# Patient Record
Sex: Female | Born: 1955
Health system: Southern US, Community
[De-identification: ages and names within clinical notes are randomized; demographics above are authoritative.]

## PROBLEM LIST (undated history)

## (undated) DIAGNOSIS — K219 Gastro-esophageal reflux disease without esophagitis: Secondary | ICD-10-CM

## (undated) DIAGNOSIS — K3189 Other diseases of stomach and duodenum: Secondary | ICD-10-CM

## (undated) DIAGNOSIS — J449 Chronic obstructive pulmonary disease, unspecified: Secondary | ICD-10-CM

## (undated) DIAGNOSIS — F32A Depression, unspecified: Secondary | ICD-10-CM

## (undated) DIAGNOSIS — I5189 Other ill-defined heart diseases: Secondary | ICD-10-CM

## (undated) DIAGNOSIS — R1310 Dysphagia, unspecified: Secondary | ICD-10-CM

## (undated) DIAGNOSIS — K209 Esophagitis, unspecified without bleeding: Secondary | ICD-10-CM

## (undated) DIAGNOSIS — K279 Peptic ulcer, site unspecified, unspecified as acute or chronic, without hemorrhage or perforation: Secondary | ICD-10-CM

## (undated) DIAGNOSIS — I499 Cardiac arrhythmia, unspecified: Secondary | ICD-10-CM

## (undated) DIAGNOSIS — T07XXXA Unspecified multiple injuries, initial encounter: Secondary | ICD-10-CM

## (undated) DIAGNOSIS — D649 Anemia, unspecified: Secondary | ICD-10-CM

## (undated) DIAGNOSIS — M1990 Unspecified osteoarthritis, unspecified site: Secondary | ICD-10-CM

## (undated) DIAGNOSIS — K311 Adult hypertrophic pyloric stenosis: Secondary | ICD-10-CM

## (undated) DIAGNOSIS — M810 Age-related osteoporosis without current pathological fracture: Secondary | ICD-10-CM

## (undated) DIAGNOSIS — K449 Diaphragmatic hernia without obstruction or gangrene: Secondary | ICD-10-CM

## (undated) HISTORY — DX: Other ill-defined heart diseases: I51.89

## (undated) HISTORY — PX: TOTAL HIP ARTHROPLASTY: SHX124

## (undated) HISTORY — DX: Peptic ulcer, site unspecified, unspecified as acute or chronic, without hemorrhage or perforation: K27.9

## (undated) HISTORY — PX: APPENDECTOMY: SHX54

## (undated) HISTORY — PX: STOMACH SURGERY: SHX791

## (undated) HISTORY — PX: CHOLECYSTECTOMY: SHX55

## (undated) HISTORY — DX: Chronic obstructive pulmonary disease, unspecified: J44.9

---

## 2016-01-01 ENCOUNTER — Encounter (INDEPENDENT_AMBULATORY_CARE_PROVIDER_SITE_OTHER): Payer: Self-pay | Admitting: *Deleted

## 2016-01-19 ENCOUNTER — Ambulatory Visit (HOSPITAL_COMMUNITY)
Admission: RE | Admit: 2016-01-19 | Discharge: 2016-01-19 | Disposition: A | Discharge status: Home / Self Care | Payer: Self-pay | Source: Ambulatory Visit | Attending: Family | Admitting: Family

## 2016-01-19 ENCOUNTER — Other Ambulatory Visit (HOSPITAL_COMMUNITY): Payer: Self-pay | Admitting: Pulmonary Disease

## 2016-01-19 ENCOUNTER — Other Ambulatory Visit (HOSPITAL_COMMUNITY): Payer: Self-pay | Admitting: Family

## 2016-01-19 DIAGNOSIS — R634 Abnormal weight loss: Secondary | ICD-10-CM

## 2016-01-19 DIAGNOSIS — F17200 Nicotine dependence, unspecified, uncomplicated: Secondary | ICD-10-CM

## 2016-01-19 DIAGNOSIS — J449 Chronic obstructive pulmonary disease, unspecified: Secondary | ICD-10-CM | POA: Insufficient documentation

## 2016-01-19 DIAGNOSIS — Z720 Tobacco use: Secondary | ICD-10-CM | POA: Insufficient documentation

## 2016-02-03 ENCOUNTER — Encounter (INDEPENDENT_AMBULATORY_CARE_PROVIDER_SITE_OTHER): Payer: Self-pay | Admitting: Internal Medicine

## 2016-02-03 ENCOUNTER — Ambulatory Visit (INDEPENDENT_AMBULATORY_CARE_PROVIDER_SITE_OTHER): Payer: Self-pay | Admitting: Internal Medicine

## 2016-02-03 ENCOUNTER — Encounter (INDEPENDENT_AMBULATORY_CARE_PROVIDER_SITE_OTHER): Payer: Self-pay | Admitting: *Deleted

## 2016-02-03 ENCOUNTER — Other Ambulatory Visit (INDEPENDENT_AMBULATORY_CARE_PROVIDER_SITE_OTHER): Payer: Self-pay | Admitting: Internal Medicine

## 2016-02-03 VITALS — BP 112/58 | HR 68 | Temp 98.7°F | Ht 62.0 in | Wt <= 1120 oz

## 2016-02-03 DIAGNOSIS — R1319 Other dysphagia: Secondary | ICD-10-CM

## 2016-02-03 DIAGNOSIS — R634 Abnormal weight loss: Secondary | ICD-10-CM

## 2016-02-03 DIAGNOSIS — R1310 Dysphagia, unspecified: Secondary | ICD-10-CM

## 2016-02-03 NOTE — Patient Instructions (Addendum)
EGD/ED. The risks and benefits such as perforation, bleeding, and infection were reviewed with the patient and is agreeable. Stop the [employer].

## 2016-02-03 NOTE — Progress Notes (Signed)
   Subjective:    Patient ID: Kristine Roberts, female    DOB: 1956/11/06, 60 y.o.   MRN: [id number]  HPI Referred by Colvin Caroli FNP-C for dysphagia. She tells me she has trouble keeping food down . She feels like foods are lodging in her esophagus and then it will come back up. She says she can eat anything she wants without any problem but it comes back up. Symptoms x 3 years. Appetite is good. She has lost about 61 pounds over the past 3 years.  In thepast month she has lost about 9 pounds. She has a hx of PUD and she says she has stomach surgery 2001 or 2002 because of ulcers by Dr. Cleotis Nipper.  She has taken Marlin Canary Powder's for years (2 a day).  She usually has a BM daily. No melena or BRRB Patient has been evaluated by a psychiatrist for possible Bulimia and was referred here to rule out PUD Chrissie Noa at [facility]). 12/29/2015 H and H 13.3 and 40.7, MCV 89.6, Platelet ct 285, Albumin 3.5, ALP 55, AST 18, ALT 11       Review of Systems Past Medical History  Diagnosis Date  . PUD (peptic ulcer disease)     Past Surgical History  Procedure Laterality Date  . Appendectomy    . Cholecystectomy    . Stomach surgery      Allergies  Allergen Reactions  . Penicillins     Tongue swells, itching    No current outpatient prescriptions on file prior to visit.   No current facility-administered medications on file prior to visit.   Current Outpatient Prescriptions  Medication Sig Dispense Refill  . albuterol (PROVENTIL HFA;VENTOLIN HFA) 108 (90 Base) MCG/ACT inhaler Inhale into the lungs every 6 (six) hours as needed for wheezing or shortness of breath.    . Aspirin-Acetaminophen-Caffeine (GOODY HEADACHE PO) Take by mouth 2 (two) times daily.    Marland Kitchen FLUoxetine (PROZAC) 40 MG capsule Take 40 mg by mouth daily.    . Iron Combinations (CHROMAGEN) capsule Take 1 capsule by mouth daily. 45    . Multiple Vitamin (MULTIVITAMIN) tablet Take 1 tablet by mouth daily.    Marland Kitchen omeprazole (PRILOSEC) 20 MG  capsule Take 20 mg by mouth daily.    . Potassium 99 MG TABS Take by mouth.    . traZODone (DESYREL) 100 MG tablet Take 100 mg by mouth at bedtime.     No current facility-administered medications for this visit.        Objective:   Physical Exam Blood pressure 112/58, pulse 68, temperature 98.7 F (37.1 C), height 5\' 2"  (1.575 m), weight 67 lb 11.2 oz (30.709 kg). Alert and oriented. Skin warm and dry. Oral mucosa is moist.   . Sclera anicteric, conjunctivae is pink. Thyroid not enlarged. No cervical lymphadenopathy. Lungs clear. Heart regular rate and rhythm.  Abdomen is soft. Bowel sounds are positive. No hepatomegaly. No abdominal masses felt. No tenderness.  No edema to lower extremities.         Assessment & Plan:  Dysphagia. ? Etiology. ? Bulimia,.Keep appt with Psychiatrist.  Will get an EGD/ED. The risks and benefits such as perforation, bleeding, and infection were reviewed with the patient and is agreeable. Needs to stop the Goody Powders.

## 2016-03-03 ENCOUNTER — Encounter (HOSPITAL_COMMUNITY): Payer: Self-pay | Admitting: *Deleted

## 2016-03-03 ENCOUNTER — Inpatient Hospital Stay (HOSPITAL_COMMUNITY)
Admission: RE | Admit: 2016-03-03 | Discharge: 2016-03-06 | DRG: 380 | Disposition: A | Discharge status: Home / Self Care | Payer: Self-pay | Source: Ambulatory Visit | Attending: Internal Medicine | Admitting: Internal Medicine

## 2016-03-03 ENCOUNTER — Encounter (HOSPITAL_COMMUNITY)
Admission: RE | Disposition: A | Discharge status: Home / Self Care | Payer: Self-pay | Source: Ambulatory Visit | Attending: Internal Medicine

## 2016-03-03 DIAGNOSIS — K311 Adult hypertrophic pyloric stenosis: Principal | ICD-10-CM | POA: Diagnosis present

## 2016-03-03 DIAGNOSIS — K6389 Other specified diseases of intestine: Secondary | ICD-10-CM | POA: Diagnosis present

## 2016-03-03 DIAGNOSIS — F17210 Nicotine dependence, cigarettes, uncomplicated: Secondary | ICD-10-CM | POA: Diagnosis present

## 2016-03-03 DIAGNOSIS — E162 Hypoglycemia, unspecified: Secondary | ICD-10-CM | POA: Diagnosis present

## 2016-03-03 DIAGNOSIS — E876 Hypokalemia: Secondary | ICD-10-CM | POA: Diagnosis present

## 2016-03-03 DIAGNOSIS — R1319 Other dysphagia: Secondary | ICD-10-CM

## 2016-03-03 DIAGNOSIS — R1310 Dysphagia, unspecified: Secondary | ICD-10-CM

## 2016-03-03 DIAGNOSIS — E43 Unspecified severe protein-calorie malnutrition: Secondary | ICD-10-CM | POA: Insufficient documentation

## 2016-03-03 DIAGNOSIS — J439 Emphysema, unspecified: Secondary | ICD-10-CM

## 2016-03-03 DIAGNOSIS — Z803 Family history of malignant neoplasm of breast: Secondary | ICD-10-CM

## 2016-03-03 DIAGNOSIS — K3189 Other diseases of stomach and duodenum: Secondary | ICD-10-CM

## 2016-03-03 DIAGNOSIS — Z903 Acquired absence of stomach [part of]: Secondary | ICD-10-CM

## 2016-03-03 DIAGNOSIS — K449 Diaphragmatic hernia without obstruction or gangrene: Secondary | ICD-10-CM

## 2016-03-03 DIAGNOSIS — Z8711 Personal history of peptic ulcer disease: Secondary | ICD-10-CM

## 2016-03-03 DIAGNOSIS — K210 Gastro-esophageal reflux disease with esophagitis: Secondary | ICD-10-CM

## 2016-03-03 DIAGNOSIS — Z980 Intestinal bypass and anastomosis status: Secondary | ICD-10-CM

## 2016-03-03 DIAGNOSIS — Z808 Family history of malignant neoplasm of other organs or systems: Secondary | ICD-10-CM

## 2016-03-03 DIAGNOSIS — Z681 Body mass index (BMI) 19 or less, adult: Secondary | ICD-10-CM

## 2016-03-03 DIAGNOSIS — K289 Gastrojejunal ulcer, unspecified as acute or chronic, without hemorrhage or perforation: Secondary | ICD-10-CM | POA: Diagnosis present

## 2016-03-03 DIAGNOSIS — J449 Chronic obstructive pulmonary disease, unspecified: Secondary | ICD-10-CM

## 2016-03-03 HISTORY — PX: ESOPHAGEAL DILATION: SHX303

## 2016-03-03 HISTORY — PX: ESOPHAGOGASTRODUODENOSCOPY: SHX5428

## 2016-03-03 LAB — COMPREHENSIVE METABOLIC PANEL
ALBUMIN: 2.7 g/dL — AB (ref 3.5–5.0)
ALK PHOS: 42 U/L (ref 38–126)
ALT: 13 U/L — AB (ref 14–54)
AST: 18 U/L (ref 15–41)
Anion gap: 9 (ref 5–15)
BILIRUBIN TOTAL: 0.7 mg/dL (ref 0.3–1.2)
BUN: 15 mg/dL (ref 6–20)
CALCIUM: 7.9 mg/dL — AB (ref 8.9–10.3)
CO2: 29 mmol/L (ref 22–32)
CREATININE: 0.42 mg/dL — AB (ref 0.44–1.00)
Chloride: 100 mmol/L — ABNORMAL LOW (ref 101–111)
GFR calc Af Amer: >60 mL/min (ref >60)
GFR calc non Af Amer: >60 mL/min (ref >60)
GLUCOSE: 72 mg/dL (ref 65–99)
Potassium: 2.4 mmol/L — CL (ref 3.5–5.1)
SODIUM: 138 mmol/L (ref 135–145)
TOTAL PROTEIN: 5.5 g/dL — AB (ref 6.5–8.1)

## 2016-03-03 LAB — MAGNESIUM: Magnesium: 1.9 mg/dL (ref 1.7–2.4)

## 2016-03-03 SURGERY — EGD (ESOPHAGOGASTRODUODENOSCOPY)
Anesthesia: Moderate Sedation

## 2016-03-03 MED ORDER — ALBUTEROL SULFATE (2.5 MG/3ML) 0.083% IN NEBU: 2.5000 mg | INHALATION_SOLUTION | Freq: Four times a day (QID) | RESPIRATORY_TRACT | Status: DC | PRN

## 2016-03-03 MED ORDER — MIDAZOLAM HCL 5 MG/5ML IJ SOLN
INTRAMUSCULAR | Status: DC | PRN
  Administered 2016-03-03 (×2): 2 mg via INTRAVENOUS

## 2016-03-03 MED ORDER — ACETAMINOPHEN 325 MG PO TABS
650.0000 mg | ORAL_TABLET | Freq: Four times a day (QID) | ORAL | Status: DC | PRN
  Administered 2016-03-03: 650 mg via ORAL
  Filled 2016-03-03: qty 2

## 2016-03-03 MED ORDER — POTASSIUM CHLORIDE 10 MEQ/100ML IV SOLN
10.0000 meq | INTRAVENOUS | Status: AC
  Administered 2016-03-03 – 2016-03-04 (×6): 10 meq via INTRAVENOUS
  Filled 2016-03-03 (×5): qty 100

## 2016-03-03 MED ORDER — ONDANSETRON HCL 4 MG/2ML IJ SOLN: 4.0000 mg | Freq: Four times a day (QID) | INTRAMUSCULAR | Status: DC | PRN

## 2016-03-03 MED ORDER — ONDANSETRON HCL 4 MG PO TABS: 4.0000 mg | ORAL_TABLET | Freq: Four times a day (QID) | ORAL | Status: DC | PRN

## 2016-03-03 MED ORDER — ENOXAPARIN SODIUM 30 MG/0.3ML ~~LOC~~ SOLN
30.0000 mg | SUBCUTANEOUS | Status: DC
  Administered 2016-03-03: 30 mg via SUBCUTANEOUS
  Filled 2016-03-03: qty 0.3

## 2016-03-03 MED ORDER — MEPERIDINE HCL 50 MG/ML IJ SOLN
INTRAMUSCULAR | Status: DC | PRN
  Administered 2016-03-03 (×2): 25 mg via INTRAVENOUS

## 2016-03-03 MED ORDER — MEPERIDINE HCL 50 MG/ML IJ SOLN
INTRAMUSCULAR | Status: AC
  Filled 2016-03-03: qty 1

## 2016-03-03 MED ORDER — SODIUM CHLORIDE 0.9 % IV SOLN
INTRAVENOUS | Status: DC
  Administered 2016-03-03: 14:00:00 via INTRAVENOUS

## 2016-03-03 MED ORDER — SODIUM CHLORIDE 0.9 % IV SOLN
INTRAVENOUS | Status: DC
  Administered 2016-03-03: 21:00:00 via INTRAVENOUS

## 2016-03-03 MED ORDER — BUTAMBEN-TETRACAINE-BENZOCAINE 2-2-14 % EX AERO
INHALATION_SPRAY | CUTANEOUS | Status: DC | PRN
  Administered 2016-03-03: 2 via TOPICAL

## 2016-03-03 MED ORDER — PANTOPRAZOLE SODIUM 40 MG IV SOLR
40.0000 mg | Freq: Two times a day (BID) | INTRAVENOUS | Status: DC
  Administered 2016-03-03 – 2016-03-05 (×4): 40 mg via INTRAVENOUS
  Filled 2016-03-03 (×4): qty 40

## 2016-03-03 MED ORDER — ACETAMINOPHEN 650 MG RE SUPP: 650.0000 mg | Freq: Four times a day (QID) | RECTAL | Status: DC | PRN

## 2016-03-03 MED ORDER — MIDAZOLAM HCL 5 MG/5ML IJ SOLN
INTRAMUSCULAR | Status: AC
  Filled 2016-03-03: qty 10

## 2016-03-03 MED ORDER — ALBUTEROL SULFATE HFA 108 (90 BASE) MCG/ACT IN AERS: 2.0000 | INHALATION_SPRAY | Freq: Four times a day (QID) | RESPIRATORY_TRACT | Status: DC | PRN

## 2016-03-03 NOTE — Progress Notes (Signed)
Nursing report called to Johnsie Cancel, RN. Patient admitted to room 335.

## 2016-03-03 NOTE — H&P (Signed)
Kristine Roberts is an 60 y.o. female.   Chief Complaint: Patient is here for EGD and edition HPI: Patient is 60 year old Caucasian female who presents for several month history of dysphagia to solids. She also has been losing weight for the last 3 years. She denies hematemesis melena or rectal bleeding. She has intermittent epigastric pain. She states she has good appetite but she cannot eat. She has remote history of peptic ulcer disease for which she had surgery. She is still taking BC or Goody powder usually 2 a day. She smokes about a pack of cigarettes per day and drinks alcohol socially but not every day.  Past Medical History  Diagnosis Date  . PUD (peptic ulcer disease)   . COPD (chronic obstructive pulmonary disease) [facility])     Past Surgical History  Procedure Laterality Date  . Appendectomy    . Cholecystectomy    . Stomach surgery      History reviewed. No pertinent family history. Social History:  reports that she has been smoking Cigarettes.  She has smoked for the past 20 years. She does not have any smokeless tobacco history on file. She reports that she drinks about 7.2 oz of alcohol per week. She reports that she does not use illicit drugs.  Allergies:  Allergies  Allergen Reactions  . Penicillins     Tongue swells, itching Has patient had a PCN reaction causing immediate rash, facial/tongue/throat swelling, SOB or lightheadedness with hypotension: Yes, had a rash and tongue swelled up. No SOB and lightheadedness. Has patient had a PCN reaction causing severe rash involving mucus membranes or skin necrosis: No Has patient had a PCN reaction that required hospitalization: No Has patient had a PCN reaction occurring within the last 10 years: No If all of the above answers are "NO", then may procee    Medications Prior to Admission  Medication Sig Dispense Refill  . albuterol (PROVENTIL HFA;VENTOLIN HFA) 108 (90 Base) MCG/ACT inhaler Inhale into the lungs every 6 (six)  hours as needed for wheezing or shortness of breath.    . Aspirin-Acetaminophen-Caffeine (GOODY HEADACHE PO) Take 1 tablet by mouth 2 (two) times daily.     Marland Kitchen FLUoxetine (PROZAC) 40 MG capsule Take 40 mg by mouth daily.    Marland Kitchen omeprazole (PRILOSEC) 20 MG capsule Take 20 mg by mouth daily.    . Potassium 99 MG TABS Take 99 mg by mouth daily.     Marland Kitchen tetrahydrozoline 0.05 % ophthalmic solution Place 1 drop into both eyes daily as needed (dry eyes).    . traZODone (DESYREL) 100 MG tablet Take 100 mg by mouth at bedtime.    . Iron Combinations (CHROMAGEN) capsule Take 1 capsule by mouth daily. 45    . Multiple Vitamin (MULTIVITAMIN) tablet Take 1 tablet by mouth daily.      No results found for this or any previous visit (from the past 48 hour(s)). No results found.  ROS  Blood pressure 103/70, pulse 65, temperature 98.6 F (37 C), temperature source Oral, resp. rate 12, height 5\' 2"  (1.575 m), weight 67 lb (30.391 kg), SpO2 100 %. Physical Exam  Constitutional:  Patient is emaciated.  HENT:  Mouth/Throat: Oropharynx is clear and moist.  Patient is edentulous.  Eyes: No scleral icterus.  Neck: No thyromegaly present.  Cardiovascular: Normal rate, regular rhythm and normal heart sounds.   No murmur heard. Respiratory: Effort normal and breath sounds normal.  GI:  Scaphoid abdomen with right subcostal and midline scar and a  palpable suture. Mild tenderness noted epigastrium to the right of midline.  Musculoskeletal: She exhibits no edema.  Lymphadenopathy:    She has no cervical adenopathy.  Neurological: She is alert.  Skin: Skin is warm and dry.     Assessment/Plan Solid food dysphagia. 30 pound weight loss. She is emaciated. EGD and ED.  Malissa Hippo, MD 03/03/2016, 3:37 PM

## 2016-03-03 NOTE — H&P (Signed)
PCP:   Jerrell Belfast, FNP   Chief Complaint:  Dysphagia  HPI:  60 yr old female who   has a past medical history of PUD (peptic ulcer disease) and COPD (chronic obstructive pulmonary disease) (HCC). today was sent to the hospital from endoscopy suite, where patient underwent EGD for chronic dysphagia and weight loss. EGD revealed extensive changes of erosive esophagitis involving distal two thirds of esophagus, small sliding hiatal hernia, gastrojejunostomy with high-grade anastomotic stricture with circumferential ulcer and stomach full of food debris. As per patient she has been having symptoms of dysphagia for past 2 years, she was able to swallow liquid diet most of the times but regurgitated solid food most of the time. She has lost considerable weight and only weighs 63 pounds. She denies chest pain or shortness of breath. She does smoke one pill of cigarettes a day. Denies any abdominal pain. No diarrhea. No fever or chills. No dysuria urgency frequency of urination.   Allergies:   Allergies  Allergen Reactions  . Penicillins           Past Medical History  Diagnosis Date  . PUD (peptic ulcer disease)   . COPD (chronic obstructive pulmonary disease) [facility])     Past Surgical History  Procedure Laterality Date  . Appendectomy    . Cholecystectomy    . Stomach surgery      Prior to Admission medications   Medication Sig Start Date End Date Taking? Authorizing Provider  albuterol (PROVENTIL HFA;VENTOLIN HFA) 108 (90 Base) MCG/ACT inhaler Inhale into the lungs every 6 (six) hours as needed for wheezing or shortness of breath.   Yes Historical Provider, MD  Aspirin-Acetaminophen-Caffeine (GOODY HEADACHE PO) Take 1 tablet by mouth 2 (two) times daily.    Yes Historical Provider, MD  FLUoxetine (PROZAC) 40 MG capsule Take 40 mg by mouth daily.   Yes Historical Provider, MD  omeprazole (PRILOSEC) 20 MG capsule Take 20 mg by mouth daily.   Yes Historical Provider, MD    Potassium 99 MG TABS Take 99 mg by mouth daily.    Yes Historical Provider, MD  tetrahydrozoline 0.05 % ophthalmic solution Place 1 drop into both eyes daily as needed (dry eyes).   Yes Historical Provider, MD  traZODone (DESYREL) 100 MG tablet Take 100 mg by mouth at bedtime.   Yes Historical Provider, MD  Iron Combinations (CHROMAGEN) capsule Take 1 capsule by mouth daily. 45    Historical Provider, MD  Multiple Vitamin (MULTIVITAMIN) tablet Take 1 tablet by mouth daily.    Historical Provider, MD    Social History:  reports that she has been smoking Cigarettes.  She has smoked for the past 20 years. She does not have any smokeless tobacco history on file. She reports that she drinks about 7.2 oz of alcohol per week. She reports that she does not use illicit drugs.  Patient's father had throat cancer, sister had breast cancer  Filed Weights   03/03/16 1326 03/03/16 1741  Weight: 30.391 kg (67 lb) 28.577 kg (63 lb)    All the positives are listed in BOLD  Review of Systems:  HEENT: Headache, blurred vision, runny nose, sore throat Neck: Hypothyroidism, hyperthyroidism,,lymphadenopathy Chest : Shortness of breath, history of COPD, Asthma Heart : Chest pain, history of coronary arterey disease GI:  Nausea, vomiting, diarrhea, constipation, GERD GU: Dysuria, urgency, frequency of urination, hematuria Neuro: Stroke, seizures, syncope Psych: Depression, anxiety, hallucinations   Physical Exam: Blood pressure 88/53, pulse 57, temperature 98.6  F (37 C), temperature source Oral, resp. rate 14, height 5\' 2"  (1.575 m), weight 28.577 kg (63 lb), SpO2 95 %. Constitutional:   Patient is a well-developed and well-nourished female in no acute distress and cooperative with exam. Head: Normocephalic and atraumatic Mouth: Mucus membranes moist Eyes: PERRL, EOMI, conjunctivae normal Neck: Supple, No Thyromegaly Cardiovascular: RRR, S1 normal, S2 normal Pulmonary/Chest: CTAB, no wheezes,  rales, or rhonchi Abdominal: Soft. Non-tender, non-distended, bowel sounds are normal, no masses, organomegaly, or guarding present.  Neurological: A&O x3, Strength is normal and symmetric bilaterally, cranial nerve II-XII are grossly intact, no focal motor deficit, sensory intact to light touch bilaterally.  Extremities : No Cyanosis, Clubbing or Edema   Assessment/Plan Active Problems:   Dysphagia   Gastric outlet obstruction   COPD (chronic obstructive pulmonary disease) (HCC)   Dysphagia Patient has gastric outlet obstruction, GI he has been following. Plan is to keep patient nothing by mouth, NG tube with low intermittent suction. GI to follow in a.m. For Gastrografin study via NG tube to determine if anastomotic stricture could be be dilated endoscopically.  COPD  Stable, no exacerbation at this time. Continue albuterol when necessary  DVT prophylaxis Lovenox   Code status: Full code  Family discussion: No family present at bedside  Time Spent on Admission: 60 min  [facility] Pager: [phone] 03/03/2016, 6:21 PM  If 7PM-7AM, please contact night-coverage  [email or web address]  Password [email or web address]

## 2016-03-03 NOTE — Progress Notes (Signed)
Patient underwent EGD for chronic dysphagia and weight loss EGD reveals extensive changes of erosive esophagitis involving distal two thirds of the esophagus, small sliding hiatal hernia, gastrojejunostomy with high-grade anastomotic stricture with circumferential ulcer and stomach full of food debris some of which was suctioned out. Patient has been admitted to hospitalist service.  Recommendations:  NG suction to low intermittent pressure> Pantoprazole 40 mg IV every 12 hours. Gastrografin study via NG tube tomorrow to determine if anastomotic stricture could be dilated endoscopically.  Discussed with Dr. Sharl Ma.

## 2016-03-03 NOTE — Progress Notes (Signed)
Patient has requested her trazodone that she takes at home for sleep, paged on call MD, will follow any new orders received and continue to monitor the patient.

## 2016-03-03 NOTE — Op Note (Signed)
[facility] Patient Name: Kristine Roberts Procedure Date: 03/03/2016 3:31 PM MRN: [id number] Date of Birth: 29-Jan-1956 Attending MD: Lionel December , MD CSN: [id number] Age: 60 Admit Type: Outpatient Procedure:                Upper GI endoscopy Indications:              Esophageal dysphagia, Weight loss Providers:                Lionel December, MD, Jannett Celestine, RN, Burke Keels,                            Technician Referring MD:             Selinda Flavin, M.D [facility] health                            Department). Medicines:                Cetacaine spray, Meperidine 50 mg IV, Midazolam 4                            mg IV Complications:            No immediate complications. Estimated Blood Loss:     Estimated blood loss: none. Procedure:                Pre-Anesthesia Assessment:                           - Prior to the procedure, a History and Physical                            was performed, and patient medications and                            allergies were reviewed. The patient's tolerance of                            previous anesthesia was also reviewed. The risks                            and benefits of the procedure and the sedation                            options and risks were discussed with the patient.                            All questions were answered, and informed consent                            was obtained. Prior Anticoagulants: The patient                            last took previous NSAID medication 1 day prior to  the procedure. ASA Grade Assessment: III - A                            patient with severe systemic disease. After                            reviewing the risks and benefits, the patient was                            deemed in satisfactory condition to undergo the                            procedure.                           After obtaining informed consent, the endoscope was                             passed under direct vision. Throughout the                            procedure, the patient's blood pressure, pulse, and                            oxygen saturations were monitored continuously. The                            EG-[id number] ([id number]) scope was introduced through the                            mouth, and advanced to the prepyloric region,                            stomach. The EG-[id number] ([id number]) scope was                            introduced through the mouth, and advanced to the.                            The upper GI endoscopy was accomplished without                            difficulty. The patient tolerated the procedure                            well. Scope In: 3:49:34 PM Scope Out: 4:01:13 PM Total Procedure Duration: 0 hours 11 minutes 39 seconds  Findings:      The upper third of the esophagus was normal.      LA Grade D (one or more mucosal breaks involving at least 75% of       esophageal circumference) esophagitis with no bleeding was found 24 to       33 cm from the incisors.      A 3 cm hiatal hernia was present.      A medium amount  of food (residue) was found in the gastric fundus, in       the gastric body and in the gastric antrum.      One non-bleeding circumferential gastric ulcer with no stigmata of       bleeding was found at the anastomosis.      Evidence of a stenosed Billroth I gastroduodenostomy was found. A       gastric pouch greater than 10 cm wide with a large size was found       containing food debris. The gastroduodenal anastomosis was characterized       by friable mucosa, severe stenosis and ulceration. This could not be       traversed.      An examination of the duodenum was not performed. Impression:               - Normal upper third of esophagus.                           - LA Grade D reflux esophagitis.                           - 3 cm hiatal hernia.                           - A medium amount of food (residue) in the  stomach.                           - Stenosed Billroth I gastroduodenostomy was found,                            characterized by friable mucosa, ulceration and                            severe stenosis.                           -could not traverse stricture with pediatric                            endoscope.                           - No specimens collected. Moderate Sedation:      Moderate (conscious) sedation was administered by the endoscopy nurse       and supervised by the endoscopist. The following parameters were       monitored: oxygen saturation, heart rate, blood pressure, CO2       capnography and response to care. Total physician intraservice time was       15 minutes. Recommendation:           - Admit the patient to hospital ward for ongoing                            care.                           - Make the patient NPO starting today.                           -  Discontinue aspirin and NSAIDs indefinitely. Procedure Code(s):        --- Professional ---                           [phone], Esophagogastroduodenoscopy, flexible,                            transoral; diagnostic, including collection of                            specimen(s) by brushing or washing, when performed                            (separate procedure)                           99152, Moderate sedation services provided by the                            same physician or other qualified health care                            professional performing the diagnostic or                            therapeutic service that the sedation supports,                            requiring the presence of an independent trained                            observer to assist in the monitoring of the                            patient's level of consciousness and physiological                            status; initial 15 minutes of intraservice time,                            patient age 44 years or  older Diagnosis Code(s):        --- Professional ---                           K21.0, Gastro-esophageal reflux disease with                            esophagitis                           K44.9, Diaphragmatic hernia without obstruction or                            gangrene  Z98.0, Intestinal bypass and anastomosis status                           R13.14, Dysphagia, pharyngoesophageal phase                           R63.4, Abnormal weight loss CPT copyright 2016 American Medical Association. All rights reserved. The codes documented in this report are preliminary and upon coder review may  be revised to meet current compliance requirements. Lionel December, MD Lionel December, MD 03/03/2016 4:33:49 PM This report has been signed electronically. Number of Addenda: 0

## 2016-03-04 ENCOUNTER — Observation Stay (HOSPITAL_COMMUNITY): Payer: Self-pay

## 2016-03-04 DIAGNOSIS — E43 Unspecified severe protein-calorie malnutrition: Secondary | ICD-10-CM

## 2016-03-04 DIAGNOSIS — R1310 Dysphagia, unspecified: Secondary | ICD-10-CM

## 2016-03-04 DIAGNOSIS — K311 Adult hypertrophic pyloric stenosis: Principal | ICD-10-CM

## 2016-03-04 DIAGNOSIS — J449 Chronic obstructive pulmonary disease, unspecified: Secondary | ICD-10-CM

## 2016-03-04 LAB — COMPREHENSIVE METABOLIC PANEL
ALT: 12 U/L — AB (ref 14–54)
AST: 19 U/L (ref 15–41)
Albumin: 2.8 g/dL — ABNORMAL LOW (ref 3.5–5.0)
Alkaline Phosphatase: 43 U/L (ref 38–126)
Anion gap: 11 (ref 5–15)
BUN: 12 mg/dL (ref 6–20)
CALCIUM: 8.2 mg/dL — AB (ref 8.9–10.3)
CO2: 26 mmol/L (ref 22–32)
CREATININE: 0.38 mg/dL — AB (ref 0.44–1.00)
Chloride: 102 mmol/L (ref 101–111)
GFR calc Af Amer: >60 mL/min (ref >60)
GLUCOSE: 58 mg/dL — AB (ref 65–99)
Potassium: 4 mmol/L (ref 3.5–5.1)
Sodium: 139 mmol/L (ref 135–145)
TOTAL PROTEIN: 5.6 g/dL — AB (ref 6.5–8.1)
Total Bilirubin: 0.6 mg/dL (ref 0.3–1.2)

## 2016-03-04 LAB — CBC
HCT: 38.2 % (ref 36.0–46.0)
Hemoglobin: 12.4 g/dL (ref 12.0–15.0)
MCH: 30.9 pg (ref 26.0–34.0)
MCHC: 32.5 g/dL (ref 30.0–36.0)
MCV: 95.3 fL (ref 78.0–100.0)
PLATELETS: 232 10*3/uL (ref 150–400)
RBC: 4.01 MIL/uL (ref 3.87–5.11)
RDW: 15.9 % — ABNORMAL HIGH (ref 11.5–15.5)
WBC: 6.6 10*3/uL (ref 4.0–10.5)

## 2016-03-04 LAB — GLUCOSE, CAPILLARY
GLUCOSE-CAPILLARY: 115 mg/dL — AB (ref 65–99)
GLUCOSE-CAPILLARY: 81 mg/dL (ref 65–99)
Glucose-Capillary: 175 mg/dL — ABNORMAL HIGH (ref 65–99)
Glucose-Capillary: 45 mg/dL — ABNORMAL LOW (ref 65–99)

## 2016-03-04 MED ORDER — DEXTROSE-NACL 5-0.9 % IV SOLN
INTRAVENOUS | Status: DC
  Administered 2016-03-04 (×2): via INTRAVENOUS

## 2016-03-04 MED ORDER — IOPAMIDOL (ISOVUE-300) INJECTION 61%
INTRAVENOUS | Status: AC
  Filled 2016-03-04: qty 300

## 2016-03-04 MED ORDER — DEXTROSE 50 % IV SOLN
INTRAVENOUS | Status: AC
  Filled 2016-03-04: qty 50

## 2016-03-04 MED ORDER — MORPHINE SULFATE (PF) 2 MG/ML IV SOLN
0.5000 mg | INTRAVENOUS | Status: DC | PRN
  Administered 2016-03-04 – 2016-03-06 (×9): 0.5 mg via INTRAVENOUS
  Filled 2016-03-04 (×9): qty 1

## 2016-03-04 MED ORDER — DEXTROSE 50 % IV SOLN
1.0000 | Freq: Once | INTRAVENOUS | Status: AC
  Administered 2016-03-04: 50 mL via INTRAVENOUS

## 2016-03-04 MED ORDER — IOPAMIDOL (ISOVUE-300) INJECTION 61%
150.0000 mL | Freq: Once | INTRAVENOUS | Status: AC | PRN
  Administered 2016-03-04: 120 mL via INTRAVENOUS

## 2016-03-04 NOTE — Progress Notes (Signed)
Patient ID: Kristine Roberts, female   DOB: 1956-07-05, 60 y.o.   MRN: [id number] Alert. NG in place. Tolerating clear liquids. No vomiting. Underwent an EGD yesterday   Gatro. Study thru tube ordered this am. Repeat EGD tomorrow. Marland Kitchen

## 2016-03-04 NOTE — Care Management Note (Signed)
Case Management Note  Patient Details  Name: Kristine Roberts MRN: [id number] Date of Birth: August 16, 1956  Subjective/Objective:     Spoke with patient who is from home with boyfriend, who drives her to her appointments. Patient stated that she is only on 3 medications and that she can afford these. Lives in 1 level house. [facility] she sees Dr Collins [place].  Patietn denies the use of DME at home and stated that she is "steady on her feet".   May need help with medications at discharge. Not on O2.               Action/Plan:  Home with self care.    Expected Discharge Date:                  Expected Discharge Plan:  Home/Self Care  In-House Referral:  Financial Counselor  Discharge planning Services  CM Consult  Post Acute Care Choice:    Choice offered to:     DME Arranged:    DME Agency:     HH Arranged:    HH Agency:     Status of Service:  In process, will continue to follow  Medicare Important Message Given:    Date Medicare IM Given:    Medicare IM give by:    Date Additional Medicare IM Given:    Additional Medicare Important Message give by:     If discussed at Long Length of Stay Meetings, dates discussed:    Additional Comments:  Adonis Huguenin, RN 03/04/2016, 9:16 AM

## 2016-03-04 NOTE — Progress Notes (Signed)
[facility] PROGRESS NOTE  Kristine Roberts VOZ:[id number] DOB: [birth year] DOA: 03/03/2016 PCP: Jerrell Belfast, FNP  Assessment/Plan: 60 yr old female who  has a past medical history of PUD (peptic ulcer disease) and COPD (chronic obstructive pulmonary disease) (HCC). today was sent to the hospital from endoscopy suite, where patient underwent EGD for chronic dysphagia and weight loss. EGD revealed extensive changes of erosive esophagitis involving distal two thirds of esophagus, small sliding hiatal hernia, gastrojejunostomy with high-grade anastomotic stricture with circumferential ulcer and stomach full of food debris.  Dysphagia; S/P endoscopy 3-29: - Normal upper third of esophagus.  LA Grade D reflux esophagitis. - 3 cm hiatal hernia. - A medium amount of food (residue) in the stomach. - Stenosed Billroth I gastroduodenostomy was found, characterized by friable mucosa, ulceration and severe stenosis.  -Patient has gastric outlet obstruction, GI he has been following. -Plan is to keep patient nothing by mouth, NG tube with low intermittent suction. -For Gastrografin study via NG tube to determine if anastomotic stricture could be be dilated endoscopically.  Hypoglycemia;  Received Amp D 50.  IV fluids change to D 5.   Hypokalemia; Treated with IV kcl.  Resolved.   COPD  Stable, no exacerbation at this time. Continue albuterol PRN>   Malnutrition in content of dysphagia; Weight loss.  Nutritionist consulted.   DVT prophylaxis Lovenox   Code Status: Full Code.  Family Communication: care discussed with patient.  Disposition Plan; remain inpatient.    Consultants:  GI.   Procedures:  Endoscopy.   Antibiotics: None  HPI/Subjective: Had a BM today. Relates mild abdominal pain.  Complaining of headache.    Objective: Filed Vitals:   03/03/16 2100 03/04/16 0500  BP: 92/60 106/64  Pulse: 59 58  Temp: 98.2 F (36.8 C) 98.5 F (36.9 C)  Resp: 16 16     Intake/Output Summary (Last 24 hours) at 03/04/16 0739 Last data filed at 03/04/16 0534  Gross per 24 hour  Intake    900 ml  Output   1100 ml  Net   -200 ml   Filed Weights   03/03/16 1326 03/03/16 1741  Weight: 30.391 kg (67 lb) 28.577 kg (63 lb)    Exam:   General:  Cachetic, NAD, NG tube in place.   Cardiovascular: S 1, S 2 RRR  Respiratory: CTA  Abdomen: bs present, soft,   Musculoskeletal: no edema  Data Reviewed: Basic Metabolic Panel:  Recent Labs Lab 03/03/16 2121 03/04/16 0619  NA 138 139  K 2.4* 4.0  CL 100* 102  CO2 29 26  GLUCOSE 72 58*  BUN 15 12  CREATININE 0.42* 0.38*  CALCIUM 7.9* 8.2*  MG 1.9  --    Liver Function Tests:  Recent Labs Lab 03/03/16 2121 03/04/16 0619  AST 18 19  ALT 13* 12*  ALKPHOS 42 43  BILITOT 0.7 0.6  PROT 5.5* 5.6*  ALBUMIN 2.7* 2.8*   No results for input(s): LIPASE, AMYLASE in the last 168 hours. No results for input(s): AMMONIA in the last 168 hours. CBC: No results for input(s): WBC, NEUTROABS, HGB, HCT, MCV, PLT in the last 168 hours. Cardiac Enzymes: No results for input(s): CKTOTAL, CKMB, CKMBINDEX, TROPONINI in the last 168 hours. BNP (last 3 results) No results for input(s): BNP in the last 8760 hours.  ProBNP (last 3 results) No results for input(s): PROBNP in the last 8760 hours.  CBG: No results for input(s): GLUCAP in the last 168 hours.  No results found for  this or any previous visit (from the past 240 hour(s)).   Studies: No results found.  Scheduled Meds: . enoxaparin (LOVENOX) injection  30 mg Subcutaneous Q24H  . pantoprazole (PROTONIX) IV  40 mg Intravenous Q12H   Continuous Infusions: . sodium chloride 20 mL/hr at 03/03/16 1337  . sodium chloride 75 mL/hr at 03/03/16 2040    Active Problems:   Dysphagia   Gastric outlet obstruction   COPD (chronic obstructive pulmonary disease) (HCC)    Time spent: 35 minutes.     Hartley Barefoot A  [facility] Pager [phone]. If 7PM-7AM, please contact night-coverage at [email or web address], password [facility] 03/04/2016, 7:39 AM

## 2016-03-04 NOTE — Progress Notes (Signed)
Orders received,and given CBG recheck was 175. Patient alert,and oriented.No c/o pain or discomfort noted. Will continue to monitor patient.

## 2016-03-04 NOTE — Progress Notes (Addendum)
Gastrografin study reviewed with patient. She has high-grade anastomotic stricture. Symptomatically she is feeling better with NG suction. He will undergo EGD under propofol with stricture dilation under fluoroscopy tomorrow. Will hold Lovenox dose tonight

## 2016-03-04 NOTE — Progress Notes (Signed)
CRITICAL VALUE ALERT  Critical value received: CBG 45  Date of notification:  03/04/2016  Time of notification:  808  Critical value read back:Yes.    Nurse who received alert:  Billie Lade  MD notified (1st page):  Dr Sunnie Nielsen  Time of first page:  0808  MD notified (2nd page):  Time of second page:  Responding MD:  Dr Sunnie Nielsen  Time MD responded:  [phone]

## 2016-03-04 NOTE — Progress Notes (Addendum)
Initial Nutrition Assessment  DOCUMENTATION CODES:  Underweight, Severe malnutrition in context of chronic illness   Pt meets criteria for SEVERE MALNUTRITION in the context of Chronic Illness as evidenced by Severe muscle/fat wasting and an estimated energy intake that met < or equal to 75% of needs for > or equal to 1 month.  INTERVENTION:  Once diet is advanced, recommend GI SOFT and a slow reintroduction of adequate amount of food . Very slow rate of wt loss, still Somewhat at risk refeeding syndrome as she has essentially been on a low volume, liquid diet for 3 years. (start < / = 1000 calories per day or 35 kcal/kg) Fiber would likely not be well tolerated   RD to follow and help with normal diet reintroduction once able.   RD to add supplements/snacks as needed once able  NUTRITION DIAGNOSIS:  Unintentional weight loss related to altered GI function as evidenced by severe depletion of muscle mass, severe depletion of body fat.  GOAL:  Patient will meet greater than or equal to 90% of their needs  MONITOR:  Diet advancement, Labs, I & O's  REASON FOR ASSESSMENT:  Consult Assessment of nutrition requirement/status  ASSESSMENT:  60 y/o female PMHx PUD and COPD who presented for EGD due to several month history of dysphagia to solids and a 3 year history of weight loss. EGD revealed erosive gastritis and gastroduodenal anastomotic stricture (hx billroth 1). NG placed to suction and admitted for treatment of GOO.   Pt reports she originally had her billroth 1 done due to severe peptic ulcers. Her Pt reports that her appetite has really not decreased at all, rather whatever she ate just would not stay down. She states she had 4-5 episodes of regurgitation daily. She had dysphagia to all solids and even thicker liquids. The thin liquids ie soda, juice, some sounds to be all that could pass through. Fortunately, she was drinking Oral nutritional supplements. She would consume boost 1-2x  a day. She was taking an mvi with minerals, but stopped once she started consuming the boost  She reports that her weight prior to this 3 year period of intolerance, was ~120 lbs. She states the majority of the 60 lbs she has lost, the majority of it (30-40 lbs) has been in this last year alone as her symptoms have worsened.   Despite very poor intake, pt reports that, functionally, she has not been hindered. She says she was active and up-and-about.   Pt currently NPO in anticipation of Gastrografin study via NG tube to determine if anastomotic stricture could be be dilated endoscopically.  Unable to provide interventions at this time. Pt was agreeable to supplements once her diet was advanced.   Denies any c/d/   NFPE: Severe fat/muscle wasting  Labs reviewed: hypoglycemic episode, hypoalbuminemia, low total protein.   Diet Order:  Diet NPO time specified  Skin:  Dry, Scabs  Last BM:  3/28  Height:  Ht Readings from Last 1 Encounters:  03/03/16 5' 2"  (1.575 m)   Weight:  Wt Readings from Last 1 Encounters:  03/03/16 63 lb (28.577 kg)   Wt Readings from Last 10 Encounters:  03/03/16 63 lb (28.577 kg)  02/03/16 67 lb 11.2 oz (30.709 kg)   Ideal Body Weight:  50 kg  BMI:  Body mass index is 11.52 kg/(m^2).  Estimated Nutritional Needs:  Kcal:  Less than or equal to 1000 calories to start (35 kcal/kg bw) Protein:  40-50 g (1.4-1.8 kcal/kg bw) Fluid:  1.2 liters  EDUCATION NEEDS:  No education needs identified at this time  Burtis Junes RD, LDN Clinical Nutrition Pager: [phone] 03/04/2016 5:17 PM

## 2016-03-05 ENCOUNTER — Inpatient Hospital Stay (HOSPITAL_COMMUNITY): Payer: Self-pay | Admitting: Anesthesiology

## 2016-03-05 ENCOUNTER — Encounter (HOSPITAL_COMMUNITY): Payer: Self-pay | Admitting: *Deleted

## 2016-03-05 ENCOUNTER — Encounter (HOSPITAL_COMMUNITY)
Admission: RE | Disposition: A | Discharge status: Home / Self Care | Payer: Self-pay | Source: Ambulatory Visit | Attending: Internal Medicine

## 2016-03-05 ENCOUNTER — Inpatient Hospital Stay (HOSPITAL_COMMUNITY): Payer: Self-pay

## 2016-03-05 DIAGNOSIS — E43 Unspecified severe protein-calorie malnutrition: Secondary | ICD-10-CM | POA: Insufficient documentation

## 2016-03-05 DIAGNOSIS — K210 Gastro-esophageal reflux disease with esophagitis: Secondary | ICD-10-CM

## 2016-03-05 DIAGNOSIS — K3189 Other diseases of stomach and duodenum: Secondary | ICD-10-CM

## 2016-03-05 DIAGNOSIS — K9189 Other postprocedural complications and disorders of digestive system: Secondary | ICD-10-CM

## 2016-03-05 DIAGNOSIS — K449 Diaphragmatic hernia without obstruction or gangrene: Secondary | ICD-10-CM

## 2016-03-05 HISTORY — PX: ESOPHAGOGASTRODUODENOSCOPY (EGD) WITH PROPOFOL: SHX5813

## 2016-03-05 HISTORY — PX: BALLOON DILATION: SHX5330

## 2016-03-05 LAB — CBC
HEMATOCRIT: 37.4 % (ref 36.0–46.0)
Hemoglobin: 12 g/dL (ref 12.0–15.0)
MCH: 30.6 pg (ref 26.0–34.0)
MCHC: 32.1 g/dL (ref 30.0–36.0)
MCV: 95.4 fL (ref 78.0–100.0)
PLATELETS: 222 10*3/uL (ref 150–400)
RBC: 3.92 MIL/uL (ref 3.87–5.11)
RDW: 15.4 % (ref 11.5–15.5)
WBC: 4.8 10*3/uL (ref 4.0–10.5)

## 2016-03-05 LAB — BASIC METABOLIC PANEL
ANION GAP: 8 (ref 5–15)
BUN: 7 mg/dL (ref 6–20)
CHLORIDE: 103 mmol/L (ref 101–111)
CO2: 27 mmol/L (ref 22–32)
Calcium: 8 mg/dL — ABNORMAL LOW (ref 8.9–10.3)
Creatinine, Ser: 0.35 mg/dL — ABNORMAL LOW (ref 0.44–1.00)
GFR calc Af Amer: >60 mL/min (ref >60)
GFR calc non Af Amer: >60 mL/min (ref >60)
Glucose, Bld: 107 mg/dL — ABNORMAL HIGH (ref 65–99)
POTASSIUM: 3 mmol/L — AB (ref 3.5–5.1)
Sodium: 138 mmol/L (ref 135–145)

## 2016-03-05 LAB — GLUCOSE, CAPILLARY
Glucose-Capillary: 87 mg/dL (ref 65–99)
Glucose-Capillary: 92 mg/dL (ref 65–99)
Glucose-Capillary: 61 mg/dL — ABNORMAL LOW (ref 65–99)
Glucose-Capillary: 159 mg/dL — ABNORMAL HIGH (ref 65–99)

## 2016-03-05 LAB — VITAMIN B12: VITAMIN B 12: 762 pg/mL (ref 180–914)

## 2016-03-05 SURGERY — ESOPHAGOGASTRODUODENOSCOPY (EGD) WITH PROPOFOL
Anesthesia: Monitor Anesthesia Care

## 2016-03-05 MED ORDER — PROPOFOL 10 MG/ML IV BOLUS
INTRAVENOUS | Status: AC
  Filled 2016-03-05: qty 20

## 2016-03-05 MED ORDER — MIDAZOLAM HCL 2 MG/2ML IJ SOLN
INTRAMUSCULAR | Status: AC
  Filled 2016-03-05: qty 2

## 2016-03-05 MED ORDER — METRONIDAZOLE IN NACL 5-0.79 MG/ML-% IV SOLN
INTRAVENOUS | Status: AC
  Filled 2016-03-05: qty 100

## 2016-03-05 MED ORDER — FENTANYL CITRATE (PF) 100 MCG/2ML IJ SOLN
25.0000 ug | INTRAMUSCULAR | Status: DC | PRN
  Administered 2016-03-05 (×3): 25 ug via INTRAVENOUS

## 2016-03-05 MED ORDER — CIPROFLOXACIN IN D5W 400 MG/200ML IV SOLN: 400.0000 mg | Freq: Once | INTRAVENOUS | Status: DC

## 2016-03-05 MED ORDER — ENSURE ENLIVE PO LIQD
237.0000 mL | Freq: Two times a day (BID) | ORAL | Status: DC
  Administered 2016-03-06: 237 mL via ORAL

## 2016-03-05 MED ORDER — FENTANYL CITRATE (PF) 100 MCG/2ML IJ SOLN
25.0000 ug | Freq: Once | INTRAMUSCULAR | Status: AC
  Administered 2016-03-05: 25 ug via INTRAVENOUS

## 2016-03-05 MED ORDER — POTASSIUM CHLORIDE 2 MEQ/ML IV SOLN
INTRAVENOUS | Status: DC
  Filled 2016-03-05 (×8): qty 1000

## 2016-03-05 MED ORDER — MIDAZOLAM HCL 5 MG/5ML IJ SOLN
INTRAMUSCULAR | Status: DC | PRN
  Administered 2016-03-05 (×2): 1 mg via INTRAVENOUS

## 2016-03-05 MED ORDER — ONDANSETRON HCL 4 MG/2ML IJ SOLN: 4.0000 mg | Freq: Once | INTRAMUSCULAR | Status: DC | PRN

## 2016-03-05 MED ORDER — CIPROFLOXACIN IN D5W 400 MG/200ML IV SOLN
INTRAVENOUS | Status: AC
  Filled 2016-03-05: qty 200

## 2016-03-05 MED ORDER — SODIUM CHLORIDE 0.9 % IV SOLN
INTRAVENOUS | Status: AC
  Filled 2016-03-05: qty 50

## 2016-03-05 MED ORDER — POTASSIUM CHLORIDE 10 MEQ/100ML IV SOLN: 10.0000 meq | INTRAVENOUS | Status: DC

## 2016-03-05 MED ORDER — MIDAZOLAM HCL 2 MG/2ML IJ SOLN
1.0000 mg | INTRAMUSCULAR | Status: DC | PRN
  Administered 2016-03-05: 2 mg via INTRAVENOUS

## 2016-03-05 MED ORDER — FENTANYL CITRATE (PF) 100 MCG/2ML IJ SOLN
INTRAMUSCULAR | Status: AC
  Filled 2016-03-05: qty 2

## 2016-03-05 MED ORDER — CIPROFLOXACIN IN D5W 400 MG/200ML IV SOLN
INTRAVENOUS | Status: DC | PRN
  Administered 2016-03-05: 400 mg via INTRAVENOUS

## 2016-03-05 MED ORDER — METRONIDAZOLE IN NACL 5-0.79 MG/ML-% IV SOLN
500.0000 mg | Freq: Once | INTRAVENOUS | Status: AC
  Administered 2016-03-05: 500 mg via INTRAVENOUS

## 2016-03-05 MED ORDER — BUTAMBEN-TETRACAINE-BENZOCAINE 2-2-14 % EX AERO: 2.0000 | INHALATION_SPRAY | Freq: Once | CUTANEOUS | Status: DC

## 2016-03-05 MED ORDER — METRONIDAZOLE IN NACL 5-0.79 MG/ML-% IV SOLN: INTRAVENOUS | Status: DC | PRN

## 2016-03-05 MED ORDER — POTASSIUM CHLORIDE 10 MEQ/100ML IV SOLN
10.0000 meq | INTRAVENOUS | Status: AC
  Administered 2016-03-05 (×2): 10 meq via INTRAVENOUS
  Filled 2016-03-05 (×2): qty 100

## 2016-03-05 MED ORDER — PROPOFOL 500 MG/50ML IV EMUL
INTRAVENOUS | Status: DC | PRN
  Administered 2016-03-05: 50 ug/kg/min via INTRAVENOUS

## 2016-03-05 MED ORDER — LACTATED RINGERS IV SOLN
INTRAVENOUS | Status: DC
  Administered 2016-03-05: 10:00:00 via INTRAVENOUS

## 2016-03-05 MED ORDER — KCL IN DEXTROSE-NACL 20-5-0.9 MEQ/L-%-% IV SOLN
INTRAVENOUS | Status: DC
  Administered 2016-03-05 – 2016-03-06 (×2): via INTRAVENOUS

## 2016-03-05 MED ORDER — DEXTROSE 50 % IV SOLN
INTRAVENOUS | Status: AC
  Administered 2016-03-05: 50 mL
  Filled 2016-03-05: qty 50

## 2016-03-05 MED ORDER — DEXTROSE 5 % IV SOLN
INTRAVENOUS | Status: DC | PRN
  Administered 2016-03-05: 12:00:00 via INTRAVENOUS

## 2016-03-05 NOTE — Op Note (Signed)
[facility] Patient Name: Kristine Roberts Procedure Date: 03/05/2016 10:03 AM MRN: [id number] Date of Birth: [birth year] Attending MD: Lionel December , MD CSN: [id number] Age: 60 Admit Type: Inpatient Procedure:                Upper GI endoscopy Indications:              Post-surgical anastomotic stenosis Providers:                Lionel December, MD, Brain Hilts, RN, Calton Dach,                            Technician Referring MD:              Medicines:                Monitored Anesthesia Care Complications:            No immediate complications. Estimated Blood Loss:     Estimated blood loss was minimal. Procedure:                Pre-Anesthesia Assessment:                           - Prior to the procedure, a History and Physical                            was performed, and patient medications and                            allergies were reviewed. The patient's tolerance of                            previous anesthesia was also reviewed. The risks                            and benefits of the procedure and the sedation                            options and risks were discussed with the patient.                            All questions were answered, and informed consent                            was obtained. Prior Anticoagulants: The patient                            last took previous NSAID medication 3 days prior to                            the procedure. ASA Grade Assessment: III - A                            patient with severe systemic disease. After  reviewing the risks and benefits, the patient was                            deemed in satisfactory condition to undergo the                            procedure.                           After obtaining informed consent, the endoscope was                            passed under direct vision. Throughout the                            procedure, the patient's blood pressure, pulse, and                            oxygen saturations were monitored continuously. The                            Endoscope was introduced through the mouth, and                            advanced to the antrum of the stomach. The [id number]                            ([id number]) scope was introduced through the and                            advanced to the. The upper GI endoscopy was                            technically difficult and complex due to abnormal                            anatomy. The patient tolerated the procedure well. Scope In: 10:59:45 AM Scope Out: 12:01:10 PM Total Procedure Duration: 1 hour 1 minute 25 seconds  Findings:      The upper third of the esophagus was normal.      LA Grade D (one or more mucosal breaks involving at least 75% of       esophageal circumference) esophagitis with no bleeding was found.      A 3 cm hiatal hernia was present.      A small amount of food (residue) was found in the gastric fundus.      A benign-appearing, intrinsic severe stenosis with circumferental ulcer       was found at the anastomosis. This was non-traversed. A TTS dilator was       passed through the scope. Dilation with a 09-16-11 mm pyloric balloon       dilator was performed under fluoroscopic guidance. The dilation site was       examined and showed no change. Estimated blood loss was minimal.      An examination of the duodenum was not performed. Impression:               -  Normal upper third of esophagus.                           - LA Grade D reflux esophagitis.                           - 3 cm hiatal hernia.                           - A small amount of food (residue) in the stomach.                           - Gastric stenosis with cirumferential ulcer was                            found at the anastomosis. Dilated.                           - No specimens collected. Moderate Sedation:      Moderate (conscious) sedation was personally administered by an       anesthesia  professional. The following parameters were monitored: oxygen       saturation, heart rate, blood pressure, CO2 capnography and response to       care. Recommendation:           - Return patient to hospital ward for ongoing care.                           - NPO                           - Make the patient NPO starting today.                           - Continue present medications. Procedure Code(s):        --- Professional ---                           [id number], Esophagogastroduodenoscopy, flexible,                            transoral; with dilation of gastric/duodenal                            stricture(s) (eg, balloon, bougie) Diagnosis Code(s):        --- Professional ---                           K21.0, Gastro-esophageal reflux disease with                            esophagitis                           K44.9, Diaphragmatic hernia without obstruction or                            gangrene  K31.89, Other diseases of stomach and duodenum                           K91.89, Other postprocedural complications and                            disorders of digestive system CPT copyright 2016 [employer]. All rights reserved. The codes documented in this report are preliminary and upon coder review may  be revised to meet current compliance requirements. Lionel December, MD Lionel December, MD 03/05/2016 12:15:48 PM This report has been signed electronically. Number of Addenda: 0

## 2016-03-05 NOTE — Progress Notes (Signed)
Lab studies noted. Serum potassium is 3.0. Give KCl IV 10 mEq per hour 4 runs. DC NG tube.

## 2016-03-05 NOTE — Anesthesia Preprocedure Evaluation (Signed)
Anesthesia Evaluation  Patient identified by MRN, date of birth, ID band Patient awake    Reviewed: Allergy & Precautions, NPO status , Patient's Chart, lab work & pertinent test results  Airway Mallampati: I  TM Distance: >3 FB     Dental  (+) Edentulous Upper, Edentulous Lower   Pulmonary COPD, Current Smoker,    breath sounds clear to auscultation       Cardiovascular negative cardio ROS   Rhythm:Regular Rate:Normal     Neuro/Psych    GI/Hepatic PUD, Gastric outlet obstruction Anastomotic stricture   Endo/Other    Renal/GU      Musculoskeletal   Abdominal (+) + scaphoid   Peds  Hematology   Anesthesia Other Findings Malnutrition  Reproductive/Obstetrics                             Anesthesia Physical Anesthesia Plan  ASA: III  Anesthesia Plan: MAC   Post-op Pain Management:    Induction: Intravenous  Airway Management Planned: Simple Face Mask  Additional Equipment:   Intra-op Plan:   Post-operative Plan:   Informed Consent: I have reviewed the patients History and Physical, chart, labs and discussed the procedure including the risks, benefits and alternatives for the proposed anesthesia with the patient or authorized representative who has indicated his/her understanding and acceptance.     Plan Discussed with:   Anesthesia Plan Comments:         Anesthesia Quick Evaluation

## 2016-03-05 NOTE — Care Management Important Message (Signed)
Important Message  Patient Details  Name: Kristine Roberts MRN: [id number] Date of Birth: 07/12/1956   Medicare Important Message Given:  Yes    Adonis Huguenin, RN 03/05/2016, 12:15 PM

## 2016-03-05 NOTE — Progress Notes (Signed)
[facility] PROGRESS NOTE  Kristine Roberts VHQ:[id number] DOB: 05/06/1956 DOA: 03/03/2016 PCP: Jerrell Belfast, FNP  Assessment/Plan: 60 yr old female who  has a past medical history of PUD (peptic ulcer disease) and COPD (chronic obstructive pulmonary disease) (HCC). today was sent to the hospital from endoscopy suite, where patient underwent EGD for chronic dysphagia and weight loss. EGD revealed extensive changes of erosive esophagitis involving distal two thirds of esophagus, small sliding hiatal hernia, gastrojejunostomy with high-grade anastomotic stricture with circumferential ulcer and stomach full of food debris.  Dysphagia; gastric outlet obstruction, S/P endoscopy 3-29: - Normal upper third of esophagus.  LA Grade D reflux esophagitis. - 3 cm hiatal hernia. - A medium amount of food (residue) in the stomach. - Stenosed Billroth I gastroduodenostomy was found, characterized by friable mucosa, ulceration and severe stenosis.  -S/P endoscopy dilation of post surgical anastomotic stricture 3-31. Diet per GI.  Continue with IV fluids.   Hypoglycemia;  Received Amp D 50.  IV fluids D 5.   Hypokalemia; Treated with IV kcl.  Add IV kcl to IV fluids.   COPD  Stable, no exacerbation at this time. Continue albuterol PRN>   Malnutrition in content of dysphagia; Weight loss.  Nutritionist consulted.  Check B 12 level.   DVT prophylaxis Lovenox   Code Status: Full Code.  Family Communication: care discussed with patient.  Disposition Plan; remain inpatient.    Consultants:  GI.   Procedures:  Endoscopy.   Antibiotics: None  HPI/Subjective: She is feeling well, denies worsening abdominal pain.    Objective: Filed Vitals:   03/05/16 1315 03/05/16 1339  BP: 113/71 109/65  Pulse: 45 44  Temp:  97.6 F (36.4 C)  Resp: 10 12    Intake/Output Summary (Last 24 hours) at 03/05/16 1414 Last data filed at 03/05/16 1212  Gross per 24 hour  Intake 1661.67 ml   Output    100 ml  Net 1561.67 ml   Filed Weights   03/03/16 1326 03/03/16 1741  Weight: 30.391 kg (67 lb) 28.577 kg (63 lb)    Exam:   General:  Cachetic, NAD, NG tube in place.   Cardiovascular: S 1, S 2 RRR  Respiratory: CTA  Abdomen: bs present, soft,   Musculoskeletal: no edema  Data Reviewed: Basic Metabolic Panel:  Recent Labs Lab 03/03/16 2121 03/04/16 0619 03/05/16 0630  NA 138 139 138  K 2.4* 4.0 3.0*  CL 100* 102 103  CO2 29 26 27   GLUCOSE 72 58* 107*  BUN 15 12 7   CREATININE 0.42* 0.38* 0.35*  CALCIUM 7.9* 8.2* 8.0*  MG 1.9  --   --    Liver Function Tests:  Recent Labs Lab 03/03/16 2121 03/04/16 0619  AST 18 19  ALT 13* 12*  ALKPHOS 42 43  BILITOT 0.7 0.6  PROT 5.5* 5.6*  ALBUMIN 2.7* 2.8*   No results for input(s): LIPASE, AMYLASE in the last 168 hours. No results for input(s): AMMONIA in the last 168 hours. CBC:  Recent Labs Lab 03/04/16 0619 03/05/16 0630  WBC 6.6 4.8  HGB 12.4 12.0  HCT 38.2 37.4  MCV 95.3 95.4  PLT 232 222   Cardiac Enzymes: No results for input(s): CKTOTAL, CKMB, CKMBINDEX, TROPONINI in the last 168 hours. BNP (last 3 results) No results for input(s): BNP in the last 8760 hours.  ProBNP (last 3 results) No results for input(s): PROBNP in the last 8760 hours.  CBG:  Recent Labs Lab 03/04/16 0825 03/04/16 1742 03/04/16  2006 03/05/16 0424 03/05/16 1002  GLUCAP 175* 115* 81 87 92    No results found for this or any previous visit (from the past 240 hour(s)).   Studies: Dg Ugi W/water Sol Cm  03/04/2016  CLINICAL DATA:  History of ulcers with prior gastrointestinal surgery. EXAM: WATER SOLUBLE UPPER GI SERIES TECHNIQUE: Single-column upper GI series was performed using water soluble contrast. CONTRAST:  ISOVUE-300 IOPAMIDOL (ISOVUE-300) INJECTION 61% COMPARISON:  CT 10/25/2014. FLUOROSCOPY TIME:  Fluoroscopy Time (in minutes and seconds): 4 minutes 6 seconds Number of Acquired Images:  16  FINDINGS: NG tube noted with tip in the stomach. Prior partial gastrectomy. Narrowing of the gastro jejunal anastomosis is noted. Mucosal thickening noted in this region. Adjacent ulceration cannot be excluded. Narrowing is noted of the gastrojejunal anastomosis. This could be from adjacent inflammation and or stricture. Gastroesophageal reflux noted. IMPRESSION: 1. NG tube noted with tip in stomach. 2. Partial gastrectomy. Narrowing of the gastro jejunal anastomosis is noted. Mucosal thickening noted at the anastomosis is noted. Adjacent ulceration cannot be excluded. Narrowing is also noted at the gastrojejunal anastomosis. This could be from adjacent inflammation and or stricture. Electronically Signed   By: Maisie Fus  Register   On: 03/04/2016 12:41   Dg C-arm 1-60 Min-no Report  03/05/2016  CLINICAL DATA: EGD C-ARM 1-60 MINUTES Fluoroscopy was utilized by the requesting physician.  No radiographic interpretation.    Scheduled Meds: . butamben-tetracaine-benzocaine  2 spray Topical Once  . ciprofloxacin  400 mg Intravenous Once  . pantoprazole (PROTONIX) IV  40 mg Intravenous Q12H  . sodium chloride       Continuous Infusions: . sodium chloride 20 mL/hr at 03/03/16 1337  . dextrose 5 %-0.9% NaCl with KCl Pediatric custom IV fluid    . lactated ringers 75 mL/hr at 03/05/16 1025    Active Problems:   Dysphagia   Gastric outlet obstruction   COPD (chronic obstructive pulmonary disease) (HCC)   Protein-calorie malnutrition, severe    Time spent: 35 minutes.     Hartley Barefoot A  [facility] Pager [phone]. If 7PM-7AM, please contact night-coverage at [email or web address], password [facility] 03/05/2016, 2:14 PM  LOS: 1 day

## 2016-03-05 NOTE — Progress Notes (Signed)
Patient had alone dilation of high-grade anastomotic stricture earlier today. She has no complaints. Abdomen is soft and nontender. Will start patient on clear liquids and if she does well will advance diet to full liquids tomorrow.

## 2016-03-05 NOTE — Anesthesia Postprocedure Evaluation (Signed)
Anesthesia Post Note  Patient: Kristine Roberts  Procedure(s) Performed: Procedure(s) (LRB): ESOPHAGOGASTRODUODENOSCOPY (EGD) WITH PROPOFOL Anastomotic stricture dilation  (N/A)  Patient location during evaluation: PACU Anesthesia Type: MAC Level of consciousness: awake and alert and oriented Pain management: pain level controlled Vital Signs Assessment: post-procedure vital signs reviewed and stable Respiratory status: spontaneous breathing and patient connected to face mask oxygen Cardiovascular status: blood pressure returned to baseline and stable Postop Assessment: no signs of nausea or vomiting Anesthetic complications: no    Last Vitals:  Filed Vitals:   03/05/16 1015 03/05/16 1020  BP: 133/78 132/75  Pulse:    Temp:    Resp: 0 0    Last Pain:  Filed Vitals:   03/05/16 1025  PainSc: 6                  ADAMS, AMY A

## 2016-03-05 NOTE — Transfer of Care (Signed)
Immediate Anesthesia Transfer of Care Note  Patient: Kristine Roberts  Procedure(s) Performed: Procedure(s) with comments: ESOPHAGOGASTRODUODENOSCOPY (EGD) WITH PROPOFOL Anastomotic stricture dilation  (N/A) - to be done in OR under fluoro  Patient Location: PACU  Anesthesia Type:MAC  Level of Consciousness: awake, alert , oriented and patient cooperative  Airway & Oxygen Therapy: Patient Spontanous Breathing and Patient connected to face mask oxygen  Post-op Assessment: Report given to RN and Post -op Vital signs reviewed and stable  Post vital signs: Reviewed and stable  Last Vitals:  Filed Vitals:   03/05/16 1015 03/05/16 1020  BP: 133/78 132/75  Pulse:    Temp:    Resp: 0 0    Complications: No apparent anesthesia complications

## 2016-03-06 LAB — BASIC METABOLIC PANEL
Anion gap: 5 (ref 5–15)
BUN: <5 mg/dL — ABNORMAL LOW (ref 6–20)
CHLORIDE: 105 mmol/L (ref 101–111)
CO2: 29 mmol/L (ref 22–32)
Calcium: 8 mg/dL — ABNORMAL LOW (ref 8.9–10.3)
Creatinine, Ser: 0.37 mg/dL — ABNORMAL LOW (ref 0.44–1.00)
GFR calc Af Amer: >60 mL/min (ref >60)
GFR calc non Af Amer: >60 mL/min (ref >60)
GLUCOSE: 117 mg/dL — AB (ref 65–99)
POTASSIUM: 4 mmol/L (ref 3.5–5.1)
Sodium: 139 mmol/L (ref 135–145)

## 2016-03-06 LAB — CBC
HEMATOCRIT: 39.1 % (ref 36.0–46.0)
Hemoglobin: 12.5 g/dL (ref 12.0–15.0)
MCH: 30.6 pg (ref 26.0–34.0)
MCHC: 32 g/dL (ref 30.0–36.0)
MCV: 95.6 fL (ref 78.0–100.0)
Platelets: 230 10*3/uL (ref 150–400)
RBC: 4.09 MIL/uL (ref 3.87–5.11)
RDW: 15.3 % (ref 11.5–15.5)
WBC: 4.7 10*3/uL (ref 4.0–10.5)

## 2016-03-06 LAB — GLUCOSE, CAPILLARY
GLUCOSE-CAPILLARY: 91 mg/dL (ref 65–99)
GLUCOSE-CAPILLARY: 102 mg/dL — AB (ref 65–99)
Glucose-Capillary: 143 mg/dL — ABNORMAL HIGH (ref 65–99)

## 2016-03-06 MED ORDER — ENSURE ENLIVE PO LIQD: 237.0000 mL | Freq: Two times a day (BID) | ORAL | Status: DC

## 2016-03-06 MED ORDER — HYDROCODONE-ACETAMINOPHEN 5-300 MG PO TABS: 1.0000 | ORAL_TABLET | Freq: Four times a day (QID) | ORAL | Status: DC | PRN

## 2016-03-06 MED ORDER — PANTOPRAZOLE SODIUM 40 MG PO TBEC
40.0000 mg | DELAYED_RELEASE_TABLET | Freq: Two times a day (BID) | ORAL | Status: DC
Start: 2016-03-06 — End: 2017-12-30

## 2016-03-06 MED ORDER — PANTOPRAZOLE SODIUM 40 MG PO TBEC: 40.0000 mg | DELAYED_RELEASE_TABLET | Freq: Two times a day (BID) | ORAL | Status: DC

## 2016-03-06 NOTE — Discharge Summary (Signed)
Physician Discharge Summary  Kristine Roberts DJM:[id number] DOB: 04-Mar-1956 DOA: 03/03/2016  PCP: Jerrell Belfast, FNP  Admit date: 03/03/2016 Discharge date: 03/06/2016  Time spent: 35 minutes  Recommendations for Outpatient Follow-up:  Follow up with Dr. Karilyn Cota for further endoscopy, dilation.   Discharge Diagnoses:    Dysphagia   Gastric outlet obstruction   COPD (chronic obstructive pulmonary disease) (HCC)   Protein-calorie malnutrition, severe   Discharge Condition: stable.   Diet recommendation: Full liquid diet.   Filed Weights   03/03/16 1326 03/03/16 1741  Weight: 30.391 kg (67 lb) 28.577 kg (63 lb)    History of present illness:  60 yr old female who  has a past medical history of PUD (peptic ulcer disease) and COPD (chronic obstructive pulmonary disease) (HCC). today was sent to the hospital from endoscopy suite, where patient underwent EGD for chronic dysphagia and weight loss. EGD revealed extensive changes of erosive esophagitis involving distal two thirds of esophagus, small sliding hiatal hernia, gastrojejunostomy with high-grade anastomotic stricture with circumferential ulcer and stomach full of food debris. As per patient she has been having symptoms of dysphagia for past 2 years, she was able to swallow liquid diet most of the times but regurgitated solid food most of the time. She has lost considerable weight and only weighs 63 pounds. She denies chest pain or shortness of breath. She does smoke one pill of cigarettes a day. Denies any abdominal pain. No diarrhea. No fever or chills. No dysuria urgency frequency of urination.  Hospital Course:  60 yr old female who has a past medical history of PUD (peptic ulcer disease) and COPD (chronic obstructive pulmonary disease) (HCC). today was sent to the hospital from endoscopy suite, where patient underwent EGD for chronic dysphagia and weight loss. EGD revealed extensive changes of erosive esophagitis involving distal  two thirds of esophagus, small sliding hiatal hernia, gastrojejunostomy with high-grade anastomotic stricture with circumferential ulcer and stomach full of food debris.  Dysphagia; gastric outlet obstruction, S/P endoscopy 3-29: - Normal upper third of esophagus. LA Grade D reflux esophagitis. - 3 cm hiatal hernia. - A medium amount of food (residue) in the stomach. - Stenosed Billroth I gastroduodenostomy was found, characterized by friable mucosa, ulceration and severe stenosis.  -S/P endoscopy dilation of post surgical anastomotic stricture 3-31. Started on liquid diet. Patient is feeling better, tolerating current diet.  Received IV fluids. prescriptions for ensure ordered.   Hypoglycemia;  Received Amp D 50.  IV fluids D 5.  Resolved, likely in setting of fasting.  Advised patient to eat, drinks frequent meals.   Hypokalemia; Treated with IV kcl.  Add IV kcl to IV fluids.   COPD  Stable, no exacerbation at this time. Continue albuterol PRN>   Severe Malnutrition in content of dysphagia; Weight loss.  Nutritionist consulted.   B 12 level nl Ensure.   DVT prophylaxis Lovenox  Procedures:  Endoscopy, dilation.   Consultations:    Discharge Exam: Filed Vitals:   03/05/16 2206 03/06/16 0506  BP: 103/64 109/71  Pulse: 46 47  Temp: 97.5 F (36.4 C) 97.6 F (36.4 C)  Resp: 16 20    General: NAD Cardiovascular: S 1, S 2 RRR Respiratory: CTA  Discharge Instructions   Discharge Instructions    Increase activity slowly    Complete by:  As directed           Current Discharge Medication List    START taking these medications   Details  feeding supplement, ENSURE ENLIVE, (  ENSURE ENLIVE) LIQD Take 237 mLs by mouth 2 (two) times daily between meals. Qty: 237 mL, Refills: 12    Hydrocodone-Acetaminophen 5-300 MG TABS Take 1 tablet by mouth every 6 (six) hours as needed. Qty: 40 each, Refills: 0    pantoprazole (PROTONIX) 40 MG tablet Take 1 tablet  (40 mg total) by mouth 2 (two) times daily before a meal. Qty: 60 tablet, Refills: 0      CONTINUE these medications which have NOT CHANGED   Details  albuterol (PROVENTIL HFA;VENTOLIN HFA) 108 (90 Base) MCG/ACT inhaler Inhale into the lungs every 6 (six) hours as needed for wheezing or shortness of breath.    FLUoxetine (PROZAC) 40 MG capsule Take 40 mg by mouth daily.    Potassium 99 MG TABS Take 99 mg by mouth daily.     tetrahydrozoline 0.05 % ophthalmic solution Place 1 drop into both eyes daily as needed (dry eyes).    traZODone (DESYREL) 100 MG tablet Take 100 mg by mouth at bedtime.    Iron Combinations (CHROMAGEN) capsule Take 1 capsule by mouth daily. 45    Multiple Vitamin (MULTIVITAMIN) tablet Take 1 tablet by mouth daily.      STOP taking these medications     Aspirin-Acetaminophen-Caffeine (GOODY HEADACHE PO)      omeprazole (PRILOSEC) 20 MG capsule        Allergies  Allergen Reactions  . Penicillins     Tongue swells, itching Has patient had a PCN reaction causing immediate rash, facial/tongue/throat swelling, SOB or lightheadedness with hypotension: Yes, had a rash and tongue swelled up. No SOB and lightheadedness. Has patient had a PCN reaction causing severe rash involving mucus membranes or skin necrosis: No Has patient had a PCN reaction that required hospitalization: No Has patient had a PCN reaction occurring within the last 10 years: No If all of the above answers are "NO", then may procee   Follow-up Information    Follow up with REHMAN,NAJEEB U, MD In 2 weeks.   Specialty:  Gastroenterology   Contact information:   [address], [address] [place] [address] [phone]        The results of significant diagnostics from this hospitalization (including imaging, microbiology, ancillary and laboratory) are listed below for reference.    Significant Diagnostic Studies: Dg Ugi W/water Sol Cm  03/04/2016  CLINICAL DATA:  History of ulcers  with prior gastrointestinal surgery. EXAM: WATER SOLUBLE UPPER GI SERIES TECHNIQUE: Single-column upper GI series was performed using water soluble contrast. CONTRAST:  ISOVUE-300 IOPAMIDOL (ISOVUE-300) INJECTION 61% COMPARISON:  CT 10/25/2014. FLUOROSCOPY TIME:  Fluoroscopy Time (in minutes and seconds): 4 minutes 6 seconds Number of Acquired Images:  16 FINDINGS: NG tube noted with tip in the stomach. Prior partial gastrectomy. Narrowing of the gastro jejunal anastomosis is noted. Mucosal thickening noted in this region. Adjacent ulceration cannot be excluded. Narrowing is noted of the gastrojejunal anastomosis. This could be from adjacent inflammation and or stricture. Gastroesophageal reflux noted. IMPRESSION: 1. NG tube noted with tip in stomach. 2. Partial gastrectomy. Narrowing of the gastro jejunal anastomosis is noted. Mucosal thickening noted at the anastomosis is noted. Adjacent ulceration cannot be excluded. Narrowing is also noted at the gastrojejunal anastomosis. This could be from adjacent inflammation and or stricture. Electronically Signed   By: Maisie Fus  Register   On: 03/04/2016 12:41   Dg C-arm 1-60 Min  03/05/2016  CLINICAL DATA:  Prior partial gastrectomy with gastrojejunostomy for ulcer disease. Gastric outlet stricture status  post 12 mm balloon dilation. EXAM: DG C-ARM 1-60 MIN-NO REPORT; DG C-ARM 61-120 MIN COMPARISON:  10/25/2014 CT abdomen/ pelvis. FINDINGS: Fluoroscopy time 3 minutes 55 seconds. Surgical sutures are noted in the medial left upper abdomen. Re- demonstrated is a stricture of the proximal jejunal limb of the gastrojejunostomy. Final submitted images demonstrate injected contrast traversing into the jejunum beyond on the stricture site. IMPRESSION: Intraoperative fluoroscopic guidance for balloon dilation of stricture near the gastrojejunostomy, with evidence of injected contrast traversing the stricture site on the final submitted images. Electronically Signed   By:  Delbert Phenix M.D.   On: 03/05/2016 15:45    Microbiology: No results found for this or any previous visit (from the past 240 hour(s)).   Labs: Basic Metabolic Panel:  Recent Labs Lab 03/03/16 2121 03/04/16 0619 03/05/16 0630 03/06/16 0714  NA 138 139 138 139  K 2.4* 4.0 3.0* 4.0  CL 100* 102 103 105  CO2 29 26 27 29   GLUCOSE 72 58* 107* 117*  BUN 15 12 7  <5*  CREATININE 0.42* 0.38* 0.35* 0.37*  CALCIUM 7.9* 8.2* 8.0* 8.0*  MG 1.9  --   --   --    Liver Function Tests:  Recent Labs Lab 03/03/16 2121 03/04/16 0619  AST 18 19  ALT 13* 12*  ALKPHOS 42 43  BILITOT 0.7 0.6  PROT 5.5* 5.6*  ALBUMIN 2.7* 2.8*   No results for input(s): LIPASE, AMYLASE in the last 168 hours. No results for input(s): AMMONIA in the last 168 hours. CBC:  Recent Labs Lab 03/04/16 0619 03/05/16 0630 03/06/16 0714  WBC 6.6 4.8 4.7  HGB 12.4 12.0 12.5  HCT 38.2 37.4 39.1  MCV 95.3 95.4 95.6  PLT 232 222 230   Cardiac Enzymes: No results for input(s): CKTOTAL, CKMB, CKMBINDEX, TROPONINI in the last 168 hours. BNP: BNP (last 3 results) No results for input(s): BNP in the last 8760 hours.  ProBNP (last 3 results) No results for input(s): PROBNP in the last 8760 hours.  CBG:  Recent Labs Lab 03/05/16 1002 03/05/16 1625 03/05/16 1705 03/05/16 2042 03/06/16 0739  GLUCAP 92 61* 159* 143* 91       Signed:  2043 A MD.  [facility] 03/06/2016, 11:42 AM

## 2016-03-06 NOTE — Progress Notes (Signed)
Patient received discharge instructions along with follow up appointments. Patient verbalized understanding of all instructions. Patient was escorted by staff via wheelchair to vehicle. Patient discharged to home in stable condition. 

## 2016-03-06 NOTE — Progress Notes (Signed)
  Subjective:  Patient has no complaints. She denies heartburn nausea vomiting or epigastric pain. She states she will need some pain medication so that she would not have to take OTC NSAIDs.  Objective: Blood pressure 109/71, pulse 47, temperature 97.6 F (36.4 C), temperature source Oral, resp. rate 20, height 5\' 2"  (1.575 m), weight 63 lb (28.577 kg), SpO2 99 %. Patient is alert and in no acute distress. Abdomen is flat with normal bowel sounds. On palpation is soft and nontender without organomegaly or masses. Extremities thin and wasted.  Labs/studies Results:   Recent Labs  03/04/16 0619 03/05/16 0630 03/06/16 0714  WBC 6.6 4.8 4.7  HGB 12.4 12.0 12.5  HCT 38.2 37.4 39.1  PLT 232 222 230    BMET   Recent Labs  03/04/16 0619 03/05/16 0630 03/06/16 0714  NA 139 138 139  K 4.0 3.0* 4.0  CL 102 103 105  CO2 26 27 29   GLUCOSE 58* 107* 117*  BUN 12 7 <5*  CREATININE 0.38* 0.35* 0.37*  CALCIUM 8.2* 8.0* 8.0*    LFT   Recent Labs  03/03/16 2121 03/04/16 0619  PROT 5.5* 5.6*  ALBUMIN 2.7* 2.8*  AST 18 19  ALT 13* 12*  ALKPHOS 42 43  BILITOT 0.7 0.6     Assessment:  #1.Gastric outlet obstruction secondary to gastroduodenal anastomotic ulcer and stricture. Status post balloon dilation yesterday to 12 mm under fluoroscopic control. Patient is doing very well. Diet advanced to full liquids. #2. Severe malnutrition secondary to above..  Recommendations:  Change pantoprazole to oral route. If patient tolerates full liquid she can be discharged after lunch. Patient advised to eat blended foods. Once again patient advised to stay of or NSAIDs. She will benefit from a short supply of hydrocodone for her back pain. Will bring patient back for outpatient repeat dilation in 2-3 weeks.

## 2016-03-08 ENCOUNTER — Encounter (HOSPITAL_COMMUNITY): Payer: Self-pay | Admitting: Internal Medicine

## 2016-03-09 ENCOUNTER — Other Ambulatory Visit (INDEPENDENT_AMBULATORY_CARE_PROVIDER_SITE_OTHER): Payer: Self-pay | Admitting: Internal Medicine

## 2016-03-09 ENCOUNTER — Encounter (INDEPENDENT_AMBULATORY_CARE_PROVIDER_SITE_OTHER): Payer: Self-pay | Admitting: *Deleted

## 2016-03-09 DIAGNOSIS — R1310 Dysphagia, unspecified: Secondary | ICD-10-CM

## 2016-05-07 ENCOUNTER — Encounter (HOSPITAL_COMMUNITY): Payer: Self-pay | Admitting: *Deleted

## 2016-05-07 ENCOUNTER — Ambulatory Visit (HOSPITAL_COMMUNITY)
Admission: RE | Admit: 2016-05-07 | Discharge: 2016-05-07 | Disposition: A | Discharge status: Home / Self Care | Payer: MEDICAID | Source: Ambulatory Visit | Attending: Internal Medicine | Admitting: Internal Medicine

## 2016-05-07 ENCOUNTER — Encounter (HOSPITAL_COMMUNITY)
Admission: RE | Disposition: A | Discharge status: Home / Self Care | Payer: Self-pay | Source: Ambulatory Visit | Attending: Internal Medicine

## 2016-05-07 DIAGNOSIS — K6389 Other specified diseases of intestine: Secondary | ICD-10-CM | POA: Insufficient documentation

## 2016-05-07 DIAGNOSIS — Z8711 Personal history of peptic ulcer disease: Secondary | ICD-10-CM | POA: Insufficient documentation

## 2016-05-07 DIAGNOSIS — M6250 Muscle wasting and atrophy, not elsewhere classified, unspecified site: Secondary | ICD-10-CM | POA: Insufficient documentation

## 2016-05-07 DIAGNOSIS — K311 Adult hypertrophic pyloric stenosis: Secondary | ICD-10-CM | POA: Insufficient documentation

## 2016-05-07 DIAGNOSIS — R1310 Dysphagia, unspecified: Secondary | ICD-10-CM

## 2016-05-07 DIAGNOSIS — Z681 Body mass index (BMI) 19 or less, adult: Secondary | ICD-10-CM | POA: Insufficient documentation

## 2016-05-07 DIAGNOSIS — F17210 Nicotine dependence, cigarettes, uncomplicated: Secondary | ICD-10-CM | POA: Insufficient documentation

## 2016-05-07 DIAGNOSIS — K210 Gastro-esophageal reflux disease with esophagitis: Secondary | ICD-10-CM

## 2016-05-07 DIAGNOSIS — Z803 Family history of malignant neoplasm of breast: Secondary | ICD-10-CM | POA: Insufficient documentation

## 2016-05-07 DIAGNOSIS — K228 Other specified diseases of esophagus: Secondary | ICD-10-CM

## 2016-05-07 DIAGNOSIS — Z79899 Other long term (current) drug therapy: Secondary | ICD-10-CM | POA: Insufficient documentation

## 2016-05-07 DIAGNOSIS — Z903 Acquired absence of stomach [part of]: Secondary | ICD-10-CM | POA: Insufficient documentation

## 2016-05-07 DIAGNOSIS — J449 Chronic obstructive pulmonary disease, unspecified: Secondary | ICD-10-CM | POA: Insufficient documentation

## 2016-05-07 DIAGNOSIS — K9189 Other postprocedural complications and disorders of digestive system: Secondary | ICD-10-CM

## 2016-05-07 DIAGNOSIS — R634 Abnormal weight loss: Secondary | ICD-10-CM | POA: Insufficient documentation

## 2016-05-07 DIAGNOSIS — Z980 Intestinal bypass and anastomosis status: Secondary | ICD-10-CM

## 2016-05-07 HISTORY — PX: ESOPHAGOGASTRODUODENOSCOPY: SHX5428

## 2016-05-07 HISTORY — PX: ESOPHAGEAL DILATION: SHX303

## 2016-05-07 SURGERY — EGD (ESOPHAGOGASTRODUODENOSCOPY)
Anesthesia: Moderate Sedation

## 2016-05-07 MED ORDER — BUTAMBEN-TETRACAINE-BENZOCAINE 2-2-14 % EX AERO
INHALATION_SPRAY | CUTANEOUS | Status: DC | PRN
  Administered 2016-05-07: 2 via TOPICAL

## 2016-05-07 MED ORDER — STERILE WATER FOR IRRIGATION IR SOLN
Status: DC | PRN
  Administered 2016-05-07: 12:00:00

## 2016-05-07 MED ORDER — MIDAZOLAM HCL 5 MG/5ML IJ SOLN
INTRAMUSCULAR | Status: AC
  Filled 2016-05-07: qty 10

## 2016-05-07 MED ORDER — MEPERIDINE HCL 50 MG/ML IJ SOLN
INTRAMUSCULAR | Status: DC | PRN
  Administered 2016-05-07 (×2): 25 mg via INTRAVENOUS

## 2016-05-07 MED ORDER — SODIUM CHLORIDE 0.9 % IV SOLN
INTRAVENOUS | Status: DC
  Administered 2016-05-07: 11:00:00 via INTRAVENOUS

## 2016-05-07 MED ORDER — MIDAZOLAM HCL 5 MG/5ML IJ SOLN
INTRAMUSCULAR | Status: DC | PRN
  Administered 2016-05-07: 1 mg via INTRAVENOUS
  Administered 2016-05-07 (×2): 2 mg via INTRAVENOUS

## 2016-05-07 MED ORDER — MEPERIDINE HCL 50 MG/ML IJ SOLN
INTRAMUSCULAR | Status: AC
  Filled 2016-05-07: qty 1

## 2016-05-07 NOTE — Discharge Instructions (Signed)
Resume usual medications and diet. These do not take aspirin BC powder or similar medications. No driving for 24 hours. Office visit in 8 weeks.      Esophagogastroduodenoscopy, Care After Refer to this sheet in the next few weeks. These instructions provide you with information about caring for yourself after your procedure. Your health care provider may also give you more specific instructions. Your treatment has been planned according to current medical practices, but problems sometimes occur. Call your health care provider if you have any problems or questions after your procedure. WHAT TO EXPECT AFTER THE PROCEDURE After your procedure, it is typical to feel:  Soreness in your throat.  Pain with swallowing.  Sick to your stomach (nauseous).  Bloated.  Dizzy.  Fatigued. HOME CARE INSTRUCTIONS  Do not eat or drink anything until the numbing medicine (local anesthetic) has worn off and your gag reflex has returned. You will know that the local anesthetic has worn off when you can swallow comfortably.  Do not drive or operate machinery until directed by your health care provider.  Take medicines only as directed by your health care provider. SEEK MEDICAL CARE IF:   You cannot stop coughing.  You are not urinating at all or less than usual. SEEK IMMEDIATE MEDICAL CARE IF:  You have difficulty swallowing.  You cannot eat or drink.  You have worsening throat or chest pain.  You have dizziness or lightheadedness or you faint.  You have nausea or vomiting.  You have chills.  You have a fever.  You have severe abdominal pain.  You have black, tarry, or bloody stools.   This information is not intended to replace advice given to you by your health care provider. Make sure you discuss any questions you have with your health care provider.   Document Released: 11/08/2012 Document Revised: 12/13/2014 Document Reviewed: 11/08/2012 Elsevier Interactive Patient  Education [employer]

## 2016-05-07 NOTE — H&P (Signed)
Kristine Roberts is an 60 y.o. female.   Chief Complaint: Patient is here for EGD and anastomotic stricture dilation HPI: Patient is 60 year old Caucasian female was history of peptic ulcer disease status post partial gastrectomy 7 years ago. She was admitted to this facility to attend of March with gastric outlet obstruction and required NG tube for 2 days. Repeat EGD performed and gastroduodenal anastomotic stricture was dilated from 10-12 mm with a balloon. She states she did not vomit for few days but since then she's been vomiting every day. She vomits fluid and food. She denies hematemesis. She still taking OTC NSAIDs and when necessary basis even though she was told not to do so. She weighs 77 pounds today which means she has gained 10 or 11 pounds since she was last weighed.  Past Medical History  Diagnosis Date  . PUD (peptic ulcer disease)   . COPD (chronic obstructive pulmonary disease) [facility])     Past Surgical History  Procedure Laterality Date  . Appendectomy    . Cholecystectomy    . Stomach surgery    . Esophagogastroduodenoscopy N/A 03/03/2016    Procedure: ESOPHAGOGASTRODUODENOSCOPY (EGD);  Surgeon: Malissa Hippo, MD;  Location: [facility];  Service: Endoscopy;  Laterality: N/A;  2:00  . Esophageal dilation N/A 03/03/2016    Procedure: ESOPHAGEAL DILATION;  Surgeon: Malissa Hippo, MD;  Location: [facility];  Service: Endoscopy;  Laterality: N/A;  . Esophagogastroduodenoscopy (egd) with propofol N/A 03/05/2016    Procedure: ESOPHAGOGASTRODUODENOSCOPY (EGD) WITH PROPOFOL Anastomotic stricture dilation ;  Surgeon: Malissa Hippo, MD;  Location: [facility];  Service: Endoscopy;  Laterality: N/A;  to be done in OR under fluoro  . Balloon dilation N/A 03/05/2016    Procedure: BALLOON DILATION;  Surgeon: Malissa Hippo, MD;  Location: [facility];  Service: Endoscopy;  Laterality: N/A;  pyloric channel dialtaion    History reviewed. No pertinent family history. Social  History:  reports that she has been smoking Cigarettes.  She has smoked for the past 20 years. She does not have any smokeless tobacco history on file. She reports that she drinks about 7.2 oz of alcohol per week. She reports that she does not use illicit drugs.  Allergies:  Allergies  Allergen Reactions  . Penicillins     Tongue swells, itching Has patient had a PCN reaction causing immediate rash, facial/tongue/throat swelling, SOB or lightheadedness with hypotension: Yes, had a rash and tongue swelled up. No SOB and lightheadedness. Has patient had a PCN reaction causing severe rash involving mucus membranes or skin necrosis: No Has patient had a PCN reaction that required hospitalization: No Has patient had a PCN reaction occurring within the last 10 years: No If all of the above answers are "NO", then may procee    Medications Prior to Admission  Medication Sig Dispense Refill  . albuterol (PROVENTIL HFA;VENTOLIN HFA) 108 (90 Base) MCG/ACT inhaler Inhale into the lungs every 6 (six) hours as needed for wheezing or shortness of breath.    . feeding supplement, ENSURE ENLIVE, (ENSURE ENLIVE) LIQD Take 237 mLs by mouth 2 (two) times daily between meals. 237 mL 12  . FLUoxetine (PROZAC) 40 MG capsule Take 40 mg by mouth daily.    . Hydrocodone-Acetaminophen 5-300 MG TABS Take 1 tablet by mouth every 6 (six) hours as needed. 40 each 0  . Multiple Vitamin (MULTIVITAMIN) tablet Take 1 tablet by mouth daily.    . pantoprazole (PROTONIX) 40 MG tablet Take 1  tablet (40 mg total) by mouth 2 (two) times daily before a meal. 60 tablet 0  . Potassium 99 MG TABS Take 99 mg by mouth daily.     Marland Kitchen tetrahydrozoline 0.05 % ophthalmic solution Place 1 drop into both eyes daily as needed (dry eyes).    . traZODone (DESYREL) 100 MG tablet Take 100 mg by mouth at bedtime.    . Iron Combinations (CHROMAGEN) capsule Take 1 capsule by mouth daily. 45      No results found for this or any previous visit (from  the past 48 hour(s)). No results found.  ROS  Blood pressure 109/66, pulse 57, temperature 97.7 F (36.5 C), temperature source Oral, resp. rate 14, height 5\' 2"  (1.575 m), weight 63 lb (28.577 kg), SpO2 100 %. Physical Exam  Constitutional:  Well-developed Caucasian female with generalized muscle wasting. She appears cachectic.  HENT:  Mouth/Throat: Oropharynx is clear and moist.  Eyes: Conjunctivae are normal. No scleral icterus.  Neck: No thyromegaly present.  Cardiovascular: Normal rate, regular rhythm and normal heart sounds.   No murmur heard. Respiratory: Effort normal and breath sounds normal.  GI: Soft. She exhibits no distension and no mass. There is no tenderness.  Musculoskeletal: She exhibits no edema.  Lymphadenopathy:    She has no cervical adenopathy.  Neurological: She is alert.  Skin: Skin is warm and dry.     Assessment/Plan Peptic ulcer disease with gastroduodenal anastomotic stricture. EGD with dilation of anastomotic stricture.  , MD 05/07/2016, 11:58 AM

## 2016-05-07 NOTE — Op Note (Signed)
[facility] Patient Name: Kristine Roberts Procedure Date: 05/07/2016 11:52 AM MRN: [id number] Date of Birth: 1956/01/13 Attending MD: Lionel December , MD CSN: [id number] Age: 60 Admit Type: Outpatient Procedure:                Upper GI endoscopy Indications:              Therapeutic procedure, For therapy of post-surgical                            gastroduodena anastomotic stenosis Providers:                Lionel December, MD, Brain Hilts, RN, Gearldine Shown,                            Technologist Referring MD:             Eugenio Hoes House, FNP Medicines:                Cetacaine spray, Meperidine 50 mg IV, Midazolam 5                            mg IV Complications:            No immediate complications. Estimated Blood Loss:     Estimated blood loss was minimal. Procedure:                Pre-Anesthesia Assessment:                           - Prior to the procedure, a History and Physical                            was performed, and patient medications and                            allergies were reviewed. The patient's tolerance of                            previous anesthesia was also reviewed. The risks                            and benefits of the procedure and the sedation                            options and risks were discussed with the patient.                            All questions were answered, and informed consent                            was obtained. Prior Anticoagulants: The patient                            last took previous NSAID medication 2 days prior to  the procedure. ASA Grade Assessment: III - A                            patient with severe systemic disease. After                            reviewing the risks and benefits, the patient was                            deemed in satisfactory condition to undergo the                            procedure.                           After obtaining informed consent, the endoscope  was                            passed under direct vision. Throughout the                            procedure, the patient's blood pressure, pulse, and                            oxygen saturations were monitored continuously. The                            EG-[id number] ([id number]) scope was introduced through the                            and advanced to the proximal jejunum. The upper GI                            endoscopy was accomplished without difficulty. The                            patient tolerated the procedure well. Scope In: 12:09:01 PM Scope Out: 12:26:21 PM Total Procedure Duration: 0 hours 17 minutes 20 seconds  Findings:      LA Grade D (one or more mucosal breaks involving at least 75% of       esophageal circumference) esophagitis with no bleeding was found 25 to       34 cm from the incisors.      The upper third of the esophagus was normal.      The Z-line was irregular and was found 34 cm from the incisors.      A 2 cm hiatal hernia was present.      Evidence of a stenosed Billroth I gastroduodenostomy was found. A       gastric pouch with a large size was found containing food debris. The       gastroduodenal anastomosis was characterized by ulcerated mucosa. This       was traversed after dilation. A TTS dilator was passed through the       scope. Dilation with a 12 mm and a 13.5 mm pyloric balloon dilator was  performed. The dilation site was examined and showed moderate       improvement in luminal narrowing. Estimated blood loss was minimal.      The examined jejunum was normal. Impression:               - LA Grade D reflux esophagitis.                           - Normal upper third of esophagus.                           - Z-line irregular, 34 cm from the incisors.                           - 2 cm hiatal hernia.                           - Stenosed Billroth I gastroduodenostomy was found,                            characterized by ulcerated mucosa.  Dilated from 12                            mm to 13.5 mm.                           - Normal examined jejunum.                           - No specimens collected. Moderate Sedation:      Moderate (conscious) sedation was administered by the endoscopy nurse       and supervised by the endoscopist. The following parameters were       monitored: oxygen saturation, heart rate, blood pressure, CO2       capnography and response to care. Total physician intraservice time was       22 minutes. Recommendation:           - Patient has a contact number available for                            emergencies. The signs and symptoms of potential                            delayed complications were discussed with the                            patient. Return to normal activities tomorrow.                            Written discharge instructions were provided to the                            patient.                           - Patient has a contact number available for  emergencies. The signs and symptoms of potential                            delayed complications were discussed with the                            patient. Return to normal activities tomorrow.                            Written discharge instructions were provided to the                            patient.                           - Low fiber diet today.                           - Continue present medications.                           - No aspirin, ibuprofen, naproxen, or other                            non-steroidal anti-inflammatory drugs.                           - Return to my office in 2 months.                           - must refrain from using all NSAIDs. Procedure Code(s):        --- Professional ---                           [phone], Esophagogastroduodenoscopy, flexible,                            transoral; with dilation of gastric/duodenal                            stricture(s) (eg,  balloon, bougie)                           99152, Moderate sedation services provided by the                            same physician or other qualified health care                            professional performing the diagnostic or                            therapeutic service that the sedation supports,                            requiring the presence of an independent trained  observer to assist in the monitoring of the                            patient's level of consciousness and physiological                            status; initial 15 minutes of intraservice time,                            patient age 52 years or older Diagnosis Code(s):        --- Professional ---                           K21.0, Gastro-esophageal reflux disease with                            esophagitis                           K22.8, Other specified diseases of esophagus                           K44.9, Diaphragmatic hernia without obstruction or                            gangrene                           Z98.0, Intestinal bypass and anastomosis status                           K91.89, Other postprocedural complications and                            disorders of digestive system CPT copyright 2016 [employer]. All rights reserved. The codes documented in this report are preliminary and upon coder review may  be revised to meet current compliance requirements. Lionel December, MD Lionel December, MD 05/07/2016 12:40:05 PM This report has been signed electronically. Number of Addenda: 0

## 2016-05-07 NOTE — OR Nursing (Signed)
Pre procedure weight 74.8 ordered per Dr. Karilyn Cota

## 2016-05-11 ENCOUNTER — Encounter (HOSPITAL_COMMUNITY): Payer: Self-pay | Admitting: Internal Medicine

## 2016-07-06 ENCOUNTER — Ambulatory Visit (INDEPENDENT_AMBULATORY_CARE_PROVIDER_SITE_OTHER): Payer: Self-pay | Admitting: Internal Medicine

## 2016-07-06 ENCOUNTER — Other Ambulatory Visit (HOSPITAL_COMMUNITY): Payer: Self-pay | Admitting: Family

## 2016-07-06 ENCOUNTER — Encounter (INDEPENDENT_AMBULATORY_CARE_PROVIDER_SITE_OTHER): Payer: Self-pay | Admitting: Internal Medicine

## 2016-07-06 VITALS — BP 100/54 | HR 56 | Temp 98.0°F | Ht 62.0 in | Wt 77.8 lb

## 2016-07-06 DIAGNOSIS — Z780 Asymptomatic menopausal state: Secondary | ICD-10-CM

## 2016-07-06 DIAGNOSIS — K311 Adult hypertrophic pyloric stenosis: Secondary | ICD-10-CM

## 2016-07-06 DIAGNOSIS — R1310 Dysphagia, unspecified: Secondary | ICD-10-CM

## 2016-07-06 DIAGNOSIS — R634 Abnormal weight loss: Secondary | ICD-10-CM

## 2016-07-06 NOTE — Progress Notes (Signed)
Subjective:    Patient ID: Kristine Roberts, female    DOB: 1956/02/02, 60 y.o.   MRN: [id number]  HPI Here today for f/u after undergoing an EGD in June for dysphagia. Seen by me in February with weight loss of about 61 pounds over the past 3 yrs.s  Hx of taking Goody Powders x 2 a day. Last weight in February was 67 lbs. Today her weight is 77.8 pounds. She tells me she is doing good. Eating 3 meals a day. She drinks Ensure or Boost 1-2 a day. She says she is not having any dysphagia.  There is no abdominal pain.  She has a BM x 1 a day. No melena or BRRB. She has a hx of PUD and she says she has stomach surgery 2001 or 2002 because of ulcers by Dr. Cleotis Nipper.  She has taken Marlin Canary Powder's for years (2 a day).  She has not taken any Goody Powders since OV.    Patient has been evaluated by a psychiatrist for possible Bulimia and was referred here to rule out PUD Chrissie Noa at [facility]). 03/05/2016 EGD: Dr. Karilyn Cota: dyshagia: Impression:               - Normal upper third of esophagus.                           - LA Grade D reflux esophagitis.                           - 3 cm hiatal hernia.                           - A small amount of food (residue) in the stomach.                           - Gastric stenosis with cirumferential ulcer was                            found at the anastomosis. Dilated.                           - No specimens collected.    05/07/2016 EGD: Dr. Karilyn Cota: Impression:               - LA Grade D reflux esophagitis.                           - Normal upper third of esophagus.                           - Z-line irregular, 34 cm from the incisors.                           - 2 cm hiatal hernia.                           - Stenosed Billroth I gastroduodenostomy was found,                            characterized by ulcerated mucosa. Dilated from  12                            mm to 13.5 mm.                           - Normal examined jejunum.                           -  No specimens collected.  Review of Systems Past Medical History:  Diagnosis Date  . COPD (chronic obstructive pulmonary disease) (HCC)   . PUD (peptic ulcer disease)     Past Surgical History:  Procedure Laterality Date  . APPENDECTOMY    . BALLOON DILATION N/A 03/05/2016   Procedure: BALLOON DILATION;  Surgeon: Malissa Hippo, MD;  [facility];  Service: Endoscopy;  Laterality: N/A;  pyloric channel dialtaion  . CHOLECYSTECTOMY    . ESOPHAGEAL DILATION N/A 03/03/2016   Procedure: ESOPHAGEAL DILATION;  Surgeon: Malissa Hippo, MD;  Location: [facility];  Service: Endoscopy;  Laterality: N/A;  . ESOPHAGEAL DILATION N/A 05/07/2016   Procedure: ESOPHAGEAL DILATION;  Surgeon: Malissa Hippo, MD;  [facility]: [facility];  Service: Endoscopy;  Laterality: N/A;  . ESOPHAGOGASTRODUODENOSCOPY N/A 03/03/2016   Procedure: ESOPHAGOGASTRODUODENOSCOPY (EGD);  Surgeon: Malissa Hippo, MD;  [facility];  Service: Endoscopy;  Laterality: N/A;  2:00  . ESOPHAGOGASTRODUODENOSCOPY N/A 05/07/2016   Procedure: ESOPHAGOGASTRODUODENOSCOPY (EGD);  Surgeon: Malissa Hippo, MD;  [facility];  Service: Endoscopy;  Laterality: N/A;  855 - moved to 6/2 @ 10:15 - Ann notified pt  . ESOPHAGOGASTRODUODENOSCOPY (EGD) WITH PROPOFOL N/A 03/05/2016   Procedure: ESOPHAGOGASTRODUODENOSCOPY (EGD) WITH PROPOFOL Anastomotic stricture dilation ;  Surgeon: Malissa Hippo, MD;  Location: [facility];  Service: Endoscopy;  Laterality: N/A;  to be done in OR under fluoro  . STOMACH SURGERY      Allergies  Allergen Reactions  . Penicillins     Tongue swells, itching Has patient had a PCN reaction causing immediate rash, facial/tongue/throat swelling, SOB or lightheadedness with hypotension: Yes, had a rash and tongue swelled up. No SOB and lightheadedness. Has patient had a PCN reaction causing severe rash involving mucus membranes or skin necrosis: No Has patient had a PCN reaction that  required hospitalization: No Has patient had a PCN reaction occurring within the last 10 years: No If all of the above answers are "NO", then may procee    Current Outpatient Prescriptions on File Prior to Visit  Medication Sig Dispense Refill  . albuterol (PROVENTIL HFA;VENTOLIN HFA) 108 (90 Base) MCG/ACT inhaler Inhale into the lungs every 6 (six) hours as needed for wheezing or shortness of breath.    . feeding supplement, ENSURE ENLIVE, (ENSURE ENLIVE) LIQD Take 237 mLs by mouth 2 (two) times daily between meals. 237 mL 12  . FLUoxetine (PROZAC) 40 MG capsule Take 40 mg by mouth daily.    . Multiple Vitamin (MULTIVITAMIN) tablet Take 1 tablet by mouth daily.    . pantoprazole (PROTONIX) 40 MG tablet Take 1 tablet (40 mg total) by mouth 2 (two) times daily before a meal. 60 tablet 0  . Potassium 99 MG TABS Take 99 mg by mouth daily.     Marland Kitchen tetrahydrozoline 0.05 % ophthalmic solution Place 1 drop into both eyes daily as needed (dry eyes).    Marland Kitchen  traZODone (DESYREL) 100 MG tablet Take 100 mg by mouth at bedtime.    Marland Kitchen Hydrocodone-Acetaminophen 5-300 MG TABS Take 1 tablet by mouth every 6 (six) hours as needed. (Patient not taking: Reported on 07/06/2016) 40 each 0  . Iron Combinations (CHROMAGEN) capsule Take 1 capsule by mouth daily. 45     No current facility-administered medications on file prior to visit.        Objective:   Physical Exam Blood pressure (!) 100/54, pulse (!) 56, temperature 98 F (36.7 C), height 5\' 2"  (1.575 m), weight 77 lb 12.8 oz (35.3 kg). Alert and oriented. Skin warm and dry. Oral mucosa is moist.   . Sclera anicteric, conjunctivae is pink. Thyroid not enlarged. No cervical lymphadenopathy. Lungs clear. Heart regular rate and rhythm.  Abdomen is soft. Bowel sounds are positive. No hepatomegaly. No abdominal masses felt. No tenderness.  No edema to lower extremities.          Assessment & Plan:  Gastric out obstruction. Last dilatation in June. She has gained 10  pounds. She will have OV in 3 months. She will continue to drink Boost or Ensure daily. If any problems with dysphagia, she will call our office.

## 2016-07-06 NOTE — Patient Instructions (Addendum)
Continue to Protonix. Continue the Ensure or Boost daily. If any problems with dysphagia, call our office.

## 2016-07-09 ENCOUNTER — Ambulatory Visit (HOSPITAL_COMMUNITY)
Admission: RE | Admit: 2016-07-09 | Discharge: 2016-07-09 | Disposition: A | Discharge status: Home / Self Care | Payer: Self-pay | Source: Ambulatory Visit | Attending: Family | Admitting: Family

## 2016-07-09 DIAGNOSIS — Z780 Asymptomatic menopausal state: Secondary | ICD-10-CM | POA: Insufficient documentation

## 2016-07-09 DIAGNOSIS — M810 Age-related osteoporosis without current pathological fracture: Secondary | ICD-10-CM | POA: Insufficient documentation

## 2016-08-16 ENCOUNTER — Encounter (INDEPENDENT_AMBULATORY_CARE_PROVIDER_SITE_OTHER): Payer: Self-pay

## 2016-10-06 ENCOUNTER — Ambulatory Visit (INDEPENDENT_AMBULATORY_CARE_PROVIDER_SITE_OTHER): Payer: Self-pay | Admitting: Internal Medicine

## 2016-11-18 ENCOUNTER — Ambulatory Visit (INDEPENDENT_AMBULATORY_CARE_PROVIDER_SITE_OTHER): Payer: Self-pay | Admitting: Internal Medicine

## 2016-12-07 ENCOUNTER — Ambulatory Visit (INDEPENDENT_AMBULATORY_CARE_PROVIDER_SITE_OTHER): Payer: Self-pay | Admitting: Internal Medicine

## 2016-12-21 ENCOUNTER — Ambulatory Visit (INDEPENDENT_AMBULATORY_CARE_PROVIDER_SITE_OTHER): Payer: Self-pay | Admitting: Internal Medicine

## 2017-12-26 ENCOUNTER — Emergency Department (HOSPITAL_COMMUNITY): Payer: Medicaid Other

## 2017-12-26 ENCOUNTER — Inpatient Hospital Stay (HOSPITAL_COMMUNITY)
Admission: EM | Admit: 2017-12-26 | Discharge: 2017-12-30 | DRG: 393 | Disposition: A | Discharge status: Home / Self Care | Payer: Medicaid Other | Attending: Family Medicine | Admitting: Family Medicine

## 2017-12-26 ENCOUNTER — Encounter (HOSPITAL_COMMUNITY): Payer: Self-pay | Admitting: Emergency Medicine

## 2017-12-26 ENCOUNTER — Other Ambulatory Visit: Payer: Self-pay

## 2017-12-26 DIAGNOSIS — E43 Unspecified severe protein-calorie malnutrition: Secondary | ICD-10-CM | POA: Diagnosis present

## 2017-12-26 DIAGNOSIS — L89151 Pressure ulcer of sacral region, stage 1: Secondary | ICD-10-CM | POA: Diagnosis present

## 2017-12-26 DIAGNOSIS — J449 Chronic obstructive pulmonary disease, unspecified: Secondary | ICD-10-CM | POA: Diagnosis present

## 2017-12-26 DIAGNOSIS — M1990 Unspecified osteoarthritis, unspecified site: Secondary | ICD-10-CM | POA: Diagnosis present

## 2017-12-26 DIAGNOSIS — K9189 Other postprocedural complications and disorders of digestive system: Principal | ICD-10-CM | POA: Diagnosis present

## 2017-12-26 DIAGNOSIS — I959 Hypotension, unspecified: Secondary | ICD-10-CM | POA: Diagnosis present

## 2017-12-26 DIAGNOSIS — L8990 Pressure ulcer of unspecified site, unspecified stage: Secondary | ICD-10-CM

## 2017-12-26 DIAGNOSIS — K210 Gastro-esophageal reflux disease with esophagitis: Secondary | ICD-10-CM | POA: Diagnosis present

## 2017-12-26 DIAGNOSIS — L89892 Pressure ulcer of other site, stage 2: Secondary | ICD-10-CM | POA: Diagnosis present

## 2017-12-26 DIAGNOSIS — E878 Other disorders of electrolyte and fluid balance, not elsewhere classified: Secondary | ICD-10-CM | POA: Diagnosis present

## 2017-12-26 DIAGNOSIS — R001 Bradycardia, unspecified: Secondary | ICD-10-CM | POA: Diagnosis present

## 2017-12-26 DIAGNOSIS — E875 Hyperkalemia: Secondary | ICD-10-CM | POA: Diagnosis not present

## 2017-12-26 DIAGNOSIS — R64 Cachexia: Secondary | ICD-10-CM | POA: Diagnosis present

## 2017-12-26 DIAGNOSIS — Z681 Body mass index (BMI) 19 or less, adult: Secondary | ICD-10-CM

## 2017-12-26 DIAGNOSIS — T39395A Adverse effect of other nonsteroidal anti-inflammatory drugs [NSAID], initial encounter: Secondary | ICD-10-CM | POA: Diagnosis present

## 2017-12-26 DIAGNOSIS — I4581 Long QT syndrome: Secondary | ICD-10-CM | POA: Diagnosis present

## 2017-12-26 DIAGNOSIS — K259 Gastric ulcer, unspecified as acute or chronic, without hemorrhage or perforation: Secondary | ICD-10-CM | POA: Diagnosis present

## 2017-12-26 DIAGNOSIS — R1319 Other dysphagia: Secondary | ICD-10-CM

## 2017-12-26 DIAGNOSIS — E871 Hypo-osmolality and hyponatremia: Secondary | ICD-10-CM | POA: Diagnosis present

## 2017-12-26 DIAGNOSIS — Z8711 Personal history of peptic ulcer disease: Secondary | ICD-10-CM

## 2017-12-26 DIAGNOSIS — F1010 Alcohol abuse, uncomplicated: Secondary | ICD-10-CM | POA: Diagnosis present

## 2017-12-26 DIAGNOSIS — Z880 Allergy status to penicillin: Secondary | ICD-10-CM

## 2017-12-26 DIAGNOSIS — R634 Abnormal weight loss: Secondary | ICD-10-CM

## 2017-12-26 DIAGNOSIS — R1310 Dysphagia, unspecified: Secondary | ICD-10-CM

## 2017-12-26 DIAGNOSIS — D649 Anemia, unspecified: Secondary | ICD-10-CM | POA: Diagnosis present

## 2017-12-26 DIAGNOSIS — G8929 Other chronic pain: Secondary | ICD-10-CM | POA: Diagnosis present

## 2017-12-26 DIAGNOSIS — Z79899 Other long term (current) drug therapy: Secondary | ICD-10-CM

## 2017-12-26 DIAGNOSIS — K449 Diaphragmatic hernia without obstruction or gangrene: Secondary | ICD-10-CM | POA: Diagnosis present

## 2017-12-26 DIAGNOSIS — E876 Hypokalemia: Secondary | ICD-10-CM | POA: Diagnosis present

## 2017-12-26 DIAGNOSIS — R9431 Abnormal electrocardiogram [ECG] [EKG]: Secondary | ICD-10-CM | POA: Diagnosis present

## 2017-12-26 DIAGNOSIS — M810 Age-related osteoporosis without current pathological fracture: Secondary | ICD-10-CM | POA: Diagnosis present

## 2017-12-26 DIAGNOSIS — K311 Adult hypertrophic pyloric stenosis: Secondary | ICD-10-CM

## 2017-12-26 DIAGNOSIS — M545 Low back pain: Secondary | ICD-10-CM | POA: Diagnosis present

## 2017-12-26 DIAGNOSIS — R112 Nausea with vomiting, unspecified: Secondary | ICD-10-CM

## 2017-12-26 DIAGNOSIS — F17210 Nicotine dependence, cigarettes, uncomplicated: Secondary | ICD-10-CM | POA: Diagnosis present

## 2017-12-26 DIAGNOSIS — E860 Dehydration: Secondary | ICD-10-CM | POA: Diagnosis present

## 2017-12-26 DIAGNOSIS — Z720 Tobacco use: Secondary | ICD-10-CM | POA: Diagnosis present

## 2017-12-26 DIAGNOSIS — R531 Weakness: Secondary | ICD-10-CM

## 2017-12-26 HISTORY — DX: Diaphragmatic hernia without obstruction or gangrene: K44.9

## 2017-12-26 HISTORY — DX: Other diseases of stomach and duodenum: K31.89

## 2017-12-26 HISTORY — DX: Esophagitis, unspecified: K20.9

## 2017-12-26 HISTORY — DX: Dysphagia, unspecified: R13.10

## 2017-12-26 HISTORY — DX: Esophagitis, unspecified without bleeding: K20.90

## 2017-12-26 HISTORY — DX: Adult hypertrophic pyloric stenosis: K31.1

## 2017-12-26 LAB — CBC WITH DIFFERENTIAL/PLATELET
BASOS ABS: 0 10*3/uL (ref 0.0–0.1)
Basophils Relative: 1 %
EOS PCT: 1 %
Eosinophils Absolute: 0.1 10*3/uL (ref 0.0–0.7)
HEMATOCRIT: 32.4 % — AB (ref 36.0–46.0)
Hemoglobin: 10.2 g/dL — ABNORMAL LOW (ref 12.0–15.0)
LYMPHS PCT: 30 %
Lymphs Abs: 1.5 10*3/uL (ref 0.7–4.0)
MCH: 29.2 pg (ref 26.0–34.0)
MCHC: 31.5 g/dL (ref 30.0–36.0)
MCV: 92.8 fL (ref 78.0–100.0)
Monocytes Absolute: 0.5 10*3/uL (ref 0.1–1.0)
Monocytes Relative: 11 %
NEUTROS ABS: 2.9 10*3/uL (ref 1.7–7.7)
Neutrophils Relative %: 57 %
PLATELETS: 268 10*3/uL (ref 150–400)
RBC: 3.49 MIL/uL — AB (ref 3.87–5.11)
RDW: 17.6 % — ABNORMAL HIGH (ref 11.5–15.5)
WBC: 5 10*3/uL (ref 4.0–10.5)

## 2017-12-26 LAB — IRON AND TIBC
Iron: 44 ug/dL (ref 28–170)
Saturation Ratios: 22 % (ref 10.4–31.8)
TIBC: 202 ug/dL — AB (ref 250–450)
UIBC: 158 ug/dL

## 2017-12-26 LAB — COMPREHENSIVE METABOLIC PANEL
ALT: 18 U/L (ref 14–54)
AST: 27 U/L (ref 15–41)
Albumin: 2.2 g/dL — ABNORMAL LOW (ref 3.5–5.0)
Alkaline Phosphatase: 64 U/L (ref 38–126)
Anion gap: 10 (ref 5–15)
BUN: 23 mg/dL — ABNORMAL HIGH (ref 6–20)
CHLORIDE: 89 mmol/L — AB (ref 101–111)
CO2: 33 mmol/L — ABNORMAL HIGH (ref 22–32)
Calcium: 7.8 mg/dL — ABNORMAL LOW (ref 8.9–10.3)
Creatinine, Ser: 0.71 mg/dL (ref 0.44–1.00)
GFR calc Af Amer: >60 mL/min (ref >60)
Glucose, Bld: 76 mg/dL (ref 65–99)
Potassium: 2.6 mmol/L — CL (ref 3.5–5.1)
Sodium: 132 mmol/L — ABNORMAL LOW (ref 135–145)
Total Bilirubin: 0.3 mg/dL (ref 0.3–1.2)
Total Protein: 5.1 g/dL — ABNORMAL LOW (ref 6.5–8.1)

## 2017-12-26 LAB — URINALYSIS, ROUTINE W REFLEX MICROSCOPIC
Bilirubin Urine: NEGATIVE
GLUCOSE, UA: NEGATIVE mg/dL
Hgb urine dipstick: NEGATIVE
Ketones, ur: 5 mg/dL — AB
LEUKOCYTES UA: NEGATIVE
Nitrite: NEGATIVE
PROTEIN: NEGATIVE mg/dL
Specific Gravity, Urine: 1.026 (ref 1.005–1.030)
pH: 5 (ref 5.0–8.0)

## 2017-12-26 LAB — POC OCCULT BLOOD, ED: Fecal Occult Bld: NEGATIVE

## 2017-12-26 LAB — MAGNESIUM: MAGNESIUM: 2 mg/dL (ref 1.7–2.4)

## 2017-12-26 LAB — VITAMIN B12: VITAMIN B 12: 541 pg/mL (ref 180–914)

## 2017-12-26 LAB — LIPASE, BLOOD: LIPASE: 18 U/L (ref 11–51)

## 2017-12-26 MED ORDER — FOLIC ACID 1 MG PO TABS
1.0000 mg | ORAL_TABLET | Freq: Every day | ORAL | Status: DC
  Administered 2017-12-26 – 2017-12-30 (×4): 1 mg via ORAL
  Filled 2017-12-26 (×5): qty 1

## 2017-12-26 MED ORDER — ONDANSETRON HCL 4 MG PO TABS
4.0000 mg | ORAL_TABLET | Freq: Once | ORAL | Status: AC
  Administered 2017-12-26: 4 mg via ORAL
  Filled 2017-12-26: qty 1

## 2017-12-26 MED ORDER — ENOXAPARIN SODIUM 30 MG/0.3ML ~~LOC~~ SOLN
30.0000 mg | SUBCUTANEOUS | Status: DC
  Administered 2017-12-26 – 2017-12-28 (×3): 30 mg via SUBCUTANEOUS
  Filled 2017-12-26 (×3): qty 0.3

## 2017-12-26 MED ORDER — ADULT MULTIVITAMIN W/MINERALS CH
1.0000 | ORAL_TABLET | Freq: Every day | ORAL | Status: DC
  Administered 2017-12-26 – 2017-12-30 (×4): 1 via ORAL
  Filled 2017-12-26 (×5): qty 1

## 2017-12-26 MED ORDER — LORAZEPAM 1 MG PO TABS
1.0000 mg | ORAL_TABLET | Freq: Four times a day (QID) | ORAL | Status: AC | PRN
  Administered 2017-12-28: 1 mg via ORAL
  Filled 2017-12-26: qty 1

## 2017-12-26 MED ORDER — LORAZEPAM 2 MG/ML IJ SOLN
1.0000 mg | Freq: Four times a day (QID) | INTRAMUSCULAR | Status: AC | PRN
  Administered 2017-12-28 – 2017-12-29 (×2): 1 mg via INTRAVENOUS
  Filled 2017-12-26 (×2): qty 1

## 2017-12-26 MED ORDER — POTASSIUM CHLORIDE 10 MEQ/100ML IV SOLN
10.0000 meq | Freq: Once | INTRAVENOUS | Status: AC
  Administered 2017-12-26: 10 meq via INTRAVENOUS
  Filled 2017-12-26: qty 100

## 2017-12-26 MED ORDER — SODIUM CHLORIDE 0.9 % IV SOLN: 1000.0000 mL | INTRAVENOUS | Status: DC

## 2017-12-26 MED ORDER — ONDANSETRON HCL 4 MG/2ML IJ SOLN: 4.0000 mg | Freq: Four times a day (QID) | INTRAMUSCULAR | Status: DC | PRN

## 2017-12-26 MED ORDER — POTASSIUM CHLORIDE 10 MEQ/100ML IV SOLN
10.0000 meq | INTRAVENOUS | Status: AC
  Administered 2017-12-26 (×4): 10 meq via INTRAVENOUS
  Filled 2017-12-26 (×4): qty 100

## 2017-12-26 MED ORDER — POTASSIUM CHLORIDE CRYS ER 20 MEQ PO TBCR
40.0000 meq | EXTENDED_RELEASE_TABLET | Freq: Once | ORAL | Status: AC
  Administered 2017-12-26: 40 meq via ORAL
  Filled 2017-12-26: qty 2

## 2017-12-26 MED ORDER — VITAMIN B-1 100 MG PO TABS
100.0000 mg | ORAL_TABLET | Freq: Every day | ORAL | Status: DC
  Administered 2017-12-26 – 2017-12-30 (×4): 100 mg via ORAL
  Filled 2017-12-26 (×5): qty 1

## 2017-12-26 MED ORDER — ALBUTEROL SULFATE (2.5 MG/3ML) 0.083% IN NEBU: 2.5000 mg | INHALATION_SOLUTION | Freq: Four times a day (QID) | RESPIRATORY_TRACT | Status: DC | PRN

## 2017-12-26 MED ORDER — ACETAMINOPHEN 325 MG PO TABS
650.0000 mg | ORAL_TABLET | Freq: Four times a day (QID) | ORAL | Status: DC | PRN
  Administered 2017-12-27 – 2017-12-30 (×10): 650 mg via ORAL
  Filled 2017-12-26 (×10): qty 2

## 2017-12-26 MED ORDER — PANTOPRAZOLE SODIUM 40 MG IV SOLR
40.0000 mg | Freq: Two times a day (BID) | INTRAVENOUS | Status: DC
  Administered 2017-12-26 – 2017-12-29 (×7): 40 mg via INTRAVENOUS
  Filled 2017-12-26 (×9): qty 40

## 2017-12-26 MED ORDER — SODIUM CHLORIDE 0.9 % IV BOLUS (SEPSIS): 1000.0000 mL | Freq: Once | INTRAVENOUS | Status: AC

## 2017-12-26 MED ORDER — SODIUM CHLORIDE 0.9 % IV BOLUS (SEPSIS)
500.0000 mL | Freq: Once | INTRAVENOUS | Status: AC
  Administered 2017-12-26: 500 mL via INTRAVENOUS

## 2017-12-26 MED ORDER — ONDANSETRON HCL 4 MG PO TABS: 4.0000 mg | ORAL_TABLET | Freq: Four times a day (QID) | ORAL | Status: DC | PRN

## 2017-12-26 MED ORDER — ENSURE ENLIVE PO LIQD
237.0000 mL | Freq: Two times a day (BID) | ORAL | Status: DC
  Administered 2017-12-26 – 2017-12-30 (×4): 237 mL via ORAL

## 2017-12-26 MED ORDER — SODIUM CHLORIDE 0.9 % IV SOLN
INTRAVENOUS | Status: DC
  Filled 2017-12-26 (×4): qty 1000

## 2017-12-26 MED ORDER — THIAMINE HCL 100 MG/ML IJ SOLN: 100.0000 mg | Freq: Every day | INTRAMUSCULAR | Status: DC

## 2017-12-26 MED ORDER — ALBUTEROL SULFATE HFA 108 (90 BASE) MCG/ACT IN AERS: 2.0000 | INHALATION_SPRAY | Freq: Four times a day (QID) | RESPIRATORY_TRACT | Status: DC | PRN

## 2017-12-26 MED ORDER — ACETAMINOPHEN 650 MG RE SUPP: 650.0000 mg | Freq: Four times a day (QID) | RECTAL | Status: DC | PRN

## 2017-12-26 MED ORDER — NICOTINE 21 MG/24HR TD PT24
21.0000 mg | MEDICATED_PATCH | Freq: Every day | TRANSDERMAL | Status: DC
Start: 2017-12-26 — End: 2017-12-30
  Administered 2017-12-26 – 2017-12-30 (×4): 21 mg via TRANSDERMAL
  Filled 2017-12-26 (×5): qty 1

## 2017-12-26 MED ORDER — POTASSIUM CHLORIDE 10 MEQ/100ML IV SOLN
INTRAVENOUS | Status: AC
  Filled 2017-12-26: qty 100

## 2017-12-26 MED ORDER — MAGNESIUM SULFATE 2 GM/50ML IV SOLN
2.0000 g | Freq: Once | INTRAVENOUS | Status: AC
  Administered 2017-12-26: 2 g via INTRAVENOUS
  Filled 2017-12-26: qty 50

## 2017-12-26 MED ORDER — NAPHAZOLINE-PHENIRAMINE 0.025-0.3 % OP SOLN
1.0000 [drp] | Freq: Four times a day (QID) | OPHTHALMIC | Status: DC | PRN
  Filled 2017-12-26: qty 5

## 2017-12-26 MED ORDER — SODIUM CHLORIDE 0.9 % IV BOLUS (SEPSIS)
1000.0000 mL | Freq: Once | INTRAVENOUS | Status: AC
  Administered 2017-12-26: 1000 mL via INTRAVENOUS

## 2017-12-26 MED ORDER — POTASSIUM CHLORIDE IN NACL 40-0.9 MEQ/L-% IV SOLN
INTRAVENOUS | Status: DC
Start: 2017-12-26 — End: 2017-12-27
  Administered 2017-12-26 – 2017-12-27 (×3): 125 mL/h via INTRAVENOUS

## 2017-12-26 NOTE — ED Provider Notes (Signed)
[facility] EMERGENCY DEPARTMENT Provider Note   CSN: [id number] Arrival date & time: 12/26/17  0375     History   Chief Complaint Chief Complaint  Patient presents with  . Weight Loss    HPI Kristine Roberts is a 62 y.o. female.  Patient is a 62 year old female who presents to the emergency department with a complaint of weakness and weight loss.  The patient's provider is the local health department.  The patient states that she has a history of chronic lung disease, esophagitis, gastric outlet obstruction, and weight loss.  She also has a history of infection involving the right lower extremity.  She states that she was seen by the physicians at the health department less than a week ago and she was told to come to the emergency department for workup and evaluation before receiving any antibiotics.  The patient complains of having problems with her right leg for approximately 3 months.  She states a box hit the leg and she thinks that she got some infection.  She now feels as though there is fluid in the leg.  She says there is redness around the area where that she had the injury.  She was told that she may need antibiotics, but her doctors were concerned about dehydration and wanted her evaluated in the emergency department.  The patient also complains of weakness and dehydration.  She states that she has an episode of vomiting almost daily.  She says this is very similar to when she had obstruction in her GI tract.  She had to have her esophagus stretched 2 times within the last 2 years.  She is not been seen by the gastroenterologist for approximately a year and a half according to the patient.  No blood in the vomitus.  No blood in the stools.  The patient complains of a generalized type weakness, states she has had some near falls on multiple occasions.  She does not feel like getting off the couch.  She sleeps a lot during the day and she is not eating a whole lot because she is afraid of  vomiting.  She says even liquids come back up.  It is of note that the patient is a smoker.  It is also of note that the patient has a daily use of beer.  She denies any recreational drugs.  It is also of note that she takes 325 mg of aspirin 3 times a day, and that she uses Goody powders 2 times daily every other day.  She presents now for evaluation of these problems.   The history is provided by the patient.    Past Medical History:  Diagnosis Date  . COPD (chronic obstructive pulmonary disease) (HCC)   . Dysphagia   . Esophagitis   . Gastric outlet obstruction   . Gastric stenosis   . Hiatal hernia   . PUD (peptic ulcer disease)     Patient Active Problem List   Diagnosis Date Noted  . Protein-calorie malnutrition, severe 03/05/2016  . Gastric outlet obstruction 03/03/2016  . COPD (chronic obstructive pulmonary disease) (HCC) 03/03/2016  . Dysphagia 02/03/2016    Past Surgical History:  Procedure Laterality Date  . APPENDECTOMY    . BALLOON DILATION N/A 03/05/2016   Procedure: BALLOON DILATION;  Surgeon: Malissa Hippo, MD;  [facility];  Service: Endoscopy;  Laterality: N/A;  pyloric channel dialtaion  . CHOLECYSTECTOMY    . ESOPHAGEAL DILATION N/A 03/03/2016   Procedure: ESOPHAGEAL DILATION;  Surgeon: Malissa Hippo, MD;  [facility];  Service: Endoscopy;  Laterality: N/A;  . ESOPHAGEAL DILATION N/A 05/07/2016   Procedure: ESOPHAGEAL DILATION;  Surgeon: Malissa Hippo, MD;  [facility];  Service: Endoscopy;  Laterality: N/A;  . ESOPHAGOGASTRODUODENOSCOPY N/A 03/03/2016   Procedure: ESOPHAGOGASTRODUODENOSCOPY (EGD);  Surgeon: Malissa Hippo, MD;  [facility];  Service: Endoscopy;  Laterality: N/A;  2:00  . ESOPHAGOGASTRODUODENOSCOPY N/A 05/07/2016   Procedure: ESOPHAGOGASTRODUODENOSCOPY (EGD);  Surgeon: Malissa Hippo, MD;  [facility];  Service: Endoscopy;  Laterality: N/A;  855 - moved to 6/2 @ 10:15 - Ann notified  pt  . ESOPHAGOGASTRODUODENOSCOPY (EGD) WITH PROPOFOL N/A 03/05/2016   Procedure: ESOPHAGOGASTRODUODENOSCOPY (EGD) WITH PROPOFOL Anastomotic stricture dilation ;  Surgeon: Malissa Hippo, MD;  [facility];  Service: Endoscopy;  Laterality: N/A;  to be done in OR under fluoro  . STOMACH SURGERY      OB History    No data available       Home Medications    Prior to Admission medications   Medication Sig Start Date End Date Taking? Authorizing Provider  albuterol (PROVENTIL HFA;VENTOLIN HFA) 108 (90 Base) MCG/ACT inhaler Inhale into the lungs every 6 (six) hours as needed for wheezing or shortness of breath.    [provider]  feeding supplement, ENSURE ENLIVE, (ENSURE ENLIVE) LIQD Take 237 mLs by mouth 2 (two) times daily between meals. 03/06/16   Regalado, Belkys A, MD  FLUoxetine (PROZAC) 40 MG capsule Take 40 mg by mouth daily.    [provider]  Hydrocodone-Acetaminophen 5-300 MG TABS Take 1 tablet by mouth every 6 (six) hours as needed. Patient not taking: Reported on 07/06/2016 03/06/16   Hartley Barefoot A, MD  Iron Combinations (CHROMAGEN) capsule Take 1 capsule by mouth daily. 45    [provider]  Multiple Vitamin (MULTIVITAMIN) tablet Take 1 tablet by mouth daily.    [provider]  pantoprazole (PROTONIX) 40 MG tablet Take 1 tablet (40 mg total) by mouth 2 (two) times daily before a meal. 03/06/16   Regalado, Belkys A, MD  Potassium 99 MG TABS Take 99 mg by mouth daily.     [provider]  tetrahydrozoline 0.05 % ophthalmic solution Place 1 drop into both eyes daily as needed (dry eyes).    [provider]  traZODone (DESYREL) 100 MG tablet Take 100 mg by mouth at bedtime.    [provider]    Family History History reviewed. No pertinent family history.  Social History Social History   Tobacco Use  . Smoking status: Current Every Day Smoker    Packs/day: 1.00    Years: 20.00    Pack years: 20.00     Types: Cigarettes  Substance Use Topics  . Alcohol use: Yes    Alcohol/week: 7.2 oz    Types: 12 Cans of beer per week    Comment: beer two a day.   . Drug use: No     Allergies   Penicillins   Review of Systems Review of Systems  Constitutional: Positive for appetite change and fatigue. Negative for activity change.       All ROS Neg except as noted in HPI  Weight loss  HENT: Negative for nosebleeds.   Eyes: Negative for photophobia and discharge.  Respiratory: Negative for cough, shortness of breath and wheezing.   Cardiovascular: Negative for chest pain and palpitations.  Gastrointestinal: Positive for vomiting. Negative  for abdominal pain and blood in stool.  Genitourinary: Negative for dysuria, frequency and hematuria.  Musculoskeletal: Negative for arthralgias, back pain and neck pain.  Skin: Positive for wound.  Neurological: Positive for weakness. Negative for dizziness, seizures and speech difficulty.  Psychiatric/Behavioral: Negative for confusion and hallucinations.     Physical Exam Updated Vital Signs BP 98/64   Pulse 65   Temp 98.1 F (36.7 C) (Oral)   Resp 19   Ht 5\' 1"  (1.549 m)   Wt 27.2 kg (60 lb)   SpO2 100%   BMI 11.34 kg/m   Physical Exam  Constitutional: She is oriented to person, place, and time. She appears well-developed and well-nourished.  Non-toxic appearance.  Patient is cachectic and somewhat pale.  HENT:  Head: Normocephalic.  Right Ear: Tympanic membrane and external ear normal.  Left Ear: Tympanic membrane and external ear normal.  Eyes: EOM and lids are normal. Pupils are equal, round, and reactive to light.  Neck: Normal range of motion. Neck supple. Carotid bruit is not present.  Cardiovascular: Normal rate, regular rhythm, normal heart sounds, intact distal pulses and normal pulses.  Pulmonary/Chest: Breath sounds normal. No respiratory distress.  Coarse breath sounds with a few scattered rhonchi present.  Symmetrical  rise and fall of the chest.  Patient speaks in complete sentences without problem.  Abdominal: Soft. Bowel sounds are normal. There is no tenderness. There is no guarding.  Generalized soreness.  No hepatomegaly or splenomegaly appreciated.  No mass appreciated.  Musculoskeletal: Normal range of motion.  There is a scabbed wound on the medial aspect of the right lower leg.  There are no red streaks or drainage appreciated.  On the the streaks or drainage appreciated on the lateral aspect there are red splotches near the ankle in the lower portion of the extremity.  There are no red streaks appreciated.  The area is not hot.  There is some soreness present.  The dorsalis pedis pulse is 2+.  Capillary refill is 2 seconds.  Lymphadenopathy:       Head (right side): No submandibular adenopathy present.       Head (left side): No submandibular adenopathy present.    She has no cervical adenopathy.  Neurological: She is alert and oriented to person, place, and time. She has normal strength. No cranial nerve deficit or sensory deficit.  Skin: Skin is warm and dry.  Psychiatric: She has a normal mood and affect. Her speech is normal.  Nursing note and vitals reviewed.    ED Treatments / Results  Labs (all labs ordered are listed, but only abnormal results are displayed) Labs Reviewed  COMPREHENSIVE METABOLIC PANEL - Abnormal; Notable for the following components:      Result Value   Sodium 132 (*)    Potassium 2.6 (*)    Chloride 89 (*)    CO2 33 (*)    BUN 23 (*)    Calcium 7.8 (*)    Total Protein 5.1 (*)    Albumin 2.2 (*)    All other components within normal limits  CBC WITH DIFFERENTIAL/PLATELET - Abnormal; Notable for the following components:   RBC 3.49 (*)    Hemoglobin 10.2 (*)    HCT 32.4 (*)    RDW 17.6 (*)    All other components within normal limits  URINALYSIS, ROUTINE W REFLEX MICROSCOPIC - Abnormal; Notable for the following components:   Ketones, ur 5 (*)    All other  components within normal limits  LIPASE, BLOOD  MAGNESIUM  POC OCCULT BLOOD, ED    EKG  EKG Interpretation None       Radiology No results found.  Procedures Procedures (including critical care time)  Medications Ordered in ED Medications  sodium chloride 0.9 % bolus 500 mL (0 mLs Intravenous Stopped 12/26/17 1105)    Followed by  0.9 %  sodium chloride infusion (1,000 mLs Intravenous Rate/Dose Change 12/26/17 1106)  potassium chloride SA (K-DUR,KLOR-CON) CR tablet 40 mEq (not administered)  ondansetron (ZOFRAN) tablet 4 mg (not administered)  potassium chloride 10 mEq in 100 mL IVPB (not administered)     Initial Impression / Assessment and Plan / ED Course  I have reviewed the triage vital signs and the nursing notes.  Pertinent labs & imaging results that were available during my care of the patient were reviewed by me and considered in my medical decision making (see chart for details).    Case reviewed by Dr Clarene Duke.  Final Clinical Impressions(s) / ED Diagnoses Vital signs reviewed.  Stool for occult blood is found to be negative.  On the comprehensive metabolic panel, the sodium is low at 132, the potassium is critically low at 2.6, the chloride is low at 89, and the CO2 is high at 33.  The BUN is elevated at 23.  Potassium low at 2.6. Oral and IV potassium given to the patient in ED. the proteins, and albumin are both low.  Lipase is normal at 18.  The complete blood count shows the white blood cells to be normal at 5000, the hemoglobin and hematocrit are slightly low at 10.2 and 32.4 respectively.  There is no evidence of a shift to the left.  The platelets are normal at 268,000.  Urinalysis is within normal limits.  An acute abdomen shows no acute abnormality of the abdomen or chest.  Potassium critically low at 2.6, IV potassium and oral potassium is been given.  Patient is also given a 500 mL bolus of fluids.  I discussed the findings with the examination as well  as the findings on the labs and x-ray with the patient in terms which she understands .  I discussed the importance of admission to the hospital at this time given the various changes in labs in her condition.  Patient is in agreement with being admitted.  Call placed to Dr.Rehman - GI.  Call placed to [facility] hospitalist.  Patient to receive another bolus of IV fluids.  Patient is to be admitted to observation telemetry.  Call placed to Dr. Deland Pretty Dr Karilyn Cota will see pt on consultation.   Final diagnoses:  Hypokalemia  Weakness  Loss of weight    ED Discharge Orders    None       Ivery Quale, PA-C 12/26/17 2029    Samuel Jester, DO 12/28/17 1559

## 2017-12-26 NOTE — ED Notes (Signed)
Pt retunred from xray

## 2017-12-26 NOTE — ED Triage Notes (Signed)
Pt reports the health department sent her over for evaluation of weight loss and right leg swelling and redness.  This has been present x 3 months.

## 2017-12-26 NOTE — Consult Note (Signed)
Referring Provider: Eddie North, MD Primary Care Physician:  [facility] Primary Gastroenterologist:  Dr. Karilyn Cota  Reason for Consultation:    Recurrent vomiting and weight loss in a patient with complicated history of peptic ulcer disease who was hospitalized for profound hypokalemia.  HPI:   Patient is 62 year old Caucasian female who has a history of peptic ulcers for 20 years.  She had gastric surgery twice at [facility] in [facility] about 18 years ago.  She has developed anastomotic stricture due to recurrent peptic ulcer disease secondary to continued NSAID use. She has undergone anastomotic stricture dilation in March 2017 and again in June 2017.  She has done well after each dilation. She was last seen in the office on 07/06/2017 when she weighed 77.8 pounds.  She was doing well. She now presents with recurrent vomiting associated with weight loss.  She was seen at the [facility] department for weakness and weight loss.  She also complained of profound weakness. Patient was advised to go to emergency room.  Evaluation in the emergency room revealed her to have low serum potassium of 2.6. Hemoglobin was 10.2 and stool was guaiac negative.  Serum albumin was 2.2. Patient states she vomits at least twice a day.  She does not have nausea.  She has occasional heartburn.  She denies dysphagia or hematemesis.  She states she generally vomits food that she had just eaten.  She denies melena or rectal bleeding.  She also denies abdominal pain. Continues take BC or Goody powder.  On most days she takes 6 doses.  She takes it primarily for headache and back pain.  He knows that she is not supposed to do this. She has lost 17 pounds in the last 4-1/2 months.  She states she has good appetite.  She is single.  She lives with her boyfriend.  They have been together for 13 years.  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  She states she is applying for disability.  She smokes about a pack of cigarettes per day.  She states she has been smoking for 37 years.  She does not drink alcohol anymore. Her parents are disease.  Mother had dementia and lived to be 16.  Father died of throat cancer at age 39.  She has 2 younger sisters in good health.   Past Medical History:  Diagnosis Date  . COPD (chronic obstructive pulmonary disease) (HCC)   .  Low back pain.   . Esophagitis   .  History of gastric outlet obstruction secondary to anastomotic stricture.    Osteoporosis   .  Chronic insomnia.   . PUD (peptic ulcer disease)     Past Surgical History:  Procedure Laterality Date  . APPENDECTOMY    . BALLOON DILATION N/A 03/05/2016   Procedure: BALLOON DILATION;  Surgeon: Malissa Hippo, MD;  [facility];  Service: Endoscopy;  Laterality: N/A;  pyloric channel dialtaion  . CHOLECYSTECTOMY    . ESOPHAGEAL DILATION N/A 03/03/2016   Procedure: ESOPHAGEAL DILATION;  Surgeon: Malissa Hippo, MD;  [facility];  Service: Endoscopy;  Laterality: N/A;  . ESOPHAGEAL DILATION N/A 05/07/2016   Procedure: ESOPHAGEAL DILATION;  Surgeon: Malissa Hippo, MD;  [facility];  Service: Endoscopy;  Laterality: N/A;  . ESOPHAGOGASTRODUODENOSCOPY N/A 03/03/2016   Procedure: ESOPHAGOGASTRODUODENOSCOPY (EGD);  Surgeon: Malissa Hippo, MD;  [facility];  Service: Endoscopy;  Laterality: N/A;  2:00  . ESOPHAGOGASTRODUODENOSCOPY N/A 05/07/2016   Procedure: ESOPHAGOGASTRODUODENOSCOPY (EGD);  Surgeon: Malissa Hippo, MD;  [facility];  Service: Endoscopy;  Laterality: N/A;  855 - moved to 6/2 @ 10:15 - Ann notified pt  . ESOPHAGOGASTRODUODENOSCOPY (EGD) WITH PROPOFOL N/A 03/05/2016   Procedure: ESOPHAGOGASTRODUODENOSCOPY (EGD) WITH PROPOFOL Anastomotic stricture dilation ;  Surgeon: Malissa Hippo, MD;  [facility];  Service: Endoscopy;  Laterality: N/A;   to be done in OR under fluoro  . STOMACH SURGERY      Prior to Admission medications   Medication Sig Start Date End Date Taking? Authorizing Provider  albuterol (PROVENTIL HFA;VENTOLIN HFA) 108 (90 Base) MCG/ACT inhaler Inhale into the lungs every 6 (six) hours as needed for wheezing or shortness of breath.   Yes [provider]  cyclobenzaprine (FLEXERIL) 10 MG tablet Take 1 tablet by mouth daily. 12/22/17  Yes [provider]  DULERA 200-5 MCG/ACT AERO Inhale 1 puff into the lungs daily. 12/22/17  Yes [provider]  feeding supplement, ENSURE ENLIVE, (ENSURE ENLIVE) LIQD Take 237 mLs by mouth 2 (two) times daily between meals. 03/06/16  Yes Regalado, Belkys A, MD  Potassium 99 MG TABS Take 99 mg by mouth daily.    Yes [provider]  tetrahydrozoline 0.05 % ophthalmic solution Place 1 drop into both eyes daily as needed (dry eyes).   Yes [provider]  traZODone (DESYREL) 100 MG tablet Take 100 mg by mouth at bedtime.   Yes [provider]  Hydrocodone-Acetaminophen 5-300 MG TABS Take 1 tablet by mouth every 6 (six) hours as needed. Patient not taking: Reported on 07/06/2016 03/06/16   Regalado, Jon Billings A, MD  pantoprazole (PROTONIX) 40 MG tablet Take 1 tablet (40 mg total) by mouth 2 (two) times daily before a meal. Patient not taking: Reported on 12/26/2017 03/06/16   Alba Cory, MD    Current Facility-Administered Medications  Medication Dose Route Frequency Provider Last Rate Last Dose  . 0.9 % NaCl with KCl 40 mEq / L  infusion   Intravenous Continuous Dhungel, Nishant, MD 125 mL/hr at 12/26/17 1510 125 mL/hr at 12/26/17 1510  . acetaminophen (TYLENOL) tablet 650 mg  650 mg Oral Q6H PRN Dhungel, Nishant, MD       Or  . acetaminophen (TYLENOL) suppository 650 mg  650 mg Rectal Q6H PRN Dhungel, Nishant, MD      . albuterol (PROVENTIL) (2.5 MG/3ML) 0.083% nebulizer solution 2.5 mg  2.5 mg Nebulization Q6H PRN Dhungel, Nishant, MD       . enoxaparin (LOVENOX) injection 30 mg  30 mg Subcutaneous Q24H Dhungel, Nishant, MD      . feeding supplement (ENSURE ENLIVE) (ENSURE ENLIVE) liquid 237 mL  237 mL Oral BID BM Dhungel, Nishant, MD   237 mL at 12/26/17 1608  . folic acid (FOLVITE) tablet 1 mg  1 mg Oral Daily Dhungel, Nishant, MD   1 mg at 12/26/17 1602  . LORazepam (ATIVAN) tablet 1 mg  1 mg Oral Q6H PRN Dhungel, Nishant, MD       Or  . LORazepam (ATIVAN) injection 1 mg  1 mg Intravenous Q6H PRN Dhungel, Nishant, MD      . multivitamin with minerals tablet 1 tablet  1 tablet Oral Daily Dhungel, Nishant, MD   1 tablet at 12/26/17 1602  . naphazoline-pheniramine (NAPHCON-A) 0.025-0.3 % ophthalmic solution 1 drop  1 drop Both Eyes  QID PRN Dhungel, Nishant, MD      . nicotine (NICODERM CQ - dosed in mg/24 hours) patch 21 mg  21 mg Transdermal Daily Dhungel, Nishant, MD   21 mg at 12/26/17 1602  . ondansetron (ZOFRAN) tablet 4 mg  4 mg Oral Q6H PRN Dhungel, Nishant, MD       Or  . ondansetron (ZOFRAN) injection 4 mg  4 mg Intravenous Q6H PRN Dhungel, Nishant, MD      . pantoprazole (PROTONIX) injection 40 mg  40 mg Intravenous Q12H Dhungel, Nishant, MD   40 mg at 12/26/17 1603  . potassium chloride 10 mEq in 100 mL IVPB  10 mEq Intravenous Q1 Hr x 4 Dhungel, Nishant, MD      . thiamine (VITAMIN B-1) tablet 100 mg  100 mg Oral Daily Dhungel, Nishant, MD   100 mg at 12/26/17 1602   Or  . thiamine (B-1) injection 100 mg  100 mg Intravenous Daily Dhungel, Nishant, MD        Allergies as of 12/26/2017 - Review Complete 12/26/2017  Allergen Reaction Noted  . Penicillins  02/03/2016    History reviewed. No pertinent family history.  Social History   Socioeconomic History  . Marital status: Widowed    Spouse name: Not on file  . Number of children: Not on file  . Years of education: Not on file  . Highest education level: Not on file  Social Needs  . Financial resource strain: Not on file  . Food insecurity - worry: Not on  file  . Food insecurity - inability: Not on file  . Transportation needs - medical: Not on file  . Transportation needs - non-medical: Not on file  Occupational History  . Not on file  Tobacco Use  . Smoking status: Current Every Day Smoker    Packs/day: 1.00    Years: 20.00    Pack years: 20.00    Types: Cigarettes  . Smokeless tobacco: Never Used  Substance and Sexual Activity  . Alcohol use: Yes    Alcohol/week: 7.2 oz    Types: 12 Cans of beer per week    Comment: beer two a day.   . Drug use: No  . Sexual activity: Not on file  Other Topics Concern  . Not on file  Social History Narrative  . Not on file    Review of Systems: See HPI, otherwise normal ROS  Physical Exam: Temp:  [97.8 F (36.6 C)-98.1 F (36.7 C)] 97.8 F (36.6 C) (01/21 1437) Pulse Rate:  [44-81] 62 (01/21 1437) Resp:  [14-19] 18 (01/21 1437) BP: (84-101)/(53-70) 89/53 (01/21 1437) SpO2:  [95 %-100 %] 98 % (01/21 1437) Weight:  [60 lb (27.2 kg)] 60 lb (27.2 kg) (01/21 0909) Last BM Date: 12/26/17  Cachectic appearing Caucasian female in NAD. Appears older than stated age. Conjunctivae is pink.  Sclerae nonicteric. Oropharyngeal mucosa is normal.  She is edentulous. No neck masses or thyromegaly noted. Cardiac exam with regular rhythm normal S1 and S2.  No murmur or gallop noted. Lungs are clear to auscultation. Abdomen is flat.  She has long right subcostal scar. Also normal.  On palpation abdomen is soft and nontender without organomegaly or masses. Extremities are thin with muscle wasting but no clubbing noted.  Lab Results: Recent Labs    12/26/17 1015  WBC 5.0  HGB 10.2*  HCT 32.4*  PLT 268   BMET Recent Labs    12/26/17 1015  NA 132*  K 2.6*  CL 89*  CO2 33*  GLUCOSE 76  BUN 23*  CREATININE 0.71  CALCIUM 7.8*   LFT Recent Labs    12/26/17 1015  PROT 5.1*  ALBUMIN 2.2*  AST 27  ALT 18  ALKPHOS 64  BILITOT 0.3    Studies/Results: Dg Abd Acute  W/chest  Result Date: 12/26/2017 CLINICAL DATA:  Nausea and vomiting for 4 years. Weight loss since July. EXAM: DG ABDOMEN ACUTE W/ 1V CHEST COMPARISON:  None. FINDINGS: Normal heart size and mediastinal contours. No acute infiltrate or edema. Small calcified pulmonary granulomas. No effusion or pneumothorax. Nonobstructive bowel gas pattern. Bowel sutures over the epigastrium and pelvis. Upper abdominal surgical clips. No concerning mass effect or gas collection. No evidence of pneumoperitoneum. Osteopenic appearance. Thoracolumbar levoscoliosis. Remote bilateral rib fractures. IMPRESSION: No acute finding in the chest or abdomen. Electronically Signed   By: Marnee Spring M.D.   On: 12/26/2017 11:09   Acute abdominal series reviewed.  No gastric dilation noted.  Assessment;  Patient is 62 year old Caucasian female with complicated history of peptic ulcer disease with 2 surgeries more than 15 years ago who has developed gastro duodenal anastomotic stricture requiring dilation periodically most recently in June 2017 who presents with a few weeks history of vomiting and 17 pound weight loss in the last 4 months.  Suspect recurrent anastomotic stricture due to continued NSAID use.  She will benefit from repeat EGD and stricture dilation which has been beneficial.  Long-term prognosis not favorable unless she stops using NSAIDs.  She is anemic but her stool is guaiac negative.  Cachexia.  Secondary to complicated peptic ulcer disease and chronic vomiting.  Hypokalemia secondary to recurrent vomiting and diminished oral intake.  Recommendations;  Discontinue oral KCl because of its topical gastric toxicity. Therapeutic esophagogastroduodenoscopy once metabolic abnormalities corrected. Will advance diet to full liquids if she tolerates clear liquids.   LOS: 0 days   Najeeb Rehman  12/26/2017, 5:17 PM

## 2017-12-26 NOTE — ED Notes (Signed)
Pt states has been seeing Dr Karilyn Cota for weight loss x 4 yrs. Pt stated started at 125 lbs. And has lost approx 16 lbs since this July. Pt is emaciated. No weakness noted.

## 2017-12-26 NOTE — ED Notes (Signed)
CRITICAL VALUE ALERT  Critical Value:  Potassium 2.6  Date & Time Notied:  12/26/17 1102  Provider Notified: h bryant  Orders Received/Actions taken:

## 2017-12-26 NOTE — ED Notes (Signed)
Pt receiving bolus from first bag of , then will start  for liter bolus

## 2017-12-26 NOTE — Progress Notes (Signed)
RN spoke with Dr. Craige Cotta. She stated pt would have to speak with Her hospitalist tomorrow about Flexeril prescription d/t pts weight loss and not knowing when her prescription was written.  Also pts BP decreased earlier to the 80s.  Current BP 90/57. RN educated pt, pt verbalized understanding. Will continue to monitor pt

## 2017-12-26 NOTE — H&P (Signed)
TRH H&P   Patient Demographics:    Kristine Roberts, is a 62 y.o. female  MRN: [id number]   DOB - [birth year]  Admit Date - 12/26/2017  Outpatient Primary MD for the patient is [facility]  Referring MD: ED  Outpatient Specialists: Dr. Karilyn Cota    Patient coming from: Home (sent by physician at health department)  Chief Complaint  Patient presents with  . Weight Loss      HPI:    Kristine Roberts  is a 62 y.o. female, with history of history of Billroth type I in? 2001 for peptic ulcer disease, ongoing tobacco and alcohol use who follows with GI Dr Karilyn Cota (last EGD in 05/2016) for dysphagia and significant weight loss was sent to the ED by physician at the health department for ongoing weight loss and dysphagia with vomiting. Patient reports that for several months she has been having dysphagia with food getting stuck in her mid esophagus and having to purge it out to relieve it. She complains symptoms mainly with solid food. As per GI note from 8/27 inch was also evaluated by psychiatrist for possible bulimia. EGD in 05/2016 showed reflux esophagitis with hiatal hernia and gastric stenosis with circumferential ulcer at the gastroduodenal anastomosis that was dilated. Patient reports that she continues to take Goody powder daily (for arthritis), smokes one pack per day and drinks about 6 packs of beer 1-2 times a week (per boyfriend at bedside she drinks more than that). She reports that she weighed 77 pounds 3 months back and now weighs 60 pounds. She denies any fevers, chills, dizziness, headache, lightheadedness, chest pain, palpitations, shortness of breath, abdominal pain, dysuria or diarrhea. Denies any other NSAID use. Denies hematemesis or melena.  Course in the ED Patient was hypotensive with blood pressure of 84/61 mmHg, heart rate occasionally bradycardic to  40s. Afebrile. She was given 500 mL normal saline bolus followed by maintenance normal saline (1 25 mL/h). Labs showed hemoglobin of 10.2 (about 2 g drop from 2017), sodium of 132, potassium of 2.6, chloride 89, as BUN of 23, albumin of 2.2 otherwise normal LFTs. Magnesium of 2. X-ray of the chest and abdomen was negative for acute findings.  She was given 40 mg by mouth potassium and 10 mg IV KCl. Hospitalist consulted for observation on telemetry. GI to be consulted by ED physician.       Review of systems:    In addition to the HPI above,  No Fever-chills, No Headache, No changes with Vision or hearing, Difficulty swallowing solids. No Chest pain, Cough or Shortness of Breath, No Abdominal pain, vomiting almost daily , Bowel movements are regular, No Blood in stool or Urine, No dysuria, No new skin rashes or bruises, No new joints pains-aches,  No new weakness, tingling, numbness in any extremity, Weight loss +++ No polyuria, polydypsia or polyphagia, No significant  Mental Stressors.     With Past History of the following :    Past Medical History:  Diagnosis Date  . COPD (chronic obstructive pulmonary disease) (HCC)   . Dysphagia   . Esophagitis   . Gastric outlet obstruction   . Gastric stenosis   . Hiatal hernia   . PUD (peptic ulcer disease)       Past Surgical History:  Procedure Laterality Date  . APPENDECTOMY    . BALLOON DILATION N/A 03/05/2016   Procedure: BALLOON DILATION;  Surgeon: Malissa Hippo, MD;  [facility];  Service: Endoscopy;  Laterality: N/A;  pyloric channel dialtaion  . CHOLECYSTECTOMY    . ESOPHAGEAL DILATION N/A 03/03/2016   Procedure: ESOPHAGEAL DILATION;  Surgeon: Malissa Hippo, MD;  [facility]: [facility];  Service: Endoscopy;  Laterality: N/A;  . ESOPHAGEAL DILATION N/A 05/07/2016   Procedure: ESOPHAGEAL DILATION;  Surgeon: Malissa Hippo, MD;  [facility];  Service: Endoscopy;  Laterality: N/A;  .  ESOPHAGOGASTRODUODENOSCOPY N/A 03/03/2016   Procedure: ESOPHAGOGASTRODUODENOSCOPY (EGD);  Surgeon: Malissa Hippo, MD;  [facility];  Service: Endoscopy;  Laterality: N/A;  2:00  . ESOPHAGOGASTRODUODENOSCOPY N/A 05/07/2016   Procedure: ESOPHAGOGASTRODUODENOSCOPY (EGD);  Surgeon: Malissa Hippo, MD;  [facility];  Service: Endoscopy;  Laterality: N/A;  855 - moved to 6/2 @ 10:15 - Ann notified pt  . ESOPHAGOGASTRODUODENOSCOPY (EGD) WITH PROPOFOL N/A 03/05/2016   Procedure: ESOPHAGOGASTRODUODENOSCOPY (EGD) WITH PROPOFOL Anastomotic stricture dilation ;  Surgeon: Malissa Hippo, MD;  [facility];  Service: Endoscopy;  Laterality: N/A;  to be done in OR under fluoro  . STOMACH SURGERY        Social History:     Social History   Tobacco Use  . Smoking status: Current Every Day Smoker    Packs/day: 1.00    Years: 20.00    Pack years: 20.00    Types: Cigarettes  Substance Use Topics  . Alcohol use: Yes    Alcohol/week: 7.2 oz    Types: 12 Cans of beer per week    Comment: beer two a day.      Lives - home  Mobility - and dependent     Family History :   No family history of heart disease, stroke or cancer   Home Medications:   Prior to Admission medications   Medication Sig Start Date End Date Taking? Authorizing Provider  albuterol (PROVENTIL HFA;VENTOLIN HFA) 108 (90 Base) MCG/ACT inhaler Inhale into the lungs every 6 (six) hours as needed for wheezing or shortness of breath.   Yes [provider]  cyclobenzaprine (FLEXERIL) 10 MG tablet Take 1 tablet by mouth daily. 12/22/17  Yes [provider]  DULERA 200-5 MCG/ACT AERO Inhale 1 puff into the lungs daily. 12/22/17  Yes [provider]  feeding supplement, ENSURE ENLIVE, (ENSURE ENLIVE) LIQD Take 237 mLs by mouth 2 (two) times daily between meals. 03/06/16  Yes Regalado, Belkys A, MD  Potassium 99 MG TABS Take 99 mg by mouth daily.    Yes [provider]    tetrahydrozoline 0.05 % ophthalmic solution Place 1 drop into both eyes daily as needed (dry eyes).   Yes [provider]  traZODone (DESYREL) 100 MG tablet Take 100 mg by mouth at bedtime.   Yes [provider]  Hydrocodone-Acetaminophen 5-300 MG TABS Take 1 tablet by mouth every 6 (six) hours as needed. Patient not taking: Reported on 07/06/2016  03/06/16   Regalado, Belkys A, MD  pantoprazole (PROTONIX) 40 MG tablet Take 1 tablet (40 mg total) by mouth 2 (two) times daily before a meal. Patient not taking: Reported on 12/26/2017 03/06/16   Alba Cory, MD     Allergies:     Allergies  Allergen Reactions  . Penicillins     Tongue swells, itching Has patient had a PCN reaction causing immediate rash, facial/tongue/throat swelling, SOB or lightheadedness with hypotension: Yes, had a rash and tongue swelled up. No SOB and lightheadedness. Has patient had a PCN reaction causing severe rash involving mucus membranes or skin necrosis: No Has patient had a PCN reaction that required hospitalization: No Has patient had a PCN reaction occurring within the last 10 years: No If all of the above answers are "NO", then may procee     Physical Exam:   Vitals  Blood pressure (!) 88/63, pulse (!) 44, temperature 98.1 F (36.7 C), temperature source Oral, resp. rate 17, height 5\' 1"  (1.549 m), weight 27.2 kg (60 lb), SpO2 98 %.   General: Middle aged cachectic female in no acute distress HEENT: Pupils reactive bilaterally, EOMI, pallor present, temporal wasting, no icterus, dry oral mucosa, supple neck, no cervical lymphadenopathy Chest: Clear to auscultation bilaterally  CVS: Normal S1 and S2, no murmurs or gallop GI: Soft, nondistended, nontender, bowel sounds present Musculoskeletal: Warm, thin extremities, no edema, normal skin CNS: Alert and oriented, nonfocal    Data Review:    CBC Recent Labs  Lab 12/26/17 1015  WBC 5.0  HGB 10.2*  HCT 32.4*  PLT 268  MCV  92.8  MCH 29.2  MCHC 31.5  RDW 17.6*  LYMPHSABS 1.5  MONOABS 0.5  EOSABS 0.1  BASOSABS 0.0   ------------------------------------------------------------------------------------------------------------------  Chemistries  Recent Labs  Lab 12/26/17 1015 12/26/17 1103  NA 132*  --   K 2.6*  --   CL 89*  --   CO2 33*  --   GLUCOSE 76  --   BUN 23*  --   CREATININE 0.71  --   CALCIUM 7.8*  --   MG  --  2.0  AST 27  --   ALT 18  --   ALKPHOS 64  --   BILITOT 0.3  --    ------------------------------------------------------------------------------------------------------------------ estimated creatinine clearance is 31.3 mL/min (by C-G formula based on SCr of 0.71 mg/dL). ------------------------------------------------------------------------------------------------------------------ No results for input(s): TSH, T4TOTAL, T3FREE, THYROIDAB in the last 72 hours.  Invalid input(s): FREET3  Coagulation profile No results for input(s): INR, PROTIME in the last 168 hours. ------------------------------------------------------------------------------------------------------------------- No results for input(s): DDIMER in the last 72 hours. -------------------------------------------------------------------------------------------------------------------  Cardiac Enzymes No results for input(s): CKMB, TROPONINI, MYOGLOBIN in the last 168 hours.  Invalid input(s): CK ------------------------------------------------------------------------------------------------------------------ No results found for: BNP   ---------------------------------------------------------------------------------------------------------------  Urinalysis    Component Value Date/Time   COLORURINE YELLOW 12/26/2017 1006   APPEARANCEUR CLEAR 12/26/2017 1006   LABSPEC 1.026 12/26/2017 1006   PHURINE 5.0 12/26/2017 1006   GLUCOSEU NEGATIVE 12/26/2017 1006   HGBUR NEGATIVE 12/26/2017 1006    BILIRUBINUR NEGATIVE 12/26/2017 1006   KETONESUR 5 (A) 12/26/2017 1006   PROTEINUR NEGATIVE 12/26/2017 1006   NITRITE NEGATIVE 12/26/2017 1006   LEUKOCYTESUR NEGATIVE 12/26/2017 1006    ----------------------------------------------------------------------------------------------------------------   Imaging Results:    Dg Abd Acute W/chest  Result Date: 12/26/2017 CLINICAL DATA:  Nausea and vomiting for 4 years. Weight loss since July. EXAM: DG ABDOMEN ACUTE W/ 1V CHEST COMPARISON:  None.  FINDINGS: Normal heart size and mediastinal contours. No acute infiltrate or edema. Small calcified pulmonary granulomas. No effusion or pneumothorax. Nonobstructive bowel gas pattern. Bowel sutures over the epigastrium and pelvis. Upper abdominal surgical clips. No concerning mass effect or gas collection. No evidence of pneumoperitoneum. Osteopenic appearance. Thoracolumbar levoscoliosis. Remote bilateral rib fractures. IMPRESSION: No acute finding in the chest or abdomen. Electronically Signed   By: Marnee Spring M.D.   On: 12/26/2017 11:09    My personal review of EKG: Normal sinus rhythm with RBBB with prolonged QTC of 562   Assessment & Plan:    Principal Problem:   Hypokalemia Suspected due to poor by mouth intake and GI loss. Replacing potassium with by mouth and IV. Monitor on telemetry. Magnesium of 2. IV magnesium sulfate ordered in the ED. Recheck labs in a.m.  Active Problems:   Dysphagia with? Gastric outlet obstruction Prior history of esophagitis with stenosed Billroth I gastroduodenostomy with dilatation (last EGD in 05/2016). Patient reports ongoing dysphagia with lump sensation in the esophagus with solid food and purging it out almost every other day. This is likely contributed to her severe malnutrition and cachexia. Reports she has been tolerating liquids. IV PPI twice a day. GI consulted by ED physician. Ordered clear liquids for now.     Active problems   Protein-calorie  malnutrition, severe   Cachexia (HCC) Patient weighs only 60 pounds and reports almost 17 pound weight loss in the past 2 months. Likely secondary to poor by mouth intake and ongoing vomiting. Nutrition consult.  Severe hypokalemia Replenished in ED. Monitor on telemetry.   Hypotension Secondary to dehydration. Monitor with aggressive hydration.    Anemia Check iron panel, B12 level and FOBT. Reports daily use of Goody powder for arthritic pain and ongoing smoking. Counseled on stopping use of NSAIDs including Goody powder and tobacco use.  Alcohol abuse Reports drinking 6 packs of beer about twice a week (boyfriend says she drinks more than that). Monitor on CIWA.    Prolonged QT interval Monitor on telemetry. Aggressively replenish potassium. Monitor magnesium. Avoid Q-tip lung agents (hold trazodone and dulera)  Hyponatremia/hypochloremia Mild. Monitor with fluids.    COPD (chronic obstructive pulmonary disease) (HCC) No acute symptoms. Albuterol inhaler as needed. Hold dulera due to prolonged QTC. Nicotine patch. Counseled strongly on smoking cessation.   DVT Prophylaxis : Subcutaneous Lovenox  AM Labs Ordered, also please review Full Orders  Family Communication: Admission, patients condition and plan of care including tests being ordered have been discussed with the patient and her boyfriend at bedside.  Code Status full code  Likely DC to home  Condition : Fair  Consults called: GI (Dr. Ezzard Standing by ED physician)   Admission status: Observation  Time spent in minutes : 55   Venetta Knee M.D on 12/26/2017 at 1:54 PM  Between 7am to 7pm - Pager - [phone]. After 7pm go to [email or web address] - password [facility]  [facility] - Office  [phone]

## 2017-12-26 NOTE — ED Notes (Signed)
Pt placed on monitor.  

## 2017-12-26 NOTE — Progress Notes (Signed)
Dr. Karilyn Cota called to see how pt was tolerating CL diet; Rn adv MD that pt is tolerating CL diet fine. No N&V, pt ate popsicle with no issues.  Dr. Karilyn Cota gave verbal order to start pt on full liquid diet starting in the am. Order placed. Will continue to monitor pt

## 2017-12-26 NOTE — Progress Notes (Signed)
Dr. Craige Cotta paged and made aware that pt is requesting her Flexeril. Pt takes 10mg  per qday as her home medications and it was not ordered for this admission. This Rn did not see in any MD notes why it was held. Waiting for orders/call back

## 2017-12-27 DIAGNOSIS — K9189 Other postprocedural complications and disorders of digestive system: Secondary | ICD-10-CM | POA: Diagnosis present

## 2017-12-27 DIAGNOSIS — Z681 Body mass index (BMI) 19 or less, adult: Secondary | ICD-10-CM | POA: Diagnosis not present

## 2017-12-27 DIAGNOSIS — K210 Gastro-esophageal reflux disease with esophagitis: Secondary | ICD-10-CM | POA: Diagnosis present

## 2017-12-27 DIAGNOSIS — K311 Adult hypertrophic pyloric stenosis: Secondary | ICD-10-CM | POA: Diagnosis present

## 2017-12-27 DIAGNOSIS — T39395A Adverse effect of other nonsteroidal anti-inflammatory drugs [NSAID], initial encounter: Secondary | ICD-10-CM | POA: Diagnosis present

## 2017-12-27 DIAGNOSIS — J449 Chronic obstructive pulmonary disease, unspecified: Secondary | ICD-10-CM | POA: Diagnosis present

## 2017-12-27 DIAGNOSIS — D649 Anemia, unspecified: Secondary | ICD-10-CM | POA: Diagnosis present

## 2017-12-27 DIAGNOSIS — F1010 Alcohol abuse, uncomplicated: Secondary | ICD-10-CM | POA: Diagnosis present

## 2017-12-27 DIAGNOSIS — F17210 Nicotine dependence, cigarettes, uncomplicated: Secondary | ICD-10-CM | POA: Diagnosis present

## 2017-12-27 DIAGNOSIS — G8929 Other chronic pain: Secondary | ICD-10-CM | POA: Diagnosis present

## 2017-12-27 DIAGNOSIS — R001 Bradycardia, unspecified: Secondary | ICD-10-CM | POA: Diagnosis present

## 2017-12-27 DIAGNOSIS — M545 Low back pain: Secondary | ICD-10-CM | POA: Diagnosis present

## 2017-12-27 DIAGNOSIS — R64 Cachexia: Secondary | ICD-10-CM | POA: Diagnosis present

## 2017-12-27 DIAGNOSIS — M1990 Unspecified osteoarthritis, unspecified site: Secondary | ICD-10-CM | POA: Diagnosis present

## 2017-12-27 DIAGNOSIS — E876 Hypokalemia: Secondary | ICD-10-CM | POA: Diagnosis present

## 2017-12-27 DIAGNOSIS — K259 Gastric ulcer, unspecified as acute or chronic, without hemorrhage or perforation: Secondary | ICD-10-CM | POA: Diagnosis present

## 2017-12-27 DIAGNOSIS — E860 Dehydration: Secondary | ICD-10-CM | POA: Diagnosis present

## 2017-12-27 DIAGNOSIS — E43 Unspecified severe protein-calorie malnutrition: Secondary | ICD-10-CM | POA: Diagnosis present

## 2017-12-27 DIAGNOSIS — E871 Hypo-osmolality and hyponatremia: Secondary | ICD-10-CM | POA: Diagnosis present

## 2017-12-27 DIAGNOSIS — R1310 Dysphagia, unspecified: Secondary | ICD-10-CM | POA: Diagnosis present

## 2017-12-27 DIAGNOSIS — I959 Hypotension, unspecified: Secondary | ICD-10-CM | POA: Diagnosis present

## 2017-12-27 DIAGNOSIS — L89151 Pressure ulcer of sacral region, stage 1: Secondary | ICD-10-CM | POA: Diagnosis present

## 2017-12-27 DIAGNOSIS — E878 Other disorders of electrolyte and fluid balance, not elsewhere classified: Secondary | ICD-10-CM | POA: Diagnosis present

## 2017-12-27 DIAGNOSIS — I4581 Long QT syndrome: Secondary | ICD-10-CM | POA: Diagnosis present

## 2017-12-27 LAB — COMPREHENSIVE METABOLIC PANEL
ALT: 20 U/L (ref 14–54)
ANION GAP: 6 (ref 5–15)
AST: 39 U/L (ref 15–41)
Albumin: 1.8 g/dL — ABNORMAL LOW (ref 3.5–5.0)
Alkaline Phosphatase: 54 U/L (ref 38–126)
BUN: 8 mg/dL (ref 6–20)
CHLORIDE: 104 mmol/L (ref 101–111)
CO2: 23 mmol/L (ref 22–32)
Calcium: 7.4 mg/dL — ABNORMAL LOW (ref 8.9–10.3)
Creatinine, Ser: 0.4 mg/dL — ABNORMAL LOW (ref 0.44–1.00)
GFR calc non Af Amer: >60 mL/min (ref >60)
Glucose, Bld: 61 mg/dL — ABNORMAL LOW (ref 65–99)
POTASSIUM: 5.5 mmol/L — AB (ref 3.5–5.1)
SODIUM: 133 mmol/L — AB (ref 135–145)
Total Bilirubin: 0.3 mg/dL (ref 0.3–1.2)
Total Protein: 4.4 g/dL — ABNORMAL LOW (ref 6.5–8.1)

## 2017-12-27 LAB — CBC
HCT: 32.6 % — ABNORMAL LOW (ref 36.0–46.0)
Hemoglobin: 10.2 g/dL — ABNORMAL LOW (ref 12.0–15.0)
MCH: 29.9 pg (ref 26.0–34.0)
MCHC: 31.3 g/dL (ref 30.0–36.0)
MCV: 95.6 fL (ref 78.0–100.0)
PLATELETS: 292 10*3/uL (ref 150–400)
RBC: 3.41 MIL/uL — AB (ref 3.87–5.11)
RDW: 17.9 % — ABNORMAL HIGH (ref 11.5–15.5)
WBC: 4.5 10*3/uL (ref 4.0–10.5)

## 2017-12-27 LAB — BASIC METABOLIC PANEL
Anion gap: 3 — ABNORMAL LOW (ref 5–15)
BUN: 9 mg/dL (ref 6–20)
CALCIUM: 7.6 mg/dL — AB (ref 8.9–10.3)
CO2: 27 mmol/L (ref 22–32)
CREATININE: 0.38 mg/dL — AB (ref 0.44–1.00)
Chloride: 105 mmol/L (ref 101–111)
GFR calc non Af Amer: >60 mL/min (ref >60)
GLUCOSE: 74 mg/dL (ref 65–99)
Potassium: 6.4 mmol/L (ref 3.5–5.1)
Sodium: 135 mmol/L (ref 135–145)

## 2017-12-27 LAB — HIV ANTIBODY (ROUTINE TESTING W REFLEX): HIV Screen 4th Generation wRfx: NONREACTIVE

## 2017-12-27 MED ORDER — SODIUM CHLORIDE 0.9 % IV SOLN
INTRAVENOUS | Status: DC
  Administered 2017-12-27 – 2017-12-30 (×7): via INTRAVENOUS

## 2017-12-27 MED ORDER — SODIUM POLYSTYRENE SULFONATE 15 GM/60ML PO SUSP
30.0000 g | Freq: Once | ORAL | Status: AC
  Administered 2017-12-27: 30 g via ORAL
  Filled 2017-12-27: qty 120

## 2017-12-27 MED ORDER — SODIUM CHLORIDE 0.9 % IV BOLUS (SEPSIS)
500.0000 mL | Freq: Once | INTRAVENOUS | Status: AC
  Administered 2017-12-27: 1000 mL via INTRAVENOUS

## 2017-12-27 NOTE — Progress Notes (Signed)
  Subjective:  Patient complains of lower back pain.  She denies nausea vomiting heartburn or abdominal pain.  She did not have any difficulty with clear liquids.  She is hungry this morning.   Objective: Blood pressure 90/67, pulse (!) 57, temperature 97.6 F (36.4 C), temperature source Oral, resp. rate 16, height 5\' 1"  (1.549 m), weight 60 lb (27.2 kg), SpO2 98 %. Patient is alert and in no acute distress. Abdomen is scaphoid soft and nontender without organomegaly or masses.  Labs/studies Results:  Recent Labs    2018-01-06 1015 12/27/17 0700  WBC 5.0 4.5  HGB 10.2* 10.2*  HCT 32.4* 32.6*  PLT 268 292    BMET  Recent Labs    January 06, 2018 1015 12/27/17 0700  NA 132* 135  K 2.6* 6.4*  CL 89* 105  CO2 33* 27  GLUCOSE 76 74  BUN 23* 9  CREATININE 0.71 0.38*  CALCIUM 7.8* 7.6*    LFT  Recent Labs    2018/01/06 1015  PROT 5.1*  ALBUMIN 2.2*  AST 27  ALT 18  ALKPHOS 64  BILITOT 0.3    Serum iron 44, TIBC 202 and saturation 22% B12 level 541.  Assessment:  #1.  Recurrent nausea and vomiting secondary to chronic peptic ulcer disease and gastro duodenal anastomotic stricture.  Patient has continued to use OTC NSAIDs.  She is tolerating clear liquids.  #2.  Anemia.  No evidence of GI bleed.  However she could be bleeding intermittently from her upper GI tract.  No evidence of B12 or iron deficiency.  #3.  Weight loss.  Her BMI is 11.34.  She has had low weight for several years.  Her weight was up to 77 pounds in August 2018.  Weight loss is secondary to chronic nausea and vomiting.  #4.  Hypokalemia.  And potassium is now high.  Management per Dr. September 2018 Tat.  #5.  Low back pain.  Management per Dr. Onalee Hua Tat.   Recommendations:  Advance diet to full liquids. Esophagogastroduodenoscopy with stricture dilation in a.m.

## 2017-12-27 NOTE — Plan of Care (Signed)
  Progressing Nutrition: Adequate nutrition will be maintained 12/27/2017 0102 - Progressing by Wynne Dust, RN Safety: Ability to remain free from injury will improve 12/27/2017 0909 - Progressing by Wynne Dust, RN Skin Integrity: Risk for impaired skin integrity will decrease 12/27/2017 0909 - Progressing by Wynne Dust, RN

## 2017-12-27 NOTE — Progress Notes (Signed)
PROGRESS NOTE    Patient: Kristine Roberts     PCP: [facility], [facility]                    DOB: 05/31/1956            DOA: 12/26/2017 QDI:[id number]             DOS: 12/27/2017, 3:06 PM   Date of Service: the patient was seen and examined on 12/27/2017 Subjective:  Patient was seen and examined this morning, stable.  Was tolerating some clear liquid diet. She was seeking some narcotics and muscle relaxants overnight for acute on chronic back pain. It was explained to the patient that secondary to her current medical condition is not recommended. She has expressed understanding.  ----------------------------------------------------------------------------------------------------------------------  Brief Narrative:   Power Contreraz  is a 62 y.o. female, with history of history of Billroth type I in? 2001 for peptic ulcer disease, ongoing tobacco and alcohol use who follows with GI Dr Karilyn Cota (last EGD in 05/2016) for dysphagia and significant weight loss was sent to the ED by physician at the health department for ongoing weight loss and dysphagia with vomiting. Patient reports that for several months she has been having dysphagia with food getting stuck in her mid esophagus and having to purge it out to relieve it. She complains symptoms mainly with solid food. As per GI note from 8/27 inch was also evaluated by psychiatrist for possible bulimia. EGD in 05/2016 showed reflux esophagitis with hiatal hernia and gastric stenosis with circumferential ulcer at the gastroduodenal anastomosis that was dilated. Reporting of unintentional weight loss.  Reporting patient weight 77 pounds 3 months ago now she is approximately 60 pounds.    Principal Problem:   Hypokalemia Active Problems:   Dysphagia   Gastric outlet obstruction   COPD (chronic obstructive pulmonary disease) (HCC)   Protein-calorie malnutrition, severe   Cachexia (HCC)   Hypotension   Tobacco abuse   ETOH abuse   Anemia   Prolonged QT interval   Pressure injury of skin   Assessment & Plan:   Hypokalemia Suspected due to poor by mouth intake and GI loss. Replacing potassium with by mouth and IV. Monitor on telemetry. Magnesium of 2. IV magnesium sulfate ordered in the ED. Recheck labs in a.m.  Active Problems:   Dysphagia with? Gastric outlet obstruction Prior history of esophagitis with stenosed Billroth I gastroduodenostomy with dilatation (last EGD in 05/2016). Patient reports ongoing dysphagia with lump sensation in the esophagus with solid food and purging it out almost every other day.  Currently tolerating a liquid, GI has recommended to advance diet. Appreciate GI further evaluation recommendation    Protein-calorie malnutrition, severe/ Cachexia (HCC)/BMI 11.3 Patient weighs only 60 pounds and reports almost 17 pound weight loss in the past 2 months. Likely secondary to poor by mouth intake and ongoing vomiting.  Nutrition consult. Continuing dietary supplements  Severe hypokalemia Replenished in ED., relatively now hyperkalemic, will treat with p.o. Kayexalate, rechecking labs  Hypotension Secondary to dehydration. Monitor with aggressive hydration.  Anemia -monitoring H&H closely,, no active GI bleed Iron studies reviewed, total iron 44, TIBC 202, B12 normal at 541 Monitoring H&H Pending FOBT.  Reports daily use of Goody powder for arthritic pain and ongoing smoking.  Counseled on stopping use of NSAIDs including Goody powder and tobacco use.  Alcohol abuse Reports drinking 6 packs of beer about twice a week (boyfriend says she drinks more than that). Monitor on CIWA.  Prolonged QT interval Monitor on telemetry. Aggressively replenish potassium. Monitor magnesium. Avoid Q-tip lung agents (hold trazodone and dulera)  Hyponatremia/hypochloremia Mild. Monitor with fluids.    COPD (chronic obstructive pulmonary disease) (HCC) No acute symptoms. Albuterol inhaler as needed. Hold  dulera due to prolonged QTC. Nicotine patch. Counseled strongly on smoking cessation.   DVT Prophylaxis : Subcutaneous Lovenox  AM Labs Ordered, also please review Full Orders  Family Communication: Both are present at bedside  Code Status full code  Likely DC to home  Condition : Fair  Consults called: GI (Dr. Karilyn Cota)   DVT prophylaxis:   Heparin Sq    SCDs/compression stockings         Code Status:         Full code  Family Communication: Currently I encouraged her oral hydration is fine you The above findings and plan of care has been discussed with patient and her boyfriend at bedside, they expressed understanding and agreement of above.   Disposition Plan:  1-2 days,          Home  Procedures:  No admission procedures for hospital encounter.   Antimicrobials:  Anti-infectives (From admission, onward)   None      Objective: Vitals:   12/27/17 0019 12/27/17 0300 12/27/17 1157 12/27/17 1420  BP: (!) 87/56 90/67 (!) 91/57 (!) 94/56  Pulse: 62 (!) 57 68 62  Resp: 16  17 18   Temp:  97.6 F (36.4 C) 98.6 F (37 C) 98.6 F (37 C)  TempSrc:  Oral Oral Oral  SpO2: 98%  100% 100%  Weight:      Height:        Intake/Output Summary (Last 24 hours) at 12/27/2017 1506 Last data filed at 12/27/2017 0700 Gross per 24 hour  Intake 2680 ml  Output 400 ml  Net 2280 ml   Filed Weights   12/26/17 0909  Weight: 27.2 kg (60 lb)    Examination:  General exam: Cachectic, appears calm and comfortable  Respiratory system: Clear to auscultation. Respiratory effort normal. Cardiovascular system: S1 & S2 heard, RRR. No JVD, murmurs, rubs, gallops or clicks. No pedal edema. Gastrointestinal system: Abdomen is nondistended, soft and nontender. No organomegaly or masses felt. Normal bowel sounds heard. Central nervous system: Alert and oriented. No focal neurological deficits. Extremities: Symmetric 5 x 5 power.,  Severe generalized muscle wasting noted Skin: No  rashes, lesions or ulcers Psychiatry: Judgement and insight appear normal. Mood & affect appropriate.     Data Reviewed: I have personally reviewed following labs and imaging studies  CBC: Recent Labs  Lab 12/26/17 1015 12/27/17 0700  WBC 5.0 4.5  NEUTROABS 2.9  --   HGB 10.2* 10.2*  HCT 32.4* 32.6*  MCV 92.8 95.6  PLT 268 292   Basic Metabolic Panel: Recent Labs  Lab 12/26/17 1015 12/26/17 1103 12/27/17 0700 12/27/17 0938  NA 132*  --  135 133*  K 2.6*  --  6.4* 5.5*  CL 89*  --  105 104  CO2 33*  --  27 23  GLUCOSE 76  --  74 61*  BUN 23*  --  9 8  CREATININE 0.71  --  0.38* 0.40*  CALCIUM 7.8*  --  7.6* 7.4*  MG  --  2.0  --   --    GFR: Estimated Creatinine Clearance: 31.3 mL/min (A) (by C-G formula based on SCr of 0.4 mg/dL (L)). Liver Function Tests: Recent Labs  Lab 12/26/17 1015 12/27/17 0938  AST  27 39  ALT 18 20  ALKPHOS 64 54  BILITOT 0.3 0.3  PROT 5.1* 4.4*  ALBUMIN 2.2* 1.8*   Recent Labs  Lab 12/26/17 1015  LIPASE 18   No results for input(s): AMMONIA in the last 168 hours. Coagulation Profile: No results for input(s): INR, PROTIME in the last 168 hours. Cardiac Enzymes: No results for input(s): CKTOTAL, CKMB, CKMBINDEX, TROPONINI in the last 168 hours. BNP (last 3 results) No results for input(s): PROBNP in the last 8760 hours. HbA1C: No results for input(s): HGBA1C in the last 72 hours. CBG: No results for input(s): GLUCAP in the last 168 hours. Lipid Profile: No results for input(s): CHOL, HDL, LDLCALC, TRIG, CHOLHDL, LDLDIRECT in the last 72 hours. Thyroid Function Tests: No results for input(s): TSH, T4TOTAL, FREET4, T3FREE, THYROIDAB in the last 72 hours. Anemia Panel: Recent Labs    12/26/17 1400  VITAMINB12 541  TIBC 202*  IRON 44   Sepsis Labs: No results for input(s): PROCALCITON, LATICACIDVEN in the last 168 hours.  No results found for this or any previous visit (from the past 240 hour(s)).     Radiology  Studies: Dg Abd Acute W/chest  Result Date: 12/26/2017 CLINICAL DATA:  Nausea and vomiting for 4 years. Weight loss since July. EXAM: DG ABDOMEN ACUTE W/ 1V CHEST COMPARISON:  None. FINDINGS: Normal heart size and mediastinal contours. No acute infiltrate or edema. Small calcified pulmonary granulomas. No effusion or pneumothorax. Nonobstructive bowel gas pattern. Bowel sutures over the epigastrium and pelvis. Upper abdominal surgical clips. No concerning mass effect or gas collection. No evidence of pneumoperitoneum. Osteopenic appearance. Thoracolumbar levoscoliosis. Remote bilateral rib fractures. IMPRESSION: No acute finding in the chest or abdomen. Electronically Signed   By: Marnee Spring M.D.   On: 12/26/2017 11:09    Scheduled Meds: . enoxaparin (LOVENOX) injection  30 mg Subcutaneous Q24H  . feeding supplement (ENSURE ENLIVE)  237 mL Oral BID BM  . folic acid  1 mg Oral Daily  . multivitamin with minerals  1 tablet Oral Daily  . nicotine  21 mg Transdermal Daily  . pantoprazole (PROTONIX) IV  40 mg Intravenous Q12H  . thiamine  100 mg Oral Daily   Or  . thiamine  100 mg Intravenous Daily   Continuous Infusions: . sodium chloride 125 mL/hr at 12/27/17 1010     LOS: 0 days    Time spent: >25 minutes   Kendell Bane, MD [facility] Pager [phone]  If 7PM-7AM, please contact night-coverage [email or web address] Password [facility] 12/27/2017, 3:06 PM  This patient means in this patient with

## 2017-12-27 NOTE — Progress Notes (Signed)
Initial Nutrition Assessment  DOCUMENTATION CODES:  Underweight, Severe malnutrition in context of chronic illness  INTERVENTION:  When able, would appreciate re-weight of patient, either bed or standing.   D/C Ensure due to taste fatigue and begin:  Mighty Shake II BID, each supplement provides 480-500 kcals and 20-23 grams of protein  Magic cup BID with meals, each supplement provides 290 kcal and 9 grams of protein  NUTRITION DIAGNOSIS:  Severe Malnutrition related to dysphagia, nausea, altered GI function(Reccurent anastomatic stricure) as evidenced by severe muscle/fat depleition  GOAL:  Patient will meet greater than or equal to 90% of their needs  MONITOR:  PO intake, Supplement acceptance, Diet advancement, Labs, Weight trends, I & O's  REASON FOR ASSESSMENT:  Malnutrition Screening Tool    ASSESSMENT:  62 y/o female PMHx PUD s/p Billroth 1, COPD, tobacco/etoh abuse, chronic dysphagia, recurrent anastomotic stricture. Sent to ED by healthy department due to ongoing wt loss, dysphagia, vomiting. Admitted for further GI workup.   Patient is quite amiable. She says that despite, her reported weight loss and severe vomiting, she has had a "very good" appetite. She says she has "not had any problems with that". She tries to eat small amount throughout the day, but still has vomiting episodes. She says textured do not matter and she has just as much vomiting w/ liquids as solids. She drinks Ensure at home. Denies any c/d.   Weight wise. She was measured at 77 lbs 13 oz at an outpatient GI appointment ~6 months ago. Her current weight is 60 lbs, though this appears as it may have been reported. Subjectively, she appears bigger than her BMI would indicate. Was unable to get bedweight today due to bed position.   She has wounds and would obviously benefit from supplementation. She is reports some taste fatigue with Ensure. She was very interested in trying some others. Will order  [employer] shake and magic cup BID to offer some variety. Otherwise, the patient says she has no issues with her meals.   At this time, patient on full liquid diet. Scheduled for EGD w/ dilation tomorrow. She actually states that these dilations have "not helped" in the past.   Physical Exam: Severe thoracic fat wasting. Moderate underarm fat wasting.   Labs: K:5.5, Albumin:1.8  Meds: Folate, PPI, MVI w/ Min, Ensure Enlive BID  Recent Labs  Lab 12/26/17 1015 12/26/17 1103 12/27/17 0700 12/27/17 0938  NA 132*  --  135 133*  K 2.6*  --  6.4* 5.5*  CL 89*  --  105 104  CO2 33*  --  27 23  BUN 23*  --  9 8  CREATININE 0.71  --  0.38* 0.40*  CALCIUM 7.8*  --  7.6* 7.4*  MG  --  2.0  --   --   GLUCOSE 76  --  74 61*   NUTRITION - FOCUSED PHYSICAL EXAM:   Most Recent Value  Orbital Region  No depletion  Upper Arm Region  Moderate depletion  Thoracic and Lumbar Region  Severe depletion  Temple Region  Mild depletion  Clavicle Bone Region  Moderate depletion  Clavicle and Acromion Bone Region  Moderate depletion  Scapular Bone Region  Unable to assess  Dorsal Hand  Moderate depletion  Patellar Region  Severe depletion  Anterior Thigh Region  Severe depletion  Posterior Calf Region  Severe depletion  Edema (RD Assessment)  None      Diet Order:  Diet full liquid Room service appropriate? Yes;  Fluid consistency: Thin  EDUCATION NEEDS:  No education needs have been identified at this time  Skin:PU stage 1 to R sacrum, PU stage 2 to anus   Last BM:  1/21  Height:  Ht Readings from Last 1 Encounters:  12/26/17 5\' 1"  (1.549 m)   Weight:  Wt Readings from Last 1 Encounters:  12/26/17 60 lb (27.2 kg)   Wt Readings from Last 10 Encounters:  12/26/17 60 lb (27.2 kg)  07/06/16 77 lb 12.8 oz (35.3 kg)  05/07/16 63 lb (28.6 kg)  03/03/16 63 lb (28.6 kg)  02/03/16 67 lb 11.2 oz (30.7 kg)   Ideal Body Weight:  47.73 kg  BMI:  Body mass index is 11.34 kg/m.  Estimated  Nutritional Needs:  Kcal:  >1400 kcals (40 kcal/kg bw) Protein:  >70 g (2g/kg bw) Fluid:  >1.1 L fluid  02/05/16 RD, LDN, CNSC Clinical Nutrition Pager: Christophe Louis 12/27/2017 12:51 PM

## 2017-12-28 ENCOUNTER — Encounter (HOSPITAL_COMMUNITY): Payer: Self-pay | Admitting: *Deleted

## 2017-12-28 ENCOUNTER — Encounter (HOSPITAL_COMMUNITY)
Admission: EM | Disposition: A | Discharge status: Home / Self Care | Payer: Self-pay | Source: Home / Self Care | Attending: Family Medicine

## 2017-12-28 DIAGNOSIS — K228 Other specified diseases of esophagus: Secondary | ICD-10-CM

## 2017-12-28 DIAGNOSIS — K259 Gastric ulcer, unspecified as acute or chronic, without hemorrhage or perforation: Secondary | ICD-10-CM

## 2017-12-28 DIAGNOSIS — Z980 Intestinal bypass and anastomosis status: Secondary | ICD-10-CM

## 2017-12-28 DIAGNOSIS — K210 Gastro-esophageal reflux disease with esophagitis: Secondary | ICD-10-CM

## 2017-12-28 DIAGNOSIS — K449 Diaphragmatic hernia without obstruction or gangrene: Secondary | ICD-10-CM

## 2017-12-28 DIAGNOSIS — K3189 Other diseases of stomach and duodenum: Secondary | ICD-10-CM

## 2017-12-28 HISTORY — PX: ESOPHAGOGASTRODUODENOSCOPY: SHX5428

## 2017-12-28 LAB — BASIC METABOLIC PANEL
Anion gap: 6 (ref 5–15)
BUN: 5 mg/dL — ABNORMAL LOW (ref 6–20)
CHLORIDE: 109 mmol/L (ref 101–111)
CO2: 22 mmol/L (ref 22–32)
CREATININE: 0.3 mg/dL — AB (ref 0.44–1.00)
Calcium: 7.2 mg/dL — ABNORMAL LOW (ref 8.9–10.3)
GFR calc Af Amer: >60 mL/min (ref >60)
GFR calc non Af Amer: >60 mL/min (ref >60)
Glucose, Bld: 69 mg/dL (ref 65–99)
POTASSIUM: 3.6 mmol/L (ref 3.5–5.1)
Sodium: 137 mmol/L (ref 135–145)

## 2017-12-28 SURGERY — EGD (ESOPHAGOGASTRODUODENOSCOPY)
Anesthesia: Moderate Sedation

## 2017-12-28 MED ORDER — STERILE WATER FOR IRRIGATION IR SOLN
Status: DC | PRN
  Administered 2017-12-28: 2.5 mL

## 2017-12-28 MED ORDER — MOMETASONE FURO-FORMOTEROL FUM 200-5 MCG/ACT IN AERO
1.0000 | INHALATION_SPRAY | Freq: Every day | RESPIRATORY_TRACT | Status: DC
  Filled 2017-12-28: qty 8.8

## 2017-12-28 MED ORDER — MIDAZOLAM HCL 5 MG/5ML IJ SOLN
INTRAMUSCULAR | Status: AC
  Filled 2017-12-28: qty 10

## 2017-12-28 MED ORDER — SODIUM CHLORIDE 0.9 % IV SOLN: INTRAVENOUS | Status: DC

## 2017-12-28 MED ORDER — MEPERIDINE HCL 50 MG/ML IJ SOLN
INTRAMUSCULAR | Status: AC
  Filled 2017-12-28: qty 1

## 2017-12-28 MED ORDER — MEPERIDINE HCL 50 MG/ML IJ SOLN
INTRAMUSCULAR | Status: DC | PRN
  Administered 2017-12-28 (×2): 25 mg via INTRAVENOUS

## 2017-12-28 MED ORDER — MIDAZOLAM HCL 5 MG/5ML IJ SOLN
INTRAMUSCULAR | Status: DC | PRN
  Administered 2017-12-28: 1 mg via INTRAVENOUS
  Administered 2017-12-28 (×2): 2 mg via INTRAVENOUS

## 2017-12-28 MED ORDER — LIDOCAINE VISCOUS 2 % MT SOLN
OROMUCOSAL | Status: AC
  Filled 2017-12-28: qty 15

## 2017-12-28 MED ORDER — LIDOCAINE VISCOUS 2 % MT SOLN
OROMUCOSAL | Status: DC | PRN
  Administered 2017-12-28: 1 via OROMUCOSAL

## 2017-12-28 NOTE — Progress Notes (Addendum)
PROGRESS NOTE    Patient: Kristine Roberts     PCP: [facility], [facility]                    DOB: 25-Jan-1956            DOA: 12/26/2017 PYP:[id number]             DOS: 12/28/2017, 10:54 AM   Date of Service: the patient was seen and examined on 12/28/2017 Subjective:   Patient was seen and examined this morning, awake alert oriented no acute distress, no issues overnight.  Not complaining of shortness of breath or chest pain.  Reporting complaining of chronic back pain  We have discussed in detail regarding the use of narcotics, muscle relaxants, using NSAIDs. The risk and benefit of medications has been discussed.,  So her habits regarding use of Goody powders, alcohol tobacco.  She seems cooperative, expressed understanding and agreement with continue current plan. Anticipate EGD today  ----------------------------------------------------------------------------------------------------------------------  Brief Narrative:   Kristine Roberts  is a 62 y.o. female, with history of history of Billroth type I in? 2001 for peptic ulcer disease, ongoing tobacco and alcohol use who follows with GI Dr Karilyn Cota (last EGD in 05/2016) for dysphagia and significant weight loss was sent to the ED by physician at the health department for ongoing weight loss and dysphagia with vomiting. Patient reports that for several months she has been having dysphagia with food getting stuck in her mid esophagus and having to purge it out to relieve it. She complains symptoms mainly with solid food. As per GI note from 8/27 inch was also evaluated by psychiatrist for possible bulimia. EGD in 05/2016 showed reflux esophagitis with hiatal hernia and gastric stenosis with circumferential ulcer at the gastroduodenal anastomosis that was dilated. Reporting of unintentional weight loss.  Reporting patient weight 77 pounds 3 months ago now she is approximately 60 pounds.  Assessment & Plan:   Sever Hypokalemia  - Replaced aggressively, became hyperkalemic, treated with Kayexalate Now potassium stable at 3.6   Active Problems:   Dysphagia with? Gastric outlet obstruction Prior history of esophagitis with stenosed Billroth I gastroduodenostomy with dilatation (last EGD in 05/2016). Patient reports ongoing dysphagia with lump sensation in the esophagus with solid food and purging it out almost every other day.  Currently tolerating a liquid, GI has recommended to advance diet. Following, possible EGD today   Addendum: Status post EGD: Severe erosive reflux esophagitis involving distal third of the esophagus. Sliding hiatal hernia. Noted amount of food debris in the stomach.  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  GI recommended close observation, encourage p.o. intake able to tolerate p.o. will be cleared from GI standpoint discharge in a.m.        Protein-calorie malnutrition, severe/ Cachexia (HCC)/BMI 11.3 Patient weighs only 60 pounds and reports almost 17 pound weight loss in the past 2 months. Likely secondary to poor by mouth intake and ongoing vomiting.  Nutrition consult, will recommend calorie count, increase dietary supplements post GI workup  Severe hypokalemia Replenished in ED., relatively now hyperkalemic, will treat with p.o. Kayexalate, rechecking labs  Hypotension Secondary to dehydration. Monitor with aggressive hydration.  Anemia -monitoring H&H closely,, no active GI bleed Iron studies reviewed, total iron 44, TIBC 202, B12 normal at 541 Monitoring H&H Pending FOBT.  Reports daily use of Goody powder for arthritic pain and ongoing smoking.  Counseled on stopping use of NSAIDs including Marlin Canary  powder and tobacco use.  Alcohol abuse Reports drinking 6 packs of beer about twice a week (boyfriend says she drinks more than that). Monitor on CIWA.  Prolonged QT interval Monitor on telemetry.  Aggressively replenish potassium. Monitor magnesium. Avoid Q-tip lung agents (hold trazodone and dulera)  Hyponatremia/hypochloremia Mild. Monitor with fluids.    COPD (chronic obstructive pulmonary disease) (HCC) No acute symptoms. Albuterol inhaler as needed. Hold dulera due to prolonged QTC. Nicotine patch. Counseled strongly on smoking cessation.   DVT Prophylaxis : Subcutaneous Lovenox  AM Labs Ordered, also please review Full Orders  Family Communication: Both are present at bedside  Code Status full code  Likely DC to home  Condition : Fair  Consults called: GI (Dr. Karilyn Cota)   DVT prophylaxis:   Heparin Sq    SCDs/compression stockings         Code Status:         Full code  Family Communication: Currently I encouraged her oral hydration is fine you The above findings and plan of care has been discussed with patient and her boyfriend at bedside, they expressed understanding and agreement of above.   Disposition Plan:  1-2 days,          Home  Procedures:  No admission procedures for hospital encounter.   Antimicrobials:  Anti-infectives (From admission, onward)   None      Objective: Vitals:   12/27/17 1420 12/27/17 2020 12/28/17 0554 12/28/17 0915  BP: (!) 94/56 (!) 103/59 102/60 108/73  Pulse: 62 66 64 63  Resp: 18 20 18 13   Temp: 98.6 F (37 C) 98.4 F (36.9 C) 97.6 F (36.4 C) 97.8 F (36.6 C)  TempSrc: Oral Oral Oral Oral  SpO2: 100% 99% 100% 100%  Weight:      Height:        Intake/Output Summary (Last 24 hours) at 12/28/2017 1054 Last data filed at 12/28/2017 0500 Gross per 24 hour  Intake 1184.17 ml  Output -  Net 1184.17 ml   Filed Weights   12/26/17 0909  Weight: 27.2 kg (60 lb)    Examination:  General exam: Cachectic, appears calm and comfortable  Respiratory system: Clear to auscultation. Respiratory effort normal. Cardiovascular system: S1 & S2 heard, RRR. No JVD, murmurs, rubs, gallops or clicks. No pedal  edema. Gastrointestinal system: Abdomen is nondistended, soft and nontender. No organomegaly or masses felt. Normal bowel sounds heard. Central nervous system: Alert and oriented. No focal neurological deficits. Extremities: Symmetric 5 x 5 power.,  Severe generalized muscle wasting noted Skin: No rashes, lesions or ulcers Psychiatry: Judgement and insight appear normal. Mood & affect appropriate.     Data Reviewed: I have personally reviewed following labs and imaging studies  CBC: Recent Labs  Lab 12/26/17 1015 12/27/17 0700  WBC 5.0 4.5  NEUTROABS 2.9  --   HGB 10.2* 10.2*  HCT 32.4* 32.6*  MCV 92.8 95.6  PLT 268 292   Basic Metabolic Panel: Recent Labs  Lab 12/26/17 1015 12/26/17 1103 12/27/17 0700 12/27/17 0938 12/28/17 0506  NA 132*  --  135 133* 137  K 2.6*  --  6.4* 5.5* 3.6  CL 89*  --  105 104 109  CO2 33*  --  27 23 22   GLUCOSE 76  --  74 61* 69  BUN 23*  --  9 8 5*  CREATININE 0.71  --  0.38* 0.40* 0.30*  CALCIUM 7.8*  --  7.6* 7.4* 7.2*  MG  --  2.0  --   --   --  GFR: Estimated Creatinine Clearance: 31.3 mL/min (A) (by C-G formula based on SCr of 0.3 mg/dL (L)). Liver Function Tests: Recent Labs  Lab 12/26/17 1015 12/27/17 0938  AST 27 39  ALT 18 20  ALKPHOS 64 54  BILITOT 0.3 0.3  PROT 5.1* 4.4*  ALBUMIN 2.2* 1.8*   Recent Labs  Lab 12/26/17 1015  LIPASE 18   No results for input(s): AMMONIA in the last 168 hours. Coagulation Profile: No results for input(s): INR, PROTIME in the last 168 hours. Cardiac Enzymes: No results for input(s): CKTOTAL, CKMB, CKMBINDEX, TROPONINI in the last 168 hours. BNP (last 3 results) No results for input(s): PROBNP in the last 8760 hours. HbA1C: No results for input(s): HGBA1C in the last 72 hours. CBG: No results for input(s): GLUCAP in the last 168 hours. Lipid Profile: No results for input(s): CHOL, HDL, LDLCALC, TRIG, CHOLHDL, LDLDIRECT in the last 72 hours. Thyroid Function Tests: No results  for input(s): TSH, T4TOTAL, FREET4, T3FREE, THYROIDAB in the last 72 hours. Anemia Panel: Recent Labs    12/26/17 1400  VITAMINB12 541  TIBC 202*  IRON 44   Sepsis Labs: No results for input(s): PROCALCITON, LATICACIDVEN in the last 168 hours.  No results found for this or any previous visit (from the past 240 hour(s)).     Radiology Studies: Dg Abd Acute W/chest  Result Date: 12/26/2017 CLINICAL DATA:  Nausea and vomiting for 4 years. Weight loss since July. EXAM: DG ABDOMEN ACUTE W/ 1V CHEST COMPARISON:  None. FINDINGS: Normal heart size and mediastinal contours. No acute infiltrate or edema. Small calcified pulmonary granulomas. No effusion or pneumothorax. Nonobstructive bowel gas pattern. Bowel sutures over the epigastrium and pelvis. Upper abdominal surgical clips. No concerning mass effect or gas collection. No evidence of pneumoperitoneum. Osteopenic appearance. Thoracolumbar levoscoliosis. Remote bilateral rib fractures. IMPRESSION: No acute finding in the chest or abdomen. Electronically Signed   By: Marnee Spring M.D.   On: 12/26/2017 11:09    Scheduled Meds: . [MAR Hold] enoxaparin (LOVENOX) injection  30 mg Subcutaneous Q24H  . [MAR Hold] feeding supplement (ENSURE ENLIVE)  237 mL Oral BID BM  . [MAR Hold] folic acid  1 mg Oral Daily  . lidocaine      . meperidine      . midazolam      . [MAR Hold] multivitamin with minerals  1 tablet Oral Daily  . [MAR Hold] nicotine  21 mg Transdermal Daily  . [MAR Hold] pantoprazole (PROTONIX) IV  40 mg Intravenous Q12H  . [MAR Hold] thiamine  100 mg Oral Daily   Or  . [MAR Hold] thiamine  100 mg Intravenous Daily   Continuous Infusions: . sodium chloride 125 mL/hr at 12/28/17 0920     LOS: 1 day    Time spent: >25 minutes   Kendell Bane, MD [facility] Pager [phone]  If 7PM-7AM, please contact night-coverage [email or web address] Password [facility] 12/28/2017, 10:54 AM  This patient means in this patient  with

## 2017-12-28 NOTE — Progress Notes (Signed)
Brief EGD note.  Severe erosive reflux esophagitis involving distal third of the esophagus. Sliding hiatal hernia. Noted amount of food debris in the stomach.  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 Small bowel mucosa distal to stricture normal.

## 2017-12-28 NOTE — Op Note (Signed)
[facility] Patient Name: Kristine Roberts Procedure Date: 12/28/2017 10:23 AM MRN: [id number] Date of Birth: Apr 23, 1956 Attending MD: Lionel December , MD CSN: [id number] Age: 62 Admit Type: Inpatient Procedure:                Upper GI endoscopy Indications:              Management of operative complication: Dilation of                            anastomotic stricture Providers:                Lionel December, MD, Nena Polio, RN, Edythe Clarity,                            Technician Referring MD:             Delfin Gant, MD Medicines:                Lidocaine spray, Meperidine 50 mg IV, Midazolam 5                            mg IV Complications:            No immediate complications. Estimated Blood Loss:     Estimated blood loss was minimal. Procedure:                Pre-Anesthesia Assessment:                           - Prior to the procedure, a History and Physical                            was performed, and patient medications and                            allergies were reviewed. The patient's tolerance of                            previous anesthesia was also reviewed. The risks                            and benefits of the procedure and the sedation                            options and risks were discussed with the patient.                            All questions were answered, and informed consent                            was obtained. Prior Anticoagulants: The patient                            last took previous NSAID medication 3 days prior to  the procedure. ASA Grade Assessment: III - A                            patient with severe systemic disease. After                            reviewing the risks and benefits, the patient was                            deemed in satisfactory condition to undergo the                            procedure.                           After obtaining informed consent, the endoscope was                             passed under direct vision. Throughout the                            procedure, the patient's blood pressure, pulse, and                            oxygen saturations were monitored continuously. The                            [id number]) was introduced through the mouth,                            and advanced to the proximal jejunum. The [id number]                            ([id number]) scope was introduced through the and                            advanced to the. The upper GI endoscopy was                            technically difficult and complex due to narrowing.                            Successful completion of the procedure was aided by                            withdrawing the scope and replacing with the                            'babyscope'. The patient tolerated the procedure                            well. Scope In: 10:53:44 AM Scope Out: 11:18:35 AM Total Procedure Duration: 0 hours 24 minutes 51 seconds  Findings:      The proximal esophagus  and mid esophagus were normal.      LA Grade D (one or more mucosal breaks involving at least 75% of       esophageal circumference) esophagitis was found 26 to 34 cm from the       incisors.      The Z-line was irregular and was found 34 cm from the incisors.      A 2 cm hiatal hernia was present.      Evidence of a Billroth I gastroduodenostomy was found. A gastric pouch       with a large size was found containing food debris.      Two non-bleeding cratered gastric ulcers were found at the anastomosis.       The largest lesion was 12 mm in largest dimension.      A benign-appearing, intrinsic severe stenosis was found at the       anastomosis. This was non-traversed. A TTS dilator was passed through       the scope. Dilation with a 10 mm, an 11 mm and a 12 mm pyloric balloon       dilator was performed. The dilation site was examined and showed       moderate improvement in luminal narrowing and no perforation.       The second portion of the duodenum, third portion of the duodenum and       fourth portion of the duodenum were normal. Impression:               - Normal proximal esophagus and mid esophagus.                           - LA Grade D reflux esophagitis.                           - Z-line irregular, 34 cm from the incisors.                           - 2 cm hiatal hernia.                           - Billroth I gastroduodenostomy was found.                           - Non-bleeding gastric ulcers.                           - Gastric stenosis was found at the anastomosis.                            Dilated.                           - Normal second portion of the duodenum, third                            portion of the duodenum and fourth portion of the                            duodenum.                           -  No specimens collected. Moderate Sedation:      Moderate (conscious) sedation was administered by the endoscopy nurse       and supervised by the endoscopist. The following parameters were       monitored: oxygen saturation, heart rate, blood pressure, CO2       capnography and response to care. Total physician intraservice time was       30 minutes. Recommendation:           - Return patient to hospital ward for ongoing care.                           - Full liquid diet today.                           - Continue present medications.                           - No aspirin, ibuprofen, naproxen, or other                            non-steroidal anti-inflammatory drugs.                           - Repeat upper endoscopy in 2 weeks.                           - Home tomorrow. Procedure Code(s):        --- Professional ---                           [phone], Esophagogastroduodenoscopy, flexible,                            transoral; with dilation of gastric/duodenal                            stricture(s) (eg, balloon, bougie)                           99152, Moderate sedation services  provided by the                            same physician or other qualified health care                            professional performing the diagnostic or                            therapeutic service that the sedation supports,                            requiring the presence of an independent trained                            observer to assist in the monitoring of the  patient's level of consciousness and physiological                            status; initial 15 minutes of intraservice time,                            patient age 69 years or older                           [phone], Moderate sedation services; each additional                            15 minutes intraservice time Diagnosis Code(s):        --- Professional ---                           K21.0, Gastro-esophageal reflux disease with                            esophagitis                           K22.8, Other specified diseases of esophagus                           K44.9, Diaphragmatic hernia without obstruction or                            gangrene                           Z98.0, Intestinal bypass and anastomosis status                           K25.9, Gastric ulcer, unspecified as acute or                            chronic, without hemorrhage or perforation                           K31.89, Other diseases of stomach and duodenum                           K91.89, Other postprocedural complications and                            disorders of digestive system CPT copyright 2016 [employer]. All rights reserved. The codes documented in this report are preliminary and upon coder review may  be revised to meet current compliance requirements. Lionel December, MD Lionel December, MD 12/28/2017 11:34:27 AM This report has been signed electronically. Number of Addenda: 0

## 2017-12-29 LAB — BASIC METABOLIC PANEL
Anion gap: 5 (ref 5–15)
BUN: 5 mg/dL — AB (ref 6–20)
CALCIUM: 6.9 mg/dL — AB (ref 8.9–10.3)
CO2: 21 mmol/L — ABNORMAL LOW (ref 22–32)
CREATININE: 0.4 mg/dL — AB (ref 0.44–1.00)
Chloride: 111 mmol/L (ref 101–111)
GFR calc non Af Amer: >60 mL/min (ref >60)
GLUCOSE: 91 mg/dL (ref 65–99)
Potassium: 3.7 mmol/L (ref 3.5–5.1)
Sodium: 137 mmol/L (ref 135–145)

## 2017-12-29 MED ORDER — LORAZEPAM 2 MG/ML IJ SOLN: 1.0000 mg | INTRAMUSCULAR | Status: DC | PRN

## 2017-12-29 MED ORDER — ALPRAZOLAM 0.5 MG PO TABS
0.5000 mg | ORAL_TABLET | Freq: Four times a day (QID) | ORAL | Status: DC | PRN
  Administered 2017-12-29 – 2017-12-30 (×3): 0.5 mg via ORAL
  Filled 2017-12-29 (×3): qty 1

## 2017-12-29 MED ORDER — MOMETASONE FURO-FORMOTEROL FUM 200-5 MCG/ACT IN AERO
1.0000 | INHALATION_SPRAY | Freq: Two times a day (BID) | RESPIRATORY_TRACT | Status: DC
  Administered 2017-12-29 – 2017-12-30 (×3): 1 via RESPIRATORY_TRACT
  Filled 2017-12-29: qty 8.8

## 2017-12-29 MED ORDER — CALCIUM CARBONATE ANTACID 500 MG PO CHEW
200.0000 mg | CHEWABLE_TABLET | Freq: Three times a day (TID) | ORAL | Status: DC
  Administered 2017-12-29 – 2017-12-30 (×2): 200 mg via ORAL
  Filled 2017-12-29: qty 1

## 2017-12-29 MED ORDER — MOMETASONE FURO-FORMOTEROL FUM 200-5 MCG/ACT IN AERO
INHALATION_SPRAY | RESPIRATORY_TRACT | Status: AC
  Filled 2017-12-29: qty 8.8

## 2017-12-29 NOTE — Progress Notes (Signed)
  Subjective:  Patient has no complaints.  She states she ate three fourths of her breakfast and three fourths of her lunch.  She denies nausea vomiting abdominal pain or fullness.  She also denies melena or rectal bleeding.  Objective: Blood pressure 102/82, pulse 81, temperature 98.1 F (36.7 C), temperature source Oral, resp. rate 18, height 5\' 1"  (1.549 m), weight 60 lb (27.2 kg), SpO2 98 %. Patient is alert and in no acute distress. Abdomen is flat soft and nontender without organomegaly or masses.  Labs/studies Results:  Recent Labs    01/01/2018 0700  WBC 4.5  HGB 10.2*  HCT 32.6*  PLT 292    BMET  Recent Labs    01-01-2018 0938 12/28/17 0506 12/29/17 0437  NA 133* 137 137  K 5.5* 3.6 3.7  CL 104 109 111  CO2 23 22 21*  GLUCOSE 61* 69 91  BUN 8 5* 5*  CREATININE 0.40* 0.30* 0.40*  CALCIUM 7.4* 7.2* 6.9*    LFT  Recent Labs    01/01/2018 0938  PROT 4.4*  ALBUMIN 1.8*  AST 39  ALT 20  ALKPHOS 54  BILITOT 0.3     Assessment:  #1.  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  Patient underwent balloon dilation to 12 mm yesterday.  Diet has been advanced and she is not having any nausea or abdominal pain.  Noticed patient is on enoxaparin which was not discontinued for the procedure.  I did not notice significant bleed during the procedure. Her disease is secondary to chronic NSAID abuse and it is about time that she stop using these medications.  #2.  We are erosive reflux esophagitis secondary to #1.  #3.  Weight loss.  Patient's BMI is less than 12.  Recommendations:  DC enoxaparin. CBC in a.m. SCDs. Repeat dilation in 2-3 weeks.  My office will contact patient.

## 2017-12-29 NOTE — Progress Notes (Signed)
PROGRESS NOTE    Patient: Kristine Roberts     PCP: [facility]                    DOB: Oct 28, 1956            DOA: 12/26/2017 WGN:[id number]             DOS: 12/29/2017, 6:34 PM   Date of Service: the patient was seen and examined on 12/29/2017 Subjective:   She is able to tolerate about two thirds of a meal, significant other at bedside, questions answered, complains of nausea and some epigastric discomfort, no vomiting or diarrhea  ----------------------------------------------------------------------------------------------------------------------  Brief Narrative:   Kristine Roberts  is a 62 y.o. female, with history of history of Billroth type I in? 2001 for peptic ulcer disease, ongoing tobacco and alcohol use who follows with GI Dr Karilyn Cota (last EGD in 05/2016) for dysphagia and significant weight loss was sent to the ED by physician at the [facility] department for ongoing weight loss and dysphagia with vomiting. Patient reports that for several months she has been having dysphagia with food getting stuck in her mid esophagus and having to purge it out to relieve it. She complains symptoms mainly with solid food. As per GI note from 8/27 inch was also evaluated by psychiatrist for possible bulimia. EGD in 05/2016 showed reflux esophagitis with hiatal hernia and gastric stenosis with circumferential ulcer at the gastroduodenal anastomosis that was dilated. Reporting of unintentional weight loss.  Reporting patient weight 77 pounds 3 months ago now she is approximately 60 pounds.  Prior history of esophagitis with stenosed Billroth I gastroduodenostomy with dilatation (last EGD in 05/2016). Patient reports ongoing dysphagia with lump sensation in the esophagus with solid food and purging it out almost every other day.    Assessment & Plan:   #1.  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  s/p EGD with t balloon dilation to 12 mm on  12/28/17, continue IV Protonix 40 every 12 hours, repeat CBC in a.m.  Tolerating diet okay for now, Lovenox has been discontinued by GI service, avoid ibuprofen/Advil/Aleve/Motrin/Goody Powders/Naproxen/BC powders as these will make you more likely to bleed and can cause stomach ulcers.  Repeat CBC in a.m.  2) erosive reflux esophagitis-secondary to #1 above, treat as above #1  3) anorexia/weight loss/Electrolyte Abnormalities/Protein-calorie malnutrition, severe/ Cachexia (HCC)/BMI 11.3-  -secondary to 1 #2 above, nutritional  supplements advised, monitor electrolytes, give Tums for calcium replacement   4)Anemia-Lovenox has been discontinued as above, watch for GI bleed, repeat CBC in a.m.  5)h/o Etoh abuse- Reports drinking 6 packs of beer about twice a week (boyfriend says she drinks more than that). Monitor on CIWA.  6)COPD/tobacco abuse-no acute flareup at this time, smoking cessation advised, continue bronchodilators   DVT Prophylaxis : SCDs  AM Labs Ordered, also please review Full Orders  Family Communication: Significant order present at bedside  Code Status full code  Likely DC to home  Condition : Stable  Consults called: GI (Dr. Karilyn Cota)   Procedures:  EGD 12/28/2017  Antimicrobials:  Anti-infectives (From admission, onward)   None      Objective: Vitals:   12/29/17 0528 12/29/17 0822 12/29/17 0834 12/29/17 1357  BP: 99/83 112/86  102/82  Pulse: 75 79  81  Resp: 20   18  Temp: (!) 97.5 F (36.4 C)   98.1 F (36.7 C)  TempSrc: Oral   Oral  SpO2: 100%  97% 98%  Weight:      Height:        Intake/Output Summary (Last 24 hours) at 12/29/2017 1834 Last data filed at 12/29/2017 1521 Gross per 24 hour  Intake 6473.75 ml  Output -  Net 6473.75 ml   Filed Weights   12/26/17 0909  Weight: 27.2 kg (60 lb)    Examination:  General exam: Cachectic, appears calm and comfortable  Respiratory system: Clear to auscultation. Respiratory effort  normal. Cardiovascular system: S1 & S2 heard, RRR. No JVD, murmurs, rubs, gallops or clicks. No pedal edema. Gastrointestinal system: Abdomen is nondistended, soft and mild epigastric discomfort with no rebound or guarding, bowel sounds are good  Central nervous system: Alert and oriented. No focal neurological deficits Extremities: Symmetric 5 x 5 power.,  Severe generalized muscle wasting noted Skin: No rashes, lesions or ulcers Psychiatry: Slightly anxious,  Data Reviewed: I have personally reviewed following labs and imaging studies  CBC: Recent Labs  Lab 12/26/17 1015 12/27/17 0700  WBC 5.0 4.5  NEUTROABS 2.9  --   HGB 10.2* 10.2*  HCT 32.4* 32.6*  MCV 92.8 95.6  PLT 268 292   Basic Metabolic Panel: Recent Labs  Lab 12/26/17 1015 12/26/17 1103 12/27/17 0700 12/27/17 0938 12/28/17 0506 12/29/17 0437  NA 132*  --  135 133* 137 137  K 2.6*  --  6.4* 5.5* 3.6 3.7  CL 89*  --  105 104 109 111  CO2 33*  --  27 23 22  21*  GLUCOSE 76  --  74 61* 69 91  BUN 23*  --  9 8 5* 5*  CREATININE 0.71  --  0.38* 0.40* 0.30* 0.40*  CALCIUM 7.8*  --  7.6* 7.4* 7.2* 6.9*  MG  --  2.0  --   --   --   --    GFR: Estimated Creatinine Clearance: 31.3 mL/min (A) (by C-G formula based on SCr of 0.4 mg/dL (L)). Liver Function Tests: Recent Labs  Lab 12/26/17 1015 12/27/17 0938  AST 27 39  ALT 18 20  ALKPHOS 64 54  BILITOT 0.3 0.3  PROT 5.1* 4.4*  ALBUMIN 2.2* 1.8*   Recent Labs  Lab 12/26/17 1015  LIPASE 18   No results for input(s): AMMONIA in the last 168 hours. Coagulation Profile: No results for input(s): INR, PROTIME in the last 168 hours. Cardiac Enzymes: No results for input(s): CKTOTAL, CKMB, CKMBINDEX, TROPONINI in the last 168 hours. BNP (last 3 results) No results for input(s): PROBNP in the last 8760 hours. HbA1C: No results for input(s): HGBA1C in the last 72 hours. CBG: No results for input(s): GLUCAP in the last 168 hours. Lipid Profile: No results for  input(s): CHOL, HDL, LDLCALC, TRIG, CHOLHDL, LDLDIRECT in the last 72 hours. Thyroid Function Tests: No results for input(s): TSH, T4TOTAL, FREET4, T3FREE, THYROIDAB in the last 72 hours. Anemia Panel: No results for input(s): VITAMINB12, FOLATE, FERRITIN, TIBC, IRON, RETICCTPCT in the last 72 hours. Sepsis Labs: No results for input(s): PROCALCITON, LATICACIDVEN in the last 168 hours.  No results found for this or any previous visit (from the past 240 hour(s)).     Radiology Studies: No results found.  Scheduled Meds: . calcium carbonate  200 mg of elemental calcium Oral TID  . feeding supplement (ENSURE ENLIVE)  237 mL Oral BID BM  . folic acid  1 mg Oral Daily  . mometasone-formoterol  1 puff Inhalation BID  . multivitamin with minerals  1 tablet Oral Daily  . nicotine  21 mg Transdermal Daily  . pantoprazole (PROTONIX) IV  40 mg Intravenous Q12H  . thiamine  100 mg Oral Daily   Or  . thiamine  100 mg Intravenous Daily   Continuous Infusions: . sodium chloride 125 mL/hr at 12/28/17 1902     LOS: 2 days    Time spent: >25 minutes   Shon Hale, MD [facility] Pager [phone]  If 7PM-7AM, please contact night-coverage [email or web address] Password [facility] 12/29/2017, 6:34 PM  This patient means in this patient with

## 2017-12-30 DIAGNOSIS — E876 Hypokalemia: Secondary | ICD-10-CM

## 2017-12-30 LAB — CBC
HCT: 41 % (ref 36.0–46.0)
HEMOGLOBIN: 12.6 g/dL (ref 12.0–15.0)
MCH: 29.6 pg (ref 26.0–34.0)
MCHC: 30.7 g/dL (ref 30.0–36.0)
MCV: 96.5 fL (ref 78.0–100.0)
PLATELETS: 342 10*3/uL (ref 150–400)
RBC: 4.25 MIL/uL (ref 3.87–5.11)
RDW: 16.7 % — ABNORMAL HIGH (ref 11.5–15.5)
WBC: 7.8 10*3/uL (ref 4.0–10.5)

## 2017-12-30 LAB — BASIC METABOLIC PANEL
Anion gap: 8 (ref 5–15)
BUN: <5 mg/dL — ABNORMAL LOW (ref 6–20)
CHLORIDE: 113 mmol/L — AB (ref 101–111)
CO2: 17 mmol/L — ABNORMAL LOW (ref 22–32)
CREATININE: 0.33 mg/dL — AB (ref 0.44–1.00)
Calcium: 7.3 mg/dL — ABNORMAL LOW (ref 8.9–10.3)
GFR calc non Af Amer: >60 mL/min (ref >60)
GLUCOSE: 72 mg/dL (ref 65–99)
Potassium: 3.2 mmol/L — ABNORMAL LOW (ref 3.5–5.1)
Sodium: 138 mmol/L (ref 135–145)

## 2017-12-30 MED ORDER — ADULT MULTIVITAMIN W/MINERALS CH: 1.0000 | ORAL_TABLET | Freq: Every day | ORAL | 2 refills | Status: DC

## 2017-12-30 MED ORDER — POTASSIUM CHLORIDE CRYS ER 20 MEQ PO TBCR
40.0000 meq | EXTENDED_RELEASE_TABLET | Freq: Once | ORAL | Status: AC
Start: 2017-12-30 — End: 2017-12-30
  Administered 2017-12-30: 40 meq via ORAL
  Filled 2017-12-30: qty 2

## 2017-12-30 MED ORDER — HYDROCODONE-ACETAMINOPHEN 5-325 MG PO TABS: 1.0000 | ORAL_TABLET | ORAL | 0 refills | Status: DC | PRN

## 2017-12-30 MED ORDER — CALCIUM CARBONATE ANTACID 500 MG PO CHEW: 1.0000 | CHEWABLE_TABLET | Freq: Three times a day (TID) | ORAL | 3 refills | Status: DC

## 2017-12-30 MED ORDER — ONDANSETRON HCL 4 MG/2ML IJ SOLN: 4.0000 mg | Freq: Four times a day (QID) | INTRAMUSCULAR | 0 refills | Status: DC | PRN

## 2017-12-30 MED ORDER — ONDANSETRON HCL 4 MG PO TABS: 4.0000 mg | ORAL_TABLET | Freq: Three times a day (TID) | ORAL | 0 refills | Status: DC | PRN

## 2017-12-30 MED ORDER — FOLIC ACID 1 MG PO TABS: 1.0000 mg | ORAL_TABLET | Freq: Every day | ORAL | 3 refills | Status: DC

## 2017-12-30 MED ORDER — THIAMINE HCL 100 MG PO TABS: 100.0000 mg | ORAL_TABLET | Freq: Every day | ORAL | 3 refills | Status: DC

## 2017-12-30 MED ORDER — PANTOPRAZOLE SODIUM 40 MG PO TBEC: 40.0000 mg | DELAYED_RELEASE_TABLET | Freq: Two times a day (BID) | ORAL | 3 refills | Status: DC

## 2017-12-30 MED ORDER — ENSURE ENLIVE PO LIQD: 237.0000 mL | Freq: Three times a day (TID) | ORAL | 12 refills | Status: DC

## 2017-12-30 MED ORDER — NICOTINE 21 MG/24HR TD PT24: 21.0000 mg | MEDICATED_PATCH | Freq: Every day | TRANSDERMAL | 0 refills | Status: DC

## 2017-12-30 MED ORDER — TRAZODONE HCL 100 MG PO TABS: 100.0000 mg | ORAL_TABLET | Freq: Every day | ORAL | 0 refills | Status: DC

## 2017-12-30 NOTE — Discharge Summary (Signed)
Kristine Roberts, is a 62 y.o. female  DOB 1956-07-31  MRN [id number].  Admission date:  12/26/2017  Admitting Physician  Eddie North, MD  Discharge Date:  12/30/2017   Primary MD  [facility], [facility]  Recommendations for primary care physician for things to follow:   Admission Diagnosis  WEIGHT LOSS   Discharge Diagnosis  WEIGHT LOSS    Principal Problem:   Hypokalemia Active Problems:   Dysphagia   Gastric outlet obstruction   COPD (chronic obstructive pulmonary disease) (HCC)   Protein-calorie malnutrition, severe   Cachexia (HCC)   Hypotension   Tobacco abuse   ETOH abuse   Anemia   Prolonged QT interval   Pressure injury of skin      Past Medical History:  Diagnosis Date  . COPD (chronic obstructive pulmonary disease) (HCC)   . Dysphagia   . Esophagitis   . Gastric outlet obstruction   . Gastric stenosis   . Hiatal hernia   . PUD (peptic ulcer disease)     Past Surgical History:  Procedure Laterality Date  . APPENDECTOMY    . BALLOON DILATION N/A 03/05/2016   Procedure: BALLOON DILATION;  Surgeon: Malissa Hippo, MD;  [facility];  Service: Endoscopy;  Laterality: N/A;  pyloric channel dialtaion  . CHOLECYSTECTOMY    . ESOPHAGEAL DILATION N/A 03/03/2016   Procedure: ESOPHAGEAL DILATION;  Surgeon: Malissa Hippo, MD;  [facility];  Service: Endoscopy;  Laterality: N/A;  . ESOPHAGEAL DILATION N/A 05/07/2016   Procedure: ESOPHAGEAL DILATION;  Surgeon: Malissa Hippo, MD;  [facility];  Service: Endoscopy;  Laterality: N/A;  . ESOPHAGOGASTRODUODENOSCOPY N/A 03/03/2016   Procedure: ESOPHAGOGASTRODUODENOSCOPY (EGD);  Surgeon: Malissa Hippo, MD;  [facility];  Service: Endoscopy;  Laterality: N/A;  2:00  . ESOPHAGOGASTRODUODENOSCOPY N/A 05/07/2016   Procedure: ESOPHAGOGASTRODUODENOSCOPY (EGD);  Surgeon: Malissa Hippo,  MD;  [facility];  Service: Endoscopy;  Laterality: N/A;  855 - moved to 6/2 @ 10:15 - Ann notified pt  . ESOPHAGOGASTRODUODENOSCOPY (EGD) WITH PROPOFOL N/A 03/05/2016   Procedure: ESOPHAGOGASTRODUODENOSCOPY (EGD) WITH PROPOFOL Anastomotic stricture dilation ;  Surgeon: Malissa Hippo, MD;  [facility];  Service: Endoscopy;  Laterality: N/A;  to be done in OR under fluoro  . STOMACH SURGERY       HPI  from the history and physical done on the day of admission:   Kristine Roberts  is a 62 y.o. female, with history of history of Billroth type I in? 2001 for peptic ulcer disease, ongoing tobacco and alcohol use who follows with GI Dr Karilyn Cota (last EGD in 05/2016) for dysphagia and significant weight loss was sent to the ED by physician [facility] for ongoing weight loss and dysphagia with vomiting. Patient reports that for several months she has been having dysphagia with food getting stuck in her mid esophagus and having to purge it out to relieve it. She complains symptoms mainly with solid food. As  per GI note from 8/27 inch was also evaluated by psychiatrist for possible bulimia. EGD in 05/2016 showed reflux esophagitis with hiatal hernia and gastric stenosis with circumferential ulcer at the gastroduodenal anastomosis that was dilated. Patient reports that she continues to take Goody powder daily (for arthritis), smokes one pack per day and drinks about 6 packs of beer 1-2 times a week (per boyfriend at bedside she drinks more than that). She reports that she weighed 77 pounds 3 months back and now weighs 60 pounds. She denies any fevers, chills, dizziness, headache, lightheadedness, chest pain, palpitations, shortness of breath, abdominal pain, dysuria or diarrhea. Denies any other NSAID use. Denies hematemesis or melena.  Course in the ED Patient was hypotensive with blood pressure of 84/61 mmHg, heart rate occasionally bradycardic to 40s. Afebrile. She was given 500  mL normal saline bolus followed by maintenance normal saline (1 25 mL/h). Labs showed hemoglobin of 10.2 (about 2 g drop from 2017), sodium of 132, potassium of 2.6, chloride 89, as BUN of 23, albumin of 2.2 otherwise normal LFTs. Magnesium of 2. X-ray of the chest and abdomen was negative for acute findings.  She was given 40 mg by mouth potassium and 10 mg IV KCl. Hospitalist consulted for observation on telemetry. GI to be consulted by ED physician.    Hospital Course:     Brief Narrative:   Kristine Roberts a61 y.o.female,with history of history of Billroth type I in? 2001 for peptic ulcer disease, ongoing tobacco and alcohol use who follows with GIDr Rehman(last EGD in 05/2016) for dysphagia and significant weight loss was sent to the ED by physician at the health department for ongoing weight loss and dysphagia with vomiting. Patient reports that for several months she has been having dysphagia with food getting stuck in her mid esophagus and having to purge it out to relieve it. She complains symptoms mainly with solid food. As per GI note from 8/27 inch was also evaluated by psychiatrist for possible bulimia. EGD in 05/2016 showed reflux esophagitis with hiatal hernia and gastric stenosis with circumferential ulcer at the gastroduodenal anastomosis that was dilated. Reporting of unintentional weight loss.  Reporting patient weight 77 pounds 3 months ago now she is approximately 60 pounds.  Prior history of esophagitis with stenosed Billroth I gastroduodenostomy with dilatation (last EGD in 05/2016). Patient reports ongoing dysphagia with lump sensation in the esophagus with solid food and purging it out almost every other day.   Plan:- 1)Partial Gastric Outlet Obstruction secondary to gastroduodenal anastomotic stricture.s/p EGD with t balloon dilation to 12 mm on 12/28/17, continue IV Protonix 40 every 12 hours, repeat CBC in a.m.  Tolerating diet well , pt advised to avoid  ibuprofen/Advil/Aleve/Motrin/Goody Powders/Naproxen/BC powders as these will make you more likely to bleed and can cause stomach ulcers.    2)Erosive reflux esophagitis-secondary to #1 above, treat as above #1  3)Anorexia/weight loss/Electrolyte Abnormalities/Protein-calorie malnutrition, severe/ Cachexia (HCC)/BMI 11.3-  -secondary to 1 #2 above, nutritional  supplements advised, monitor electrolytes, give Tums for calcium replacement  4)Chronic Anemia-no evidence of acute blood loss, chronic anemia is probably multifactorial, partly due to poor nutritional intake  5)H/o Etoh Abuse- Reports drinking 6 packs of beer about twice a week (boyfriend says she drinks more than that).  No evidence of delirium tremens at this time, multivitamin, folic acid and thiamine advised.  Cessation of excessive alcohol use strongly advised  6)COPD/Tobacco Abuse- no acute flareup at this time, smoking cessation advised, continue bronchodilators  Discharge Condition: stable  Consults obtained - Gi  Diet and Activity recommendation:  As advised  Discharge Instructions     Discharge Instructions    Call MD for:  difficulty breathing, headache or visual disturbances   Complete by:  As directed    Call MD for:  persistant dizziness or light-headedness   Complete by:  As directed    Call MD for:  severe uncontrolled pain   Complete by:  As directed    Call MD for:  temperature >100.4   Complete by:  As directed    Diet - low sodium heart healthy   Complete by:  As directed    Discharge instructions   Complete by:  As directed    1)Take medications as prescribed 2)Avoid ibuprofen/Advil/Aleve/Motrin/Goody Powders/Naproxen/BC powders as these will make you more likely to bleed and can cause stomach ulcers  3)Ensure or Boost nutritional supplement--1 bottle 3 times a day between meals  4) follow-up with the primary care doctor and also with  gastroenterologist in 1-2 weeks  5) call if dark stools or  concerns about bleeding   Increase activity slowly   Complete by:  As directed       Discharge Medications     Allergies as of 12/30/2017      Reactions   Penicillins    Tongue swells, itching Has patient had a PCN reaction causing immediate rash, facial/tongue/throat swelling, SOB or lightheadedness with hypotension: Yes, had a rash and tongue swelled up. No SOB and lightheadedness. Has patient had a PCN reaction causing severe rash involving mucus membranes or skin necrosis: No Has patient had a PCN reaction that required hospitalization: No Has patient had a PCN reaction occurring within the last 10 years: No If all of the above answers are "NO", then may procee      Medication List    STOP taking these medications   Hydrocodone-Acetaminophen 5-300 MG Tabs Replaced by:  HYDROcodone-acetaminophen 5-325 MG tablet     TAKE these medications   albuterol 108 (90 Base) MCG/ACT inhaler Commonly known as:  PROVENTIL HFA;VENTOLIN HFA Inhale into the lungs every 6 (six) hours as needed for wheezing or shortness of breath.   calcium carbonate 500 MG chewable tablet Commonly known as:  TUMS - dosed in mg elemental calcium Chew 1 tablet (200 mg of elemental calcium total) by mouth 3 (three) times daily.   cyclobenzaprine 10 MG tablet Commonly known as:  FLEXERIL Take 1 tablet by mouth daily.   DULERA 200-5 MCG/ACT Aero Generic drug:  mometasone-formoterol Inhale 1 puff into the lungs daily.   feeding supplement (ENSURE ENLIVE) Liqd Take 237 mLs by mouth 3 (three) times daily between meals. What changed:  when to take this   folic acid 1 MG tablet Commonly known as:  FOLVITE Take 1 tablet (1 mg total) by mouth daily. Start taking on:  12/31/2017   HYDROcodone-acetaminophen 5-325 MG tablet Commonly known as:  NORCO/VICODIN Take 1 tablet by mouth every 4 (four) hours as needed for moderate pain or severe pain. Replaces:  Hydrocodone-Acetaminophen 5-300 MG Tabs   multivitamin  with minerals Tabs tablet Take 1 tablet by mouth daily. Start taking on:  12/31/2017   nicotine 21 mg/24hr patch Commonly known as:  NICODERM CQ - dosed in mg/24 hours Place 1 patch (21 mg total) onto the skin daily. Start taking on:  12/31/2017   ondansetron 4 MG tablet Commonly known as:  ZOFRAN Take 1 tablet (4 mg total) by mouth every 8 (eight) hours as needed  for nausea or vomiting.   ondansetron 4 MG/2ML Soln injection Commonly known as:  ZOFRAN Inject 2 mLs (4 mg total) into the vein every 6 (six) hours as needed for nausea.   pantoprazole 40 MG tablet Commonly known as:  PROTONIX Take 1 tablet (40 mg total) by mouth 2 (two) times daily before a meal.   Potassium 99 MG Tabs Take 99 mg by mouth daily.   tetrahydrozoline 0.05 % ophthalmic solution Place 1 drop into both eyes daily as needed (dry eyes).   thiamine 100 MG tablet Take 1 tablet (100 mg total) by mouth daily. Start taking on:  12/31/2017   traZODone 100 MG tablet Commonly known as:  DESYREL Take 1 tablet (100 mg total) by mouth at bedtime.      Major procedures and Radiology Reports - PLEASE review detailed and final reports for all details, in brief -   Dg Abd Acute W/chest  Result Date: 12/26/2017 CLINICAL DATA:  Nausea and vomiting for 4 years. Weight loss since July. EXAM: DG ABDOMEN ACUTE W/ 1V CHEST COMPARISON:  None. FINDINGS: Normal heart size and mediastinal contours. No acute infiltrate or edema. Small calcified pulmonary granulomas. No effusion or pneumothorax. Nonobstructive bowel gas pattern. Bowel sutures over the epigastrium and pelvis. Upper abdominal surgical clips. No concerning mass effect or gas collection. No evidence of pneumoperitoneum. Osteopenic appearance. Thoracolumbar levoscoliosis. Remote bilateral rib fractures. IMPRESSION: No acute finding in the chest or abdomen. Electronically Signed   By: Marnee Spring M.D.   On: 12/26/2017 11:09    Micro Results   No results found for  this or any previous visit (from the past 240 hour(s)).  Today   Subjective    Kristine Roberts today has no new complaints, eating and drinking well, significant other at bedside, questions answered, no nausea no vomiting or diarrhea          Patient has been seen and examined prior to discharge   Objective   Blood pressure (!) 144/82, pulse 62, temperature 98 F (36.7 C), temperature source Oral, resp. rate 18, height 5\' 1"  (1.549 m), weight 27.2 kg (60 lb), SpO2 92 %.   Intake/Output Summary (Last 24 hours) at 12/30/2017 1345 Last data filed at 12/30/2017 0900 Gross per 24 hour  Intake 1648.75 ml  Output -  Net 1648.75 ml    Exam General exam: Cachectic, appears calm and comfortable  Respiratory system: Clear to auscultation. Respiratory effort normal. Cardiovascular system: S1 & S2 heard, RRR. No JVD, murmurs, rubs, gallops or clicks. No pedal edema. Gastrointestinal system: Abdomen is nondistended, soft, nontender bowel sounds are good  Central nervous system: Alert and oriented. No focal neurological deficits Extremities: Symmetric 5 x 5 power.,  Severe generalized muscle wasting noted Skin: No rashes, lesions or ulcers Psychiatry:  Affect is appropriate   Data Review   CBC w Diff:  Lab Results  Component Value Date   WBC 7.8 12/30/2017   HGB 12.6 12/30/2017   HCT 41.0 12/30/2017   PLT 342 12/30/2017   LYMPHOPCT 30 12/26/2017   MONOPCT 11 12/26/2017   EOSPCT 1 12/26/2017   BASOPCT 1 12/26/2017   CMP:  Lab Results  Component Value Date   NA 138 12/30/2017   K 3.2 (L) 12/30/2017   CL 113 (H) 12/30/2017   CO2 17 (L) 12/30/2017   BUN <5 (L) 12/30/2017   CREATININE 0.33 (L) 12/30/2017   PROT 4.4 (L) 12/27/2017   ALBUMIN 1.8 (L) 12/27/2017   BILITOT 0.3 12/27/2017  ALKPHOS 54 12/27/2017   AST 39 12/27/2017   ALT 20 12/27/2017    Total Discharge time is about 33 minutes  Shon Hale M.D on 12/30/2017 at 1:45 PM  [facility]   Office   [phone]  Voice Recognition Reubin Milan dictation system was used to create this note, attempts have been made to correct errors. Please contact the author with questions and/or clarifications.

## 2017-12-30 NOTE — Discharge Instructions (Signed)
1)Take medications as prescribed  2)Avoid ibuprofen/Advil/Aleve/Motrin/Goody Powders/Naproxen/BC powders as these will make you more likely to bleed and can cause stomach ulcers  3)Ensure or Boost Nutritional Supplement--1 bottle 3 times a day between meals  4)Follow-up with the Primary Care Doctor and also with  Gastroenterologist in 1-2 weeks  5)Call if dark stools or concerns about bleeding

## 2018-01-02 ENCOUNTER — Encounter (HOSPITAL_COMMUNITY): Payer: Self-pay | Admitting: Internal Medicine

## 2018-01-11 ENCOUNTER — Other Ambulatory Visit (INDEPENDENT_AMBULATORY_CARE_PROVIDER_SITE_OTHER): Payer: Self-pay | Admitting: *Deleted

## 2018-01-11 ENCOUNTER — Encounter (INDEPENDENT_AMBULATORY_CARE_PROVIDER_SITE_OTHER): Payer: Self-pay | Admitting: *Deleted

## 2018-01-11 DIAGNOSIS — K259 Gastric ulcer, unspecified as acute or chronic, without hemorrhage or perforation: Secondary | ICD-10-CM

## 2018-01-26 ENCOUNTER — Ambulatory Visit (HOSPITAL_COMMUNITY)
Admission: RE | Admit: 2018-01-26 | Discharge: 2018-01-26 | Disposition: A | Discharge status: Home / Self Care | Payer: Medicaid Other | Source: Ambulatory Visit | Attending: Internal Medicine | Admitting: Internal Medicine

## 2018-01-26 ENCOUNTER — Other Ambulatory Visit: Payer: Self-pay

## 2018-01-26 ENCOUNTER — Encounter (HOSPITAL_COMMUNITY): Payer: Self-pay | Admitting: *Deleted

## 2018-01-26 ENCOUNTER — Encounter (HOSPITAL_COMMUNITY)
Admission: RE | Disposition: A | Discharge status: Home / Self Care | Payer: Self-pay | Source: Ambulatory Visit | Attending: Internal Medicine

## 2018-01-26 DIAGNOSIS — Z980 Intestinal bypass and anastomosis status: Secondary | ICD-10-CM | POA: Insufficient documentation

## 2018-01-26 DIAGNOSIS — J449 Chronic obstructive pulmonary disease, unspecified: Secondary | ICD-10-CM | POA: Diagnosis not present

## 2018-01-26 DIAGNOSIS — Z880 Allergy status to penicillin: Secondary | ICD-10-CM | POA: Diagnosis not present

## 2018-01-26 DIAGNOSIS — K228 Other specified diseases of esophagus: Secondary | ICD-10-CM | POA: Insufficient documentation

## 2018-01-26 DIAGNOSIS — F17210 Nicotine dependence, cigarettes, uncomplicated: Secondary | ICD-10-CM | POA: Insufficient documentation

## 2018-01-26 DIAGNOSIS — R600 Localized edema: Secondary | ICD-10-CM | POA: Insufficient documentation

## 2018-01-26 DIAGNOSIS — K449 Diaphragmatic hernia without obstruction or gangrene: Secondary | ICD-10-CM | POA: Insufficient documentation

## 2018-01-26 DIAGNOSIS — Z79899 Other long term (current) drug therapy: Secondary | ICD-10-CM | POA: Insufficient documentation

## 2018-01-26 DIAGNOSIS — K222 Esophageal obstruction: Secondary | ICD-10-CM

## 2018-01-26 DIAGNOSIS — Z8711 Personal history of peptic ulcer disease: Secondary | ICD-10-CM | POA: Diagnosis not present

## 2018-01-26 DIAGNOSIS — K311 Adult hypertrophic pyloric stenosis: Secondary | ICD-10-CM | POA: Insufficient documentation

## 2018-01-26 DIAGNOSIS — K259 Gastric ulcer, unspecified as acute or chronic, without hemorrhage or perforation: Secondary | ICD-10-CM

## 2018-01-26 DIAGNOSIS — K210 Gastro-esophageal reflux disease with esophagitis: Secondary | ICD-10-CM | POA: Insufficient documentation

## 2018-01-26 HISTORY — PX: ESOPHAGEAL DILATION: SHX303

## 2018-01-26 HISTORY — PX: ESOPHAGOGASTRODUODENOSCOPY: SHX5428

## 2018-01-26 SURGERY — EGD (ESOPHAGOGASTRODUODENOSCOPY)
Anesthesia: Moderate Sedation

## 2018-01-26 MED ORDER — STERILE WATER FOR IRRIGATION IR SOLN
Status: DC | PRN
  Administered 2018-01-26: 15 mL

## 2018-01-26 MED ORDER — SODIUM CHLORIDE 0.9 % IV SOLN
INTRAVENOUS | Status: DC
  Administered 2018-01-26: 15:00:00 via INTRAVENOUS

## 2018-01-26 MED ORDER — PANTOPRAZOLE SODIUM 40 MG PO TBEC: 40.0000 mg | DELAYED_RELEASE_TABLET | Freq: Every day | ORAL | 5 refills | Status: DC

## 2018-01-26 MED ORDER — MEPERIDINE HCL 50 MG/ML IJ SOLN
INTRAMUSCULAR | Status: DC | PRN
  Administered 2018-01-26 (×2): 20 mg via INTRAVENOUS

## 2018-01-26 MED ORDER — LIDOCAINE VISCOUS 2 % MT SOLN
OROMUCOSAL | Status: AC
  Filled 2018-01-26: qty 15

## 2018-01-26 MED ORDER — MEPERIDINE HCL 50 MG/ML IJ SOLN
INTRAMUSCULAR | Status: AC
  Filled 2018-01-26: qty 1

## 2018-01-26 MED ORDER — MIDAZOLAM HCL 5 MG/5ML IJ SOLN
INTRAMUSCULAR | Status: AC
  Filled 2018-01-26: qty 10

## 2018-01-26 MED ORDER — MIDAZOLAM HCL 5 MG/5ML IJ SOLN
INTRAMUSCULAR | Status: DC | PRN
  Administered 2018-01-26 (×2): 2 mg via INTRAVENOUS

## 2018-01-26 NOTE — H&P (Signed)
Kristine Roberts is an 62 y.o. female.   Chief Complaint: Patient is here for EGD and dilation of anastomotic gastroduodenal stricture. HPI: Patient is 62 year old Caucasian female with several year history of peptic ulcer disease who has undergone surgery in the past and now has developed recurrent disease with gastric outlet obstruction.  She was hospitalized over a month ago with severe malnutrition and hypokalemia.  She was noted to have severe reflux esophagitis food debris in her stomach and gastrojejunal anastomotic stricture which was dilated to 12 mm. Patient is feeling better.  She states she has gained 6 pounds even though lower extremity edema is going down.  She vomits 3-4 times a week.  She says the last time she threw up was 2 days ago.  She reassures me that she is not taking BC powder or similar medications.  Past Medical History:  Diagnosis Date  . COPD (chronic obstructive pulmonary disease) (HCC)   . Dysphagia   . Esophagitis   . Gastric outlet obstruction   . Gastric stenosis   . Hiatal hernia   . PUD (peptic ulcer disease)     Past Surgical History:  Procedure Laterality Date  . APPENDECTOMY    . BALLOON DILATION N/A 03/05/2016   Procedure: BALLOON DILATION;  Surgeon: Malissa Hippo, MD;  [facility];  Service: Endoscopy;  Laterality: N/A;  pyloric channel dialtaion  . CHOLECYSTECTOMY    . ESOPHAGEAL DILATION N/A 03/03/2016   Procedure: ESOPHAGEAL DILATION;  Surgeon: Malissa Hippo, MD;  [facility];  Service: Endoscopy;  Laterality: N/A;  . ESOPHAGEAL DILATION N/A 05/07/2016   Procedure: ESOPHAGEAL DILATION;  Surgeon: Malissa Hippo, MD;  [facility];  Service: Endoscopy;  Laterality: N/A;  . ESOPHAGOGASTRODUODENOSCOPY N/A 03/03/2016   Procedure: ESOPHAGOGASTRODUODENOSCOPY (EGD);  Surgeon: Malissa Hippo, MD;  [facility];  Service: Endoscopy;  Laterality: N/A;  2:00  . ESOPHAGOGASTRODUODENOSCOPY N/A 05/07/2016   Procedure:  ESOPHAGOGASTRODUODENOSCOPY (EGD);  Surgeon: Malissa Hippo, MD;  [facility];  Service: Endoscopy;  Laterality: N/A;  855 - moved to 6/2 @ 10:15 - Ann notified pt  . ESOPHAGOGASTRODUODENOSCOPY N/A 12/28/2017   Procedure: ESOPHAGOGASTRODUODENOSCOPY (EGD) with stricture dilation;  Surgeon: Malissa Hippo, MD;  [facility];  Service: Endoscopy;  Laterality: N/A;  . ESOPHAGOGASTRODUODENOSCOPY (EGD) WITH PROPOFOL N/A 03/05/2016   Procedure: ESOPHAGOGASTRODUODENOSCOPY (EGD) WITH PROPOFOL Anastomotic stricture dilation ;  Surgeon: Malissa Hippo, MD;  [facility];  Service: Endoscopy;  Laterality: N/A;  to be done in OR under fluoro  . STOMACH SURGERY      Family History  Problem Relation Age of Onset  . Alzheimer's disease Mother   . COPD Mother   . Throat cancer Father   . Breast cancer Sister    Social History:  reports that she has been smoking cigarettes.  She has a 20.00 pack-year smoking history. she has never used smokeless tobacco. She reports that she drinks about 7.2 oz of alcohol per week. She reports that she does not use drugs.  Allergies:  Allergies  Allergen Reactions  . Penicillins Itching, Swelling and Other (See Comments)    Tongue swells Has patient had a PCN reaction causing immediate rash, facial/tongue/throat swelling, SOB or lightheadedness with hypotension: Yes, had a rash and tongue swelled up. No SOB and lightheadedness. Has patient had a PCN reaction causing severe rash involving mucus membranes or skin necrosis: No Has patient had a PCN reaction that  required hospitalization: No Has patient had a PCN reaction occurring within the last 10 years: No If all of the above answers are "NO", then may procee    Medications Prior to Admission  Medication Sig Dispense Refill  . albuterol (PROVENTIL HFA;VENTOLIN HFA) 108 (90 Base) MCG/ACT inhaler Inhale 2 puffs into the lungs every 6 (six) hours as needed for wheezing or shortness of  breath.     . calcium carbonate (TUMS - DOSED IN MG ELEMENTAL CALCIUM) 500 MG chewable tablet Chew 1 tablet (200 mg of elemental calcium total) by mouth 3 (three) times daily. (Patient taking differently: Chew 2 tablets by mouth 3 (three) times daily as needed for indigestion or heartburn. ) 90 tablet 3  . cyclobenzaprine (FLEXERIL) 10 MG tablet Take 10 mg by mouth daily as needed for muscle spasms.   1  . DULERA 200-5 MCG/ACT AERO Inhale 1 puff into the lungs 2 (two) times daily.   6  . feeding supplement, ENSURE ENLIVE, (ENSURE ENLIVE) LIQD Take 237 mLs by mouth 3 (three) times daily between meals. (Patient taking differently: Take 237 mLs by mouth 2 (two) times daily between meals. ) 30 Bottle 12  . folic acid (FOLVITE) 1 MG tablet Take 1 tablet (1 mg total) by mouth daily. 30 tablet 3  . Multiple Vitamin (MULTIVITAMIN WITH MINERALS) TABS tablet Take 1 tablet by mouth daily. 30 tablet 2  . naproxen sodium (ALEVE) 220 MG tablet Take 220 mg by mouth daily as needed (for pain or headache).    . neomycin-bacitracin-polymyxin (NEOSPORIN) ointment Apply 1 application topically as needed for wound care.    . Potassium 99 MG TABS Take 99 mg by mouth daily.     Marland Kitchen tetrahydrozoline 0.05 % ophthalmic solution Place 1 drop into both eyes daily as needed (dry eyes).    . traZODone (DESYREL) 100 MG tablet Take 1 tablet (100 mg total) by mouth at bedtime. 30 tablet 0  . HYDROcodone-acetaminophen (NORCO/VICODIN) 5-325 MG tablet Take 1 tablet by mouth every 4 (four) hours as needed for moderate pain or severe pain. (Patient not taking: Reported on 01/19/2018) 10 tablet 0  . nicotine (NICODERM CQ - DOSED IN MG/24 HOURS) 21 mg/24hr patch Place 1 patch (21 mg total) onto the skin daily. (Patient not taking: Reported on 01/19/2018) 28 patch 0  . ondansetron (ZOFRAN) 4 MG tablet Take 1 tablet (4 mg total) by mouth every 8 (eight) hours as needed for nausea or vomiting. (Patient not taking: Reported on 01/19/2018) 12 tablet 0   . ondansetron (ZOFRAN) 4 MG/2ML SOLN injection Inject 2 mLs (4 mg total) into the vein every 6 (six) hours as needed for nausea. (Patient not taking: Reported on 01/19/2018) 2 mL 0  . pantoprazole (PROTONIX) 40 MG tablet Take 1 tablet (40 mg total) by mouth 2 (two) times daily before a meal. (Patient not taking: Reported on 01/19/2018) 60 tablet 3  . thiamine 100 MG tablet Take 1 tablet (100 mg total) by mouth daily. (Patient not taking: Reported on 01/19/2018) 30 tablet 3    No results found for this or any previous visit (from the past 48 hour(s)). No results found.  ROS  Blood pressure 105/73, pulse 75, temperature 98.7 F (37.1 C), temperature source Oral, resp. rate 12, SpO2 100 %. Physical Exam  Constitutional:  Patient appears cachectic.  HENT:  Mouth/Throat: Oropharynx is clear and moist.  Eyes: Conjunctivae are normal. No scleral icterus.  Neck: No thyromegaly present.  Cardiovascular: Normal rate, regular rhythm  and normal heart sounds.  No murmur heard. Respiratory: Effort normal and breath sounds normal.  GI:  Abdomen is flat soft and nontender.  No organomegaly or masses.  Musculoskeletal:  Pitting edema involving both feet and ankles.  She also has dry ulcer left leg.  Lymphadenopathy:    She has no cervical adenopathy.  Neurological: She is alert.  Skin: Skin is warm and dry.     Assessment/Plan Gastroduodenal anastomotic stricture with ulceration. Persistent symptoms of nausea and vomiting. EGD with anastomotic stricture dilation.  Lionel December, MD 01/26/2018, 3:15 PM

## 2018-01-26 NOTE — Discharge Instructions (Signed)
Remember you cannot take BC powder Goody powder aspirin Advil Aleve or similar medications. Pantoprazole by mouth 30 minutes before breakfast daily. Resume other medications as before. Low residue diet. No driving for 24 hours. Office visit in 1 month.       Esophagogastroduodenoscopy, Care After Refer to this sheet in the next few weeks. These instructions provide you with information about caring for yourself after your procedure. Your health care provider may also give you more specific instructions. Your treatment has been planned according to current medical practices, but problems sometimes occur. Call your health care provider if you have any problems or questions after your procedure. What can I expect after the procedure? After the procedure, it is common to have:  A sore throat.  Nausea.  Bloating.  Dizziness.  Fatigue.  Follow these instructions at home:  Do not eat or drink anything until the numbing medicine (local anesthetic) has worn off and your gag reflex has returned. You will know that the local anesthetic has worn off when you can swallow comfortably.  Do not drive for 24 hours if you received a medicine to help you relax (sedative).  If your health care provider took a tissue sample for testing during the procedure, make sure to get your test results. This is your responsibility. Ask your health care provider or the department performing the test when your results will be ready.  Keep all follow-up visits as told by your health care provider. This is important. Contact a health care provider if:  You cannot stop coughing.  You are not urinating.  You are urinating less than usual. Get help right away if:  You have trouble swallowing.  You cannot eat or drink.  You have throat or chest pain that gets worse.  You are dizzy or light-headed.  You faint.  You have nausea or vomiting.  You have chills.  You have a fever.  You have severe  abdominal pain.  You have black, tarry, or bloody stools. This information is not intended to replace advice given to you by your health care provider. Make sure you discuss any questions you have with your health care provider. Document Released: 11/08/2012 Document Revised: 04/29/2016 Document Reviewed: 10/16/2015 Elsevier Interactive Patient Education  2018 [employer].      Low-Fiber Diet Fiber is found in fruits, vegetables, and whole grains. A low-fiber diet restricts fibrous foods that are not digested in the small intestine. A diet containing about 10-15 grams of fiber per day is considered low fiber. Low-fiber diets may be used to:  Promote healing and rest the bowel during intestinal flare-ups.  Prevent blockage of a partially obstructed or narrowed gastrointestinal tract.  Reduce fecal weight and volume.  Slow the movement of feces.  You may be on a low-fiber diet as a transitional diet following surgery, after an injury (trauma), or because of a short (acute) or lifelong (chronic) illness. Your health care provider will determine the length of time you need to stay on this diet. What do I need to know about a low-fiber diet? Always check the fiber content on the packaging's Nutrition Facts label, especially on foods from the grains list. Ask your dietitian if you have questions about specific foods that are related to your condition, especially if the food is not listed below. In general, a low-fiber food will have less than 2 g of fiber. What foods can I eat? Grains All breads and crackers made with white flour. Sweet rolls, doughnuts,  waffles, pancakes, [place] toast, bagels. Pretzels, Melba toast, zwieback. Well-cooked cereals, such as cornmeal, farina, or cream cereals. Dry cereals that do not contain whole grains, fruit, or nuts, such as refined corn, wheat, rice, and oat cereals. Potatoes prepared any way without skins, plain pastas and noodles, refined white rice. Use  white flour for baking and making sauces. Use allowed list of grains for casseroles, dumplings, and puddings. Vegetables Strained tomato and vegetable juices. Fresh lettuce, cucumber, spinach. Well-cooked (no skin or pulp) or canned vegetables, such as asparagus, bean sprouts, beets, carrots, green beans, mushrooms, potatoes, pumpkin, spinach, yellow squash, tomato sauce/puree, turnips, yams, and zucchini. Keep servings limited to  cup. Fruits All fruit juices except prune juice. Cooked or canned fruits without skin and seeds, such as applesauce, apricots, cherries, fruit cocktail, grapefruit, grapes, mandarin oranges, melons, peaches, pears, pineapple, and plums. Fresh fruits without skin, such as apricots, avocados, bananas, melons, pineapple, nectarines, and peaches. Keep servings limited to  cup or 1 piece. Meat and Other Protein Sources Ground or well-cooked tender beef, ham, veal, lamb, pork, or poultry. Eggs, plain cheese. Fish, oysters, shrimp, lobster, and other seafood. Liver, organ meats. Smooth nut butters. Dairy All milk products and alternative dairy substitutes, such as soy, rice, almond, and coconut, not containing added whole nuts, seeds, or added fruit. Beverages Decaf coffee, fruit, and vegetable juices or smoothies (small amounts, with no pulp or skins, and with fruits from allowed list), sports drinks, herbal tea. Condiments Ketchup, mustard, vinegar, cream sauce, cheese sauce, cocoa powder. Spices in moderation, such as allspice, basil, bay leaves, celery powder or leaves, cinnamon, cumin powder, curry powder, ginger, mace, marjoram, onion or garlic powder, oregano, paprika, parsley flakes, ground pepper, rosemary, sage, savory, tarragon, thyme, and turmeric. Sweets and Desserts Plain cakes and cookies, pie made with allowed fruit, pudding, custard, cream pie. Gelatin, fruit, ice, sherbet, frozen ice pops. Ice cream, ice milk without nuts. Plain hard candy, honey, jelly,  molasses, syrup, sugar, chocolate syrup, gumdrops, marshmallows. Limit overall sugar intake. Fats and Oil Margarine, butter, cream, mayonnaise, salad oils, plain salad dressings made from allowed foods. Choose healthy fats such as olive oil, canola oil, and omega-3 fatty acids (such as found in salmon or tuna) when possible. Other Bouillon, broth, or cream soups made from allowed foods. Any strained soup. Casseroles or mixed dishes made with allowed foods. The items listed above may not be a complete list of recommended foods or beverages. Contact your dietitian for more options. What foods are not recommended? Grains All whole wheat and whole grain breads and crackers. Multigrains, rye, bran seeds, nuts, or coconut. Cereals containing whole grains, multigrains, bran, coconut, nuts, raisins. Cooked or dry oatmeal, steel-cut oats. Coarse wheat cereals, granola. Cereals advertised as high fiber. Potato skins. Whole grain pasta, wild or brown rice. Popcorn. Coconut flour. Bran, buckwheat, corn bread, multigrains, rye, wheat germ. Vegetables Fresh, cooked or canned vegetables, such as artichokes, asparagus, beet greens, broccoli, Brussels sprouts, cabbage, celery, cauliflower, corn, eggplant, kale, legumes or beans, okra, peas, and tomatoes. Avoid large servings of any vegetables, especially raw vegetables. Fruits Fresh fruits, such as apples with or without skin, berries, cherries, figs, grapes, grapefruit, guavas, kiwis, mangoes, oranges, papayas, pears, persimmons, pineapple, and pomegranate. Prune juice and juices with pulp, stewed or dried prunes. Dried fruits, dates, raisins. Fruit seeds or skins. Avoid large servings of all fresh fruits. Meats and Other Protein Sources Tough, fibrous meats with gristle. Chunky nut butter. Cheese made with seeds, nuts, or other foods  not recommended. Nuts, seeds, legumes (beans, including baked beans), dried peas, beans, lentils. Dairy Yogurt or cheese that  contains nuts, seeds, or added fruit. Beverages Fruit juices with high pulp, prune juice. Caffeinated coffee and teas. Condiments Coconut, maple syrup, pickles, olives. Sweets and Desserts Desserts, cookies, or candies that contain nuts or coconut, chunky peanut butter, dried fruits. Jams, preserves with seeds, marmalade. Large amounts of sugar and sweets. Any other dessert made with fruits from the not recommended list. Other Soups made from vegetables that are not recommended or that contain other foods not recommended. The items listed above may not be a complete list of foods and beverages to avoid. Contact your dietitian for more information. This information is not intended to replace advice given to you by your health care provider. Make sure you discuss any questions you have with your health care provider. Document Released: 05/14/2002 Document Revised: 04/29/2016 Document Reviewed: 10/15/2013 Elsevier Interactive Patient Education  2017 [employer].

## 2018-01-26 NOTE — Progress Notes (Signed)
"  I feel great and I am ready to go eat". Patient boyfriend Carolyne Fiscal with patient in postoperative room.

## 2018-01-26 NOTE — Op Note (Signed)
[facility] Patient Name: Kristine Roberts Procedure Date: 01/26/2018 3:03 PM MRN: [id number] Date of Birth: 04-07-1956 Attending MD: Lionel December , MD CSN: [id number] Age: 62 Admit Type: Outpatient Procedure:                Upper GI endoscopy Indications:              Therapeutic procedure Providers:                Lionel December, MD, Buel Ream. Thomasena Edis RN, RN,                            Burke Keels, Technician Referring MD:              Medicines:                Lidocaine spray, Meperidine 40 mg IV, Midazolam 4                            mg IV Complications:            No immediate complications. Estimated Blood Loss:     Estimated blood loss was minimal. Procedure:                Pre-Anesthesia Assessment:                           - Prior to the procedure, a History and Physical                            was performed, and patient medications and                            allergies were reviewed. The patient's tolerance of                            previous anesthesia was also reviewed. The risks                            and benefits of the procedure and the sedation                            options and risks were discussed with the patient.                            All questions were answered, and informed consent                            was obtained. Prior Anticoagulants: The patient has                            taken no previous anticoagulant or antiplatelet                            agents. ASA Grade Assessment: III - A patient with  severe systemic disease. After reviewing the risks                            and benefits, the patient was deemed in                            satisfactory condition to undergo the procedure.                           After obtaining informed consent, the endoscope was                            passed under direct vision. Throughout the                            procedure, the patient's blood  pressure, pulse, and                            oxygen saturations were monitored continuously. The                            EG-[id number]) scope was introduced through the                            mouth, and advanced to the third part of duodenum.                            The upper GI endoscopy was technically difficult                            and complex due to narrowing. Successful completion                            of the procedure was aided by withdrawing the scope                            and replacing with the 'babyscope'. The patient                            tolerated the procedure well. Scope In: 3:26:24 PM Scope Out: 3:45:39 PM Total Procedure Duration: 0 hours 19 minutes 15 seconds  Findings:      The upper third of the esophagus and middle third of the esophagus were       normal.      LA Grade C (one or more mucosal breaks continuous between tops of 2 or       more mucosal folds, less than 75% circumference) esophagitis was found       29 to 34 cm from the incisors.      The Z-line was irregular and was found 34 cm from the incisors.      A 3 cm hiatal hernia was present.      Evidence of a Billroth I gastroduodenostomy was found. A gastric pouch       with a large size was found containing {skip}none. The gastroduodenal  anastomosis was characterized by healthy appearing mucosa.      A benign-appearing, intrinsic severe stenosis was found at the       anastomosis. This was traversed with ultraslim scope. A TTS dilator was       passed through the scope. Dilation with a 12 mm and a 13.5 mm pyloric       balloon dilator was performed. The dilation site was examined and showed       mild improvement in luminal narrowing and no perforation.      The second portion of the duodenum was normal. Impression:               - Normal upper third of esophagus and middle third                            of esophagus.                           - LA Grade C reflux  esophagitis.                           - Z-line irregular, 34 cm from the incisors.                           - 3 cm hiatal hernia.                           - Billroth I gastroduodenostomy was found,                            characterized by ulcer and highgrade stricture.                            Stricture traversed with ultra slim scope first and                            then with regular scope post dilation. Dilated to                            13.5 mm with balloon dilator.                           - Normal bulb second portion of the duodenum.                           - No specimens collected. Moderate Sedation:      Moderate (conscious) sedation was administered by the endoscopy nurse       and supervised by the endoscopist. The following parameters were       monitored: oxygen saturation, heart rate, blood pressure, CO2       capnography and response to care. Total physician intraservice time was       25 minutes. Recommendation:           - Patient has a contact number available for                            emergencies.  The signs and symptoms of potential                            delayed complications were discussed with the                            patient. Return to normal activities tomorrow.                            Written discharge instructions were provided to the                            patient.                           - Full liquid diet today.                           - Resume previous diet tomorrow.                           - Continue present medications.                           - No aspirin, ibuprofen, naproxen, or other                            non-steroidal anti-inflammatory drugs.                           - Return to GI clinic in 1 month. Procedure Code(s):        --- Professional ---                           [phone], Esophagogastroduodenoscopy, flexible,                            transoral; with dilation of gastric/duodenal                             stricture(s) (eg, balloon, bougie)                           99152, Moderate sedation services provided by the                            same physician or other qualified health care                            professional performing the diagnostic or                            therapeutic service that the sedation supports,                            requiring the presence of an independent trained  observer to assist in the monitoring of the                            patient's level of consciousness and physiological                            status; initial 15 minutes of intraservice time,                            patient age 59 years or older                           [id number], Moderate sedation services; each additional                            15 minutes intraservice time Diagnosis Code(s):        --- Professional ---                           K21.0, Gastro-esophageal reflux disease with                            esophagitis                           K22.8, Other specified diseases of esophagus                           K44.9, Diaphragmatic hernia without obstruction or                            gangrene                           Z98.0, Intestinal bypass and anastomosis status                           K31.89, Other diseases of stomach and duodenum CPT copyright 2016 [employer]. All rights reserved. The codes documented in this report are preliminary and upon coder review may  be revised to meet current compliance requirements. Lionel December, MD Lionel December, MD 01/26/2018 4:02:15 PM This report has been signed electronically. Number of Addenda: 0

## 2018-01-30 ENCOUNTER — Encounter (HOSPITAL_COMMUNITY): Payer: Self-pay | Admitting: Internal Medicine

## 2018-02-15 ENCOUNTER — Emergency Department (HOSPITAL_COMMUNITY): Payer: Medicaid Other

## 2018-02-15 ENCOUNTER — Other Ambulatory Visit: Payer: Self-pay

## 2018-02-15 ENCOUNTER — Emergency Department (HOSPITAL_COMMUNITY)
Admission: EM | Admit: 2018-02-15 | Discharge: 2018-02-15 | Disposition: A | Discharge status: Home / Self Care | Payer: Medicaid Other | Attending: Emergency Medicine | Admitting: Emergency Medicine

## 2018-02-15 ENCOUNTER — Encounter (HOSPITAL_COMMUNITY): Payer: Self-pay | Admitting: Emergency Medicine

## 2018-02-15 DIAGNOSIS — Y33XXXA Other specified events, undetermined intent, initial encounter: Secondary | ICD-10-CM | POA: Insufficient documentation

## 2018-02-15 DIAGNOSIS — F17210 Nicotine dependence, cigarettes, uncomplicated: Secondary | ICD-10-CM | POA: Diagnosis not present

## 2018-02-15 DIAGNOSIS — S81801A Unspecified open wound, right lower leg, initial encounter: Secondary | ICD-10-CM | POA: Insufficient documentation

## 2018-02-15 DIAGNOSIS — S81802A Unspecified open wound, left lower leg, initial encounter: Secondary | ICD-10-CM | POA: Diagnosis not present

## 2018-02-15 DIAGNOSIS — J449 Chronic obstructive pulmonary disease, unspecified: Secondary | ICD-10-CM | POA: Diagnosis not present

## 2018-02-15 DIAGNOSIS — Y9389 Activity, other specified: Secondary | ICD-10-CM | POA: Diagnosis not present

## 2018-02-15 DIAGNOSIS — Y999 Unspecified external cause status: Secondary | ICD-10-CM | POA: Diagnosis not present

## 2018-02-15 DIAGNOSIS — Z79899 Other long term (current) drug therapy: Secondary | ICD-10-CM | POA: Diagnosis not present

## 2018-02-15 DIAGNOSIS — Y929 Unspecified place or not applicable: Secondary | ICD-10-CM | POA: Insufficient documentation

## 2018-02-15 DIAGNOSIS — S8992XA Unspecified injury of left lower leg, initial encounter: Secondary | ICD-10-CM | POA: Diagnosis present

## 2018-02-15 LAB — CBC WITH DIFFERENTIAL/PLATELET
BASOS PCT: 1 %
Basophils Absolute: 0 10*3/uL (ref 0.0–0.1)
EOS ABS: 0 10*3/uL (ref 0.0–0.7)
EOS PCT: 1 %
HEMATOCRIT: 29.9 % — AB (ref 36.0–46.0)
Hemoglobin: 9.1 g/dL — ABNORMAL LOW (ref 12.0–15.0)
Lymphocytes Relative: 23 %
Lymphs Abs: 1.6 10*3/uL (ref 0.7–4.0)
MCH: 29.4 pg (ref 26.0–34.0)
MCHC: 30.4 g/dL (ref 30.0–36.0)
MCV: 96.5 fL (ref 78.0–100.0)
MONOS PCT: 10 %
Monocytes Absolute: 0.7 10*3/uL (ref 0.1–1.0)
Neutro Abs: 4.5 10*3/uL (ref 1.7–7.7)
Neutrophils Relative %: 65 %
Platelets: 384 10*3/uL (ref 150–400)
RBC: 3.1 MIL/uL — ABNORMAL LOW (ref 3.87–5.11)
RDW: 19.9 % — AB (ref 11.5–15.5)
WBC: 6.8 10*3/uL (ref 4.0–10.5)

## 2018-02-15 LAB — BASIC METABOLIC PANEL
Anion gap: 8 (ref 5–15)
BUN: 10 mg/dL (ref 6–20)
CALCIUM: 8.3 mg/dL — AB (ref 8.9–10.3)
CO2: 29 mmol/L (ref 22–32)
CREATININE: 0.65 mg/dL (ref 0.44–1.00)
Chloride: 102 mmol/L (ref 101–111)
GFR calc non Af Amer: >60 mL/min (ref >60)
Glucose, Bld: 85 mg/dL (ref 65–99)
Potassium: 3.2 mmol/L — ABNORMAL LOW (ref 3.5–5.1)
SODIUM: 139 mmol/L (ref 135–145)

## 2018-02-15 MED ORDER — HYDROCODONE-ACETAMINOPHEN 5-325 MG PO TABS: 1.0000 | ORAL_TABLET | Freq: Four times a day (QID) | ORAL | 0 refills | Status: DC | PRN

## 2018-02-15 MED ORDER — SODIUM CHLORIDE 0.9 % IV SOLN
INTRAVENOUS | Status: DC
  Administered 2018-02-15: 14:00:00 via INTRAVENOUS

## 2018-02-15 MED ORDER — VANCOMYCIN HCL IN DEXTROSE 1-5 GM/200ML-% IV SOLN
1000.0000 mg | Freq: Once | INTRAVENOUS | Status: AC
  Administered 2018-02-15: 1000 mg via INTRAVENOUS
  Filled 2018-02-15: qty 200

## 2018-02-15 MED ORDER — DOXYCYCLINE HYCLATE 100 MG PO CAPS: 100.0000 mg | ORAL_CAPSULE | Freq: Two times a day (BID) | ORAL | 0 refills | Status: DC

## 2018-02-15 MED ORDER — DOXYCYCLINE HYCLATE 100 MG PO TABS
100.0000 mg | ORAL_TABLET | Freq: Once | ORAL | Status: AC
  Administered 2018-02-15: 100 mg via ORAL
  Filled 2018-02-15: qty 1

## 2018-02-15 NOTE — ED Provider Notes (Addendum)
[facility] EMERGENCY DEPARTMENT Provider Note   CSN: [id number] Arrival date & time: 02/15/18  1141     History   Chief Complaint Chief Complaint  Patient presents with  . Cellulitis    HPI Kristine Roberts is a 62 y.o. female.  Patient with onset of wounds to her lower extremities.  Started on the left leg a month ago when she was in the hospital the end of January.  She was admitted for hypokalemia at that time.  Patient has been being followed by the health department.  She been wrapping her legs and using Neosporin.  They are not healing.  Now she has wounds on right leg as well has multiple wounds on both legs.  They are purulent and bloody drainage.  No fevers.  Does have pain where the wounds are.  No pain in her feet.  They believe that the wounds were secondary to significant swelling to her lower extremities and started out as blisters.      Past Medical History:  Diagnosis Date  . COPD (chronic obstructive pulmonary disease) (HCC)   . Dysphagia   . Esophagitis   . Gastric outlet obstruction   . Gastric stenosis   . Hiatal hernia   . PUD (peptic ulcer disease)     Patient Active Problem List   Diagnosis Date Noted  . Gastric ulcer 01/11/2018  . Hypokalemia 12/26/2017  . Cachexia (HCC) 12/26/2017  . Hypotension 12/26/2017  . Tobacco abuse 12/26/2017  . ETOH abuse 12/26/2017  . Anemia 12/26/2017  . Prolonged QT interval 12/26/2017  . Pressure injury of skin 12/26/2017  . Protein-calorie malnutrition, severe 03/05/2016  . Gastric outlet obstruction 03/03/2016  . COPD (chronic obstructive pulmonary disease) (HCC) 03/03/2016  . Dysphagia 02/03/2016    Past Surgical History:  Procedure Laterality Date  . APPENDECTOMY    . BALLOON DILATION N/A 03/05/2016   Procedure: BALLOON DILATION;  Surgeon: Malissa Hippo, MD;  [facility];  Service: Endoscopy;  Laterality: N/A;  pyloric channel dialtaion  . CHOLECYSTECTOMY    . ESOPHAGEAL DILATION N/A  03/03/2016   Procedure: ESOPHAGEAL DILATION;  Surgeon: Malissa Hippo, MD;  [facility];  Service: Endoscopy;  Laterality: N/A;  . ESOPHAGEAL DILATION N/A 05/07/2016   Procedure: ESOPHAGEAL DILATION;  Surgeon: Malissa Hippo, MD;  [facility];  Service: Endoscopy;  Laterality: N/A;  . ESOPHAGEAL DILATION  01/26/2018   Procedure: DILATION OF ANASTOMOTIC  STRICTURE;  Surgeon: Malissa Hippo, MD;  [facility];  Service: Endoscopy;;  . ESOPHAGOGASTRODUODENOSCOPY N/A 03/03/2016   Procedure: ESOPHAGOGASTRODUODENOSCOPY (EGD);  Surgeon: Malissa Hippo, MD;  [facility];  Service: Endoscopy;  Laterality: N/A;  2:00  . ESOPHAGOGASTRODUODENOSCOPY N/A 05/07/2016   Procedure: ESOPHAGOGASTRODUODENOSCOPY (EGD);  Surgeon: Malissa Hippo, MD;  [facility];  Service: Endoscopy;  Laterality: N/A;  855 - moved to 6/2 @ 10:15 - Ann notified pt  . ESOPHAGOGASTRODUODENOSCOPY N/A 12/28/2017   Procedure: ESOPHAGOGASTRODUODENOSCOPY (EGD) with stricture dilation;  Surgeon: Malissa Hippo, MD;  [facility];  Service: Endoscopy;  Laterality: N/A;  . ESOPHAGOGASTRODUODENOSCOPY N/A 01/26/2018   Procedure: ESOPHAGOGASTRODUODENOSCOPY (EGD);  Surgeon: Malissa Hippo, MD;  [facility];  Service: Endoscopy;  Laterality: N/A;  255  . ESOPHAGOGASTRODUODENOSCOPY (EGD) WITH PROPOFOL N/A 03/05/2016   Procedure: ESOPHAGOGASTRODUODENOSCOPY (EGD) WITH PROPOFOL Anastomotic stricture dilation ;  Surgeon: Malissa Hippo, MD;  [facility];  Service: Endoscopy;  Laterality:  N/A;  to be done in OR under fluoro  . STOMACH SURGERY      OB History    No data available       Home Medications    Prior to Admission medications   Medication Sig Start Date End Date Taking? Authorizing Provider  albuterol (PROVENTIL HFA;VENTOLIN HFA) 108 (90 Base) MCG/ACT inhaler Inhale 2 puffs into the lungs every 6 (six) hours as needed for wheezing or shortness of  breath.    Yes [provider]  cyclobenzaprine (FLEXERIL) 10 MG tablet Take 10 mg by mouth daily as needed for muscle spasms.  12/22/17  Yes [provider]  DULERA 200-5 MCG/ACT AERO Inhale 1 puff into the lungs 2 (two) times daily.  12/22/17  Yes [provider]  Multiple Vitamin (MULTIVITAMIN WITH MINERALS) TABS tablet Take 1 tablet by mouth daily. 12/31/17  Yes Emokpae, Courage, MD  neomycin-bacitracin-polymyxin (NEOSPORIN) ointment Apply 1 application topically as needed for wound care.   Yes [provider]  pantoprazole (PROTONIX) 40 MG tablet Take 1 tablet (40 mg total) by mouth daily before breakfast. 01/26/18  Yes Rehman, Joline Maxcy, MD  tetrahydrozoline 0.05 % ophthalmic solution Place 1 drop into both eyes daily as needed (dry eyes).   Yes [provider]  traZODone (DESYREL) 100 MG tablet Take 1 tablet (100 mg total) by mouth at bedtime. 12/30/17  Yes Shon Hale, MD  calcium carbonate (TUMS - DOSED IN MG ELEMENTAL CALCIUM) 500 MG chewable tablet Chew 1 tablet (200 mg of elemental calcium total) by mouth 3 (three) times daily. Patient not taking: Reported on 02/15/2018 12/30/17   Shon Hale, MD  feeding supplement, ENSURE ENLIVE, (ENSURE ENLIVE) LIQD Take 237 mLs by mouth 3 (three) times daily between meals. Patient not taking: Reported on 02/15/2018 12/30/17   Shon Hale, MD  folic acid (FOLVITE) 1 MG tablet Take 1 tablet (1 mg total) by mouth daily. Patient not taking: Reported on 02/15/2018 12/31/17   Shon Hale, MD    Family History Family History  Problem Relation Age of Onset  . Alzheimer's disease Mother   . COPD Mother   . Throat cancer Father   . Breast cancer Sister     Social History Social History   Tobacco Use  . Smoking status: Current Every Day Smoker    Packs/day: 1.00    Years: 20.00    Pack years: 20.00    Types: Cigarettes  . Smokeless tobacco: Never Used  Substance Use Topics  . Alcohol use:  Yes    Alcohol/week: 7.2 oz    Types: 12 Cans of beer per week    Comment: beer two a day.   . Drug use: No     Allergies   Penicillins   Review of Systems Review of Systems  Constitutional: Negative for fever.  HENT: Negative for congestion.   Eyes: Negative for redness.  Respiratory: Negative for shortness of breath.   Cardiovascular: Positive for leg swelling. Negative for chest pain.  Gastrointestinal: Negative for abdominal pain.  Genitourinary: Negative for dysuria.  Musculoskeletal: Negative for joint swelling.  Skin: Positive for wound.  Neurological: Negative for syncope.  Hematological: Does not bruise/bleed easily.  Psychiatric/Behavioral: Negative for confusion.     Physical Exam Updated Vital Signs BP 115/81   Pulse 68   Temp 97.8 F (36.6 C) (Oral)   Resp 14   Ht 1.575 m (5\' 2" )   Wt 31.3 kg (69 lb)   SpO2 99%   BMI  12.62 kg/m   Physical Exam  Constitutional: She is oriented to person, place, and time. She appears well-developed and well-nourished. No distress.  HENT:  Head: Normocephalic and atraumatic.  Mouth/Throat: Oropharynx is clear and moist.  Eyes: Conjunctivae and EOM are normal. Pupils are equal, round, and reactive to light.  Neck: Normal range of motion. Neck supple.  Cardiovascular: Normal rate, regular rhythm and normal heart sounds.  Pulmonary/Chest: Effort normal and breath sounds normal. No respiratory distress.  Abdominal: Soft. Bowel sounds are normal.  Musculoskeletal: She exhibits edema.  Bilateral lower extremities with some edema no erythema.  Left lower leg with multiple open wounds.  Some measuring 4 cm in size.  Some with some mild bloody discharge.  Some with an exudate.  There are ulcer-like in nature.  Right leg has one large one measuring about 4 cm on the medial aspect of the lower leg.  Distally good cap refill palpable dorsalis pedis pulse polyp.  Nothing affecting the thighs.  Knee appear normal.  Neurological: She  is alert and oriented to person, place, and time. No cranial nerve deficit or sensory deficit. She exhibits normal muscle tone. Coordination normal.  Skin: Skin is warm. No erythema.  Nursing note and vitals reviewed.    ED Treatments / Results  Labs (all labs ordered are listed, but only abnormal results are displayed) Labs Reviewed  CBC WITH DIFFERENTIAL/PLATELET - Abnormal; Notable for the following components:      Result Value   RBC 3.10 (*)    Hemoglobin 9.1 (*)    HCT 29.9 (*)    RDW 19.9 (*)    All other components within normal limits  BASIC METABOLIC PANEL - Abnormal; Notable for the following components:   Potassium 3.2 (*)    Calcium 8.3 (*)    All other components within normal limits  CULTURE, BLOOD (ROUTINE X 2)  CULTURE, BLOOD (ROUTINE X 2)    EKG  EKG Interpretation None       Radiology Dg Tibia/fibula Left  Result Date: 02/15/2018 CLINICAL DATA:  Soft tissue lesions of the lower legs. EXAM: LEFT TIBIA AND FIBULA - 2 VIEW COMPARISON:  None. FINDINGS: Multiple soft tissue ulcerations of the lower legs. No underlying abnormal bone finding. No sign of radiopaque foreign object. IMPRESSION: Multiple soft tissue ulcerations. Electronically Signed   By: Paulina Fusi M.D.   On: 02/15/2018 13:41   Dg Tibia/fibula Right  Result Date: 02/15/2018 CLINICAL DATA:  Soft tissue ulcerations. EXAM: RIGHT TIBIA AND FIBULA - 2 VIEW COMPARISON:  None FINDINGS: Obvious soft tissue defect of the medial posterior soft tissues of the calf. No evidence of osteomyelitis, fracture or radiopaque foreign object. Small tiny radiopaque foci on the frontal view do not appear to be associated with the ulceration on the lateral view. IMPRESSION: Soft tissue ulceration.  No abnormal bone finding. Electronically Signed   By: Paulina Fusi M.D.   On: 02/15/2018 13:40    Procedures Procedures (including critical care time)  Medications Ordered in ED Medications  0.9 %  sodium chloride  infusion ( Intravenous New Bag/Given 02/15/18 1342)  vancomycin (VANCOCIN) IVPB 1000 mg/200 mL premix (1,000 mg Intravenous New Bag/Given 02/15/18 1438)     Initial Impression / Assessment and Plan / ED Course  I have reviewed the triage vital signs and the nursing notes.  Pertinent labs & imaging results that were available during my care of the patient were reviewed by me and considered in my medical decision making (see chart  for details).      Contacted hospitalist with consideration for admission and wound care.  Patient's labs no leukocytosis.  No real evidence of cellulitis but these wound ulcerations are pretty extensive.  And they are not getting better with home treatment or care from the health department.  Patient does not have a primary care doctor.  Patient given 1 dose of IV vancomycin here.  X-rays of both legs show no bony involved.  Hospitalist felt that patient was more appropriately to be handled outpatient wound care the trouble is that she may not qualify for the [facility] wound care clinic.  Physical therapy is available for wound care but they would need instructions and not exactly sure how to approach these.  Most likely due to the leg swelling.  There is no bony involvement.  No cellulitis.  Case manager got involved.  She is trying to get patient into the [facility] wound care clinic.    Final Clinical Impressions(s) / ED Diagnoses   Final diagnoses:  Multiple open wounds of lower leg, left, initial encounter  Multiple open wounds of lower leg, right, initial encounter    ED Discharge Orders    None       Vanetta Mulders, MD 02/15/18 1527   Case management is made arrangements for follow-up for the patient with [facility] wound care center.  Patient given phone number and instructions.  Patient is left leg does have a little bit of redness here today we will continue her with an oral antibiotic.  She will follow-up with wound care.  Once again patient's labs were  normal patient nontoxic no acute distress.      Vanetta Mulders, MD 02/15/18 1640

## 2018-02-15 NOTE — ED Triage Notes (Signed)
Pt reports she had blisters to BLE 1 month ago and ruptured. Was seen by health department for this issue and told to keep her legs wrapped and use neosporin. Pt now presents with wounds areas to BLE, worse on L than right. Purulent and bloody drainage per pt.

## 2018-02-15 NOTE — Discharge Instructions (Signed)
Follow-up with [facility] wound care center.  In the meantime take the antibiotic as directed.  Take pain medicine as needed.  Continue to dress her wounds as you have in the past.  Return for any new or worse symptoms.

## 2018-02-15 NOTE — Care Management (Addendum)
CM has received consult for wound care. Pt receives PCP care through [facility]. Clinic will not sign HH orders. CM contacted [facility] who says health department does sign OP PT referrals. CM contacted [facility] who had sent pt to hospital today. They will sign OP PT orders for wound care. CM has sent referral. Discussed plan with pt at bedside. Also provided info on [facility] as they are an option for PCP care that will sign HH orders.    Addendum: After reconvening with EDP, he feels it would be best for pt to see wound MD at wound center. CM has contacted [facility] who will not see uninsured pt's. They directed CM to Dr. Fraser Din [phone]) who will see pt, they would have to pay OOP at the time of appointment, estimated prices given. CM has given prices to pt along with number to office. Pt plans to call and make appointment. CM will also attempt to get pt into [facility] as they may be able to offer more financial assistance, pt would have transportation to [place], pt aware it may time quite some time to get appointment in [place].

## 2018-02-17 ENCOUNTER — Telehealth (HOSPITAL_COMMUNITY): Payer: Self-pay

## 2018-02-17 NOTE — Telephone Encounter (Signed)
02/17/18  I called patient to see if she had heard from [facility] and she said she had not.  I messaged Kathyrn Sheriff, C. Manager to let her know and to see if I needed to schedule the patient here in our office.

## 2018-02-20 LAB — CULTURE, BLOOD (ROUTINE X 2)
Culture: NO GROWTH
Culture: NO GROWTH
SPECIAL REQUESTS: ADEQUATE
SPECIAL REQUESTS: ADEQUATE

## 2018-02-23 ENCOUNTER — Encounter (INDEPENDENT_AMBULATORY_CARE_PROVIDER_SITE_OTHER): Payer: Self-pay | Admitting: Internal Medicine

## 2018-02-23 ENCOUNTER — Ambulatory Visit (INDEPENDENT_AMBULATORY_CARE_PROVIDER_SITE_OTHER): Payer: Self-pay | Admitting: Internal Medicine

## 2018-02-23 VITALS — BP 90/60 | HR 60 | Temp 98.0°F | Ht 62.0 in | Wt 71.1 lb

## 2018-02-23 DIAGNOSIS — K311 Adult hypertrophic pyloric stenosis: Secondary | ICD-10-CM

## 2018-02-23 NOTE — Patient Instructions (Signed)
Continue the protonix.  OV in 4 months

## 2018-02-23 NOTE — Progress Notes (Signed)
Subjective:    Patient ID: Kristine Roberts, female    DOB: Jun 24, 1956, 62 y.o.        Here today for f/u. She has several year history of PUD who has undergone surgery in the past and now has developed recurrent disease with gastric outlet obstruction.    She is going to the wound center in [place]  3/27/2019for lower extremities open wounds ( open sores). She says it was from the Potassium she received while in the Hospital.  She has a cellulitis.    On 12/28/2017 she underwent an EGD for management dilation of anastomotic stricture. EGD revealed   Impression:               - Normal proximal esophagus and mid esophagus.                           - LA Grade D reflux esophagitis.                           - Z-line irregular, 34 cm from the incisors.                           - 2 cm hiatal hernia.                           - Billroth I gastroduodenostomy was found.                           - Non-bleeding gastric ulcers.                           - Gastric stenosis was found at the anastomosis.                            Dilated.                           - Normal second portion of the duodenum, third                            portion of the duodenum and fourth portion of the                            duodenum.       She had a repeat EGD 01/26/2018 which revealed.  Impression:               - Normal upper third of esophagus and middle third                            of esophagus.                           - LA Grade C reflux esophagitis.                           - Z-line irregular, 34 cm from the incisors.                           -  3 cm hiatal hernia.                           - Billroth I gastroduodenostomy was found,                            characterized by ulcer and highgrade stricture.                            Stricture traversed with ultra slim scope first and                            then with regular scope post dilation. Dilated to   13.5 mm with balloon dilator.                           - Normal bulb second portion of the duodenum.                           - No specimens collected. She tells me she doing good. Her appetite has improved. She has gained from 60 pounds on 12/2017 to 71.1lb. She is not taking any NSAIDS or Goody Powders. BMs are normal. No melena or BRRB.     Review of Systems Past Medical History:  Diagnosis Date  . COPD (chronic obstructive pulmonary disease) (HCC)   . Dysphagia   . Esophagitis   . Gastric outlet obstruction   . Gastric stenosis   . Hiatal hernia   . PUD (peptic ulcer disease)     Past Surgical History:  Procedure Laterality Date  . APPENDECTOMY    . BALLOON DILATION N/A 03/05/2016   Procedure: BALLOON DILATION;  Surgeon: Malissa Hippo, MD;  [facility];  Service: Endoscopy;  Laterality: N/A;  pyloric channel dialtaion  . CHOLECYSTECTOMY    . ESOPHAGEAL DILATION N/A 03/03/2016   Procedure: ESOPHAGEAL DILATION;  Surgeon: Malissa Hippo, MD;  [facility];  Service: Endoscopy;  Laterality: N/A;  . ESOPHAGEAL DILATION N/A 05/07/2016   Procedure: ESOPHAGEAL DILATION;  Surgeon: Malissa Hippo, MD;  [facility];  Service: Endoscopy;  Laterality: N/A;  . ESOPHAGEAL DILATION  01/26/2018   Procedure: DILATION OF ANASTOMOTIC  STRICTURE;  Surgeon: Malissa Hippo, MD;  [facility];  Service: Endoscopy;;  . ESOPHAGOGASTRODUODENOSCOPY N/A 03/03/2016   Procedure: ESOPHAGOGASTRODUODENOSCOPY (EGD);  Surgeon: Malissa Hippo, MD;  [facility];  Service: Endoscopy;  Laterality: N/A;  2:00  . ESOPHAGOGASTRODUODENOSCOPY N/A 05/07/2016   Procedure: ESOPHAGOGASTRODUODENOSCOPY (EGD);  Surgeon: Malissa Hippo, MD;  [facility];  Service: Endoscopy;  Laterality: N/A;  855 - moved to 6/2 @ 10:15 - Ann notified pt  . ESOPHAGOGASTRODUODENOSCOPY N/A 12/28/2017   Procedure: ESOPHAGOGASTRODUODENOSCOPY (EGD) with stricture dilation;   Surgeon: Malissa Hippo, MD;  [facility];  Service: Endoscopy;  Laterality: N/A;  . ESOPHAGOGASTRODUODENOSCOPY N/A 01/26/2018   Procedure: ESOPHAGOGASTRODUODENOSCOPY (EGD);  Surgeon: Malissa Hippo, MD;  [facility];  Service: Endoscopy;  Laterality: N/A;  255  . ESOPHAGOGASTRODUODENOSCOPY (EGD) WITH PROPOFOL N/A 03/05/2016   Procedure: ESOPHAGOGASTRODUODENOSCOPY (EGD) WITH PROPOFOL Anastomotic stricture dilation ;  Surgeon: Malissa Hippo, MD;  [facility];  Service: Endoscopy;  Laterality: N/A;  to be done in OR under fluoro  . STOMACH SURGERY      Allergies  Allergen Reactions  . Penicillins Itching, Swelling and Other (See Comments)    Tongue swells Has patient had a PCN reaction causing immediate rash, facial/tongue/throat swelling, SOB or lightheadedness with hypotension: Yes, had a rash and tongue swelled up. No SOB and lightheadedness. Has patient had a PCN reaction causing severe rash involving mucus membranes or skin necrosis: No Has patient had a PCN reaction that required hospitalization: No Has patient had a PCN reaction occurring within the last 10 years: No If all of the above answers are "NO", then may procee    Current Outpatient Medications on File Prior to Visit  Medication Sig Dispense Refill  . albuterol (PROVENTIL HFA;VENTOLIN HFA) 108 (90 Base) MCG/ACT inhaler Inhale 2 puffs into the lungs every 6 (six) hours as needed for wheezing or shortness of breath.     . calcium carbonate (TUMS - DOSED IN MG ELEMENTAL CALCIUM) 500 MG chewable tablet Chew 1 tablet (200 mg of elemental calcium total) by mouth 3 (three) times daily. 90 tablet 3  . cyclobenzaprine (FLEXERIL) 10 MG tablet Take 10 mg by mouth daily as needed for muscle spasms.   1  . doxycycline (VIBRAMYCIN) 100 MG capsule Take 1 capsule (100 mg total) by mouth 2 (two) times daily. 14 capsule 0  . DULERA 200-5 MCG/ACT AERO Inhale 1 puff into the lungs 2 (two) times daily.   6    . feeding supplement, ENSURE ENLIVE, (ENSURE ENLIVE) LIQD Take 237 mLs by mouth 3 (three) times daily between meals. 30 Bottle 12  . HYDROcodone-acetaminophen (NORCO/VICODIN) 5-325 MG tablet Take 1-2 tablets by mouth every 6 (six) hours as needed. 14 tablet 0  . Multiple Vitamin (MULTIVITAMIN WITH MINERALS) TABS tablet Take 1 tablet by mouth daily. 30 tablet 2  . neomycin-bacitracin-polymyxin (NEOSPORIN) ointment Apply 1 application topically as needed for wound care.    . pantoprazole (PROTONIX) 40 MG tablet Take 1 tablet (40 mg total) by mouth daily before breakfast. 30 tablet 5  . tetrahydrozoline 0.05 % ophthalmic solution Place 1 drop into both eyes daily as needed (dry eyes).    . traZODone (DESYREL) 100 MG tablet Take 1 tablet (100 mg total) by mouth at bedtime. 30 tablet 0   No current facility-administered medications on file prior to visit.         Objective:   Physical Exam Blood pressure 90/60, pulse 60, temperature 98 F (36.7 C), height 5\' 2"  (1.575 m), weight 71 lb 1.6 oz (32.3 kg). Alert and oriented. Skin warm and dry. Oral mucosa is moist.   . Sclera anicteric, conjunctivae is pink. Thyroid not enlarged. No cervical lymphadenopathy. Lungs clear. Heart regular rate and rhythm.  Abdomen is soft. Bowel sounds are positive. No hepatomegaly. No abdominal masses felt. No tenderness. She exhibits  edema to lower extremities.  Open draining wounds noted to both lower extremities.          Assessment & Plan:  Gastric outlet obstruction. : She has gained 11 pounds since January. No nausea or vomiting.  She will have OV in 4 months.

## 2018-03-01 ENCOUNTER — Encounter (HOSPITAL_BASED_OUTPATIENT_CLINIC_OR_DEPARTMENT_OTHER): Payer: Medicaid Other | Attending: Physician Assistant

## 2018-03-01 DIAGNOSIS — I872 Venous insufficiency (chronic) (peripheral): Secondary | ICD-10-CM | POA: Diagnosis not present

## 2018-03-01 DIAGNOSIS — L97822 Non-pressure chronic ulcer of other part of left lower leg with fat layer exposed: Secondary | ICD-10-CM | POA: Insufficient documentation

## 2018-03-01 DIAGNOSIS — R64 Cachexia: Secondary | ICD-10-CM | POA: Insufficient documentation

## 2018-03-01 DIAGNOSIS — F1010 Alcohol abuse, uncomplicated: Secondary | ICD-10-CM | POA: Insufficient documentation

## 2018-03-01 DIAGNOSIS — L97812 Non-pressure chronic ulcer of other part of right lower leg with fat layer exposed: Secondary | ICD-10-CM | POA: Insufficient documentation

## 2018-03-01 DIAGNOSIS — F17210 Nicotine dependence, cigarettes, uncomplicated: Secondary | ICD-10-CM | POA: Insufficient documentation

## 2018-03-01 DIAGNOSIS — I959 Hypotension, unspecified: Secondary | ICD-10-CM | POA: Insufficient documentation

## 2018-03-01 DIAGNOSIS — J449 Chronic obstructive pulmonary disease, unspecified: Secondary | ICD-10-CM | POA: Diagnosis not present

## 2018-03-01 DIAGNOSIS — D649 Anemia, unspecified: Secondary | ICD-10-CM | POA: Diagnosis not present

## 2018-03-08 ENCOUNTER — Encounter (HOSPITAL_BASED_OUTPATIENT_CLINIC_OR_DEPARTMENT_OTHER): Payer: Medicaid Other | Attending: Physician Assistant

## 2018-03-08 DIAGNOSIS — I872 Venous insufficiency (chronic) (peripheral): Secondary | ICD-10-CM | POA: Insufficient documentation

## 2018-03-08 DIAGNOSIS — L97812 Non-pressure chronic ulcer of other part of right lower leg with fat layer exposed: Secondary | ICD-10-CM | POA: Diagnosis present

## 2018-03-08 DIAGNOSIS — L97822 Non-pressure chronic ulcer of other part of left lower leg with fat layer exposed: Secondary | ICD-10-CM | POA: Diagnosis not present

## 2018-03-08 DIAGNOSIS — L97222 Non-pressure chronic ulcer of left calf with fat layer exposed: Secondary | ICD-10-CM | POA: Diagnosis not present

## 2018-03-08 DIAGNOSIS — F17210 Nicotine dependence, cigarettes, uncomplicated: Secondary | ICD-10-CM | POA: Insufficient documentation

## 2018-03-08 DIAGNOSIS — J449 Chronic obstructive pulmonary disease, unspecified: Secondary | ICD-10-CM | POA: Diagnosis not present

## 2018-03-15 DIAGNOSIS — L97812 Non-pressure chronic ulcer of other part of right lower leg with fat layer exposed: Secondary | ICD-10-CM | POA: Diagnosis not present

## 2018-03-29 DIAGNOSIS — L97812 Non-pressure chronic ulcer of other part of right lower leg with fat layer exposed: Secondary | ICD-10-CM | POA: Diagnosis not present

## 2018-03-29 MED FILL — CELECOXIB 100 MG CAPSULE: 100 | 30 days supply | Qty: 30 | Fill #0

## 2018-04-12 ENCOUNTER — Encounter (HOSPITAL_BASED_OUTPATIENT_CLINIC_OR_DEPARTMENT_OTHER): Payer: Medicaid Other | Attending: Physician Assistant

## 2018-04-12 DIAGNOSIS — F1010 Alcohol abuse, uncomplicated: Secondary | ICD-10-CM | POA: Diagnosis not present

## 2018-04-12 DIAGNOSIS — I872 Venous insufficiency (chronic) (peripheral): Secondary | ICD-10-CM | POA: Insufficient documentation

## 2018-04-12 DIAGNOSIS — F17200 Nicotine dependence, unspecified, uncomplicated: Secondary | ICD-10-CM | POA: Diagnosis not present

## 2018-04-12 DIAGNOSIS — D649 Anemia, unspecified: Secondary | ICD-10-CM | POA: Diagnosis not present

## 2018-04-12 DIAGNOSIS — L97822 Non-pressure chronic ulcer of other part of left lower leg with fat layer exposed: Secondary | ICD-10-CM | POA: Diagnosis present

## 2018-04-12 DIAGNOSIS — J449 Chronic obstructive pulmonary disease, unspecified: Secondary | ICD-10-CM | POA: Diagnosis not present

## 2018-04-12 DIAGNOSIS — R64 Cachexia: Secondary | ICD-10-CM | POA: Diagnosis not present

## 2018-04-12 DIAGNOSIS — L97812 Non-pressure chronic ulcer of other part of right lower leg with fat layer exposed: Secondary | ICD-10-CM | POA: Insufficient documentation

## 2018-04-12 DIAGNOSIS — I959 Hypotension, unspecified: Secondary | ICD-10-CM | POA: Diagnosis not present

## 2018-04-25 MED FILL — CELECOXIB 100 MG CAPSULE: 100 | 30 days supply | Qty: 30 | Fill #1

## 2018-04-26 DIAGNOSIS — L97822 Non-pressure chronic ulcer of other part of left lower leg with fat layer exposed: Secondary | ICD-10-CM | POA: Diagnosis not present

## 2018-05-17 ENCOUNTER — Encounter (HOSPITAL_BASED_OUTPATIENT_CLINIC_OR_DEPARTMENT_OTHER): Payer: Medicaid Other | Attending: Physician Assistant

## 2018-05-17 DIAGNOSIS — L97812 Non-pressure chronic ulcer of other part of right lower leg with fat layer exposed: Secondary | ICD-10-CM | POA: Insufficient documentation

## 2018-05-17 DIAGNOSIS — I959 Hypotension, unspecified: Secondary | ICD-10-CM | POA: Diagnosis not present

## 2018-05-17 DIAGNOSIS — J449 Chronic obstructive pulmonary disease, unspecified: Secondary | ICD-10-CM | POA: Insufficient documentation

## 2018-05-17 DIAGNOSIS — D649 Anemia, unspecified: Secondary | ICD-10-CM | POA: Diagnosis not present

## 2018-05-17 DIAGNOSIS — F1010 Alcohol abuse, uncomplicated: Secondary | ICD-10-CM | POA: Insufficient documentation

## 2018-05-17 DIAGNOSIS — Z681 Body mass index (BMI) 19 or less, adult: Secondary | ICD-10-CM | POA: Insufficient documentation

## 2018-05-17 DIAGNOSIS — I872 Venous insufficiency (chronic) (peripheral): Secondary | ICD-10-CM | POA: Diagnosis not present

## 2018-05-17 DIAGNOSIS — R64 Cachexia: Secondary | ICD-10-CM | POA: Diagnosis not present

## 2018-05-17 DIAGNOSIS — F17210 Nicotine dependence, cigarettes, uncomplicated: Secondary | ICD-10-CM | POA: Diagnosis not present

## 2018-05-17 DIAGNOSIS — L97822 Non-pressure chronic ulcer of other part of left lower leg with fat layer exposed: Secondary | ICD-10-CM | POA: Diagnosis present

## 2018-05-22 MED FILL — CELECOXIB 100 MG CAPSULE: 100 | 30 days supply | Qty: 30 | Fill #2

## 2018-05-29 ENCOUNTER — Telehealth (INDEPENDENT_AMBULATORY_CARE_PROVIDER_SITE_OTHER): Payer: Self-pay | Admitting: *Deleted

## 2018-05-29 ENCOUNTER — Encounter (INDEPENDENT_AMBULATORY_CARE_PROVIDER_SITE_OTHER): Payer: Self-pay | Admitting: Internal Medicine

## 2018-05-29 ENCOUNTER — Ambulatory Visit (INDEPENDENT_AMBULATORY_CARE_PROVIDER_SITE_OTHER): Payer: Self-pay | Admitting: Internal Medicine

## 2018-05-29 NOTE — Telephone Encounter (Signed)
Called patient because she missed appt this morning.  She said she was doing good and didn't need to come in at this time.  She will contact us when she needs to and is appreciative for everyone here.

## 2018-05-31 DIAGNOSIS — L97822 Non-pressure chronic ulcer of other part of left lower leg with fat layer exposed: Secondary | ICD-10-CM | POA: Diagnosis not present

## 2018-06-13 MED FILL — SANTYL OINTMENT: 250 | 30 days supply | Qty: 540 | Fill #0

## 2018-06-13 MED FILL — CELECOXIB 100 MG CAPSULE: 100 | 30 days supply | Qty: 30 | Fill #0

## 2018-06-14 ENCOUNTER — Encounter (HOSPITAL_BASED_OUTPATIENT_CLINIC_OR_DEPARTMENT_OTHER): Payer: Medicaid Other | Attending: Physician Assistant

## 2018-06-14 ENCOUNTER — Other Ambulatory Visit (HOSPITAL_COMMUNITY)
Admission: RE | Admit: 2018-06-14 | Discharge: 2018-06-14 | Disposition: A | Discharge status: Home / Self Care | Payer: Medicaid Other | Source: Other Acute Inpatient Hospital | Attending: Physician Assistant | Admitting: Physician Assistant

## 2018-06-14 DIAGNOSIS — L97822 Non-pressure chronic ulcer of other part of left lower leg with fat layer exposed: Secondary | ICD-10-CM | POA: Insufficient documentation

## 2018-06-14 DIAGNOSIS — J449 Chronic obstructive pulmonary disease, unspecified: Secondary | ICD-10-CM | POA: Diagnosis not present

## 2018-06-14 DIAGNOSIS — L97222 Non-pressure chronic ulcer of left calf with fat layer exposed: Secondary | ICD-10-CM | POA: Diagnosis not present

## 2018-06-14 DIAGNOSIS — F17210 Nicotine dependence, cigarettes, uncomplicated: Secondary | ICD-10-CM | POA: Insufficient documentation

## 2018-06-14 DIAGNOSIS — L97812 Non-pressure chronic ulcer of other part of right lower leg with fat layer exposed: Secondary | ICD-10-CM | POA: Insufficient documentation

## 2018-06-14 DIAGNOSIS — I872 Venous insufficiency (chronic) (peripheral): Secondary | ICD-10-CM | POA: Insufficient documentation

## 2018-06-14 MED FILL — SULFAMETHOXAZOLE-TMP DS TAB: 800-160 | 10 days supply | Qty: 20 | Fill #0

## 2018-06-17 LAB — AEROBIC CULTURE  (SUPERFICIAL SPECIMEN)

## 2018-06-17 LAB — AEROBIC CULTURE W GRAM STAIN (SUPERFICIAL SPECIMEN)

## 2018-06-21 DIAGNOSIS — L97812 Non-pressure chronic ulcer of other part of right lower leg with fat layer exposed: Secondary | ICD-10-CM | POA: Diagnosis not present

## 2018-07-08 ENCOUNTER — Emergency Department (HOSPITAL_COMMUNITY): Payer: Medicaid Other

## 2018-07-08 ENCOUNTER — Other Ambulatory Visit: Payer: Self-pay

## 2018-07-08 ENCOUNTER — Inpatient Hospital Stay (HOSPITAL_COMMUNITY)
Admission: EM | Admit: 2018-07-08 | Discharge: 2018-07-16 | DRG: 480 | Disposition: A | Discharge status: Home Health | Payer: Medicaid Other | Attending: Family Medicine | Admitting: Family Medicine

## 2018-07-08 ENCOUNTER — Encounter (HOSPITAL_COMMUNITY): Payer: Self-pay | Admitting: Emergency Medicine

## 2018-07-08 DIAGNOSIS — M25551 Pain in right hip: Secondary | ICD-10-CM | POA: Diagnosis present

## 2018-07-08 DIAGNOSIS — J449 Chronic obstructive pulmonary disease, unspecified: Secondary | ICD-10-CM | POA: Diagnosis present

## 2018-07-08 DIAGNOSIS — L03115 Cellulitis of right lower limb: Secondary | ICD-10-CM | POA: Diagnosis present

## 2018-07-08 DIAGNOSIS — D62 Acute posthemorrhagic anemia: Secondary | ICD-10-CM | POA: Diagnosis not present

## 2018-07-08 DIAGNOSIS — Y92009 Unspecified place in unspecified non-institutional (private) residence as the place of occurrence of the external cause: Secondary | ICD-10-CM

## 2018-07-08 DIAGNOSIS — K210 Gastro-esophageal reflux disease with esophagitis: Secondary | ICD-10-CM | POA: Diagnosis present

## 2018-07-08 DIAGNOSIS — Z880 Allergy status to penicillin: Secondary | ICD-10-CM | POA: Diagnosis not present

## 2018-07-08 DIAGNOSIS — R112 Nausea with vomiting, unspecified: Secondary | ICD-10-CM | POA: Diagnosis not present

## 2018-07-08 DIAGNOSIS — S72001A Fracture of unspecified part of neck of right femur, initial encounter for closed fracture: Secondary | ICD-10-CM | POA: Diagnosis not present

## 2018-07-08 DIAGNOSIS — J439 Emphysema, unspecified: Secondary | ICD-10-CM | POA: Diagnosis not present

## 2018-07-08 DIAGNOSIS — S72141A Displaced intertrochanteric fracture of right femur, initial encounter for closed fracture: Secondary | ICD-10-CM | POA: Diagnosis present

## 2018-07-08 DIAGNOSIS — K219 Gastro-esophageal reflux disease without esophagitis: Secondary | ICD-10-CM | POA: Diagnosis present

## 2018-07-08 DIAGNOSIS — F329 Major depressive disorder, single episode, unspecified: Secondary | ICD-10-CM | POA: Diagnosis present

## 2018-07-08 DIAGNOSIS — E43 Unspecified severe protein-calorie malnutrition: Secondary | ICD-10-CM | POA: Diagnosis present

## 2018-07-08 DIAGNOSIS — N281 Cyst of kidney, acquired: Secondary | ICD-10-CM | POA: Diagnosis present

## 2018-07-08 DIAGNOSIS — K3184 Gastroparesis: Secondary | ICD-10-CM | POA: Diagnosis present

## 2018-07-08 DIAGNOSIS — K3189 Other diseases of stomach and duodenum: Secondary | ICD-10-CM | POA: Diagnosis present

## 2018-07-08 DIAGNOSIS — Z79899 Other long term (current) drug therapy: Secondary | ICD-10-CM

## 2018-07-08 DIAGNOSIS — L03116 Cellulitis of left lower limb: Secondary | ICD-10-CM | POA: Diagnosis present

## 2018-07-08 DIAGNOSIS — S72001D Fracture of unspecified part of neck of right femur, subsequent encounter for closed fracture with routine healing: Secondary | ICD-10-CM | POA: Diagnosis not present

## 2018-07-08 DIAGNOSIS — F17210 Nicotine dependence, cigarettes, uncomplicated: Secondary | ICD-10-CM | POA: Diagnosis present

## 2018-07-08 DIAGNOSIS — K449 Diaphragmatic hernia without obstruction or gangrene: Secondary | ICD-10-CM | POA: Diagnosis present

## 2018-07-08 DIAGNOSIS — W109XXA Fall (on) (from) unspecified stairs and steps, initial encounter: Secondary | ICD-10-CM | POA: Diagnosis present

## 2018-07-08 DIAGNOSIS — Z419 Encounter for procedure for purposes other than remedying health state, unspecified: Secondary | ICD-10-CM

## 2018-07-08 DIAGNOSIS — Z681 Body mass index (BMI) 19 or less, adult: Secondary | ICD-10-CM | POA: Diagnosis not present

## 2018-07-08 DIAGNOSIS — E876 Hypokalemia: Secondary | ICD-10-CM | POA: Diagnosis present

## 2018-07-08 DIAGNOSIS — Z8711 Personal history of peptic ulcer disease: Secondary | ICD-10-CM

## 2018-07-08 HISTORY — DX: Unspecified multiple injuries, initial encounter: T07.XXXA

## 2018-07-08 HISTORY — DX: Gastro-esophageal reflux disease without esophagitis: K21.9

## 2018-07-08 LAB — PROTIME-INR
INR: 0.95
Prothrombin Time: 12.6 seconds (ref 11.4–15.2)

## 2018-07-08 LAB — BASIC METABOLIC PANEL
ANION GAP: 8 (ref 5–15)
BUN: 13 mg/dL (ref 8–23)
CHLORIDE: 104 mmol/L (ref 98–111)
CO2: 24 mmol/L (ref 22–32)
Calcium: 8.5 mg/dL — ABNORMAL LOW (ref 8.9–10.3)
Creatinine, Ser: 0.72 mg/dL (ref 0.44–1.00)
GFR calc non Af Amer: >60 mL/min (ref >60)
GLUCOSE: 89 mg/dL (ref 70–99)
POTASSIUM: 3 mmol/L — AB (ref 3.5–5.1)
Sodium: 136 mmol/L (ref 135–145)

## 2018-07-08 LAB — CBC WITH DIFFERENTIAL/PLATELET
Basophils Absolute: 0.1 10*3/uL (ref 0.0–0.1)
Basophils Relative: 1 %
Eosinophils Absolute: 0.1 10*3/uL (ref 0.0–0.7)
Eosinophils Relative: 1 %
HEMATOCRIT: 30.6 % — AB (ref 36.0–46.0)
HEMOGLOBIN: 9.2 g/dL — AB (ref 12.0–15.0)
LYMPHS PCT: 8 %
Lymphs Abs: 0.8 10*3/uL (ref 0.7–4.0)
MCH: 26.2 pg (ref 26.0–34.0)
MCHC: 30.1 g/dL (ref 30.0–36.0)
MCV: 87.2 fL (ref 78.0–100.0)
MONO ABS: 0.7 10*3/uL (ref 0.1–1.0)
MONOS PCT: 6 %
NEUTROS ABS: 9 10*3/uL — AB (ref 1.7–7.7)
NEUTROS PCT: 84 %
Platelets: 349 10*3/uL (ref 150–400)
RBC: 3.51 MIL/uL — ABNORMAL LOW (ref 3.87–5.11)
RDW: 18.1 % — AB (ref 11.5–15.5)
WBC: 10.6 10*3/uL — ABNORMAL HIGH (ref 4.0–10.5)

## 2018-07-08 LAB — MAGNESIUM: MAGNESIUM: 2.1 mg/dL (ref 1.7–2.4)

## 2018-07-08 LAB — MRSA PCR SCREENING: MRSA BY PCR: NEGATIVE

## 2018-07-08 MED ORDER — CEFAZOLIN SODIUM-DEXTROSE 2-4 GM/100ML-% IV SOLN
2.0000 g | Freq: Three times a day (TID) | INTRAVENOUS | Status: DC
  Administered 2018-07-08 – 2018-07-14 (×16): 2 g via INTRAVENOUS
  Filled 2018-07-08 (×23): qty 100

## 2018-07-08 MED ORDER — POTASSIUM CHLORIDE CRYS ER 20 MEQ PO TBCR: 40.0000 meq | EXTENDED_RELEASE_TABLET | Freq: Two times a day (BID) | ORAL | Status: DC

## 2018-07-08 MED ORDER — ONDANSETRON HCL 4 MG/2ML IJ SOLN: 4.0000 mg | Freq: Four times a day (QID) | INTRAMUSCULAR | Status: DC | PRN

## 2018-07-08 MED ORDER — FENTANYL CITRATE (PF) 100 MCG/2ML IJ SOLN
50.0000 ug | Freq: Once | INTRAMUSCULAR | Status: AC
  Administered 2018-07-08: 50 ug via INTRAVENOUS
  Filled 2018-07-08: qty 2

## 2018-07-08 MED ORDER — MOMETASONE FURO-FORMOTEROL FUM 200-5 MCG/ACT IN AERO
1.0000 | INHALATION_SPRAY | Freq: Two times a day (BID) | RESPIRATORY_TRACT | Status: DC
  Administered 2018-07-09 – 2018-07-16 (×15): 1 via RESPIRATORY_TRACT
  Filled 2018-07-08: qty 8.8

## 2018-07-08 MED ORDER — NAPHAZOLINE-GLYCERIN 0.012-0.2 % OP SOLN: 1.0000 [drp] | Freq: Four times a day (QID) | OPHTHALMIC | Status: DC | PRN

## 2018-07-08 MED ORDER — ACETAMINOPHEN 650 MG RE SUPP: 650.0000 mg | Freq: Four times a day (QID) | RECTAL | Status: DC | PRN

## 2018-07-08 MED ORDER — HYDROMORPHONE HCL 1 MG/ML IJ SOLN
0.5000 mg | Freq: Once | INTRAMUSCULAR | Status: AC
  Administered 2018-07-08: 0.5 mg via INTRAVENOUS
  Filled 2018-07-08: qty 1

## 2018-07-08 MED ORDER — POTASSIUM CHLORIDE IN NACL 40-0.9 MEQ/L-% IV SOLN
INTRAVENOUS | Status: DC
  Administered 2018-07-09 – 2018-07-10 (×5): 100 mL/h via INTRAVENOUS
  Filled 2018-07-08 (×6): qty 1000

## 2018-07-08 MED ORDER — ONDANSETRON HCL 4 MG PO TABS: 4.0000 mg | ORAL_TABLET | Freq: Four times a day (QID) | ORAL | Status: DC | PRN

## 2018-07-08 MED ORDER — HYDROMORPHONE HCL 1 MG/ML IJ SOLN
0.5000 mg | INTRAMUSCULAR | Status: DC | PRN
  Administered 2018-07-08 – 2018-07-10 (×10): 0.5 mg via INTRAVENOUS
  Filled 2018-07-08 (×10): qty 1

## 2018-07-08 MED ORDER — TRAZODONE HCL 100 MG PO TABS
100.0000 mg | ORAL_TABLET | Freq: Every day | ORAL | Status: DC
  Administered 2018-07-09 – 2018-07-15 (×8): 100 mg via ORAL
  Filled 2018-07-08 (×8): qty 1

## 2018-07-08 MED ORDER — POTASSIUM CHLORIDE CRYS ER 20 MEQ PO TBCR
40.0000 meq | EXTENDED_RELEASE_TABLET | Freq: Two times a day (BID) | ORAL | Status: DC
  Administered 2018-07-08 – 2018-07-15 (×7): 40 meq via ORAL
  Filled 2018-07-08 (×11): qty 2

## 2018-07-08 MED ORDER — ALBUTEROL SULFATE (2.5 MG/3ML) 0.083% IN NEBU: 2.5000 mg | INHALATION_SOLUTION | Freq: Four times a day (QID) | RESPIRATORY_TRACT | Status: DC | PRN

## 2018-07-08 MED ORDER — ENSURE ENLIVE PO LIQD
237.0000 mL | Freq: Three times a day (TID) | ORAL | Status: DC
  Administered 2018-07-09 – 2018-07-16 (×10): 237 mL via ORAL

## 2018-07-08 MED ORDER — ACETAMINOPHEN 325 MG PO TABS
650.0000 mg | ORAL_TABLET | Freq: Four times a day (QID) | ORAL | Status: DC | PRN
  Administered 2018-07-10 – 2018-07-15 (×2): 650 mg via ORAL
  Filled 2018-07-08 (×2): qty 2

## 2018-07-08 MED ORDER — BACITRACIN-NEOMYCIN-POLYMYXIN 400-5-5000 EX OINT: 1.0000 "application " | TOPICAL_OINTMENT | CUTANEOUS | Status: DC | PRN

## 2018-07-08 MED ORDER — CITALOPRAM HYDROBROMIDE 20 MG PO TABS
20.0000 mg | ORAL_TABLET | Freq: Every day | ORAL | Status: DC
  Administered 2018-07-09 – 2018-07-16 (×8): 20 mg via ORAL
  Filled 2018-07-08 (×8): qty 1

## 2018-07-08 NOTE — ED Notes (Signed)
Pt reports falling down three steps. Endorses RT hip pain. Difficulty to assess deformity due to pain with movement. Pt unable to straighten leg. Pedal pulse strong, cap refill brisk. Denies hitting head, syncope, or LOC.

## 2018-07-08 NOTE — ED Notes (Signed)
Patient transported to CT 

## 2018-07-08 NOTE — ED Notes (Signed)
Patient transported to X-ray 

## 2018-07-08 NOTE — ED Notes (Signed)
Pt returned from xray

## 2018-07-08 NOTE — Progress Notes (Signed)
I have asked over the phone with Dr. Charm Barges in the [facility] emergency department this case.  The patient is indicated for operative management of her right hip fracture.  Therefore, she will need to be transferred down to [facility] for that care.  I have recommended a CT scan of the right hip while in the [facility] emergency department to help facilitate her care.  We will plan for surgery tomorrow.  Please have her n.p.o. tonight at midnight.

## 2018-07-08 NOTE — H&P (Addendum)
History and Physical  Kristine Roberts BLT:[id number] DOB: 23-Sep-1956 DOA: 07/08/2018  Referring physician: Dr Charm Barges, ED physician PCP: [facility]  Outpatient Specialists:   Patient Coming From: home  Chief Complaint: right hip pain  HPI: Kristine Roberts is a 62 y.o. female with a history of COPD, malnutrition, osteopenia, PUD. Patient presents after mechanical fall at home. She was trying to avoid her cat, which was on the steps. A misstep caused her to fall, landing on her buttocks and right hip. She immediately had right hip pain. Not able to bear weight. No radiation to pain. Pain is sharp and severe. Worse with movement and improved with IV narcotics. No other palliating or provoking symptoms.   Emergency Department Course: Xray shows right impaction fracture involving lesser trochanter. Potassium 3.0.  Review of Systems:   Pt denies any fevers, chills, nausea, vomiting, diarrhea, constipation, abdominal pain, shortness of breath, dyspnea on exertion, orthopnea, cough, wheezing, palpitations, headache, vision changes, lightheadedness, dizziness, melena, rectal bleeding.  Review of systems are otherwise negative  Past Medical History:  Diagnosis Date  . COPD (chronic obstructive pulmonary disease) (HCC)   . Dysphagia   . Esophagitis   . Gastric outlet obstruction   . Gastric stenosis   . Hiatal hernia   . PUD (peptic ulcer disease)   . Wounds, multiple    Past Surgical History:  Procedure Laterality Date  . APPENDECTOMY    . BALLOON DILATION N/A 03/05/2016   Procedure: BALLOON DILATION;  Surgeon: Malissa Hippo, MD;  [facility]: [facility];  Service: Endoscopy;  Laterality: N/A;  pyloric channel dialtaion  . CHOLECYSTECTOMY    . ESOPHAGEAL DILATION N/A 03/03/2016   Procedure: ESOPHAGEAL DILATION;  Surgeon: Malissa Hippo, MD;  [facility]: [facility];  Service: Endoscopy;  Laterality: N/A;  . ESOPHAGEAL DILATION N/A 05/07/2016   Procedure: ESOPHAGEAL  DILATION;  Surgeon: Malissa Hippo, MD;  [facility];  Service: Endoscopy;  Laterality: N/A;  . ESOPHAGEAL DILATION  01/26/2018   Procedure: DILATION OF ANASTOMOTIC  STRICTURE;  Surgeon: Malissa Hippo, MD;  [facility]: [facility];  Service: Endoscopy;;  . ESOPHAGOGASTRODUODENOSCOPY N/A 03/03/2016   Procedure: ESOPHAGOGASTRODUODENOSCOPY (EGD);  Surgeon: Malissa Hippo, MD;  [facility];  Service: Endoscopy;  Laterality: N/A;  2:00  . ESOPHAGOGASTRODUODENOSCOPY N/A 05/07/2016   Procedure: ESOPHAGOGASTRODUODENOSCOPY (EGD);  Surgeon: Malissa Hippo, MD;  [facility];  Service: Endoscopy;  Laterality: N/A;  855 - moved to 6/2 @ 10:15 - Ann notified pt  . ESOPHAGOGASTRODUODENOSCOPY N/A 12/28/2017   Procedure: ESOPHAGOGASTRODUODENOSCOPY (EGD) with stricture dilation;  Surgeon: Malissa Hippo, MD;  [facility];  Service: Endoscopy;  Laterality: N/A;  . ESOPHAGOGASTRODUODENOSCOPY N/A 01/26/2018   Procedure: ESOPHAGOGASTRODUODENOSCOPY (EGD);  Surgeon: Malissa Hippo, MD;  [facility];  Service: Endoscopy;  Laterality: N/A;  255  . ESOPHAGOGASTRODUODENOSCOPY (EGD) WITH PROPOFOL N/A 03/05/2016   Procedure: ESOPHAGOGASTRODUODENOSCOPY (EGD) WITH PROPOFOL Anastomotic stricture dilation ;  Surgeon: Malissa Hippo, MD;  [facility];  Service: Endoscopy;  Laterality: N/A;  to be done in OR under fluoro  . STOMACH SURGERY     Social History:  reports that she has been smoking cigarettes.  She has a 20.00 pack-year smoking history. She has never used smokeless tobacco. She reports that she drinks about 7.2 oz of alcohol per week. She reports that she does not use drugs. Patient lives at home  Allergies  Allergen Reactions  .  Penicillins Itching, Swelling and Other (See Comments)    Tongue swells Has patient had a PCN reaction causing immediate rash, facial/tongue/throat swelling, SOB or lightheadedness with hypotension: Yes, had a rash  and tongue swelled up. No SOB and lightheadedness. Has patient had a PCN reaction causing severe rash involving mucus membranes or skin necrosis: No Has patient had a PCN reaction that required hospitalization: No Has patient had a PCN reaction occurring within the last 10 years: No If all of the above answers are "NO", then may procee    Family History  Problem Relation Age of Onset  . Alzheimer's disease Mother   . COPD Mother   . Throat cancer Father   . Breast cancer Sister      Prior to Admission medications   Medication Sig Start Date End Date Taking? Authorizing Provider  albuterol (PROVENTIL HFA;VENTOLIN HFA) 108 (90 Base) MCG/ACT inhaler Inhale 2 puffs into the lungs every 6 (six) hours as needed for wheezing or shortness of breath.    Yes [provider]  celecoxib (CELEBREX) 100 MG capsule Take 100 mg by mouth daily.  06/13/18  Yes [provider]  citalopram (CELEXA) 20 MG tablet Take 20 mg by mouth daily. 06/23/18  Yes [provider]  DULERA 200-5 MCG/ACT AERO Inhale 1 puff into the lungs 2 (two) times daily.  12/22/17  Yes [provider]  feeding supplement, ENSURE ENLIVE, (ENSURE ENLIVE) LIQD Take 237 mLs by mouth 3 (three) times daily between meals. 12/30/17  Yes Shon Hale, MD  Multiple Vitamin (MULTIVITAMIN WITH MINERALS) TABS tablet Take 1 tablet by mouth daily. 12/31/17  Yes Emokpae, Courage, MD  neomycin-bacitracin-polymyxin (NEOSPORIN) ointment Apply 1 application topically as needed for wound care.   Yes [provider]  SANTYL ointment Apply 1 application topically daily.  06/13/18  Yes [provider]  tetrahydrozoline 0.05 % ophthalmic solution Place 1 drop into both eyes daily as needed (dry eyes).   Yes [provider]  traZODone (DESYREL) 100 MG tablet Take 1 tablet (100 mg total) by mouth at bedtime. 12/30/17  Yes Emokpae, Courage, MD  pantoprazole (PROTONIX) 40 MG tablet Take 1 tablet (40 mg total)  by mouth daily before breakfast. Patient not taking: Reported on 07/08/2018 01/26/18   Malissa Hippo, MD  sulfamethoxazole-trimethoprim (BACTRIM DS,SEPTRA DS) 800-160 MG tablet Take 1 tablet by mouth 2 (two) times daily. for 10 days starting on 06/21/2018 06/21/18   [provider]    Physical Exam: BP (!) 94/59   Pulse 71   Temp 99.4 F (37.4 C) (Oral)   Resp 11   Ht 5\' 2"  (1.575 m)   Wt 31.8 kg (70 lb)   SpO2 100%   BMI 12.80 kg/m   . General: Elderly female. Awake and alert and oriented x3. No acute cardiopulmonary distress.  HEENT: Normocephalic atraumatic.  Right and left ears normal in appearance.  Pupils equal, round, reactive to light. Extraocular muscles are intact. Sclerae anicteric and noninjected.  Moist mucosal membranes. No mucosal lesions.  . Neck: Neck supple without lymphadenopathy. No carotid bruits. No masses palpated.  . Cardiovascular: Regular rate with normal S1-S2 sounds. No murmurs, rubs, gallops auscultated. No JVD.  Marland Kitchen Respiratory: Good respiratory effort with no wheezes, rales, rhonchi. Lungs clear to auscultation bilaterally.  No accessory muscle use. . Abdomen: Soft, nontender, nondistended. Active bowel sounds. No masses or hepatosplenomegaly  . Skin: Several ulcerations of lower extremeties, about 3.5cm in diameter. Erythema surrounding the ulcerations extending 3  cm from ulceration edges. No rashes, lesions, or ulcerations.  Dry, warm to touch. 2+ dorsalis pedis and radial pulses. . Musculoskeletal: No calf or leg pain. Right leg shortened and externally rotated. Pt won't straighten leg.  No contractures  . Psychiatric: Intact judgment and insight. Pleasant and cooperative. . Neurologic: No focal neurological deficits. Strength is 5/5 and symmetric in upper and lower extremities.  Cranial nerves II through XII are grossly intact.           Labs on Admission: I have personally reviewed following labs and imaging studies  CBC: Recent Labs  Lab  07/08/18 1710  WBC 10.6*  NEUTROABS 9.0*  HGB 9.2*  HCT 30.6*  MCV 87.2  PLT 349   Basic Metabolic Panel: Recent Labs  Lab 07/08/18 1710  NA 136  K 3.0*  CL 104  CO2 24  GLUCOSE 89  BUN 13  CREATININE 0.72  CALCIUM 8.5*   GFR: Estimated Creatinine Clearance: 36.6 mL/min (by C-G formula based on SCr of 0.72 mg/dL). Liver Function Tests: No results for input(s): AST, ALT, ALKPHOS, BILITOT, PROT, ALBUMIN in the last 168 hours. No results for input(s): LIPASE, AMYLASE in the last 168 hours. No results for input(s): AMMONIA in the last 168 hours. Coagulation Profile: Recent Labs  Lab 07/08/18 1710  INR 0.95   Cardiac Enzymes: No results for input(s): CKTOTAL, CKMB, CKMBINDEX, TROPONINI in the last 168 hours. BNP (last 3 results) No results for input(s): PROBNP in the last 8760 hours. HbA1C: No results for input(s): HGBA1C in the last 72 hours. CBG: No results for input(s): GLUCAP in the last 168 hours. Lipid Profile: No results for input(s): CHOL, HDL, LDLCALC, TRIG, CHOLHDL, LDLDIRECT in the last 72 hours. Thyroid Function Tests: No results for input(s): TSH, T4TOTAL, FREET4, T3FREE, THYROIDAB in the last 72 hours. Anemia Panel: No results for input(s): VITAMINB12, FOLATE, FERRITIN, TIBC, IRON, RETICCTPCT in the last 72 hours. Urine analysis:    Component Value Date/Time   COLORURINE YELLOW 12/26/2017 1006   APPEARANCEUR CLEAR 12/26/2017 1006   LABSPEC 1.026 12/26/2017 1006   PHURINE 5.0 12/26/2017 1006   GLUCOSEU NEGATIVE 12/26/2017 1006   HGBUR NEGATIVE 12/26/2017 1006   BILIRUBINUR NEGATIVE 12/26/2017 1006   KETONESUR 5 (A) 12/26/2017 1006   PROTEINUR NEGATIVE 12/26/2017 1006   NITRITE NEGATIVE 12/26/2017 1006   LEUKOCYTESUR NEGATIVE 12/26/2017 1006   Sepsis Labs: @LABRCNTIP (procalcitonin:4,lacticidven:4) )No results found for this or any previous visit (from the past 240 hour(s)).   Radiological Exams on Admission: Dg Hip Unilat With Pelvis 2-3  Views Right  Result Date: 07/08/2018 CLINICAL DATA:  Fall today outside, dodging a cat. Unable to straighten RIGHT leg. RIGHT hip pain. EXAM: DG HIP (WITH OR WITHOUT PELVIS) 2-3V RIGHT COMPARISON:  None. FINDINGS: The RIGHT hip appears foreshortened, accentuated by patient positioning. There is a linear lucency traversing the base of the femoral neck and mild irregularity of the lesser trochanter. Findings are consistent with impacted basicervical/intertrochanteric fracture. There is no dislocation. The LEFT hip appears intact. Bones appear radiolucent. IMPRESSION: Impacted fracture of the RIGHT femoral neck, possibly involving the lesser trochanter. Greater trochanter involvement cannot be excluded. Consider CT if needed for further characterization. Electronically Signed   By: Norva Pavlov M.D.   On: 07/08/2018 17:10    EKG: Independently reviewed. Sinus rhythm. QT normal. RBBB. No ST changes.  Assessment/Plan: Principal Problem:   Closed right hip fracture (HCC) Active Problems:   COPD (chronic obstructive pulmonary disease) (HCC)   Protein-calorie malnutrition, severe  Hypokalemia   Cellulitis of both lower extremities    This patient was discussed with the ED physician, including pertinent vitals, physical exam findings, labs, and imaging.  We also discussed care given by the ED provider.  1. Right closed hip fracture a. Admit to [facility] hospital b. NPO after midnight c. Pain control d. Ortho consulted - surgery in AM e. CBC in AM 2. COPD a. Compensated 3. Hypokalemia a. Replace b. Check magnesium c. Recheck in AM 4. Malnutrition a. Will need supplementation between meals following surgery 5. Cellulitis a. Wound care b. Although patient has an allergy to penicillin with tongue swelling, Ancef is a reasonable antibiotic as its side chain is physically dissimilar from penicillins and has minimal potential for cross sensitivity.  c. Ancef 2g q8 hr  DVT prophylaxis:  SCDs Consultants: ortho Code Status: Full Family Communication: boyfriend present during interview and exam  Disposition Plan: pending   Levie Heritage, DO [facility] Pager [phone]  If 7PM-7AM, please contact night-coverage [email or web address] Password [email or web address]

## 2018-07-08 NOTE — ED Triage Notes (Signed)
Patient c/o right hip pain after falling while running from a cat. Patient denies hitting head or LOC. No blood thinners. Very limited ROM, unable to bear weight.

## 2018-07-08 NOTE — ED Notes (Signed)
EDP at bedside  

## 2018-07-08 NOTE — ED Provider Notes (Signed)
[facility] EMERGENCY DEPARTMENT Provider Note   CSN: [id number] Arrival date & time: 07/08/18  1620     History   Chief Complaint Chief Complaint  Patient presents with  . Fall    HPI Kristine Roberts is a 62 y.o. female.  She presents here today after having a mechanical fall going into her house just prior to arrival.  She states she was trying to avoid her cat and was on the steps and took a wrong step and fell down 2 or 3 steps onto the ground landing on her butt.  Since then she has had severe right hip pain.  She is unable to bear weight or straighten her leg out.  She denies any other injuries.  Denies LOC.  Did not hit her head.  No other complaints other than hip pain.  She has chronic wounds on her lower extremities that she states her at baseline.  The history is provided by the patient.  Fall  This is a new problem. The current episode started 1 to 2 hours ago. The problem occurs constantly. The problem has not changed since onset.Pertinent negatives include no chest pain, no abdominal pain, no headaches and no shortness of breath. The symptoms are aggravated by bending. The symptoms are relieved by position. She has tried nothing for the symptoms. The treatment provided no relief.    Past Medical History:  Diagnosis Date  . COPD (chronic obstructive pulmonary disease) (HCC)   . Dysphagia   . Esophagitis   . Gastric outlet obstruction   . Gastric stenosis   . Hiatal hernia   . PUD (peptic ulcer disease)   . Wounds, multiple     Patient Active Problem List   Diagnosis Date Noted  . Gastric ulcer 01/11/2018  . Hypokalemia 12/26/2017  . Cachexia (HCC) 12/26/2017  . Hypotension 12/26/2017  . Tobacco abuse 12/26/2017  . ETOH abuse 12/26/2017  . Anemia 12/26/2017  . Prolonged QT interval 12/26/2017  . Pressure injury of skin 12/26/2017  . Protein-calorie malnutrition, severe 03/05/2016  . Gastric outlet obstruction 03/03/2016  . COPD (chronic obstructive pulmonary  disease) (HCC) 03/03/2016  . Dysphagia 02/03/2016    Past Surgical History:  Procedure Laterality Date  . APPENDECTOMY    . BALLOON DILATION N/A 03/05/2016   Procedure: BALLOON DILATION;  Surgeon: Malissa Hippo, MD;  Location: [facility];  Service: Endoscopy;  Laterality: N/A;  pyloric channel dialtaion  . CHOLECYSTECTOMY    . ESOPHAGEAL DILATION N/A 03/03/2016   Procedure: ESOPHAGEAL DILATION;  Surgeon: Malissa Hippo, MD;  Location: [facility];  Service: Endoscopy;  Laterality: N/A;  . ESOPHAGEAL DILATION N/A 05/07/2016   Procedure: ESOPHAGEAL DILATION;  Surgeon: Malissa Hippo, MD;  Location: [facility];  Service: Endoscopy;  Laterality: N/A;  . ESOPHAGEAL DILATION  01/26/2018   Procedure: DILATION OF ANASTOMOTIC  STRICTURE;  Surgeon: Malissa Hippo, MD;  Location: [facility];  Service: Endoscopy;;  . ESOPHAGOGASTRODUODENOSCOPY N/A 03/03/2016   Procedure: ESOPHAGOGASTRODUODENOSCOPY (EGD);  Surgeon: Malissa Hippo, MD;  Location: [facility];  Service: Endoscopy;  Laterality: N/A;  2:00  . ESOPHAGOGASTRODUODENOSCOPY N/A 05/07/2016   Procedure: ESOPHAGOGASTRODUODENOSCOPY (EGD);  Surgeon: Malissa Hippo, MD;  [facility]: [facility];  Service: Endoscopy;  Laterality: N/A;  855 - moved to 6/2 @ 10:15 - Ann notified pt  . ESOPHAGOGASTRODUODENOSCOPY N/A 12/28/2017   Procedure: ESOPHAGOGASTRODUODENOSCOPY (EGD) with stricture dilation;  Surgeon: Malissa Hippo, MD;  [facility];  Service:  Endoscopy;  Laterality: N/A;  . ESOPHAGOGASTRODUODENOSCOPY N/A 01/26/2018   Procedure: ESOPHAGOGASTRODUODENOSCOPY (EGD);  Surgeon: Malissa Hippo, MD;  [facility];  Service: Endoscopy;  Laterality: N/A;  255  . ESOPHAGOGASTRODUODENOSCOPY (EGD) WITH PROPOFOL N/A 03/05/2016   Procedure: ESOPHAGOGASTRODUODENOSCOPY (EGD) WITH PROPOFOL Anastomotic stricture dilation ;  Surgeon: Malissa Hippo, MD;  Location: [facility];  Service: Endoscopy;  Laterality: N/A;  to be done in  OR under fluoro  . STOMACH SURGERY       OB History   None      Home Medications    Prior to Admission medications   Medication Sig Start Date End Date Taking? Authorizing Provider  albuterol (PROVENTIL HFA;VENTOLIN HFA) 108 (90 Base) MCG/ACT inhaler Inhale 2 puffs into the lungs every 6 (six) hours as needed for wheezing or shortness of breath.     [provider]  calcium carbonate (TUMS - DOSED IN MG ELEMENTAL CALCIUM) 500 MG chewable tablet Chew 1 tablet (200 mg of elemental calcium total) by mouth 3 (three) times daily. 12/30/17   Shon Hale, MD  cyclobenzaprine (FLEXERIL) 10 MG tablet Take 10 mg by mouth daily as needed for muscle spasms.  12/22/17   [provider]  doxycycline (VIBRAMYCIN) 100 MG capsule Take 1 capsule (100 mg total) by mouth 2 (two) times daily. 02/15/18   Vanetta Mulders, MD  DULERA 200-5 MCG/ACT AERO Inhale 1 puff into the lungs 2 (two) times daily.  12/22/17   [provider]  feeding supplement, ENSURE ENLIVE, (ENSURE ENLIVE) LIQD Take 237 mLs by mouth 3 (three) times daily between meals. 12/30/17   Shon Hale, MD  HYDROcodone-acetaminophen (NORCO/VICODIN) 5-325 MG tablet Take 1-2 tablets by mouth every 6 (six) hours as needed. 02/15/18   Vanetta Mulders, MD  Multiple Vitamin (MULTIVITAMIN WITH MINERALS) TABS tablet Take 1 tablet by mouth daily. 12/31/17   Shon Hale, MD  neomycin-bacitracin-polymyxin (NEOSPORIN) ointment Apply 1 application topically as needed for wound care.    [provider]  pantoprazole (PROTONIX) 40 MG tablet Take 1 tablet (40 mg total) by mouth daily before breakfast. 01/26/18   Rehman, Joline Maxcy, MD  tetrahydrozoline 0.05 % ophthalmic solution Place 1 drop into both eyes daily as needed (dry eyes).    [provider]  traZODone (DESYREL) 100 MG tablet Take 1 tablet (100 mg total) by mouth at bedtime. 12/30/17   Shon Hale, MD    Family History Family History  Problem  Relation Age of Onset  . Alzheimer's disease Mother   . COPD Mother   . Throat cancer Father   . Breast cancer Sister     Social History Social History   Tobacco Use  . Smoking status: Current Every Day Smoker    Packs/day: 1.00    Years: 20.00    Pack years: 20.00    Types: Cigarettes  . Smokeless tobacco: Never Used  Substance Use Topics  . Alcohol use: Yes    Alcohol/week: 7.2 oz    Types: 12 Cans of beer per week    Comment: beer two a day.   . Drug use: No     Allergies   Penicillins   Review of Systems Review of Systems  Constitutional: Negative for fever.  HENT: Negative for sore throat.   Eyes: Negative for visual disturbance.  Respiratory: Negative for shortness of breath.   Cardiovascular: Negative for chest pain.  Gastrointestinal: Negative for abdominal pain.  Genitourinary: Negative for dysuria.  Musculoskeletal: Negative for neck  pain.  Skin: Positive for wound. Negative for rash.  Neurological: Negative for headaches.     Physical Exam Updated Vital Signs BP (!) 118/55 (BP Location: Right Arm)   Pulse 71   Temp 99.4 F (37.4 C) (Oral)   Resp 19   Ht 5\' 2"  (1.575 m)   Wt 31.8 kg (70 lb)   SpO2 100%   BMI 12.80 kg/m   Physical Exam  Constitutional: Vital signs are normal. She appears cachectic.  HENT:  Head: Normocephalic and atraumatic.  Eyes: Conjunctivae are normal.  Neck: Neck supple.  Cardiovascular: Normal rate, normal heart sounds and intact distal pulses.  Pulmonary/Chest: Effort normal. No respiratory distress. She has no wheezes.  Abdominal: Soft. There is no tenderness. There is no guarding.  Musculoskeletal: She exhibits no deformity.  She has full range of motion of her bilateral upper extremities and her left lower extremity.  Her right leg is held in hip flexion.  She has nontender knee or ankle.  She has some ability to have some rotation at the hip but does not want to fully extend her leg.  She is got some chronic  erythema and edema to her lower legs with some superficial ulcerations.  Neurological: She is alert. GCS eye subscore is 4. GCS verbal subscore is 5. GCS motor subscore is 6.  Skin: Skin is warm and dry. Capillary refill takes less than 2 seconds. There is erythema (Lower legs).  Psychiatric: She has a normal mood and affect.  Nursing note and vitals reviewed.    ED Treatments / Results  Labs (all labs ordered are listed, but only abnormal results are displayed) Labs Reviewed  BASIC METABOLIC PANEL - Abnormal; Notable for the following components:      Result Value   Potassium 3.0 (*)    Calcium 8.5 (*)    All other components within normal limits  CBC WITH DIFFERENTIAL/PLATELET - Abnormal; Notable for the following components:   WBC 10.6 (*)    RBC 3.51 (*)    Hemoglobin 9.2 (*)    HCT 30.6 (*)    RDW 18.1 (*)    Neutro Abs 9.0 (*)    All other components within normal limits  CBC - Abnormal; Notable for the following components:   RBC 2.94 (*)    Hemoglobin 7.6 (*)    HCT 26.5 (*)    MCH 25.9 (*)    MCHC 28.7 (*)    RDW 18.3 (*)    All other components within normal limits  BASIC METABOLIC PANEL - Abnormal; Notable for the following components:   Calcium 8.0 (*)    All other components within normal limits  MRSA PCR SCREENING  PROTIME-INR  MAGNESIUM    EKG EKG Interpretation  Date/Time:  Saturday July 08 2018 17:32:01 EDT Ventricular Rate:  71 PR Interval:    QRS Duration: 136 QT Interval:  423 QTC Calculation: 460 R Axis:   106 Text Interpretation:  Sinus rhythm Right bundle branch block similar pattern to prior 1/19 Confirmed by 2/19 [phone]) on 07/08/2018 5:35:32 PM   Radiology Ct Hip Right Wo Contrast  Result Date: 07/08/2018 CLINICAL DATA:  Right hip fracture after fall while running from a CT. EXAM: CT OF THE RIGHT HIP WITHOUT CONTRAST TECHNIQUE: Multidetector CT imaging of the right hip was performed according to the standard protocol.  Multiplanar CT image reconstructions were also generated. COMPARISON:  Radiographs from 07/08/2018 FINDINGS: Bones/Joint/Cartilage There is an acute inter trochanteric fracture of the right  femur with slight impaction of the medial femoral neck upon the inter trochanter. Fracture also extends and slightly undermines the greater trochanter. Joint space narrowing of the right hip is seen without joint dislocation. Spurring is identified across the femoral head-neck juncture. The included pubic rami and pubic symphysis appear intact. Ligaments Suboptimally assessed by CT. Muscles and Tendons No intramuscular hemorrhage or atrophy. Soft tissues No focal soft tissue hematoma. IMPRESSION: Acute closed intertrochanteric fracture of the right femur with slight impaction of the medial aspect of the femoral head upon the trochanteric portion of the femur. No joint dislocation. Electronically Signed   By: Tollie Eth M.D.   On: 07/08/2018 18:59   Dg Hip Unilat With Pelvis 2-3 Views Right  Result Date: 07/08/2018 CLINICAL DATA:  Fall today outside, dodging a cat. Unable to straighten RIGHT leg. RIGHT hip pain. EXAM: DG HIP (WITH OR WITHOUT PELVIS) 2-3V RIGHT COMPARISON:  None. FINDINGS: The RIGHT hip appears foreshortened, accentuated by patient positioning. There is a linear lucency traversing the base of the femoral neck and mild irregularity of the lesser trochanter. Findings are consistent with impacted basicervical/intertrochanteric fracture. There is no dislocation. The LEFT hip appears intact. Bones appear radiolucent. IMPRESSION: Impacted fracture of the RIGHT femoral neck, possibly involving the lesser trochanter. Greater trochanter involvement cannot be excluded. Consider CT if needed for further characterization. Electronically Signed   By: Norva Pavlov M.D.   On: 07/08/2018 17:10    Procedures Procedures (including critical care time)  Medications Ordered in ED Medications  fentaNYL (SUBLIMAZE)  injection 50 mcg (has no administration in time range)     Initial Impression / Assessment and Plan / ED Course  I have reviewed the triage vital signs and the nursing notes.  Pertinent labs & imaging results that were available during my care of the patient were reviewed by me and considered in my medical decision making (see chart for details).  Clinical Course as of Jul 09 1048  Sat Jul 08, 2018  5564 62 year old cachectic female here after mechanical fall landing on her butt.  She is complaining of severe hip pain with any attempted straight.  She is distal neurovascular intact in all of her pain seems to be related to her right hip.  We are getting an IV interarticular some pain medicine and then x-rays.   [MB]  1828 Discussed with Dr. Aundria Rud from orthopedics.  He is asking if we can get a CT of that right hip.  He otherwise recommends the patient be evaluated by the hospitalist for admission down to [facility] and n.p.o. after midnight for possible surgery tomorrow.   [MB]    Clinical Course User Index [MB] Terrilee Files, MD     Final Clinical Impressions(s) / ED Diagnoses   Final diagnoses:  Closed right hip fracture, initial encounter [facility])  Hypokalemia    ED Discharge Orders    None       Terrilee Files, MD 07/09/18 1050

## 2018-07-09 ENCOUNTER — Inpatient Hospital Stay (HOSPITAL_COMMUNITY): Payer: Medicaid Other | Admitting: Certified Registered Nurse Anesthetist

## 2018-07-09 ENCOUNTER — Inpatient Hospital Stay (HOSPITAL_COMMUNITY): Payer: Medicaid Other

## 2018-07-09 ENCOUNTER — Other Ambulatory Visit: Payer: Self-pay

## 2018-07-09 ENCOUNTER — Encounter (HOSPITAL_COMMUNITY)
Admission: EM | Disposition: A | Discharge status: Home Health | Payer: Self-pay | Source: Home / Self Care | Attending: Family Medicine

## 2018-07-09 ENCOUNTER — Encounter (HOSPITAL_COMMUNITY): Payer: Self-pay | Admitting: Certified Registered Nurse Anesthetist

## 2018-07-09 HISTORY — PX: INTRAMEDULLARY (IM) NAIL INTERTROCHANTERIC: SHX5875

## 2018-07-09 LAB — BASIC METABOLIC PANEL
ANION GAP: 7 (ref 5–15)
BUN: 9 mg/dL (ref 8–23)
CO2: 22 mmol/L (ref 22–32)
Calcium: 8 mg/dL — ABNORMAL LOW (ref 8.9–10.3)
Chloride: 110 mmol/L (ref 98–111)
Creatinine, Ser: 0.55 mg/dL (ref 0.44–1.00)
Glucose, Bld: 73 mg/dL (ref 70–99)
POTASSIUM: 4.5 mmol/L (ref 3.5–5.1)
SODIUM: 139 mmol/L (ref 135–145)

## 2018-07-09 LAB — CBC
HCT: 26.5 % — ABNORMAL LOW (ref 36.0–46.0)
HEMOGLOBIN: 7.6 g/dL — AB (ref 12.0–15.0)
MCH: 25.9 pg — AB (ref 26.0–34.0)
MCHC: 28.7 g/dL — AB (ref 30.0–36.0)
MCV: 90.1 fL (ref 78.0–100.0)
Platelets: 292 10*3/uL (ref 150–400)
RBC: 2.94 MIL/uL — ABNORMAL LOW (ref 3.87–5.11)
RDW: 18.3 % — ABNORMAL HIGH (ref 11.5–15.5)
WBC: 5.6 10*3/uL (ref 4.0–10.5)

## 2018-07-09 LAB — PREPARE RBC (CROSSMATCH)

## 2018-07-09 LAB — ABO/RH: ABO/RH(D): A NEG

## 2018-07-09 SURGERY — FIXATION, FRACTURE, INTERTROCHANTERIC, WITH INTRAMEDULLARY ROD
Anesthesia: General | Site: Leg Upper | Laterality: Right

## 2018-07-09 MED ORDER — ROCURONIUM BROMIDE 100 MG/10ML IV SOLN
INTRAVENOUS | Status: DC | PRN
  Administered 2018-07-09: 20 mg via INTRAVENOUS

## 2018-07-09 MED ORDER — POVIDONE-IODINE 10 % EX SWAB: 2.0000 "application " | Freq: Once | CUTANEOUS | Status: DC

## 2018-07-09 MED ORDER — MIDAZOLAM HCL 5 MG/5ML IJ SOLN
INTRAMUSCULAR | Status: DC | PRN
  Administered 2018-07-09: 1 mg via INTRAVENOUS

## 2018-07-09 MED ORDER — 0.9 % SODIUM CHLORIDE (POUR BTL) OPTIME
TOPICAL | Status: DC | PRN
  Administered 2018-07-09: 1000 mL

## 2018-07-09 MED ORDER — EPHEDRINE SULFATE 50 MG/ML IJ SOLN
INTRAMUSCULAR | Status: DC | PRN
  Administered 2018-07-09 (×2): 5 mg via INTRAVENOUS

## 2018-07-09 MED ORDER — STERILE WATER FOR IRRIGATION IR SOLN
Status: DC | PRN
  Administered 2018-07-09: 1000 mL

## 2018-07-09 MED ORDER — INSULIN ASPART 100 UNIT/ML ~~LOC~~ SOLN: 6.0000 [IU] | Freq: Once | SUBCUTANEOUS | Status: DC

## 2018-07-09 MED ORDER — HYDROMORPHONE HCL 1 MG/ML IJ SOLN
INTRAMUSCULAR | Status: AC
  Administered 2018-07-09: 0.5 mg via INTRAVENOUS
  Filled 2018-07-09: qty 1

## 2018-07-09 MED ORDER — PROPOFOL 10 MG/ML IV BOLUS
INTRAVENOUS | Status: DC | PRN
  Administered 2018-07-09: 50 mg via INTRAVENOUS

## 2018-07-09 MED ORDER — METOCLOPRAMIDE HCL 5 MG PO TABS
5.0000 mg | ORAL_TABLET | Freq: Three times a day (TID) | ORAL | Status: DC | PRN
  Administered 2018-07-13: 5 mg via ORAL
  Filled 2018-07-09: qty 1

## 2018-07-09 MED ORDER — FENTANYL CITRATE (PF) 100 MCG/2ML IJ SOLN
INTRAMUSCULAR | Status: DC | PRN
  Administered 2018-07-09 (×3): 50 ug via INTRAVENOUS

## 2018-07-09 MED ORDER — ROCURONIUM BROMIDE 10 MG/ML (PF) SYRINGE
PREFILLED_SYRINGE | INTRAVENOUS | Status: AC
  Filled 2018-07-09: qty 30

## 2018-07-09 MED ORDER — SODIUM CHLORIDE 0.9 % IV SOLN
INTRAVENOUS | Status: DC | PRN
  Administered 2018-07-09: 12:00:00 via INTRAVENOUS

## 2018-07-09 MED ORDER — CHLORHEXIDINE GLUCONATE 4 % EX LIQD
60.0000 mL | Freq: Once | CUTANEOUS | Status: AC
  Administered 2018-07-09: 4 via TOPICAL
  Filled 2018-07-09 (×2): qty 60

## 2018-07-09 MED ORDER — DEXAMETHASONE SODIUM PHOSPHATE 10 MG/ML IJ SOLN
INTRAMUSCULAR | Status: DC | PRN
  Administered 2018-07-09: 5 mg via INTRAVENOUS

## 2018-07-09 MED ORDER — LACTATED RINGERS IV SOLN
INTRAVENOUS | Status: DC | PRN
  Administered 2018-07-09: 12:00:00 via INTRAVENOUS

## 2018-07-09 MED ORDER — SUGAMMADEX SODIUM 200 MG/2ML IV SOLN
INTRAVENOUS | Status: DC | PRN
  Administered 2018-07-09: 70.2 mg via INTRAVENOUS

## 2018-07-09 MED ORDER — FENTANYL CITRATE (PF) 100 MCG/2ML IJ SOLN: 25.0000 ug | INTRAMUSCULAR | Status: DC | PRN

## 2018-07-09 MED ORDER — CEFAZOLIN SODIUM-DEXTROSE 2-4 GM/100ML-% IV SOLN
2.0000 g | INTRAVENOUS | Status: AC
  Administered 2018-07-09: 2 g via INTRAVENOUS
  Filled 2018-07-09 (×2): qty 100

## 2018-07-09 MED ORDER — ONDANSETRON HCL 4 MG PO TABS: 4.0000 mg | ORAL_TABLET | Freq: Four times a day (QID) | ORAL | Status: DC | PRN

## 2018-07-09 MED ORDER — DOCUSATE SODIUM 100 MG PO CAPS
100.0000 mg | ORAL_CAPSULE | Freq: Two times a day (BID) | ORAL | Status: DC
  Administered 2018-07-09 – 2018-07-10 (×2): 100 mg via ORAL
  Filled 2018-07-09 (×2): qty 1

## 2018-07-09 MED ORDER — SODIUM CHLORIDE 0.9 % IV SOLN: 10.0000 mL/h | Freq: Once | INTRAVENOUS | Status: DC

## 2018-07-09 MED ORDER — METOCLOPRAMIDE HCL 5 MG/ML IJ SOLN
5.0000 mg | Freq: Three times a day (TID) | INTRAMUSCULAR | Status: DC | PRN
  Administered 2018-07-11 – 2018-07-15 (×5): 10 mg via INTRAVENOUS
  Filled 2018-07-09 (×5): qty 2

## 2018-07-09 MED ORDER — ONDANSETRON HCL 4 MG/2ML IJ SOLN
INTRAMUSCULAR | Status: DC | PRN
  Administered 2018-07-09: 4 mg via INTRAVENOUS

## 2018-07-09 MED ORDER — TRAMADOL HCL 50 MG PO TABS: 50.0000 mg | ORAL_TABLET | Freq: Four times a day (QID) | ORAL | 0 refills | Status: DC | PRN

## 2018-07-09 MED ORDER — LIDOCAINE 2% (20 MG/ML) 5 ML SYRINGE
INTRAMUSCULAR | Status: AC
  Filled 2018-07-09: qty 15

## 2018-07-09 MED ORDER — LIDOCAINE HCL (CARDIAC) PF 100 MG/5ML IV SOSY
PREFILLED_SYRINGE | INTRAVENOUS | Status: DC | PRN
  Administered 2018-07-09: 10 mg via INTRAVENOUS

## 2018-07-09 MED ORDER — MIDAZOLAM HCL 2 MG/2ML IJ SOLN
INTRAMUSCULAR | Status: AC
  Filled 2018-07-09: qty 2

## 2018-07-09 MED ORDER — FENTANYL CITRATE (PF) 250 MCG/5ML IJ SOLN
INTRAMUSCULAR | Status: AC
  Filled 2018-07-09: qty 5

## 2018-07-09 MED ORDER — HYDROMORPHONE HCL 1 MG/ML IJ SOLN
0.2500 mg | INTRAMUSCULAR | Status: DC | PRN
  Administered 2018-07-09 (×2): 0.5 mg via INTRAVENOUS

## 2018-07-09 MED ORDER — PROPOFOL 10 MG/ML IV BOLUS
INTRAVENOUS | Status: AC
Start: 2018-07-09 — End: ?
  Filled 2018-07-09: qty 20

## 2018-07-09 MED ORDER — ONDANSETRON HCL 4 MG/2ML IJ SOLN
4.0000 mg | Freq: Four times a day (QID) | INTRAMUSCULAR | Status: DC | PRN
  Administered 2018-07-10 – 2018-07-14 (×9): 4 mg via INTRAVENOUS
  Filled 2018-07-09 (×8): qty 2

## 2018-07-09 SURGICAL SUPPLY — 37 items
ALCOHOL 70% 16 OZ (MISCELLANEOUS) ×2 IMPLANT
BIT DRILL FLUTED FEMUR 4.2/3 (BIT) ×2 IMPLANT
BNDG COHESIVE 6X5 TAN STRL LF (GAUZE/BANDAGES/DRESSINGS) ×4 IMPLANT
CANISTER SUCTION WELLS/JOHNSON (MISCELLANEOUS) ×2 IMPLANT
COVER PERINEAL POST (MISCELLANEOUS) ×2 IMPLANT
COVER SURGICAL LIGHT HANDLE (MISCELLANEOUS) ×2 IMPLANT
DRAPE HALF SHEET 40X57 (DRAPES) IMPLANT
DRAPE INCISE IOBAN 66X45 STRL (DRAPES) ×2 IMPLANT
DRAPE STERI IOBAN 125X83 (DRAPES) ×2 IMPLANT
DRSG ADAPTIC 3X8 NADH LF (GAUZE/BANDAGES/DRESSINGS) ×2 IMPLANT
DRSG MEPILEX BORDER 4X4 (GAUZE/BANDAGES/DRESSINGS) ×2 IMPLANT
DURAPREP 26ML APPLICATOR (WOUND CARE) ×2 IMPLANT
ELECT CAUTERY BLADE 6.4 (BLADE) IMPLANT
ELECT REM PT RETURN 9FT ADLT (ELECTROSURGICAL) ×2
ELECTRODE REM PT RTRN 9FT ADLT (ELECTROSURGICAL) ×1 IMPLANT
GAUZE SPONGE 4X4 12PLY STRL LF (GAUZE/BANDAGES/DRESSINGS) ×2 IMPLANT
GLOVE BIO SURGEON STRL SZ7.5 (GLOVE) ×2 IMPLANT
GLOVE BIOGEL PI IND STRL 8 (GLOVE) ×1 IMPLANT
GLOVE BIOGEL PI INDICATOR 8 (GLOVE) ×1
GOWN STRL REUS W/ TWL LRG LVL3 (GOWN DISPOSABLE) ×1 IMPLANT
GOWN STRL REUS W/ TWL XL LVL3 (GOWN DISPOSABLE) ×1 IMPLANT
GOWN STRL REUS W/TWL LRG LVL3 (GOWN DISPOSABLE) ×1
GOWN STRL REUS W/TWL XL LVL3 (GOWN DISPOSABLE) ×1
GUIDEWIRE 3.2X400 (WIRE) ×4 IMPLANT
KIT BASIN OR (CUSTOM PROCEDURE TRAY) ×2 IMPLANT
KIT TURNOVER KIT B (KITS) ×2 IMPLANT
NAIL TROCH FIX 10X170 130 (Nail) ×2 IMPLANT
NS IRRIG 1000ML POUR BTL (IV SOLUTION) ×2 IMPLANT
PACK GENERAL/GYN (CUSTOM PROCEDURE TRAY) ×2 IMPLANT
PAD ARMBOARD 7.5X6 YLW CONV (MISCELLANEOUS) ×4 IMPLANT
SCREW LOCKING 5.0X32MM (Screw) ×2 IMPLANT
SCREW TFNA 90MM HIP (Orthopedic Implant) ×2 IMPLANT
STAPLER VISISTAT 35W (STAPLE) ×2 IMPLANT
SUT MON AB 2-0 CT1 36 (SUTURE) ×2 IMPLANT
TOWEL OR 17X24 6PK STRL BLUE (TOWEL DISPOSABLE) ×2 IMPLANT
TOWEL OR 17X26 10 PK STRL BLUE (TOWEL DISPOSABLE) ×2 IMPLANT
WATER STERILE IRR 1000ML POUR (IV SOLUTION) ×2 IMPLANT

## 2018-07-09 NOTE — Anesthesia Postprocedure Evaluation (Signed)
Anesthesia Post Note  Patient: Kristine Roberts  Procedure(s) Performed: INTRAMEDULLARY (IM) NAIL INTERTROCHANTRIC (Right Leg Upper)     Patient location during evaluation: PACU Anesthesia Type: General Level of consciousness: awake and alert Pain management: pain level controlled Vital Signs Assessment: post-procedure vital signs reviewed and stable Respiratory status: spontaneous breathing, nonlabored ventilation, respiratory function stable and patient connected to nasal cannula oxygen Cardiovascular status: blood pressure returned to baseline and stable Postop Assessment: no apparent nausea or vomiting Anesthetic complications: no    Last Vitals:  Vitals:   07/09/18 1510 07/09/18 1551  BP:  128/73  Pulse:  60  Resp: 11 12  Temp:  36.5 C  SpO2:  100%    Last Pain:  Vitals:   07/09/18 1551  TempSrc: Oral  PainSc:                  Kennieth Rad

## 2018-07-09 NOTE — Op Note (Signed)
Date of Surgery: 07/09/2018  INDICATIONS: Kristine Roberts is a 62 y.o.-year-old female who sustained a right hip fracture. The risks and benefits of the procedure discussed with the patient prior to the procedure and all questions were answered; consent was obtained.  The patient was in her normal state of health prior to arrival at the [facility] emergency department.  She had a fall after tripping over her cat.  She sustained a right comminuted intertrochanteric hip fracture.  She was transferred to [facility] for definitive management under my care.  We reviewed the risk, benefits, and indications of this procedure at length.  These include but are not limited to bleeding, infection, damage to surrounding neurovascular structures, nonunion, malunion, hardware failure, persistent pain, need for further surgery, development of blood clots, and the risk of anesthesia.  She did provide informed consent.   PREOPERATIVE DIAGNOSIS: right hip fracture   POSTOPERATIVE DIAGNOSIS: Same   PROCEDURE: Treatment of intertrochanteric, pertrochanteric, subtrochanteric fracture with intramedullary implant. CPT [phone]   SURGEON: Kathi Der. Aundria Rud, M.D.   ANESTHESIA: general   IV FLUIDS AND URINE: See anesthesia record   ESTIMATED BLOOD LOSS: 75 cc  IMPLANTS: Synthes TFN a 10 mm x 180 mm 90 mm proximal compression screw 32 mm distal interlock  Blood products:  1 unit packed red blood cells transfused intraoperatively  DRAINS: None.   COMPLICATIONS: None.   DESCRIPTION OF PROCEDURE: The patient was brought to the operating room and placed supine on the operating table. The patient's leg had been signed prior to the procedure. The patient had the anesthesia placed by the anesthesiologist. The prep verification and incision time-outs were performed to confirm that this was the correct patient, site, side and location. The patient had an SCD on the opposite lower extremity. The patient did receive antibiotics  prior to the incision and was re-dosed during the procedure as needed at indicated intervals. The patient was positioned on the fracture table with the table in traction and internal rotation to reduce the hip. The well leg was placed in a scissor position and all bony prominences were well-padded. The patient had the lower extremity prepped and draped in the standard surgical fashion. The incision was made 4 finger breadths superior to the greater trochanter. A guide pin was inserted into the tip of the greater trochanter under fluoroscopic guidance. An opening reamer was used to gain access to the femoral canal. The nail length was measured and inserted down the femoral canal to its proper depth. The appropriate version of insertion for the lag screw was found under fluoroscopy. A pin was inserted up the femoral neck through the jig. The length of the lag screw was then measured. The lag screw was inserted as near to center-center in the head as possible. The leg was taken out of traction, then the compression screw was used to compress across the fracture. Compression was visualized on serial xrays.   We next turned our attention to the distal interlocking screw.  This was placed through the drill guide of the nail inserter.  A small incision was made overlying the lateral thigh at the screw site, and a tonsil was used to disect down to bone.  A drill pass was made through the jig and across the nail through both cortices.  This was measured, and the appropriate screw was placed under hand power and found to have good bite.    The wound was copiously irrigated with saline and the subcutaneous layer closed with  2.0 vicryl and the skin was reapproximated with staples. The wounds were cleaned and dried a final time and a sterile dressing was placed. The hip was taken through a range of motion at the end of the case under fluoroscopic imaging to visualize the approach-withdraw phenomenon and confirm implant  length in the head. The patient was then awakened from anesthesia and taken to the recovery room in stable condition. All counts were correct at the end of the case.   POSTOPERATIVE PLAN: The patient will be weight bearing as tolerated and will return in 2 weeks for staple removal and the patient will receive DVT prophylaxis based on other medications, activity level, and risk ratio of bleeding to thrombosis.  Her recognition for her will be for 81 mg aspirin once daily for 6 weeks.   Kristine Rued, MD [facility] [phone] 2:12 PM

## 2018-07-09 NOTE — Anesthesia Preprocedure Evaluation (Signed)
Anesthesia Evaluation  Patient identified by MRN, date of birth, ID band Patient awake    Reviewed: Allergy & Precautions, NPO status , Patient's Chart, lab work & pertinent test results  Airway Mallampati: II  TM Distance: >3 FB Neck ROM: Full    Dental  (+) Dental Advisory Given   Pulmonary COPD, Current Smoker,    breath sounds clear to auscultation       Cardiovascular negative cardio ROS   Rhythm:Regular Rate:Normal     Neuro/Psych negative neurological ROS     GI/Hepatic Neg liver ROS, hiatal hernia, PUD, GERD  ,  Endo/Other  Malnutrition.  Renal/GU negative Renal ROS     Musculoskeletal   Abdominal   Peds  Hematology  (+) anemia ,   Anesthesia Other Findings   Reproductive/Obstetrics                             Lab Results  Component Value Date   WBC 5.6 07/09/2018   HGB 7.6 (L) 07/09/2018   HCT 26.5 (L) 07/09/2018   MCV 90.1 07/09/2018   PLT 292 07/09/2018   Lab Results  Component Value Date   CREATININE 0.55 07/09/2018   BUN 9 07/09/2018   NA 139 07/09/2018   K 4.5 07/09/2018   CL 110 07/09/2018   CO2 22 07/09/2018    Anesthesia Physical Anesthesia Plan  ASA: III  Anesthesia Plan: General   Post-op Pain Management:    Induction: Intravenous  PONV Risk Score and Plan: 2 and Ondansetron, Dexamethasone and Treatment may vary due to age or medical condition  Airway Management Planned: Oral ETT  Additional Equipment:   Intra-op Plan:   Post-operative Plan: Extubation in OR  Informed Consent: I have reviewed the patients History and Physical, chart, labs and discussed the procedure including the risks, benefits and alternatives for the proposed anesthesia with the patient or authorized representative who has indicated his/her understanding and acceptance.     Plan Discussed with:   Anesthesia Plan Comments:         Anesthesia Quick Evaluation

## 2018-07-09 NOTE — Anesthesia Procedure Notes (Signed)
Procedure Name: Intubation Date/Time: 07/09/2018 1:12 PM Performed by: Clearnce Sorrel, CRNA Pre-anesthesia Checklist: Patient identified, Emergency Drugs available, Suction available, Patient being monitored and Timeout performed Patient Re-evaluated:Patient Re-evaluated prior to induction Oxygen Delivery Method: Circle system utilized Preoxygenation: Pre-oxygenation with 100% oxygen Induction Type: IV induction Ventilation: Mask ventilation without difficulty and Oral airway inserted - appropriate to patient size Laryngoscope Size: Mac and 3 Grade View: Grade I Tube type: Oral Tube size: 7.0 mm Number of attempts: 1 Airway Equipment and Method: Stylet Placement Confirmation: ETT inserted through vocal cords under direct vision,  positive ETCO2 and breath sounds checked- equal and bilateral Secured at: 20 cm Tube secured with: Tape Dental Injury: Teeth and Oropharynx as per pre-operative assessment

## 2018-07-09 NOTE — Transfer of Care (Signed)
Immediate Anesthesia Transfer of Care Note  Patient: Kristine Roberts  Procedure(s) Performed: INTRAMEDULLARY (IM) NAIL INTERTROCHANTRIC (Right Leg Upper)  Patient Location: PACU  Anesthesia Type:General  Level of Consciousness: awake, alert  and oriented  Airway & Oxygen Therapy: Patient Spontanous Breathing and Patient connected to nasal cannula oxygen  Post-op Assessment: Report given to RN and Post -op Vital signs reviewed and stable  Post vital signs: Reviewed and stable  Last Vitals:  Vitals Value Taken Time  BP 111/62 07/09/2018  2:21 PM  Temp    Pulse 61 07/09/2018  2:22 PM  Resp 11 07/09/2018  2:22 PM  SpO2 100 % 07/09/2018  2:22 PM  Vitals shown include unvalidated device data.  Last Pain:  Vitals:   07/09/18 0631  TempSrc:   PainSc: Asleep      Patients Stated Pain Goal: 0 (07/08/18 2345)  Complications: No apparent anesthesia complications

## 2018-07-09 NOTE — Discharge Instructions (Signed)
Orthopedic discharge instructions:  -Okay for full weightbearing as tolerated with a walker or cane as directed by your therapist to the right leg. -For the prevention of blood clots take an 81 mg aspirin once daily for 6 weeks. -Apply ice to the right hip for 30 minutes out of each hour.  For mild to moderate pain use Tylenol and/or Advil.  For breakthrough pain use tramadol as needed. -Maintain dry dressings to the right hip.  These may be changed once daily if needed.  Keep your  incision clean and dry at all times. -Your follow-up appointment will be with Dr. Aundria Rud in 2 weeks.

## 2018-07-09 NOTE — Brief Op Note (Signed)
07/09/2018  2:11 PM  PATIENT:  Kristine Roberts  62 y.o. female  PRE-OPERATIVE DIAGNOSIS:  hip fx  POST-OPERATIVE DIAGNOSIS:  hip fx  PROCEDURE:  Procedure(s): INTRAMEDULLARY (IM) NAIL INTERTROCHANTRIC (Right)  SURGEON:  Surgeon(s) and Role:    * Yolonda Kida, MD - Primary  PHYSICIAN ASSISTANT:   ASSISTANTS: none   ANESTHESIA:   general  EBL:  75 cc    BLOOD ADMINISTERED:1 unit CC PRBC  DRAINS: none   LOCAL MEDICATIONS USED:  NONE  SPECIMEN:  No Specimen  DISPOSITION OF SPECIMEN:  N/A  COUNTS:  YES  TOURNIQUET:  * No tourniquets in log *  DICTATION: .Note written in EPIC  PLAN OF CARE: Admit to inpatient   PATIENT DISPOSITION:  PACU - hemodynamically stable.   Delay start of Pharmacological VTE agent (>24hrs) due to surgical blood loss or risk of bleeding: not applicable

## 2018-07-09 NOTE — Progress Notes (Addendum)
PROGRESS NOTE  Marissah Buri ESL:[id number] DOB: 1956-09-02 DOA: 07/08/2018 PCP: [facility], [facility]  HPI/Recap of past 24 hours: HPI: Kashanti Olshansky is a 62 y.o. female with a history of COPD, malnutrition, osteopenia, PUD. Patient presents after mechanical fall at home. She was trying to avoid her cat, which was on the steps. A misstep caused her to fall, landing on her buttocks and right hip. She immediately had right hip pain. Not able to bear weight. No radiation to pain. Pain is sharp and severe. Worse with movement and improved with IV narcotics. No other palliating or provoking symptoms.   Subjective: Patient seen and examined she still complaining of pain in the right hip.  She is on.  She is waiting to be taken to the OR for surgery to the right hip   Assessment/Plan: Principal Problem:   Closed right hip fracture (HCC) Active Problems:   COPD (chronic obstructive pulmonary disease) (HCC)   Protein-calorie malnutrition, severe   Hypokalemia   Cellulitis of both lower extremities    1. Right closed hip fracture Ortho consulted - surgery TODAY a. CBC in AM 2. COPD a. Compensated 3. Hypokalemia resolved a. Check magnesium b. Recheck in AM 4. Malnutrition a. Will need supplementation between meals following surgery 5. Cellulitis a. Wound care b. Ancef 2g q8 hr  DVT prophylaxis: SCDs Consultants: ortho Code Status: Full Family Communication: boyfriend present  Disposition Plan: pending   Procedures:  For ORIF TODAY   Antimicrobials: Ancef 2g q8 hr   Objective: Vitals:   07/09/18 0850 07/09/18 1420  BP:  111/62  Pulse:  74  Resp:  12  Temp:  (!) 97.4 F (36.3 C)  SpO2: 99% 100%    Intake/Output Summary (Last 24 hours) at 07/09/2018 1438 Last data filed at 07/09/2018 1413 Gross per 24 hour  Intake 1894.93 ml  Output 50 ml  Net 1844.93 ml   Filed Weights   07/08/18 1626 07/08/18 2328  Weight: 31.8 kg (70 lb) 35.1 kg (77 lb 6.1 oz)    Body mass index is 14.15 kg/m.  Exam:   General: Elderly female. Awake and alert and oriented x3.   No acute cardiopulmonary distress.   Malnourished  HEENT: Normocephalic atraumatic.  Right and left ears normal in appearance.  Pupils equal, round, reactive to light. Extraocular muscles are intact. Sclerae anicteric and noninjected.  Moist mucosal membranes. No mucosal lesions.   Neck: Neck supple without lymphadenopathy. No carotid bruits. No masses palpated.   Cardiovascular: Regular rate with normal S1-S2 sounds. No murmurs, rubs, gallops auscultated. No JVD.   Respiratory: Good respiratory effort with no wheezes, rales, rhonchi. Lungs clear to auscultation bilaterally.  No accessory muscle use.  Abdomen: Soft, nontender, nondistended. Active bowel sounds. No masses or hepatosplenomegaly   Skin: Several ulcerations of lower extremeties, about 3.5cm in diameter. Erythema surrounding the ulcerations extending 3 cm from ulceration edges. No rashes, lesions, or ulcerations.  Dry, warm to touch. 2+ dorsalis pedis and radial pulses.  Musculoskeletal: No calf or leg pain. Right leg shortened and externally rotated. Pt won't straighten leg.  No contractures   Psychiatric: Intact judgment and insight. Pleasant and cooperative.  Neurologic: No focal neurological deficits. Strength is 5/5 and symmetric in upper and lower extremities.  Cranial nerves II through XII are grossly intact.      Data Reviewed: CBC: Recent Labs  Lab 07/08/18 1710 07/09/18 0404  WBC 10.6* 5.6  NEUTROABS 9.0*  --   HGB 9.2* 7.6*  HCT  30.6* 26.5*  MCV 87.2 90.1  PLT 349 292   Basic Metabolic Panel: Recent Labs  Lab 07/08/18 1710 07/09/18 0633  NA 136 139  K 3.0* 4.5  CL 104 110  CO2 24 22  GLUCOSE 89 73  BUN 13 9  CREATININE 0.72 0.55  CALCIUM 8.5* 8.0*  MG 2.1  --    GFR: Estimated Creatinine Clearance: 40.4 mL/min (by C-G formula based on SCr of 0.55 mg/dL). Liver Function  Tests: No results for input(s): AST, ALT, ALKPHOS, BILITOT, PROT, ALBUMIN in the last 168 hours. No results for input(s): LIPASE, AMYLASE in the last 168 hours. No results for input(s): AMMONIA in the last 168 hours. Coagulation Profile: Recent Labs  Lab 07/08/18 1710  INR 0.95   Cardiac Enzymes: No results for input(s): CKTOTAL, CKMB, CKMBINDEX, TROPONINI in the last 168 hours. BNP (last 3 results) No results for input(s): PROBNP in the last 8760 hours. HbA1C: No results for input(s): HGBA1C in the last 72 hours. CBG: No results for input(s): GLUCAP in the last 168 hours. Lipid Profile: No results for input(s): CHOL, HDL, LDLCALC, TRIG, CHOLHDL, LDLDIRECT in the last 72 hours. Thyroid Function Tests: No results for input(s): TSH, T4TOTAL, FREET4, T3FREE, THYROIDAB in the last 72 hours. Anemia Panel: No results for input(s): VITAMINB12, FOLATE, FERRITIN, TIBC, IRON, RETICCTPCT in the last 72 hours. Urine analysis:    Component Value Date/Time   COLORURINE YELLOW 12/26/2017 1006   APPEARANCEUR CLEAR 12/26/2017 1006   LABSPEC 1.026 12/26/2017 1006   PHURINE 5.0 12/26/2017 1006   GLUCOSEU NEGATIVE 12/26/2017 1006   HGBUR NEGATIVE 12/26/2017 1006   BILIRUBINUR NEGATIVE 12/26/2017 1006   KETONESUR 5 (A) 12/26/2017 1006   PROTEINUR NEGATIVE 12/26/2017 1006   NITRITE NEGATIVE 12/26/2017 1006   LEUKOCYTESUR NEGATIVE 12/26/2017 1006   Sepsis Labs: @LABRCNTIP (procalcitonin:4,lacticidven:4)  ) Recent Results (from the past 240 hour(s))  MRSA PCR Screening     Status: None   Collection Time: 07/08/18  8:00 PM  Result Value Ref Range Status   MRSA by PCR NEGATIVE NEGATIVE Final    Comment:        The GeneXpert MRSA Assay (FDA approved for NASAL specimens only), is one component of a comprehensive MRSA colonization surveillance program. It is not intended to diagnose MRSA infection nor to guide or monitor treatment for MRSA infections. Performed at [facility], [address], [place], [place]       Studies: Ct Hip Right Wo Contrast  Result Date: 07/08/2018 CLINICAL DATA:  Right hip fracture after fall while running from a CT. EXAM: CT OF THE RIGHT HIP WITHOUT CONTRAST TECHNIQUE: Multidetector CT imaging of the right hip was performed according to the standard protocol. Multiplanar CT image reconstructions were also generated. COMPARISON:  Radiographs from 07/08/2018 FINDINGS: Bones/Joint/Cartilage There is an acute inter trochanteric fracture of the right femur with slight impaction of the medial femoral neck upon the inter trochanter. Fracture also extends and slightly undermines the greater trochanter. Joint space narrowing of the right hip is seen without joint dislocation. Spurring is identified across the femoral head-neck juncture. The included pubic rami and pubic symphysis appear intact. Ligaments Suboptimally assessed by CT. Muscles and Tendons No intramuscular hemorrhage or atrophy. Soft tissues No focal soft tissue hematoma. IMPRESSION: Acute closed intertrochanteric fracture of the right femur with slight impaction of the medial aspect of the femoral head upon the trochanteric portion of the femur. No joint dislocation. Electronically Signed   By: 09/07/2018.D.  On: 07/08/2018 18:59   Dg Hip Unilat With Pelvis 2-3 Views Right  Result Date: 07/08/2018 CLINICAL DATA:  Fall today outside, dodging a cat. Unable to straighten RIGHT leg. RIGHT hip pain. EXAM: DG HIP (WITH OR WITHOUT PELVIS) 2-3V RIGHT COMPARISON:  None. FINDINGS: The RIGHT hip appears foreshortened, accentuated by patient positioning. There is a linear lucency traversing the base of the femoral neck and mild irregularity of the lesser trochanter. Findings are consistent with impacted basicervical/intertrochanteric fracture. There is no dislocation. The LEFT hip appears intact. Bones appear radiolucent. IMPRESSION: Impacted fracture of the RIGHT femoral neck, possibly involving the lesser  trochanter. Greater trochanter involvement cannot be excluded. Consider CT if needed for further characterization. Electronically Signed   By: Norva Pavlov M.D.   On: 07/08/2018 17:10    Scheduled Meds: . [MAR Hold] citalopram  20 mg Oral Daily  . [MAR Hold] feeding supplement (ENSURE ENLIVE)  237 mL Oral TID BM  . HYDROmorphone      . [MAR Hold] mometasone-formoterol  1 puff Inhalation BID  . [MAR Hold] potassium chloride  40 mEq Oral BID  . povidone-iodine  2 application Topical Once  . [MAR Hold] traZODone  100 mg Oral QHS    Continuous Infusions: . sodium chloride    . 0.9 % NaCl with KCl 40 mEq / L 100 mL/hr (07/09/18 1114)  . [MAR Hold]  ceFAZolin (ANCEF) IV 2 g (07/09/18 0701)     LOS: 1 day     Myrtie Neither, MD [facility]  To reach me or the doctor on call, go to: [email or web address] Password [facility]  07/09/2018, 2:38 PM

## 2018-07-09 NOTE — Progress Notes (Signed)
Arrived to unit via stretcher from [facility] ED. No family with patient. See assessment and admission.

## 2018-07-09 NOTE — Consult Note (Signed)
ORTHOPAEDIC CONSULTATION  REQUESTING PHYSICIAN: Myrtie Neither, MD  PCP:  [facility], [facility]  Chief Complaint: right hip fracture  HPI: Kristine Roberts is a 62 y.o. female who complains of right hip pain following a fall yesterday at her home.  She was trying to avoid her cat and fell onto her right hip.  She states that baseline she does not require any assistive devices to ambulate.  Currently she denies any numbness or tingling but does have some decreased sensation in the right leg.  She has a recent history of a wound to the right lower leg in the pretibial region from a blister that she developed while being hospitalized for hyperkalemia.  This being managed at the [facility] wound clinic with Santyl.  She does smoke about a pack and half a day.  She denies diabetes.  Past Medical History:  Diagnosis Date  . COPD (chronic obstructive pulmonary disease) (HCC)   . Dysphagia   . Esophagitis   . Gastric outlet obstruction   . Gastric stenosis   . GERD (gastroesophageal reflux disease)   . Hiatal hernia   . PUD (peptic ulcer disease)   . Wounds, multiple    Past Surgical History:  Procedure Laterality Date  . APPENDECTOMY    . BALLOON DILATION N/A 03/05/2016   Procedure: BALLOON DILATION;  Surgeon: Malissa Hippo, MD;  Location: [facility];  Service: Endoscopy;  Laterality: N/A;  pyloric channel dialtaion  . CHOLECYSTECTOMY    . ESOPHAGEAL DILATION N/A 03/03/2016   Procedure: ESOPHAGEAL DILATION;  Surgeon: Malissa Hippo, MD;  Location: [facility];  Service: Endoscopy;  Laterality: N/A;  . ESOPHAGEAL DILATION N/A 05/07/2016   Procedure: ESOPHAGEAL DILATION;  Surgeon: Malissa Hippo, MD;  Location: [facility];  Service: Endoscopy;  Laterality: N/A;  . ESOPHAGEAL DILATION  01/26/2018   Procedure: DILATION OF ANASTOMOTIC  STRICTURE;  Surgeon: Malissa Hippo, MD;  Location: [facility];  Service: Endoscopy;;  . ESOPHAGOGASTRODUODENOSCOPY N/A 03/03/2016   Procedure: ESOPHAGOGASTRODUODENOSCOPY (EGD);  Surgeon: Malissa Hippo, MD;  [facility];  Service: Endoscopy;  Laterality: N/A;  2:00  . ESOPHAGOGASTRODUODENOSCOPY N/A 05/07/2016   Procedure: ESOPHAGOGASTRODUODENOSCOPY (EGD);  Surgeon: Malissa Hippo, MD;  [facility];  Service: Endoscopy;  Laterality: N/A;  855 - moved to 6/2 @ 10:15 - Ann notified pt  . ESOPHAGOGASTRODUODENOSCOPY N/A 12/28/2017   Procedure: ESOPHAGOGASTRODUODENOSCOPY (EGD) with stricture dilation;  Surgeon: Malissa Hippo, MD;  [facility];  Service: Endoscopy;  Laterality: N/A;  . ESOPHAGOGASTRODUODENOSCOPY N/A 01/26/2018   Procedure: ESOPHAGOGASTRODUODENOSCOPY (EGD);  Surgeon: Malissa Hippo, MD;  [facility];  Service: Endoscopy;  Laterality: N/A;  255  . ESOPHAGOGASTRODUODENOSCOPY (EGD) WITH PROPOFOL N/A 03/05/2016   Procedure: ESOPHAGOGASTRODUODENOSCOPY (EGD) WITH PROPOFOL Anastomotic stricture dilation ;  Surgeon: Malissa Hippo, MD;  [facility]: [facility];  Service: Endoscopy;  Laterality: N/A;  to be done in OR under fluoro  . STOMACH SURGERY     Social History   Socioeconomic History  . Marital status: Widowed    Spouse name: Not on file  . Number of children: Not on file  . Years of education: Not on file  . Highest education level: Not on file  Occupational History  . Not on file  Social Needs  . Financial resource strain: Not on file  . Food insecurity:    Worry: Not on file    Inability: Not on file  .  Transportation needs:    Medical: Not on file    Non-medical: Not on file  Tobacco Use  . Smoking status: Current Every Day Smoker    Packs/day: 1.50    Years: 20.00    Pack years: 30.00    Types: Cigarettes  . Smokeless tobacco: Never Used  Substance and Sexual Activity  . Alcohol use: Yes    Alcohol/week: 7.2 oz    Types: 12 Cans of beer per week    Comment: (beer)  3-4/WEEK    LAST BEER ON 8/2 (2)  . Drug use: No  . Sexual activity: Yes     Birth control/protection: None  Lifestyle  . Physical activity:    Days per week: Not on file    Minutes per session: Not on file  . Stress: Not on file  Relationships  . Social connections:    Talks on phone: Not on file    Gets together: Not on file    Attends religious service: Not on file    Active member of club or organization: Not on file    Attends meetings of clubs or organizations: Not on file    Relationship status: Not on file  Other Topics Concern  . Not on file  Social History Narrative  . Not on file   Family History  Problem Relation Age of Onset  . Alzheimer's disease Mother   . COPD Mother   . Throat cancer Father   . Breast cancer Sister    Allergies  Allergen Reactions  . Penicillins Itching, Swelling and Other (See Comments)    Tongue swells Has patient had a PCN reaction causing immediate rash, facial/tongue/throat swelling, SOB or lightheadedness with hypotension: Yes, had a rash and tongue swelled up. No SOB and lightheadedness. Has patient had a PCN reaction causing severe rash involving mucus membranes or skin necrosis: No Has patient had a PCN reaction that required hospitalization: No Has patient had a PCN reaction occurring within the last 10 years: No If all of the above answers are "NO", then may procee   Prior to Admission medications   Medication Sig Start Date End Date Taking? Authorizing Provider  albuterol (PROVENTIL HFA;VENTOLIN HFA) 108 (90 Base) MCG/ACT inhaler Inhale 2 puffs into the lungs every 6 (six) hours as needed for wheezing or shortness of breath.    Yes [provider]  celecoxib (CELEBREX) 100 MG capsule Take 100 mg by mouth daily.  06/13/18  Yes [provider]  citalopram (CELEXA) 20 MG tablet Take 20 mg by mouth daily. 06/23/18  Yes [provider]  DULERA 200-5 MCG/ACT AERO Inhale 1 puff into the lungs 2 (two) times daily.  12/22/17  Yes [provider]  feeding supplement, ENSURE ENLIVE,  (ENSURE ENLIVE) LIQD Take 237 mLs by mouth 3 (three) times daily between meals. 12/30/17  Yes Shon Hale, MD  Multiple Vitamin (MULTIVITAMIN WITH MINERALS) TABS tablet Take 1 tablet by mouth daily. 12/31/17  Yes Emokpae, Courage, MD  neomycin-bacitracin-polymyxin (NEOSPORIN) ointment Apply 1 application topically as needed for wound care.   Yes [provider]  SANTYL ointment Apply 1 application topically daily.  06/13/18  Yes [provider]  tetrahydrozoline 0.05 % ophthalmic solution Place 1 drop into both eyes daily as needed (dry eyes).   Yes [provider]  traZODone (DESYREL) 100 MG tablet Take 1 tablet (100 mg total) by mouth at bedtime. 12/30/17  Yes Emokpae, Courage, MD  pantoprazole (PROTONIX) 40 MG tablet Take 1 tablet (  40 mg total) by mouth daily before breakfast. Patient not taking: Reported on 07/08/2018 01/26/18   Malissa Hippo, MD  sulfamethoxazole-trimethoprim (BACTRIM DS,SEPTRA DS) 800-160 MG tablet Take 1 tablet by mouth 2 (two) times daily. for 10 days starting on 06/21/2018 06/21/18   [provider]   Ct Hip Right Wo Contrast  Result Date: 07/08/2018 CLINICAL DATA:  Right hip fracture after fall while running from a CT. EXAM: CT OF THE RIGHT HIP WITHOUT CONTRAST TECHNIQUE: Multidetector CT imaging of the right hip was performed according to the standard protocol. Multiplanar CT image reconstructions were also generated. COMPARISON:  Radiographs from 07/08/2018 FINDINGS: Bones/Joint/Cartilage There is an acute inter trochanteric fracture of the right femur with slight impaction of the medial femoral neck upon the inter trochanter. Fracture also extends and slightly undermines the greater trochanter. Joint space narrowing of the right hip is seen without joint dislocation. Spurring is identified across the femoral head-neck juncture. The included pubic rami and pubic symphysis appear intact. Ligaments Suboptimally assessed by CT. Muscles and  Tendons No intramuscular hemorrhage or atrophy. Soft tissues No focal soft tissue hematoma. IMPRESSION: Acute closed intertrochanteric fracture of the right femur with slight impaction of the medial aspect of the femoral head upon the trochanteric portion of the femur. No joint dislocation. Electronically Signed   By: Tollie Eth M.D.   On: 07/08/2018 18:59   Dg Hip Unilat With Pelvis 2-3 Views Right  Result Date: 07/08/2018 CLINICAL DATA:  Fall today outside, dodging a cat. Unable to straighten RIGHT leg. RIGHT hip pain. EXAM: DG HIP (WITH OR WITHOUT PELVIS) 2-3V RIGHT COMPARISON:  None. FINDINGS: The RIGHT hip appears foreshortened, accentuated by patient positioning. There is a linear lucency traversing the base of the femoral neck and mild irregularity of the lesser trochanter. Findings are consistent with impacted basicervical/intertrochanteric fracture. There is no dislocation. The LEFT hip appears intact. Bones appear radiolucent. IMPRESSION: Impacted fracture of the RIGHT femoral neck, possibly involving the lesser trochanter. Greater trochanter involvement cannot be excluded. Consider CT if needed for further characterization. Electronically Signed   By: Norva Pavlov M.D.   On: 07/08/2018 17:10    Positive ROS: All other systems have been reviewed and were otherwise negative with the exception of those mentioned in the HPI and as above.  Physical Exam: General: Alert, no acute distress Cardiovascular: No pedal edema Respiratory: No cyanosis, no use of accessory musculature GI: No organomegaly, abdomen is soft and non-tender Skin: No lesions in the area of chief complaint Neurologic: Sensation intact distally Psychiatric: Patient is competent for consent with normal mood and affect Lymphatic: No axillary or cervical lymphadenopathy  MUSCULOSKELETAL:  Right lower externally:  Pretibial region of the distal tibia there is a half dollar sized circular partial-thickness ulceration.  This  is nicely granulating.  There is no drainage otherwise she is intact sensation light touch in the foot but does endorse decreased sensation there in the plantar aspect.,  Motor is intact. 2+ pulses.   Assessment: Right hip intertrochanteric fracture.  Plan: -Plan for intramedullary nail placement today for unstable right hip fracture.  We discussed the risk and benefits of this procedure at length.  She has provided informed consent. -She will return to the medicine service postoperatively for perioperative care. -We will plan for weightbearing as tolerated with walker and occupational and physical therapy postoperatively.  We will plan for twice daily 81 mg aspirin postoperatively for DVT prophylaxis.    Yolonda Kida, MD Cell [phone]  07/09/2018 9:43 AM

## 2018-07-10 ENCOUNTER — Encounter (HOSPITAL_COMMUNITY): Payer: Self-pay | Admitting: Orthopedic Surgery

## 2018-07-10 DIAGNOSIS — S72001A Fracture of unspecified part of neck of right femur, initial encounter for closed fracture: Secondary | ICD-10-CM

## 2018-07-10 DIAGNOSIS — E876 Hypokalemia: Secondary | ICD-10-CM

## 2018-07-10 DIAGNOSIS — E43 Unspecified severe protein-calorie malnutrition: Secondary | ICD-10-CM

## 2018-07-10 DIAGNOSIS — L03115 Cellulitis of right lower limb: Secondary | ICD-10-CM

## 2018-07-10 DIAGNOSIS — L03116 Cellulitis of left lower limb: Secondary | ICD-10-CM

## 2018-07-10 DIAGNOSIS — J439 Emphysema, unspecified: Secondary | ICD-10-CM

## 2018-07-10 MED ORDER — AMMONIUM LACTATE 12 % EX LOTN
TOPICAL_LOTION | Freq: Every day | CUTANEOUS | Status: DC
  Administered 2018-07-10 – 2018-07-16 (×6): via TOPICAL
  Filled 2018-07-10 (×2): qty 225

## 2018-07-10 MED ORDER — COLLAGENASE 250 UNIT/GM EX OINT
TOPICAL_OINTMENT | Freq: Every day | CUTANEOUS | Status: DC
  Administered 2018-07-10 – 2018-07-16 (×7): via TOPICAL
  Filled 2018-07-10: qty 30

## 2018-07-10 MED ORDER — OXYCODONE HCL 5 MG PO TABS
2.5000 mg | ORAL_TABLET | ORAL | Status: DC | PRN
  Administered 2018-07-10 – 2018-07-11 (×4): 5 mg via ORAL
  Filled 2018-07-10 (×4): qty 1

## 2018-07-10 MED ORDER — HYDROMORPHONE HCL 1 MG/ML IJ SOLN
0.5000 mg | INTRAMUSCULAR | Status: DC | PRN
  Administered 2018-07-10 – 2018-07-14 (×20): 0.5 mg via INTRAVENOUS
  Filled 2018-07-10 (×20): qty 1

## 2018-07-10 MED ORDER — SENNOSIDES-DOCUSATE SODIUM 8.6-50 MG PO TABS
1.0000 | ORAL_TABLET | Freq: Two times a day (BID) | ORAL | Status: DC
  Administered 2018-07-10 – 2018-07-14 (×4): 1 via ORAL
  Filled 2018-07-10 (×10): qty 1

## 2018-07-10 MED ORDER — ADULT MULTIVITAMIN W/MINERALS CH
1.0000 | ORAL_TABLET | Freq: Every day | ORAL | Status: DC
  Administered 2018-07-10 – 2018-07-15 (×3): 1 via ORAL
  Filled 2018-07-10 (×6): qty 1

## 2018-07-10 MED ORDER — POLYETHYLENE GLYCOL 3350 17 G PO PACK
17.0000 g | PACK | Freq: Every day | ORAL | Status: DC
  Administered 2018-07-10 – 2018-07-11 (×2): 17 g via ORAL
  Filled 2018-07-10 (×5): qty 1

## 2018-07-10 MED ORDER — BISACODYL 10 MG RE SUPP: 10.0000 mg | Freq: Every day | RECTAL | Status: DC | PRN

## 2018-07-10 MED ORDER — ASPIRIN EC 81 MG PO TBEC
81.0000 mg | DELAYED_RELEASE_TABLET | Freq: Two times a day (BID) | ORAL | Status: DC
  Administered 2018-07-10 – 2018-07-16 (×11): 81 mg via ORAL
  Filled 2018-07-10 (×12): qty 1

## 2018-07-10 NOTE — Progress Notes (Addendum)
Physical Therapy Evaluation Patient Details Name: Kristine Roberts MRN: [id number] DOB: 1956/02/26 Today's Date: 07/10/2018   History of Present Illness  Pt presents w closed R hip fx after falling down 3 steps trying to avoid stepping on her cat. Hx of chronic wounds in lower extremities, COPD, malnutrition, osteopenia.   Clinical Impression  Pt presents with above diagnosis. Pt presents with R hip pain due to surgery, general LE weakness, loss of functional mobility and balance. Session focused on assessing bed mobility and ambulation. Pt was mod assist to roll from supine to sitting EOB with VCs for hand placement. Pt ambulated from bed to the doorway of her room mod assist before sitting and being rolled back to her room in recliner. Pt relied  heavily on UE support on walker due to her being reluctant to put weight through R side due to pain/fear of pain. Pt lives with her boyfriend, Kristine Roberts, in a private, 1 level home with 3 stairs to enter. No railings. Kristine Roberts recently s/p surgery himself, but pt reports he is able to assist her. Pt has 2 dogs and 1-2 cats at home as well.  Pt would benefit from continued skilled PT to improve balance, strength and functional mobility to ensure her independence. Pt shows a good potential to d/c home once she is ambulating safely. Recommend follow up with [facility] to continue therapy after d/c.     Follow Up Recommendations Home health PT(I anticipate good progress; Still, boyfriend, Kristine Roberts recently had hip surgery, and Ms. Unrein must be at or close to Modified Independent to dc home; if progress is slow, we must consider SNF for rehab -- will update as needed)    Equipment Recommendations  Rolling walker with 5" wheels    Recommendations for Other Services       Precautions / Restrictions Precautions Precautions: None Restrictions Weight Bearing Restrictions: No      Mobility  Bed Mobility Overal bed mobility: Needs Assistance Bed Mobility: Rolling Rolling:  Mod assist         General bed mobility comments: Pt has pain moving legs to edge of bed, required assistance. Pt needs VC for hand placement with rolling to sitting EOB, required assistance to elevate trunk to sitting.   Transfers Overall transfer level: Needs assistance Equipment used: Rolling walker (2 wheeled) Transfers: Sit to/from Stand Sit to Stand: Mod assist         General transfer comment: Pt relunctant to move feet back in sit>stand transfer. Holding her weight back, requiring mod assist to get her standing. Pt also very reliant on UE through walker while standing   Ambulation/Gait Ambulation/Gait assistance: Mod assist Gait Distance (Feet): 15 Feet Assistive device: Rolling walker (2 wheeled) Gait Pattern/deviations: Step-to pattern;Decreased step length - right;Decreased step length - left;Decreased dorsiflexion - right;Decreased dorsiflexion - left;Decreased weight shift to right     General Gait Details: Pt relies on putting weight through UE on walker. Uses her toes to progress her R foot while lifting her heel. Pt reluctant to shift weight to R when trying to move her L foot forward in this step to pattern, required PT assistance to move L foot through often.   Stairs            Wheelchair Mobility    Modified Rankin (Stroke Patients Only)       Balance Overall balance assessment: Needs assistance Sitting-balance support: Bilateral upper extremity supported;Feet supported Sitting balance-Leahy Scale: Poor     Standing balance support: Bilateral  upper extremity supported;During functional activity Standing balance-Leahy Scale: Poor Standing balance comment: Too reliant on holding onto walker                              Pertinent Vitals/Pain Pain Assessment: 0-10 Pain Score: 8  Pain Location: R hip Pain Descriptors / Indicators: Grimacing;Sharp Pain Intervention(s): Premedicated before session;Monitored during session;Limited  activity within patient's tolerance    Home Living Family/patient expects to be discharged to:: Private residence Living Arrangements: Spouse/significant other Available Help at Discharge: Family Type of Home: House Home Access: Stairs to enter Entrance Stairs-Rails: None Secretary/administrator of Steps: 3 Home Layout: One level        Prior Function Level of Independence: Independent               Hand Dominance        Extremity/Trunk Assessment   Upper Extremity Assessment Upper Extremity Assessment: Overall WFL for tasks assessed    Lower Extremity Assessment Lower Extremity Assessment: RLE deficits/detail RLE Deficits / Details: Weary to put weight through R leg post surgery  RLE Sensation: WNL RLE Coordination: decreased gross motor;decreased fine motor    Cervical / Trunk Assessment Cervical / Trunk Assessment: Kyphotic  Communication   Communication: No difficulties  Cognition Arousal/Alertness: Awake/alert Behavior During Therapy: WFL for tasks assessed/performed Overall Cognitive Status: Within Functional Limits for tasks assessed                                        General Comments  Pt is very willing to work with PT. Very nice woman.     Exercises General Exercises - Lower Extremity Quad Sets: AROM;Both;5 reps Heel Slides: PROM;Right;5 reps   Assessment/Plan    PT Assessment Patient needs continued PT services  PT Problem List Decreased strength;Decreased range of motion;Decreased activity tolerance;Decreased balance;Decreased mobility;Decreased coordination;Decreased skin integrity       PT Treatment Interventions Gait training;Stair training;Functional mobility training;Therapeutic exercise;Balance training    PT Goals (Current goals can be found in the Care Plan section)  Acute Rehab PT Goals Patient Stated Goal: "go home" PT Goal Formulation: With patient Time For Goal Achievement: 07/24/18 Potential to Achieve  Goals: Good    Frequency Min 5X/week   Barriers to discharge        Co-evaluation               AM-PAC PT "6 Clicks" Daily Activity  Outcome Measure Difficulty turning over in bed (including adjusting bedclothes, sheets and blankets)?: A Lot Difficulty moving from lying on back to sitting on the side of the bed? : A Lot Difficulty sitting down on and standing up from a chair with arms (e.g., wheelchair, bedside commode, etc,.)?: A Lot Help needed moving to and from a bed to chair (including a wheelchair)?: A Lot Help needed walking in hospital room?: A Lot Help needed climbing 3-5 steps with a railing? : Total 6 Click Score: 11    End of Session Equipment Utilized During Treatment: Gait belt Activity Tolerance: Patient limited by pain Patient left: in chair;with call bell/phone within reach;with chair alarm set Nurse Communication: Mobility status PT Visit Diagnosis: Unsteadiness on feet (R26.81);Other abnormalities of gait and mobility (R26.89);Pain Pain - Right/Left: Right Pain - part of body: Hip    Time: 5750-5183 PT Time Calculation (min) (ACUTE ONLY): 50 min  Charges:   PT Evaluation $PT Eval Moderate Complexity: 1 Mod PT Treatments $Gait Training: 8-22 mins $Therapeutic Activity: 8-22 mins        Clemon Chambers, [place] Office #[phone]   Clemon Chambers 07/10/2018, 12:11 PM   I was present during the PT session and agree with patient status and findings as outlined by Lorriane Shire, SPT.  Van Clines, [place]  Acute Rehabilitation Services Pager [phone] Office [phone]  The licensed clinician was present and actively directing the care throughout the session at all times.

## 2018-07-10 NOTE — Progress Notes (Addendum)
PT Cancellation Note  Patient Details Name: Kristine Roberts MRN: [id number] DOB: 1956/01/29   Cancelled Treatment:    Reason Eval/Treat Not Completed: Active bedrest order. Will need increased activity orders to proceed with PT eval.  Left voicemail for Dr. Aundria Rud asking to lift bedrest orders.   Clemon Chambers, SPT Office #[phone]  Clemon Chambers 07/10/2018, 8:18 AM

## 2018-07-10 NOTE — Progress Notes (Signed)
Initial Nutrition Assessment  DOCUMENTATION CODES:   Severe malnutrition in context of chronic illness, Underweight  INTERVENTION:   Ensure Enlive po BID, each supplement provides 350 kcal and 20 grams of protein  Encouraged smaller, more frequent meals. Snacks added TID between meals  Add MVI with minerals daily   NUTRITION DIAGNOSIS:   Severe Malnutrition related to chronic illness(COPD) as evidenced by severe fat depletion, severe muscle depletion.  GOAL:   Patient will meet greater than or equal to 90% of their needs  MONITOR:   PO intake, Supplement acceptance, Labs, Weight trends  REASON FOR ASSESSMENT:   Consult    ASSESSMENT:    62 yo female admitted with closed right hip fracture after falling down 3 steps trying to avoid stepping on her cat. Pt with hx of malnutrition, COPD, osteopenia, PUD, dysphagia with hx of esophageal dilation and pyloric dilation. Hx of gastrodudenal anastamotic stricture with hx of billroth 1   Pt in a lot of pain on visit today; pt acknowledges poor appetite related to pain.  Pt reports good appetite at baseline; eating 2 good meals per day, snacking throughout the day and drinking 1-2 Boost/Ensure per day.  Pt eats breakfast (usually eggs and bacon with buscuit) and a meal at 3pm that varies daily.   Pt reports she weighed 120 pounds 2 years ago; 36% wt loss in 2 years (per chart review from previous RD notes, likely this was 5 years ago) . Pt reports weight loss started due to the dysphagia related to esophageal stricture and pyloric stricture which was treated with dilation. Pt denies swallowing problems at this time. Pt does not appear to have experienced any significant wt loss recently but has not been able to regain any weight.   Labs: reviewed Meds: NS with Kcl @ 100 ml/hr  NUTRITION - FOCUSED PHYSICAL EXAM:    Most Recent Value  Orbital Region  Moderate depletion  Upper Arm Region  Severe depletion  Thoracic and Lumbar  Region  Severe depletion  Buccal Region  Moderate depletion  Temple Region  Moderate depletion  Clavicle Bone Region  Severe depletion  Clavicle and Acromion Bone Region  Severe depletion  Scapular Bone Region  Severe depletion  Dorsal Hand  Moderate depletion  Patellar Region  Severe depletion  Anterior Thigh Region  Severe depletion  Posterior Calf Region  Severe depletion  Edema (RD Assessment)  None       Diet Order:   Diet Order           Diet regular Room service appropriate? Yes; Fluid consistency: Thin  Diet effective now          EDUCATION NEEDS:   Education needs have been addressed  Skin:  Skin Assessment: Skin Integrity Issues: Skin Integrity Issues:: Other (Comment) Other: b/l LE non-healing wounds (not pressure related)  Last BM:  8/4  Height:   Ht Readings from Last 1 Encounters:  07/08/18 5\' 2"  (1.575 m)    Weight:   Wt Readings from Last 1 Encounters:  07/08/18 77 lb 6.1 oz (35.1 kg)    Ideal Body Weight:  50 kg  BMI:  Body mass index is 14.15 kg/m.  Estimated Nutritional Needs:   Kcal:  1300-1500 kcals   Protein:  65-75 g  Fluid:  >/= 1.3 L  09/07/18 MS, RD, LDN, CNSC [phone] Pager  [phone] Weekend/On-Call Pager

## 2018-07-10 NOTE — Progress Notes (Signed)
Physical Therapy Note  Lorriane Shire, SPT, discussed activity orders with Dr. Aundria Rud, who confirmed that she can be off bedrest and up as tolerated.  Van Clines, [place]  Acute Rehabilitation Services Pager [phone] Office [phone]

## 2018-07-10 NOTE — Evaluation (Addendum)
Occupational Therapy Evaluation Patient Details Name: Kristine Roberts MRN: [id number] DOB: Apr 18, 1956 Today's Date: 07/10/2018    History of Present Illness Pt presents s/p R IM nail hip fx after falling down 3 steps while avoiding a cat. Hx of chronic wounds in lower extremities, COPD, malnutrition, osteopenia.    Clinical Impression   Verbal ok from MD to work with pt per PT. Pt with decline in function and safety with ADLs and ADL mobility wth decreased strength, balance and endurance. Pt limited by pain/anticipatory pain and is very anxious about mobility. Pt required increased time using RW to complete ADL mobility and functional tasks due to slow, guarded movements. Pt required extensive assist with LB selfcare at this time. Pt would benefit form acute OT services to address impairments to maximize level of function and safety. Pt wants to return home with [facility] buy may need [facility] SNF rehab if not progressing    Follow Up Recommendations  Home health OT;Supervision - Intermittent(may need SNF if not progressing)    Equipment Recommendations  3 in 1 bedside commode;Other (comment)(reacher)    Recommendations for Other Services       Precautions / Restrictions Precautions Precautions: None Restrictions Weight Bearing Restrictions: No      Mobility Bed Mobility Overal bed mobility: Needs Assistance Bed Mobility: Supine to Sit;Sit to Supine Rolling: Mod assist   Supine to sit: Mod assist Sit to supine: Mod assist   General bed mobility comments: Mod A with LEs and elevatig trunk. Increased pain with LE movement  Transfers Overall transfer level: Needs assistance Equipment used: Rolling walker (2 wheeled) Transfers: Sit to/from Stand Sit to Stand: Mod assist         General transfer comment: Pt required increased time due to pain, anxious    Balance Overall balance assessment: Needs assistance Sitting-balance support: Bilateral upper extremity supported;Feet  supported Sitting balance-Leahy Scale: Poor     Standing balance support: Bilateral upper extremity supported;During functional activity Standing balance-Leahy Scale: Poor Standing balance comment: Too reliant on holding onto walker                            ADL either performed or assessed with clinical judgement   ADL Overall ADL's : Needs assistance/impaired Eating/Feeding: Set up;Sitting;Bed level   Grooming: Wash/dry hands;Wash/dry face;Sitting;Min guard   Upper Body Bathing: Min guard;Sitting   Lower Body Bathing: Maximal assistance   Upper Body Dressing : Min guard;Sitting   Lower Body Dressing: Maximal assistance   Toilet Transfer: Moderate assistance;Ambulation;RW;BSC;Cueing for safety   Toileting- Clothing Manipulation and Hygiene: Moderate assistance;Sit to/from stand       Functional mobility during ADLs: Moderate assistance;Rolling walker;Cueing for safety General ADL Comments: pt very anxious, fearful     Vision Baseline Vision/History: Wears glasses Wears Glasses: Reading only Patient Visual Report: No change from baseline       Perception     Praxis      Pertinent Vitals/Pain Pain Assessment: Faces Pain Score: 8  Faces Pain Scale: Hurts whole lot Pain Location: R hip Pain Descriptors / Indicators: Grimacing;Sharp;Moaning Pain Intervention(s): Limited activity within patient's tolerance;Monitored during session;Premedicated before session;Repositioned     Hand Dominance Right   Extremity/Trunk Assessment Upper Extremity Assessment Upper Extremity Assessment: Generalized weakness   Lower Extremity Assessment Lower Extremity Assessment: Defer to PT evaluation RLE Deficits / Details: Weary to put weight through R leg post surgery  RLE Sensation: WNL RLE Coordination: decreased gross motor;decreased fine  motor   Cervical / Trunk Assessment Cervical / Trunk Assessment: Kyphotic   Communication Communication Communication: No  difficulties   Cognition Arousal/Alertness: Awake/alert Behavior During Therapy: WFL for tasks assessed/performed;Anxious Overall Cognitive Status: Within Functional Limits for tasks assessed                                     General Comments       Exercises Exercises: General Lower Extremity General Exercises - Lower Extremity Quad Sets: AROM;Both;5 reps Heel Slides: PROM;Right;5 reps   Shoulder Instructions      Home Living Family/patient expects to be discharged to:: Private residence Living Arrangements: Spouse/significant other Available Help at Discharge: Family Type of Home: House Home Access: Stairs to enter Secretary/administrator of Steps: 3 Entrance Stairs-Rails: None Home Layout: Other (Comment)     Bathroom Shower/Tub: Producer, television/film/video: Handicapped height     Home Equipment: Grab bars - tub/shower          Prior Functioning/Environment Level of Independence: Independent                 OT Problem List: Decreased strength;Decreased activity tolerance;Decreased knowledge of use of DME or AE;Impaired balance (sitting and/or standing);Pain      OT Treatment/Interventions: Self-care/ADL training;Therapeutic exercise;DME and/or AE instruction;Therapeutic activities;Patient/family education    OT Goals(Current goals can be found in the care plan section) Acute Rehab OT Goals Patient Stated Goal: go home OT Goal Formulation: With patient Time For Goal Achievement: 07/24/18 Potential to Achieve Goals: Good ADL Goals Pt Will Perform Grooming: with min guard assist;with supervision;with set-up;standing Pt Will Perform Upper Body Bathing: with supervision;with set-up;sitting Pt Will Perform Lower Body Bathing: with mod assist;sitting/lateral leans;sit to/from stand;with caregiver independent in assisting Pt Will Perform Upper Body Dressing: with supervision;with set-up;sitting Pt Will Perform Lower Body Dressing: with  mod assist;sitting/lateral leans;sit to/from stand;with caregiver independent in assisting Pt Will Transfer to Toilet: with min assist;with min guard assist;ambulating;regular height toilet;bedside commode;grab bars Pt Will Perform Toileting - Clothing Manipulation and hygiene: with min assist;sit to/from stand;with caregiver independent in assisting Pt Will Perform Tub/Shower Transfer: with min assist;ambulating;rolling walker;3 in 1;shower seat;grab bars;with caregiver independent in assisting  OT Frequency: Min 2X/week   Barriers to D/C:    pt will have 24/7 assist from significant other; may need SNF if not progressing        Co-evaluation              AM-PAC PT "6 Clicks" Daily Activity     Outcome Measure Help from another person eating meals?: None Help from another person taking care of personal grooming?: A Little Help from another person toileting, which includes using toliet, bedpan, or urinal?: A Lot Help from another person bathing (including washing, rinsing, drying)?: A Lot Help from another person to put on and taking off regular upper body clothing?: A Little Help from another person to put on and taking off regular lower body clothing?: A Lot 6 Click Score: 16   End of Session Equipment Utilized During Treatment: Gait belt;Rolling walker;Other (comment)(BSC)  Activity Tolerance: Patient tolerated treatment well Patient left: in bed;with call bell/phone within reach  OT Visit Diagnosis: Unsteadiness on feet (R26.81);Other abnormalities of gait and mobility (R26.89);Muscle weakness (generalized) (M62.81);History of falling (Z91.81);Pain Pain - Right/Left: Right Pain - part of body: Hip  Time: 7591-6384 OT Time Calculation (min): 21 min Charges:  OT General Charges $OT Visit: 1 Visit OT Evaluation $OT Eval Low Complexity: 1 Low    [name] 07/10/2018, 2:35 PM

## 2018-07-10 NOTE — Progress Notes (Signed)
PROGRESS NOTE  Kristine Roberts  JIR:[id number] DOB: 07-Mar-1956 DOA: 07/08/2018 PCP: Health, [facility]   Brief Narrative: Kristine Roberts is a 62 y.o. female with a history of COPD, malnurtition   Assessment & Plan: Principal Problem:   Closed right hip fracture (HCC) Active Problems:   COPD (chronic obstructive pulmonary disease) (HCC)   Protein-calorie malnutrition, severe   Hypokalemia   Cellulitis of both lower extremities  Acute, closed, intertrochanteric fracture of right hip: s/p IM implant 8/4 by Dr. Aundria Rud.  - WBAT RLE - Pain control as ordered, requiring frequent IV narcotic doses, will change to PO.  - PT/OT, hopeful for progress to DC home, possibly to SNF.   Acute blood loss anemia due to surgery:  - Monitor in AM, has gotten T&S, transfuse if hgb < 7.  Malnutrition: BMI is 14.  - Dietitian assistance appreciated. Protein supplementation as tolerated  Chronic LE wounds with cellulitis: Cellulitis is improving.  - Continue empiric ancef for nonpurulent SSTI. PCN allergy noted, though pt is tolerating 1st gen ceph. - Wound care RN has been consulted, will follow local wound care recommendations. Follow up with wound care center as outpatient.   - Dietitian consulted as above  Hypokalemia: Resolved. Will DC IVF's and recheck in AM  COPD: No exacerbation - BDs, no steroids  Depression:  - Continue SSRI, qHS trazodone  DVT prophylaxis: ASA BID, SCDs Code Status: Full Family Communication: Boyfriend at bedside Disposition Plan: Home vs. SNF based on progress with PT  Consultants:   Orthopedics, Dr. Aundria Rud  Procedures:   07/09/18 INTRAMEDULLARY (IM) NAIL INTERTROCHANTRIC Yolonda Kida, MD   Antimicrobials:  Ancef   Subjective: Pain is controlled at rest, ready to try to rehabilitate. No chest pain, dyspnea, abd pain, N/V/D. Does feel weak, fatigued. No bleeding noted.  Objective: Vitals:   07/09/18 2017 07/10/18 0510 07/10/18 0715  07/10/18 0932  BP: 121/65 113/75  110/68  Pulse: (!) 55 68  66  Resp:  12  18  Temp: (!) 97.5 F (36.4 C) 98.2 F (36.8 C)  99.3 F (37.4 C)  TempSrc: Oral Oral  Oral  SpO2: 100% 98% 96% 100%  Weight:      Height:        Intake/Output Summary (Last 24 hours) at 07/10/2018 1641 Last data filed at 07/10/2018 1638 Gross per 24 hour  Intake 4367.99 ml  Output 0 ml  Net 4367.99 ml   Filed Weights   07/08/18 1626 07/08/18 2328  Weight: 31.8 kg (70 lb) 35.1 kg (77 lb 6.1 oz)    Gen: Thin female in no distress Pulm: Non-labored breathing. Clear to auscultation bilaterally.  CV: Regular rate and rhythm. No murmur, rub, or gallop. No JVD, no pedal edema. GI: Abdomen soft, non-tender, non-distended, with normoactive bowel sounds. No organomegaly or masses felt. Ext: Warm, no deformities. right thigh compartment soft, distally NVI. Skin: Post op dressing in place on lateral right thigh/hip, otherwise no rashes, lesions or ulcers Neuro: Alert and oriented. No focal neurological deficits. Psych: Judgement and insight appear normal. Mood & affect appropriate.   Data Reviewed: I have personally reviewed following labs and imaging studies  CBC: Recent Labs  Lab 07/08/18 1710 07/09/18 0404  WBC 10.6* 5.6  NEUTROABS 9.0*  --   HGB 9.2* 7.6*  HCT 30.6* 26.5*  MCV 87.2 90.1  PLT 349 292   Basic Metabolic Panel: Recent Labs  Lab 07/08/18 1710 07/09/18 0633  NA 136 139  K 3.0* 4.5  CL  104 110  CO2 24 22  GLUCOSE 89 73  BUN 13 9  CREATININE 0.72 0.55  CALCIUM 8.5* 8.0*  MG 2.1  --    GFR: Estimated Creatinine Clearance: 40.4 mL/min (by C-G formula based on SCr of 0.55 mg/dL). Liver Function Tests: No results for input(s): AST, ALT, ALKPHOS, BILITOT, PROT, ALBUMIN in the last 168 hours. No results for input(s): LIPASE, AMYLASE in the last 168 hours. No results for input(s): AMMONIA in the last 168 hours. Coagulation Profile: Recent Labs  Lab 07/08/18 1710  INR 0.95    Cardiac Enzymes: No results for input(s): CKTOTAL, CKMB, CKMBINDEX, TROPONINI in the last 168 hours. BNP (last 3 results) No results for input(s): PROBNP in the last 8760 hours. HbA1C: No results for input(s): HGBA1C in the last 72 hours. CBG: No results for input(s): GLUCAP in the last 168 hours. Lipid Profile: No results for input(s): CHOL, HDL, LDLCALC, TRIG, CHOLHDL, LDLDIRECT in the last 72 hours. Thyroid Function Tests: No results for input(s): TSH, T4TOTAL, FREET4, T3FREE, THYROIDAB in the last 72 hours. Anemia Panel: No results for input(s): VITAMINB12, FOLATE, FERRITIN, TIBC, IRON, RETICCTPCT in the last 72 hours. Urine analysis:    Component Value Date/Time   COLORURINE YELLOW 12/26/2017 1006   APPEARANCEUR CLEAR 12/26/2017 1006   LABSPEC 1.026 12/26/2017 1006   PHURINE 5.0 12/26/2017 1006   GLUCOSEU NEGATIVE 12/26/2017 1006   HGBUR NEGATIVE 12/26/2017 1006   BILIRUBINUR NEGATIVE 12/26/2017 1006   KETONESUR 5 (A) 12/26/2017 1006   PROTEINUR NEGATIVE 12/26/2017 1006   NITRITE NEGATIVE 12/26/2017 1006   LEUKOCYTESUR NEGATIVE 12/26/2017 1006   Recent Results (from the past 240 hour(s))  MRSA PCR Screening     Status: None   Collection Time: 07/08/18  8:00 PM  Result Value Ref Range Status   MRSA by PCR NEGATIVE NEGATIVE Final    Comment:        The GeneXpert MRSA Assay (FDA approved for NASAL specimens only), is one component of a comprehensive MRSA colonization surveillance program. It is not intended to diagnose MRSA infection nor to guide or monitor treatment for MRSA infections. Performed at [facility], [address] [place], [place] [address]       Radiology Studies: Ct Hip Right Wo Contrast  Result Date: 07/08/2018 CLINICAL DATA:  Right hip fracture after fall while running from a CT. EXAM: CT OF THE RIGHT HIP WITHOUT CONTRAST TECHNIQUE: Multidetector CT imaging of the right hip was performed according to the standard protocol. Multiplanar CT  image reconstructions were also generated. COMPARISON:  Radiographs from 07/08/2018 FINDINGS: Bones/Joint/Cartilage There is an acute inter trochanteric fracture of the right femur with slight impaction of the medial femoral neck upon the inter trochanter. Fracture also extends and slightly undermines the greater trochanter. Joint space narrowing of the right hip is seen without joint dislocation. Spurring is identified across the femoral head-neck juncture. The included pubic rami and pubic symphysis appear intact. Ligaments Suboptimally assessed by CT. Muscles and Tendons No intramuscular hemorrhage or atrophy. Soft tissues No focal soft tissue hematoma. IMPRESSION: Acute closed intertrochanteric fracture of the right femur with slight impaction of the medial aspect of the femoral head upon the trochanteric portion of the femur. No joint dislocation. Electronically Signed   By: Tollie Eth M.D.   On: 07/08/2018 18:59   Dg C-arm 1-60 Min  Result Date: 07/09/2018 CLINICAL DATA:  Right hip fixation EXAM: DG C-ARM 61-120 MIN; OPERATIVE RIGHT HIP WITH PELVIS COMPARISON:  None. FLUOROSCOPY TIME:  Radiation Exposure Index (as provided by the fluoroscopic device): Not available If the device does not provide the exposure index: Fluoroscopy Time:  41 seconds Number of Acquired Images:  2 FINDINGS: Proximal medullary rod is noted with fixation screw traversing the femoral neck. The fracture fragments are in near anatomic alignment. IMPRESSION: ORIF of proximal right femoral fracture. Electronically Signed   By: Alcide Clever M.D.   On: 07/09/2018 15:07   Dg Hip Operative Unilat W Or W/o Pelvis Right  Result Date: 07/09/2018 CLINICAL DATA:  Right hip fixation EXAM: DG C-ARM 61-120 MIN; OPERATIVE RIGHT HIP WITH PELVIS COMPARISON:  None. FLUOROSCOPY TIME:  Radiation Exposure Index (as provided by the fluoroscopic device): Not available If the device does not provide the exposure index: Fluoroscopy Time:  41 seconds Number  of Acquired Images:  2 FINDINGS: Proximal medullary rod is noted with fixation screw traversing the femoral neck. The fracture fragments are in near anatomic alignment. IMPRESSION: ORIF of proximal right femoral fracture. Electronically Signed   By: Alcide Clever M.D.   On: 07/09/2018 15:07   Dg Hip Unilat With Pelvis 2-3 Views Right  Result Date: 07/08/2018 CLINICAL DATA:  Fall today outside, dodging a cat. Unable to straighten RIGHT leg. RIGHT hip pain. EXAM: DG HIP (WITH OR WITHOUT PELVIS) 2-3V RIGHT COMPARISON:  None. FINDINGS: The RIGHT hip appears foreshortened, accentuated by patient positioning. There is a linear lucency traversing the base of the femoral neck and mild irregularity of the lesser trochanter. Findings are consistent with impacted basicervical/intertrochanteric fracture. There is no dislocation. The LEFT hip appears intact. Bones appear radiolucent. IMPRESSION: Impacted fracture of the RIGHT femoral neck, possibly involving the lesser trochanter. Greater trochanter involvement cannot be excluded. Consider CT if needed for further characterization. Electronically Signed   By: Norva Pavlov M.D.   On: 07/08/2018 17:10    Scheduled Meds: . ammonium lactate   Topical Daily  . aspirin EC  81 mg Oral BID  . citalopram  20 mg Oral Daily  . collagenase   Topical Daily  . docusate sodium  100 mg Oral BID  . feeding supplement (ENSURE ENLIVE)  237 mL Oral TID BM  . mometasone-formoterol  1 puff Inhalation BID  . multivitamin with minerals  1 tablet Oral Daily  . potassium chloride  40 mEq Oral BID  . traZODone  100 mg Oral QHS   Continuous Infusions: . 0.9 % NaCl with KCl 40 mEq / L 100 mL/hr (07/10/18 1359)  .  ceFAZolin (ANCEF) IV 2 g (07/10/18 1446)     LOS: 2 days   Time spent: 25 minutes.  Tyrone Nine, MD [facility] [email or web address] Password [facility] 07/10/2018, 4:41 PM

## 2018-07-10 NOTE — Consult Note (Signed)
WOC Nurse wound consult note Reason for Consult:Chronic nonhealing wounds to bilateral lower legs   Enzymatic debrider to open wound right anterior lower leg Wound type:chronic nonhealing  Pressure Injury POA: NA Measurement:right leg nonintact:  1 cm x 1.5 cm x 0.2 cm  Bilateral scaly patches to lower legs Wound UUV:OZDGU red Drainage (amount, consistency, odor) minimal serosanguinous  No odor Periwound: dry chronic skin changes Dressing procedure/placement/frequency:Cleanse legs with soap and water and pat dry.  Apply Amlactin to bilateral lower legs. On right leg, nonintact lesion:  Apply Santyl to wound bed.  Cover with NS moist 2x2 and cover with foam dressing  Change daily Will not follow at this time.  Please re-consult if needed.  Maple Hudson RN BSN CWON Pager [phone]

## 2018-07-10 NOTE — Progress Notes (Signed)
   Subjective:  Patient reports pain as mild to moderate.  States she is feeling a little weak but otherwise no real pain to complain of.  She denies chest pain or shortness of breath.  Objective:   VITALS:   Vitals:   07/09/18 2017 07/10/18 0510 07/10/18 0715 07/10/18 0932  BP: 121/65 113/75  110/68  Pulse: (!) 55 68  66  Resp:  12  18  Temp: (!) 97.5 F (36.4 C) 98.2 F (36.8 C)  99.3 F (37.4 C)  TempSrc: Oral Oral  Oral  SpO2: 100% 98% 96% 100%  Weight:      Height:        Neurovascular intact Intact pulses distally Dorsiflexion/Plantar flexion intact Incision: dressing C/D/I Compartment soft   Lab Results  Component Value Date   WBC 5.6 07/09/2018   HGB 7.6 (L) 07/09/2018   HCT 26.5 (L) 07/09/2018   MCV 90.1 07/09/2018   PLT 292 07/09/2018   BMET    Component Value Date/Time   NA 139 07/09/2018 0633   K 4.5 07/09/2018 0633   CL 110 07/09/2018 0633   CO2 22 07/09/2018 0633   GLUCOSE 73 07/09/2018 0633   BUN 9 07/09/2018 0633   CREATININE 0.55 07/09/2018 0633   CALCIUM 8.0 (L) 07/09/2018 0633   GFRNONAA >60 07/09/2018 0633   GFRAA >60 07/09/2018 2482     Assessment/Plan: 1 Day Post-Op   Principal Problem:   Closed right hip fracture (HCC) Active Problems:   COPD (chronic obstructive pulmonary disease) (HCC)   Protein-calorie malnutrition, severe   Hypokalemia   Cellulitis of both lower extremities   Advance diet Up with therapy - Weightbearing as tolerated to the right lower extremity. - Maintained postoperative bandages for 1 week and then began once daily dry dressing changes. - Twice daily 81 mg aspirin for DVT prophylaxis, With SCDs.   Yolonda Kida 07/10/2018, 2:18 PM   Maryan Rued, MD [phone]

## 2018-07-10 NOTE — Progress Notes (Signed)
Dressings changed to BLE per order. No complications. Will continue to monitor.

## 2018-07-10 NOTE — Progress Notes (Signed)
OT Cancellation Note  Patient Details Name: Kristine Roberts MRN: [id number] DOB: 1955-12-22   Cancelled Treatment:    Reason Eval/Treat Not Completed: Active bedrest order  [name] 07/10/2018, 9:10 AM

## 2018-07-11 LAB — CBC
HEMATOCRIT: 31 % — AB (ref 36.0–46.0)
HEMOGLOBIN: 9.2 g/dL — AB (ref 12.0–15.0)
MCH: 26.1 pg (ref 26.0–34.0)
MCHC: 29.7 g/dL — ABNORMAL LOW (ref 30.0–36.0)
MCV: 87.8 fL (ref 78.0–100.0)
Platelets: 289 10*3/uL (ref 150–400)
RBC: 3.53 MIL/uL — ABNORMAL LOW (ref 3.87–5.11)
RDW: 18.3 % — AB (ref 11.5–15.5)
WBC: 7.8 10*3/uL (ref 4.0–10.5)

## 2018-07-11 LAB — BASIC METABOLIC PANEL
ANION GAP: 8 (ref 5–15)
BUN: <5 mg/dL — ABNORMAL LOW (ref 8–23)
CALCIUM: 8.3 mg/dL — AB (ref 8.9–10.3)
CHLORIDE: 104 mmol/L (ref 98–111)
CO2: 25 mmol/L (ref 22–32)
CREATININE: 0.53 mg/dL (ref 0.44–1.00)
GFR calc non Af Amer: >60 mL/min (ref >60)
Glucose, Bld: 86 mg/dL (ref 70–99)
Potassium: 4.8 mmol/L (ref 3.5–5.1)
SODIUM: 137 mmol/L (ref 135–145)

## 2018-07-11 MED ORDER — TRAMADOL HCL 50 MG PO TABS: 50.0000 mg | ORAL_TABLET | Freq: Four times a day (QID) | ORAL | Status: DC | PRN

## 2018-07-11 MED ORDER — IBUPROFEN 400 MG PO TABS
400.0000 mg | ORAL_TABLET | Freq: Three times a day (TID) | ORAL | Status: DC
  Administered 2018-07-11 – 2018-07-14 (×4): 400 mg via ORAL
  Filled 2018-07-11 (×4): qty 1

## 2018-07-11 MED ORDER — OXYCODONE HCL 5 MG PO TABS
10.0000 mg | ORAL_TABLET | ORAL | Status: DC | PRN
  Administered 2018-07-11 – 2018-07-12 (×3): 10 mg via ORAL
  Filled 2018-07-11 (×4): qty 2

## 2018-07-11 NOTE — Progress Notes (Signed)
   Subjective:  Patient reports pain as mild.  States she is feeling a little weak but otherwise no real pain to complain of.  She denies chest pain or shortness of breath.  Objective:   VITALS:   Vitals:   07/11/18 0517 07/11/18 0831 07/11/18 0939 07/11/18 1646  BP: (!) 154/85  110/72 116/72  Pulse: 65 64 66 67  Resp: 18 18 18 18   Temp: 99.4 F (37.4 C)  100.1 F (37.8 C) 100 F (37.8 C)  TempSrc: Oral  Oral Oral  SpO2: 98% 94% 99% 96%  Weight:      Height:        Neurovascular intact Intact pulses distally Dorsiflexion/Plantar flexion intact Incision: dressing C/D/I Compartment soft   Lab Results  Component Value Date   WBC 7.8 07/11/2018   HGB 9.2 (L) 07/11/2018   HCT 31.0 (L) 07/11/2018   MCV 87.8 07/11/2018   PLT 289 07/11/2018   BMET    Component Value Date/Time   NA 137 07/11/2018 0356   K 4.8 07/11/2018 0356   CL 104 07/11/2018 0356   CO2 25 07/11/2018 0356   GLUCOSE 86 07/11/2018 0356   BUN <5 (L) 07/11/2018 0356   CREATININE 0.53 07/11/2018 0356   CALCIUM 8.3 (L) 07/11/2018 0356   GFRNONAA >60 07/11/2018 0356   GFRAA >60 07/11/2018 0356     Assessment/Plan: 2 Days Post-Op   Principal Problem:   Closed right hip fracture (HCC) Active Problems:   COPD (chronic obstructive pulmonary disease) (HCC)   Protein-calorie malnutrition, severe   Hypokalemia   Cellulitis of both lower extremities   Advance diet Up with therapy - Weightbearing as tolerated to the right lower extremity. - Maintained postoperative bandages for 1 week and then began once daily dry dressing changes. - Twice daily 81 mg aspirin for DVT prophylaxis, With SCDs. - tylenol and ibuprofen with tramadol for breakthrough at dc - follow up with 09/10/2018 in 2 weeks   Aundria Rud 07/11/2018, 5:03 PM   09/10/2018, MD [phone]

## 2018-07-11 NOTE — Progress Notes (Addendum)
Occupational Therapy Treatment Patient Details Name: Kristine Roberts MRN: [id number] DOB: 03-28-1956 Today's Date: 07/11/2018    History of present illness Pt presents s/p R IM nail hip fx after falling down 3 steps while avoiding a cat. Hx of chronic wounds in lower extremities, COPD, malnutrition, osteopenia.    OT comments  Pt making progress with functional goals. Pt mobilizing with RW better today and was able to ambulate to bathroom for toileting tasks and stand at sink to wash and dry hands with assist. OT will continue to follow acutely  Follow Up Recommendations  Home health OT;Supervision - Still considering SNF recommendation if not progressing well enough    Equipment Recommendations  3 in 1 bedside commode;Other (comment)(reacher)    Recommendations for Other Services      Precautions / Restrictions Precautions Precautions: None Restrictions Weight Bearing Restrictions: No       Mobility Bed Mobility               General bed mobility comments: pt up in recliner upon arrival  Transfers Overall transfer level: Needs assistance Equipment used: Rolling walker (2 wheeled) Transfers: Sit to/from Stand Sit to Stand: Min assist         General transfer comment: Pt required increased time due to pain, anxious    Balance Overall balance assessment: Needs assistance Sitting-balance support: No upper extremity supported;Feet supported Sitting balance-Leahy Scale: Poor     Standing balance support: Bilateral upper extremity supported;During functional activity Standing balance-Leahy Scale: Poor                             ADL either performed or assessed with clinical judgement   ADL Overall ADL's : Needs assistance/impaired     Grooming: Wash/dry hands;Min guard;Standing   Upper Body Bathing: Min guard;Sitting Upper Body Bathing Details (indicate cue type and reason): simulated     Upper Body Dressing : Min guard;Sitting        Toilet Transfer: Ambulation;RW;Cueing for safety;Minimal assistance   Toileting- Clothing Manipulation and Hygiene: Sit to/from stand;Minimal assistance;Cueing for safety       Functional mobility during ADLs: Rolling walker;Cueing for safety;Minimal assistance       Vision Baseline Vision/History: Wears glasses Patient Visual Report: No change from baseline     Perception     Praxis      Cognition Arousal/Alertness: Awake/alert Behavior During Therapy: WFL for tasks assessed/performed Overall Cognitive Status: Within Functional Limits for tasks assessed                                          Exercises     Shoulder Instructions       General Comments      Pertinent Vitals/ Pain       Pain Assessment: 0-10 Pain Score: 6  Pain Location: R hip Pain Descriptors / Indicators: Grimacing;Sharp;Moaning Pain Intervention(s): Monitored during session;Premedicated before session;Repositioned  Home Living                                          Prior Functioning/Environment              Frequency  Min 2X/week        Progress Toward Goals  OT Goals(current goals  can now be found in the care plan section)  Progress towards OT goals: Progressing toward goals     Plan Discharge plan remains appropriate    Co-evaluation                 AM-PAC PT "6 Clicks" Daily Activity     Outcome Measure   Help from another person eating meals?: None Help from another person taking care of personal grooming?: A Little Help from another person toileting, which includes using toliet, bedpan, or urinal?: A Lot Help from another person bathing (including washing, rinsing, drying)?: A Lot Help from another person to put on and taking off regular upper body clothing?: A Little Help from another person to put on and taking off regular lower body clothing?: A Lot 6 Click Score: 16    End of Session Equipment Utilized During  Treatment: Gait belt;Rolling walker;Other (comment)(3 in 1)  OT Visit Diagnosis: Unsteadiness on feet (R26.81);Other abnormalities of gait and mobility (R26.89);Muscle weakness (generalized) (M62.81);History of falling (Z91.81);Pain Pain - Right/Left: Right Pain - part of body: Hip   Activity Tolerance Patient tolerated treatment well   Patient Left with call bell/phone within reach;in chair;with chair alarm set   Nurse Communication      Functional Assessment Tool Used: AM-PAC 6 Clicks Daily Activity   Time: 4098-1191 OT Time Calculation (min): 24 min  Charges: OT General Charges $OT Visit: 1 Visit OT Treatments $Self Care/Home Management : 8-22 mins $Therapeutic Activity: 8-22 mins     [name] 07/11/2018, 2:24 PM

## 2018-07-11 NOTE — Progress Notes (Signed)
Physical Therapy Treatment Patient Details Name: Kristine Roberts MRN: [id number] DOB: 04-19-1956 Today's Date: 07/11/2018    History of Present Illness Pt presents s/p R IM nail hip fx after falling down 3 steps while avoiding a cat. Hx of chronic wounds in lower extremities, COPD, malnutrition, osteopenia.     PT Comments    Continuing work on functional mobility and activity tolerance; Pt session focused on gait training and therapeutic exercise. Pt was able to get out of R side of bed towards her R hip, as she reports "this is the side I get out on at home," with min assist. Pt demonstrated improvement during gait, doubling her ambulation distance from the session prior (up to 40 feet now) with min assist. Pt's gait is improving from step-to to an emergent step through pattern with decreased step lengths. Overall locomotion and fluidity of gait is progressing well, however pt does take increased time to move. Pt is very motivated, but still concerned about pt's ability to safely return home at this point. Pt lives with boyfriend who is available to help at home, but he is recovering from his own hip surgery so this raises concerns about his ability to safely care for her without her continuing rehab first.  In order to address pt's lack of strength and stability, recommend continuing therapy at SNF in order to ensure her safety before returning home. Pt is improving but needs continued rehab for improvement of stability, strength, and confidence to help ensure her safety.     Follow Up Recommendations  SNF     Equipment Recommendations  Rolling walker with 5" wheels    Recommendations for Other Services       Precautions / Restrictions Precautions Precautions: None Restrictions Weight Bearing Restrictions: No    Mobility  Bed Mobility Overal bed mobility: Needs Assistance Bed Mobility: Supine to Sit;Sit to Supine Rolling: Min assist   Supine to sit: Min assist     General bed  mobility comments: Min A with elevatig trunk. Increased pain with LE movement  Transfers Overall transfer level: Needs assistance Equipment used: Rolling walker (2 wheeled) Transfers: Sit to/from Stand Sit to Stand: Min assist         General transfer comment: Pt required increased time due to pain, anxious  Ambulation/Gait Ambulation/Gait assistance: Min guard Gait Distance (Feet): 45 Feet Assistive device: Rolling walker (2 wheeled) Gait Pattern/deviations: Step-to pattern;Step-through pattern;Decreased step length - right;Decreased step length - left;Decreased dorsiflexion - right(Emerging step through gait towards end of ambulation session)     General Gait Details: Pt showed increased ability to ambulate smoothly. Pt was able to lift up R foot this session instead of dragging it forward. Pt demonstrated a step to pattern for the first 25 feet of ambulation with min assist/min guard. However, pt became more confident for the final 20 feet and showed an emerging step through gait pattern with less need for tactile cues on her shoulder to help her stand up tall. Pt felt more stable towards end of ambulation.   Stairs             Wheelchair Mobility    Modified Rankin (Stroke Patients Only)       Balance Overall balance assessment: Needs assistance Sitting-balance support: No upper extremity supported;Feet supported Sitting balance-Leahy Scale: Poor     Standing balance support: Bilateral upper extremity supported;During functional activity Standing balance-Leahy Scale: Fair  Cognition Arousal/Alertness: Awake/alert Behavior During Therapy: WFL for tasks assessed/performed Overall Cognitive Status: Within Functional Limits for tasks assessed                                        Exercises General Exercises - Lower Extremity Heel Slides: AAROM;Right;5 reps    General Comments General comments (skin  integrity, edema, etc.): Pt is improving but needs continued rehab for improvement of stability, strength, and confidence to help ensure her safety.       Pertinent Vitals/Pain Pain Assessment: 0-10 Pain Score: 7  Pain Location: R hip Pain Descriptors / Indicators: Grimacing;Sharp;Moaning Pain Intervention(s): Monitored during session    Home Living                      Prior Function            PT Goals (current goals can now be found in the care plan section) Acute Rehab PT Goals Patient Stated Goal: go home PT Goal Formulation: With patient Time For Goal Achievement: 07/24/18 Potential to Achieve Goals: Good Progress towards PT goals: Progressing toward goals(D/c plan to go to SNF for rehab before home)    Frequency    Min 5X/week      PT Plan Discharge plan needs to be updated    Co-evaluation              AM-PAC PT "6 Clicks" Daily Activity  Outcome Measure  Difficulty turning over in bed (including adjusting bedclothes, sheets and blankets)?: A Lot Difficulty moving from lying on back to sitting on the side of the bed? : A Little Difficulty sitting down on and standing up from a chair with arms (e.g., wheelchair, bedside commode, etc,.)?: A Lot Help needed moving to and from a bed to chair (including a wheelchair)?: A Little Help needed walking in hospital room?: A Lot Help needed climbing 3-5 steps with a railing? : Total 6 Click Score: 13    End of Session Equipment Utilized During Treatment: Gait belt Activity Tolerance: Patient limited by pain Patient left: in chair;with call bell/phone within reach;with chair alarm set   PT Visit Diagnosis: Unsteadiness on feet (R26.81);Other abnormalities of gait and mobility (R26.89);Pain Pain - Right/Left: Right Pain - part of body: Hip     Time: 5364-6803 PT Time Calculation (min) (ACUTE ONLY): 31 min  Charges:  $Gait Training: 8-22 mins $Therapeutic Exercise: 8-22 mins                      Clemon Chambers, [place] Office #[phone]    Clemon Chambers 07/11/2018, 11:01 AM

## 2018-07-11 NOTE — Progress Notes (Signed)
Patient continues to have N/V. Patient states that she has this problem at home and refuses further medication. Will continue to monitor.

## 2018-07-11 NOTE — Progress Notes (Signed)
Physical Therapy Treatment Patient Details Name: Kristine Roberts MRN: [id number] DOB: November 12, 1956 Today's Date: 07/11/2018    History of Present Illness Pt presents s/p R IM nail hip fx after falling down 3 steps while avoiding a cat. Hx of chronic wounds in lower extremities, COPD, malnutrition, osteopenia.     PT Comments    Continuing work on functional mobility and activity tolerance;  Session focused on Therapeutic Exercise aimed at muscle control and strengthening R hip; Initially very painful, but Kristine Roberts was still very willing to participate, and with better tolerance of motion R LE with more reps -- I'm hopeful that this exercise session also helped to decr Kristine Roberts anxiety with movement, and help decr her anticipation of pain; I remain concerned with her boyfriend's ability to provide adequate assist at home, and believe that more consistent PT training at SNF will make getting home better and safer for Kristine Roberts.  Follow Up Recommendations  SNF     Equipment Recommendations  Rolling walker with 5" wheels;3in1 (PT)    Recommendations for Other Services       Precautions / Restrictions Precautions Precautions: None Restrictions Weight Bearing Restrictions: No    Mobility  Bed Mobility               General bed mobility comments: pt up in recliner upon arrival  Transfers Overall transfer level: Needs assistance Equipment used: Rolling walker (2 wheeled) Transfers: Sit to/from Stand Sit to Stand: Min assist         General transfer comment: Pt required increased time due to pain, anxious  Ambulation/Gait                 Stairs             Wheelchair Mobility    Modified Rankin (Stroke Patients Only)       Balance Overall balance assessment: Needs assistance Sitting-balance support: No upper extremity supported;Feet supported Sitting balance-Leahy Scale: Poor     Standing balance support: Bilateral upper extremity  supported;During functional activity Standing balance-Leahy Scale: Poor                              Cognition Arousal/Alertness: Awake/alert Behavior During Therapy: WFL for tasks assessed/performed Overall Cognitive Status: Within Functional Limits for tasks assessed                                        Exercises General Exercises - Lower Extremity Quad Sets: AROM;Right;10 reps Gluteal Sets: AROM;Both;10 reps Short Arc Quad: AROM;Right;10 reps Heel Slides: AAROM;Right;10 reps Hip ABduction/ADduction: AROM;Right;10 reps(first 5 reps isometric) Other Exercises Other Exercises: Isometric hip adduction (towel squeeze) x10    General Comments        Pertinent Vitals/Pain Pain Assessment: Faces Pain Score: 6  Faces Pain Scale: Hurts even more Pain Location: R hip Pain Descriptors / Indicators: Grimacing;Sharp;Moaning Pain Intervention(s): RN gave pain meds during session    Home Living                      Prior Function            PT Goals (current goals can now be found in the care plan section) Acute Rehab PT Goals Patient Stated Goal: go home PT Goal Formulation: With patient Time For Goal Achievement: 07/24/18 Potential to Achieve  Goals: Good Progress towards PT goals: Progressing toward goals    Frequency    Min 5X/week      PT Plan Current plan remains appropriate    Co-evaluation              AM-PAC PT "6 Clicks" Daily Activity  Outcome Measure  Difficulty turning over in bed (including adjusting bedclothes, sheets and blankets)?: A Lot Difficulty moving from lying on back to sitting on the side of the bed? : A Little Difficulty sitting down on and standing up from a chair with arms (e.g., wheelchair, bedside commode, etc,.)?: A Lot Help needed moving to and from a bed to chair (including a wheelchair)?: A Little Help needed walking in hospital room?: A Little Help needed climbing 3-5 steps with a  railing? : A Lot 6 Click Score: 15    End of Session   Activity Tolerance: Patient tolerated treatment well Patient left: in bed;with call bell/phone within reach(with RN changing dressing) Nurse Communication: Mobility status PT Visit Diagnosis: Unsteadiness on feet (R26.81);Other abnormalities of gait and mobility (R26.89);Pain Pain - Right/Left: Right Pain - part of body: Hip     Time: 1600-1620 PT Time Calculation (min) (ACUTE ONLY): 20 min  Charges:  $Therapeutic Exercise: 8-22 mins                     Van Clines, PT  Acute Rehabilitation Services Pager [phone] Office [phone]    Kristine Roberts 07/11/2018, 5:04 PM

## 2018-07-11 NOTE — Progress Notes (Signed)
PROGRESS NOTE  Kristine Roberts  NID:[id number] DOB: 01-14-1956 DOA: 07/08/2018 PCP: [facility] [place] [facility]   Brief Narrative: Kristine Roberts is a 62 y.o. female with a history of COPD, malnutrition, osteopenia, and PUD who presented to the ED after a fall from home, having landed on the right hip with immediate pain and inability to bear weight, found to have a closed, right impaction fracture involving the lesser trochanter. Orthopedics was consulted and she had ORIF with IM implant 8/4 by Dr. Aundria Rud and has been encouraged to discharge to short term rehabilitation prior to returning home.  Assessment & Plan: Principal Problem:   Closed right hip fracture (HCC) Active Problems:   COPD (chronic obstructive pulmonary disease) (HCC)   Protein-calorie malnutrition, severe   Hypokalemia   Cellulitis of both lower extremities  Acute, closed, intertrochanteric fracture of right hip: s/p IM implant 8/4 by Dr. Aundria Rud.  - WBAT RLE - Pain control: Will add NSAID scheduled, continue prn tylenol, tramadol, oxycodone for mild, moderate or severe pain respectively. Will continue rescue IV dilaudid which the patient has continued requiring thus far.   - PT/OT recommending discharge to SNF which seems appropriate with her limited supports at home and coming after falling at home.  Acute blood loss anemia due to surgery: Suspect some reequilibration post op, as hgb has returned to 9.2 with no intervention.  - Recommend recheck CBC at follow up.   Malnutrition: BMI is 14.  - Dietitian assistance appreciated. Protein supplementation as tolerated  Chronic LE wounds with cellulitis: Cellulitis is improving.  - Continue empiric ancef for nonpurulent SSTI. PCN allergy noted, though pt is tolerating 1st gen ceph. - Wound care RN has been consulted, will follow local wound care recommendations. Follow up with wound care center as outpatient.   - Dietitian consulted as above  Hypokalemia: Resolved.    COPD: No exacerbation - BDs, no steroids  Depression:  - Continue SSRI, qHS trazodone  DVT prophylaxis: ASA BID, SCDs Code Status: Full Family Communication: Boyfriend at bedside Disposition Plan: SNF in next 24 hours.  Consultants:   Orthopedics, Dr. Aundria Rud  Procedures:   07/09/18 INTRAMEDULLARY (IM) NAIL INTERTROCHANTRIC Yolonda Kida, MD   Antimicrobials:  Ancef   Subjective: Required dilaudid for pain control overnight, but has been able to work with PT today. They feel she would be best served at [facility]. Continues to feel weak but no chest pain, dyspnea, dizziness.   Objective: Vitals:   07/11/18 0517 07/11/18 0831 07/11/18 0939 07/11/18 1646  BP: (!) 154/85  110/72 116/72  Pulse: 65 64 66 67  Resp: 18 18 18 18   Temp: 99.4 F (37.4 C)  100.1 F (37.8 C) 100 F (37.8 C)  TempSrc: Oral  Oral Oral  SpO2: 98% 94% 99% 96%  Weight:      Height:        Intake/Output Summary (Last 24 hours) at 07/11/2018 1719 Last data filed at 07/11/2018 1500 Gross per 24 hour  Intake 1360 ml  Output 200 ml  Net 1160 ml   Filed Weights   07/08/18 1626 07/08/18 2328 07/10/18 2054  Weight: 31.8 kg (70 lb) 35.1 kg (77 lb 6.1 oz) 35.1 kg (77 lb 4.8 oz)   Gen: Thin female in no distress Pulm: Nonlabored breathing room air. Clear. CV: Regular rate and rhythm. No murmur, rub, or gallop. No JVD, no dependent edema. GI: Abdomen soft, non-tender, non-distended, with normoactive bowel sounds.  Ext: Warm, no deformities. Dressing c/d/i, soft compartment. Skin: No  rashes, lesions or ulcers on visualized skin.  Neuro: Alert and oriented. No focal neurological deficits. Psych: Judgement and insight appear fair. Mood euthymic & affect congruent. Behavior is appropriate.    Data Reviewed: I have personally reviewed following labs and imaging studies  CBC: Recent Labs  Lab 07/08/18 1710 07/09/18 0404 07/11/18 0356  WBC 10.6* 5.6 7.8  NEUTROABS 9.0*  --   --   HGB 9.2* 7.6* 9.2*   HCT 30.6* 26.5* 31.0*  MCV 87.2 90.1 87.8  PLT 349 292 289   Basic Metabolic Panel: Recent Labs  Lab 07/08/18 1710 07/09/18 0633 07/11/18 0356  NA 136 139 137  K 3.0* 4.5 4.8  CL 104 110 104  CO2 24 22 25   GLUCOSE 89 73 86  BUN 13 9 <5*  CREATININE 0.72 0.55 0.53  CALCIUM 8.5* 8.0* 8.3*  MG 2.1  --   --    GFR: Estimated Creatinine Clearance: 40.4 mL/min (by C-G formula based on SCr of 0.53 mg/dL). Liver Function Tests: No results for input(s): AST, ALT, ALKPHOS, BILITOT, PROT, ALBUMIN in the last 168 hours. No results for input(s): LIPASE, AMYLASE in the last 168 hours. No results for input(s): AMMONIA in the last 168 hours. Coagulation Profile: Recent Labs  Lab 07/08/18 1710  INR 0.95   Cardiac Enzymes: No results for input(s): CKTOTAL, CKMB, CKMBINDEX, TROPONINI in the last 168 hours. BNP (last 3 results) No results for input(s): PROBNP in the last 8760 hours. HbA1C: No results for input(s): HGBA1C in the last 72 hours. CBG: No results for input(s): GLUCAP in the last 168 hours. Lipid Profile: No results for input(s): CHOL, HDL, LDLCALC, TRIG, CHOLHDL, LDLDIRECT in the last 72 hours. Thyroid Function Tests: No results for input(s): TSH, T4TOTAL, FREET4, T3FREE, THYROIDAB in the last 72 hours. Anemia Panel: No results for input(s): VITAMINB12, FOLATE, FERRITIN, TIBC, IRON, RETICCTPCT in the last 72 hours. Urine analysis:    Component Value Date/Time   COLORURINE YELLOW 12/26/2017 1006   APPEARANCEUR CLEAR 12/26/2017 1006   LABSPEC 1.026 12/26/2017 1006   PHURINE 5.0 12/26/2017 1006   GLUCOSEU NEGATIVE 12/26/2017 1006   HGBUR NEGATIVE 12/26/2017 1006   BILIRUBINUR NEGATIVE 12/26/2017 1006   KETONESUR 5 (A) 12/26/2017 1006   PROTEINUR NEGATIVE 12/26/2017 1006   NITRITE NEGATIVE 12/26/2017 1006   LEUKOCYTESUR NEGATIVE 12/26/2017 1006   Recent Results (from the past 240 hour(s))  MRSA PCR Screening     Status: None   Collection Time: 07/08/18  8:00 PM   Result Value Ref Range Status   MRSA by PCR NEGATIVE NEGATIVE Final    Comment:        The GeneXpert MRSA Assay (FDA approved for NASAL specimens only), is one component of a comprehensive MRSA colonization surveillance program. It is not intended to diagnose MRSA infection nor to guide or monitor treatment for MRSA infections. Performed at [facility], [address], [place], [place]       Radiology Studies: No results found.  Scheduled Meds: . ammonium lactate   Topical Daily  . aspirin EC  81 mg Oral BID  . citalopram  20 mg Oral Daily  . collagenase   Topical Daily  . feeding supplement (ENSURE ENLIVE)  237 mL Oral TID BM  . mometasone-formoterol  1 puff Inhalation BID  . multivitamin with minerals  1 tablet Oral Daily  . polyethylene glycol  17 g Oral Daily  . potassium chloride  40 mEq Oral BID  . senna-docusate  1  tablet Oral BID  . traZODone  100 mg Oral QHS   Continuous Infusions: .  ceFAZolin (ANCEF) IV 2 g (07/11/18 1425)     LOS: 3 days   Time spent: 25 minutes.  Tyrone Nine, MD [facility] [email or web address] Password [email or web address] 07/11/2018, 5:19 PM

## 2018-07-12 ENCOUNTER — Encounter (HOSPITAL_BASED_OUTPATIENT_CLINIC_OR_DEPARTMENT_OTHER): Payer: Medicaid Other | Attending: Physician Assistant

## 2018-07-12 MED ORDER — PROMETHAZINE HCL 6.25 MG/5ML PO SYRP
12.5000 mg | ORAL_SOLUTION | Freq: Four times a day (QID) | ORAL | Status: DC | PRN
  Administered 2018-07-12: 12.5 mg via ORAL
  Filled 2018-07-12 (×2): qty 10

## 2018-07-12 MED ORDER — PROMETHAZINE HCL 6.25 MG/5ML PO SYRP
25.0000 mg | ORAL_SOLUTION | Freq: Four times a day (QID) | ORAL | Status: DC | PRN
  Administered 2018-07-15 – 2018-07-16 (×3): 25 mg via ORAL
  Filled 2018-07-12 (×4): qty 20

## 2018-07-12 NOTE — Progress Notes (Signed)
Physical Therapy Treatment Patient Details Name: Kristine Roberts MRN: [id number] DOB: 1956/10/21 Today's Date: 07/12/2018    History of Present Illness Pt presents s/p R IM nail hip fx after falling down 3 steps while avoiding a cat. Hx of chronic wounds in lower extremities, COPD, malnutrition, osteopenia.     PT Comments    Continuing work on functional mobility and activity tolerance;  Educated pt on and demonstrated the standing exercises listed home exercise program to help her familiarize herself with the exercises before d/c home. The session mainly focused on educating pt on stair negotiation without railings as the pt will be returning to a home with 3 steps, no railings, to enter the 1 level home. Despite pain and nausea, pt was able to watch the demonstration of how to ascend/descend 3 stairs with a walker and then demonstrate it for [place] to watch. Good recall of instructions when asked about them. Pt demonstrated ability to perform 3 steps up and down with a walker, with one person min assist her and another stabilizing the walker. Pt was also educated on how her boyfriend, Trey Paula, should stabilize the walker for her when going up and down steps from now on as he will be her primary caregiver. PT will benefit from continued skilled [facility] PT when she returns home in order to address lack of strength, balance and functional mobility to improve her overall safety at home.     Follow Up Recommendations  Home health PT     Equipment Recommendations  Rolling walker with 5" wheels;3in1 (PT)    Recommendations for Other Services       Precautions / Restrictions Precautions Precautions: None Restrictions Weight Bearing Restrictions: No    Mobility  Bed Mobility Overal bed mobility: Modified Independent Bed Mobility: Supine to Sit;Sit to Supine Rolling: Min assist   Supine to sit: Min assist Sit to supine: Min assist   General bed mobility comments: Decreased speed of bed mobility. Pt  is very cautious to move R leg from supine to hanging EOB  Transfers Overall transfer level: Needs assistance Equipment used: Rolling walker (2 wheeled) Transfers: Sit to/from Stand Sit to Stand: Min assist         General transfer comment: Pt improved sit to stand transfer from mod assist to min assist. Requires VC for foot placement, to put weight forward and to stand up straight  Ambulation/Gait Ambulation/Gait assistance: Min guard Gait Distance (Feet): 20 Feet(Focused on stair training ) Assistive device: Rolling walker (2 wheeled) Gait Pattern/deviations: Step-through pattern;Decreased step length - right;Decreased step length - left;Decreased stride length;Decreased dorsiflexion - right Gait velocity: decreased   General Gait Details: Pt was in pain and fatigued, along with nauseous. Pt still very willing to give her very best effort. We focused on stair training, hence the 20 feet of ambulation so that we could save energy for stairs.   Stairs Stairs: Yes Stairs assistance: Mod assist Stair Management: No rails Number of Stairs: 3 General stair comments: Pt was educated on how to negotiate stairs without rails by ascending backwards with a 2W walker. Pt noticeably fatigued, but able to perform with assist +2 for pt min assist and to stabilize walker. Also told pt how her boyfriend should be involved with this for when she gets home (i.e. stabilize walker for her).   Wheelchair Mobility    Modified Rankin (Stroke Patients Only)       Balance Overall balance assessment: Modified Independent Sitting-balance support: No upper extremity supported;Feet supported  Sitting balance-Leahy Scale: Fair Sitting balance - Comments: Pt leaning back a bit, but stable.  Postural control: Posterior lean Standing balance support: Bilateral upper extremity supported;During functional activity Standing balance-Leahy Scale: Fair                              Cognition  Arousal/Alertness: Awake/alert Behavior During Therapy: WFL for tasks assessed/performed Overall Cognitive Status: Within Functional Limits for tasks assessed                                        Exercises General Exercises - Lower Extremity Hip ABduction/ADduction: Standing;AROM;10 reps;Right(used 2W walker for stability ) Hip Flexion/Marching: AROM;5 reps;Standing(with 2w walker for stability ) Other Exercises Other Exercises: Straight leg extensions in standing x 10 on R Other Exercises: Standing butt knee bends (butt kick) x 10 on R    General Comments        Pertinent Vitals/Pain Pain Assessment: Faces Pain Score: 3  Faces Pain Scale: Hurts even more Pain Location: R hip Pain Descriptors / Indicators: Grimacing;Moaning Pain Intervention(s): Limited activity within patient's tolerance    Home Living                      Prior Function            PT Goals (current goals can now be found in the care plan section) Acute Rehab PT Goals Patient Stated Goal: go home PT Goal Formulation: With patient Time For Goal Achievement: 07/24/18 Potential to Achieve Goals: Good Progress towards PT goals: Progressing toward goals    Frequency    Min 5X/week      PT Plan Current plan remains appropriate    Co-evaluation              AM-PAC PT "6 Clicks" Daily Activity  Outcome Measure  Difficulty turning over in bed (including adjusting bedclothes, sheets and blankets)?: A Little Difficulty moving from lying on back to sitting on the side of the bed? : A Little Difficulty sitting down on and standing up from a chair with arms (e.g., wheelchair, bedside commode, etc,.)?: A Lot Help needed moving to and from a bed to chair (including a wheelchair)?: A Lot Help needed walking in hospital room?: A Little Help needed climbing 3-5 steps with a railing? : A Lot 6 Click Score: 15    End of Session Equipment Utilized During Treatment: Gait  belt Activity Tolerance: Patient limited by pain(Gave her best effort despite pain) Patient left: in chair;with call bell/phone within reach Nurse Communication: Mobility status PT Visit Diagnosis: Unsteadiness on feet (R26.81);Other abnormalities of gait and mobility (R26.89);Pain Pain - Right/Left: Right Pain - part of body: Hip     Time: 1324-4010 PT Time Calculation (min) (ACUTE ONLY): 34 min  Charges:  $Gait Training: 8-22 mins $Therapeutic Activity: 8-22 mins                     Clemon Chambers, SPT Office #[phone]    Clemon Chambers 07/12/2018, 4:08 PM

## 2018-07-12 NOTE — Progress Notes (Signed)
Paged Dr. Jarvis Newcomer, pt still vomiting moderate amount.  Pherergan ineffective.

## 2018-07-12 NOTE — [place] FL2 (Signed)
[place] MEDICAID FL2 LEVEL OF CARE SCREENING TOOL     IDENTIFICATION  Patient Name: Kristine Roberts Birthdate: 03-Aug-1956 Sex: female Admission Date (Current Location): 07/08/2018  [place] and [place] Number:  Kristine Roberts [id number] P Facility and Address:  [facility], [address] [place], [place] [address]      Provider Number: [id number]  Attending Physician Name and Address:  Tyrone Nine, MD  Relative Name and Phone Number:  Smith,Jeffrey - Significant other, [phone]     Current Level of Care: Hospital Recommended Level of Care: Skilled Nursing Facility Prior Approval Number:    Date Approved/Denied:   PASRR Number: [id number](Eff. 07/12/18)  Discharge Plan: SNF    Current Diagnoses: Patient Active Problem List   Diagnosis Date Noted  . Closed right hip fracture (HCC) 07/08/2018  . Cellulitis of both lower extremities 07/08/2018  . Gastric ulcer 01/11/2018  . Hypokalemia 12/26/2017  . Cachexia (HCC) 12/26/2017  . Hypotension 12/26/2017  . Tobacco abuse 12/26/2017  . ETOH abuse 12/26/2017  . Anemia 12/26/2017  . Pressure injury of skin 12/26/2017  . Protein-calorie malnutrition, severe 03/05/2016  . Gastric outlet obstruction 03/03/2016  . COPD (chronic obstructive pulmonary disease) (HCC) 03/03/2016  . Dysphagia 02/03/2016    Orientation RESPIRATION BLADDER Height & Weight     Self, Time, Situation, Place  Normal Incontinent, External catheter(Catheter placed 8/6) Weight: 77 lb 6.1 oz (35.1 kg) Height:  5\' 2"  (157.5 cm)  BEHAVIORAL SYMPTOMS/MOOD NEUROLOGICAL BOWEL NUTRITION STATUS      Continent Diet(Regular)  AMBULATORY STATUS COMMUNICATION OF NEEDS Skin   Limited Assist(Min guard) Verbally Other (Comment)(Excoriated right/left arm, knee, leg; Ecchymosis arm and leg. See additional comments section)                       Personal Care Assistance Level of Assistance  Bathing, Feeding, Dressing Bathing Assistance:  Limited assistance(Min guard) Feeding assistance: Limited assistance(Assistance with set-up) Dressing Assistance: Limited assistance(Min guard)     Functional Limitations Info  Sight, Hearing, Speech Sight Info: Impaired Hearing Info: Adequate Speech Info: Adequate    SPECIAL CARE FACTORS FREQUENCY  PT (By licensed PT), OT (By licensed OT)     PT Frequency: Evaluated 8/5 and a minimum of 5X per week therapy recommended during acute inpatient stay OT Frequency: Evaluated 8/5 and a minimum of 2X per week therapy recommended during acute inpatient stay              Contractures Contractures Info: Not present    Additional Factors Info  Code Status, Allergies Code Status Info: Full Allergies Info: Penicillins           Current Medications (07/12/2018):  This is the current hospital active medication list Current Facility-Administered Medications  Medication Dose Route Frequency Provider Last Rate Last Dose  . acetaminophen (TYLENOL) tablet 650 mg  650 mg Oral Q6H PRN Yolonda Kida, MD   650 mg at 07/10/18 1743   Or  . acetaminophen (TYLENOL) suppository 650 mg  650 mg Rectal Q6H PRN Yolonda Kida, MD      . albuterol (PROVENTIL) (2.5 MG/3ML) 0.083% nebulizer solution 2.5 mg  2.5 mg Inhalation Q6H PRN Yolonda Kida, MD      . ammonium lactate (LAC-HYDRIN) 12 % lotion   Topical Daily Tyrone Nine, MD      . aspirin EC tablet 81 mg  81 mg Oral BID Yolonda Kida, MD   81 mg at 07/12/18 1032  .  bisacodyl (DULCOLAX) suppository 10 mg  10 mg Rectal Daily PRN Tyrone Nine, MD      . ceFAZolin (ANCEF) IVPB 2g/100 mL premix  2 g Intravenous Q8H Yolonda Kida, MD 200 mL/hr at 07/12/18 0515 2 g at 07/12/18 0515  . citalopram (CELEXA) tablet 20 mg  20 mg Oral Daily Yolonda Kida, MD   20 mg at 07/12/18 1032  . collagenase (SANTYL) ointment   Topical Daily Hazeline Junker B, MD      . feeding supplement (ENSURE ENLIVE) (ENSURE ENLIVE) liquid 237  mL  237 mL Oral TID BM Yolonda Kida, MD   237 mL at 07/11/18 1422  . HYDROmorphone (DILAUDID) injection 0.5 mg  0.5 mg Intravenous Q3H PRN Tyrone Nine, MD   0.5 mg at 07/12/18 0937  . ibuprofen (ADVIL,MOTRIN) tablet 400 mg  400 mg Oral TID WC Tyrone Nine, MD   400 mg at 07/11/18 1802  . metoCLOPramide (REGLAN) tablet 5-10 mg  5-10 mg Oral Q8H PRN Yolonda Kida, MD       Or  . metoCLOPramide [facility]) injection 5-10 mg  5-10 mg Intravenous Q8H PRN Yolonda Kida, MD   10 mg at 07/11/18 1037  . mometasone-formoterol (DULERA) 200-5 MCG/ACT inhaler 1 puff  1 puff Inhalation BID Yolonda Kida, MD   1 puff at 07/12/18 [phone]  . multivitamin with minerals tablet 1 tablet  1 tablet Oral Daily Tyrone Nine, MD   1 tablet at 07/11/18 1044  . naphazoline-glycerin (CLEAR EYES REDNESS) ophth solution 1-2 drop  1-2 drop Both Eyes QID PRN Yolonda Kida, MD      . neomycin-bacitracin-polymyxin (NEOSPORIN) ointment 1 application  1 application Topical PRN Yolonda Kida, MD      . ondansetron [facility]) tablet 4 mg  4 mg Oral Q6H PRN Yolonda Kida, MD       Or  . ondansetron [facility]) injection 4 mg  4 mg Intravenous Q6H PRN Yolonda Kida, MD   4 mg at 07/12/18 0747  . oxyCODONE (Oxy IR/ROXICODONE) immediate release tablet 10 mg  10 mg Oral Q4H PRN Tyrone Nine, MD   10 mg at 07/12/18 1046  . polyethylene glycol (MIRALAX / GLYCOLAX) packet 17 g  17 g Oral Daily Tyrone Nine, MD   17 g at 07/11/18 1043  . potassium chloride SA (K-DUR,KLOR-CON) CR tablet 40 mEq  40 mEq Oral BID Yolonda Kida, MD   40 mEq at 07/11/18 1044  . promethazine (PHENERGAN) 6.25 MG/5ML syrup 12.5 mg  12.5 mg Oral Q6H PRN Tyrone Nine, MD   12.5 mg at 07/12/18 1033  . senna-docusate (Senokot-S) tablet 1 tablet  1 tablet Oral BID Tyrone Nine, MD   1 tablet at 07/11/18 1044  . traMADol (ULTRAM) tablet 50 mg  50 mg Oral Q6H PRN Tyrone Nine, MD      . traZODone (DESYREL)  tablet 100 mg  100 mg Oral QHS Yolonda Kida, MD   100 mg at 07/11/18 2212     Discharge Medications: Please see discharge summary for a list of discharge medications.  Relevant Imaging Results:  Relevant Lab Results:   Additional Information *ss# [id number].  *07/10/18 - Wound Care Note:Chronic nonhealing wounds to bilateral lower legs   Enzymatic debrider to open wound right anterior lower leg Wound type:chronic nonhealing  Pressure Injury POA: NA Measurement:right leg nonintact:  1 cm x 1.5 cm x 0.2 cm  Bilateral scaly patches to lower legs Wound QFJ:UVQQU red Drainage (amount, consistency, odor) minimal serosanguinous  No odor Periwound: dry chronic skin changes Dressing procedure/placement/frequency:Cleanse legs with soap and water and pat dry.  Apply Amlactin to bilateral lower legs. On right leg, nonintact lesion:  Apply Santyl to wound bed.  Cover with NS moist 2x2 and cover with foam dressing  Change daily    Okey Dupre Lazaro Arms, LCSW

## 2018-07-12 NOTE — Clinical Social Work Note (Signed)
Clinical Social Work Assessment  Patient Details  Name: Kristine Roberts MRN: [id number] Date of Birth: 03/13/1956  Date of referral:  07/11/18               Reason for consult:  Facility Placement, Discharge Planning                Permission sought to share information with:  Family Supports Permission granted to share information::  No  Name::        Agency::     Relationship::     Contact Information:     Housing/Transportation Living arrangements for the past 2 months:  Single Family Home Source of Information:  Patient Patient Interpreter Needed:  None Criminal Activity/Legal Involvement Pertinent to Current Situation/Hospitalization:  No - Comment as needed Significant Relationships:  Significant Other(Jeff) Lives with:  Significant Other Do you feel safe going back to the place where you live?  Yes Need for family participation in patient care:  Yes (Comment)  Care giving concerns:  Patient lives with her significant other Trey Paula and per patient, he can assist her if needed. [name] informed CSW that Trey Paula had a hip replacement about 6 weeks ago, but walks with a cane and drives.    Social Worker assessment / plan: CSW talked with patient at the bedside regarding PT recommendation of SNF. [name] was sitting in a chair and was alert, oriented, pleasant and engaged easily with CSW in conversation. Patient was informed that Medicaid will not pay for rehab, only the nursing care and patient declined SNF and wants to d/c home with [facility] services. [name] displayed insight when she initiated that comment that she will "take it slow" when she goes home and continue to work with PT here at the hospital until she is discharge. When asked, patient reported that Trey Paula drives and can do the grocery shopping, they have a washer/dryer at home and they have a tub, but she can wash up at the sink. CSW and patient discussed the safety precautions that would need to be taken if she decides to  take tub baths. CSW also advised patient that PT would be contacted regarding patient possibly taking tub baths.  Employment status:  Disabled (Comment on whether or not currently receiving Disability) Insurance information:  [place] PT Recommendations:  Skilled Nursing Facility Information / Referral to community resources:  Other (Comment Required)(None requested as patient plan to discharge home)  Patient/Family's Response to care:  No concerns expressed by patient regarding care during hospitalization.  Patient/Family's Understanding of and Emotional Response to Diagnosis, Current Treatment, and Prognosis:  Patient displayed a clear understanding of her mobility limitations and insight into the fact that she will need to take it slow at home for safety reasons during her recuperation.  Emotional Assessment Appearance:  Appears older than stated age Attitude/Demeanor/Rapport:  Engaged Affect (typically observed):  Pleasant, Appropriate Orientation:  Oriented to Self, Oriented to Place, Oriented to  Time, Oriented to Situation Alcohol / Substance use:  Tobacco Use, Alcohol Use, Illicit Drugs(Patient reported that she smokes, drinks 12 cans of beer per week and does not use illicit drugs) Psych involvement (Current and /or in the community):  No (Comment)  Discharge Needs  Concerns to be addressed:  Discharge Planning Concerns Readmission within the last 30 days:  No Current discharge risk:  None Barriers to Discharge:  Continued Medical Work up   [employer] Lazaro Arms, LCSW 07/12/2018, 1:24 PM

## 2018-07-12 NOTE — Care Management Note (Signed)
Case Management Note  Patient Details  Name: Kristine Roberts MRN: [id number] Date of Birth: Jun 27, 1956  Subjective/Objective:   62 yr old female s/p fall with right hip fracture. Patient underwent a right hip IM Nailing.                   Action/Plan: Case Manager spoke with patient concerning discharge plan and DME needs. Referral for Home Health therapy was called to [facility]. Patient says her boyfriend will assist her at discharge.  Expected Discharge Date:  pending           Expected Discharge Plan:  Home w Home Health Services  In-House Referral:  NA  Discharge planning Services  CM Consult  Post Acute Care Choice:  Durable Medical Equipment, Home Health Choice offered to:  Patient  DME Arranged:  3-N-1, Walker rolling DME Agency:  [facility]  HH Arranged:  PT, OT [facility] Agency:  [facility]  Status of Service:  Completed, signed off  If discussed at Long Length of Stay Meetings, dates discussed:    Additional Comments:  Durenda Guthrie, RN 07/12/2018, 2:15 PM

## 2018-07-12 NOTE — Progress Notes (Signed)
Occupational Therapy Treatment Patient Details Name: Kristine Roberts MRN: [id number] DOB: 10-28-1956 Today's Date: 07/12/2018    History of present illness Pt presents s/p R IM nail hip fx after falling down 3 steps while avoiding a cat. Hx of chronic wounds in lower extremities, COPD, malnutrition, osteopenia.    OT comments  Pt continues to make progress with functional goals. Pt declining SNF and wants to return home. Pt educated on ADL A/E and use of 3 in 1 for tub shower transfers with ADL bag and handouts provided. Pt reports that she will just sponge bathe until she is able to step into tub shower. OT will continue to follow acutely  Follow Up Recommendations  Home health OT    Equipment Recommendations  3 in 1 bedside commode;Other (comment)(ADL A/E kit)    Recommendations for Other Services      Precautions / Restrictions Precautions Precautions: None Restrictions Weight Bearing Restrictions: No       Mobility Bed Mobility Overal bed mobility: Needs Assistance Bed Mobility: Supine to Sit;Sit to Supine Rolling: Min assist   Supine to sit: Min assist Sit to supine: Mod assist   General bed mobility comments: pt has decreased speed getting from supine to EOB due to fear of pain while moving R leg. Educated pt on how to use a belt to wrap around foot to help move it without catching on bed.   Transfers Overall transfer level: Needs assistance Equipment used: Rolling walker (2 wheeled) Transfers: Sit to/from Stand Sit to Stand: Min assist         General transfer comment: Pt required increased time due to pain, anxious to move. Feet stay too far forward and she has her weight shifted back, making for a difficult rise from sitting. Tried using tactile cues for foot placement, but pt switched foot position immediately upon attempt to stand. VC used to tell patient how to shift weight during sit to stnd transfer     Balance Overall balance assessment: Modified  Independent Sitting-balance support: No upper extremity supported;Feet supported Sitting balance-Leahy Scale: Fair Sitting balance - Comments: Pt leaning back a bit, but stable.  Postural control: Posterior lean Standing balance support: Bilateral upper extremity supported;During functional activity Standing balance-Leahy Scale: Fair                             ADL either performed or assessed with clinical judgement   ADL Overall ADL's : Needs assistance/impaired     Grooming: Wash/dry hands;Min Dispensing optician: Ambulation;RW;Cueing for safety;Minimal assistance   Toileting- Water quality scientist and Hygiene: Sit to/from stand;Cueing for safety;Min guard         General ADL Comments: pt educated on ADL A/E and use of 3 in 1 for tub shower transfers     Vision Baseline Vision/History: Wears glasses Wears Glasses: Reading only Patient Visual Report: No change from baseline     Perception     Praxis      Cognition Arousal/Alertness: Awake/alert Behavior During Therapy: WFL for tasks assessed/performed Overall Cognitive Status: Within Functional Limits for tasks assessed                                          Exercises Exercises: General Lower Extremity  General Exercises - Lower Extremity Ankle Circles/Pumps: AROM;10 reps;Both Quad Sets: AROM;Right;10 reps Gluteal Sets: AROM;Both;10 reps Short Arc Quad: AROM;Right;10 reps Long Arc Quad: Right;AROM;5 reps Heel Slides: AAROM;Right;10 reps Hip ABduction/ADduction: AROM;Right;10 reps   Shoulder Instructions       General Comments      Pertinent Vitals/ Pain       Pain Assessment: 0-10 Pain Score: 3  Faces Pain Scale: Hurts little more Pain Location: R hip Pain Descriptors / Indicators: Grimacing;Moaning Pain Intervention(s): Premedicated before session;Monitored during session;Repositioned  Home Living                                           Prior Functioning/Environment              Frequency  Min 2X/week        Progress Toward Goals  OT Goals(current goals can now be found in the care plan section)  Progress towards OT goals: Progressing toward goals  Acute Rehab OT Goals Patient Stated Goal: go home  Plan Discharge plan needs to be updated    Co-evaluation                 AM-PAC PT "6 Clicks" Daily Activity     Outcome Measure   Help from another person eating meals?: None Help from another person taking care of personal grooming?: A Little Help from another person toileting, which includes using toliet, bedpan, or urinal?: A Little Help from another person bathing (including washing, rinsing, drying)?: A Little Help from another person to put on and taking off regular upper body clothing?: A Little Help from another person to put on and taking off regular lower body clothing?: A Lot 6 Click Score: 18    End of Session Equipment Utilized During Treatment: Gait belt;Rolling walker;Other (comment)(3 in 1)  OT Visit Diagnosis: Unsteadiness on feet (R26.81);Other abnormalities of gait and mobility (R26.89);Muscle weakness (generalized) (M62.81);History of falling (Z91.81);Pain Pain - Right/Left: Right Pain - part of body: Hip   Activity Tolerance Patient tolerated treatment well   Patient Left with call bell/phone within reach;with bed alarm set;in bed   Nurse Communication      Functional Assessment Tool Used: AM-PAC 6 Clicks Daily Activity   Time: 1420-1434 OT Time Calculation (min): 14 min  Charges: OT General Charges $OT Visit: 1 Visit OT Treatments $Self Care/Home Management : 8-22 mins     Britt Bottom 07/12/2018, 2:41 PM

## 2018-07-12 NOTE — Progress Notes (Signed)
Physical Therapy Treatment Patient Details Name: Kristine Roberts MRN: [id number] DOB: 21-Apr-1956 Today's Date: 07/12/2018    History of Present Illness Pt presents s/p R IM nail hip fx after falling down 3 steps while avoiding a cat. Hx of chronic wounds in lower extremities, COPD, malnutrition, osteopenia.     PT Comments    Continuing work on functional mobility and activity tolerance; Pt session focused on therapeutic exercise, gait training and pt education. Pt was given a home exercise program with LE strengthening exercises to follow, so pt was walked through each in the packet in order to familiarize her with them so that she can follow on her own in the future. Pt then demonstrated good improvement during ambulation from yesterday, progressing to a step through gait pattern despite decreased step length and decreased gait velocity. Pt presented much more stable in sitting and standing balance than previous session. Pt has been struggling with N/V, but despite feeling this way, has been more than willing to give her best effort with physical therapy and shows a great potential to reach her goals with continued therapy. Still have concerns about her boyfriend being able to care for her properly at this point if she were to return directly home despite her positive progression demonstrated this session.    Follow Up Recommendations  PT has been updated by CSW that pt will be returning home upon d/c. Pt would benefit from [facility] PT, and OT consult       Rolling walker with 5" wheels;3in1 (PT)    Recommendations for Other Services       Precautions / Restrictions Precautions Precautions: None Restrictions Weight Bearing Restrictions: No    Mobility  Bed Mobility Overal bed mobility: Needs Assistance Bed Mobility: Supine to Sit;Sit to Supine Rolling: Min assist   Supine to sit: Min assist Sit to supine: Mod assist   General bed mobility comments: pt has decreased speed getting from  supine to EOB due to fear of pain while moving R leg. Educated pt on how to use a belt to wrap around foot to help move it without catching on bed.   Transfers Overall transfer level: Needs assistance Equipment used: Rolling walker (2 wheeled) Transfers: Sit to/from Stand Sit to Stand: Mod assist         General transfer comment: Pt required increased time due to pain, anxious to move. Feet stay too far forward and a posterior lean, which made for a difficult rise from sitting. Tried using tactile cues for foot placement, but pt switched foot position immediately upon attempt to stand. VC used to tell patient how to shift weight during sit to stand transfer that was completed with a light mod assist.   Ambulation/Gait Ambulation/Gait assistance: Min guard Gait Distance (Feet): 75 Feet Assistive device: Rolling walker (2 wheeled) Gait Pattern/deviations: Step-through pattern;Decreased step length - right;Decreased step length - left;Decreased stride length;Decreased dorsiflexion - right Gait velocity: decreased   General Gait Details: Pt improving well. Pt demonstrating a step through gait with increased step lengths than last session, however still decreased overall. Pt gait velocity is still decreased, but pt is much more stable and moving much smoother today.   Stairs             Wheelchair Mobility    Modified Rankin (Stroke Patients Only)       Balance Overall balance assessment: Modified Independent Sitting-balance support: No upper extremity supported;Feet supported Sitting balance-Leahy Scale: Fair Sitting balance - Comments: Pt leaning back  a bit, but stable.  Postural control: Posterior lean Standing balance support: Bilateral upper extremity supported;During functional activity Standing balance-Leahy Scale: Fair                              Cognition Arousal/Alertness: Awake/alert Behavior During Therapy: WFL for tasks  assessed/performed Overall Cognitive Status: Within Functional Limits for tasks assessed                                        Exercises General Exercises - Lower Extremity Ankle Circles/Pumps: AROM;10 reps;Both Quad Sets: AROM;Right;10 reps Gluteal Sets: AROM;Both;10 reps Short Arc Quad: AROM;Right;10 reps Long Arc Quad: Right;AROM;5 reps Heel Slides: AAROM;Right;10 reps Hip ABduction/ADduction: AROM;Right;10 reps    General Comments        Pertinent Vitals/Pain Pain Assessment: Faces Faces Pain Scale: Hurts little more Pain Location: R hip Pain Descriptors / Indicators: Grimacing;Sharp;Moaning Pain Intervention(s): Monitored during session;Premedicated before session    Home Living                      Prior Function            PT Goals (current goals can now be found in the care plan section) Acute Rehab PT Goals Patient Stated Goal: go home PT Goal Formulation: With patient Time For Goal Achievement: 07/24/18 Potential to Achieve Goals: Good Progress towards PT goals: Progressing toward goals    Frequency    Min 5X/week      PT Plan Current plan remains appropriate    Co-evaluation              AM-PAC PT "6 Clicks" Daily Activity  Outcome Measure  Difficulty turning over in bed (including adjusting bedclothes, sheets and blankets)?: A Little Difficulty moving from lying on back to sitting on the side of the bed? : A Little Difficulty sitting down on and standing up from a chair with arms (e.g., wheelchair, bedside commode, etc,.)?: A Lot Help needed moving to and from a bed to chair (including a wheelchair)?: A Lot Help needed walking in hospital room?: A Little Help needed climbing 3-5 steps with a railing? : A Lot 6 Click Score: 15    End of Session Equipment Utilized During Treatment: Gait belt Activity Tolerance: Patient tolerated treatment well Patient left: in chair;with call bell/phone within reach Nurse  Communication: Mobility status PT Visit Diagnosis: Unsteadiness on feet (R26.81);Other abnormalities of gait and mobility (R26.89);Pain Pain - Right/Left: Right Pain - part of body: Hip     Time: 0940-1030(minus approx 5 min while pt on commode) PT Time Calculation (min) (ACUTE ONLY): 50 min  Charges:  $Gait Training: 8-22 mins $Therapeutic Exercise: 8-22 mins $Therapeutic Activity: 8-22 mins                     Clemon Chambers, SPT Office #[phone]    Clemon Chambers 07/12/2018, 12:05 PM

## 2018-07-12 NOTE — Clinical Social Work Note (Signed)
CSW received SNF consult and assessment completed with patient. Ms. Kristine Roberts plans to discharge home and hopes to be able to receive some HH services. CSW signing off as no SW intervention services needed at this time. Please reconsult if any SW needs arise prior to patient's discharge.  Genelle Bal, MSW, LCSW Licensed Clinical Social Worker Clinical Social Work Department [employer] [phone]

## 2018-07-12 NOTE — Progress Notes (Signed)
Pt complains of nausea and vomiting.  Zofran IV ineffective.  Pt states she takes phenergan at home for chronic nausea and vomiting.  Spoke with Dr. Jarvis Newcomer on floor, states he will go see patient.

## 2018-07-12 NOTE — Progress Notes (Addendum)
PROGRESS NOTE  Kristine Roberts  NOT:[id number] DOB: 1956-11-07 DOA: 07/08/2018 PCP: [facility]   Brief Narrative: Kristine Roberts is a 62 y.o. female with a history of COPD, malnutrition, osteopenia, and PUD who presented to the ED after a fall from home, having landed on the right hip with immediate pain and inability to bear weight, found to have a closed, right impaction fracture involving the lesser trochanter. Orthopedics was consulted and she had ORIF with IM implant 8/4 by Dr. Aundria Rud and has been encouraged to discharge to short term rehabilitation prior to returning home.  Assessment & Plan: Principal Problem:   Closed right hip fracture (HCC) Active Problems:   COPD (chronic obstructive pulmonary disease) (HCC)   Protein-calorie malnutrition, severe   Hypokalemia   Cellulitis of both lower extremities  Acute, closed, intertrochanteric fracture of right hip: s/p IM implant 8/4 by Dr. Aundria Rud.  - WBAT RLE - Pain control: Added NSAID, continue tylenol. Pt not absorbing po medications including tramadol and oxycodone, so no need to escalate dosing.  - Plan to discharge home once vomiting is controlled.  Acute blood loss anemia due to surgery: Suspect some reequilibration post op, as hgb has returned to 9.2 with no intervention.  - Recommend recheck CBC at follow up.   Severe protein calorie malnutrition: BMI is 14.  - Dietitian assistance appreciated. Protein supplementation as tolerated  Chronic gastric outlet obstruction: Due to anastomotic stricture s/p billroth resection of ulcer. Last dilatated Feb 2019 and has gained weight since then.  - This is exacerbated by current pain and pain medications, so she's requiring IV antiemetics and analgesics regularly with ongoing vomiting. I don't believe there's any indication for further work up at this time but need to get right regimen together for her to make it at home. - Added phenergan low dose, pt with refractory  vomiting and states she takes this at home without sedation, so increasing to 25mg  dose. - Continue zofran, reglan.  Chronic LE wounds with cellulitis: Cellulitis is improving.  - Continue empiric ancef for nonpurulent SSTI. PCN allergy noted, though pt is tolerating 1st gen ceph. Would discharge on keflex to complete course once stable for DC. - Wound care RN has been consulted, will follow local wound care recommendations. Follow up with wound care center as outpatient.   - Dietitian consulted as above  Hypokalemia: Resolved.   COPD: No exacerbation - BDs, no steroids  Depression:  - Continue SSRI, qHS trazodone  DVT prophylaxis: ASA BID, SCDs Code Status: Full Family Communication: Boyfriend at bedside Disposition Plan: Home in next 24 hours  Consultants:   Orthopedics, Dr. Aundria Rud  Procedures:   07/09/18 INTRAMEDULLARY (IM) NAIL INTERTROCHANTRIC Yolonda Kida, MD   Antimicrobials:  Ancef   Subjective: Continues to require IV dilaudid and having multiple episodes of emesis of gastric contents.   Objective: Vitals:   07/11/18 2031 07/12/18 0504 07/12/18 0802 07/12/18 0842  BP: 118/78 (!) 138/91 121/77   Pulse: 64 81 69 80  Resp: 18 18 18 18   Temp: 98.9 F (37.2 C) 98.6 F (37 C) 99.6 F (37.6 C)   TempSrc: Oral  Oral   SpO2: 96% 91% 96% 98%  Weight:  35.1 kg (77 lb 6.1 oz)    Height:        Intake/Output Summary (Last 24 hours) at 07/12/2018 1526 Last data filed at 07/12/2018 1332 Gross per 24 hour  Intake 750 ml  Output 400 ml  Net 350 ml   Filed  Weights   07/08/18 2328 07/10/18 2054 07/12/18 0504  Weight: 35.1 kg (77 lb 6.1 oz) 35.1 kg (77 lb 4.8 oz) 35.1 kg (77 lb 6.1 oz)   Gen: 62 y.o. female in no distress Pulm: Nonlabored breathing room air. Clear. CV: Regular rate and rhythm. No murmur, rub, or gallop. No JVD, no dependent edema. GI: Abdomen soft, non-tender, non-distended, with normoactive bowel sounds.  Ext: Warm, no deformities Skin:  Bilateral lower legs with chronic skin changes as previously mentioned. Erythema, warmth and tenderness improving.  Neuro: Alert and oriented. No focal neurological deficits. Psych: Judgement and insight appear fair. Mood euthymic & affect congruent. Behavior is appropriate.    Data Reviewed: I have personally reviewed following labs and imaging studies  CBC: Recent Labs  Lab 07/08/18 1710 07/09/18 0404 07/11/18 0356  WBC 10.6* 5.6 7.8  NEUTROABS 9.0*  --   --   HGB 9.2* 7.6* 9.2*  HCT 30.6* 26.5* 31.0*  MCV 87.2 90.1 87.8  PLT 349 292 289   Basic Metabolic Panel: Recent Labs  Lab 07/08/18 1710 07/09/18 0633 07/11/18 0356  NA 136 139 137  K 3.0* 4.5 4.8  CL 104 110 104  CO2 24 22 25   GLUCOSE 89 73 86  BUN 13 9 <5*  CREATININE 0.72 0.55 0.53  CALCIUM 8.5* 8.0* 8.3*  MG 2.1  --   --    GFR: Estimated Creatinine Clearance: 40.4 mL/min (by C-G formula based on SCr of 0.53 mg/dL). Liver Function Tests: No results for input(s): AST, ALT, ALKPHOS, BILITOT, PROT, ALBUMIN in the last 168 hours. No results for input(s): LIPASE, AMYLASE in the last 168 hours. No results for input(s): AMMONIA in the last 168 hours. Coagulation Profile: Recent Labs  Lab 07/08/18 1710  INR 0.95   Cardiac Enzymes: No results for input(s): CKTOTAL, CKMB, CKMBINDEX, TROPONINI in the last 168 hours. BNP (last 3 results) No results for input(s): PROBNP in the last 8760 hours. HbA1C: No results for input(s): HGBA1C in the last 72 hours. CBG: No results for input(s): GLUCAP in the last 168 hours. Lipid Profile: No results for input(s): CHOL, HDL, LDLCALC, TRIG, CHOLHDL, LDLDIRECT in the last 72 hours. Thyroid Function Tests: No results for input(s): TSH, T4TOTAL, FREET4, T3FREE, THYROIDAB in the last 72 hours. Anemia Panel: No results for input(s): VITAMINB12, FOLATE, FERRITIN, TIBC, IRON, RETICCTPCT in the last 72 hours. Urine analysis:    Component Value Date/Time   COLORURINE YELLOW  12/26/2017 1006   APPEARANCEUR CLEAR 12/26/2017 1006   LABSPEC 1.026 12/26/2017 1006   PHURINE 5.0 12/26/2017 1006   GLUCOSEU NEGATIVE 12/26/2017 1006   HGBUR NEGATIVE 12/26/2017 1006   BILIRUBINUR NEGATIVE 12/26/2017 1006   KETONESUR 5 (A) 12/26/2017 1006   PROTEINUR NEGATIVE 12/26/2017 1006   NITRITE NEGATIVE 12/26/2017 1006   LEUKOCYTESUR NEGATIVE 12/26/2017 1006   Recent Results (from the past 240 hour(s))  MRSA PCR Screening     Status: None   Collection Time: 07/08/18  8:00 PM  Result Value Ref Range Status   MRSA by PCR NEGATIVE NEGATIVE Final    Comment:        The GeneXpert MRSA Assay (FDA approved for NASAL specimens only), is one component of a comprehensive MRSA colonization surveillance program. It is not intended to diagnose MRSA infection nor to guide or monitor treatment for MRSA infections. Performed at [facility], [address], [place], [place]       Radiology Studies: No results found.  Scheduled Meds: . ammonium  lactate   Topical Daily  . aspirin EC  81 mg Oral BID  . citalopram  20 mg Oral Daily  . collagenase   Topical Daily  . feeding supplement (ENSURE ENLIVE)  237 mL Oral TID BM  . ibuprofen  400 mg Oral TID WC  . mometasone-formoterol  1 puff Inhalation BID  . multivitamin with minerals  1 tablet Oral Daily  . polyethylene glycol  17 g Oral Daily  . potassium chloride  40 mEq Oral BID  . senna-docusate  1 tablet Oral BID  . traZODone  100 mg Oral QHS   Continuous Infusions: .  ceFAZolin (ANCEF) IV 2 g (07/12/18 1455)     LOS: 4 days   Time spent: 25 minutes.  Tyrone Nine, MD [facility] [email or web address] Password [email or web address] 07/12/2018, 3:26 PM

## 2018-07-13 ENCOUNTER — Inpatient Hospital Stay (HOSPITAL_COMMUNITY): Payer: Medicaid Other

## 2018-07-13 DIAGNOSIS — R112 Nausea with vomiting, unspecified: Secondary | ICD-10-CM

## 2018-07-13 LAB — BPAM RBC
BLOOD PRODUCT EXPIRATION DATE: 201908112359
Blood Product Expiration Date: 201908112359
ISSUE DATE / TIME: 201908041238
ISSUE DATE / TIME: 201908041238
UNIT TYPE AND RH: 600
Unit Type and Rh: 600

## 2018-07-13 LAB — BASIC METABOLIC PANEL
ANION GAP: 8 (ref 5–15)
BUN: 6 mg/dL — AB (ref 8–23)
CHLORIDE: 97 mmol/L — AB (ref 98–111)
CO2: 33 mmol/L — ABNORMAL HIGH (ref 22–32)
Calcium: 8.3 mg/dL — ABNORMAL LOW (ref 8.9–10.3)
Creatinine, Ser: 0.44 mg/dL (ref 0.44–1.00)
Glucose, Bld: 98 mg/dL (ref 70–99)
POTASSIUM: 3.2 mmol/L — AB (ref 3.5–5.1)
SODIUM: 138 mmol/L (ref 135–145)

## 2018-07-13 LAB — TYPE AND SCREEN
ABO/RH(D): A NEG
Antibody Screen: NEGATIVE
UNIT DIVISION: 0
UNIT DIVISION: 0

## 2018-07-13 MED ORDER — OXYCODONE HCL 5 MG PO TABS
5.0000 mg | ORAL_TABLET | ORAL | Status: DC | PRN
  Administered 2018-07-15 (×2): 5 mg via ORAL
  Filled 2018-07-13 (×3): qty 1

## 2018-07-13 MED ORDER — PANTOPRAZOLE SODIUM 40 MG PO TBEC
40.0000 mg | DELAYED_RELEASE_TABLET | Freq: Every day | ORAL | Status: DC
  Administered 2018-07-13 – 2018-07-16 (×3): 40 mg via ORAL
  Filled 2018-07-13 (×3): qty 1

## 2018-07-13 MED ORDER — IOHEXOL 300 MG/ML  SOLN
100.0000 mL | Freq: Once | INTRAMUSCULAR | Status: AC | PRN
  Administered 2018-07-13: 100 mL via INTRAVENOUS

## 2018-07-13 NOTE — H&P (View-Only) (Signed)
 Referring Provider: [facility]   Primary Care Physician:  [facility] Primary Gastroenterologist: Jajeeb Rehman, MD  Reason for Consultation:   Nausea / vomiting    ASSESSMENT AND PLAN:    61 yo female with remote Bilroth I for PUD. She has chronic postprandial N/V secondary to severe anastomotic stricture. Probably had a vagotomy and has gastroparesis as well  Stricture last dilated in Feb 2019. She has managed to gain a few pounds but still malnourished. Difficult to ascertain if the N/V is any worse than normal.  -Obtain CT scan abd/ / pelvis to rule out any other cause of N/V such as SBO -EGD tomorrow, probably with dilation of anastomotic stricture . The risks and benefits of EGD were discussed and the patient agrees to proceed. Patient would like to have this done inpatient instead of trying to get back to Dr. Rehman's office while trying to recover from hip surgery.  -Continue nutritional supplements.  -She takes Reglan and it helps nausea but not vomiting, not surprisingly since this is mechanical problems. Reglan not usually given in setting of mechanical obstruction but she takes it at home without problems and it helps so will continue.   Hx of LA class D esophagitis. Not surprising with hx of chronic vomiting, partial GOO and probable gastroparesis. She is on Protonix at home. Not on GERD treatment in house.  -start protonix -keep HOB elevated at night.    Attending physician's note   I have taken an interval history, reviewed the chart and examined the patient. I agree with the Advanced Practitioner's note, impression and recommendations.   61-year-old with COPD, GERD s/p BI gastrectomy for PUD complicated by anastomotic stricture status post multiple EGDs with dilatations by Dr. Rehman with chronic postprandial N/V with gastroparesis (with H/O vagotomy). She wanted to get repeat EGD with dilatation performed while she is recovering from hip  surgery. Plan: CT. EGD with possible dil in AM. Continue PPIs.  Raj Jacobi Nile, MD    HPI: Kristine Roberts is a 61 y.o. female with COPD and malnutrition. She has a remote hx of Bilroth I surgery for PUD.  She has chronic N/V secondary to anastomotic stricture. She has been followed by Dr. Rehman over the last couple of years and has undergone at least three EGDs with dilation of a severely stenotic anastomosis which was also characterized by anastomotic ulcers. Additional EGD findings have consistently included LA Class D esophagitis and retained food in gastric pouch.   Last EGD with dilation was in Feb 2019.   Five days ago patient had a mechanical fall and sustained a right closed hip fracture.  5 days ago. She underwent surgery her at [facility] on 07/09/18. She required a blood transfusion for losses related to surgery.   Kristine Roberts says her N/V are chronic. She vomits undigested food almost immediately after eating. Food always feels like it is packing up in her esophagus / stomach until she ends of vomiting. Reglan helps the nausea but doesn't prevent the vomiting. She has tried eating small frequent meals but it hasn't made much difference. She initially said there wasn't any improvement after last dilation in Feb but upon further reflection feels that she got a few months reprieve. She has gained some weight over last few months. No lower GI complaints.     Past Medical History:  Diagnosis Date  . COPD (chronic obstructive pulmonary disease) (HCC)   . Dysphagia   . Esophagitis   . Gastric outlet obstruction   .   Gastric stenosis   . GERD (gastroesophageal reflux disease)   . Hiatal hernia   . PUD (peptic ulcer disease)   . Wounds, multiple     Past Surgical History:  Procedure Laterality Date  . APPENDECTOMY    . BALLOON DILATION N/A 03/05/2016   Procedure: BALLOON DILATION;  Surgeon: Najeeb U Rehman, MD;  [facility];  Service: Endoscopy;  Laterality: N/A;  pyloric channel dialtaion    . CHOLECYSTECTOMY    . ESOPHAGEAL DILATION N/A 03/03/2016   Procedure: ESOPHAGEAL DILATION;  Surgeon: Najeeb U Rehman, MD;  [facility];  Service: Endoscopy;  Laterality: N/A;  . ESOPHAGEAL DILATION N/A 05/07/2016   Procedure: ESOPHAGEAL DILATION;  Surgeon: Najeeb U Rehman, MD;  [facility];  Service: Endoscopy;  Laterality: N/A;  . ESOPHAGEAL DILATION  01/26/2018   Procedure: DILATION OF ANASTOMOTIC  STRICTURE;  Surgeon: Rehman, Najeeb U, MD;  [facility];  Service: Endoscopy;;  . ESOPHAGOGASTRODUODENOSCOPY N/A 03/03/2016   Procedure: ESOPHAGOGASTRODUODENOSCOPY (EGD);  Surgeon: Najeeb U Rehman, MD;  [facility];  Service: Endoscopy;  Laterality: N/A;  2:00  . ESOPHAGOGASTRODUODENOSCOPY N/A 05/07/2016   Procedure: ESOPHAGOGASTRODUODENOSCOPY (EGD);  Surgeon: Najeeb U Rehman, MD;  [facility];  Service: Endoscopy;  Laterality: N/A;  855 - moved to 6/2 @ 10:15 - Ann notified pt  . ESOPHAGOGASTRODUODENOSCOPY N/A 12/28/2017   Procedure: ESOPHAGOGASTRODUODENOSCOPY (EGD) with stricture dilation;  Surgeon: Rehman, Najeeb U, MD;  [facility];  Service: Endoscopy;  Laterality: N/A;  . ESOPHAGOGASTRODUODENOSCOPY N/A 01/26/2018   Procedure: ESOPHAGOGASTRODUODENOSCOPY (EGD);  Surgeon: Rehman, Najeeb U, MD;  [facility];  Service: Endoscopy;  Laterality: N/A;  255  . ESOPHAGOGASTRODUODENOSCOPY (EGD) WITH PROPOFOL N/A 03/05/2016   Procedure: ESOPHAGOGASTRODUODENOSCOPY (EGD) WITH PROPOFOL Anastomotic stricture dilation ;  Surgeon: Najeeb U Rehman, MD;  [facility];  Service: Endoscopy;  Laterality: N/A;  to be done in OR under fluoro  . INTRAMEDULLARY (IM) NAIL INTERTROCHANTERIC Right 07/09/2018   Procedure: INTRAMEDULLARY (IM) NAIL INTERTROCHANTRIC;  Surgeon: Rogers, Jason Patrick, MD;  Location: [facility];  Service: Orthopedics;  Laterality: Right;  . STOMACH SURGERY      Prior to Admission medications   Medication Sig Start  Date End Date Taking? Authorizing Provider  albuterol (PROVENTIL HFA;VENTOLIN HFA) 108 (90 Base) MCG/ACT inhaler Inhale 2 puffs into the lungs every 6 (six) hours as needed for wheezing or shortness of breath.    Yes [provider]  celecoxib (CELEBREX) 100 MG capsule Take 100 mg by mouth daily.  06/13/18  Yes [provider]  citalopram (CELEXA) 20 MG tablet Take 20 mg by mouth daily. 06/23/18  Yes [provider]  DULERA 200-5 MCG/ACT AERO Inhale 1 puff into the lungs 2 (two) times daily.  12/22/17  Yes [provider]  feeding supplement, ENSURE ENLIVE, (ENSURE ENLIVE) LIQD Take 237 mLs by mouth 3 (three) times daily between meals. 12/30/17  Yes Emokpae, Courage, MD  Multiple Vitamin (MULTIVITAMIN WITH MINERALS) TABS tablet Take 1 tablet by mouth daily. 12/31/17  Yes Emokpae, Courage, MD  neomycin-bacitracin-polymyxin (NEOSPORIN) ointment Apply 1 application topically as needed for wound care.   Yes [provider]  SANTYL ointment Apply 1 application topically daily.  06/13/18  Yes [provider]  tetrahydrozoline 0.05 % ophthalmic solution Place 1 drop into both eyes daily as needed (dry eyes).   Yes [provider]  traZODone (DESYREL) 100 MG tablet Take 1 tablet (100 mg total)   by mouth at bedtime. 12/30/17  Yes Emokpae, Courage, MD  pantoprazole (PROTONIX) 40 MG tablet Take 1 tablet (40 mg total) by mouth daily before breakfast. Patient not taking: Reported on 07/08/2018 01/26/18   Rehman, Najeeb U, MD  sulfamethoxazole-trimethoprim (BACTRIM DS,SEPTRA DS) 800-160 MG tablet Take 1 tablet by mouth 2 (two) times daily. for 10 days starting on 06/21/2018 06/21/18   [provider]  traMADol (ULTRAM) 50 MG tablet Take 1 tablet (50 mg total) by mouth every 6 (six) hours as needed for moderate pain. 07/09/18   Rogers, Jason Patrick, MD    Current Facility-Administered Medications  Medication Dose Route Frequency Provider Last Rate Last Dose    . acetaminophen (TYLENOL) tablet 650 mg  650 mg Oral Q6H PRN Rogers, Jason Patrick, MD   650 mg at 07/10/18 1743   Or  . acetaminophen (TYLENOL) suppository 650 mg  650 mg Rectal Q6H PRN Rogers, Jason Patrick, MD      . albuterol (PROVENTIL) (2.5 MG/3ML) 0.083% nebulizer solution 2.5 mg  2.5 mg Inhalation Q6H PRN Rogers, Jason Patrick, MD      . ammonium lactate (LAC-HYDRIN) 12 % lotion   Topical Daily Grunz, Ryan B, MD      . aspirin EC tablet 81 mg  81 mg Oral BID Rogers, Jason Patrick, MD   81 mg at 07/13/18 1009  . bisacodyl (DULCOLAX) suppository 10 mg  10 mg Rectal Daily PRN Grunz, Ryan B, MD      . ceFAZolin (ANCEF) IVPB 2g/100 mL premix  2 g Intravenous Q8H Rogers, Jason Patrick, MD 200 mL/hr at 07/13/18 1331 2 g at 07/13/18 1331  . citalopram (CELEXA) tablet 20 mg  20 mg Oral Daily Rogers, Jason Patrick, MD   20 mg at 07/13/18 1009  . collagenase (SANTYL) ointment   Topical Daily Grunz, Ryan B, MD      . feeding supplement (ENSURE ENLIVE) (ENSURE ENLIVE) liquid 237 mL  237 mL Oral TID BM Rogers, Jason Patrick, MD   237 mL at 07/13/18 1009  . HYDROmorphone (DILAUDID) injection 0.5 mg  0.5 mg Intravenous Q3H PRN Grunz, Ryan B, MD   0.5 mg at 07/13/18 1053  . ibuprofen (ADVIL,MOTRIN) tablet 400 mg  400 mg Oral TID WC Grunz, Ryan B, MD   400 mg at 07/13/18 1219  . metoCLOPramide (REGLAN) tablet 5-10 mg  5-10 mg Oral Q8H PRN Rogers, Jason Patrick, MD   5 mg at 07/13/18 1329   Or  . metoCLOPramide (REGLAN) injection 5-10 mg  5-10 mg Intravenous Q8H PRN Rogers, Jason Patrick, MD   10 mg at 07/12/18 1927  . mometasone-formoterol (DULERA) 200-5 MCG/ACT inhaler 1 puff  1 puff Inhalation BID Rogers, Jason Patrick, MD   1 puff at 07/13/18 0730  . multivitamin with minerals tablet 1 tablet  1 tablet Oral Daily Grunz, Ryan B, MD   1 tablet at 07/13/18 1009  . naphazoline-glycerin (CLEAR EYES REDNESS) ophth solution 1-2 drop  1-2 drop Both Eyes QID PRN Rogers, Jason Patrick, MD      .  neomycin-bacitracin-polymyxin (NEOSPORIN) ointment 1 application  1 application Topical PRN Rogers, Jason Patrick, MD      . ondansetron (ZOFRAN) tablet 4 mg  4 mg Oral Q6H PRN Rogers, Jason Patrick, MD       Or  . ondansetron (ZOFRAN) injection 4 mg  4 mg Intravenous Q6H PRN Rogers, Jason Patrick, MD   4 mg at 07/13/18 1008  . oxyCODONE (Oxy IR/ROXICODONE) immediate release tablet 5   mg  5 mg Oral Q4H PRN Grunz, Ryan B, MD      . polyethylene glycol (MIRALAX / GLYCOLAX) packet 17 g  17 g Oral Daily Grunz, Ryan B, MD   17 g at 07/11/18 1043  . potassium chloride SA (K-DUR,KLOR-CON) CR tablet 40 mEq  40 mEq Oral BID Rogers, Jason Patrick, MD   40 mEq at 07/13/18 1009  . promethazine (PHENERGAN) 6.25 MG/5ML syrup 25 mg  25 mg Oral Q6H PRN Grunz, Ryan B, MD      . senna-docusate (Senokot-S) tablet 1 tablet  1 tablet Oral BID Grunz, Ryan B, MD   1 tablet at 07/13/18 1009  . traMADol (ULTRAM) tablet 50 mg  50 mg Oral Q6H PRN Grunz, Ryan B, MD      . traZODone (DESYREL) tablet 100 mg  100 mg Oral QHS Rogers, Jason Patrick, MD   100 mg at 07/12/18 2129    Allergies as of 07/08/2018 - Review Complete 07/08/2018  Allergen Reaction Noted  . Penicillins Itching, Swelling, and Other (See Comments) 02/03/2016    Family History  Problem Relation Age of Onset  . Alzheimer's disease Mother   . COPD Mother   . Throat cancer Father   . Breast cancer Sister     Social History   Socioeconomic History  . Marital status: Widowed    Spouse name: Not on file  . Number of children: Not on file  . Years of education: Not on file  . Highest education level: Not on file  Occupational History  . Not on file  Social Needs  . Financial resource strain: Not on file  . Food insecurity:    Worry: Not on file    Inability: Not on file  . Transportation needs:    Medical: Not on file    Non-medical: Not on file  Tobacco Use  . Smoking status: Current Every Day Smoker    Packs/day: 1.50    Years: 20.00     Pack years: 30.00    Types: Cigarettes  . Smokeless tobacco: Never Used  Substance and Sexual Activity  . Alcohol use: Yes    Alcohol/week: 12.0 standard drinks    Types: 12 Cans of beer per week    Comment: (beer)  3-4/WEEK    LAST BEER ON 8/2 (2)  . Drug use: No  . Sexual activity: Yes    Birth control/protection: None  Lifestyle  . Physical activity:    Days per week: Not on file    Minutes per session: Not on file  . Stress: Not on file  Relationships  . Social connections:    Talks on phone: Not on file    Gets together: Not on file    Attends religious service: Not on file    Active member of club or organization: Not on file    Attends meetings of clubs or organizations: Not on file    Relationship status: Not on file  . Intimate partner violence:    Fear of current or ex partner: Not on file    Emotionally abused: Not on file    Physically abused: Not on file    Forced sexual activity: Not on file  Other Topics Concern  . Not on file  Social History Narrative  . Not on file    Review of Systems: All systems reviewed and negative except where noted in HPI.  Physical Exam: Vital signs in last 24 hours: Temp:  [99.1 F (37.3 C)-99.9 F (  37.7 C)] 99.9 F (37.7 C) (08/08 0839) Pulse Rate:  [64-67] 65 (08/08 0839) Resp:  [18] 18 (08/08 0839) BP: (102-135)/(67-85) 103/67 (08/08 0839) SpO2:  [94 %-99 %] 99 % (08/08 0839) Last BM Date: 07/10/18 General:   Alert, emaciated female in NAD Psych:  Pleasant, cooperative. Normal mood and affect. Eyes:  Pupils equal, sclera clear, no icterus.   Conjunctiva pink. Ears:  Normal auditory acuity. Nose:  No deformity, discharge,  or lesions. Neck:  Supple; no masses Lungs:  Clear throughout to auscultation.   No wheezes, crackles, or rhonchi.  Heart:  Regular rate and rhythm; no murmurs, no edema Abdomen:  Soft, non-distended, nontender, BS active, no palp mass    Rectal:  Deferred  Msk:  Symmetrical without gross  deformities. . Neurologic:  Alert and  oriented x4;  grossly normal neurologically. Skin:  Dressings covering both distal lower extremities.   Intake/Output from previous day: 08/07 0701 - 08/08 0700 In: 931.1 [P.O.:500; IV Piggyback:431.1] Out: 0  Intake/Output this shift: Total I/O In: 250 [P.O.:250] Out: -   Lab Results: Recent Labs    07/11/18 0356  WBC 7.8  HGB 9.2*  HCT 31.0*  PLT 289   BMET Recent Labs    07/11/18 0356 07/13/18 0505  NA 137 138  K 4.8 3.2*  CL 104 97*  CO2 25 33*  GLUCOSE 86 98  BUN <5* 6*  CREATININE 0.53 0.44  CALCIUM 8.3* 8.3*    Studies/Results: No results found.   Paula Guenther, NP-C @  07/13/2018, 3:03 PM     

## 2018-07-13 NOTE — Progress Notes (Signed)
Physical Therapy Treatment Patient Details Name: Kristine Roberts MRN: [id number] DOB: 24-Jan-1956 Today's Date: 07/13/2018    History of Present Illness Pt presents s/p R IM nail hip fx after falling down 3 steps while avoiding a cat. Hx of chronic wounds in lower extremities, COPD, malnutrition, osteopenia.     PT Comments    Session focused on gait training. Patient with good progression towards physical therapy goals as evidenced by increased ambulation distance to 160 feet using walker and supervision. Continues to display decreased gait speed and gait abnormalities secondary to pain. D/c plan remains appropriate.    Follow Up Recommendations  Home health PT     Equipment Recommendations  Rolling walker with 5" wheels;3in1 (PT)    Recommendations for Other Services       Precautions / Restrictions Precautions Precautions: None Restrictions Weight Bearing Restrictions: No    Mobility  Bed Mobility Overal bed mobility: Modified Independent Bed Mobility: Supine to Sit;Sit to Supine           General bed mobility comments: Increased time and effort to progress edge of bed but no physical assistance required  Transfers Overall transfer level: Needs assistance Equipment used: Rolling walker (2 wheeled) Transfers: Sit to/from Stand Sit to Stand: Supervision         General transfer comment: supervision for safety  Ambulation/Gait Ambulation/Gait assistance: Supervision Gait Distance (Feet): 160 Feet Assistive device: Rolling walker (2 wheeled) Gait Pattern/deviations: Step-through pattern;Decreased step length - left;Decreased stride length;Decreased dorsiflexion - right;Step-to pattern;Narrow base of support Gait velocity: decreased   General Gait Details: Cues for increased left strength, wider BOS, step through pattern, scapular depression. patient with moderate reliance through BUE's on walker.   Stairs             Wheelchair Mobility    Modified  Rankin (Stroke Patients Only)       Balance Overall balance assessment: Modified Independent Sitting-balance support: No upper extremity supported;Feet supported Sitting balance-Leahy Scale: Good Sitting balance - Comments: Pt leaning back a bit, but stable.  Postural control: Posterior lean Standing balance support: During functional activity;Single extremity supported Standing balance-Leahy Scale: Fair                              Cognition Arousal/Alertness: Awake/alert Behavior During Therapy: WFL for tasks assessed/performed Overall Cognitive Status: Within Functional Limits for tasks assessed                                        Exercises      General Comments        Pertinent Vitals/Pain Pain Assessment: Faces Faces Pain Scale: Hurts even more Pain Location: R hip Pain Descriptors / Indicators: Grimacing;Operative site guarding Pain Intervention(s): Monitored during session    Home Living                      Prior Function            PT Goals (current goals can now be found in the care plan section) Acute Rehab PT Goals Patient Stated Goal: go home PT Goal Formulation: With patient Time For Goal Achievement: 07/24/18 Potential to Achieve Goals: Good Progress towards PT goals: Progressing toward goals    Frequency    Min 5X/week      PT Plan Current plan remains appropriate  Co-evaluation              AM-PAC PT "6 Clicks" Daily Activity  Outcome Measure  Difficulty turning over in bed (including adjusting bedclothes, sheets and blankets)?: A Little Difficulty moving from lying on back to sitting on the side of the bed? : A Little Difficulty sitting down on and standing up from a chair with arms (e.g., wheelchair, bedside commode, etc,.)?: A Lot Help needed moving to and from a bed to chair (including a wheelchair)?: A Lot Help needed walking in hospital room?: A Little Help needed climbing 3-5  steps with a railing? : A Lot 6 Click Score: 15    End of Session   Activity Tolerance: Patient tolerated treatment well Patient left: with call bell/phone within reach;in bed Nurse Communication: Mobility status PT Visit Diagnosis: Unsteadiness on feet (R26.81);Other abnormalities of gait and mobility (R26.89);Pain Pain - Right/Left: Right Pain - part of body: Hip     Time: 3382-5053 PT Time Calculation (min) (ACUTE ONLY): 19 min  Charges:  $Gait Training: 8-22 mins                     Kristine Roberts, PT, DPT Acute Rehabilitation Services  Pager: [phone]    Kristine Roberts 07/13/2018, 5:29 PM

## 2018-07-13 NOTE — Progress Notes (Addendum)
PROGRESS NOTE  Kristine Roberts  UMP:[id number] DOB: 11-Dec-1955 DOA: 07/08/2018 PCP: [facility]   Brief Narrative: Kristine Roberts is a 62 y.o. female with a history of COPD, malnutrition, osteopenia, and PUD s/p billroth complicated by chronic GOO/anastomotic stricture who presented to the ED after a fall from home, having landed on the right hip with immediate pain and inability to bear weight, found to have a closed, right impaction fracture involving the lesser trochanter. Orthopedics was consulted and she had ORIF with IM implant 8/4 by Dr. Aundria Rud. Post operative course complicated by acute on chronic nausea and vomiting which has left her reliant on IV pain medications. This has been going on for several days, so GI is consulted for further recommendations.  Assessment & Plan: Principal Problem:   Closed right hip fracture (HCC) Active Problems:   COPD (chronic obstructive pulmonary disease) (HCC)   Protein-calorie malnutrition, severe   Hypokalemia   Cellulitis of both lower extremities  Acute, closed, intertrochanteric fracture of right hip: s/p IM implant 8/4 by Dr. Aundria Rud.  - WBAT RLE - Pain control: Added NSAID, continue tylenol. Oxycodone prn. If unable to tolerate po, will have dilaudid IV available, but cannot discharge home until she's not requiring this.  - Plan to discharge home once vomiting is controlled.  Acute blood loss anemia due to surgery: Suspect some reequilibration post op, as hgb has returned to 9.2 with no intervention.  - Recommend recheck CBC at follow up.   Severe protein calorie malnutrition: BMI is 14.  - Dietitian assistance appreciated. Protein supplementation as tolerated  Chronic gastric outlet obstruction: Due to anastomotic stricture s/p billroth resection of ulcer. Last dilatated Feb 2019 and has gained weight since then.  - This is exacerbated by current pain and pain medications, so she's requiring IV antiemetics and analgesics  regularly with ongoing vomiting.  - Due to refractory symptoms, I've asked for GI evaluation. - Added phenergan low dose, ineffective, increased to 25mg  po dosing.  - Continue zofran, reglan.  Chronic LE wounds with cellulitis: Cellulitis is improving.  - Continue empiric ancef for nonpurulent SSTI. PCN allergy noted, though pt is tolerating 1st gen ceph. Would discharge on keflex to complete course once stable for DC. Will probably complete course in a couple days. - Wound care RN has been consulted, will follow local wound care recommendations. Follow up with wound care center as outpatient.    Hypokalemia: Due to GI losses, poor po. Has not been getting supplement.  - Replace and recheck in AM with Mg  COPD: No exacerbation - BDs, no steroids  Depression:  - Continue SSRI, qHS trazodone  DVT prophylaxis: ASA BID, SCDs Code Status: Full Family Communication: Boyfriend at bedside Disposition Plan: Home once not reliant on IV medications. Possibly 24 hours pending GI evaluation/procedure plans.  Consultants:   Orthopedics, Dr. GI  Procedures:   07/09/18 INTRAMEDULLARY (IM) NAIL INTERTROCHANTRIC 09/08/18, MD   Antimicrobials:  Ancef 8/3 >>   Subjective: Continues to require IV dilaudid, multiple daily episodes of emesis which her boyfriend reports can be typical for her but she's still in pain severe enough to require IV dilaudid.  Objective: Vitals:   07/12/18 2113 07/13/18 0504 07/13/18 0730 07/13/18 0839  BP: 102/69 135/85  103/67  Pulse: 66 67  65  Resp: 18 18  18   Temp: 99.1 F (37.3 C) 99.1 F (37.3 C)  99.9 F (37.7 C)  TempSrc: Oral Oral  Oral  SpO2: 94%  96% 97% 99%  Weight:      Height:        Intake/Output Summary (Last 24 hours) at 07/13/2018 1453 Last data filed at 07/13/2018 0900 Gross per 24 hour  Intake 801.05 ml  Output 0 ml  Net 801.05 ml   Filed Weights   07/08/18 2328 07/10/18 2054 07/12/18 0504  Weight: 35.1 kg  35.1 kg 35.1 kg   Gen: Thin, frail female in no distress Pulm: Nonlabored breathing room air. Clear. CV: Regular rate and rhythm. No murmur, rub, or gallop. No JVD, no dependent edema. GI: Abdomen soft, non-tender, non-distended, with normoactive bowel sounds.  Ext: Warm, no deformities Skin: LE redness, tenderness significantly improved. No purulence.  Neuro: Alert and oriented. No focal neurological deficits. Psych: Judgement and insight appear fair. Mood euthymic & affect congruent. Behavior is appropriate.    Data Reviewed: I have personally reviewed following labs and imaging studies  CBC: Recent Labs  Lab 07/08/18 1710 07/09/18 0404 07/11/18 0356  WBC 10.6* 5.6 7.8  NEUTROABS 9.0*  --   --   HGB 9.2* 7.6* 9.2*  HCT 30.6* 26.5* 31.0*  MCV 87.2 90.1 87.8  PLT 349 292 289   Basic Metabolic Panel: Recent Labs  Lab 07/08/18 1710 07/09/18 0633 07/11/18 0356 07/13/18 0505  NA 136 139 137 138  K 3.0* 4.5 4.8 3.2*  CL 104 110 104 97*  CO2 24 22 25  33*  GLUCOSE 89 73 86 98  BUN 13 9 <5* 6*  CREATININE 0.72 0.55 0.53 0.44  CALCIUM 8.5* 8.0* 8.3* 8.3*  MG 2.1  --   --   --    GFR: Estimated Creatinine Clearance: 40.4 mL/min (by C-G formula based on SCr of 0.44 mg/dL). Liver Function Tests: No results for input(s): AST, ALT, ALKPHOS, BILITOT, PROT, ALBUMIN in the last 168 hours. No results for input(s): LIPASE, AMYLASE in the last 168 hours. No results for input(s): AMMONIA in the last 168 hours. Coagulation Profile: Recent Labs  Lab 07/08/18 1710  INR 0.95   Cardiac Enzymes: No results for input(s): CKTOTAL, CKMB, CKMBINDEX, TROPONINI in the last 168 hours. BNP (last 3 results) No results for input(s): PROBNP in the last 8760 hours. HbA1C: No results for input(s): HGBA1C in the last 72 hours. CBG: No results for input(s): GLUCAP in the last 168 hours. Lipid Profile: No results for input(s): CHOL, HDL, LDLCALC, TRIG, CHOLHDL, LDLDIRECT in the last 72  hours. Thyroid Function Tests: No results for input(s): TSH, T4TOTAL, FREET4, T3FREE, THYROIDAB in the last 72 hours. Anemia Panel: No results for input(s): VITAMINB12, FOLATE, FERRITIN, TIBC, IRON, RETICCTPCT in the last 72 hours. Urine analysis:    Component Value Date/Time   COLORURINE YELLOW 12/26/2017 1006   APPEARANCEUR CLEAR 12/26/2017 1006   LABSPEC 1.026 12/26/2017 1006   PHURINE 5.0 12/26/2017 1006   GLUCOSEU NEGATIVE 12/26/2017 1006   HGBUR NEGATIVE 12/26/2017 1006   BILIRUBINUR NEGATIVE 12/26/2017 1006   KETONESUR 5 (A) 12/26/2017 1006   PROTEINUR NEGATIVE 12/26/2017 1006   NITRITE NEGATIVE 12/26/2017 1006   LEUKOCYTESUR NEGATIVE 12/26/2017 1006   Recent Results (from the past 240 hour(s))  MRSA PCR Screening     Status: None   Collection Time: 07/08/18  8:00 PM  Result Value Ref Range Status   MRSA by PCR NEGATIVE NEGATIVE Final    Comment:        The GeneXpert MRSA Assay (FDA approved for NASAL specimens only), is one component of a comprehensive MRSA colonization surveillance program.  It is not intended to diagnose MRSA infection nor to guide or monitor treatment for MRSA infections. Performed at [facility], [address], [place], [place] [address]       Radiology Studies: No results found.  Scheduled Meds: . ammonium lactate   Topical Daily  . aspirin EC  81 mg Oral BID  . citalopram  20 mg Oral Daily  . collagenase   Topical Daily  . feeding supplement (ENSURE ENLIVE)  237 mL Oral TID BM  . ibuprofen  400 mg Oral TID WC  . mometasone-formoterol  1 puff Inhalation BID  . multivitamin with minerals  1 tablet Oral Daily  . polyethylene glycol  17 g Oral Daily  . potassium chloride  40 mEq Oral BID  . senna-docusate  1 tablet Oral BID  . traZODone  100 mg Oral QHS   Continuous Infusions: .  ceFAZolin (ANCEF) IV 2 g (07/13/18 1331)     LOS: 5 days   Time spent: 25 minutes.  Tyrone Nine, MD [facility] [email or web address] Password  [facility] 07/13/2018, 2:53 PM

## 2018-07-13 NOTE — Consult Note (Addendum)
Referring Provider: [facility]   Primary Care Physician:  [facility] Primary Gastroenterologist: Macky Lower, MD  Reason for Consultation:   Nausea / vomiting    ASSESSMENT AND PLAN:    62 yo female with remote Bilroth I for PUD. She has chronic postprandial N/V secondary to severe anastomotic stricture. Probably had a vagotomy and has gastroparesis as well  Stricture last dilated in Feb 2019. She has managed to gain a few pounds but still malnourished. Difficult to ascertain if the N/V is any worse than normal.  -Obtain CT scan abd/ / pelvis to rule out any other cause of N/V such as SBO -EGD tomorrow, probably with dilation of anastomotic stricture . The risks and benefits of EGD were discussed and the patient agrees to proceed. Patient would like to have this done inpatient instead of trying to get back to Dr. Patty Sermons office while trying to recover from hip surgery.  -Continue nutritional supplements.  -She takes Reglan and it helps nausea but not vomiting, not surprisingly since this is mechanical problems. Reglan not usually given in setting of mechanical obstruction but she takes it at home without problems and it helps so will continue.   Hx of LA class D esophagitis. Not surprising with hx of chronic vomiting, partial GOO and probable gastroparesis. She is on Protonix at home. Not on GERD treatment in house.  -start protonix -keep HOB elevated at night.    Attending physician's note   I have taken an interval history, reviewed the chart and examined the patient. I agree with the Advanced Practitioner's note, impression and recommendations.   62 year old with COPD, GERD s/p BI gastrectomy for PUD complicated by anastomotic stricture status post multiple EGDs with dilatations by Dr. Karilyn Cota with chronic postprandial N/V with gastroparesis (with H/O vagotomy). She wanted to get repeat EGD with dilatation performed while she is recovering from hip  surgery. Plan: CT. EGD with possible dil in AM. Continue PPIs.  Edman Circle, MD    HPI: Kristine Roberts is a 62 y.o. female with COPD and malnutrition. She has a remote hx of Bilroth I surgery for PUD.  She has chronic N/V secondary to anastomotic stricture. She has been followed by Dr. Karilyn Cota over the last couple of years and has undergone at least three EGDs with dilation of a severely stenotic anastomosis which was also characterized by anastomotic ulcers. Additional EGD findings have consistently included LA Class D esophagitis and retained food in gastric pouch.   Last EGD with dilation was in Feb 2019.   Five days ago patient had a mechanical fall and sustained a right closed hip fracture.  5 days ago. She underwent surgery her at [facility] on 07/09/18. She required a blood transfusion for losses related to surgery.   [name] says her N/V are chronic. She vomits undigested food almost immediately after eating. Food always feels like it is packing up in her esophagus / stomach until she ends of vomiting. Reglan helps the nausea but doesn't prevent the vomiting. She has tried eating small frequent meals but it hasn't made much difference. She initially said there wasn't any improvement after last dilation in Feb but upon further reflection feels that she got a few months reprieve. She has gained some weight over last few months. No lower GI complaints.     Past Medical History:  Diagnosis Date  . COPD (chronic obstructive pulmonary disease) (HCC)   . Dysphagia   . Esophagitis   . Gastric outlet obstruction   .  Gastric stenosis   . GERD (gastroesophageal reflux disease)   . Hiatal hernia   . PUD (peptic ulcer disease)   . Wounds, multiple     Past Surgical History:  Procedure Laterality Date  . APPENDECTOMY    . BALLOON DILATION N/A 03/05/2016   Procedure: BALLOON DILATION;  Surgeon: Malissa Hippo, MD;  [facility];  Service: Endoscopy;  Laterality: N/A;  pyloric channel dialtaion    . CHOLECYSTECTOMY    . ESOPHAGEAL DILATION N/A 03/03/2016   Procedure: ESOPHAGEAL DILATION;  Surgeon: Malissa Hippo, MD;  [facility];  Service: Endoscopy;  Laterality: N/A;  . ESOPHAGEAL DILATION N/A 05/07/2016   Procedure: ESOPHAGEAL DILATION;  Surgeon: Malissa Hippo, MD;  [facility];  Service: Endoscopy;  Laterality: N/A;  . ESOPHAGEAL DILATION  01/26/2018   Procedure: DILATION OF ANASTOMOTIC  STRICTURE;  Surgeon: Malissa Hippo, MD;  [facility];  Service: Endoscopy;;  . ESOPHAGOGASTRODUODENOSCOPY N/A 03/03/2016   Procedure: ESOPHAGOGASTRODUODENOSCOPY (EGD);  Surgeon: Malissa Hippo, MD;  [facility];  Service: Endoscopy;  Laterality: N/A;  2:00  . ESOPHAGOGASTRODUODENOSCOPY N/A 05/07/2016   Procedure: ESOPHAGOGASTRODUODENOSCOPY (EGD);  Surgeon: Malissa Hippo, MD;  [facility];  Service: Endoscopy;  Laterality: N/A;  855 - moved to 6/2 @ 10:15 - Ann notified pt  . ESOPHAGOGASTRODUODENOSCOPY N/A 12/28/2017   Procedure: ESOPHAGOGASTRODUODENOSCOPY (EGD) with stricture dilation;  Surgeon: Malissa Hippo, MD;  [facility];  Service: Endoscopy;  Laterality: N/A;  . ESOPHAGOGASTRODUODENOSCOPY N/A 01/26/2018   Procedure: ESOPHAGOGASTRODUODENOSCOPY (EGD);  Surgeon: Malissa Hippo, MD;  [facility];  Service: Endoscopy;  Laterality: N/A;  255  . ESOPHAGOGASTRODUODENOSCOPY (EGD) WITH PROPOFOL N/A 03/05/2016   Procedure: ESOPHAGOGASTRODUODENOSCOPY (EGD) WITH PROPOFOL Anastomotic stricture dilation ;  Surgeon: Malissa Hippo, MD;  [facility];  Service: Endoscopy;  Laterality: N/A;  to be done in OR under fluoro  . INTRAMEDULLARY (IM) NAIL INTERTROCHANTERIC Right 07/09/2018   Procedure: INTRAMEDULLARY (IM) NAIL INTERTROCHANTRIC;  Surgeon: Yolonda Kida, MD;  Location: [facility] OR;  Service: Orthopedics;  Laterality: Right;  . STOMACH SURGERY      Prior to Admission medications   Medication Sig Start  Date End Date Taking? Authorizing Provider  albuterol (PROVENTIL HFA;VENTOLIN HFA) 108 (90 Base) MCG/ACT inhaler Inhale 2 puffs into the lungs every 6 (six) hours as needed for wheezing or shortness of breath.    Yes [provider]  celecoxib (CELEBREX) 100 MG capsule Take 100 mg by mouth daily.  06/13/18  Yes [provider]  citalopram (CELEXA) 20 MG tablet Take 20 mg by mouth daily. 06/23/18  Yes [provider]  DULERA 200-5 MCG/ACT AERO Inhale 1 puff into the lungs 2 (two) times daily.  12/22/17  Yes [provider]  feeding supplement, ENSURE ENLIVE, (ENSURE ENLIVE) LIQD Take 237 mLs by mouth 3 (three) times daily between meals. 12/30/17  Yes Shon Hale, MD  Multiple Vitamin (MULTIVITAMIN WITH MINERALS) TABS tablet Take 1 tablet by mouth daily. 12/31/17  Yes Emokpae, Courage, MD  neomycin-bacitracin-polymyxin (NEOSPORIN) ointment Apply 1 application topically as needed for wound care.   Yes [provider]  SANTYL ointment Apply 1 application topically daily.  06/13/18  Yes [provider]  tetrahydrozoline 0.05 % ophthalmic solution Place 1 drop into both eyes daily as needed (dry eyes).   Yes [provider]  traZODone (DESYREL) 100 MG tablet Take 1 tablet (100 mg total)  by mouth at bedtime. 12/30/17  Yes Emokpae, Courage, MD  pantoprazole (PROTONIX) 40 MG tablet Take 1 tablet (40 mg total) by mouth daily before breakfast. Patient not taking: Reported on 07/08/2018 01/26/18   Malissa Hippo, MD  sulfamethoxazole-trimethoprim (BACTRIM DS,SEPTRA DS) 800-160 MG tablet Take 1 tablet by mouth 2 (two) times daily. for 10 days starting on 06/21/2018 06/21/18   [provider]  traMADol (ULTRAM) 50 MG tablet Take 1 tablet (50 mg total) by mouth every 6 (six) hours as needed for moderate pain. 07/09/18   Yolonda Kida, MD    Current Facility-Administered Medications  Medication Dose Route Frequency Provider Last Rate Last Dose    . acetaminophen (TYLENOL) tablet 650 mg  650 mg Oral Q6H PRN Yolonda Kida, MD   650 mg at 07/10/18 1743   Or  . acetaminophen (TYLENOL) suppository 650 mg  650 mg Rectal Q6H PRN Yolonda Kida, MD      . albuterol (PROVENTIL) (2.5 MG/3ML) 0.083% nebulizer solution 2.5 mg  2.5 mg Inhalation Q6H PRN Yolonda Kida, MD      . ammonium lactate (LAC-HYDRIN) 12 % lotion   Topical Daily Tyrone Nine, MD      . aspirin EC tablet 81 mg  81 mg Oral BID Yolonda Kida, MD   81 mg at 07/13/18 1009  . bisacodyl (DULCOLAX) suppository 10 mg  10 mg Rectal Daily PRN Tyrone Nine, MD      . ceFAZolin (ANCEF) IVPB 2g/100 mL premix  2 g Intravenous Q8H Yolonda Kida, MD 200 mL/hr at 07/13/18 1331 2 g at 07/13/18 1331  . citalopram (CELEXA) tablet 20 mg  20 mg Oral Daily Yolonda Kida, MD   20 mg at 07/13/18 1009  . collagenase (SANTYL) ointment   Topical Daily Hazeline Junker B, MD      . feeding supplement (ENSURE ENLIVE) (ENSURE ENLIVE) liquid 237 mL  237 mL Oral TID BM Yolonda Kida, MD   237 mL at 07/13/18 1009  . HYDROmorphone (DILAUDID) injection 0.5 mg  0.5 mg Intravenous Q3H PRN Hazeline Junker B, MD   0.5 mg at 07/13/18 1053  . ibuprofen (ADVIL,MOTRIN) tablet 400 mg  400 mg Oral TID WC Tyrone Nine, MD   400 mg at 07/13/18 1219  . metoCLOPramide (REGLAN) tablet 5-10 mg  5-10 mg Oral Q8H PRN Yolonda Kida, MD   5 mg at 07/13/18 1329   Or  . metoCLOPramide (REGLAN) injection 5-10 mg  5-10 mg Intravenous Q8H PRN Yolonda Kida, MD   10 mg at 07/12/18 1927  . mometasone-formoterol (DULERA) 200-5 MCG/ACT inhaler 1 puff  1 puff Inhalation BID Yolonda Kida, MD   1 puff at 07/13/18 0730  . multivitamin with minerals tablet 1 tablet  1 tablet Oral Daily Tyrone Nine, MD   1 tablet at 07/13/18 1009  . naphazoline-glycerin (CLEAR EYES REDNESS) ophth solution 1-2 drop  1-2 drop Both Eyes QID PRN Yolonda Kida, MD      .  neomycin-bacitracin-polymyxin (NEOSPORIN) ointment 1 application  1 application Topical PRN Yolonda Kida, MD      . ondansetron [facility]) tablet 4 mg  4 mg Oral Q6H PRN Yolonda Kida, MD       Or  . ondansetron [facility]) injection 4 mg  4 mg Intravenous Q6H PRN Yolonda Kida, MD   4 mg at 07/13/18 1008  . oxyCODONE (Oxy IR/ROXICODONE) immediate release tablet 5  mg  5 mg Oral Q4H PRN Tyrone Nine, MD      . polyethylene glycol (MIRALAX / GLYCOLAX) packet 17 g  17 g Oral Daily Tyrone Nine, MD   17 g at 07/11/18 1043  . potassium chloride SA (K-DUR,KLOR-CON) CR tablet 40 mEq  40 mEq Oral BID Yolonda Kida, MD   40 mEq at 07/13/18 1009  . promethazine (PHENERGAN) 6.25 MG/5ML syrup 25 mg  25 mg Oral Q6H PRN Tyrone Nine, MD      . senna-docusate (Senokot-S) tablet 1 tablet  1 tablet Oral BID Tyrone Nine, MD   1 tablet at 07/13/18 1009  . traMADol (ULTRAM) tablet 50 mg  50 mg Oral Q6H PRN Tyrone Nine, MD      . traZODone (DESYREL) tablet 100 mg  100 mg Oral QHS Yolonda Kida, MD   100 mg at 07/12/18 2129    Allergies as of 07/08/2018 - Review Complete 07/08/2018  Allergen Reaction Noted  . Penicillins Itching, Swelling, and Other (See Comments) 02/03/2016    Family History  Problem Relation Age of Onset  . Alzheimer's disease Mother   . COPD Mother   . Throat cancer Father   . Breast cancer Sister     Social History   Socioeconomic History  . Marital status: Widowed    Spouse name: Not on file  . Number of children: Not on file  . Years of education: Not on file  . Highest education level: Not on file  Occupational History  . Not on file  Social Needs  . Financial resource strain: Not on file  . Food insecurity:    Worry: Not on file    Inability: Not on file  . Transportation needs:    Medical: Not on file    Non-medical: Not on file  Tobacco Use  . Smoking status: Current Every Day Smoker    Packs/day: 1.50    Years: 20.00     Pack years: 30.00    Types: Cigarettes  . Smokeless tobacco: Never Used  Substance and Sexual Activity  . Alcohol use: Yes    Alcohol/week: 12.0 standard drinks    Types: 12 Cans of beer per week    Comment: (beer)  3-4/WEEK    LAST BEER ON 8/2 (2)  . Drug use: No  . Sexual activity: Yes    Birth control/protection: None  Lifestyle  . Physical activity:    Days per week: Not on file    Minutes per session: Not on file  . Stress: Not on file  Relationships  . Social connections:    Talks on phone: Not on file    Gets together: Not on file    Attends religious service: Not on file    Active member of club or organization: Not on file    Attends meetings of clubs or organizations: Not on file    Relationship status: Not on file  . Intimate partner violence:    Fear of current or ex partner: Not on file    Emotionally abused: Not on file    Physically abused: Not on file    Forced sexual activity: Not on file  Other Topics Concern  . Not on file  Social History Narrative  . Not on file    Review of Systems: All systems reviewed and negative except where noted in HPI.  Physical Exam: Vital signs in last 24 hours: Temp:  [99.1 F (37.3 C)-99.9 F (  37.7 C)] 99.9 F (37.7 C) (08/08 0839) Pulse Rate:  [64-67] 65 (08/08 0839) Resp:  [18] 18 (08/08 0839) BP: (102-135)/(67-85) 103/67 (08/08 0839) SpO2:  [94 %-99 %] 99 % (08/08 0839) Last BM Date: 07/10/18 General:   Alert, emaciated female in NAD Psych:  Pleasant, cooperative. Normal mood and affect. Eyes:  Pupils equal, sclera clear, no icterus.   Conjunctiva pink. Ears:  Normal auditory acuity. Nose:  No deformity, discharge,  or lesions. Neck:  Supple; no masses Lungs:  Clear throughout to auscultation.   No wheezes, crackles, or rhonchi.  Heart:  Regular rate and rhythm; no murmurs, no edema Abdomen:  Soft, non-distended, nontender, BS active, no palp mass    Rectal:  Deferred  Msk:  Symmetrical without gross  deformities. . Neurologic:  Alert and  oriented x4;  grossly normal neurologically. Skin:  Dressings covering both distal lower extremities.   Intake/Output from previous day: 08/07 0701 - 08/08 0700 In: 931.1 [P.O.:500; IV Piggyback:431.1] Out: 0  Intake/Output this shift: Total I/O In: 250 [P.O.:250] Out: -   Lab Results: Recent Labs    07/11/18 0356  WBC 7.8  HGB 9.2*  HCT 31.0*  PLT 289   BMET Recent Labs    07/11/18 0356 07/13/18 0505  NA 137 138  K 4.8 3.2*  CL 104 97*  CO2 25 33*  GLUCOSE 86 98  BUN <5* 6*  CREATININE 0.53 0.44  CALCIUM 8.3* 8.3*    Studies/Results: No results found.   Willette Cluster, NP-C @  07/13/2018, 3:03 PM

## 2018-07-14 ENCOUNTER — Inpatient Hospital Stay (HOSPITAL_COMMUNITY): Payer: Medicaid Other | Admitting: Anesthesiology

## 2018-07-14 ENCOUNTER — Encounter (HOSPITAL_COMMUNITY)
Admission: EM | Disposition: A | Discharge status: Home Health | Payer: Self-pay | Source: Home / Self Care | Attending: Family Medicine

## 2018-07-14 ENCOUNTER — Encounter (HOSPITAL_COMMUNITY): Payer: Self-pay | Admitting: *Deleted

## 2018-07-14 HISTORY — PX: ESOPHAGOGASTRODUODENOSCOPY: SHX5428

## 2018-07-14 HISTORY — PX: BALLOON DILATION: SHX5330

## 2018-07-14 HISTORY — PX: BIOPSY: SHX5522

## 2018-07-14 LAB — COMPREHENSIVE METABOLIC PANEL
ALBUMIN: 2.2 g/dL — AB (ref 3.5–5.0)
ALT: 6 U/L (ref 0–44)
AST: 16 U/L (ref 15–41)
Alkaline Phosphatase: 57 U/L (ref 38–126)
Anion gap: 8 (ref 5–15)
BUN: 7 mg/dL — ABNORMAL LOW (ref 8–23)
CHLORIDE: 96 mmol/L — AB (ref 98–111)
CO2: 33 mmol/L — ABNORMAL HIGH (ref 22–32)
Calcium: 8.4 mg/dL — ABNORMAL LOW (ref 8.9–10.3)
Creatinine, Ser: 0.54 mg/dL (ref 0.44–1.00)
GFR calc Af Amer: >60 mL/min (ref >60)
GFR calc non Af Amer: >60 mL/min (ref >60)
GLUCOSE: 98 mg/dL (ref 70–99)
POTASSIUM: 3.3 mmol/L — AB (ref 3.5–5.1)
Sodium: 137 mmol/L (ref 135–145)
Total Bilirubin: 0.4 mg/dL (ref 0.3–1.2)
Total Protein: 5.1 g/dL — ABNORMAL LOW (ref 6.5–8.1)

## 2018-07-14 LAB — MAGNESIUM: Magnesium: 1.7 mg/dL (ref 1.7–2.4)

## 2018-07-14 SURGERY — EGD (ESOPHAGOGASTRODUODENOSCOPY)
Anesthesia: Monitor Anesthesia Care

## 2018-07-14 MED ORDER — LACTATED RINGERS IV SOLN
INTRAVENOUS | Status: DC | PRN
  Administered 2018-07-14: 13:00:00 via INTRAVENOUS

## 2018-07-14 MED ORDER — PHENYLEPHRINE 40 MCG/ML (10ML) SYRINGE FOR IV PUSH (FOR BLOOD PRESSURE SUPPORT)
PREFILLED_SYRINGE | INTRAVENOUS | Status: DC | PRN
  Administered 2018-07-14: 40 ug via INTRAVENOUS
  Administered 2018-07-14 (×2): 80 ug via INTRAVENOUS

## 2018-07-14 MED ORDER — LIDOCAINE HCL (CARDIAC) PF 100 MG/5ML IV SOSY
PREFILLED_SYRINGE | INTRAVENOUS | Status: DC | PRN
  Administered 2018-07-14: 30 mg via INTRAVENOUS

## 2018-07-14 MED ORDER — PROPOFOL 10 MG/ML IV BOLUS
INTRAVENOUS | Status: DC | PRN
  Administered 2018-07-14 (×2): 20 mg via INTRAVENOUS
  Administered 2018-07-14 (×2): 10 mg via INTRAVENOUS
  Administered 2018-07-14: 20 mg via INTRAVENOUS
  Administered 2018-07-14: 10 mg via INTRAVENOUS
  Administered 2018-07-14: 20 mg via INTRAVENOUS
  Administered 2018-07-14: 10 mg via INTRAVENOUS
  Administered 2018-07-14: 20 mg via INTRAVENOUS
  Administered 2018-07-14 (×4): 10 mg via INTRAVENOUS

## 2018-07-14 MED ORDER — CEFAZOLIN SODIUM-DEXTROSE 1-4 GM/50ML-% IV SOLN
1.0000 g | Freq: Three times a day (TID) | INTRAVENOUS | Status: DC
  Administered 2018-07-14 – 2018-07-15 (×4): 1 g via INTRAVENOUS
  Filled 2018-07-14 (×4): qty 50

## 2018-07-14 NOTE — Progress Notes (Signed)
Pt resting in bed, alert and oriented.  Pleasant.

## 2018-07-14 NOTE — Interval H&P Note (Signed)
History and Physical Interval Note:  07/14/2018 1:14 PM  Kristine Roberts  has presented today for surgery, with the diagnosis of nausea, vomiting, history of Bilroth surgery for PUD complicated by post-op anastomotic stricture requiring dilations  The various methods of treatment have been discussed with the patient and family. After consideration of risks, benefits and other options for treatment, the patient has consented to  Procedure(s): ESOPHAGOGASTRODUODENOSCOPY (EGD) (N/A) as a surgical intervention .  The patient's history has been reviewed, patient examined, no change in status, stable for surgery.  I have reviewed the patient's chart and labs.  Questions were answered to the patient's satisfaction.     Lynann Bologna

## 2018-07-14 NOTE — Transfer of Care (Signed)
Immediate Anesthesia Transfer of Care Note  Patient: Kristine Roberts  Procedure(s) Performed: ESOPHAGOGASTRODUODENOSCOPY (EGD) (N/A ) BIOPSY  Patient Location: PACU and Endoscopy Unit  Anesthesia Type:MAC  Level of Consciousness: awake, alert , oriented and patient cooperative  Airway & Oxygen Therapy: Patient Spontanous Breathing and Patient connected to nasal cannula oxygen  Post-op Assessment: Report given to RN and Post -op Vital signs reviewed and stable  Post vital signs: Reviewed and stable  Last Vitals:  Vitals Value Taken Time  BP    Temp    Pulse    Resp    SpO2      Last Pain:  Vitals:   07/14/18 1300  TempSrc: Oral  PainSc: 0-No pain      Patients Stated Pain Goal: 0 (60/45/40 9811)  Complications: No apparent anesthesia complications

## 2018-07-14 NOTE — Progress Notes (Signed)
Occupational Therapy Treatment Patient Details Name: Kristine Roberts MRN: [id number] DOB: 08-30-1956 Today's Date: 07/14/2018    History of present illness Pt presents s/p R IM nail hip fx after falling down 3 steps while avoiding a cat. Hx of chronic wounds in lower extremities, COPD, malnutrition, osteopenia.    OT comments  Pt making good progress toward OT goals. Pt demo improvement in clothing management and toilet transfer this session. Edu provided re DME usage and multi purpose for 3 in 1 commode. Discharge plan remains appropriate.    Follow Up Recommendations  Supervision - Intermittent;Home health OT    Equipment Recommendations  3 in 1 bedside commode;Other (comment)    Recommendations for Other Services      Precautions / Restrictions Precautions Precautions: Fall Restrictions Weight Bearing Restrictions: No       Mobility Bed Mobility Overal bed mobility: Modified Independent Bed Mobility: Supine to Sit;Sit to Supine           General bed mobility comments: Increased time and effort to progress edge of bed but no physical assistance required  Transfers Overall transfer level: Modified independent Equipment used: Rolling walker (2 wheeled) Transfers: Sit to/from Stand Sit to Stand: Modified independent (Device/Increase time)         General transfer comment: supervision for safety    Balance Overall balance assessment: Modified Independent Sitting-balance support: No upper extremity supported;Feet supported Sitting balance-Leahy Scale: Good     Standing balance support: Bilateral upper extremity supported Standing balance-Leahy Scale: Good Standing balance comment: (Pt improving on RW management)                           ADL either performed or assessed with clinical judgement   ADL                           Toilet Transfer: Min guard;Ambulation;RW;Cueing for safety   Toileting- Clothing Manipulation and Hygiene: Sit  to/from stand;Min guard;Cueing for safety       Functional mobility during ADLs: Rolling walker;Min guard General ADL Comments: (pt edu re DME/AE use and adjusting)     Vision       Perception     Praxis      Cognition Arousal/Alertness: Awake/alert Behavior During Therapy: WFL for tasks assessed/performed Overall Cognitive Status: Within Functional Limits for tasks assessed                                          Exercises Exercises: General Lower Extremity General Exercises - Lower Extremity Long Arc Quad: 10 reps;Right;Seated Hip Flexion/Marching: 10 reps;Both;Seated   Shoulder Instructions       General Comments      Pertinent Vitals/ Pain       Pain Assessment: Faces Faces Pain Scale: Hurts even more Pain Location: R hip with movement Pain Descriptors / Indicators: Grimacing;Operative site guarding Pain Intervention(s): Repositioned;Monitored during session  Home Living                                          Prior Functioning/Environment              Frequency  Min 2X/week        Progress Toward Goals  OT Goals(current goals can now be found in the care plan section)  Progress towards OT goals: Progressing toward goals  Acute Rehab OT Goals Patient Stated Goal: go home  Plan Discharge plan remains appropriate    Co-evaluation                 AM-PAC PT "6 Clicks" Daily Activity     Outcome Measure   Help from another person eating meals?: None Help from another person taking care of personal grooming?: A Little Help from another person toileting, which includes using toliet, bedpan, or urinal?: A Little Help from another person bathing (including washing, rinsing, drying)?: A Little Help from another person to put on and taking off regular upper body clothing?: A Little Help from another person to put on and taking off regular lower body clothing?: A Lot 6 Click Score: 18    End of  Session Equipment Utilized During Treatment: Gait belt;Rolling walker  OT Visit Diagnosis: Unsteadiness on feet (R26.81);Other abnormalities of gait and mobility (R26.89);Muscle weakness (generalized) (M62.81);History of falling (Z91.81);Pain Pain - Right/Left: Right Pain - part of body: Hip   Activity Tolerance Patient tolerated treatment well   Patient Left with call bell/phone within reach;with bed alarm set;in bed   Nurse Communication Mobility status        Time: 4098-1191 OT Time Calculation (min): 17 min  Charges: OT General Charges $OT Visit: 1 Visit OT Treatments $Self Care/Home Management : 8-22 mins   Crissie Reese OTR/L 07/14/2018, 3:48 PM

## 2018-07-14 NOTE — Op Note (Signed)
[facility] Patient Name: Kristine Roberts Procedure Date : 07/14/2018 MRN: [id number] Attending MD: Lynann Bologna , MD Date of Birth: 09/11/1956 CSN: [id number] Age: 62 Admit Type: Inpatient Procedure:                Upper GI endoscopy Indications:              Epigastric abdominal pain Providers:                Lynann Bologna, MD, Jacquiline Doe, RN, Margo Aye,                            Technician Referring MD:              Medicines:                Monitored Anesthesia Care Complications:            No immediate complications. Estimated Blood Loss:     Estimated blood loss was minimal. Procedure:                Pre-Anesthesia Assessment:                           - Prior to the procedure, a History and Physical                            was performed, and patient medications and                            allergies were reviewed. The patient's tolerance of                            previous anesthesia was also reviewed. The risks                            and benefits of the procedure and the sedation                            options and risks were discussed with the patient.                            All questions were answered, and informed consent                            was obtained. Prior Anticoagulants: The patient has                            taken no previous anticoagulant or antiplatelet                            agents. ASA Grade Assessment: III - A patient with                            severe systemic disease. After reviewing the risks  and benefits, the patient was deemed in                            satisfactory condition to undergo the procedure.                           After obtaining informed consent, the endoscope was                            passed under direct vision. Throughout the                            procedure, the patient's blood pressure, pulse, and                            oxygen saturations  were monitored continuously. The                            GIF-H190 ([id number] Olympus Adult EGD was introduced                            through the mouth, and advanced to the second part                            of duodenum. The upper GI endoscopy was                            accomplished without difficulty. The patient                            tolerated the procedure well. Scope In: Scope Out: Findings:      LA Grade C (one or more mucosal breaks continuous between tops of 2 or       more mucosal folds, less than 75% circumference) esophagitis with no       bleeding was found.      A small hiatal hernia was present.      Evidence of Billroth I gastroduodenostomy with moderate anastomotic       stricture with significant edema and gastritis, barely traversed by the       scope (GIF H190) was found. Small diverticulum just proximal to the       stricture. Gastric pouch with significant food debris limiting       examination. A TTS dilator was passed through the scope. Dilation with a       09-16-11 mm pyloric balloon dilator was performed. Some bleeding with 12       mm balloon. Hence, not dilated further. The dilation site was examined       and showed moderate improvement in luminal narrowing. Estimated blood       loss was minimal. Biopsies were taken with a cold forceps for histology.      The examined duodenum was normal. Impression:               - LA Grade C reflux esophagitis.                           -  Small hiatal hernia.                           - Billroth I gastroduodenostomy with significant                            anastomotic stricture with moderate gastritis.                            (Dilated. Biopsied).                           - Normal examined duodenum. Recommendation:           - Return patient to hospital ward for ongoing care.                           - Mechanical soft diet.                           - Use Protonix (pantoprazole) 40 mg PO daily.                            - No aspirin, ibuprofen, naproxen, or other                            non-steroidal anti-inflammatory drugs.                           - Return to GI clinic with Dr Karilyn Cota in 6 weeks.                            May need further dilatation. Procedure Code(s):        --- Professional ---                           [phone], Esophagogastroduodenoscopy, flexible,                            transoral; with dilation of gastric/duodenal                            stricture(s) (eg, balloon, bougie)                           43239, Esophagogastroduodenoscopy, flexible,                            transoral; with biopsy, single or multiple Diagnosis Code(s):        --- Professional ---                           K21.0, Gastro-esophageal reflux disease with                            esophagitis  K44.9, Diaphragmatic hernia without obstruction or                            gangrene                           Z98.0, Intestinal bypass and anastomosis status                           R10.13, Epigastric pain CPT copyright 2017 American Medical Association. All rights reserved. The codes documented in this report are preliminary and upon coder review may  be revised to meet current compliance requirements. Lynann Bologna, MD 07/14/2018 2:16:59 PM This report has been signed electronically. Number of Addenda: 0

## 2018-07-14 NOTE — Anesthesia Preprocedure Evaluation (Addendum)
Anesthesia Evaluation  Patient identified by MRN, date of birth, ID band Patient awake    Reviewed: Allergy & Precautions, H&P , NPO status , Patient's Chart, lab work & pertinent test results, reviewed documented beta blocker date and time   Airway Mallampati: I  TM Distance: >3 FB Neck ROM: full    Dental no notable dental hx. (+) Edentulous Upper, Edentulous Lower   Pulmonary neg pulmonary ROS, COPD, Current Smoker,    Pulmonary exam normal breath sounds clear to auscultation       Cardiovascular Exercise Tolerance: Good negative cardio ROS   Rhythm:Regular Rate:Normal     Neuro/Psych PSYCHIATRIC DISORDERS  Neuromuscular disease    GI/Hepatic Neg liver ROS, hiatal hernia, PUD, Gastric outlet obstruction Anastomotic stricture   Endo/Other  negative endocrine ROS  Renal/GU negative Renal ROS  negative genitourinary   Musculoskeletal   Abdominal (+) + scaphoid   Peds  Hematology  (+) anemia ,   Anesthesia Other Findings Malnutrition  Reproductive/Obstetrics negative OB ROS                                                                                   Lab Results  Component Value Date   WBC 7.8 07/11/2018   HGB 9.2 (L) 07/11/2018   HCT 31.0 (L) 07/11/2018   MCV 87.8 07/11/2018   PLT 289 07/11/2018   Lab Results  Component Value Date   CREATININE 0.54 07/14/2018   BUN 7 (L) 07/14/2018   NA 137 07/14/2018   K 3.3 (L) 07/14/2018   CL 96 (L) 07/14/2018   CO2 33 (H) 07/14/2018       Anesthesia Quick Evaluation  Anesthesia Physical  Anesthesia Plan  ASA: III  Anesthesia Plan: MAC   Post-op Pain Management:    Induction: Intravenous  PONV Risk Score and Plan: 3 and Treatment may vary due to age or medical condition  Airway Management Planned: Simple Face Mask, Nasal Cannula and Natural Airway  Additional Equipment:   Intra-op Plan:    Post-operative Plan:   Informed Consent: I have reviewed the patients History and Physical, chart, labs and discussed the procedure including the risks, benefits and alternatives for the proposed anesthesia with the patient or authorized representative who has indicated his/her understanding and acceptance.   Dental Advisory Given  Plan Discussed with: CRNA, Anesthesiologist and Surgeon  Anesthesia Plan Comments:         Anesthesia Quick Evaluation

## 2018-07-14 NOTE — Progress Notes (Signed)
Physical Therapy Treatment Patient Details Name: Kristine Roberts MRN: [id number] DOB: 01/31/1956 Today's Date: 07/14/2018    History of Present Illness Pt presents s/p R IM nail hip fx after falling down 3 steps while avoiding a cat. Hx of chronic wounds in lower extremities, COPD, malnutrition, osteopenia.     PT Comments    Patient continues with good participation and motivation in therapy session. Increased ambulation distance to 190 feet using walker and supervision. Focus on gait training with step through pattern, wider BOS, and equal step lengths. Continues to rely moderately on walker for support. Educated on cryotherapy and elevation to decrease inflammation.    Follow Up Recommendations  Home health PT     Equipment Recommendations  Rolling walker with 5" wheels;3in1 (PT)    Recommendations for Other Services       Precautions / Restrictions Precautions Precautions: None Restrictions Weight Bearing Restrictions: No    Mobility  Bed Mobility Overal bed mobility: Modified Independent Bed Mobility: Supine to Sit;Sit to Supine           General bed mobility comments: Increased time and effort to progress edge of bed but no physical assistance required  Transfers Overall transfer level: Modified independent Equipment used: Rolling walker (2 wheeled) Transfers: Sit to/from Stand Sit to Stand: Modified independent (Device/Increase time)            Ambulation/Gait Ambulation/Gait assistance: Supervision Gait Distance (Feet): 190 Feet Assistive device: Rolling walker (2 wheeled) Gait Pattern/deviations: Step-through pattern;Decreased step length - left;Decreased stride length;Decreased dorsiflexion - right;Step-to pattern;Narrow base of support Gait velocity: decreased   General Gait Details: Cues for wider BOS, step length, moving feet when turning rather than pivoting, scapular depression, and looking up during ambulation for postural  re-education.   Stairs             Wheelchair Mobility    Modified Rankin (Stroke Patients Only)       Balance Overall balance assessment: Modified Independent Sitting-balance support: No upper extremity supported;Feet supported Sitting balance-Leahy Scale: Good     Standing balance support: During functional activity;Single extremity supported Standing balance-Leahy Scale: Good                              Cognition Arousal/Alertness: Awake/alert Behavior During Therapy: WFL for tasks assessed/performed Overall Cognitive Status: Within Functional Limits for tasks assessed                                        Exercises General Exercises - Lower Extremity Long Arc Quad: 10 reps;Right;Seated Hip Flexion/Marching: 10 reps;Both;Seated    General Comments        Pertinent Vitals/Pain Pain Assessment: Faces Faces Pain Scale: Hurts even more Pain Location: R hip with movement Pain Descriptors / Indicators: Grimacing;Operative site guarding Pain Intervention(s): Monitored during session    Home Living                      Prior Function            PT Goals (current goals can now be found in the care plan section) Acute Rehab PT Goals Patient Stated Goal: go home PT Goal Formulation: With patient Time For Goal Achievement: 07/24/18 Potential to Achieve Goals: Good Progress towards PT goals: Progressing toward goals    Frequency    Min 5X/week  PT Plan Current plan remains appropriate    Co-evaluation              AM-PAC PT "6 Clicks" Daily Activity  Outcome Measure  Difficulty turning over in bed (including adjusting bedclothes, sheets and blankets)?: A Little Difficulty moving from lying on back to sitting on the side of the bed? : A Little Difficulty sitting down on and standing up from a chair with arms (e.g., wheelchair, bedside commode, etc,.)?: A Lot Help needed moving to and from a bed to  chair (including a wheelchair)?: A Lot Help needed walking in hospital room?: A Little Help needed climbing 3-5 steps with a railing? : A Lot 6 Click Score: 15    End of Session   Activity Tolerance: Patient tolerated treatment well Patient left: with call bell/phone within reach;in bed Nurse Communication: Mobility status PT Visit Diagnosis: Unsteadiness on feet (R26.81);Other abnormalities of gait and mobility (R26.89);Pain Pain - Right/Left: Right Pain - part of body: Hip     Time: 6195-0932 PT Time Calculation (min) (ACUTE ONLY): 23 min  Charges:  $Gait Training: 23-37 mins                     Laurina Bustle, PT, DPT Acute Rehabilitation Services  Pager: [phone]    Vanetta Mulders 07/14/2018, 2:23 PM

## 2018-07-14 NOTE — Progress Notes (Addendum)
PROGRESS NOTE  Desirey Veerkamp  LPN:[id number] DOB: 1956-10-06 DOA: 07/08/2018 PCP: [facility]   Brief Narrative: Kristine Roberts is a 62 y.o. female with a history of COPD, malnutrition, osteopenia, and PUD s/p billroth complicated by chronic GOO/anastomotic stricture who presented to the ED after a fall from home, having landed on the right hip with immediate pain and inability to bear weight, found to have a closed, right impaction fracture involving the lesser trochanter. Orthopedics was consulted and she had ORIF with IM implant 8/4 by Dr. Aundria Rud. Post operative course complicated by acute on chronic nausea and vomiting which has left her reliant on IV pain medications. This has been going on for several days, so GI is consulted for further recommendations. CT abdomen showed significant gastric contents, no physical obstruction. EGD planned 8/9.   Assessment & Plan: Principal Problem:   Closed right hip fracture (HCC) Active Problems:   COPD (chronic obstructive pulmonary disease) (HCC)   Protein-calorie malnutrition, severe   Hypokalemia   Cellulitis of both lower extremities  Acute, closed, intertrochanteric fracture of right hip: s/p IM implant 8/4 by Dr. Aundria Rud.  - WBAT RLE - Pain control: Multimodal with tylenol, NSAID, oxycodone prn. If can tolerate po only, can DC. Otherwise, has dilaudid IV available.  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 Last dilatated Feb 2019 and has gained weight since then, but having severe vomiting at baseline, worse now. - Due to refractory symptoms, I've asked for GI evaluation. EGD 8/9. - Added phenergan low dose, ineffective, increased to 25mg  po dosing.  - Continue zofran, reglan - Plan to discharge home once vomiting is controlled.  Acute blood loss anemia due to surgery: Suspect some reequilibration post op, as hgb has returned to 9.2 with no intervention.  - Recommend recheck CBC at follow  up.   Severe protein calorie malnutrition: BMI is 14.  - Dietitian assistance appreciated. Protein supplementation as tolerated  Chronic LE wounds with cellulitis: Cellulitis is improving.  - Continue empiric ancef for nonpurulent SSTI, stop date 8/10 (7 days). PCN allergy noted, though pt is tolerating 1st gen ceph.  - Wound care RN has been consulted, will follow local wound care recommendations. Follow up with wound care center as outpatient.    Left kidney cystic lesion: Noted on CT abd 07/13/2018. Complex cystic lesion with internal septations and mural nodularity in the upper pole of the left kidney, characterized as Bosniak class 2 Fahrenheit on today's CT examination, suspicious for renal neoplasm.  - Per radiology recommendations: Will need nonemergent MRI of the abdomen with and without IV gadolinium in the near future to better evaluate this lesion. This was discussed with the patient today.  Hypokalemia: Due to GI losses, poor po. Has not been getting supplement.  - Replace and recheck in AM with Mg  COPD: No exacerbation - BDs, no steroids  Depression:  - Continue SSRI, qHS trazodone  DVT prophylaxis: ASA BID, SCDs Code Status: Full Family Communication: Boyfriend at bedside Disposition Plan: Home once not reliant on IV medications. Possibly 24 hours pending GI evaluation/procedure plans.  Consultants:   Orthopedics, Dr. Carylon Perches GI  Procedures:   07/09/18 INTRAMEDULLARY (IM) NAIL INTERTROCHANTRIC Yolonda Kida, MD   8/9 EGD  Antimicrobials:  Ancef 8/3 - 8/10   Subjective: Improving pain, but still with frequent emesis.  Objective: Vitals:   07/14/18 0648 07/14/18 0822 07/14/18 0906 07/14/18 1300  BP:   (!) 94/59 101/62  Pulse: Marland Kitchen)  58  (!) 55 (!) 59  Resp:   18 (!) 21  Temp:   98.4 F (36.9 C) (!) 89.9 F (32.2 C)  TempSrc:   Oral Oral  SpO2: 99% 98% 97% 98%  Weight:      Height:        Intake/Output Summary (Last 24 hours) at  07/14/2018 1314 Last data filed at 07/14/2018 0644 Gross per 24 hour  Intake 328.53 ml  Output 0 ml  Net 328.53 ml   Filed Weights   07/08/18 2328 07/10/18 2054 07/12/18 0504  Weight: 35.1 kg 35.1 kg 35.1 kg   Gen: 62 y.o. female in no distress Pulm: Nonlabored breathing room air. Clear. CV: Regular rate and rhythm. No murmur, rub, or gallop. No JVD, no dependent edema. GI: Abdomen soft, non-tender, non-distended, with normoactive bowel sounds.  Ext: Warm, no deformities. Incision dressing over lateral right thigh with periwound swelling without fluctuance.  Skin: Lower legs with very mild erythema, improved. Scattered covered wounds are not draining, no odor. dressings c/d/i.  Neuro: Alert and oriented. No focal neurological deficits. Psych: Judgement and insight appear fair. Mood euthymic & affect congruent. Behavior is appropriate.    Data Reviewed: I have personally reviewed following labs and imaging studies  CBC: Recent Labs  Lab 07/08/18 1710 07/09/18 0404 07/11/18 0356  WBC 10.6* 5.6 7.8  NEUTROABS 9.0*  --   --   HGB 9.2* 7.6* 9.2*  HCT 30.6* 26.5* 31.0*  MCV 87.2 90.1 87.8  PLT 349 292 289   Basic Metabolic Panel: Recent Labs  Lab 07/08/18 1710 07/09/18 0633 07/11/18 0356 07/13/18 0505 07/14/18 0430  NA 136 139 137 138 137  K 3.0* 4.5 4.8 3.2* 3.3*  CL 104 110 104 97* 96*  CO2 24 22 25  33* 33*  GLUCOSE 89 73 86 98 98  BUN 13 9 <5* 6* 7*  CREATININE 0.72 0.55 0.53 0.44 0.54  CALCIUM 8.5* 8.0* 8.3* 8.3* 8.4*  MG 2.1  --   --   --  1.7   GFR: Estimated Creatinine Clearance: 40.4 mL/min (by C-G formula based on SCr of 0.54 mg/dL). Liver Function Tests: Recent Labs  Lab 07/14/18 0430  AST 16  ALT 6  ALKPHOS 57  BILITOT 0.4  PROT 5.1*  ALBUMIN 2.2*   No results for input(s): LIPASE, AMYLASE in the last 168 hours. No results for input(s): AMMONIA in the last 168 hours. Coagulation Profile: Recent Labs  Lab 07/08/18 1710  INR 0.95   Cardiac  Enzymes: No results for input(s): CKTOTAL, CKMB, CKMBINDEX, TROPONINI in the last 168 hours. BNP (last 3 results) No results for input(s): PROBNP in the last 8760 hours. HbA1C: No results for input(s): HGBA1C in the last 72 hours. CBG: No results for input(s): GLUCAP in the last 168 hours. Lipid Profile: No results for input(s): CHOL, HDL, LDLCALC, TRIG, CHOLHDL, LDLDIRECT in the last 72 hours. Thyroid Function Tests: No results for input(s): TSH, T4TOTAL, FREET4, T3FREE, THYROIDAB in the last 72 hours. Anemia Panel: No results for input(s): VITAMINB12, FOLATE, FERRITIN, TIBC, IRON, RETICCTPCT in the last 72 hours. Urine analysis:    Component Value Date/Time   COLORURINE YELLOW 12/26/2017 1006   APPEARANCEUR CLEAR 12/26/2017 1006   LABSPEC 1.026 12/26/2017 1006   PHURINE 5.0 12/26/2017 1006   GLUCOSEU NEGATIVE 12/26/2017 1006   HGBUR NEGATIVE 12/26/2017 1006   BILIRUBINUR NEGATIVE 12/26/2017 1006   KETONESUR 5 (A) 12/26/2017 1006   PROTEINUR NEGATIVE 12/26/2017 1006   NITRITE NEGATIVE 12/26/2017  1006   LEUKOCYTESUR NEGATIVE 12/26/2017 1006   Recent Results (from the past 240 hour(s))  MRSA PCR Screening     Status: None   Collection Time: 07/08/18  8:00 PM  Result Value Ref Range Status   MRSA by PCR NEGATIVE NEGATIVE Final    Comment:        The GeneXpert MRSA Assay (FDA approved for NASAL specimens only), is one component of a comprehensive MRSA colonization surveillance program. It is not intended to diagnose MRSA infection nor to guide or monitor treatment for MRSA infections. Performed at [facility], [address], [place], [place] [address]       Radiology Studies: Ct Abdomen Pelvis W Contrast  Result Date: 07/13/2018 CLINICAL DATA:  62 year old female with history of nausea and vomiting. EXAM: CT ABDOMEN AND PELVIS WITH CONTRAST TECHNIQUE: Multidetector CT imaging of the abdomen and pelvis was performed using the standard protocol following bolus  administration of intravenous contrast. CONTRAST:  OMNIPAQUE IOHEXOL 300 MG/ML  SOLN COMPARISON:  None. FINDINGS: Lower chest: Atherosclerotic calcifications in the descending thoracic aorta, as well as the left anterior descending and right coronary arteries. Surgical clips adjacent to the gastroesophageal junction. Hepatobiliary: No suspicious cystic or solid hepatic lesions. No intra or extrahepatic biliary ductal dilatation. Status post cholecystectomy. Pancreas: No pancreatic mass. No pancreatic ductal dilatation. No pancreatic or peripancreatic fluid or inflammatory changes. Spleen: Unremarkable. Adrenals/Urinary Tract: In the upper pole of the left kidney there is a complex cystic lesion measuring 2.3 cm in diameter which has internal septations and a 7 mm enhancing mural nodule (axial image 95 of series 6). Right kidney and bilateral adrenal glands are normal in appearance. No hydroureteronephrosis. Urinary bladder is normal in appearance. Stomach/Bowel: Postoperative changes in the stomach, which appear to reflect prior gastrojejunostomy. Relatively large volume of ingested material in the stomach. No pathologic dilatation of small bowel or colon. The appendix is not confidently identified and may be surgically absent. Regardless, there are no inflammatory changes noted adjacent to the cecum to suggest the presence of an acute appendicitis at this time. Vascular/Lymphatic: Aortic atherosclerosis, without evidence of aneurysm or dissection in the abdominal or pelvic vasculature. No lymphadenopathy noted in the abdomen or pelvis. Reproductive: Status post hysterectomy. Ovaries are not confidently identified may be surgically absent or atrophic. Other: No significant volume of ascites.  No pneumoperitoneum. Musculoskeletal: Postoperative changes of gamma nail fixation of intertrochanteric hip fracture on the right side with surrounding soft tissue swelling, small amount of gas in the overlying  musculature, and lateral skin staples related to recent surgery. Chronic appearing compression fractures of superior endplate of T12, inferior endplate of L2 and superior endplate of L5, most severe at L2 where there is 30% loss of central vertebral body height. IMPRESSION: 1. Status post gastrojejunostomy. Relatively large volume of ingested material in the stomach, which could indicate anastomotic stricture. 2. Complex cystic lesion with internal septations and mural nodularity in the upper pole of the left kidney, characterized as Bosniak class 2 Fahrenheit on today's CT examination, suspicious for renal neoplasm. Further characterization with nonemergent MRI of the abdomen with and without IV gadolinium is strongly recommended in the near future to better evaluate this lesion. 3. Aortic atherosclerosis, in addition to at least 2 vessel coronary artery disease. Please note that although the presence of coronary artery calcium documents the presence of coronary artery disease, the severity of this disease and any potential stenosis cannot be assessed on this non-gated CT examination. Assessment for potential  risk factor modification, dietary therapy or pharmacologic therapy may be warranted, if clinically indicated. 4. Additional incidental findings, as above. Electronically Signed   By: Trudie Reed M.D.   On: 07/13/2018 21:48    Scheduled Meds: . [MAR Hold] ammonium lactate   Topical Daily  . [MAR Hold] aspirin EC  81 mg Oral BID  . [MAR Hold] citalopram  20 mg Oral Daily  . [MAR Hold] collagenase   Topical Daily  . [MAR Hold] feeding supplement (ENSURE ENLIVE)  237 mL Oral TID BM  . [MAR Hold] ibuprofen  400 mg Oral TID WC  . [MAR Hold] mometasone-formoterol  1 puff Inhalation BID  . [MAR Hold] multivitamin with minerals  1 tablet Oral Daily  . [MAR Hold] pantoprazole  40 mg Oral Q0600  . [MAR Hold] polyethylene glycol  17 g Oral Daily  . [MAR Hold] potassium chloride  40 mEq Oral BID  . [MAR  Hold] senna-docusate  1 tablet Oral BID  . [MAR Hold] traZODone  100 mg Oral QHS   Continuous Infusions: . [MAR Hold]  ceFAZolin (ANCEF) IV 2 g (07/14/18 0630)     LOS: 6 days   Time spent: 25 minutes.  Tyrone Nine, MD [facility] [email or web address] Password [email or web address] 07/14/2018, 1:14 PM

## 2018-07-15 MED ORDER — OXYCODONE HCL 20 MG/ML PO CONC
5.0000 mg | ORAL | Status: DC | PRN
  Administered 2018-07-15 – 2018-07-16 (×5): 5 mg via ORAL
  Filled 2018-07-15 (×5): qty 1

## 2018-07-15 MED ORDER — POTASSIUM CHLORIDE 20 MEQ/15ML (10%) PO SOLN
20.0000 meq | Freq: Every day | ORAL | Status: DC
  Administered 2018-07-15: 20 meq via ORAL
  Filled 2018-07-15 (×2): qty 15

## 2018-07-15 MED ORDER — ACETAMINOPHEN 160 MG/5ML PO SOLN: 500.0000 mg | Freq: Four times a day (QID) | ORAL | Status: DC | PRN

## 2018-07-15 NOTE — Progress Notes (Signed)
PROGRESS NOTE  Kristine Roberts  NOB:[id number] DOB: 17-Jun-1956 DOA: 07/08/2018 PCP: [facility], [facility]   Brief Narrative: Kristine Roberts is a 62 y.o. female with a history of COPD, malnutrition, osteopenia, and PUD s/p billroth complicated by chronic GOO/anastomotic stricture who presented to the ED after a fall from home, having landed on the right hip with immediate pain and inability to bear weight, found to have a closed, right impaction fracture involving the lesser trochanter. Orthopedics was consulted and she had ORIF with IM implant 8/4 by Dr. Aundria Rud. Post operative course complicated by acute on chronic nausea and vomiting which has left her reliant on IV pain medications. This has been going on for several days, so GI is consulted for further recommendations. CT abdomen showed significant gastric contents, no physical obstruction. EGD planned 8/9.   Assessment & Plan: Principal Problem:   Closed right hip fracture (HCC) Active Problems:   COPD (chronic obstructive pulmonary disease) (HCC)   Protein-calorie malnutrition, severe   Hypokalemia   Cellulitis of both lower extremities  Acute, closed, intertrochanteric fracture of right hip: s/p IM implant 8/4 by Dr. Aundria Rud.  - WBAT RLE - Pain control: Multimodal with tylenol, oxycodone (liquid) prn. If can tolerate po only, can DC. Otherwise, has dilaudid IV available.  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 Last dilatated Feb 2019 and has gained weight since then, but having severe vomiting at baseline, worse now. - s/p EGD with dilatation 8/9 to 70mm. Also noted gastritis, esophagitis.  - Hold ibuprofen.  - Continue antiemetics, though this is not largely a chemically mediated problem. - Plan to discharge home once vomiting is controlled.  Acute blood loss anemia due to surgery: Suspect some reequilibration post op, as hgb has returned to 9.2 with no intervention.  - Recommend recheck  CBC at follow up.   Severe protein calorie malnutrition: BMI is 14.  - Dietitian assistance appreciated. Protein supplementation as tolerated  Chronic LE wounds with cellulitis: Cellulitis is improving.  - Continue empiric ancef for nonpurulent SSTI, stop date today (7 days). PCN allergy noted, though pt is tolerating 1st gen ceph.  - Wound care RN has been consulted, will follow local wound care recommendations. Follow up with wound care center as outpatient.    Left kidney cystic lesion: Noted on CT abd 07/13/2018. Complex cystic lesion with internal septations and mural nodularity in the upper pole of the left kidney, characterized as Bosniak class 2 Fahrenheit on today's CT examination, suspicious for renal neoplasm.  - Per radiology recommendations: Will need nonemergent MRI of the abdomen with and without IV gadolinium in the near future to better evaluate this lesion. This was discussed with the patient today.  Hypokalemia: Due to GI losses, poor po. Has not been getting supplement.  - Continues not to be getting supplement, recheck in AM.  COPD: No exacerbation - BDs, no steroids  Depression:  - Continue SSRI, qHS trazodone  DVT prophylaxis: ASA BID, SCDs Code Status: Full Family Communication: Boyfriend at bedside Disposition Plan: Still have required IV pain medications, but will attempt to wean and discharge home in next 24 hours.  Consultants:   Orthopedics, Dr. Carylon Perches GI  Procedures:   07/09/18 INTRAMEDULLARY (IM) NAIL INTERTROCHANTRIC Yolonda Kida, MD   8/9 EGD with dilatation  Antimicrobials:  Ancef 8/3 - 8/10   Subjective: Still having emesis. No abdominal pain. Right thigh/leg pain is stable.   Objective: Vitals:   07/14/18 2129 07/15/18 9476  07/15/18 0758 07/15/18 0958  BP: 130/74 123/81  98/69  Pulse: 60 60  64  Resp: 12 14  16   Temp: 99.2 F (37.3 C) 100.2 F (37.9 C)  99.4 F (37.4 C)  TempSrc: Oral Oral  Oral  SpO2:  95% 92%  94%  Weight:      Height:        Intake/Output Summary (Last 24 hours) at 07/15/2018 1556 Last data filed at 07/15/2018 0900 Gross per 24 hour  Intake 690 ml  Output 100 ml  Net 590 ml   Filed Weights   07/08/18 2328 07/10/18 2054 07/12/18 0504  Weight: 35.1 kg 35.1 kg 35.1 kg   Gen: 62 y.o. female in no distress Pulm: Nonlabored breathing room air. Clear. CV: Regular rate and rhythm. No murmur, rub, or gallop. No JVD, no dependent edema. GI: Abdomen soft, non-tender, non-distended, with normoactive bowel sounds.  Ext: Warm, no deformities. Surgical dressing c/d/i, appropriately tender.  Skin: No new rashes, lesions or ulcers on visualized skin. *Lower legs with chronic changes and improved erythema. No ecchymoses on right thigh wounds. Neuro: Alert and oriented. No focal neurological deficits. Psych: Judgement and insight appear fair. Mood euthymic & affect congruent. Behavior is appropriate.    Data Reviewed: I have personally reviewed following labs and imaging studies  CBC: Recent Labs  Lab 07/08/18 1710 07/09/18 0404 07/11/18 0356  WBC 10.6* 5.6 7.8  NEUTROABS 9.0*  --   --   HGB 9.2* 7.6* 9.2*  HCT 30.6* 26.5* 31.0*  MCV 87.2 90.1 87.8  PLT 349 292 289   Basic Metabolic Panel: Recent Labs  Lab 07/08/18 1710 07/09/18 0633 07/11/18 0356 07/13/18 0505 07/14/18 0430  NA 136 139 137 138 137  K 3.0* 4.5 4.8 3.2* 3.3*  CL 104 110 104 97* 96*  CO2 24 22 25  33* 33*  GLUCOSE 89 73 86 98 98  BUN 13 9 <5* 6* 7*  CREATININE 0.72 0.55 0.53 0.44 0.54  CALCIUM 8.5* 8.0* 8.3* 8.3* 8.4*  MG 2.1  --   --   --  1.7   GFR: Estimated Creatinine Clearance: 40.4 mL/min (by C-G formula based on SCr of 0.54 mg/dL). Liver Function Tests: Recent Labs  Lab 07/14/18 0430  AST 16  ALT 6  ALKPHOS 57  BILITOT 0.4  PROT 5.1*  ALBUMIN 2.2*   No results for input(s): LIPASE, AMYLASE in the last 168 hours. No results for input(s): AMMONIA in the last 168 hours. Coagulation  Profile: Recent Labs  Lab 07/08/18 1710  INR 0.95   Cardiac Enzymes: No results for input(s): CKTOTAL, CKMB, CKMBINDEX, TROPONINI in the last 168 hours. BNP (last 3 results) No results for input(s): PROBNP in the last 8760 hours. HbA1C: No results for input(s): HGBA1C in the last 72 hours. CBG: No results for input(s): GLUCAP in the last 168 hours. Lipid Profile: No results for input(s): CHOL, HDL, LDLCALC, TRIG, CHOLHDL, LDLDIRECT in the last 72 hours. Thyroid Function Tests: No results for input(s): TSH, T4TOTAL, FREET4, T3FREE, THYROIDAB in the last 72 hours. Anemia Panel: No results for input(s): VITAMINB12, FOLATE, FERRITIN, TIBC, IRON, RETICCTPCT in the last 72 hours. Urine analysis:    Component Value Date/Time   COLORURINE YELLOW 12/26/2017 1006   APPEARANCEUR CLEAR 12/26/2017 1006   LABSPEC 1.026 12/26/2017 1006   PHURINE 5.0 12/26/2017 1006   GLUCOSEU NEGATIVE 12/26/2017 1006   HGBUR NEGATIVE 12/26/2017 1006   BILIRUBINUR NEGATIVE 12/26/2017 1006   KETONESUR 5 (A) 12/26/2017 1006  PROTEINUR NEGATIVE 12/26/2017 1006   NITRITE NEGATIVE 12/26/2017 1006   LEUKOCYTESUR NEGATIVE 12/26/2017 1006   Recent Results (from the past 240 hour(s))  MRSA PCR Screening     Status: None   Collection Time: 07/08/18  8:00 PM  Result Value Ref Range Status   MRSA by PCR NEGATIVE NEGATIVE Final    Comment:        The GeneXpert MRSA Assay (FDA approved for NASAL specimens only), is one component of a comprehensive MRSA colonization surveillance program. It is not intended to diagnose MRSA infection nor to guide or monitor treatment for MRSA infections. Performed at [facility], [address], [place], [place] [address]       Radiology Studies: Ct Abdomen Pelvis W Contrast  Result Date: 07/13/2018 CLINICAL DATA:  62 year old female with history of nausea and vomiting. EXAM: CT ABDOMEN AND PELVIS WITH CONTRAST TECHNIQUE: Multidetector CT imaging of the abdomen and pelvis  was performed using the standard protocol following bolus administration of intravenous contrast. CONTRAST:  OMNIPAQUE IOHEXOL 300 MG/ML  SOLN COMPARISON:  None. FINDINGS: Lower chest: Atherosclerotic calcifications in the descending thoracic aorta, as well as the left anterior descending and right coronary arteries. Surgical clips adjacent to the gastroesophageal junction. Hepatobiliary: No suspicious cystic or solid hepatic lesions. No intra or extrahepatic biliary ductal dilatation. Status post cholecystectomy. Pancreas: No pancreatic mass. No pancreatic ductal dilatation. No pancreatic or peripancreatic fluid or inflammatory changes. Spleen: Unremarkable. Adrenals/Urinary Tract: In the upper pole of the left kidney there is a complex cystic lesion measuring 2.3 cm in diameter which has internal septations and a 7 mm enhancing mural nodule (axial image 95 of series 6). Right kidney and bilateral adrenal glands are normal in appearance. No hydroureteronephrosis. Urinary bladder is normal in appearance. Stomach/Bowel: Postoperative changes in the stomach, which appear to reflect prior gastrojejunostomy. Relatively large volume of ingested material in the stomach. No pathologic dilatation of small bowel or colon. The appendix is not confidently identified and may be surgically absent. Regardless, there are no inflammatory changes noted adjacent to the cecum to suggest the presence of an acute appendicitis at this time. Vascular/Lymphatic: Aortic atherosclerosis, without evidence of aneurysm or dissection in the abdominal or pelvic vasculature. No lymphadenopathy noted in the abdomen or pelvis. Reproductive: Status post hysterectomy. Ovaries are not confidently identified may be surgically absent or atrophic. Other: No significant volume of ascites.  No pneumoperitoneum. Musculoskeletal: Postoperative changes of gamma nail fixation of intertrochanteric hip fracture on the right side with surrounding soft tissue  swelling, small amount of gas in the overlying musculature, and lateral skin staples related to recent surgery. Chronic appearing compression fractures of superior endplate of T12, inferior endplate of L2 and superior endplate of L5, most severe at L2 where there is 30% loss of central vertebral body height. IMPRESSION: 1. Status post gastrojejunostomy. Relatively large volume of ingested material in the stomach, which could indicate anastomotic stricture. 2. Complex cystic lesion with internal septations and mural nodularity in the upper pole of the left kidney, characterized as Bosniak class 2 Fahrenheit on today's CT examination, suspicious for renal neoplasm. Further characterization with nonemergent MRI of the abdomen with and without IV gadolinium is strongly recommended in the near future to better evaluate this lesion. 3. Aortic atherosclerosis, in addition to at least 2 vessel coronary artery disease. Please note that although the presence of coronary artery calcium documents the presence of coronary artery disease, the severity of this disease and any potential stenosis cannot be  assessed on this non-gated CT examination. Assessment for potential risk factor modification, dietary therapy or pharmacologic therapy may be warranted, if clinically indicated. 4. Additional incidental findings, as above. Electronically Signed   By: Trudie Reed M.D.   On: 07/13/2018 21:48    Scheduled Meds: . ammonium lactate   Topical Daily  . aspirin EC  81 mg Oral BID  . citalopram  20 mg Oral Daily  . collagenase   Topical Daily  . feeding supplement (ENSURE ENLIVE)  237 mL Oral TID BM  . mometasone-formoterol  1 puff Inhalation BID  . multivitamin with minerals  1 tablet Oral Daily  . pantoprazole  40 mg Oral Q0600  . polyethylene glycol  17 g Oral Daily  . potassium chloride  40 mEq Oral BID  . senna-docusate  1 tablet Oral BID  . traZODone  100 mg Oral QHS   Continuous Infusions: .  ceFAZolin (ANCEF)  IV 1 g (07/15/18 0525)     LOS: 7 days   Time spent: 25 minutes.  Tyrone Nine, MD [facility] [email or web address] Password [facility] 07/15/2018, 3:56 PM

## 2018-07-16 DIAGNOSIS — S72001D Fracture of unspecified part of neck of right femur, subsequent encounter for closed fracture with routine healing: Secondary | ICD-10-CM

## 2018-07-16 LAB — BASIC METABOLIC PANEL
ANION GAP: 9 (ref 5–15)
BUN: 10 mg/dL (ref 8–23)
CALCIUM: 8.5 mg/dL — AB (ref 8.9–10.3)
CHLORIDE: 98 mmol/L (ref 98–111)
CO2: 30 mmol/L (ref 22–32)
Creatinine, Ser: 0.47 mg/dL (ref 0.44–1.00)
GFR calc Af Amer: >60 mL/min (ref >60)
GFR calc non Af Amer: >60 mL/min (ref >60)
Glucose, Bld: 93 mg/dL (ref 70–99)
Potassium: 3.9 mmol/L (ref 3.5–5.1)
SODIUM: 137 mmol/L (ref 135–145)

## 2018-07-16 MED ORDER — PANTOPRAZOLE SODIUM 40 MG PO PACK: 40.0000 mg | PACK | Freq: Every day | ORAL | 0 refills | Status: DC

## 2018-07-16 MED ORDER — OXYCODONE HCL 5 MG/5ML PO SOLN: 2.5000 mg | Freq: Four times a day (QID) | ORAL | 0 refills | Status: AC | PRN

## 2018-07-16 MED ORDER — PROMETHAZINE HCL 6.25 MG/5ML PO SYRP: 12.5000 mg | ORAL_SOLUTION | Freq: Four times a day (QID) | ORAL | 0 refills | Status: DC | PRN

## 2018-07-16 NOTE — Discharge Summary (Signed)
Physician Discharge Summary  Kristine Roberts PJA:[id number] DOB: [birth year] DOA: 07/08/2018  PCP: Randell Patient [facility]  Admit date: 07/08/2018 Discharge date: 07/16/2018  Admitted From: Home Disposition: Home   Recommendations for Outpatient Follow-up:  1. Follow up with PCP in 1-2 weeks 2. Please obtain BMP/CBC in one week 3. Per radiology recommendations regarding a cystic lesion of left kidney: Will need nonemergent MRI of the abdomen with and without IV gadolinium in the near future to better evaluate this lesion. This was discussed with the patient.  Home Health: PT, OT Equipment/Devices: None new Discharge Condition: Stable CODE STATUS: Full Diet recommendation: As tolerated  Brief/Interim Summary: Kristine Roberts is a 62 y.o. female with a history of COPD, malnutrition, osteopenia, and PUD s/p billroth complicated by chronic GOO/anastomotic stricture who presented to the ED after a fall from home, having landed on the right hip with immediate pain and inability to bear weight, found to have a closed, right impaction fracture involving the lesser trochanter. Orthopedics was consulted and she had ORIF with IM implant 8/4 by Dr. Aundria Rud. Post operative course complicated by acute on chronic nausea and vomiting which has left her reliant on IV pain medications. This has been going on for several days, so GI is consulted for further recommendations. CT abdomen showed significant gastric contents, no physical obstruction. EGD planned 8/9 showed stenosis of gastrojejunostomy which was dilated with subsequent improvement in po tolerance.   Discharge Diagnoses:  Principal Problem:   Closed right hip fracture (HCC) Active Problems:   COPD (chronic obstructive pulmonary disease) (HCC)   Protein-calorie malnutrition, severe   Hypokalemia   Cellulitis of both lower extremities  Acute, closed, intertrochanteric fracture of right hip: s/p IM implant 8/4 by Dr. Aundria Rud.  - WBAT RLE - Pain  control: Multimodal with tylenol, oxycodone (liquid) prn.  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 Last dilatated Feb 2019 and has gained weight since then, but having severe vomiting at baseline, worse now. EGD by Dr. Chales Abrahams 8/9 with dilatation 8/9 to 71mm. Also noted gastritis, esophagitis.  - Hold ibuprofen.  - Continue antiemetics, though this is not largely a chemically mediated problem. - Plan to discharge home once vomiting is controlled.  Acute blood loss anemia due to surgery: Suspect some reequilibration post op, as hgb has returned to 9.2 with no intervention.  - Recommend recheck CBC at follow up.   Severe protein calorie malnutrition: BMI is 14.  - Dietitian assistance appreciated. Protein supplementation as tolerated  Chronic LE wounds with cellulitis: Cellulitis is improving.  - Continue empiric ancef for nonpurulent SSTI, stop date today (7 days). PCN allergy noted, though pt is tolerating 1st gen ceph.  - Wound care RN has been consulted, will follow local wound care recommendations. - Follow up with wound care center as outpatient.    Left kidney cystic lesion: Noted on CT abd 07/13/2018. Complex cystic lesion with internal septations and mural nodularity in the upper pole of the left kidney, characterized as Bosniak class 2 Fahrenheit on today's CT examination, suspicious for renal neoplasm.  - Per radiology recommendations: Will need nonemergent MRI of the abdomen with and without IV gadolinium in the near future to better evaluate this lesion. This was discussed with the patient today.  Hypokalemia: Due to GI losses, poor po. Improved when supplemented and when taking po.  COPD: No exacerbation - BDs, no steroids  Depression:  - Continue SSRI, qHS trazodone  Discharge Instructions Discharge Instructions  Diet - low sodium heart healthy   Complete by:  As directed    Discharge instructions   Complete by:  As directed     Take oxycodone and phenergan as needed for severe pain or severe nausea. These can be dangerous when taken together, so only take them as absolutely necessary. I have confirmed they have a sufficient quantity at your [facility]. You will also need to take something to suppress the acid in your stomach, so protonix solution was sent to your pharmacy. Take this every day.  - Follow up with your orthopedic surgeon and GI doctor in the next 2 weeks, or seek medical attention sooner if your pain worsens or you are unable to keep liquids down.   Increase activity slowly   Complete by:  As directed      Allergies as of 07/16/2018      Reactions   Penicillins Itching, Swelling, Other (See Comments)   Tongue swells Has patient had a PCN reaction causing immediate rash, facial/tongue/throat swelling, SOB or lightheadedness with hypotension: Yes, had a rash and tongue swelled up. No SOB and lightheadedness. Has patient had a PCN reaction causing severe rash involving mucus membranes or skin necrosis: No Has patient had a PCN reaction that required hospitalization: No Has patient had a PCN reaction occurring within the last 10 years: No If all of the above answers are "NO", then may procee      Medication List    STOP taking these medications   celecoxib 100 MG capsule Commonly known as:  CELEBREX   pantoprazole 40 MG tablet Commonly known as:  PROTONIX Replaced by:  pantoprazole sodium 40 mg/20 mL Pack   sulfamethoxazole-trimethoprim 800-160 MG tablet Commonly known as:  BACTRIM DS,SEPTRA DS     TAKE these medications   albuterol 108 (90 Base) MCG/ACT inhaler Commonly known as:  PROVENTIL HFA;VENTOLIN HFA Inhale 2 puffs into the lungs every 6 (six) hours as needed for wheezing or shortness of breath.   citalopram 20 MG tablet Commonly known as:  CELEXA Take 20 mg by mouth daily.   DULERA 200-5 MCG/ACT Aero Generic drug:  mometasone-formoterol Inhale 1 puff into the lungs 2 (two) times  daily.   feeding supplement (ENSURE ENLIVE) Liqd Take 237 mLs by mouth 3 (three) times daily between meals.   multivitamin with minerals Tabs tablet Take 1 tablet by mouth daily.   neomycin-bacitracin-polymyxin ointment Commonly known as:  NEOSPORIN Apply 1 application topically as needed for wound care.   oxyCODONE 5 MG/5ML solution Commonly known as:  ROXICODONE Take 2.5-5 mLs (2.5-5 mg total) by mouth every 6 (six) hours as needed for up to 5 days for moderate pain or severe pain.   pantoprazole sodium 40 mg/20 mL Pack Commonly known as:  PROTONIX Take 20 mLs (40 mg total) by mouth daily. Replaces:  pantoprazole 40 MG tablet   promethazine 6.25 MG/5ML syrup Commonly known as:  PHENERGAN Take 10 mLs (12.5 mg total) by mouth every 6 (six) hours as needed for refractory nausea / vomiting.   SANTYL ointment Generic drug:  collagenase Apply 1 application topically daily.   tetrahydrozoline 0.05 % ophthalmic solution Place 1 drop into both eyes daily as needed (dry eyes).   traZODone 100 MG tablet Commonly known as:  DESYREL Take 1 tablet (100 mg total) by mouth at bedtime.            Durable Medical Equipment  (From admission, onward)         Start  Ordered   07/12/18 1442  For home use only DME 3 n 1  Once     07/12/18 1441   07/12/18 1441  For home use only DME Walker rolling  Once    Question:  Patient needs a walker to treat with the following condition  Answer:  Femur fracture, right [facility])   07/12/18 1441         Follow-up Information    Yolonda Kida, MD In 2 weeks.   Specialty:  Orthopedic Surgery Why:  For suture removal Contact information: [address] [place] [address] [phone]        Health, [facility]-Home Follow up.   Specialty:  Home Health Services Why:  A representative from [facility] will contact you to arrange start date and time for your therapy. Contact information: [address] [place] [address] [phone]        [facility], [facility]. Schedule an appointment as soon as possible for a visit in 1 week(s).   Contact information: [address] [place] [address] [phone]        Malissa Hippo, MD Follow up.   Specialty:  Gastroenterology Contact information: [address] [place] [address] [phone]          Allergies  Allergen Reactions  . Penicillins Itching, Swelling and Other (See Comments)    Tongue swells Has patient had a PCN reaction causing immediate rash, facial/tongue/throat swelling, SOB or lightheadedness with hypotension: Yes, had a rash and tongue swelled up. No SOB and lightheadedness. Has patient had a PCN reaction causing severe rash involving mucus membranes or skin necrosis: No Has patient had a PCN reaction that required hospitalization: No Has patient had a PCN reaction occurring within the last 10 years: No If all of the above answers are "NO", then may procee    Consultations:  Orthopedics  GI  Procedures/Studies: Ct Abdomen Pelvis W Contrast  Result Date: 07/13/2018 CLINICAL DATA:  62 year old female with history of nausea and vomiting. EXAM: CT ABDOMEN AND PELVIS WITH CONTRAST TECHNIQUE: Multidetector CT imaging of the abdomen and pelvis was performed using the standard protocol following bolus administration of intravenous contrast. CONTRAST:  OMNIPAQUE IOHEXOL 300 MG/ML  SOLN COMPARISON:  None. FINDINGS: Lower chest: Atherosclerotic calcifications in the descending thoracic aorta, as well as the left anterior descending and right coronary arteries. Surgical clips adjacent to the gastroesophageal junction. Hepatobiliary: No suspicious cystic or solid hepatic lesions. No intra or extrahepatic biliary ductal dilatation. Status post cholecystectomy. Pancreas: No pancreatic mass. No pancreatic ductal dilatation. No pancreatic or peripancreatic fluid or inflammatory  changes. Spleen: Unremarkable. Adrenals/Urinary Tract: In the upper pole of the left kidney there is a complex cystic lesion measuring 2.3 cm in diameter which has internal septations and a 7 mm enhancing mural nodule (axial image 95 of series 6). Right kidney and bilateral adrenal glands are normal in appearance. No hydroureteronephrosis. Urinary bladder is normal in appearance. Stomach/Bowel: Postoperative changes in the stomach, which appear to reflect prior gastrojejunostomy. Relatively large volume of ingested material in the stomach. No pathologic dilatation of small bowel or colon. The appendix is not confidently identified and may be surgically absent. Regardless, there are no inflammatory changes noted adjacent to the cecum to suggest the presence of an acute appendicitis at this time. Vascular/Lymphatic: Aortic atherosclerosis, without evidence of aneurysm or dissection in the abdominal or pelvic vasculature. No lymphadenopathy noted in the abdomen  or pelvis. Reproductive: Status post hysterectomy. Ovaries are not confidently identified may be surgically absent or atrophic. Other: No significant volume of ascites.  No pneumoperitoneum. Musculoskeletal: Postoperative changes of gamma nail fixation of intertrochanteric hip fracture on the right side with surrounding soft tissue swelling, small amount of gas in the overlying musculature, and lateral skin staples related to recent surgery. Chronic appearing compression fractures of superior endplate of T12, inferior endplate of L2 and superior endplate of L5, most severe at L2 where there is 30% loss of central vertebral body height. IMPRESSION: 1. Status post gastrojejunostomy. Relatively large volume of ingested material in the stomach, which could indicate anastomotic stricture. 2. Complex cystic lesion with internal septations and mural nodularity in the upper pole of the left kidney, characterized as Bosniak class 2 Fahrenheit on today's CT examination,  suspicious for renal neoplasm. Further characterization with nonemergent MRI of the abdomen with and without IV gadolinium is strongly recommended in the near future to better evaluate this lesion. 3. Aortic atherosclerosis, in addition to at least 2 vessel coronary artery disease. Please note that although the presence of coronary artery calcium documents the presence of coronary artery disease, the severity of this disease and any potential stenosis cannot be assessed on this non-gated CT examination. Assessment for potential risk factor modification, dietary therapy or pharmacologic therapy may be warranted, if clinically indicated. 4. Additional incidental findings, as above. Electronically Signed   By: Trudie Reed M.D.   On: 07/13/2018 21:48   Ct Hip Right Wo Contrast  Result Date: 07/08/2018 CLINICAL DATA:  Right hip fracture after fall while running from a CT. EXAM: CT OF THE RIGHT HIP WITHOUT CONTRAST TECHNIQUE: Multidetector CT imaging of the right hip was performed according to the standard protocol. Multiplanar CT image reconstructions were also generated. COMPARISON:  Radiographs from 07/08/2018 FINDINGS: Bones/Joint/Cartilage There is an acute inter trochanteric fracture of the right femur with slight impaction of the medial femoral neck upon the inter trochanter. Fracture also extends and slightly undermines the greater trochanter. Joint space narrowing of the right hip is seen without joint dislocation. Spurring is identified across the femoral head-neck juncture. The included pubic rami and pubic symphysis appear intact. Ligaments Suboptimally assessed by CT. Muscles and Tendons No intramuscular hemorrhage or atrophy. Soft tissues No focal soft tissue hematoma. IMPRESSION: Acute closed intertrochanteric fracture of the right femur with slight impaction of the medial aspect of the femoral head upon the trochanteric portion of the femur. No joint dislocation. Electronically Signed   By: Tollie Eth M.D.   On: 07/08/2018 18:59   Dg C-arm 1-60 Min  Result Date: 07/09/2018 CLINICAL DATA:  Right hip fixation EXAM: DG C-ARM 61-120 MIN; OPERATIVE RIGHT HIP WITH PELVIS COMPARISON:  None. FLUOROSCOPY TIME:  Radiation Exposure Index (as provided by the fluoroscopic device): Not available If the device does not provide the exposure index: Fluoroscopy Time:  41 seconds Number of Acquired Images:  2 FINDINGS: Proximal medullary rod is noted with fixation screw traversing the femoral neck. The fracture fragments are in near anatomic alignment. IMPRESSION: ORIF of proximal right femoral fracture. Electronically Signed   By: Alcide Clever M.D.   On: 07/09/2018 15:07   Dg Hip Operative Unilat W Or W/o Pelvis Right  Result Date: 07/09/2018 CLINICAL DATA:  Right hip fixation EXAM: DG C-ARM 61-120 MIN; OPERATIVE RIGHT HIP WITH PELVIS COMPARISON:  None. FLUOROSCOPY TIME:  Radiation Exposure Index (as provided by the fluoroscopic device): Not available If the device does not  provide the exposure index: Fluoroscopy Time:  41 seconds Number of Acquired Images:  2 FINDINGS: Proximal medullary rod is noted with fixation screw traversing the femoral neck. The fracture fragments are in near anatomic alignment. IMPRESSION: ORIF of proximal right femoral fracture. Electronically Signed   By: Alcide Clever M.D.   On: 07/09/2018 15:07   Dg Hip Unilat With Pelvis 2-3 Views Right  Result Date: 07/08/2018 CLINICAL DATA:  Fall today outside, dodging a cat. Unable to straighten RIGHT leg. RIGHT hip pain. EXAM: DG HIP (WITH OR WITHOUT PELVIS) 2-3V RIGHT COMPARISON:  None. FINDINGS: The RIGHT hip appears foreshortened, accentuated by patient positioning. There is a linear lucency traversing the base of the femoral neck and mild irregularity of the lesser trochanter. Findings are consistent with impacted basicervical/intertrochanteric fracture. There is no dislocation. The LEFT hip appears intact. Bones appear radiolucent. IMPRESSION:  Impacted fracture of the RIGHT femoral neck, possibly involving the lesser trochanter. Greater trochanter involvement cannot be excluded. Consider CT if needed for further characterization. Electronically Signed   By: Norva Pavlov M.D.   On: 07/08/2018 17:10     07/09/18 INTRAMEDULLARY (IM) NAIL INTERTROCHANTRIC Yolonda Kida, MD   8/9 EGD with dilatation  Subjective: Feels better. Pain controlled on po only medications. Nausea and vomiting improved. Wants to go home.   Discharge Exam: Vitals:   07/16/18 1004 07/16/18 1008  BP: 105/75 105/75  Pulse: 63 63  Resp:  16  Temp: 98.9 F (37.2 C) 98.9 F (37.2 C)  SpO2: 100% 100%   General: Pt is alert, awake, not in acute distress Cardiovascular: RRR, S1/S2 +, no rubs, no gallops Respiratory: CTA bilaterally, no wheezing, no rhonchi Abdominal: Soft, NT, ND, bowel sounds + Extremities: Lower legs with chronic wounds without purulence. Erythema significantly improved, no edema, no cyanosis  Labs: BNP (last 3 results) No results for input(s): BNP in the last 8760 hours. Basic Metabolic Panel: Recent Labs  Lab 07/11/18 0356 07/13/18 0505 07/14/18 0430 07/16/18 0615  NA 137 138 137 137  K 4.8 3.2* 3.3* 3.9  CL 104 97* 96* 98  CO2 25 33* 33* 30  GLUCOSE 86 98 98 93  BUN <5* 6* 7* 10  CREATININE 0.53 0.44 0.54 0.47  CALCIUM 8.3* 8.3* 8.4* 8.5*  MG  --   --  1.7  --    Liver Function Tests: Recent Labs  Lab 07/14/18 0430  AST 16  ALT 6  ALKPHOS 57  BILITOT 0.4  PROT 5.1*  ALBUMIN 2.2*   No results for input(s): LIPASE, AMYLASE in the last 168 hours. No results for input(s): AMMONIA in the last 168 hours. CBC: Recent Labs  Lab 07/11/18 0356  WBC 7.8  HGB 9.2*  HCT 31.0*  MCV 87.8  PLT 289   Cardiac Enzymes: No results for input(s): CKTOTAL, CKMB, CKMBINDEX, TROPONINI in the last 168 hours. BNP: Invalid input(s): POCBNP CBG: No results for input(s): GLUCAP in the last 168 hours. D-Dimer No  results for input(s): DDIMER in the last 72 hours. Hgb A1c No results for input(s): HGBA1C in the last 72 hours. Lipid Profile No results for input(s): CHOL, HDL, LDLCALC, TRIG, CHOLHDL, LDLDIRECT in the last 72 hours. Thyroid function studies No results for input(s): TSH, T4TOTAL, T3FREE, THYROIDAB in the last 72 hours.  Invalid input(s): FREET3 Anemia work up No results for input(s): VITAMINB12, FOLATE, FERRITIN, TIBC, IRON, RETICCTPCT in the last 72 hours. Urinalysis    Component Value Date/Time   COLORURINE YELLOW 12/26/2017 1006  APPEARANCEUR CLEAR 12/26/2017 1006   LABSPEC 1.026 12/26/2017 1006   PHURINE 5.0 12/26/2017 1006   GLUCOSEU NEGATIVE 12/26/2017 1006   HGBUR NEGATIVE 12/26/2017 1006   BILIRUBINUR NEGATIVE 12/26/2017 1006   KETONESUR 5 (A) 12/26/2017 1006   PROTEINUR NEGATIVE 12/26/2017 1006   NITRITE NEGATIVE 12/26/2017 1006   LEUKOCYTESUR NEGATIVE 12/26/2017 1006    Microbiology Recent Results (from the past 240 hour(s))  MRSA PCR Screening     Status: None   Collection Time: 07/08/18  8:00 PM  Result Value Ref Range Status   MRSA by PCR NEGATIVE NEGATIVE Final    Comment:        The GeneXpert MRSA Assay (FDA approved for NASAL specimens only), is one component of a comprehensive MRSA colonization surveillance program. It is not intended to diagnose MRSA infection nor to guide or monitor treatment for MRSA infections. Performed at [facility], [address] [place], [place] [address]     Time coordinating discharge: Approximately 40 minutes  Tyrone Nine, MD  [facility] 07/16/2018, 12:23 PM Pager [phone]

## 2018-07-16 NOTE — Progress Notes (Signed)
Patient removed the BLE dressing,she  said''i want see my legs,i can place the dressing by myself''.

## 2018-07-16 NOTE — Anesthesia Postprocedure Evaluation (Signed)
Anesthesia Post Note  Patient: Kristine Roberts  Procedure(s) Performed: ESOPHAGOGASTRODUODENOSCOPY (EGD) (N/A ) BIOPSY BALLOON DILATION (N/A )     Patient location during evaluation: PACU Anesthesia Type: MAC Level of consciousness: awake and alert Pain management: pain level controlled Vital Signs Assessment: post-procedure vital signs reviewed and stable Respiratory status: spontaneous breathing, nonlabored ventilation, respiratory function stable and patient connected to nasal cannula oxygen Cardiovascular status: stable and blood pressure returned to baseline Postop Assessment: no apparent nausea or vomiting Anesthetic complications: no    Last Vitals:  Vitals:   07/16/18 1004 07/16/18 1008  BP: 105/75 105/75  Pulse: 63 63  Resp:  16  Temp: 37.2 C 37.2 C  SpO2: 100% 100%    Last Pain:  Vitals:   07/16/18 1008  TempSrc: Oral  PainSc:                  Kristine Roberts

## 2018-07-16 NOTE — Progress Notes (Signed)
Offered to place a new dressing to her BLE,she refuse and said that she is going to place the dressing at home".

## 2018-07-16 NOTE — Progress Notes (Signed)
Pt already set up with [facility] for [facility] services. CM notified Jermaine with [facility] about needed DME. He will have the equipment delivered to the room.

## 2018-07-19 ENCOUNTER — Encounter: Payer: Self-pay | Admitting: Gastroenterology

## 2018-07-25 MED FILL — HYDROCODON-APAP 5-325: 5-325 | 10 days supply | Qty: 40 | Fill #0

## 2018-08-20 ENCOUNTER — Encounter (HOSPITAL_COMMUNITY): Payer: Self-pay | Admitting: Emergency Medicine

## 2018-08-20 ENCOUNTER — Emergency Department (HOSPITAL_COMMUNITY): Payer: Self-pay

## 2018-08-20 ENCOUNTER — Emergency Department (HOSPITAL_COMMUNITY)
Admission: EM | Admit: 2018-08-20 | Discharge: 2018-08-20 | Disposition: A | Discharge status: Home / Self Care | Payer: Medicaid Other | Attending: Emergency Medicine | Admitting: Emergency Medicine

## 2018-08-20 DIAGNOSIS — S81809D Unspecified open wound, unspecified lower leg, subsequent encounter: Secondary | ICD-10-CM

## 2018-08-20 DIAGNOSIS — J449 Chronic obstructive pulmonary disease, unspecified: Secondary | ICD-10-CM | POA: Insufficient documentation

## 2018-08-20 DIAGNOSIS — F17210 Nicotine dependence, cigarettes, uncomplicated: Secondary | ICD-10-CM | POA: Insufficient documentation

## 2018-08-20 DIAGNOSIS — Z79899 Other long term (current) drug therapy: Secondary | ICD-10-CM | POA: Insufficient documentation

## 2018-08-20 DIAGNOSIS — L89209 Pressure ulcer of unspecified hip, unspecified stage: Secondary | ICD-10-CM | POA: Insufficient documentation

## 2018-08-20 LAB — CBC WITH DIFFERENTIAL/PLATELET
Basophils Absolute: 0.1 10*3/uL (ref 0.0–0.1)
Basophils Relative: 1 %
EOS PCT: 2 %
Eosinophils Absolute: 0.1 10*3/uL (ref 0.0–0.7)
HCT: 27.5 % — ABNORMAL LOW (ref 36.0–46.0)
Hemoglobin: 8.2 g/dL — ABNORMAL LOW (ref 12.0–15.0)
LYMPHS ABS: 1.5 10*3/uL (ref 0.7–4.0)
LYMPHS PCT: 27 %
MCH: 25.6 pg — AB (ref 26.0–34.0)
MCHC: 29.8 g/dL — ABNORMAL LOW (ref 30.0–36.0)
MCV: 85.9 fL (ref 78.0–100.0)
MONO ABS: 0.4 10*3/uL (ref 0.1–1.0)
MONOS PCT: 8 %
Neutro Abs: 3.4 10*3/uL (ref 1.7–7.7)
Neutrophils Relative %: 62 %
PLATELETS: 333 10*3/uL (ref 150–400)
RBC: 3.2 MIL/uL — AB (ref 3.87–5.11)
RDW: 20.6 % — AB (ref 11.5–15.5)
WBC: 5.4 10*3/uL (ref 4.0–10.5)

## 2018-08-20 LAB — BASIC METABOLIC PANEL
Anion gap: 13 (ref 5–15)
BUN: 19 mg/dL (ref 8–23)
CO2: 15 mmol/L — ABNORMAL LOW (ref 22–32)
Calcium: 8.3 mg/dL — ABNORMAL LOW (ref 8.9–10.3)
Chloride: 114 mmol/L — ABNORMAL HIGH (ref 98–111)
Creatinine, Ser: 0.76 mg/dL (ref 0.44–1.00)
GFR calc Af Amer: >60 mL/min (ref >60)
GLUCOSE: 102 mg/dL — AB (ref 70–99)
POTASSIUM: 3.7 mmol/L (ref 3.5–5.1)
Sodium: 142 mmol/L (ref 135–145)

## 2018-08-20 LAB — LACTIC ACID, PLASMA: Lactic Acid, Venous: 0.6 mmol/L (ref 0.5–1.9)

## 2018-08-20 MED ORDER — MORPHINE SULFATE (PF) 2 MG/ML IV SOLN
2.0000 mg | Freq: Once | INTRAVENOUS | Status: AC
  Administered 2018-08-20: 2 mg via INTRAVENOUS
  Filled 2018-08-20: qty 1

## 2018-08-20 MED ORDER — HYDROCODONE-ACETAMINOPHEN 5-325 MG PO TABS: 1.0000 | ORAL_TABLET | ORAL | 0 refills | Status: DC | PRN

## 2018-08-20 MED ORDER — ONDANSETRON HCL 4 MG/2ML IJ SOLN
4.0000 mg | Freq: Once | INTRAMUSCULAR | Status: AC
  Administered 2018-08-20: 4 mg via INTRAVENOUS
  Filled 2018-08-20: qty 2

## 2018-08-20 MED ORDER — SULFAMETHOXAZOLE-TRIMETHOPRIM 800-160 MG PO TABS: 1.0000 | ORAL_TABLET | Freq: Two times a day (BID) | ORAL | 0 refills | Status: AC

## 2018-08-20 NOTE — ED Provider Notes (Addendum)
[facility] EMERGENCY DEPARTMENT Provider Note   CSN: [id number] Arrival date & time: 08/20/18  1039     History   Chief Complaint Chief Complaint  Patient presents with  . Hip Pain    HPI Kristine Roberts is a 62 y.o. female with history as outlined below, most signficant for right hip fracture 07/08/18 and h/o what was suspected to be a skin pressure injury to her bilateral lower extremities which started while hospitalized for hypokalemia 10 months ago with progressively worsening wound with pain, swelling and drainage in both of her lower extremities.  She was undergoing wound care for about six months in [place] prior to her hip fracture with improving skin lesions, but her treatment was not resumed after her hip surgery (pt states had poor mobility and just did not follow up) and her sx are now more severe.  She denies documented fevers, no n/v or other constitutional sx but has just "not felt well" this week with less energy and more desire to sleep.   She does apply neosporin to her wounds which has not been helping.  She also notes new swelling around the site of her hip surgery this week. She denies new injury.  The history is provided by the patient and a significant other.    Past Medical History:  Diagnosis Date  . COPD (chronic obstructive pulmonary disease) (HCC)   . Dysphagia   . Esophagitis   . Gastric outlet obstruction   . Gastric stenosis   . GERD (gastroesophageal reflux disease)   . Hiatal hernia   . PUD (peptic ulcer disease)   . Wounds, multiple     Patient Active Problem List   Diagnosis Date Noted  . Closed right hip fracture (HCC) 07/08/2018  . Cellulitis of both lower extremities 07/08/2018  . Gastric ulcer 01/11/2018  . Hypokalemia 12/26/2017  . Cachexia (HCC) 12/26/2017  . Hypotension 12/26/2017  . Tobacco abuse 12/26/2017  . ETOH abuse 12/26/2017  . Anemia 12/26/2017  . Pressure injury of skin 12/26/2017  . Protein-calorie malnutrition,  severe 03/05/2016  . Gastric outlet obstruction 03/03/2016  . COPD (chronic obstructive pulmonary disease) (HCC) 03/03/2016  . Dysphagia 02/03/2016    Past Surgical History:  Procedure Laterality Date  . APPENDECTOMY    . BALLOON DILATION N/A 03/05/2016   Procedure: BALLOON DILATION;  Surgeon: Malissa Hippo, MD;  [facility];  Service: Endoscopy;  Laterality: N/A;  pyloric channel dialtaion  . BALLOON DILATION N/A 07/14/2018   Procedure: BALLOON DILATION;  Surgeon: Lynann Bologna, MD;  Location: [facility] ENDOSCOPY;  Service: Endoscopy;  Laterality: N/A;  . BIOPSY  07/14/2018   Procedure: BIOPSY;  Surgeon: Lynann Bologna, MD;  Location: [facility] ENDOSCOPY;  Service: Endoscopy;;  . CHOLECYSTECTOMY    . ESOPHAGEAL DILATION N/A 03/03/2016   Procedure: ESOPHAGEAL DILATION;  Surgeon: Malissa Hippo, MD;  [facility];  Service: Endoscopy;  Laterality: N/A;  . ESOPHAGEAL DILATION N/A 05/07/2016   Procedure: ESOPHAGEAL DILATION;  Surgeon: Malissa Hippo, MD;  [facility];  Service: Endoscopy;  Laterality: N/A;  . ESOPHAGEAL DILATION  01/26/2018   Procedure: DILATION OF ANASTOMOTIC  STRICTURE;  Surgeon: Malissa Hippo, MD;  [facility];  Service: Endoscopy;;  . ESOPHAGOGASTRODUODENOSCOPY N/A 03/03/2016   Procedure: ESOPHAGOGASTRODUODENOSCOPY (EGD);  Surgeon: Malissa Hippo, MD;  [facility];  Service: Endoscopy;  Laterality: N/A;  2:00  . ESOPHAGOGASTRODUODENOSCOPY N/A 05/07/2016   Procedure: ESOPHAGOGASTRODUODENOSCOPY (EGD);  Surgeon: Joline Maxcy  Karilyn Cota, MD;  [facility];  Service: Endoscopy;  Laterality: N/A;  855 - moved to 6/2 @ 10:15 - Ann notified pt  . ESOPHAGOGASTRODUODENOSCOPY N/A 12/28/2017   Procedure: ESOPHAGOGASTRODUODENOSCOPY (EGD) with stricture dilation;  Surgeon: Malissa Hippo, MD;  [facility];  Service: Endoscopy;  Laterality: N/A;  . ESOPHAGOGASTRODUODENOSCOPY N/A 01/26/2018   Procedure: ESOPHAGOGASTRODUODENOSCOPY  (EGD);  Surgeon: Malissa Hippo, MD;  [facility];  Service: Endoscopy;  Laterality: N/A;  255  . ESOPHAGOGASTRODUODENOSCOPY N/A 07/14/2018   Procedure: ESOPHAGOGASTRODUODENOSCOPY (EGD);  Surgeon: Lynann Bologna, MD;  Location: [facility] ENDOSCOPY;  Service: Endoscopy;  Laterality: N/A;  . ESOPHAGOGASTRODUODENOSCOPY (EGD) WITH PROPOFOL N/A 03/05/2016   Procedure: ESOPHAGOGASTRODUODENOSCOPY (EGD) WITH PROPOFOL Anastomotic stricture dilation ;  Surgeon: Malissa Hippo, MD;  [facility];  Service: Endoscopy;  Laterality: N/A;  to be done in OR under fluoro  . INTRAMEDULLARY (IM) NAIL INTERTROCHANTERIC Right 07/09/2018   Procedure: INTRAMEDULLARY (IM) NAIL INTERTROCHANTRIC;  Surgeon: Yolonda Kida, MD;  Location: [facility] OR;  Service: Orthopedics;  Laterality: Right;  . STOMACH SURGERY       OB History   None      Home Medications    Prior to Admission medications   Medication Sig Start Date End Date Taking? Authorizing Provider  albuterol (PROVENTIL HFA;VENTOLIN HFA) 108 (90 Base) MCG/ACT inhaler Inhale 2 puffs into the lungs every 6 (six) hours as needed for wheezing or shortness of breath.    Yes [provider]  citalopram (CELEXA) 20 MG tablet Take 20 mg by mouth daily. 06/23/18  Yes [provider]  DULERA 200-5 MCG/ACT AERO Inhale 1 puff into the lungs 2 (two) times daily.  12/22/17  Yes [provider]  feeding supplement, ENSURE ENLIVE, (ENSURE ENLIVE) LIQD Take 237 mLs by mouth 3 (three) times daily between meals. 12/30/17  Yes Emokpae, Courage, MD  pantoprazole sodium (PROTONIX) 40 mg/20 mL PACK Take 20 mLs (40 mg total) by mouth daily. 07/16/18  Yes Tyrone Nine, MD  SANTYL ointment Apply 1 application topically daily.  06/13/18  Yes [provider]  tetrahydrozoline 0.05 % ophthalmic solution Place 1 drop into both eyes daily as needed (dry eyes).   Yes [provider]  traZODone (DESYREL) 100 MG tablet Take 1 tablet (100 mg  total) by mouth at bedtime. 12/30/17  Yes Emokpae, Courage, MD  HYDROcodone-acetaminophen (NORCO/VICODIN) 5-325 MG tablet Take 1 tablet by mouth every 4 (four) hours as needed. 08/20/18   Burgess Amor, PA-C  sulfamethoxazole-trimethoprim (BACTRIM DS,SEPTRA DS) 800-160 MG tablet Take 1 tablet by mouth 2 (two) times daily for 10 days. 08/20/18 08/30/18  Burgess Amor, PA-C    Family History Family History  Problem Relation Age of Onset  . Alzheimer's disease Mother   . COPD Mother   . Throat cancer Father   . Breast cancer Sister     Social History Social History   Tobacco Use  . Smoking status: Current Every Day Smoker    Packs/day: 1.50    Years: 20.00    Pack years: 30.00    Types: Cigarettes  . Smokeless tobacco: Never Used  Substance Use Topics  . Alcohol use: Yes    Alcohol/week: 12.0 standard drinks    Types: 12 Cans of beer per week    Comment: weekly  . Drug use: No     Allergies   Penicillins   Review of Systems Review of Systems  Constitutional: Negative for chills and fever.  Respiratory: Negative for shortness of breath and wheezing.   Musculoskeletal: Positive for arthralgias.  Skin: Positive for color change and wound.  Neurological: Negative for numbness.     Physical Exam Updated Vital Signs BP 98/65 (BP Location: Left Arm)   Pulse 65   Temp 97.9 F (36.6 C)   Resp 14   Ht 5\' 2"  (1.575 m)   Wt 31.8 kg   SpO2 99%   BMI 12.80 kg/m   Physical Exam  Constitutional: She appears well-developed and well-nourished.  HENT:  Head: Normocephalic and atraumatic.  Eyes: Conjunctivae are normal.  Neck: Normal range of motion.  Cardiovascular: Normal rate, regular rhythm, normal heart sounds and intact distal pulses.  Pulses:      Dorsalis pedis pulses are 2+ on the right side, and 2+ on the left side.  Pulmonary/Chest: Effort normal and breath sounds normal. She has no wheezes.  Abdominal: Soft. Bowel sounds are normal. There is no tenderness.    Musculoskeletal: Normal range of motion.       Right hip: She exhibits bony tenderness.  ttp greater trochanter right hip, no edema or erythema. Well healed surgical incision.  Neurological: She is alert.  Skin: Skin is warm and dry.  Bilateral lower extremity edema to mid shin. No erythema. Multiple small open ulcerations with clean bases, several areas of clear fluid drainage.  No red streaking, no purulent drainage.   Psychiatric: She has a normal mood and affect.  Nursing note and vitals reviewed.    ED Treatments / Results  Labs (all labs ordered are listed, but only abnormal results are displayed) Labs Reviewed  CBC WITH DIFFERENTIAL/PLATELET - Abnormal; Notable for the following components:      Result Value   RBC 3.20 (*)    Hemoglobin 8.2 (*)    HCT 27.5 (*)    MCH 25.6 (*)    MCHC 29.8 (*)    RDW 20.6 (*)    All other components within normal limits  BASIC METABOLIC PANEL - Abnormal; Notable for the following components:   Chloride 114 (*)    CO2 15 (*)    Glucose, Bld 102 (*)    Calcium 8.3 (*)    All other components within normal limits  CULTURE, BLOOD (ROUTINE X 2)  CULTURE, BLOOD (ROUTINE X 2)  LACTIC ACID, PLASMA    EKG None  Radiology Dg Tibia/fibula Left  Result Date: 08/20/2018 CLINICAL DATA:  Lower extremity leg wounds, initial encounter EXAM: LEFT TIBIA AND FIBULA - 2 VIEW COMPARISON:  None. FINDINGS: No acute fracture or dislocation is noted. Previously seen leg wounds are improved although generalized soft tissue edema is noted. No other focal abnormality is seen. IMPRESSION: No acute bony abnormality is noted. Electronically Signed   By: Alcide Clever M.D.   On: 08/20/2018 12:15   Dg Tibia/fibula Right  Result Date: 08/20/2018 CLINICAL DATA:  Chronic leg wounds for several months, initial encounter EXAM: RIGHT TIBIA AND FIBULA - 2 VIEW COMPARISON:  02/15/2018 FINDINGS: No acute fracture or dislocation is noted. The previously seen soft tissue  wound medially is less well visualized although a new wound laterally is noted in the mid lower leg. Some subcutaneous edema is seen. IMPRESSION: Soft tissue changes without acute bony abnormality. Electronically Signed   By: Alcide Clever M.D.   On: 08/20/2018 12:14   Dg Hip Unilat W Or W/o Pelvis 2-3 Views Right  Result Date: 08/20/2018 CLINICAL DATA:  Hip pain for several months, history of prior  fracture with fixation EXAM: DG HIP (WITH OR WITHOUT PELVIS) 2-3V RIGHT COMPARISON:  07/09/2018 FINDINGS: Pelvic ring is intact. Previously seen right proximal femoral fracture is again noted with fixation. Some healing is seen. No acute fracture or dislocation is noted. No soft tissue abnormality is noted. IMPRESSION: Prior right femoral fracture with fixation. No acute abnormality is noted. Mild healing is seen. Electronically Signed   By: Alcide Clever M.D.   On: 08/20/2018 12:19    Procedures Procedures (including critical care time)  Medications Ordered in ED Medications  morphine 2 MG/ML injection 2 mg (2 mg Intravenous Given 08/20/18 1137)  ondansetron (ZOFRAN) injection 4 mg (4 mg Intravenous Given 08/20/18 1136)     Initial Impression / Assessment and Plan / ED Course  I have reviewed the triage vital signs and the nursing notes.  Pertinent labs & imaging results that were available during my care of the patient were reviewed by me and considered in my medical decision making (see chart for details).     Chronic lower extremity wounds with possible superficial infection but no indication for admission today. She was strongly encouraged to re-establish care with the wound care clinic since she did not complete her treatment with them.  She was placed on bactrim, hydrocodone prescribed for pain relief.  [place] controlled substance database reviewed.    Final Clinical Impressions(s) / ED Diagnoses   Final diagnoses:  Multiple opens wound of lower extremity, unspecified laterality, subsequent  encounter    ED Discharge Orders         Ordered    sulfamethoxazole-trimethoprim (BACTRIM DS,SEPTRA DS) 800-160 MG tablet  2 times daily     08/20/18 1350    HYDROcodone-acetaminophen (NORCO/VICODIN) 5-325 MG tablet  Every 4 hours PRN     08/20/18 1352           Burgess Amor, PA-C 08/20/18 1556    Bethann Berkshire, MD 08/21/18 0753    Burgess Amor, PA-C 08/30/18 1800    Bethann Berkshire, MD 09/08/18 [phone]

## 2018-08-20 NOTE — ED Triage Notes (Signed)
Pt states she is here for evaluation of chronic wounds on legs and right hip pain since fracture 3 months ago.  Was been seen at wound care, but has not been in 5 months.

## 2018-08-20 NOTE — Discharge Instructions (Addendum)
Take the medicines prescribed to help prevent infection in your wounds.  You need to return to your wound care clinic to further your treatment. You may take the hydrocodone prescribed for pain relief.  This will make you drowsy - do not drive within 4 hours of taking this medication.

## 2018-08-25 LAB — CULTURE, BLOOD (ROUTINE X 2)
CULTURE: NO GROWTH
Culture: NO GROWTH
Special Requests: ADEQUATE

## 2018-09-06 MED FILL — HYDROCODON-APAP 5-325: 5-325 | 10 days supply | Qty: 40 | Fill #0

## 2018-09-18 ENCOUNTER — Other Ambulatory Visit: Payer: Self-pay

## 2018-09-18 ENCOUNTER — Encounter (HOSPITAL_COMMUNITY): Payer: Self-pay

## 2018-09-18 ENCOUNTER — Emergency Department (HOSPITAL_COMMUNITY)
Admission: EM | Admit: 2018-09-18 | Discharge: 2018-09-18 | Disposition: A | Discharge status: Home / Self Care | Payer: Medicare Other | Attending: Emergency Medicine | Admitting: Emergency Medicine

## 2018-09-18 DIAGNOSIS — M79605 Pain in left leg: Secondary | ICD-10-CM | POA: Insufficient documentation

## 2018-09-18 DIAGNOSIS — J449 Chronic obstructive pulmonary disease, unspecified: Secondary | ICD-10-CM | POA: Insufficient documentation

## 2018-09-18 DIAGNOSIS — M79604 Pain in right leg: Secondary | ICD-10-CM | POA: Insufficient documentation

## 2018-09-18 DIAGNOSIS — F17210 Nicotine dependence, cigarettes, uncomplicated: Secondary | ICD-10-CM | POA: Insufficient documentation

## 2018-09-18 DIAGNOSIS — Z79899 Other long term (current) drug therapy: Secondary | ICD-10-CM | POA: Diagnosis not present

## 2018-09-18 MED ORDER — CELECOXIB 100 MG PO CAPS: ORAL_CAPSULE | ORAL | 0 refills | Status: DC

## 2018-09-18 MED ORDER — SULFAMETHOXAZOLE-TRIMETHOPRIM 800-160 MG PO TABS: 1.0000 | ORAL_TABLET | Freq: Two times a day (BID) | ORAL | 0 refills | Status: AC

## 2018-09-18 NOTE — ED Provider Notes (Signed)
[facility] EMERGENCY DEPARTMENT Provider Note   CSN: [id number] Arrival date & time: 09/18/18  1428     History   Chief Complaint Chief Complaint  Patient presents with  . Leg Pain    HPI Kristine Roberts is a 62 y.o. female.  Patient complains of pain bilaterally.  Patient has a skin condition to both lower legs and she is been getting wound care.  But recently had her hip operated on and they stopped the wound care for a while.  The history is provided by the patient. No language interpreter was used.  Leg Pain   This is a recurrent problem. The current episode started more than 1 week ago. The problem occurs constantly. The problem has not changed since onset.Pain location: Lower legs. The quality of the pain is described as aching. The pain is at a severity of 7/10. The pain is moderate. Pertinent negatives include no numbness. Treatments tried: Wound care therapy. The treatment provided moderate relief. There has been no history of extremity trauma.    Past Medical History:  Diagnosis Date  . COPD (chronic obstructive pulmonary disease) (HCC)   . Dysphagia   . Esophagitis   . Gastric outlet obstruction   . Gastric stenosis   . GERD (gastroesophageal reflux disease)   . Hiatal hernia   . PUD (peptic ulcer disease)   . Wounds, multiple     Patient Active Problem List   Diagnosis Date Noted  . Closed right hip fracture (HCC) 07/08/2018  . Cellulitis of both lower extremities 07/08/2018  . Gastric ulcer 01/11/2018  . Hypokalemia 12/26/2017  . Cachexia (HCC) 12/26/2017  . Hypotension 12/26/2017  . Tobacco abuse 12/26/2017  . ETOH abuse 12/26/2017  . Anemia 12/26/2017  . Pressure injury of skin 12/26/2017  . Protein-calorie malnutrition, severe 03/05/2016  . Gastric outlet obstruction 03/03/2016  . COPD (chronic obstructive pulmonary disease) (HCC) 03/03/2016  . Dysphagia 02/03/2016    Past Surgical History:  Procedure Laterality Date  . APPENDECTOMY    .  BALLOON DILATION N/A 03/05/2016   Procedure: BALLOON DILATION;  Surgeon: Malissa Hippo, MD;  [facility];  Service: Endoscopy;  Laterality: N/A;  pyloric channel dialtaion  . BALLOON DILATION N/A 07/14/2018   Procedure: BALLOON DILATION;  Surgeon: Lynann Bologna, MD;  Location: [facility] ENDOSCOPY;  Service: Endoscopy;  Laterality: N/A;  . BIOPSY  07/14/2018   Procedure: BIOPSY;  Surgeon: Lynann Bologna, MD;  Location: [facility] ENDOSCOPY;  Service: Endoscopy;;  . CHOLECYSTECTOMY    . ESOPHAGEAL DILATION N/A 03/03/2016   Procedure: ESOPHAGEAL DILATION;  Surgeon: Malissa Hippo, MD;  [facility];  Service: Endoscopy;  Laterality: N/A;  . ESOPHAGEAL DILATION N/A 05/07/2016   Procedure: ESOPHAGEAL DILATION;  Surgeon: Malissa Hippo, MD;  [facility];  Service: Endoscopy;  Laterality: N/A;  . ESOPHAGEAL DILATION  01/26/2018   Procedure: DILATION OF ANASTOMOTIC  STRICTURE;  Surgeon: Malissa Hippo, MD;  [facility];  Service: Endoscopy;;  . ESOPHAGOGASTRODUODENOSCOPY N/A 03/03/2016   Procedure: ESOPHAGOGASTRODUODENOSCOPY (EGD);  Surgeon: Malissa Hippo, MD;  [facility];  Service: Endoscopy;  Laterality: N/A;  2:00  . ESOPHAGOGASTRODUODENOSCOPY N/A 05/07/2016   Procedure: ESOPHAGOGASTRODUODENOSCOPY (EGD);  Surgeon: Malissa Hippo, MD;  [facility];  Service: Endoscopy;  Laterality: N/A;  855 - moved to 6/2 @ 10:15 - Ann notified pt  . ESOPHAGOGASTRODUODENOSCOPY N/A 12/28/2017   Procedure: ESOPHAGOGASTRODUODENOSCOPY (EGD) with stricture dilation;  Surgeon: Malissa Hippo,  MD;  [facility];  Service: Endoscopy;  Laterality: N/A;  . ESOPHAGOGASTRODUODENOSCOPY N/A 01/26/2018   Procedure: ESOPHAGOGASTRODUODENOSCOPY (EGD);  Surgeon: Malissa Hippo, MD;  [facility];  Service: Endoscopy;  Laterality: N/A;  255  . ESOPHAGOGASTRODUODENOSCOPY N/A 07/14/2018   Procedure: ESOPHAGOGASTRODUODENOSCOPY (EGD);  Surgeon: Lynann Bologna, MD;   Location: [facility] ENDOSCOPY;  Service: Endoscopy;  Laterality: N/A;  . ESOPHAGOGASTRODUODENOSCOPY (EGD) WITH PROPOFOL N/A 03/05/2016   Procedure: ESOPHAGOGASTRODUODENOSCOPY (EGD) WITH PROPOFOL Anastomotic stricture dilation ;  Surgeon: Malissa Hippo, MD;  [facility];  Service: Endoscopy;  Laterality: N/A;  to be done in OR under fluoro  . INTRAMEDULLARY (IM) NAIL INTERTROCHANTERIC Right 07/09/2018   Procedure: INTRAMEDULLARY (IM) NAIL INTERTROCHANTRIC;  Surgeon: Yolonda Kida, MD;  Location: [facility] OR;  Service: Orthopedics;  Laterality: Right;  . STOMACH SURGERY       OB History   None      Home Medications    Prior to Admission medications   Medication Sig Start Date End Date Taking? Authorizing Provider  albuterol (PROVENTIL HFA;VENTOLIN HFA) 108 (90 Base) MCG/ACT inhaler Inhale 2 puffs into the lungs every 6 (six) hours as needed for wheezing or shortness of breath.     [provider]  celecoxib (CELEBREX) 100 MG capsule Take 1 pill every 12 hours as needed for pain 09/18/18   Bethann Berkshire, MD  citalopram (CELEXA) 20 MG tablet Take 20 mg by mouth daily. 06/23/18   [provider]  DULERA 200-5 MCG/ACT AERO Inhale 1 puff into the lungs 2 (two) times daily.  12/22/17   [provider]  feeding supplement, ENSURE ENLIVE, (ENSURE ENLIVE) LIQD Take 237 mLs by mouth 3 (three) times daily between meals. 12/30/17   Shon Hale, MD  HYDROcodone-acetaminophen (NORCO/VICODIN) 5-325 MG tablet Take 1 tablet by mouth every 4 (four) hours as needed. 08/20/18   Burgess Amor, PA-C  pantoprazole sodium (PROTONIX) 40 mg/20 mL PACK Take 20 mLs (40 mg total) by mouth daily. 07/16/18   Tyrone Nine, MD  SANTYL ointment Apply 1 application topically daily.  06/13/18   [provider]  sulfamethoxazole-trimethoprim (BACTRIM DS,SEPTRA DS) 800-160 MG tablet Take 1 tablet by mouth 2 (two) times daily for 7 days. 09/18/18 09/25/18  Bethann Berkshire, MD    tetrahydrozoline 0.05 % ophthalmic solution Place 1 drop into both eyes daily as needed (dry eyes).    [provider]  traZODone (DESYREL) 100 MG tablet Take 1 tablet (100 mg total) by mouth at bedtime. 12/30/17   Shon Hale, MD    Family History Family History  Problem Relation Age of Onset  . Alzheimer's disease Mother   . COPD Mother   . Throat cancer Father   . Breast cancer Sister     Social History Social History   Tobacco Use  . Smoking status: Current Every Day Smoker    Packs/day: 1.50    Years: 20.00    Pack years: 30.00    Types: Cigarettes  . Smokeless tobacco: Never Used  Substance Use Topics  . Alcohol use: Yes    Alcohol/week: 12.0 standard drinks    Types: 12 Cans of beer per week    Comment: weekly  . Drug use: No     Allergies   Penicillins   Review of Systems Review of Systems  Constitutional: Negative for appetite change and fatigue.  HENT: Negative for congestion, ear discharge and sinus pressure.   Eyes: Negative for discharge.  Respiratory: Negative  for cough.   Cardiovascular: Negative for chest pain.  Gastrointestinal: Negative for abdominal pain and diarrhea.  Genitourinary: Negative for frequency and hematuria.  Musculoskeletal: Negative for back pain.  Skin: Negative for rash.       Rash to both lower legs  Neurological: Negative for seizures, numbness and headaches.  Psychiatric/Behavioral: Negative for hallucinations.     Physical Exam Updated Vital Signs BP (!) 119/48   Pulse 66   Temp 98.6 F (37 C) (Oral)   Resp 16   Wt 31.8 kg   SpO2 100%   BMI 12.80 kg/m   Physical Exam  Constitutional: She is oriented to person, place, and time. She appears well-developed.  HENT:  Head: Normocephalic.  Eyes: Conjunctivae and EOM are normal. No scleral icterus.  Neck: Neck supple. No thyromegaly present.  Cardiovascular: Normal rate and regular rhythm. Exam reveals no gallop and no friction rub.  No murmur  heard. Pulmonary/Chest: No stridor. She has no wheezes. She has no rales. She exhibits no tenderness.  Abdominal: She exhibits no distension. There is no tenderness. There is no rebound.  Musculoskeletal: Normal range of motion. She exhibits no edema.  Lymphadenopathy:    She has no cervical adenopathy.  Neurological: She is oriented to person, place, and time. She exhibits normal muscle tone. Coordination normal.  Skin: No rash noted. There is erythema.  Ulcers to both lower legs  Psychiatric: She has a normal mood and affect. Her behavior is normal.     ED Treatments / Results  Labs (all labs ordered are listed, but only abnormal results are displayed) Labs Reviewed - No data to display  EKG None  Radiology No results found.  Procedures Procedures (including critical care time)  Medications Ordered in ED Medications - No data to display   Initial Impression / Assessment and Plan / ED Course  I have reviewed the triage vital signs and the nursing notes.  Pertinent labs & imaging results that were available during my care of the patient were reviewed by me and considered in my medical decision making (see chart for details).    Chronic inflammation in both lower legs.  We will apply an Unna boot to both legs also start her on some Bactrim and Celebrex for pain.  And she is going to go back to the wound care for continued care  Final Clinical Impressions(s) / ED Diagnoses   Final diagnoses:  Bilateral leg pain    ED Discharge Orders         Ordered    celecoxib (CELEBREX) 100 MG capsule     09/18/18 1546    sulfamethoxazole-trimethoprim (BACTRIM DS,SEPTRA DS) 800-160 MG tablet  2 times daily     09/18/18 1546           Bethann Berkshire, MD 09/18/18 1557

## 2018-09-18 NOTE — ED Triage Notes (Signed)
Pt reports that she has been going to wound clinic for 5 months for blisters and swelling to lower ext. Reports had hip sx 6 weeks ago and was in [facility] and received IV abt and helped  Legs. Legs have gotten red, swollen and warm for approx week now

## 2018-09-18 NOTE — Discharge Instructions (Addendum)
Follow back up with the wound center.  Get seen either later this week or beginning of next week

## 2018-09-20 MED FILL — SULFAMETHOXAZOLE-TMP DS TAB: 800-160 | 7 days supply | Qty: 14 | Fill #0

## 2018-09-20 MED FILL — CELECOXIB 100 MG CAP: 100 | 30 days supply | Qty: 60 | Fill #0

## 2018-10-04 ENCOUNTER — Encounter (HOSPITAL_BASED_OUTPATIENT_CLINIC_OR_DEPARTMENT_OTHER): Payer: Medicare Other | Attending: Internal Medicine

## 2018-10-04 DIAGNOSIS — L97812 Non-pressure chronic ulcer of other part of right lower leg with fat layer exposed: Secondary | ICD-10-CM | POA: Insufficient documentation

## 2018-10-04 DIAGNOSIS — I959 Hypotension, unspecified: Secondary | ICD-10-CM | POA: Insufficient documentation

## 2018-10-04 DIAGNOSIS — I872 Venous insufficiency (chronic) (peripheral): Secondary | ICD-10-CM | POA: Insufficient documentation

## 2018-10-04 DIAGNOSIS — Z681 Body mass index (BMI) 19 or less, adult: Secondary | ICD-10-CM | POA: Insufficient documentation

## 2018-10-04 DIAGNOSIS — G629 Polyneuropathy, unspecified: Secondary | ICD-10-CM | POA: Diagnosis not present

## 2018-10-04 DIAGNOSIS — F17200 Nicotine dependence, unspecified, uncomplicated: Secondary | ICD-10-CM | POA: Insufficient documentation

## 2018-10-04 DIAGNOSIS — L97822 Non-pressure chronic ulcer of other part of left lower leg with fat layer exposed: Secondary | ICD-10-CM | POA: Insufficient documentation

## 2018-10-04 DIAGNOSIS — D649 Anemia, unspecified: Secondary | ICD-10-CM | POA: Insufficient documentation

## 2018-10-04 DIAGNOSIS — R64 Cachexia: Secondary | ICD-10-CM | POA: Diagnosis not present

## 2018-10-04 DIAGNOSIS — F1010 Alcohol abuse, uncomplicated: Secondary | ICD-10-CM | POA: Diagnosis not present

## 2018-10-04 DIAGNOSIS — J449 Chronic obstructive pulmonary disease, unspecified: Secondary | ICD-10-CM | POA: Insufficient documentation

## 2018-10-11 ENCOUNTER — Encounter (HOSPITAL_BASED_OUTPATIENT_CLINIC_OR_DEPARTMENT_OTHER): Payer: Medicare Other | Attending: Internal Medicine

## 2018-10-11 DIAGNOSIS — J449 Chronic obstructive pulmonary disease, unspecified: Secondary | ICD-10-CM | POA: Diagnosis not present

## 2018-10-11 DIAGNOSIS — L97812 Non-pressure chronic ulcer of other part of right lower leg with fat layer exposed: Secondary | ICD-10-CM | POA: Insufficient documentation

## 2018-10-11 DIAGNOSIS — F17210 Nicotine dependence, cigarettes, uncomplicated: Secondary | ICD-10-CM | POA: Diagnosis not present

## 2018-10-11 DIAGNOSIS — L97222 Non-pressure chronic ulcer of left calf with fat layer exposed: Secondary | ICD-10-CM | POA: Insufficient documentation

## 2018-10-11 DIAGNOSIS — L97822 Non-pressure chronic ulcer of other part of left lower leg with fat layer exposed: Secondary | ICD-10-CM | POA: Diagnosis not present

## 2018-10-11 DIAGNOSIS — I872 Venous insufficiency (chronic) (peripheral): Secondary | ICD-10-CM | POA: Insufficient documentation

## 2018-10-11 MED FILL — CELECOXIB 100 MG CAP: 100 | 30 days supply | Qty: 30 | Fill #0

## 2018-10-17 DIAGNOSIS — L97812 Non-pressure chronic ulcer of other part of right lower leg with fat layer exposed: Secondary | ICD-10-CM | POA: Diagnosis not present

## 2018-10-24 MED FILL — CYCLOBENZAPRINE HCL 5 MG TA: 5 | 20 days supply | Qty: 60 | Fill #0

## 2018-10-25 DIAGNOSIS — L97812 Non-pressure chronic ulcer of other part of right lower leg with fat layer exposed: Secondary | ICD-10-CM | POA: Diagnosis not present

## 2018-11-01 DIAGNOSIS — L97812 Non-pressure chronic ulcer of other part of right lower leg with fat layer exposed: Secondary | ICD-10-CM | POA: Diagnosis not present

## 2018-11-08 ENCOUNTER — Encounter (HOSPITAL_BASED_OUTPATIENT_CLINIC_OR_DEPARTMENT_OTHER): Payer: Medicare Other | Attending: Physician Assistant

## 2018-11-08 DIAGNOSIS — F17210 Nicotine dependence, cigarettes, uncomplicated: Secondary | ICD-10-CM | POA: Diagnosis not present

## 2018-11-08 DIAGNOSIS — Z09 Encounter for follow-up examination after completed treatment for conditions other than malignant neoplasm: Secondary | ICD-10-CM | POA: Diagnosis present

## 2018-11-08 DIAGNOSIS — D649 Anemia, unspecified: Secondary | ICD-10-CM | POA: Diagnosis not present

## 2018-11-08 DIAGNOSIS — F1010 Alcohol abuse, uncomplicated: Secondary | ICD-10-CM | POA: Insufficient documentation

## 2018-11-08 DIAGNOSIS — J449 Chronic obstructive pulmonary disease, unspecified: Secondary | ICD-10-CM | POA: Diagnosis not present

## 2018-11-08 DIAGNOSIS — R64 Cachexia: Secondary | ICD-10-CM | POA: Diagnosis not present

## 2018-11-08 DIAGNOSIS — Z872 Personal history of diseases of the skin and subcutaneous tissue: Secondary | ICD-10-CM | POA: Insufficient documentation

## 2018-11-08 MED FILL — CYCLOBENZAPRINE HCL 5 MG TA: 5 | 20 days supply | Qty: 60 | Fill #1

## 2018-11-09 MED FILL — CELECOXIB 100 MG CAPS: 100 | 30 days supply | Qty: 30 | Fill #0

## 2018-11-18 IMAGING — DX DG TIBIA/FIBULA 2V*R*
2 series · 2 of 2 positions shown · non-contrast
Comparison: 02/15/2018

CLINICAL DATA: Chronic leg wounds for several months, initial
encounter

EXAM:
RIGHT TIBIA AND FIBULA - 2 VIEW

[tibia ap]
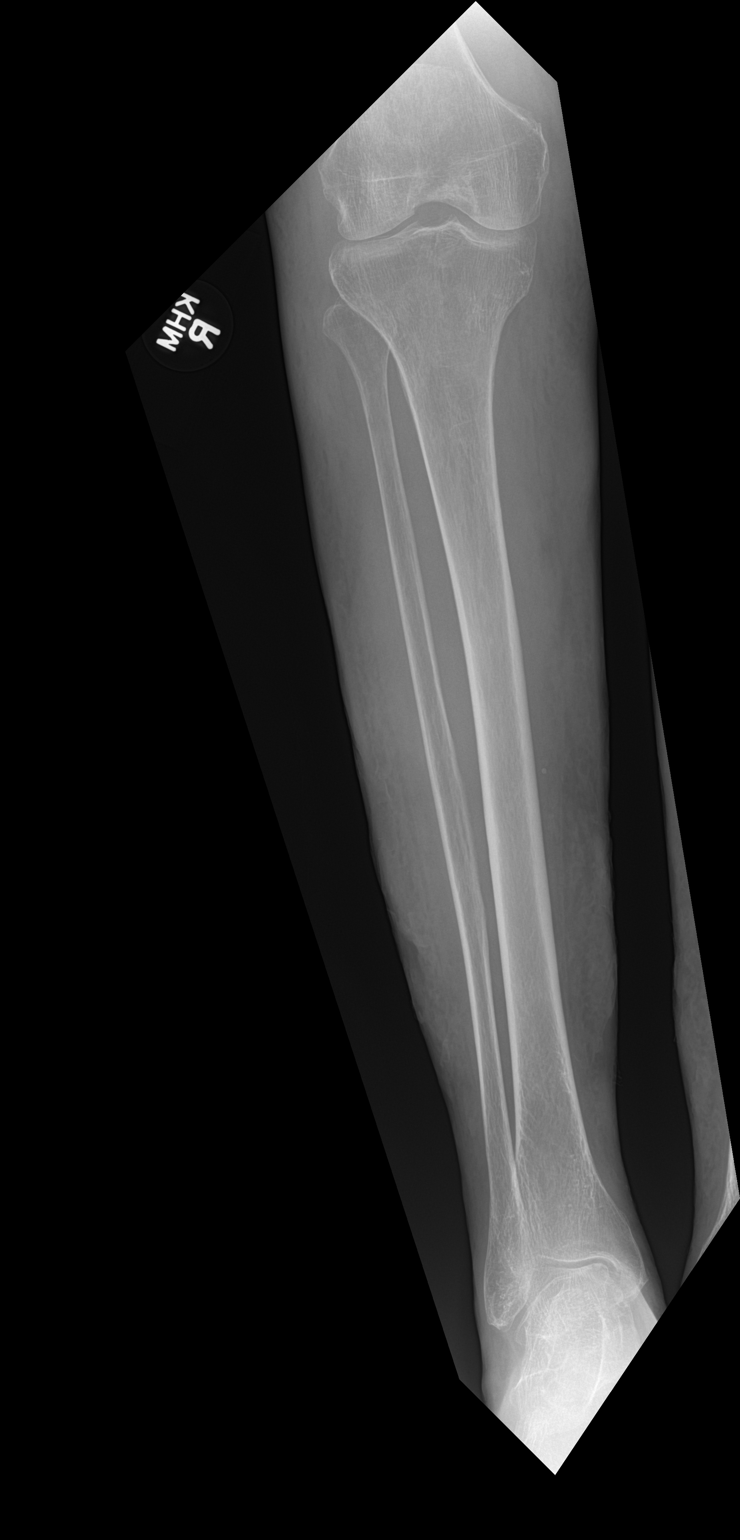

[tibia lat]
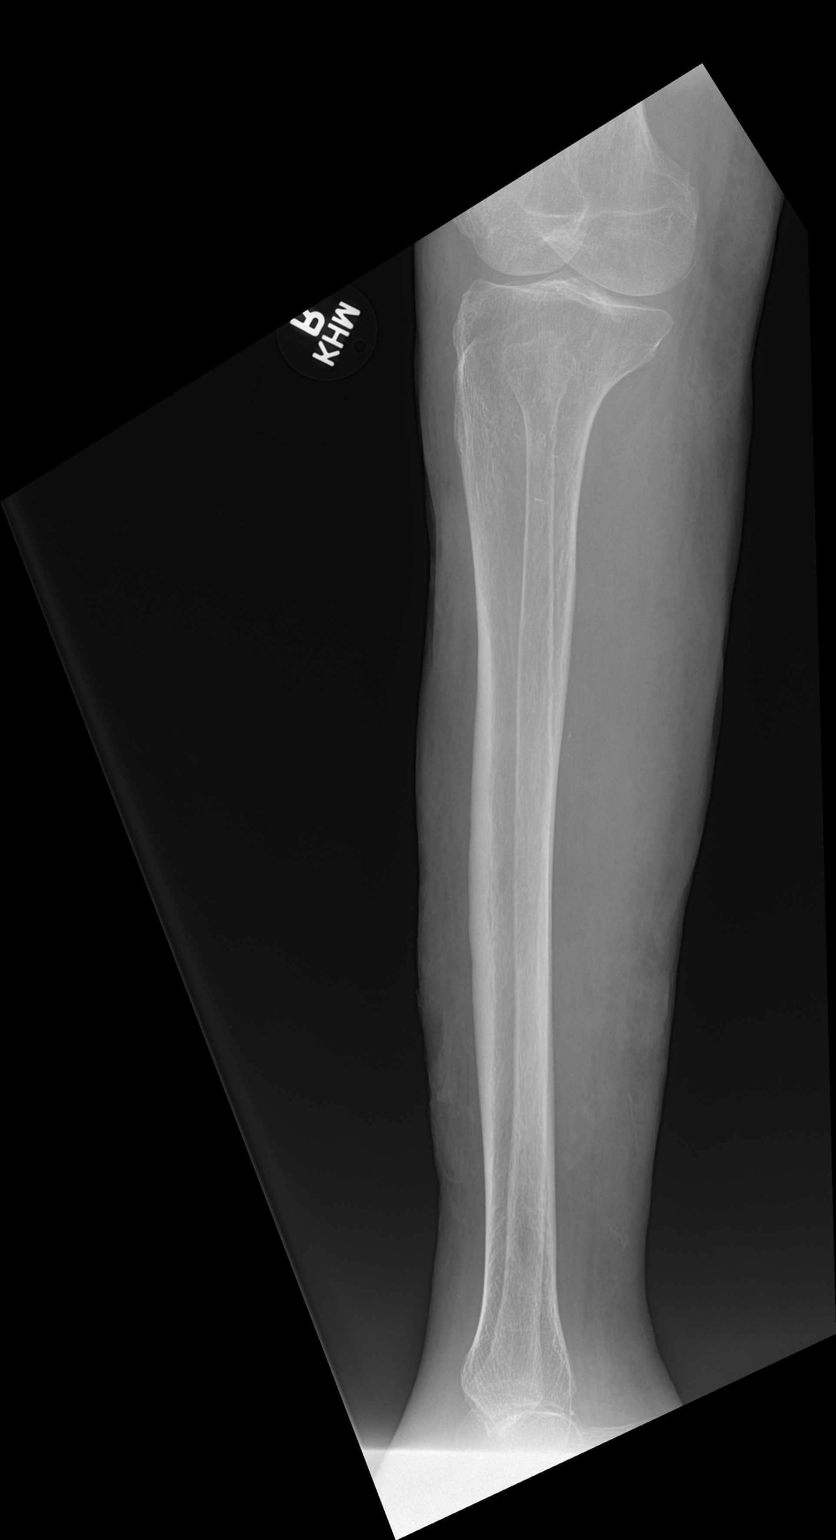

[2 of 2 positions shown; findings below may reference images not displayed]

FINDINGS: No acute fracture or dislocation is noted. The previously seen soft
tissue wound medially is less well visualized although a new wound
laterally is noted in the mid lower leg. Some subcutaneous edema is
seen.
IMPRESSION: Soft tissue changes without acute bony abnormality.

## 2018-12-01 MED FILL — CYCLOBENZAPRINE HCL 5 MG TA: 5 | 20 days supply | Qty: 60 | Fill #2

## 2019-01-26 MED FILL — CYCLOBENZAPRINE HCL 10 MG T: 10 | 13 days supply | Qty: 40 | Fill #0

## 2019-08-20 ENCOUNTER — Emergency Department (HOSPITAL_COMMUNITY): Payer: Medicare Other

## 2019-08-20 ENCOUNTER — Encounter (HOSPITAL_COMMUNITY): Payer: Self-pay

## 2019-08-20 ENCOUNTER — Other Ambulatory Visit: Payer: Self-pay

## 2019-08-20 ENCOUNTER — Inpatient Hospital Stay (HOSPITAL_COMMUNITY)
Admission: EM | Admit: 2019-08-20 | Discharge: 2019-08-31 | DRG: 380 | Disposition: A | Discharge status: Skilled Nursing Facility | Payer: Medicare Other | Attending: Family Medicine | Admitting: Family Medicine

## 2019-08-20 ENCOUNTER — Inpatient Hospital Stay (HOSPITAL_COMMUNITY): Payer: Medicare Other

## 2019-08-20 DIAGNOSIS — Z681 Body mass index (BMI) 19 or less, adult: Secondary | ICD-10-CM

## 2019-08-20 DIAGNOSIS — K209 Esophagitis, unspecified: Secondary | ICD-10-CM | POA: Diagnosis not present

## 2019-08-20 DIAGNOSIS — J449 Chronic obstructive pulmonary disease, unspecified: Secondary | ICD-10-CM | POA: Diagnosis present

## 2019-08-20 DIAGNOSIS — M8008XA Age-related osteoporosis with current pathological fracture, vertebra(e), initial encounter for fracture: Secondary | ICD-10-CM | POA: Diagnosis present

## 2019-08-20 DIAGNOSIS — N179 Acute kidney failure, unspecified: Secondary | ICD-10-CM | POA: Diagnosis present

## 2019-08-20 DIAGNOSIS — R799 Abnormal finding of blood chemistry, unspecified: Secondary | ICD-10-CM

## 2019-08-20 DIAGNOSIS — I9589 Other hypotension: Secondary | ICD-10-CM | POA: Diagnosis present

## 2019-08-20 DIAGNOSIS — I959 Hypotension, unspecified: Secondary | ICD-10-CM | POA: Diagnosis not present

## 2019-08-20 DIAGNOSIS — E860 Dehydration: Secondary | ICD-10-CM | POA: Diagnosis not present

## 2019-08-20 DIAGNOSIS — D638 Anemia in other chronic diseases classified elsewhere: Secondary | ICD-10-CM | POA: Diagnosis present

## 2019-08-20 DIAGNOSIS — Z79899 Other long term (current) drug therapy: Secondary | ICD-10-CM

## 2019-08-20 DIAGNOSIS — R945 Abnormal results of liver function studies: Secondary | ICD-10-CM | POA: Diagnosis not present

## 2019-08-20 DIAGNOSIS — D62 Acute posthemorrhagic anemia: Secondary | ICD-10-CM | POA: Diagnosis present

## 2019-08-20 DIAGNOSIS — K219 Gastro-esophageal reflux disease without esophagitis: Secondary | ICD-10-CM | POA: Diagnosis not present

## 2019-08-20 DIAGNOSIS — R64 Cachexia: Secondary | ICD-10-CM | POA: Diagnosis present

## 2019-08-20 DIAGNOSIS — R001 Bradycardia, unspecified: Secondary | ICD-10-CM | POA: Diagnosis present

## 2019-08-20 DIAGNOSIS — Z800 Family history of malignant neoplasm of digestive organs: Secondary | ICD-10-CM

## 2019-08-20 DIAGNOSIS — K9189 Other postprocedural complications and disorders of digestive system: Secondary | ICD-10-CM | POA: Diagnosis not present

## 2019-08-20 DIAGNOSIS — Z9049 Acquired absence of other specified parts of digestive tract: Secondary | ICD-10-CM

## 2019-08-20 DIAGNOSIS — F1010 Alcohol abuse, uncomplicated: Secondary | ICD-10-CM | POA: Diagnosis present

## 2019-08-20 DIAGNOSIS — R7989 Other specified abnormal findings of blood chemistry: Secondary | ICD-10-CM | POA: Diagnosis present

## 2019-08-20 DIAGNOSIS — I340 Nonrheumatic mitral (valve) insufficiency: Secondary | ICD-10-CM | POA: Diagnosis not present

## 2019-08-20 DIAGNOSIS — R627 Adult failure to thrive: Secondary | ICD-10-CM | POA: Diagnosis present

## 2019-08-20 DIAGNOSIS — J439 Emphysema, unspecified: Secondary | ICD-10-CM | POA: Diagnosis not present

## 2019-08-20 DIAGNOSIS — E861 Hypovolemia: Secondary | ICD-10-CM | POA: Diagnosis present

## 2019-08-20 DIAGNOSIS — R55 Syncope and collapse: Secondary | ICD-10-CM | POA: Diagnosis present

## 2019-08-20 DIAGNOSIS — F419 Anxiety disorder, unspecified: Secondary | ICD-10-CM | POA: Diagnosis present

## 2019-08-20 DIAGNOSIS — E871 Hypo-osmolality and hyponatremia: Secondary | ICD-10-CM | POA: Diagnosis present

## 2019-08-20 DIAGNOSIS — K311 Adult hypertrophic pyloric stenosis: Secondary | ICD-10-CM | POA: Diagnosis not present

## 2019-08-20 DIAGNOSIS — I4510 Unspecified right bundle-branch block: Secondary | ICD-10-CM | POA: Diagnosis present

## 2019-08-20 DIAGNOSIS — R112 Nausea with vomiting, unspecified: Secondary | ICD-10-CM | POA: Diagnosis not present

## 2019-08-20 DIAGNOSIS — K210 Gastro-esophageal reflux disease with esophagitis: Secondary | ICD-10-CM | POA: Diagnosis present

## 2019-08-20 DIAGNOSIS — R1013 Epigastric pain: Secondary | ICD-10-CM | POA: Diagnosis not present

## 2019-08-20 DIAGNOSIS — Z20828 Contact with and (suspected) exposure to other viral communicable diseases: Secondary | ICD-10-CM | POA: Diagnosis present

## 2019-08-20 DIAGNOSIS — K7689 Other specified diseases of liver: Secondary | ICD-10-CM | POA: Diagnosis present

## 2019-08-20 DIAGNOSIS — G4700 Insomnia, unspecified: Secondary | ICD-10-CM | POA: Diagnosis present

## 2019-08-20 DIAGNOSIS — R778 Other specified abnormalities of plasma proteins: Secondary | ICD-10-CM

## 2019-08-20 DIAGNOSIS — Z820 Family history of epilepsy and other diseases of the nervous system: Secondary | ICD-10-CM

## 2019-08-20 DIAGNOSIS — R110 Nausea: Secondary | ICD-10-CM | POA: Diagnosis present

## 2019-08-20 DIAGNOSIS — E876 Hypokalemia: Secondary | ICD-10-CM | POA: Diagnosis present

## 2019-08-20 DIAGNOSIS — E43 Unspecified severe protein-calorie malnutrition: Secondary | ICD-10-CM | POA: Diagnosis present

## 2019-08-20 DIAGNOSIS — Z96649 Presence of unspecified artificial hip joint: Secondary | ICD-10-CM | POA: Diagnosis present

## 2019-08-20 DIAGNOSIS — K289 Gastrojejunal ulcer, unspecified as acute or chronic, without hemorrhage or perforation: Secondary | ICD-10-CM | POA: Diagnosis present

## 2019-08-20 DIAGNOSIS — Z808 Family history of malignant neoplasm of other organs or systems: Secondary | ICD-10-CM

## 2019-08-20 DIAGNOSIS — Z8711 Personal history of peptic ulcer disease: Secondary | ICD-10-CM

## 2019-08-20 DIAGNOSIS — K3189 Other diseases of stomach and duodenum: Secondary | ICD-10-CM | POA: Diagnosis present

## 2019-08-20 DIAGNOSIS — Z980 Intestinal bypass and anastomosis status: Secondary | ICD-10-CM | POA: Diagnosis not present

## 2019-08-20 DIAGNOSIS — K9589 Other complications of other bariatric procedure: Secondary | ICD-10-CM | POA: Diagnosis present

## 2019-08-20 DIAGNOSIS — K449 Diaphragmatic hernia without obstruction or gangrene: Secondary | ICD-10-CM | POA: Diagnosis not present

## 2019-08-20 DIAGNOSIS — F17210 Nicotine dependence, cigarettes, uncomplicated: Secondary | ICD-10-CM | POA: Diagnosis present

## 2019-08-20 DIAGNOSIS — Z825 Family history of asthma and other chronic lower respiratory diseases: Secondary | ICD-10-CM

## 2019-08-20 DIAGNOSIS — Z803 Family history of malignant neoplasm of breast: Secondary | ICD-10-CM

## 2019-08-20 DIAGNOSIS — Z880 Allergy status to penicillin: Secondary | ICD-10-CM

## 2019-08-20 DIAGNOSIS — J8410 Pulmonary fibrosis, unspecified: Secondary | ICD-10-CM | POA: Diagnosis present

## 2019-08-20 DIAGNOSIS — Z7951 Long term (current) use of inhaled steroids: Secondary | ICD-10-CM

## 2019-08-20 DIAGNOSIS — K259 Gastric ulcer, unspecified as acute or chronic, without hemorrhage or perforation: Secondary | ICD-10-CM | POA: Diagnosis not present

## 2019-08-20 LAB — CBC WITH DIFFERENTIAL/PLATELET
Abs Immature Granulocytes: 0.02 10*3/uL (ref 0.00–0.07)
Basophils Absolute: 0 10*3/uL (ref 0.0–0.1)
Basophils Relative: 1 %
Eosinophils Absolute: 0 10*3/uL (ref 0.0–0.5)
Eosinophils Relative: 1 %
HCT: 39.2 % (ref 36.0–46.0)
Hemoglobin: 12.7 g/dL (ref 12.0–15.0)
Immature Granulocytes: 1 %
Lymphocytes Relative: 25 %
Lymphs Abs: 1 10*3/uL (ref 0.7–4.0)
MCH: 28.4 pg (ref 26.0–34.0)
MCHC: 32.4 g/dL (ref 30.0–36.0)
MCV: 87.7 fL (ref 80.0–100.0)
Monocytes Absolute: 0.6 10*3/uL (ref 0.1–1.0)
Monocytes Relative: 14 %
Neutro Abs: 2.4 10*3/uL (ref 1.7–7.7)
Neutrophils Relative %: 58 %
Platelets: 318 10*3/uL (ref 150–400)
RBC: 4.47 MIL/uL (ref 3.87–5.11)
RDW: 20.4 % — ABNORMAL HIGH (ref 11.5–15.5)
WBC: 4 10*3/uL (ref 4.0–10.5)
nRBC: 0 % (ref 0.0–0.2)

## 2019-08-20 LAB — COMPREHENSIVE METABOLIC PANEL
ALT: 737 U/L — ABNORMAL HIGH (ref 0–44)
ALT: 576 U/L — ABNORMAL HIGH (ref 0–44)
AST: 555 U/L — ABNORMAL HIGH (ref 15–41)
AST: 395 U/L — ABNORMAL HIGH (ref 15–41)
Albumin: 2.9 g/dL — ABNORMAL LOW (ref 3.5–5.0)
Albumin: 2.3 g/dL — ABNORMAL LOW (ref 3.5–5.0)
Alkaline Phosphatase: 99 U/L (ref 38–126)
Alkaline Phosphatase: 81 U/L (ref 38–126)
Anion gap: 15 (ref 5–15)
Anion gap: 11 (ref 5–15)
BUN: 68 mg/dL — ABNORMAL HIGH (ref 8–23)
BUN: 58 mg/dL — ABNORMAL HIGH (ref 8–23)
CO2: 28 mmol/L (ref 22–32)
CO2: 25 mmol/L (ref 22–32)
Calcium: 9.3 mg/dL (ref 8.9–10.3)
Calcium: 8 mg/dL — ABNORMAL LOW (ref 8.9–10.3)
Chloride: 88 mmol/L — ABNORMAL LOW (ref 98–111)
Chloride: 98 mmol/L (ref 98–111)
Creatinine, Ser: 1.51 mg/dL — ABNORMAL HIGH (ref 0.44–1.00)
Creatinine, Ser: 1.11 mg/dL — ABNORMAL HIGH (ref 0.44–1.00)
GFR calc Af Amer: 42 mL/min — ABNORMAL LOW (ref >60)
GFR calc Af Amer: >60 mL/min (ref >60)
GFR calc non Af Amer: 36 mL/min — ABNORMAL LOW (ref >60)
GFR calc non Af Amer: 53 mL/min — ABNORMAL LOW (ref >60)
Glucose, Bld: 132 mg/dL — ABNORMAL HIGH (ref 70–99)
Glucose, Bld: 135 mg/dL — ABNORMAL HIGH (ref 70–99)
Potassium: 2.2 mmol/L — CL (ref 3.5–5.1)
Potassium: 2.6 mmol/L — CL (ref 3.5–5.1)
Sodium: 131 mmol/L — ABNORMAL LOW (ref 135–145)
Sodium: 134 mmol/L — ABNORMAL LOW (ref 135–145)
Total Bilirubin: 1.1 mg/dL (ref 0.3–1.2)
Total Bilirubin: 1 mg/dL (ref 0.3–1.2)
Total Protein: 6.3 g/dL — ABNORMAL LOW (ref 6.5–8.1)
Total Protein: 5.3 g/dL — ABNORMAL LOW (ref 6.5–8.1)

## 2019-08-20 LAB — TROPONIN I (HIGH SENSITIVITY)
Troponin I (High Sensitivity): 55 ng/L — ABNORMAL HIGH (ref <18)
Troponin I (High Sensitivity): 53 ng/L — ABNORMAL HIGH (ref <18)

## 2019-08-20 LAB — LACTIC ACID, PLASMA
Lactic Acid, Venous: 1.9 mmol/L (ref 0.5–1.9)
Lactic Acid, Venous: 1.7 mmol/L (ref 0.5–1.9)

## 2019-08-20 LAB — SALICYLATE LEVEL: Salicylate Lvl: <7 mg/dL (ref 2.8–30.0)

## 2019-08-20 LAB — POC OCCULT BLOOD, ED: Fecal Occult Bld: NEGATIVE

## 2019-08-20 LAB — LIPASE, BLOOD: Lipase: 59 U/L — ABNORMAL HIGH (ref 11–51)

## 2019-08-20 LAB — SARS CORONAVIRUS 2 BY RT PCR (HOSPITAL ORDER, PERFORMED IN ~~LOC~~ HOSPITAL LAB): SARS Coronavirus 2: NEGATIVE

## 2019-08-20 LAB — ACETAMINOPHEN LEVEL: Acetaminophen (Tylenol), Serum: 42 ug/mL — ABNORMAL HIGH (ref 10–30)

## 2019-08-20 MED ORDER — MOMETASONE FURO-FORMOTEROL FUM 200-5 MCG/ACT IN AERO
INHALATION_SPRAY | RESPIRATORY_TRACT | Status: AC
  Filled 2019-08-20: qty 8.8

## 2019-08-20 MED ORDER — LORAZEPAM 1 MG PO TABS: 1.0000 mg | ORAL_TABLET | ORAL | Status: AC | PRN

## 2019-08-20 MED ORDER — POTASSIUM CHLORIDE 10 MEQ/100ML IV SOLN
10.0000 meq | INTRAVENOUS | Status: AC
  Administered 2019-08-20 (×3): 10 meq via INTRAVENOUS
  Filled 2019-08-20 (×3): qty 100

## 2019-08-20 MED ORDER — SODIUM CHLORIDE 0.9 % IV BOLUS
500.0000 mL | Freq: Once | INTRAVENOUS | Status: AC
  Administered 2019-08-20: 500 mL via INTRAVENOUS

## 2019-08-20 MED ORDER — CITALOPRAM HYDROBROMIDE 20 MG PO TABS
20.0000 mg | ORAL_TABLET | Freq: Every day | ORAL | Status: DC
  Administered 2019-08-20 – 2019-08-21 (×2): 20 mg via ORAL
  Filled 2019-08-20 (×2): qty 1

## 2019-08-20 MED ORDER — ATROPINE SULFATE 1 MG/10ML IJ SOSY
0.5000 mg | PREFILLED_SYRINGE | Freq: Once | INTRAMUSCULAR | Status: AC
  Administered 2019-08-20: 0.5 mg via INTRAVENOUS
  Filled 2019-08-20: qty 10

## 2019-08-20 MED ORDER — ENOXAPARIN SODIUM 40 MG/0.4ML ~~LOC~~ SOLN: 40.0000 mg | SUBCUTANEOUS | Status: DC

## 2019-08-20 MED ORDER — SODIUM CHLORIDE 0.9 % IV BOLUS
1000.0000 mL | Freq: Once | INTRAVENOUS | Status: AC
  Administered 2019-08-20: 1000 mL via INTRAVENOUS

## 2019-08-20 MED ORDER — VITAMIN B-1 100 MG PO TABS
100.0000 mg | ORAL_TABLET | Freq: Every day | ORAL | Status: DC
  Administered 2019-08-20 – 2019-08-31 (×9): 100 mg via ORAL
  Filled 2019-08-20 (×9): qty 1

## 2019-08-20 MED ORDER — MOMETASONE FURO-FORMOTEROL FUM 200-5 MCG/ACT IN AERO
1.0000 | INHALATION_SPRAY | Freq: Two times a day (BID) | RESPIRATORY_TRACT | Status: DC
  Administered 2019-08-20 – 2019-08-31 (×22): 1 via RESPIRATORY_TRACT
  Filled 2019-08-20: qty 8.8

## 2019-08-20 MED ORDER — PRO-STAT SUGAR FREE PO LIQD
30.0000 mL | Freq: Two times a day (BID) | ORAL | Status: DC
  Administered 2019-08-20 – 2019-08-29 (×11): 30 mL via ORAL
  Filled 2019-08-20 (×20): qty 30

## 2019-08-20 MED ORDER — LORAZEPAM 2 MG/ML IJ SOLN
0.0000 mg | INTRAMUSCULAR | Status: AC
  Administered 2019-08-20: 19:00:00 2 mg via INTRAVENOUS
  Administered 2019-08-21: 06:00:00 1 mg via INTRAVENOUS
  Filled 2019-08-20 (×2): qty 1

## 2019-08-20 MED ORDER — ATROPINE SULFATE 1 MG/10ML IJ SOSY: 1.0000 mg | PREFILLED_SYRINGE | Freq: Once | INTRAMUSCULAR | Status: DC

## 2019-08-20 MED ORDER — NAPHAZOLINE-GLYCERIN 0.012-0.2 % OP SOLN
1.0000 [drp] | Freq: Four times a day (QID) | OPHTHALMIC | Status: DC | PRN
  Filled 2019-08-20: qty 15

## 2019-08-20 MED ORDER — POTASSIUM CHLORIDE 10 MEQ/100ML IV SOLN
10.0000 meq | INTRAVENOUS | Status: AC
  Administered 2019-08-20 (×3): 10 meq via INTRAVENOUS
  Filled 2019-08-20 (×3): qty 100

## 2019-08-20 MED ORDER — ADULT MULTIVITAMIN W/MINERALS CH: 1.0000 | ORAL_TABLET | Freq: Every day | ORAL | Status: DC

## 2019-08-20 MED ORDER — TRAZODONE HCL 50 MG PO TABS
100.0000 mg | ORAL_TABLET | Freq: Every day | ORAL | Status: DC
  Administered 2019-08-20 – 2019-08-21 (×2): 100 mg via ORAL
  Filled 2019-08-20 (×2): qty 2

## 2019-08-20 MED ORDER — POTASSIUM CHLORIDE IN NACL 20-0.9 MEQ/L-% IV SOLN
INTRAVENOUS | Status: DC
  Administered 2019-08-20 – 2019-08-21 (×2): via INTRAVENOUS

## 2019-08-20 MED ORDER — PANTOPRAZOLE SODIUM 40 MG IV SOLR
40.0000 mg | Freq: Two times a day (BID) | INTRAVENOUS | Status: DC
  Administered 2019-08-20 – 2019-08-25 (×10): 40 mg via INTRAVENOUS
  Filled 2019-08-20 (×10): qty 40

## 2019-08-20 MED ORDER — FOLIC ACID 1 MG PO TABS
1.0000 mg | ORAL_TABLET | Freq: Every day | ORAL | Status: DC
  Administered 2019-08-20 – 2019-08-31 (×9): 1 mg via ORAL
  Filled 2019-08-20 (×9): qty 1

## 2019-08-20 MED ORDER — NICOTINE 21 MG/24HR TD PT24
21.0000 mg | MEDICATED_PATCH | Freq: Every day | TRANSDERMAL | Status: DC
  Administered 2019-08-20 – 2019-08-31 (×12): 21 mg via TRANSDERMAL
  Filled 2019-08-20 (×12): qty 1

## 2019-08-20 MED ORDER — ENOXAPARIN SODIUM 30 MG/0.3ML ~~LOC~~ SOLN
30.0000 mg | SUBCUTANEOUS | Status: DC
  Administered 2019-08-20: 19:00:00 30 mg via SUBCUTANEOUS
  Filled 2019-08-20: qty 0.3

## 2019-08-20 MED ORDER — ENSURE ENLIVE PO LIQD: 237.0000 mL | Freq: Three times a day (TID) | ORAL | Status: DC

## 2019-08-20 MED ORDER — ALBUTEROL SULFATE (2.5 MG/3ML) 0.083% IN NEBU
3.0000 mL | INHALATION_SOLUTION | Freq: Four times a day (QID) | RESPIRATORY_TRACT | Status: DC | PRN
  Administered 2019-08-25 – 2019-08-28 (×6): 3 mL via RESPIRATORY_TRACT
  Filled 2019-08-20 (×6): qty 3

## 2019-08-20 MED ORDER — ADULT MULTIVITAMIN W/MINERALS CH
1.0000 | ORAL_TABLET | Freq: Every day | ORAL | Status: DC
  Administered 2019-08-20 – 2019-08-21 (×2): 1 via ORAL
  Filled 2019-08-20 (×2): qty 1

## 2019-08-20 MED ORDER — THIAMINE HCL 100 MG/ML IJ SOLN
100.0000 mg | Freq: Every day | INTRAMUSCULAR | Status: DC
  Administered 2019-08-22 – 2019-08-24 (×3): 100 mg via INTRAVENOUS
  Filled 2019-08-20 (×5): qty 2

## 2019-08-20 MED ORDER — LORAZEPAM 2 MG/ML IJ SOLN
0.0000 mg | Freq: Three times a day (TID) | INTRAMUSCULAR | Status: AC
  Administered 2019-08-23 – 2019-08-24 (×2): 1 mg via INTRAVENOUS
  Filled 2019-08-20: qty 1

## 2019-08-20 MED ORDER — ACETAMINOPHEN 325 MG PO TABS
650.0000 mg | ORAL_TABLET | Freq: Once | ORAL | Status: AC
  Administered 2019-08-20: 650 mg via ORAL
  Filled 2019-08-20: qty 2

## 2019-08-20 MED ORDER — LORAZEPAM 2 MG/ML IJ SOLN
1.0000 mg | INTRAMUSCULAR | Status: AC | PRN
  Filled 2019-08-20: qty 1

## 2019-08-20 MED ORDER — CHLORHEXIDINE GLUCONATE CLOTH 2 % EX PADS
6.0000 | MEDICATED_PAD | Freq: Every day | CUTANEOUS | Status: DC
  Administered 2019-08-20 – 2019-08-28 (×9): 6 via TOPICAL

## 2019-08-20 MED ORDER — ONDANSETRON HCL 4 MG/2ML IJ SOLN
4.0000 mg | Freq: Four times a day (QID) | INTRAMUSCULAR | Status: DC | PRN
  Administered 2019-08-23 – 2019-08-28 (×6): 4 mg via INTRAVENOUS
  Filled 2019-08-20 (×6): qty 2

## 2019-08-20 NOTE — ED Notes (Signed)
Date and time results received: 08/20/19 2:01 PM   Test: k+ Critical Value: 2.2  Name of Provider Notified: Dr Roderic Palau  Orders Received? Or Actions Taken?:

## 2019-08-20 NOTE — H&P (Signed)
TRH H&P    Patient Demographics:    Kristine Roberts, is a 63 y.o. female  MRN: [id number]  DOB - [birth year]  Admit Date - 08/20/2019  Referring MD/NP/PA: Shelby Dubin  Outpatient Primary MD for the patient is [facility]  Patient coming from:  home  Chief complaint-   Back pain   HPI:    Kristine Roberts  is a 63 y.o. female,  w h/o [name], Hiatal hernia, Esophagitis,  Gastric outlet obstruction, PUD, Copd, apparently c/o back pain, and also nausea, emesis over the past year.  Pt states that has not been able to eat well.  Pt generally weak due to poor po intake.    In ED  CT abd/ pelvis IMPRESSION: 1. Interval endplate compression fractures at L1, L3, and L4, compatible with osteoporosis. 2. Prior gastrojejunostomy, along with anastomotic staple line along what is likely a small bowel loop in the pelvis. The bowel loops are difficult to separate due to the patient's striking paucity of bodily adipose tissue including intra-abdominal adipose tissue. There is formed stool in the colon and I do not see definite dilated bowel. 3. Left kidney upper pole cyst.  No definite urinary tract calculi.  Wbc 4.0, Hgb 12.7, Plt 318 Na 131, K 2.2, Bun 68, Creatinine 1.51 Ast 555, Alt 737, Alk phos 99, T. Bili 1.1 Lipase 59 Trop 55-> 53  Ekg nsr at 48, nl axis, nl int, RBBB  Pt will be admitted for back pain, and chronic n/v , and abnormal liver function.     Review of systems:    In addition to the HPI above,  No Fever-chills, No Headache, No changes with Vision or hearing, No problems swallowing food or Liquids, No Chest pain, Cough or Shortness of Breath, No Abdominal pain, bowel movements are regular, No Blood in stool or Urine, No dysuria, No new skin rashes or bruises, No new joints pains-aches,  No new weakness, tingling, numbness in any extremity, No recent weight gain or  loss, No polyuria, polydypsia or polyphagia, No significant Mental Stressors.  All other systems reviewed and are negative.    Past History of the following :    Past Medical History:  Diagnosis Date   COPD (chronic obstructive pulmonary disease) (HCC)    Dysphagia    Esophagitis    Gastric outlet obstruction    Gastric stenosis    GERD (gastroesophageal reflux disease)    Hiatal hernia    PUD (peptic ulcer disease)    Wounds, multiple       Past Surgical History:  Procedure Laterality Date   APPENDECTOMY     BALLOON DILATION N/A 03/05/2016   Procedure: BALLOON DILATION;  Surgeon: Rogene Houston, MD;  [facility];  Service: Endoscopy;  Laterality: N/A;  pyloric channel dialtaion   BALLOON DILATION N/A 07/14/2018   Procedure: BALLOON DILATION;  Surgeon: Jackquline Denmark, MD;  Location: [facility] ENDOSCOPY;  Service: Endoscopy;  Laterality: N/A;   BIOPSY  07/14/2018   Procedure: BIOPSY;  Surgeon: Jackquline Denmark, MD;  [facility];  Service: Endoscopy;;   CHOLECYSTECTOMY     ESOPHAGEAL DILATION N/A 03/03/2016   Procedure: ESOPHAGEAL DILATION;  Surgeon: Rogene Houston, MD;  [facility];  Service: Endoscopy;  Laterality: N/A;   ESOPHAGEAL DILATION N/A 05/07/2016   Procedure: ESOPHAGEAL DILATION;  Surgeon: Rogene Houston, MD;  [facility];  Service: Endoscopy;  Laterality: N/A;   ESOPHAGEAL DILATION  01/26/2018   Procedure: DILATION OF ANASTOMOTIC  STRICTURE;  Surgeon: Rogene Houston, MD;  [facility];  Service: Endoscopy;;   ESOPHAGOGASTRODUODENOSCOPY N/A 03/03/2016   Procedure: ESOPHAGOGASTRODUODENOSCOPY (EGD);  Surgeon: Rogene Houston, MD;  [facility];  Service: Endoscopy;  Laterality: N/A;  2:00   ESOPHAGOGASTRODUODENOSCOPY N/A 05/07/2016   Procedure: ESOPHAGOGASTRODUODENOSCOPY (EGD);  Surgeon: Rogene Houston, MD;  [facility];  Service: Endoscopy;  Laterality: N/A;  855 - moved to 6/2 @  10:15 - Ann notified pt   ESOPHAGOGASTRODUODENOSCOPY N/A 12/28/2017   Procedure: ESOPHAGOGASTRODUODENOSCOPY (EGD) with stricture dilation;  Surgeon: Rogene Houston, MD;  [facility];  Service: Endoscopy;  Laterality: N/A;   ESOPHAGOGASTRODUODENOSCOPY N/A 01/26/2018   Procedure: ESOPHAGOGASTRODUODENOSCOPY (EGD);  Surgeon: Rogene Houston, MD;  [facility];  Service: Endoscopy;  Laterality: N/A;  255   ESOPHAGOGASTRODUODENOSCOPY N/A 07/14/2018   Procedure: ESOPHAGOGASTRODUODENOSCOPY (EGD);  Surgeon: Jackquline Denmark, MD;  Location[facility] ENDOSCOPY;  Service: Endoscopy;  Laterality: N/A;   ESOPHAGOGASTRODUODENOSCOPY (EGD) WITH PROPOFOL N/A 03/05/2016   Procedure: ESOPHAGOGASTRODUODENOSCOPY (EGD) WITH PROPOFOL Anastomotic stricture dilation ;  Surgeon: Rogene Houston, MD;  [facility];  Service: Endoscopy;  Laterality: N/A;  to be done in OR under fluoro   INTRAMEDULLARY (IM) NAIL INTERTROCHANTERIC Right 07/09/2018   Procedure: INTRAMEDULLARY (IM) NAIL INTERTROCHANTRIC;  Surgeon: Nicholes Stairs, MD;  Location: [facility];  Service: Orthopedics;  Laterality: Right;   STOMACH SURGERY     TOTAL HIP ARTHROPLASTY        Social History:      Social History   Tobacco Use   Smoking status: Current Every Day Smoker    Packs/day: 1.50    Years: 20.00    Pack years: 30.00    Types: Cigarettes   Smokeless tobacco: Never Used  Substance Use Topics   Alcohol use: Not Currently    Alcohol/week: 12.0 standard drinks    Types: 12 Cans of beer per week    Comment: weekly       Family History :     Family History  Problem Relation Age of Onset   Alzheimer's disease Mother    COPD Mother    Throat cancer Father    Breast cancer Sister        Home Medications:   Prior to Admission medications   Medication Sig Start Date End Date Taking? Authorizing Provider  albuterol (PROVENTIL HFA;VENTOLIN HFA) 108 (90 Base) MCG/ACT inhaler Inhale 2 puffs into  the lungs every 6 (six) hours as needed for wheezing or shortness of breath.    Yes [provider]  citalopram (CELEXA) 20 MG tablet Take 20 mg by mouth daily. 06/23/18  Yes [provider]  DULERA 200-5 MCG/ACT AERO Inhale 1 puff into the lungs 2 (two) times daily.  12/22/17  Yes [provider]  feeding supplement, ENSURE ENLIVE, (ENSURE ENLIVE) LIQD Take 237 mLs by mouth 3 (three) times daily between meals. 12/30/17  Yes Roxan Hockey, MD  Multiple Vitamins-Minerals (MULTIVITAMIN WITH MINERALS) tablet Take 1  tablet by mouth daily.   Yes [provider]  tetrahydrozoline 0.05 % ophthalmic solution Place 1 drop into both eyes daily.   Yes [provider]  traZODone (DESYREL) 100 MG tablet Take 1 tablet (100 mg total) by mouth at bedtime. 12/30/17  Yes Roxan Hockey, MD  celecoxib (CELEBREX) 100 MG capsule Take 1 pill every 12 hours as needed for pain Patient not taking: Reported on 08/20/2019 09/18/18   Milton Ferguson, MD  HYDROcodone-acetaminophen (NORCO/VICODIN) 5-325 MG tablet Take 1 tablet by mouth every 4 (four) hours as needed. Patient not taking: Reported on 08/20/2019 08/20/18   Evalee Jefferson, PA-C  pantoprazole sodium (PROTONIX) 40 mg/20 mL PACK Take 20 mLs (40 mg total) by mouth daily. Patient not taking: Reported on 08/20/2019 07/16/18   Patrecia Pour, MD     Allergies:     Allergies  Allergen Reactions   Penicillins Itching, Swelling and Other (See Comments)    Tongue swells Has patient had a PCN reaction causing immediate rash, facial/tongue/throat swelling, SOB or lightheadedness with hypotension: Yes, had a rash and tongue swelled up. No SOB and lightheadedness. Has patient had a PCN reaction causing severe rash involving mucus membranes or skin necrosis: No Has patient had a PCN reaction that required hospitalization: No Has patient had a PCN reaction occurring within the last 10 years: No If all of the above answers are "NO", then  may procee     Physical Exam:   Vitals  Blood pressure (!) 81/63, pulse (!) 48, temperature 97.8 F (36.6 C), temperature source Oral, resp. rate 13, height 5' 4"  (1.626 m), weight 27.8 kg, SpO2 97 %.  1.  General: axoxo3  2. Psychiatric: euthymic  3. Neurologic: cn2-12 intact, reflexes 2+ symmetric, direct, consensual, near intact  4. HEENMT:  Anciteric, pupils 1.59m symmetric, direct , consensual, near intact Neck: no jvd  5. Respiratory : CTAB  6. Cardiovascular : rrr s1, s2, no m/g/r    7. Gastrointestinal:  Abd: soft, nt, nd, +bs  8. Skin:  Ext: no c/c/e, no rash  9.Musculoskeletal:  Good ROM    Data Review:    CBC Recent Labs  Lab 08/20/19 1249  WBC 4.0  HGB 12.7  HCT 39.2  PLT 318  MCV 87.7  MCH 28.4  MCHC 32.4  RDW 20.4*  LYMPHSABS 1.0  MONOABS 0.6  EOSABS 0.0  BASOSABS 0.0   ------------------------------------------------------------------------------------------------------------------  Results for orders placed or performed during the hospital encounter of 08/20/19 (from the past 48 hour(s))  CBC with Differential     Status: Abnormal   Collection Time: 08/20/19 12:49 PM  Result Value Ref Range   WBC 4.0 4.0 - 10.5 K/uL   RBC 4.47 3.87 - 5.11 MIL/uL   Hemoglobin 12.7 12.0 - 15.0 g/dL   HCT 39.2 36.0 - 46.0 %   MCV 87.7 80.0 - 100.0 fL   MCH 28.4 26.0 - 34.0 pg   MCHC 32.4 30.0 - 36.0 g/dL   RDW 20.4 (H) 11.5 - 15.5 %   Platelets 318 150 - 400 K/uL   nRBC 0.0 0.0 - 0.2 %   Neutrophils Relative % 58 %   Neutro Abs 2.4 1.7 - 7.7 K/uL   Lymphocytes Relative 25 %   Lymphs Abs 1.0 0.7 - 4.0 K/uL   Monocytes Relative 14 %   Monocytes Absolute 0.6 0.1 - 1.0 K/uL   Eosinophils Relative 1 %   Eosinophils Absolute 0.0 0.0 - 0.5 K/uL   Basophils Relative 1 %  Basophils Absolute 0.0 0.0 - 0.1 K/uL   Immature Granulocytes 1 %   Abs Immature Granulocytes 0.02 0.00 - 0.07 K/uL    Comment: Performed at [facility], [address], [place], [place] [address]  Comprehensive metabolic panel     Status: Abnormal   Collection Time: 08/20/19 12:49 PM  Result Value Ref Range   Sodium 131 (L) 135 - 145 mmol/L   Potassium 2.2 (LL) 3.5 - 5.1 mmol/L    Comment: CRITICAL RESULT CALLED TO, READ BACK BY AND VERIFIED WITH: STIENHOWARD,C AT 1400 ON 9.14.20 BY ISLEY,B    Chloride 88 (L) 98 - 111 mmol/L   CO2 28 22 - 32 mmol/L   Glucose, Bld 132 (H) 70 - 99 mg/dL   BUN 68 (H) 8 - 23 mg/dL   Creatinine, Ser 1.51 (H) 0.44 - 1.00 mg/dL   Calcium 9.3 8.9 - 10.3 mg/dL   Total Protein 6.3 (L) 6.5 - 8.1 g/dL   Albumin 2.9 (L) 3.5 - 5.0 g/dL   AST 555 (H) 15 - 41 U/L   ALT 737 (H) 0 - 44 U/L   Alkaline Phosphatase 99 38 - 126 U/L   Total Bilirubin 1.1 0.3 - 1.2 mg/dL   GFR calc non Af Amer 36 (L) >60 mL/min   GFR calc Af Amer 42 (L) >60 mL/min   Anion gap 15 5 - 15    Comment: Performed at [facility], [address], [place], [place] [address]  Lipase, blood     Status: Abnormal   Collection Time: 08/20/19 12:49 PM  Result Value Ref Range   Lipase 59 (H) 11 - 51 U/L    Comment: Performed at [facility], [address] [place], [place] [address]  Troponin I (High Sensitivity)     Status: Abnormal   Collection Time: 08/20/19 12:55 PM  Result Value Ref Range   Troponin I (High Sensitivity) 55 (H) <18 ng/L    Comment: (NOTE) Elevated high sensitivity troponin I (hsTnI) values and significant  changes across serial measurements may suggest ACS but many other  chronic and acute conditions are known to elevate hsTnI results.  Refer to the "Links" section for chest pain algorithms and additional  guidance. Performed at [facility], [address], [place], [place] [address]   Lactic acid, plasma     Status: None   Collection Time: 08/20/19  1:15 PM  Result Value Ref Range   Lactic Acid, Venous 1.9 0.5 - 1.9 mmol/L    Comment: Performed at [facility], [address], [place], [place] [address]  POC occult blood, ED     Status:  None   Collection Time: 08/20/19  2:48 PM  Result Value Ref Range   Fecal Occult Bld NEGATIVE NEGATIVE  Lactic acid, plasma     Status: None   Collection Time: 08/20/19  2:50 PM  Result Value Ref Range   Lactic Acid, Venous 1.7 0.5 - 1.9 mmol/L    Comment: Performed at [facility], [address] [place], [place] [address]  Troponin I (High Sensitivity)     Status: Abnormal   Collection Time: 08/20/19  2:50 PM  Result Value Ref Range   Troponin I (High Sensitivity) 53 (H) <18 ng/L    Comment: (NOTE) Elevated high sensitivity troponin I (hsTnI) values and significant  changes across serial measurements may suggest ACS but many other  chronic and acute conditions are known to elevate hsTnI results.  Refer to the "Links" section for chest  pain algorithms and additional  guidance. Performed at [facility], [address] [place], [place] [address]   Comprehensive metabolic panel     Status: Abnormal   Collection Time: 08/20/19  2:50 PM  Result Value Ref Range   Sodium 134 (L) 135 - 145 mmol/L   Potassium 2.6 (LL) 3.5 - 5.1 mmol/L    Comment: CRITICAL RESULT CALLED TO, READ BACK BY AND VERIFIED WITH: [email or web address].A ON 08/20/19 AT 1825 BY LOY,C    Chloride 98 98 - 111 mmol/L   CO2 25 22 - 32 mmol/L   Glucose, Bld 135 (H) 70 - 99 mg/dL   BUN 58 (H) 8 - 23 mg/dL   Creatinine, Ser 1.11 (H) 0.44 - 1.00 mg/dL   Calcium 8.0 (L) 8.9 - 10.3 mg/dL   Total Protein 5.3 (L) 6.5 - 8.1 g/dL   Albumin 2.3 (L) 3.5 - 5.0 g/dL   AST 395 (H) 15 - 41 U/L   ALT 576 (H) 0 - 44 U/L   Alkaline Phosphatase 81 38 - 126 U/L   Total Bilirubin 1.0 0.3 - 1.2 mg/dL   GFR calc non Af Amer 53 (L) >60 mL/min   GFR calc Af Amer >60 >60 mL/min   Anion gap 11 5 - 15    Comment: Performed at [facility], [address] [place], [place] [address]  SARS Coronavirus 2 [place] order, Performed in [facility] hospital lab) Nasopharyngeal Nasopharyngeal Swab     Status: None   Collection Time: 08/20/19  3:25 PM    Specimen: Nasopharyngeal Swab  Result Value Ref Range   SARS Coronavirus 2 NEGATIVE NEGATIVE    Comment: (NOTE) If result is NEGATIVE SARS-CoV-2 target nucleic acids are NOT DETECTED. The SARS-CoV-2 RNA is generally detectable in upper and lower  respiratory specimens during the acute phase of infection. The lowest  concentration of SARS-CoV-2 viral copies this assay can detect is 250  copies / mL. A negative result does not preclude SARS-CoV-2 infection  and should not be used as the sole basis for treatment or other  patient management decisions.  A negative result may occur with  improper specimen collection / handling, submission of specimen other  than nasopharyngeal swab, presence of viral mutation(s) within the  areas targeted by this assay, and inadequate number of viral copies  (<250 copies / mL). A negative result must be combined with clinical  observations, patient history, and epidemiological information. If result is POSITIVE SARS-CoV-2 target nucleic acids are DETECTED. The SARS-CoV-2 RNA is generally detectable in upper and lower  respiratory specimens dur ing the acute phase of infection.  Positive  results are indicative of active infection with SARS-CoV-2.  Clinical  correlation with patient history and other diagnostic information is  necessary to determine patient infection status.  Positive results do  not rule out bacterial infection or co-infection with other viruses. If result is PRESUMPTIVE POSTIVE SARS-CoV-2 nucleic acids MAY BE PRESENT.   A presumptive positive result was obtained on the submitted specimen  and confirmed on repeat testing.  While 2019 novel coronavirus  (SARS-CoV-2) nucleic acids may be present in the submitted sample  additional confirmatory testing may be necessary for epidemiological  and / or clinical management purposes  to differentiate between  SARS-CoV-2 and other Sarbecovirus currently known to infect humans.  If clinically  indicated additional testing with an alternate test  methodology [phone]) is advised. The SARS-CoV-2 RNA is generally  detectable in upper and lower respiratory sp ecimens during the  acute  phase of infection. The expected result is Negative. Fact Sheet for Patients:  StrictlyIdeas.no Fact Sheet for Healthcare Providers: BankingDealers.co.za This test is not yet approved or cleared by the [place] FDA and has been authorized for detection and/or diagnosis of SARS-CoV-2 by FDA under an Emergency Use Authorization (EUA).  This EUA will remain in effect (meaning this test can be used) for the duration of the COVID-19 declaration under Section 564(b)(1) of the Act, 21 U.S.C. section 360bbb-3(b)(1), unless the authorization is terminated or revoked sooner. Performed at [facility], [address], [place], [place] [address]   Acetaminophen level     Status: Abnormal   Collection Time: 08/20/19  5:23 PM  Result Value Ref Range   Acetaminophen (Tylenol), Serum 42 (H) 10 - 30 ug/mL    Comment: (NOTE) Therapeutic concentrations vary significantly. A range of 10-30 ug/mL  may be an effective concentration for many patients. However, some  are best treated at concentrations outside of this range. Acetaminophen concentrations >150 ug/mL at 4 hours after ingestion  and >50 ug/mL at 12 hours after ingestion are often associated with  toxic reactions. Performed at [facility], [address], [place], [place] [address]   Salicylate level     Status: None   Collection Time: 08/20/19  5:23 PM  Result Value Ref Range   Salicylate Lvl <8.7 2.8 - 30.0 mg/dL    Comment: Performed at [facility], [address], [place], [place] [address]    Chemistries  Recent Labs  Lab 08/20/19 1249 08/20/19 1450  NA 131* 134*  K 2.2* 2.6*  CL 88* 98  CO2 28 25  GLUCOSE 132* 135*  BUN 68* 58*  CREATININE 1.51* 1.11*  CALCIUM 9.3 8.0*  AST 555* 395*   ALT 737* 576*  ALKPHOS 99 81  BILITOT 1.1 1.0   ------------------------------------------------------------------------------------------------------------------  ------------------------------------------------------------------------------------------------------------------ GFR: Estimated Creatinine Clearance: 22.8 mL/min (A) (by C-G formula based on SCr of 1.11 mg/dL (H)). Liver Function Tests: Recent Labs  Lab 08/20/19 1249 08/20/19 1450  AST 555* 395*  ALT 737* 576*  ALKPHOS 99 81  BILITOT 1.1 1.0  PROT 6.3* 5.3*  ALBUMIN 2.9* 2.3*   Recent Labs  Lab 08/20/19 1249  LIPASE 59*   No results for input(s): AMMONIA in the last 168 hours. Coagulation Profile: No results for input(s): INR, PROTIME in the last 168 hours. Cardiac Enzymes: No results for input(s): CKTOTAL, CKMB, CKMBINDEX, TROPONINI in the last 168 hours. BNP (last 3 results) No results for input(s): PROBNP in the last 8760 hours. HbA1C: No results for input(s): HGBA1C in the last 72 hours. CBG: No results for input(s): GLUCAP in the last 168 hours. Lipid Profile: No results for input(s): CHOL, HDL, LDLCALC, TRIG, CHOLHDL, LDLDIRECT in the last 72 hours. Thyroid Function Tests: No results for input(s): TSH, T4TOTAL, FREET4, T3FREE, THYROIDAB in the last 72 hours. Anemia Panel: No results for input(s): VITAMINB12, FOLATE, FERRITIN, TIBC, IRON, RETICCTPCT in the last 72 hours.  --------------------------------------------------------------------------------------------------------------- Urine analysis:    Component Value Date/Time   COLORURINE YELLOW 12/26/2017 1006   APPEARANCEUR CLEAR 12/26/2017 1006   LABSPEC 1.026 12/26/2017 1006   PHURINE 5.0 12/26/2017 1006   GLUCOSEU NEGATIVE 12/26/2017 1006   HGBUR NEGATIVE 12/26/2017 1006   BILIRUBINUR NEGATIVE 12/26/2017 1006   KETONESUR 5 (A) 12/26/2017 1006   PROTEINUR NEGATIVE 12/26/2017 1006   NITRITE NEGATIVE 12/26/2017 1006   LEUKOCYTESUR  NEGATIVE 12/26/2017 1006      Imaging Results:    Ct  Abdomen Pelvis Wo Contrast  Result Date: 08/20/2019 CLINICAL DATA:  Back pain. Nausea and vomiting. Weight loss. Abnormal liver function. EXAM: CT ABDOMEN AND PELVIS WITHOUT CONTRAST TECHNIQUE: Multidetector CT imaging of the abdomen and pelvis was performed following the standard protocol without IV contrast. COMPARISON:  Multiple exams, including 07/14/2018 and 10/25/2014 FINDINGS: Lower chest: 5 by 4 mm right lower lobe pulmonary nodule along the diaphragm, image 15/4, no change from 10/25/2014, considered benign. Calcified granuloma in the left lower lobe on image 7/4, benign. Hepatobiliary: Cholecystectomy.  Otherwise unremarkable. Pancreas: Considerable atrophy. Spleen: Unremarkable Adrenals/Urinary Tract: Hypodense 1.8 cm left kidney upper pole lesion, likely a cyst, previously 1.6 cm. Adrenal glands unremarkable. No urinary tract calculi are identified. Stomach/Bowel: Postoperative findings including gastrojejunostomy at not stenotic staple lines in upper pelvic loops of bowel Vascular/Lymphatic: Aortoiliac atherosclerotic vascular disease. No well-defined adenopathy. Reproductive: Uterus is thought to be absent.  Adnexa unremarkable. Other: Severe paucity of adipose tissue favoring cachexia. This makes separation of adjacent abdominal structures such as bowel loops problematic. Musculoskeletal: Right hip screw. Bony demineralization. Interval mild inferior endplate compression fractures at L3 and L4. Interval mild superior endplate compression fracture at L1. Old superior endplate compression fracture L5. IMPRESSION: 1. Interval endplate compression fractures at L1, L3, and L4, compatible with osteoporosis. 2. Prior gastrojejunostomy, along with anastomotic staple line along what is likely a small bowel loop in the pelvis. The bowel loops are difficult to separate due to the patient's striking paucity of bodily adipose tissue including  intra-abdominal adipose tissue. There is formed stool in the colon and I do not see definite dilated bowel. 3. Left kidney upper pole cyst.  No definite urinary tract calculi. 4.  Aortic Atherosclerosis (ICD10-I70.0). Electronically Signed   By: Van Clines M.D.   On: 08/20/2019 17:18   Dg Abdomen Acute W/chest  Result Date: 08/20/2019 CLINICAL DATA:  Abdominal pain EXAM: DG ABDOMEN ACUTE W/ 1V CHEST COMPARISON:  Abdomen series December 26, 2017 FINDINGS: PA chest: Lungs are hyperexpanded. There are several scattered small calcified granulomas. There is no edema or consolidation. Heart size and pulmonary vascularity are normal. No adenopathy. There old healed rib fractures bilaterally. There is midthoracic dextroscoliosis. Supine and upright abdomen: There are multiple surgical clips in the abdomen and pelvis. There is moderate stool in the colon. There is no bowel dilatation or air-fluid level to suggest bowel obstruction. No free air. Postoperative changes noted in the proximal right femur. Bones are osteoporotic. IMPRESSION: No bowel obstruction or free air. Moderate stool in colon. Extensive postoperative change in the abdomen and pelvis. Lungs mildly hyperexpanded without edema or consolidation. Scattered calcified granulomas noted. Electronically Signed   By: Lowella Grip III M.D.   On: 08/20/2019 13:23     Assessment & Plan:    Principal Problem:   Hypotension Active Problems:   Hypokalemia   Nausea   Abnormal liver function   Hyponatremia  Hypotension Tele Trop I  Check cortisol Check cardiac echo  Elevated troponin Check cardiac echo as above  Hypokalemia Replete Check cmp in am  Nausea and emesis, h/o Gerd, esophagitis, PUD, gastric outlet obstruction PPI Zofran 3m iv q6h prn  GI consult requested in computer  Abnormal liver function ? Shock liver Check acute hepatitis panel Check cpk Check tylenol, salicylate level Check CT abd/ pelvis as  above  Hyponatremia Hydrate with ns iv Check cmp in am  Back pain L1-3 compression fractures Check MRI, consider vertebroplasty/ kyphoplasty  Norco  Protein calorie malnutrition prostat 322m  po bid  Copd Cont Dulera Cont Albuterol HFA   Insomnia  Cont Trazodone  Etoh use CIWA    DVT Prophylaxis-   Lovenox - SCDs   AM Labs Ordered, also please review Full Orders  Family Communication: Admission, patients condition and plan of care including tests being ordered have been discussed with the patient  who indicate understanding and agree with the plan and Code Status.  Code Status:  FULL CODE per patient, attempted to notify significant other that pt admitted to [facility], mailbox was full unable to leave message  Admission status: Inpatient Based on patients clinical presentation and evaluation of above clinical data, I have made determination that patient meets Inpatient criteria at this time.  Pt hypotensive, pt has elevated troponin and significantly elevated liver function.  Pt started on iv fluids,  And potassium replacement for K=2.2.  Pt will require > 2 nites stay.  Pt will be admitted for inpatient stay.   Time spent in minutes : 70    Jani Gravel M.D on 08/20/2019 at 10:41 PM

## 2019-08-20 NOTE — ED Provider Notes (Signed)
[facility] EMERGENCY DEPARTMENT Provider Note   CSN: [id number] Arrival date & time: 08/20/19  1017    History   Chief Complaint Chief Complaint  Patient presents with   Back Pain   HPI Kristine Roberts is a 63 y.o. female with past medical history significant for COPD, dysphasia, esophagitis, gastric outlet obstruction, gastric stenosis, GERD, hiatal hernia, PUD, protein malnutrition who presents for evaluation of generalized weakness.  Patient states she feels "drained."  Patient states she had this previously when "my labs are off."  Patient states over the last year she has emesis with almost every meal.  She is now trying soft foods however has difficulty keeping these down.  Patient states she has greater than 1 episode of vomiting almost daily.  She had this previously when she had obstruction in her GI tract.  She has had multiple esophageal dilations over the years.  She was last seen by GI in 2019 however has not followed up with them.  Patient with generalized weakness with near syncopal episodes over the last 6 months.  Patient states she has no energy to do anything.  But sometimes even while oral liquids she has emesis.  She is a daily smoker.  Denies alcohol use, headache, dizziness, lightheadedness chest pain, shortness of breath, diarrhea or dysuria.  Denies prior history of GI bleeds.  She has not taken anything for symptoms.  History obtained from patient and past medical records.  No interpreter was used.     HPI  Past Medical History:  Diagnosis Date   COPD (chronic obstructive pulmonary disease) (HCC)    Dysphagia    Esophagitis    Gastric outlet obstruction    Gastric stenosis    GERD (gastroesophageal reflux disease)    Hiatal hernia    PUD (peptic ulcer disease)    Wounds, multiple     Patient Active Problem List   Diagnosis Date Noted   Closed right hip fracture ([place]) 07/08/2018   Cellulitis of both lower extremities 07/08/2018   Gastric  ulcer 01/11/2018   Hypokalemia 12/26/2017   Cachexia ([place]) 12/26/2017   Hypotension 12/26/2017   Tobacco abuse 12/26/2017   ETOH abuse 12/26/2017   Anemia 12/26/2017   Pressure injury of skin 12/26/2017   Protein-calorie malnutrition, severe 03/05/2016   Gastric outlet obstruction 03/03/2016   COPD (chronic obstructive pulmonary disease) ([place]) 03/03/2016   Dysphagia 02/03/2016    Past Surgical History:  Procedure Laterality Date   APPENDECTOMY     BALLOON DILATION N/A 03/05/2016   Procedure: BALLOON DILATION;  Surgeon: Rogene Houston, MD;  [facility];  Service: Endoscopy;  Laterality: N/A;  pyloric channel dialtaion   BALLOON DILATION N/A 07/14/2018   Procedure: BALLOON DILATION;  Surgeon: Jackquline Denmark, MD;  Location: [facility] ENDOSCOPY;  Service: Endoscopy;  Laterality: N/A;   BIOPSY  07/14/2018   Procedure: BIOPSY;  Surgeon: Jackquline Denmark, MD;  Location: [facility];  Service: Endoscopy;;   CHOLECYSTECTOMY     ESOPHAGEAL DILATION N/A 03/03/2016   Procedure: ESOPHAGEAL DILATION;  Surgeon: Rogene Houston, MD;  [facility];  Service: Endoscopy;  Laterality: N/A;   ESOPHAGEAL DILATION N/A 05/07/2016   Procedure: ESOPHAGEAL DILATION;  Surgeon: Rogene Houston, MD;  [facility];  Service: Endoscopy;  Laterality: N/A;   ESOPHAGEAL DILATION  01/26/2018   Procedure: DILATION OF ANASTOMOTIC  STRICTURE;  Surgeon: Rogene Houston, MD;  [facility];  Service: Endoscopy;;   ESOPHAGOGASTRODUODENOSCOPY N/A 03/03/2016  Procedure: ESOPHAGOGASTRODUODENOSCOPY (EGD);  Surgeon: Rogene Houston, MD;  [facility];  Service: Endoscopy;  Laterality: N/A;  2:00   ESOPHAGOGASTRODUODENOSCOPY N/A 05/07/2016   Procedure: ESOPHAGOGASTRODUODENOSCOPY (EGD);  Surgeon: Rogene Houston, MD;  [facility];  Service: Endoscopy;  Laterality: N/A;  855 - moved to 6/2 @ 10:15 - Ann notified pt   ESOPHAGOGASTRODUODENOSCOPY N/A 12/28/2017    Procedure: ESOPHAGOGASTRODUODENOSCOPY (EGD) with stricture dilation;  Surgeon: Rogene Houston, MD;  [facility];  Service: Endoscopy;  Laterality: N/A;   ESOPHAGOGASTRODUODENOSCOPY N/A 01/26/2018   Procedure: ESOPHAGOGASTRODUODENOSCOPY (EGD);  Surgeon: Rogene Houston, MD;  [facility];  Service: Endoscopy;  Laterality: N/A;  255   ESOPHAGOGASTRODUODENOSCOPY N/A 07/14/2018   Procedure: ESOPHAGOGASTRODUODENOSCOPY (EGD);  Surgeon: Jackquline Denmark, MD;  Location: [facility] ENDOSCOPY;  Service: Endoscopy;  Laterality: N/A;   ESOPHAGOGASTRODUODENOSCOPY (EGD) WITH PROPOFOL N/A 03/05/2016   Procedure: ESOPHAGOGASTRODUODENOSCOPY (EGD) WITH PROPOFOL Anastomotic stricture dilation ;  Surgeon: Rogene Houston, MD;  [facility];  Service: Endoscopy;  Laterality: N/A;  to be done in OR under fluoro   INTRAMEDULLARY (IM) NAIL INTERTROCHANTERIC Right 07/09/2018   Procedure: INTRAMEDULLARY (IM) NAIL INTERTROCHANTRIC;  Surgeon: Nicholes Stairs, MD;  Location: [facility];  Service: Orthopedics;  Laterality: Right;   STOMACH SURGERY     TOTAL HIP ARTHROPLASTY       OB History   No obstetric history on file.      Home Medications    Prior to Admission medications   Medication Sig Start Date End Date Taking? Authorizing Provider  albuterol (PROVENTIL HFA;VENTOLIN HFA) 108 (90 Base) MCG/ACT inhaler Inhale 2 puffs into the lungs every 6 (six) hours as needed for wheezing or shortness of breath.    Yes [provider]  citalopram (CELEXA) 20 MG tablet Take 20 mg by mouth daily. 06/23/18  Yes [provider]  DULERA 200-5 MCG/ACT AERO Inhale 1 puff into the lungs 2 (two) times daily.  12/22/17  Yes [provider]  feeding supplement, ENSURE ENLIVE, (ENSURE ENLIVE) LIQD Take 237 mLs by mouth 3 (three) times daily between meals. 12/30/17  Yes Emokpae, Courage, MD  Multiple Vitamins-Minerals (MULTIVITAMIN WITH MINERALS) tablet Take 1 tablet by mouth daily.   Yes  [provider]  tetrahydrozoline 0.05 % ophthalmic solution Place 1 drop into both eyes daily.   Yes [provider]  traZODone (DESYREL) 100 MG tablet Take 1 tablet (100 mg total) by mouth at bedtime. 12/30/17  Yes Roxan Hockey, MD  celecoxib (CELEBREX) 100 MG capsule Take 1 pill every 12 hours as needed for pain Patient not taking: Reported on 08/20/2019 09/18/18   Milton Ferguson, MD  HYDROcodone-acetaminophen (NORCO/VICODIN) 5-325 MG tablet Take 1 tablet by mouth every 4 (four) hours as needed. Patient not taking: Reported on 08/20/2019 08/20/18   Evalee Jefferson, PA-C  pantoprazole sodium (PROTONIX) 40 mg/20 mL PACK Take 20 mLs (40 mg total) by mouth daily. Patient not taking: Reported on 08/20/2019 07/16/18   Patrecia Pour, MD    Family History Family History  Problem Relation Age of Onset   Alzheimer's disease Mother    COPD Mother    Throat cancer Father    Breast cancer Sister     Social History Social History   Tobacco Use   Smoking status: Current Every Day Smoker    Packs/day: 1.50    Years: 20.00    Pack years: 30.00    Types: Cigarettes   Smokeless tobacco:  Never Used  Substance Use Topics   Alcohol use: Not Currently    Alcohol/week: 12.0 standard drinks    Types: 12 Cans of beer per week    Comment: weekly   Drug use: No    Allergies   Penicillins  Review of Systems Review of Systems  Constitutional: Positive for activity change and appetite change.  HENT: Negative.   Respiratory: Negative.   Cardiovascular: Negative.   Gastrointestinal: Positive for abdominal pain, nausea and vomiting. Negative for abdominal distention, anal bleeding, blood in stool, constipation, diarrhea and rectal pain.  Genitourinary: Positive for decreased urine volume. Negative for difficulty urinating, dysuria, enuresis, flank pain, frequency, genital sores, hematuria, menstrual problem, pelvic pain, urgency, vaginal bleeding, vaginal discharge and vaginal  pain.  Musculoskeletal: Negative.   Skin: Negative.   Neurological: Positive for dizziness (Intermittent, none currently) and weakness (Generalized).  Hematological: Bruises/bleeds easily.  All other systems reviewed and are negative.    Physical Exam Updated Vital Signs BP (!) 90/59 (BP Location: Left Arm)    Pulse (!) 54    Temp 97.6 F (36.4 C) (Oral)    Resp 19    Ht 5' 4" (1.626 m)    Wt 31.8 kg    SpO2 99%    BMI 12.02 kg/m   Physical Exam Vitals signs and nursing note reviewed. Exam conducted with a chaperone present.  Constitutional:      General: She is not in acute distress.    Appearance: She is well-developed and underweight. She is ill-appearing. She is not toxic-appearing.     Comments: Cachectic appearing  HENT:     Head: Normocephalic and atraumatic.  Eyes:     Pupils: Pupils are equal, round, and reactive to light.  Neck:     Musculoskeletal: Full passive range of motion without pain and normal range of motion.     Trachea: Trachea and phonation normal.     Comments: No neck stiffness or neck rigidity.  No meningismus. Cardiovascular:     Rate and Rhythm: Bradycardia present.     Pulses: Normal pulses.          Radial pulses are 2+ on the right side and 2+ on the left side.       Dorsalis pedis pulses are 2+ on the right side and 2+ on the left side.       Posterior tibial pulses are 2+ on the right side and 2+ on the left side.     Heart sounds: Normal heart sounds.     Comments: Clear to auscultation bilaterally. Pulmonary:     Effort: Pulmonary effort is normal. No respiratory distress.     Breath sounds: Normal breath sounds and air entry.  Abdominal:     General: Bowel sounds are normal. There is no distension.     Palpations: Abdomen is soft.     Tenderness: There is no abdominal tenderness. There is no right CVA tenderness, left CVA tenderness, guarding or rebound. Negative signs include Murphy's sign.     Hernia: No hernia is present.    Genitourinary:    Comments: Light brown stool in vault.  No gross melena medic easy.  Rectal tone intact. Musculoskeletal: Normal range of motion.     Comments: Moves all 4 extremities without difficulty.  Thin extremities with decreased muscle tone.  Skin:    General: Skin is warm and dry.     Comments: Delayed cap refill.  Small area of erythema to right hip without pressure ulcer.  Neurological:     Mental Status: She is alert.     Comments: Cranial nerves II through XII grossly intact.  No weakness.  Ambulatory without difficulty.    ED Treatments / Results  Labs (all labs ordered are listed, but only abnormal results are displayed) Labs Reviewed  CBC WITH DIFFERENTIAL/PLATELET - Abnormal; Notable for the following components:      Result Value   RDW 20.4 (*)    All other components within normal limits  COMPREHENSIVE METABOLIC PANEL - Abnormal; Notable for the following components:   Sodium 131 (*)    Potassium 2.2 (*)    Chloride 88 (*)    Glucose, Bld 132 (*)    BUN 68 (*)    Creatinine, Ser 1.51 (*)    Total Protein 6.3 (*)    Albumin 2.9 (*)    AST 555 (*)    ALT 737 (*)    GFR calc non Af Amer 36 (*)    GFR calc Af Amer 42 (*)    All other components within normal limits  LIPASE, BLOOD - Abnormal; Notable for the following components:   Lipase 59 (*)    All other components within normal limits  TROPONIN I (HIGH SENSITIVITY) - Abnormal; Notable for the following components:   Troponin I (High Sensitivity) 55 (*)    All other components within normal limits  SARS CORONAVIRUS 2 (HOSPITAL ORDER, [facility])  LACTIC ACID, PLASMA  URINALYSIS, ROUTINE W REFLEX MICROSCOPIC  LACTIC ACID, PLASMA  OCCULT BLOOD X 1 CARD TO LAB, STOOL  POC OCCULT BLOOD, ED  TROPONIN I (HIGH SENSITIVITY)    EKG EKG Interpretation  Date/Time:  Monday August 20 2019 12:55:23 EDT Ventricular Rate:  48 PR Interval:  138 QRS Duration: 130 QT  Interval:  506 QTC Calculation: 452 R Axis:   -148 Text Interpretation:  Sinus bradycardia with Premature atrial complexes Right bundle branch block Abnormal ECG Confirmed by Milton Ferguson [phone]) on 08/20/2019 2:48:00 PM   Radiology Dg Abdomen Acute W/chest  Result Date: 08/20/2019 CLINICAL DATA:  Abdominal pain EXAM: DG ABDOMEN ACUTE W/ 1V CHEST COMPARISON:  Abdomen series December 26, 2017 FINDINGS: PA chest: Lungs are hyperexpanded. There are several scattered small calcified granulomas. There is no edema or consolidation. Heart size and pulmonary vascularity are normal. No adenopathy. There old healed rib fractures bilaterally. There is midthoracic dextroscoliosis. Supine and upright abdomen: There are multiple surgical clips in the abdomen and pelvis. There is moderate stool in the colon. There is no bowel dilatation or air-fluid level to suggest bowel obstruction. No free air. Postoperative changes noted in the proximal right femur. Bones are osteoporotic. IMPRESSION: No bowel obstruction or free air. Moderate stool in colon. Extensive postoperative change in the abdomen and pelvis. Lungs mildly hyperexpanded without edema or consolidation. Scattered calcified granulomas noted. Electronically Signed   By: Lowella Grip III M.D.   On: 08/20/2019 13:23   Procedures .Critical Care Performed by: Nettie Elm, PA-C Authorized by: Nettie Elm, PA-C   Critical care provider statement:    Critical care time (minutes):  61   Critical care was necessary to treat or prevent imminent or life-threatening deterioration of the following conditions:  Circulatory failure (Hypotension and bradycardia requiring Atropine. Elevated Troponin. Severe hypokalemia requiring multiple IV medications.)   Critical care was time spent personally by me on the following activities:  Discussions with consultants, evaluation of patient's response to treatment, examination of patient, ordering and  performing  treatments and interventions, ordering and review of laboratory studies, ordering and review of radiographic studies, pulse oximetry, re-evaluation of patient's condition, obtaining history from patient or surrogate and review of old charts   (including critical care time)  Medications Ordered in ED Medications  potassium chloride 10 mEq in 100 mL IVPB (10 mEq Intravenous New Bag/Given 08/20/19 1516)  sodium chloride 0.9 % bolus 500 mL (0 mLs Intravenous Stopped 08/20/19 1500)  sodium chloride 0.9 % bolus 500 mL (0 mLs Intravenous Stopped 08/20/19 1500)  atropine 1 MG/10ML injection 0.5 mg (0.5 mg Intravenous Given 08/20/19 1331)  acetaminophen (TYLENOL) tablet 650 mg (650 mg Oral Given 08/20/19 1517)  sodium chloride 0.9 % bolus 1,000 mL (1,000 mLs Intravenous New Bag/Given 08/20/19 1516)   Initial Impression / Assessment and Plan / ED Course  I have reviewed the triage vital signs and the nursing notes.  Pertinent labs & imaging results that were available during my care of the patient were reviewed by me and considered in my medical decision making (see chart for details).  63 year old female cachectic appearing presents for generalized weakness.  No focal pain or unilateral weakness.  She has a nonfocal neurologic exam without deficits.  Low suspicion for CVA.  Multiple episodes of emesis almost daily.  She is tolerating very few liquids at home.  Patient arrived hypotensive 80/59 with heart rate in the 40s. Low suspicion for infectious process. High suspicion for metabolic derangement given decreased oral intake. Heart and lungs clear.  CBC without leukocytosis, hemoglobin 53.6 Metabolic panel with hyponatremia at 131, potassium 2.2, getting IV potassium, chloride 88, glucose 132, BUN 68, creatinine 1.51, AST and ALT elevated however alk phos and bilirubin normal.  Her GFR is 36 which is decreased from greater than 60 during her last visit. Occult negative Lactic acid 1.9 COVID  pending Troponin 55, however denies chest pain, shortness of breath.  Question elevation from metabolic derangements. Plain film chest and abdomen without evidence of bowel obstruction, air-fluid levels.  She is constipated.  Chest x-ray without evidence of cardiomegaly, pneumothorax, pulmonary edema. EKG with RBBB, chronic. No STEMI  Patient did require atropine for blood pressure and heart rate management.  Significant improvement to systolic of 144 on reevaluation.  Her heart rate was 66 on reevaluation.  Discussed plan with patient for admission. Which she is agreeable to.  Blood pressure 31V systolic.  Her baseline is 100.  Heart rate 50s-60s.  Her baseline is in the low 60s per previous chart review and patient.  Likely hypotension secondary to hypovolemia.  I will suspicion for infectious process as cause of her hypotension.  1510: Consulted with [facility] who will evaluate patient tomorrow.   1525: Consulted with Dr. Maudie Mercury with [facility] who will evaluate patient for admission.     Patient was seen and evaluated by attending physician, Dr. Roderic Palau who agrees with above treatment, plan and disposition.    The patient appears reasonably stabilized for admission considering the current resources, flow, and capabilities available in the ED at this time, and I doubt any other [facility] requiring further screening and/or treatment in the ED prior to admission. Final Clinical Impressions(s) / ED Diagnoses   Final diagnoses:  Hypotension due to hypovolemia  Bradycardia  Elevated troponin  Hypokalemia  Elevated LFTs  Hyponatremia  Elevated BUN  AKI (acute kidney injury) [facility])    ED Discharge Orders    None       Scott Vanderveer A, PA-C 08/20/19 1532  Milton Ferguson, MD 08/23/19 [phone]

## 2019-08-20 NOTE — ED Notes (Signed)
ekg handed to Dr. Zammit 

## 2019-08-20 NOTE — Progress Notes (Signed)
CRITICAL VALUE ALERT  Critical Value:  2.6  Potassium   Date & Time Notied: now  Provider Notified: Kim  Orders Received/Actions taken: see chart

## 2019-08-20 NOTE — ED Triage Notes (Signed)
Pt presents to ED with chronic back pain x a 1.5 months. Pt states its been getting worse over the last 1-2 months. Pt states she is having trouble laying down and sitting.

## 2019-08-21 ENCOUNTER — Inpatient Hospital Stay (HOSPITAL_COMMUNITY): Payer: Medicare Other

## 2019-08-21 DIAGNOSIS — F1010 Alcohol abuse, uncomplicated: Secondary | ICD-10-CM

## 2019-08-21 DIAGNOSIS — R945 Abnormal results of liver function studies: Secondary | ICD-10-CM

## 2019-08-21 DIAGNOSIS — I340 Nonrheumatic mitral (valve) insufficiency: Secondary | ICD-10-CM

## 2019-08-21 DIAGNOSIS — I959 Hypotension, unspecified: Secondary | ICD-10-CM

## 2019-08-21 DIAGNOSIS — E43 Unspecified severe protein-calorie malnutrition: Secondary | ICD-10-CM

## 2019-08-21 DIAGNOSIS — R112 Nausea with vomiting, unspecified: Secondary | ICD-10-CM

## 2019-08-21 LAB — CBC
HCT: 33.9 % — ABNORMAL LOW (ref 36.0–46.0)
Hemoglobin: 10.8 g/dL — ABNORMAL LOW (ref 12.0–15.0)
MCH: 28.8 pg (ref 26.0–34.0)
MCHC: 31.9 g/dL (ref 30.0–36.0)
MCV: 90.4 fL (ref 80.0–100.0)
Platelets: 256 10*3/uL (ref 150–400)
RBC: 3.75 MIL/uL — ABNORMAL LOW (ref 3.87–5.11)
RDW: 20.5 % — ABNORMAL HIGH (ref 11.5–15.5)
WBC: 3.4 10*3/uL — ABNORMAL LOW (ref 4.0–10.5)
nRBC: 0 % (ref 0.0–0.2)

## 2019-08-21 LAB — COMPREHENSIVE METABOLIC PANEL
ALT: 474 U/L — ABNORMAL HIGH (ref 0–44)
AST: 253 U/L — ABNORMAL HIGH (ref 15–41)
Albumin: 2.2 g/dL — ABNORMAL LOW (ref 3.5–5.0)
Alkaline Phosphatase: 77 U/L (ref 38–126)
Anion gap: 7 (ref 5–15)
BUN: 43 mg/dL — ABNORMAL HIGH (ref 8–23)
CO2: 25 mmol/L (ref 22–32)
Calcium: 8 mg/dL — ABNORMAL LOW (ref 8.9–10.3)
Chloride: 107 mmol/L (ref 98–111)
Creatinine, Ser: 0.73 mg/dL (ref 0.44–1.00)
GFR calc Af Amer: >60 mL/min (ref >60)
GFR calc non Af Amer: >60 mL/min (ref >60)
Glucose, Bld: 87 mg/dL (ref 70–99)
Potassium: 3.8 mmol/L (ref 3.5–5.1)
Sodium: 139 mmol/L (ref 135–145)
Total Bilirubin: 0.8 mg/dL (ref 0.3–1.2)
Total Protein: 5 g/dL — ABNORMAL LOW (ref 6.5–8.1)

## 2019-08-21 LAB — ECHOCARDIOGRAM COMPLETE
Height: 64 in
Weight: 1054.68 oz

## 2019-08-21 LAB — CORTISOL-AM, BLOOD: Cortisol - AM: 15.1 ug/dL (ref 6.7–22.6)

## 2019-08-21 LAB — MRSA PCR SCREENING: MRSA by PCR: NEGATIVE

## 2019-08-21 MED ORDER — OXYCODONE HCL 5 MG PO TABS
5.0000 mg | ORAL_TABLET | ORAL | Status: DC | PRN
  Administered 2019-08-21: 5 mg via ORAL
  Filled 2019-08-21: qty 1

## 2019-08-21 MED ORDER — KCL IN DEXTROSE-NACL 20-5-0.45 MEQ/L-%-% IV SOLN
INTRAVENOUS | Status: DC
  Administered 2019-08-21 – 2019-08-22 (×3): via INTRAVENOUS

## 2019-08-21 MED ORDER — BOOST / RESOURCE BREEZE PO LIQD CUSTOM
1.0000 | Freq: Two times a day (BID) | ORAL | Status: DC
  Administered 2019-08-21 – 2019-08-30 (×7): 1 via ORAL

## 2019-08-21 MED ORDER — ENSURE ENLIVE PO LIQD
237.0000 mL | Freq: Three times a day (TID) | ORAL | Status: DC
  Administered 2019-08-21: 237 mL via ORAL

## 2019-08-21 NOTE — Progress Notes (Signed)
  Echocardiogram 2D Echocardiogram has been performed.  Jannett Celestine 08/21/2019, 12:53 PM

## 2019-08-21 NOTE — Consult Note (Signed)
Referring Provider: Shon Haleourage Emokpae, MD Primary [facility]  [facility]:  Dr. Karilyn Cotaehman  Reason for Consultation:    Chronic nausea and vomiting in a patient with known history of peptic ulcer disease.  HPI:   Patient is 63 year old Caucasian female with complicated history of peptic ulcer disease which is summarized below came to emergency room yesterday morning with worsening back pain which she has had for few months but worse in the last 1 month.  Patient was having difficulty finding a comfortable position.  Patient was noted to be hypotensive.  She reported chronic nausea and vomiting.  Evaluation in emergency room revealed multiple findings.  Patient's serum potassium was 2.2 her BUN was 68 and creatinine 1.51.  AST was 555 ALT of 737.  Serum albumin was 2.9.  Lactic acid was normal.  Acute hepatitis panel was obtained. Acute abdominal series revealed moderate amount of stool in her colon extensive postoperative changes in abdomen and pelvis.  Chest film revealed mildly hyperexpanded lungs scattered calcified granulomas but no pneumonia or infiltrate.  Abdominal pelvic CT revealed and plate compression fractures at L2 1 L3 and L4 and changes of osteoporosis.  Evidence of gastrojejunostomy along with anastomotic staple line.  She also had atherosclerotic changes to the aorta. Patient was admitted to ICU and she was given IV fluids with KCl.  Her stool was guaiac negative. Lab data from this morning revealed H&H of 10.8 and 33.9 with WBC of 3.4. LFTs from this morning revealed AST of 253 and ALT of 474 albumin was 2.2. She underwent echocardiogram this morning and her LV function was normal and no other significant abnormalities were noted.  Patient states she vomits every day.  Vomiting generally occurs after meals.  She may vomit small to large amount of food.  However she denies hematemesis melena or rectal bleeding.  She has postprandial  nausea.  She gets relief when she vomits.  She does not experience abdominal pain.  She states she has not taken [facility] powder for 5 months.  Prior to that she has been using intermittently. Patient states she had [facility] for right [facility].  Postop she kept vomiting.  She underwent esophagogastroduodenoscopy by Dr. Chales AbrahamsGupta on 07/14/2018.  She was found to have grade C reflux esophagitis gastritis and gastroduodenal anastomotic stricture with gastritis and ulceration.  The stricture was dilated to 12 mm.  Patient reports no symptomatic improvement. Patient says she has not taken pantoprazole for months because of cost.  She did not call office for further recommendations.  Patient has history of peptic ulcer disease for about 20 years.  She had possibly 2 gastric surgeries.  She has developed anastomotic ulcer due to continued NSAID use.  She underwent esophageal dilation in March 2017 and redo in June 2017 by me.  Each time there was decrease in vomiting episodes.  She managed to gain weight up to 77 pounds at the time of office visit on 07/06/2017.  Gastroduodenal stricture was redilated in February 2019.  Office visit in March 2019 revealed that she was doing better.  Patient did not show up for scheduled visit in June 2019. She does not drink alcohol.  She smokes a pack and a half of cigarettes per day.  She has been smoking for 38 years.  She is presently not working.  She is not married but in long-term relationship with her boyfriend Carolyne FiscalJeff Smith was at bedside. Father died of throat  cancer at age 2.  Mother had dementia and died at 70.  She has 2 younger sisters in good health.    Past Medical History:  Diagnosis Date  . COPD (chronic obstructive pulmonary disease) ([place])   . Dysphagia   . Esophagitis   . Gastric outlet obstruction   . Gastric stenosis   . GERD (gastroesophageal reflux disease)   . Hiatal hernia   . PUD (peptic ulcer disease)   . Wounds,  multiple     Past Surgical History:  Procedure Laterality Date  . APPENDECTOMY    . BALLOON DILATION N/A 03/05/2016   Procedure: BALLOON DILATION;  Surgeon: Rogene Houston, MD;  [facility];  Service: Endoscopy;  Laterality: N/A;  pyloric channel dialtaion  . BALLOON DILATION N/A 07/14/2018   Procedure: BALLOON DILATION;  Surgeon: Jackquline Denmark, MD;  Location: [facility] ENDOSCOPY;  Service: Endoscopy;  Laterality: N/A;  . BIOPSY  07/14/2018   Procedure: BIOPSY;  Surgeon: Jackquline Denmark, MD;  Location: [facility] ENDOSCOPY;  Service: Endoscopy;;  . CHOLECYSTECTOMY    . ESOPHAGEAL DILATION N/A 03/03/2016   Procedure: ESOPHAGEAL DILATION;  Surgeon: Rogene Houston, MD;  [facility];  Service: Endoscopy;  Laterality: N/A;  . ESOPHAGEAL DILATION N/A 05/07/2016   Procedure: ESOPHAGEAL DILATION;  Surgeon: Rogene Houston, MD;  [facility];  Service: Endoscopy;  Laterality: N/A;  . ESOPHAGEAL DILATION  01/26/2018   Procedure: DILATION OF ANASTOMOTIC  STRICTURE;  Surgeon: Rogene Houston, MD;  [facility];  Service: Endoscopy;;  . ESOPHAGOGASTRODUODENOSCOPY N/A 03/03/2016   Procedure: ESOPHAGOGASTRODUODENOSCOPY (EGD);  Surgeon: Rogene Houston, MD;  [facility];  Service: Endoscopy;  Laterality: N/A;  2:00  . ESOPHAGOGASTRODUODENOSCOPY N/A 05/07/2016   Procedure: ESOPHAGOGASTRODUODENOSCOPY (EGD);  Surgeon: Rogene Houston, MD;  [facility];  Service: Endoscopy;  Laterality: N/A;  855 - moved to 6/2 @ 10:15 - Ann notified pt  . ESOPHAGOGASTRODUODENOSCOPY N/A 12/28/2017   Procedure: ESOPHAGOGASTRODUODENOSCOPY (EGD) with stricture dilation;  Surgeon: Rogene Houston, MD;  [facility];  Service: Endoscopy;  Laterality: N/A;  . ESOPHAGOGASTRODUODENOSCOPY N/A 01/26/2018   Procedure: ESOPHAGOGASTRODUODENOSCOPY (EGD);  Surgeon: Rogene Houston, MD;  [facility];  Service: Endoscopy;  Laterality: N/A;  255  . ESOPHAGOGASTRODUODENOSCOPY  N/A 07/14/2018   Procedure: ESOPHAGOGASTRODUODENOSCOPY (EGD);  Surgeon: Jackquline Denmark, MD;  Location: [facility] ENDOSCOPY;  Service: Endoscopy;  Laterality: N/A;  . ESOPHAGOGASTRODUODENOSCOPY (EGD) WITH PROPOFOL N/A 03/05/2016   Procedure: ESOPHAGOGASTRODUODENOSCOPY (EGD) WITH PROPOFOL Anastomotic stricture dilation ;  Surgeon: Rogene Houston, MD;  [facility];  Service: Endoscopy;  Laterality: N/A;  to be done in OR under fluoro  . INTRAMEDULLARY (IM) NAIL INTERTROCHANTERIC Right 07/09/2018   Procedure: INTRAMEDULLARY (IM) NAIL INTERTROCHANTRIC;  Surgeon: Nicholes Stairs, MD;  Location: [place];  Service: Orthopedics;  Laterality: Right;  . STOMACH SURGERY    . TOTAL HIP ARTHROPLASTY      Prior to Admission medications   Medication Sig Start Date End Date Taking? Authorizing Provider  albuterol (PROVENTIL HFA;VENTOLIN HFA) 108 (90 Base) MCG/ACT inhaler Inhale 2 puffs into the lungs every 6 (six) hours as needed for wheezing or shortness of breath.    Yes [provider]  citalopram (CELEXA) 20 MG tablet Take 20 mg by mouth daily. 06/23/18  Yes [provider]  DULERA 200-5 MCG/ACT AERO Inhale 1 puff into the lungs 2 (two) times daily.  12/22/17  Yes [provider]  feeding supplement, ENSURE ENLIVE, (ENSURE ENLIVE) LIQD Take 237 mLs by mouth 3 (three) times daily between meals. 12/30/17  Yes Emokpae, Courage, MD  Multiple Vitamins-Minerals (MULTIVITAMIN WITH MINERALS) tablet Take 1 tablet by mouth daily.   Yes [provider]  tetrahydrozoline 0.05 % ophthalmic solution Place 1 drop into both eyes daily.   Yes [provider]  traZODone (DESYREL) 100 MG tablet Take 1 tablet (100 mg total) by mouth at bedtime. 12/30/17  Yes Shon Hale, MD  celecoxib (CELEBREX) 100 MG capsule Take 1 pill every 12 hours as needed for pain Patient not taking: Reported on 08/20/2019 09/18/18   Bethann Berkshire, MD  HYDROcodone-acetaminophen (NORCO/VICODIN) 5-325 MG  tablet Take 1 tablet by mouth every 4 (four) hours as needed. Patient not taking: Reported on 08/20/2019 08/20/18   Burgess Amor, PA-C  pantoprazole sodium (PROTONIX) 40 mg/20 mL PACK Take 20 mLs (40 mg total) by mouth daily. Patient not taking: Reported on 08/20/2019 07/16/18   Tyrone Nine, MD    Current Facility-Administered Medications  Medication Dose Route Frequency Provider Last Rate Last Dose  . albuterol (PROVENTIL) (2.5 MG/3ML) 0.083% nebulizer solution 3 mL  3 mL Inhalation Q6H PRN Pearson Grippe, MD      . Chlorhexidine Gluconate Cloth 2 % PADS 6 each  6 each Topical Daily Pearson Grippe, MD   6 each at 08/21/19 0415  . citalopram (CELEXA) tablet 20 mg  20 mg Oral Daily Pearson Grippe, MD   20 mg at 08/21/19 0820  . dextrose 5 % and 0.45 % NaCl with KCl 20 mEq/L infusion   Intravenous Continuous Shon Hale, MD 75 mL/hr at 08/21/19 0920    . enoxaparin (LOVENOX) injection 30 mg  30 mg Subcutaneous Q24H Pearson Grippe, MD   30 mg at 08/20/19 1846  . feeding supplement (ENSURE ENLIVE) (ENSURE ENLIVE) liquid 237 mL  237 mL Oral TID PC Emokpae, Courage, MD      . feeding supplement (PRO-STAT SUGAR FREE 64) liquid 30 mL  30 mL Oral BID Pearson Grippe, MD   30 mL at 08/21/19 0821  . folic acid (FOLVITE) tablet 1 mg  1 mg Oral Daily Pearson Grippe, MD   1 mg at 08/21/19 0820  . LORazepam (ATIVAN) injection 0-4 mg  0-4 mg Intravenous Q4H Pearson Grippe, MD   1 mg at 08/21/19 4383   Followed by  . [START ON 08/22/2019] LORazepam (ATIVAN) injection 0-4 mg  0-4 mg Intravenous Elza Rafter, MD      . LORazepam (ATIVAN) tablet 1-4 mg  1-4 mg Oral Q1H PRN Pearson Grippe, MD       Or  . LORazepam (ATIVAN) injection 1-4 mg  1-4 mg Intravenous Q1H PRN Pearson Grippe, MD      . mometasone-formoterol [facility]) 200-5 MCG/ACT inhaler 1 puff  1 puff Inhalation BID Pearson Grippe, MD   1 puff at 08/21/19 [phone]  . multivitamin with minerals tablet 1 tablet  1 tablet Oral Daily Pearson Grippe, MD   1 tablet at 08/21/19 0820  . naphazoline-glycerin  (CLEAR EYES REDNESS) ophth solution 1 drop  1 drop Both Eyes QID PRN Pearson Grippe, MD      . nicotine (NICODERM CQ - dosed in mg/24 hours) patch 21 mg  21 mg Transdermal Daily Pearson Grippe, MD   21 mg at 08/21/19 0375  . ondansetron (ZOFRAN) injection 4 mg  4 mg Intravenous Q6H PRN Pearson Grippe, MD      . pantoprazole (PROTONIX) injection 40 mg  40 mg Intravenous Delories Heinz, MD   40 mg at 08/21/19 0820  . thiamine (VITAMIN B-1) tablet 100 mg  100 mg Oral Daily Pearson Grippe, MD   100 mg at 08/21/19 4098   Or  . thiamine (B-1) injection 100 mg  100 mg Intravenous Daily Pearson Grippe, MD      . traZODone (DESYREL) tablet 100 mg  100 mg Oral Farrel Demark, MD   100 mg at 08/20/19 2132    Allergies as of 08/20/2019 - Review Complete 08/20/2019  Allergen Reaction Noted  . Penicillins Itching, Swelling, and Other (See Comments) 02/03/2016    Family History  Problem Relation Age of Onset  . Alzheimer's disease Mother   . COPD Mother   . Throat cancer Father   . Breast cancer Sister     Social History   Socioeconomic History  . Marital status: Widowed    Spouse name: Not on file  . Number of children: Not on file  . Years of education: Not on file  . Highest education level: Not on file  Occupational History  . Not on file  Social Needs  . Financial resource strain: Not on file  . Food insecurity    Worry: Not on file    Inability: Not on file  . Transportation needs    Medical: Not on file    Non-medical: Not on file  Tobacco Use  . Smoking status: Current Every Day Smoker    Packs/day: 1.50    Years: 20.00    Pack years: 30.00    Types: Cigarettes  . Smokeless tobacco: Never Used  Substance and Sexual Activity  . Alcohol use: Not Currently    Alcohol/week: 12.0 standard drinks    Types: 12 Cans of beer per week    Comment: weekly  . Drug use: No  . Sexual activity: Yes    Birth control/protection: None  Lifestyle  . Physical activity    Days per week: Not on file     Minutes per session: Not on file  . Stress: Not on file  Relationships  . Social Musician on phone: Not on file    Gets together: Not on file    Attends religious service: Not on file    Active member of club or organization: Not on file    Attends meetings of clubs or organizations: Not on file    Relationship status: Not on file  . Intimate partner violence    Fear of current or ex partner: Not on file    Emotionally abused: Not on file    Physically abused: Not on file    Forced sexual activity: Not on file  Other Topics Concern  . Not on file  Social History Narrative  . Not on file    Review of Systems: See HPI, otherwise normal ROS  Physical Exam: Temp:  [97.4 F (36.3 C)-97.9 F (36.6 C)] 97.4 F (36.3 C) (09/15 0829) Pulse Rate:  [37-82] 48 (09/15 1330) Resp:  [10-19] 12 (09/15 1330) BP: (72-109)/(38-75) 85/52 (09/15 1330) SpO2:  [94 %-100 %] 100 % (09/15 1330) Weight:  [27.8 kg-29.9 kg] 29.9 kg (09/15 1191)   Patient is emaciated.  She prefers to lie in propped up position because of her back. Conjunctiva is pink.  Sclerae nonicteric. Oropharyngeal mucosa is normal.  She is edentulous and uses dentures at mealtime. Neck without masses or thyromegaly. Cardiac exam with regular rhythm normal S1 and S2.  No murmur gallop noted. Auscultation lungs reveal vesicular breath sounds bilaterally.  No rales or rhonchi noted. Abdomen is scaphoid.  It is symmetrical.  She has long right subcostal scar extending to the xiphisternum.  Bowel sounds are normal.  On palpation abdomen is soft and nontender.  She has firmness in epigastrium probably due to scarring. Extremities are very thin with muscle wasting.  No edema noted.   Lab Results: Recent Labs    08/20/19 1249 08/21/19 0504  WBC 4.0 3.4*  HGB 12.7 10.8*  HCT 39.2 33.9*  PLT 318 256   BMET Recent Labs    08/20/19 1249 08/20/19 1450 08/21/19 0504  NA 131* 134* 139  K 2.2* 2.6* 3.8  CL 88* 98  107  CO2 28 25 25   GLUCOSE 132* 135* 87  BUN 68* 58* 43*  CREATININE 1.51* 1.11* 0.73  CALCIUM 9.3 8.0* 8.0*   LFT Recent Labs    08/21/19 0504  PROT 5.0*  ALBUMIN 2.2*  AST 253*  ALT 474*  ALKPHOS 77  BILITOT 0.8    Studies/Results: Ct Abdomen Pelvis Wo Contrast  Result Date: 08/20/2019 CLINICAL DATA:  Back pain. Nausea and vomiting. Weight loss. Abnormal liver function. EXAM: CT ABDOMEN AND PELVIS WITHOUT CONTRAST TECHNIQUE: Multidetector CT imaging of the abdomen and pelvis was performed following the standard protocol without IV contrast. COMPARISON:  Multiple exams, including 07/14/2018 and 10/25/2014 FINDINGS: Lower chest: 5 by 4 mm right lower lobe pulmonary nodule along the diaphragm, image 15/4, no change from 10/25/2014, considered benign. Calcified granuloma in the left lower lobe on image 7/4, benign. Hepatobiliary: Cholecystectomy.  Otherwise unremarkable. Pancreas: Considerable atrophy. Spleen: Unremarkable Adrenals/Urinary Tract: Hypodense 1.8 cm left kidney upper pole lesion, likely a cyst, previously 1.6 cm. Adrenal glands unremarkable. No urinary tract calculi are identified. Stomach/Bowel: Postoperative findings including gastrojejunostomy at not stenotic staple lines in upper pelvic loops of bowel Vascular/Lymphatic: Aortoiliac atherosclerotic vascular disease. No well-defined adenopathy. Reproductive: Uterus is thought to be absent.  Adnexa unremarkable. Other: Severe paucity of adipose tissue favoring cachexia. This makes separation of adjacent abdominal structures such as bowel loops problematic. Musculoskeletal: Right hip screw. Bony demineralization. Interval mild inferior endplate compression fractures at L3 and L4. Interval mild superior endplate compression fracture at L1. Old superior endplate compression fracture L5. IMPRESSION: 1. Interval endplate compression fractures at L1, L3, and L4, compatible with osteoporosis. 2. Prior gastrojejunostomy, along with  anastomotic staple line along what is likely a small bowel loop in the pelvis. The bowel loops are difficult to separate due to the patient's striking paucity of bodily adipose tissue including intra-abdominal adipose tissue. There is formed stool in the colon and I do not see definite dilated bowel. 3. Left kidney upper pole cyst.  No definite urinary tract calculi. 4.  Aortic Atherosclerosis (ICD10-I70.0). Electronically Signed   By: Gaylyn RongWalter  Liebkemann M.D.   On: 08/20/2019 17:18   Dg Abdomen Acute W/chest  Result Date: 08/20/2019 CLINICAL DATA:  Abdominal pain EXAM: DG ABDOMEN ACUTE W/ 1V CHEST COMPARISON:  Abdomen series December 26, 2017 FINDINGS: PA chest: Lungs are hyperexpanded. There are several scattered small calcified granulomas. There is no edema or consolidation. Heart size and pulmonary vascularity are normal. No adenopathy. There old healed rib fractures bilaterally. There is midthoracic dextroscoliosis. Supine and upright abdomen: There are multiple surgical clips in the abdomen and pelvis. There is moderate stool in the colon. There is no bowel dilatation or air-fluid level to suggest bowel obstruction. No free air. Postoperative changes noted  in the proximal right femur. Bones are osteoporotic. IMPRESSION: No bowel obstruction or free air. Moderate stool in colon. Extensive postoperative change in the abdomen and pelvis. Lungs mildly hyperexpanded without edema or consolidation. Scattered calcified granulomas noted. Electronically Signed   By: Bretta Bang III M.D.   On: 08/20/2019 13:23    Assessment;  Patient is 63 year old Caucasian female with complicated history of peptic ulcer disease with 2 prior surgeries who is known to have nonhealing ulcer with gastroduodenal anastomotic stricture who presented with back pain but found to have hypotension profound hypokalemia and elevated transaminases with normal bili and alkaline phosphatase.  She has chronic nausea and vomiting  secondary to poor gastric emptying due to gastroduodenal anastomotic stricture.  She may also have underlying gastroparesis.  She has responded to balloon dilation of stricture in the past.  Surgery will be an option but she is very poor candidate on account of severe malnutrition.  Upper GI tract needs to be reevaluated endoscopically and stricture dilated if appropriate.  Patient needs to be back on her PPI.  Elevated transaminases most likely to ischemic liver injury.  Viral markers are pending.  No structural abnormality noted involving liver on CT.  If transaminases do not return to normal will consider further work-up.  Acute on chronic back pain appears to be due to osteoporotic fractures of L1 L3 and L4.  Patient not a candidate for NSAID therapy.  Narcotic therapy and this situation would be appropriate.  Low body mass index.  Patient's BMI is 11.31.  Unfortunately this has been more or less a chronic situation on account of her GI illness.   Recommendations;  Advance diet to full liquids. Hold enoxaparin. Esophagogastroduodenoscopy with anastomotic stricture dilation on 08/22/2019. Repeat LFTs in a.m.    LOS: 1 day   Najeeb Rehman  08/21/2019, 1:52 PM

## 2019-08-21 NOTE — Progress Notes (Signed)
Patient Demographics:    Kristine DuralMarie Roberts, is a 63 y.o. female, DOB - [birth year], ZOX:[id number]  Admit date - 08/20/2019   Admitting Physician Pearson GrippeJames Kim, MD  Outpatient [facility] the [facility]  LOS - 1   Chief Complaint  Patient presents with   Back Pain        Subjective:    Kristine DuralMarie Roberts today has no fevers, ,  No chest pain, complains of back pain, nausea and vomiting and she is reluctant to take oral intake  Assessment  & Plan :    Principal Problem:   Hypotension Active Problems:   Hypokalemia   Nausea   Abnormal liver function   Hyponatremia  Brief Summary 63 y.o. female,  w h/o Genella RifeGerd, Hiatal hernia, Esophagitis, prior gastrojejunostomy Gastric outlet obstruction, PUD, Copd admitted on 08/20/2019 with dehydration and hypotension as well as elevated LFTs in the setting of vomiting and poor oral intake   A/p 1) hypotension--a.m. cortisol level is 15.1,  -echo with preserved EF over 50%. -Troponins are flat at 55 and 53 - suspect hypotension is secondary to dehydration and poor oral intake, continue IV fluids continue to encourage oral intake  2) elevated LFTs--- AST is down to 253 from 555, ALT is down to 473 from 737--- -bilirubin is not elevated, -Lipase is 59 -Acetaminophen level was 42.. -Acute viral hepatitis panel pending -Discussed with Dr. Karilyn Cotaehman from GI service -CT abdomen and pelvis with status post cholecystectomy otherwise no acute findings from a hepatobiliary standpoint --??  Elevated LFTs due to shock liver in the setting of hypotension -  3) low back pain--patient with endplate compression fractures at L1 L3 and L4 compatible with osteoporosis--- -if back pain is poorly controlled with narcotics consider kyphoplasty  4)FTT/severe protein caloric malnutrition ---- BMI of 11, dietitian consult requested give supplements  5)H/o  Etoh Abuse-lorazepam per CIWA protocol, continue multivitamin including thiamine and folic acid  6) COPD and tobacco abuse--- no acute flareup at this time continue bronchodilators  7) hypovolemic hyponatremia--- continue to hydrate IV n.p.o. avoid excessive free water intake  8) intractable emesis--patient with history of PUD, prior esophagitis and gastric outlet obstruction--- possible EGD on 08/22/2019 -N.p.o. after midnight, discussed with GI physician Dr. Karilyn Cotaehman  Disposition/Need for in-Hospital Stay- patient unable to be discharged at this time due to intractable emesis requiring IV fluids and EGD for evaluation to rule out gastric outlet obstruction  Code Status : Full  Family Communication:   NA (patient is alert, awake and coherent)   Disposition Plan  : TBD  Consults  :  Gi  DVT Prophylaxis  :   - Heparin - SCDs   Lab Results  Component Value Date   PLT 256 08/21/2019    Inpatient Medications  Scheduled Meds:  Chlorhexidine Gluconate Cloth  6 each Topical Daily   citalopram  20 mg Oral Daily   feeding supplement  1 Container Oral BID BM   feeding supplement (ENSURE ENLIVE)  237 mL Oral TID PC   feeding supplement (PRO-STAT SUGAR FREE 64)  30 mL Oral BID   folic acid  1 mg Oral Daily   LORazepam  0-4 mg Intravenous Q4H   Followed by   Melene Muller[START ON  08/22/2019] LORazepam  0-4 mg Intravenous Q8H   mometasone-formoterol  1 puff Inhalation BID   multivitamin with minerals  1 tablet Oral Daily   nicotine  21 mg Transdermal Daily   pantoprazole (PROTONIX) IV  40 mg Intravenous Q12H   thiamine  100 mg Oral Daily   Or   thiamine  100 mg Intravenous Daily   traZODone  100 mg Oral QHS   Continuous Infusions:  dextrose 5 % and 0.45 % NaCl with KCl 20 mEq/L 75 mL/hr at 08/21/19 1500   PRN Meds:.albuterol, LORazepam **OR** LORazepam, naphazoline-glycerin, ondansetron (ZOFRAN) IV    Anti-infectives (From admission, onward)   None        Objective:    Vitals:   08/21/19 1830 08/21/19 1900 08/21/19 1918 08/21/19 2000  BP: (!) 76/53 (!) 73/47  (!) 81/58  Pulse: 60  61 72  Resp: 15 14 12 20   Temp:      TempSrc:      SpO2: 100% 100% 95% 99%  Weight:      Height:        Wt Readings from Last 3 Encounters:  08/21/19 29.9 kg  09/18/18 31.8 kg  08/20/18 31.8 kg     Intake/Output Summary (Last 24 hours) at 08/21/2019 2048 Last data filed at 08/21/2019 1500 Gross per 24 hour  Intake 1258.04 ml  Output 1650 ml  Net -391.96 ml     Physical Exam  Gen:- Awake Alert, very cachectic and emanciated appearing HEENT:- [place].AT, No sclera icterus Neck-Supple Neck,No JVD,.  Lungs-  CTAB , fair symmetrical air movement CV- S1, S2 normal, regular  Abd-  +ve B.Sounds, Abd Soft, No tenderness,    Extremity/Skin:- No  edema, pedal pulses present  Psych-affect is appropriate, oriented x3 Neuro-generalized weakness, No new focal deficits, no tremors   Data Review:   Micro Results Recent Results (from the past 240 hour(s))  SARS Coronavirus 2 [facility](Hospital order, Performed in [facility] lab) Nasopharyngeal Nasopharyngeal Swab     Status: None   Collection Time: 08/20/19  3:25 PM   Specimen: Nasopharyngeal Swab  Result Value Ref Range Status   SARS Coronavirus 2 NEGATIVE NEGATIVE Final    Comment: (NOTE) If result is NEGATIVE SARS-CoV-2 target nucleic acids are NOT DETECTED. The SARS-CoV-2 RNA is generally detectable in upper and lower  respiratory specimens during the acute phase of infection. The lowest  concentration of SARS-CoV-2 viral copies this assay can detect is 250  copies / mL. A negative result does not preclude SARS-CoV-2 infection  and should not be used as the sole basis for treatment or other  patient management decisions.  A negative result may occur with  improper specimen collection / handling, submission of specimen other  than nasopharyngeal swab, presence of viral mutation(s) within the  areas targeted by  this assay, and inadequate number of viral copies  (<250 copies / mL). A negative result must be combined with clinical  observations, patient history, and epidemiological information. If result is POSITIVE SARS-CoV-2 target nucleic acids are DETECTED. The SARS-CoV-2 RNA is generally detectable in upper and lower  respiratory specimens dur ing the acute phase of infection.  Positive  results are indicative of active infection with SARS-CoV-2.  Clinical  correlation with patient history and other diagnostic information is  necessary to determine patient infection status.  Positive results do  not rule out bacterial infection or co-infection with other viruses. If result is PRESUMPTIVE POSTIVE SARS-CoV-2 nucleic acids MAY BE PRESENT.  A presumptive positive result was obtained on the submitted specimen  and confirmed on repeat testing.  While 2019 novel coronavirus  (SARS-CoV-2) nucleic acids may be present in the submitted sample  additional confirmatory testing may be necessary for epidemiological  and / or clinical management purposes  to differentiate between  SARS-CoV-2 and other Sarbecovirus currently known to infect humans.  If clinically indicated additional testing with an alternate test  methodology [phone]) is advised. The SARS-CoV-2 RNA is generally  detectable in upper and lower respiratory sp ecimens during the acute  phase of infection. The expected result is Negative. Fact Sheet for Patients:  BoilerBrush.com.cy Fact Sheet for Healthcare Providers: https://pope.com/ This test is not yet approved or cleared by the [place] FDA and has been authorized for detection and/or diagnosis of SARS-CoV-2 by FDA under an Emergency Use Authorization (EUA).  This EUA will remain in effect (meaning this test can be used) for the duration of the COVID-19 declaration under Section 564(b)(1) of the Act, 21 U.S.C. section  360bbb-3(b)(1), unless the authorization is terminated or revoked sooner. Performed at [facility], [address] [place], [place] [address]   MRSA PCR Screening     Status: None   Collection Time: 08/20/19  6:27 PM   Specimen: Nasal Mucosa; Nasopharyngeal  Result Value Ref Range Status   MRSA by PCR NEGATIVE NEGATIVE Final    Comment:        The GeneXpert MRSA Assay (FDA approved for NASAL specimens only), is one component of a comprehensive MRSA colonization surveillance program. It is not intended to diagnose MRSA infection nor to guide or monitor treatment for MRSA infections. Performed at [facility], [address], [place], [place] [address]     Radiology Reports Ct Abdomen Pelvis Wo Contrast  Result Date: 08/20/2019 CLINICAL DATA:  Back pain. Nausea and vomiting. Weight loss. Abnormal liver function. EXAM: CT ABDOMEN AND PELVIS WITHOUT CONTRAST TECHNIQUE: Multidetector CT imaging of the abdomen and pelvis was performed following the standard protocol without IV contrast. COMPARISON:  Multiple exams, including 07/14/2018 and 10/25/2014 FINDINGS: Lower chest: 5 by 4 mm right lower lobe pulmonary nodule along the diaphragm, image 15/4, no change from 10/25/2014, considered benign. Calcified granuloma in the left lower lobe on image 7/4, benign. Hepatobiliary: Cholecystectomy.  Otherwise unremarkable. Pancreas: Considerable atrophy. Spleen: Unremarkable Adrenals/Urinary Tract: Hypodense 1.8 cm left kidney upper pole lesion, likely a cyst, previously 1.6 cm. Adrenal glands unremarkable. No urinary tract calculi are identified. Stomach/Bowel: Postoperative findings including gastrojejunostomy at not stenotic staple lines in upper pelvic loops of bowel Vascular/Lymphatic: Aortoiliac atherosclerotic vascular disease. No well-defined adenopathy. Reproductive: Uterus is thought to be absent.  Adnexa unremarkable. Other: Severe paucity of adipose tissue favoring cachexia. This makes  separation of adjacent abdominal structures such as bowel loops problematic. Musculoskeletal: Right hip screw. Bony demineralization. Interval mild inferior endplate compression fractures at L3 and L4. Interval mild superior endplate compression fracture at L1. Old superior endplate compression fracture L5. IMPRESSION: 1. Interval endplate compression fractures at L1, L3, and L4, compatible with osteoporosis. 2. Prior gastrojejunostomy, along with anastomotic staple line along what is likely a small bowel loop in the pelvis. The bowel loops are difficult to separate due to the patient's striking paucity of bodily adipose tissue including intra-abdominal adipose tissue. There is formed stool in the colon and I do not see definite dilated bowel. 3. Left kidney upper pole cyst.  No definite urinary tract calculi. 4.  Aortic Atherosclerosis (ICD10-I70.0). Electronically Signed   By:  Van Clines M.D.   On: 08/20/2019 17:18   Dg Abdomen Acute W/chest  Result Date: 08/20/2019 CLINICAL DATA:  Abdominal pain EXAM: DG ABDOMEN ACUTE W/ 1V CHEST COMPARISON:  Abdomen series December 26, 2017 FINDINGS: PA chest: Lungs are hyperexpanded. There are several scattered small calcified granulomas. There is no edema or consolidation. Heart size and pulmonary vascularity are normal. No adenopathy. There old healed rib fractures bilaterally. There is midthoracic dextroscoliosis. Supine and upright abdomen: There are multiple surgical clips in the abdomen and pelvis. There is moderate stool in the colon. There is no bowel dilatation or air-fluid level to suggest bowel obstruction. No free air. Postoperative changes noted in the proximal right femur. Bones are osteoporotic. IMPRESSION: No bowel obstruction or free air. Moderate stool in colon. Extensive postoperative change in the abdomen and pelvis. Lungs mildly hyperexpanded without edema or consolidation. Scattered calcified granulomas noted. Electronically Signed   By: Lowella Grip III M.D.   On: 08/20/2019 13:23     CBC Recent Labs  Lab 08/20/19 1249 08/21/19 0504  WBC 4.0 3.4*  HGB 12.7 10.8*  HCT 39.2 33.9*  PLT 318 256  MCV 87.7 90.4  MCH 28.4 28.8  MCHC 32.4 31.9  RDW 20.4* 20.5*  LYMPHSABS 1.0  --   MONOABS 0.6  --   EOSABS 0.0  --   BASOSABS 0.0  --     Chemistries  Recent Labs  Lab 08/20/19 1249 08/20/19 1450 08/21/19 0504  NA 131* 134* 139  K 2.2* 2.6* 3.8  CL 88* 98 107  CO2 28 25 25   GLUCOSE 132* 135* 87  BUN 68* 58* 43*  CREATININE 1.51* 1.11* 0.73  CALCIUM 9.3 8.0* 8.0*  AST 555* 395* 253*  ALT 737* 576* 474*  ALKPHOS 99 81 77  BILITOT 1.1 1.0 0.8   ------------------------------------------------------------------------------------------------------------------ No results for input(s): CHOL, HDL, LDLCALC, TRIG, CHOLHDL, LDLDIRECT in the last 72 hours.  No results found for: HGBA1C ------------------------------------------------------------------------------------------------------------------ No results for input(s): TSH, T4TOTAL, T3FREE, THYROIDAB in the last 72 hours.  Invalid input(s): FREET3 ------------------------------------------------------------------------------------------------------------------ No results for input(s): VITAMINB12, FOLATE, FERRITIN, TIBC, IRON, RETICCTPCT in the last 72 hours.  Coagulation profile No results for input(s): INR, PROTIME in the last 168 hours.  No results for input(s): DDIMER in the last 72 hours.  Cardiac Enzymes Recent Labs  Lab 08/20/19 1450  [facility] PENDING   ------------------------------------------------------------------------------------------------------------------ No results found for: BNP   Roxan Hockey M.D on 08/21/2019 at 8:48 PM  Go to [email or web address] - for contact info  [facility] - Office  [phone]

## 2019-08-21 NOTE — Progress Notes (Signed)
MD contacted regarding consistently low BP readings. MD made aware that patient reports BP chronically runs in the 58I systolic. Patient's only complaint is of chronic back pain. MD and RN confirmed that IVF infusing per order, but no new orders received at this time. Will continue to monitor BP and report any changes.   Celestia Khat, RN

## 2019-08-22 ENCOUNTER — Inpatient Hospital Stay (HOSPITAL_COMMUNITY): Payer: Medicare Other | Admitting: Anesthesiology

## 2019-08-22 ENCOUNTER — Inpatient Hospital Stay: Payer: Self-pay

## 2019-08-22 ENCOUNTER — Encounter (HOSPITAL_COMMUNITY)
Admission: EM | Disposition: A | Discharge status: Skilled Nursing Facility | Payer: Self-pay | Source: Home / Self Care | Attending: Internal Medicine

## 2019-08-22 ENCOUNTER — Encounter (HOSPITAL_COMMUNITY): Payer: Self-pay | Admitting: *Deleted

## 2019-08-22 DIAGNOSIS — E860 Dehydration: Secondary | ICD-10-CM | POA: Diagnosis present

## 2019-08-22 HISTORY — PX: ESOPHAGOGASTRODUODENOSCOPY (EGD) WITH PROPOFOL: SHX5813

## 2019-08-22 LAB — IRON AND TIBC
Iron: 32 ug/dL (ref 28–170)
Saturation Ratios: 20 % (ref 10.4–31.8)
TIBC: 162 ug/dL — ABNORMAL LOW (ref 250–450)
UIBC: 130 ug/dL

## 2019-08-22 LAB — COMPREHENSIVE METABOLIC PANEL
ALT: 321 U/L — ABNORMAL HIGH (ref 0–44)
AST: 115 U/L — ABNORMAL HIGH (ref 15–41)
Albumin: 2 g/dL — ABNORMAL LOW (ref 3.5–5.0)
Alkaline Phosphatase: 71 U/L (ref 38–126)
Anion gap: 3 — ABNORMAL LOW (ref 5–15)
BUN: 26 mg/dL — ABNORMAL HIGH (ref 8–23)
CO2: 25 mmol/L (ref 22–32)
Calcium: 7.6 mg/dL — ABNORMAL LOW (ref 8.9–10.3)
Chloride: 111 mmol/L (ref 98–111)
Creatinine, Ser: 0.56 mg/dL (ref 0.44–1.00)
GFR calc Af Amer: >60 mL/min (ref >60)
GFR calc non Af Amer: >60 mL/min (ref >60)
Glucose, Bld: 95 mg/dL (ref 70–99)
Potassium: 4.4 mmol/L (ref 3.5–5.1)
Sodium: 139 mmol/L (ref 135–145)
Total Bilirubin: 0.8 mg/dL (ref 0.3–1.2)
Total Protein: 4.3 g/dL — ABNORMAL LOW (ref 6.5–8.1)

## 2019-08-22 LAB — HEPATITIS PANEL, ACUTE
HCV Ab: <0.1 s/co ratio (ref 0.0–0.9)
Hep A IgM: NEGATIVE
Hep B C IgM: NEGATIVE
Hepatitis B Surface Ag: NEGATIVE

## 2019-08-22 LAB — CK TOTAL AND CKMB (NOT AT ARMC)
CK, MB: 4.5 ng/mL (ref 0.5–5.0)
Relative Index: INVALID (ref 0.0–2.5)
Total CK: 35 U/L — ABNORMAL LOW (ref 38–234)

## 2019-08-22 LAB — CBC
HCT: 31.6 % — ABNORMAL LOW (ref 36.0–46.0)
Hemoglobin: 10 g/dL — ABNORMAL LOW (ref 12.0–15.0)
MCH: 29.2 pg (ref 26.0–34.0)
MCHC: 31.6 g/dL (ref 30.0–36.0)
MCV: 92.4 fL (ref 80.0–100.0)
Platelets: 220 10*3/uL (ref 150–400)
RBC: 3.42 MIL/uL — ABNORMAL LOW (ref 3.87–5.11)
RDW: 20.7 % — ABNORMAL HIGH (ref 11.5–15.5)
WBC: 3.6 10*3/uL — ABNORMAL LOW (ref 4.0–10.5)
nRBC: 0 % (ref 0.0–0.2)

## 2019-08-22 LAB — URINALYSIS, ROUTINE W REFLEX MICROSCOPIC
Bacteria, UA: NONE SEEN
Bilirubin Urine: NEGATIVE
Glucose, UA: NEGATIVE mg/dL
Hgb urine dipstick: NEGATIVE
Ketones, ur: NEGATIVE mg/dL
Nitrite: NEGATIVE
Protein, ur: NEGATIVE mg/dL
Specific Gravity, Urine: 1.014 (ref 1.005–1.030)
pH: 6 (ref 5.0–8.0)

## 2019-08-22 LAB — PHOSPHORUS
Phosphorus: 1 mg/dL — CL (ref 2.5–4.6)
Phosphorus: 3.6 mg/dL (ref 2.5–4.6)

## 2019-08-22 LAB — PROTIME-INR
INR: 1 (ref 0.8–1.2)
Prothrombin Time: 12.9 seconds (ref 11.4–15.2)

## 2019-08-22 LAB — RETICULOCYTES
Immature Retic Fract: 22.6 % — ABNORMAL HIGH (ref 2.3–15.9)
RBC.: 2.81 MIL/uL — ABNORMAL LOW (ref 3.87–5.11)
Retic Count, Absolute: 37.9 10*3/uL (ref 19.0–186.0)
Retic Ct Pct: 1.4 % (ref 0.4–3.1)

## 2019-08-22 LAB — FOLATE: Folate: 28.2 ng/mL (ref >5.9)

## 2019-08-22 LAB — FERRITIN: Ferritin: 86 ng/mL (ref 11–307)

## 2019-08-22 LAB — HIV ANTIBODY (ROUTINE TESTING W REFLEX): HIV Screen 4th Generation wRfx: NONREACTIVE

## 2019-08-22 LAB — VITAMIN B12: Vitamin B-12: 3021 pg/mL — ABNORMAL HIGH (ref 180–914)

## 2019-08-22 LAB — MAGNESIUM: Magnesium: 1.8 mg/dL (ref 1.7–2.4)

## 2019-08-22 SURGERY — ESOPHAGOGASTRODUODENOSCOPY (EGD) WITH PROPOFOL
Anesthesia: General

## 2019-08-22 MED ORDER — LIDOCAINE HCL (CARDIAC) PF 100 MG/5ML IV SOSY
PREFILLED_SYRINGE | INTRAVENOUS | Status: DC | PRN
  Administered 2019-08-22: 20 mg via INTRAVENOUS

## 2019-08-22 MED ORDER — KETAMINE HCL 10 MG/ML IJ SOLN
INTRAMUSCULAR | Status: DC | PRN
  Administered 2019-08-22 (×2): 10 mg via INTRAVENOUS

## 2019-08-22 MED ORDER — SODIUM PHOSPHATES 45 MMOLE/15ML IV SOLN
30.0000 mmol | Freq: Once | INTRAVENOUS | Status: AC
  Administered 2019-08-22: 14:00:00 30 mmol via INTRAVENOUS
  Filled 2019-08-22: qty 10

## 2019-08-22 MED ORDER — SODIUM CHLORIDE 0.9% FLUSH
10.0000 mL | Freq: Two times a day (BID) | INTRAVENOUS | Status: DC
  Administered 2019-08-22 – 2019-08-24 (×4): 10 mL
  Administered 2019-08-25: 20 mL
  Administered 2019-08-25: 10 mL
  Administered 2019-08-26: 20 mL
  Administered 2019-08-26 – 2019-08-31 (×10): 10 mL

## 2019-08-22 MED ORDER — LORAZEPAM 2 MG/ML IJ SOLN
0.5000 mg | Freq: Two times a day (BID) | INTRAMUSCULAR | Status: DC | PRN
  Administered 2019-08-22 – 2019-08-30 (×14): 0.5 mg via INTRAVENOUS
  Filled 2019-08-22 (×17): qty 1

## 2019-08-22 MED ORDER — LIDOCAINE 5 % EX PTCH
1.0000 | MEDICATED_PATCH | CUTANEOUS | Status: DC
  Administered 2019-08-22 – 2019-08-31 (×10): 1 via TRANSDERMAL
  Filled 2019-08-22 (×10): qty 1

## 2019-08-22 MED ORDER — STERILE WATER FOR IRRIGATION IR SOLN
Status: DC | PRN
  Administered 2019-08-22: 1.5 mL

## 2019-08-22 MED ORDER — LACTATED RINGERS IV SOLN
Freq: Once | INTRAVENOUS | Status: AC
  Administered 2019-08-22: 1000 mL via INTRAVENOUS

## 2019-08-22 MED ORDER — PROPOFOL 500 MG/50ML IV EMUL
INTRAVENOUS | Status: DC | PRN
  Administered 2019-08-22: 50 ug/kg/min via INTRAVENOUS

## 2019-08-22 MED ORDER — SODIUM CHLORIDE 0.9 % IV SOLN: INTRAVENOUS | Status: DC

## 2019-08-22 MED ORDER — LACTATED RINGERS IV SOLN
INTRAVENOUS | Status: DC | PRN
  Administered 2019-08-22: 11:00:00 via INTRAVENOUS

## 2019-08-22 MED ORDER — PHENOL 1.4 % MT LIQD
1.0000 | OROMUCOSAL | Status: DC | PRN
  Administered 2019-08-22: 20:00:00 1 via OROMUCOSAL
  Filled 2019-08-22: qty 177

## 2019-08-22 MED ORDER — KETAMINE HCL 50 MG/5ML IJ SOSY
PREFILLED_SYRINGE | INTRAMUSCULAR | Status: AC
  Filled 2019-08-22: qty 5

## 2019-08-22 MED ORDER — ALBUMIN HUMAN 25 % IV SOLN
25.0000 g | Freq: Four times a day (QID) | INTRAVENOUS | Status: AC
  Administered 2019-08-22: 12.5 g via INTRAVENOUS
  Administered 2019-08-22 – 2019-08-23 (×3): 25 g via INTRAVENOUS
  Filled 2019-08-22 (×2): qty 100
  Filled 2019-08-22: qty 50
  Filled 2019-08-22: qty 100
  Filled 2019-08-22: qty 50

## 2019-08-22 MED ORDER — SODIUM CHLORIDE 0.9% FLUSH
10.0000 mL | INTRAVENOUS | Status: DC | PRN
  Administered 2019-08-26: 10 mL
  Filled 2019-08-22: qty 40

## 2019-08-22 MED ORDER — DEXTROSE-NACL 5-0.9 % IV SOLN
INTRAVENOUS | Status: DC
  Administered 2019-08-22 – 2019-08-24 (×5): via INTRAVENOUS

## 2019-08-22 NOTE — Progress Notes (Signed)
Pt continues to ask for Ativan, no signs of withdrawal. Educated on reason Ativan is orders. After pt realized she can not have Ativan, she asks for pain medication. Pt educated her Bp is in 13s and pain medication will not be given at this time. Dr. Myna Hidalgo paged and made aware of patients BP. Waiting for orders/call back.

## 2019-08-22 NOTE — Anesthesia Preprocedure Evaluation (Addendum)
Anesthesia Evaluation  Patient identified by MRN, date of birth, ID band Patient awake    Reviewed: Allergy & Precautions, NPO status , Patient's Chart, lab work & pertinent test results  Airway Mallampati: II  TM Distance: >3 FB Neck ROM: Full    Dental  (+) Edentulous Upper, Edentulous Lower   Pulmonary COPD, Current SmokerPatient did not abstain from smoking.,    Pulmonary exam normal breath sounds clear to auscultation       Cardiovascular Exercise Tolerance: Poor Normal cardiovascular exam Rhythm:Regular Rate:Normal  EKG- SB, RBBB   Neuro/Psych PSYCHIATRIC DISORDERS Anxiety    GI/Hepatic hiatal hernia, PUD, GERD  Poorly Controlled,(+)     substance abuse  alcohol use, Gastrojejunostomy    Endo/Other    Renal/GU Creatinine - 1.51 on admission     Musculoskeletal  (+) Arthritis , Osteoarthritis,  Back pain, L1,2,3 compression fx   Abdominal   Peds  Hematology  (+) anemia ,   Anesthesia Other Findings malnutrition  Reproductive/Obstetrics                            Anesthesia Physical Anesthesia Plan  ASA: III  Anesthesia Plan: General   Post-op Pain Management:    Induction: Intravenous  PONV Risk Score and Plan:   Airway Management Planned: Natural Airway, Nasal Cannula and Simple Face Mask  Additional Equipment:   Intra-op Plan:   Post-operative Plan:   Informed Consent: I have reviewed the patients History and Physical, chart, labs and discussed the procedure including the risks, benefits and alternatives for the proposed anesthesia with the patient or authorized representative who has indicated his/her understanding and acceptance.     Dental advisory given  Plan Discussed with: CRNA  Anesthesia Plan Comments: ( risk of aspiration, Possible GA with ETT was explained,)        Anesthesia Quick Evaluation

## 2019-08-22 NOTE — Anesthesia Postprocedure Evaluation (Signed)
Anesthesia Post Note  Patient: Kristine Roberts  Procedure(s) Performed: ESOPHAGOGASTRODUODENOSCOPY (EGD) WITH PROPOFOL With anastomotic stricture dilation. (N/A )  Patient location during evaluation: PACU Anesthesia Type: General Level of consciousness: awake and alert and oriented Pain management: pain level controlled Vital Signs Assessment: post-procedure vital signs reviewed and stable Respiratory status: spontaneous breathing Cardiovascular status: stable Postop Assessment: no apparent nausea or vomiting Anesthetic complications: no     Last Vitals:  Vitals:   08/22/19 1147 08/22/19 1200  BP: (!) 88/56 (!) 88/57  Pulse: (!) 123   Resp: 12 12  Temp: 36.7 C   SpO2: 100% 100%    Last Pain:  Vitals:   08/22/19 1147  TempSrc:   PainSc: 0-No pain                 ADAMS, AMY A

## 2019-08-22 NOTE — Progress Notes (Signed)
CC:  Nausea and Vomiting   Subjective: She denies having any abdominal pain. No nausea or vomiting. She complains of lower back pain. No BM. She remains NPO for EGD dilation later today.    Objective:  Vital signs in last 24 hours: Temp:  [97.5 F (36.4 C)-98.3 F (36.8 C)] 97.5 F (36.4 C) (09/16 0738) Pulse Rate:  [46-87] 54 (09/16 0738) Resp:  [8-24] 12 (09/16 0738) BP: (71-96)/(46-68) 71/52 (09/16 0600) SpO2:  [92 %-100 %] 99 % (09/16 0817) Weight:  [30.1 kg] 30.1 kg (09/16 0500)   General: 63 year old petite underweight female in NAD Mouth: absent dentition, no ulcers or lesions Heart: RRR, no murmur Pulm: Lungs clear throughout Abdomen: Soft, lack of adipose tissue, flat, nontender, + BS x 4 quads, no HSM, RLQ tattoo Extremities:  Without edema. Neurologic:  Alert and  oriented x4;  grossly normal neurologically. Psych:  Alert and cooperative. Normal mood and affect.  Intake/Output from previous day: 09/15 0701 - 09/16 0700 In: 1682.1 [P.O.:240; I.V.:1442.1] Out: 850 [Urine:850] Intake/Output this shift: No intake/output data recorded.  Lab Results: Recent Labs    08/20/19 1249 08/21/19 0504 08/22/19 0503  WBC 4.0 3.4* 3.6*  HGB 12.7 10.8* 10.0*  HCT 39.2 33.9* 31.6*  PLT 318 256 220   BMET Recent Labs    08/20/19 1450 08/21/19 0504 08/22/19 0503  NA 134* 139 139  K 2.6* 3.8 4.4  CL 98 107 111  CO2 _0 GLUCOSE 135* 87 95  BUN 58* 43* 26*  CREATININE 1.11* 0.73 0.56  CALCIUM 8.0* 8.0* 7.6*   LFT Recent Labs    08/22/19 0503  PROT 4.3*  ALBUMIN 2.0*  AST 115*  ALT 321*  ALKPHOS 71  BILITOT 0.8   PT/INR Recent Labs    08/22/19 0503  LABPROT 12.9  INR 1.0   Hepatitis Panel Recent Labs    08/20/19 1450  HEPBSAG Negative  HCVAB <0.1  HEPAIGM Negative  HEPBIGM Negative    Ct Abdomen Pelvis Wo Contrast  Result Date: 08/20/2019 CLINICAL DATA:  Back pain. Nausea and vomiting. Weight loss. Abnormal liver function. EXAM: CT  ABDOMEN AND PELVIS WITHOUT CONTRAST TECHNIQUE: Multidetector CT imaging of the abdomen and pelvis was performed following the standard protocol without IV contrast. COMPARISON:  Multiple exams, including 07/14/2018 and 10/25/2014 FINDINGS: Lower chest: 5 by 4 mm right lower lobe pulmonary nodule along the diaphragm, image 15/4, no change from 10/25/2014, considered benign. Calcified granuloma in the left lower lobe on image 7/4, benign. Hepatobiliary: Cholecystectomy.  Otherwise unremarkable. Pancreas: Considerable atrophy. Spleen: Unremarkable Adrenals/Urinary Tract: Hypodense 1.8 cm left kidney upper pole lesion, likely a cyst, previously 1.6 cm. Adrenal glands unremarkable. No urinary tract calculi are identified. Stomach/Bowel: Postoperative findings including gastrojejunostomy at not stenotic staple lines in upper pelvic loops of bowel Vascular/Lymphatic: Aortoiliac atherosclerotic vascular disease. No well-defined adenopathy. Reproductive: Uterus is thought to be absent.  Adnexa unremarkable. Other: Severe paucity of adipose tissue favoring cachexia. This makes separation of adjacent abdominal structures such as bowel loops problematic. Musculoskeletal: Right hip screw. Bony demineralization. Interval mild inferior endplate compression fractures at L3 and L4. Interval mild superior endplate compression fracture at L1. Old superior endplate compression fracture L5. IMPRESSION: 1. Interval endplate compression fractures at L1, L3, and L4, compatible with osteoporosis. 2. Prior gastrojejunostomy, along with anastomotic staple line along what is likely a small bowel loop in the pelvis. The bowel loops are difficult to separate due to the patient's striking paucity of  bodily adipose tissue including intra-abdominal adipose tissue. There is formed stool in the colon and I do not see definite dilated bowel. 3. Left kidney upper pole cyst.  No definite urinary tract calculi. 4.  Aortic Atherosclerosis (ICD10-I70.0).  Electronically Signed   By: Van Clines M.D.   On: 08/20/2019 17:18   Dg Abdomen Acute W/chest  Result Date: 08/20/2019 CLINICAL DATA:  Abdominal pain EXAM: DG ABDOMEN ACUTE W/ 1V CHEST COMPARISON:  Abdomen series December 26, 2017 FINDINGS: PA chest: Lungs are hyperexpanded. There are several scattered small calcified granulomas. There is no edema or consolidation. Heart size and pulmonary vascularity are normal. No adenopathy. There old healed rib fractures bilaterally. There is midthoracic dextroscoliosis. Supine and upright abdomen: There are multiple surgical clips in the abdomen and pelvis. There is moderate stool in the colon. There is no bowel dilatation or air-fluid level to suggest bowel obstruction. No free air. Postoperative changes noted in the proximal right femur. Bones are osteoporotic. IMPRESSION: No bowel obstruction or free air. Moderate stool in colon. Extensive postoperative change in the abdomen and pelvis. Lungs mildly hyperexpanded without edema or consolidation. Scattered calcified granulomas noted. Electronically Signed   By: Lowella Grip III M.D.   On: 08/20/2019 13:23    Assessment / Plan:  1. Peptic Ulcer Diseae, hx 2 gastric surgeries, hx of an anastomotic ulcer. Hg 10. HCT 31.6. MCV 92.4 -EGD with anastomotic stricture dilation later today with   Dr. Laural Golden if BP stable -NPO -continue IVF d51/2 NS with 29mq KCL100cc/hr  -further recommendations per Dr. RLaural Goldenafter EGD completed  -Continue Protonix 435mIV Q 12 hrs  2. Elevated LFTs secondary to ischemic liver injury. LFTs drifting downward. AST 115. ALT 321. Alk phos 71. T. Bili 0.8 -repeat hepatic panel in am  3. Hypotension. BP 71-93/50's. HR 49-60 b/min. Patient is asymptomatic with hypotension.  -continue to monitor VS closely     LOS: 2 days   CoNoralyn Pick9/16/2020, 9:21 AM

## 2019-08-22 NOTE — Progress Notes (Signed)
ive:  Patient says she is hungry.  She has not vomited in the last day and a half.  She denies chest or abdominal pain.  She also denies shortness of breath.  Objective: Blood pressure (!) 93/47, pulse (!) 53, temperature 98 F (36.7 C), temperature source Oral, resp. rate 16, height 5' 4"  (1.626 m), weight 30.1 kg, SpO2 100 %. Patient is alert and in no acute distress. Patient has severe generalized wasting. Cardiac exam with regular rhythm normal S1 and S2.  No murmur gallop noted. Auscultation lungs reveal vesicular breath sounds bilaterally. Abdomen is flat with with right subcostal scar.  Abdomen is soft and nontender.   Labs/studies Results:  CBC Latest Ref Rng & Units 08/22/2019 08/21/2019 08/20/2019  WBC 4.0 - 10.5 K/uL 3.6(L) 3.4(L) 4.0  Hemoglobin 12.0 - 15.0 g/dL 10.0(L) 10.8(L) 12.7  Hematocrit 36.0 - 46.0 % 31.6(L) 33.9(L) 39.2  Platelets 150 - 400 K/uL 220 256 318    CMP Latest Ref Rng & Units 08/22/2019 08/21/2019 08/20/2019  Glucose 70 - 99 mg/dL 95 87 135(H)  BUN 8 - 23 mg/dL 26(H) 43(H) 58(H)  Creatinine 0.44 - 1.00 mg/dL 0.56 0.73 1.11(H)  Sodium 135 - 145 mmol/L 139 139 134(L)  Potassium 3.5 - 5.1 mmol/L 4.4 3.8 2.6(LL)  Chloride 98 - 111 mmol/L 111 107 98  CO2 22 - 32 mmol/L 25 25 25   Calcium 8.9 - 10.3 mg/dL 7.6(L) 8.0(L) 8.0(L)  Total Protein 6.5 - 8.1 g/dL 4.3(L) 5.0(L) 5.3(L)  Total Bilirubin 0.3 - 1.2 mg/dL 0.8 0.8 1.0  Alkaline Phos 38 - 126 U/L 71 77 81  AST 15 - 41 U/L 115(H) 253(H) 395(H)  ALT 0 - 44 U/L 321(H) 474(H) 576(H)    Hepatic Function Latest Ref Rng & Units 08/22/2019 08/21/2019 08/20/2019  Total Protein 6.5 - 8.1 g/dL 4.3(L) 5.0(L) 5.3(L)  Albumin 3.5 - 5.0 g/dL 2.0(L) 2.2(L) 2.3(L)  AST 15 - 41 U/L 115(H) 253(H) 395(H)  ALT 0 - 44 U/L 321(H) 474(H) 576(H)  Alk Phosphatase 38 - 126 U/L 71 77 81  Total Bilirubin 0.3 - 1.2 mg/dL 0.8 0.8 1.0    Cortisol level 15.1 which is normal  Hepatitis B surface antigen negative Hepatitis C virus  antibody negative Hep a IgM antibody negative Hep B C IgM antibody negative  Assessment:  #1.  Gastric outlet obstruction secondary to chronic/nonhealing gastrojejunal anastomotic ulcer resulting in chronic nausea and vomiting and malnutrition.  #2.  Electrolyte abnormalities.  Potassium and serum creatinine are normal.  #3.  Weighted transaminases most likely due to ischemic liver in the setting of dehydration and hypotension.  Viral markers studies are negative  #4.  Anemia.  H&H is stable.  No evidence of active GI bleed.  Hemoccult on admission was negative.  Recommendations  Seed with esophagogastroduodenoscopy with stricture dilation if indicated. Patient is agreeable.

## 2019-08-22 NOTE — Progress Notes (Signed)
NG tube in place to right nares. Placement verified with air instillation.NG tube secured in place. NG tube connected to LIWS. Draining clear light brown drainage.  Tolerated well.

## 2019-08-22 NOTE — Care Management Important Message (Signed)
Important Message  Patient Details  Name: Kristine Roberts MRN: [id number] Date of Birth: 1956-09-06   Medicare Important Message Given:  Yes     Tommy Medal 08/22/2019, 11:59 AM

## 2019-08-22 NOTE — Transfer of Care (Signed)
Immediate Anesthesia Transfer of Care Note  Patient: Kristine Roberts  Procedure(s) Performed: ESOPHAGOGASTRODUODENOSCOPY (EGD) WITH PROPOFOL With anastomotic stricture dilation. (N/A )  Patient Location: PACU  Anesthesia Type:General  Level of Consciousness: awake, alert , oriented and patient cooperative  Airway & Oxygen Therapy: Patient Spontanous Breathing  Post-op Assessment: Report given to RN and Post -op Vital signs reviewed and stable  Post vital signs: Reviewed and stable  Last Vitals:  Vitals Value Taken Time  BP 88/56 08/22/19 1147  Temp    Pulse 68 08/22/19 1150  Resp 15 08/22/19 1158  SpO2 86 % 08/22/19 1150  Vitals shown include unvalidated device data.  Last Pain:  Vitals:   08/22/19 1058  TempSrc: Oral  PainSc: 8       Patients Stated Pain Goal: 7 (68/03/21 2248)  Complications: No apparent anesthesia complications

## 2019-08-22 NOTE — Progress Notes (Signed)
Patient Demographics:    Kristine Roberts, is a 63 y.o. female, DOB - [birth year], SNK:[id number]  Admit date - 08/20/2019   Admitting Physician Jani Gravel, MD  Outpatient Primary MD for the patient is [facility]  LOS - 2   Chief Complaint  Patient presents with  . Back Pain        Subjective:    Kristine Roberts continues to have nausea, no vomiting..  No shortness of breath  Assessment  & Plan :    Principal Problem:   Hypotension Active Problems:   Hypokalemia   Nausea   Abnormal liver function   Hyponatremia  Brief Summary 63 y.o. female,  w h/o [name], Hiatal hernia, Esophagitis, prior gastrojejunostomy Gastric outlet obstruction, PUD, Copd admitted on 08/20/2019 with dehydration and hypotension as well as elevated LFTs in the setting of vomiting and poor oral intake   A/p 1) hypotension--a.m. cortisol level is 15.1,  -echo with preserved EF over 50%. -Troponins are flat at 55 and 53 - suspect hypotension is secondary to dehydration and poor oral intake, continue IV fluids continue to encourage oral intake -I also suspect that with her extremely low BMI of 11, it is very likely that she normally runs a low blood pressure in the 90s.  Currently, she is on Cardizem will supplement albumin which may help to raise oncotic pressure  2) elevated LFTs--- AST is down to 115 from 555, ALT is down to 321 from 737--- -bilirubin is not elevated, -Lipase is 59 -Acetaminophen level was 42.. -Acute viral hepatitis panel negative -Discussed with Dr. Laural Golden from GI service -CT abdomen and pelvis with status post cholecystectomy otherwise no acute findings from a hepatobiliary standpoint --Elevation of liver enzymes possibly related to dehydration, volume depletion and ischemic injury.  Overall labs are improving  3) low back pain--patient with endplate compression fractures at L1 L3 and  L4 compatible with osteoporosis--- -if back pain is poorly controlled with narcotics consider kyphoplasty  4)FTT/severe protein caloric malnutrition ---- BMI of 11, dietitian consult requested give supplements  5)H/o Etoh Abuse-lorazepam per CIWA protocol, continue multivitamin including thiamine and folic acid.  No signs of withdrawal at this time  6) COPD and tobacco abuse--- no acute flareup at this time continue bronchodilators  7) hypovolemic hyponatremia--- improved with IV hydration  8) intractable emesis--patient with history of PUD, prior esophagitis and gastric outlet obstruction--- possible EGD on 08/22/2019 -N.p.o. after midnight, discussed with GI physician Dr. Laural Golden  9) hypophosphatemia.  Related to diminished p.o. intake.  Replace phosphorus  10) hypokalemia.  Replaced  Magnesium is 1.8.  Disposition/Need for in-Hospital Stay-patient is unable discharge home due to persistence of emesis and vomiting.  She needs further evaluation.  EGD and possible dilatation.  She also has an electrolyte abnormalities that need correction IV replacement prior to discharge.  Code Status : Full  Family Communication:   NA (patient is alert, awake and coherent)   Disposition Plan  : TBD  Consults  :  Gi  DVT Prophylaxis  :   - Heparin - SCDs   Lab Results  Component Value Date   PLT 220 08/22/2019    Inpatient Medications  Scheduled Meds: . Chlorhexidine Gluconate Cloth  6 each Topical Daily  .  citalopram  20 mg Oral Daily  . feeding supplement  1 Container Oral BID BM  . feeding supplement (ENSURE ENLIVE)  237 mL Oral TID PC  . feeding supplement (PRO-STAT SUGAR FREE 64)  30 mL Oral BID  . folic acid  1 mg Oral Daily  . LORazepam  0-4 mg Intravenous Q4H   Followed by  . LORazepam  0-4 mg Intravenous Q8H  . mometasone-formoterol  1 puff Inhalation BID  . multivitamin with minerals  1 tablet Oral Daily  . nicotine  21 mg Transdermal Daily  . pantoprazole (PROTONIX) IV   40 mg Intravenous Q12H  . thiamine  100 mg Oral Daily   Or  . thiamine  100 mg Intravenous Daily  . traZODone  100 mg Oral QHS   Continuous Infusions: . albumin human    . dextrose 5 % and 0.45 % NaCl with KCl 20 mEq/L 100 mL/hr at 08/22/19 0333  . sodium phosphate  Dextrose 5% IVPB     PRN Meds:.albuterol, LORazepam **OR** LORazepam, naphazoline-glycerin, ondansetron (ZOFRAN) IV, oxyCODONE    Anti-infectives (From admission, onward)   None        Objective:   Vitals:   08/22/19 0738 08/22/19 0800 08/22/19 0817 08/22/19 0900  BP:  (!) 70/54  (!) 93/57  Pulse: (!) 54 (!) 49    Resp: 12 11  20   Temp: (!) 97.5 F (36.4 C)     TempSrc: Oral     SpO2: 99% 99% 99% 99%  Weight:      Height:        Wt Readings from Last 3 Encounters:  08/22/19 30.1 kg  09/18/18 31.8 kg  08/20/18 31.8 kg     Intake/Output Summary (Last 24 hours) at 08/22/2019 1003 Last data filed at 08/22/2019 0800 Gross per 24 hour  Intake 2179.94 ml  Output 650 ml  Net 1529.94 ml     Physical Exam  General exam: Alert, awake, oriented x 3, very cachectic Respiratory system: Clear to auscultation. Respiratory effort normal. Cardiovascular system:RRR. No murmurs, rubs, gallops. Gastrointestinal system: Abdomen is nondistended, soft and nontender. No organomegaly or masses felt. Normal bowel sounds heard. Central nervous system: Alert and oriented. No focal neurological deficits. Extremities: No C/C/E, +pedal pulses Skin: No rashes, lesions or ulcers Psychiatry: Judgement and insight appear normal. Mood & affect appropriate.     Data Review:   Micro Results Recent Results (from the past 240 hour(s))  SARS Coronavirus 2 [facility](Hospital order, [facility]) Nasopharyngeal Nasopharyngeal Swab     Status: None   Collection Time: 08/20/19  3:25 PM   Specimen: Nasopharyngeal Swab  Result Value Ref Range Status   SARS Coronavirus 2 NEGATIVE NEGATIVE Final    Comment: (NOTE) If  result is NEGATIVE SARS-CoV-2 target nucleic acids are NOT DETECTED. The SARS-CoV-2 RNA is generally detectable in upper and lower  respiratory specimens during the acute phase of infection. The lowest  concentration of SARS-CoV-2 viral copies this assay can detect is 250  copies / mL. A negative result does not preclude SARS-CoV-2 infection  and should not be used as the sole basis for treatment or other  patient management decisions.  A negative result may occur with  improper specimen collection / handling, submission of specimen other  than nasopharyngeal swab, presence of viral mutation(s) within the  areas targeted by this assay, and inadequate number of viral copies  (<250 copies / mL). A negative result must be combined with clinical  observations, patient history, and epidemiological information. If result is POSITIVE SARS-CoV-2 target nucleic acids are DETECTED. The SARS-CoV-2 RNA is generally detectable in upper and lower  respiratory specimens dur ing the acute phase of infection.  Positive  results are indicative of active infection with SARS-CoV-2.  Clinical  correlation with patient history and other diagnostic information is  necessary to determine patient infection status.  Positive results do  not rule out bacterial infection or co-infection with other viruses. If result is PRESUMPTIVE POSTIVE SARS-CoV-2 nucleic acids MAY BE PRESENT.   A presumptive positive result was obtained on the submitted specimen  and confirmed on repeat testing.  While 2019 novel coronavirus  (SARS-CoV-2) nucleic acids may be present in the submitted sample  additional confirmatory testing may be necessary for epidemiological  and / or clinical management purposes  to differentiate between  SARS-CoV-2 and other Sarbecovirus currently known to infect humans.  If clinically indicated additional testing with an alternate test  methodology [phone]([id number]) is advised. The SARS-CoV-2 RNA is generally   detectable in upper and lower respiratory sp ecimens during the acute  phase of infection. The expected result is Negative. Fact Sheet for Patients:  BoilerBrush.com.cyhttps://www.fda.gov/media/136312/download Fact Sheet for Healthcare Providers: https://pope.com/https://www.fda.gov/media/136313/download This test is not yet approved or cleared by the [place] FDA and has been authorized for detection and/or diagnosis of SARS-CoV-2 by FDA under an Emergency Use Authorization (EUA).  This EUA will remain in effect (meaning this test can be used) for the duration of the COVID-19 declaration under Section 564(b)(1) of the Act, 21 U.S.C. section 360bbb-3(b)(1), unless the authorization is terminated or revoked sooner. Performed at [facility], [address] [place], [place] [address]   MRSA PCR Screening     Status: None   Collection Time: 08/20/19  6:27 PM   Specimen: Nasal Mucosa; Nasopharyngeal  Result Value Ref Range Status   MRSA by PCR NEGATIVE NEGATIVE Final    Comment:        The GeneXpert MRSA Assay (FDA approved for NASAL specimens only), is one component of a comprehensive MRSA colonization surveillance program. It is not intended to diagnose MRSA infection nor to guide or monitor treatment for MRSA infections. Performed at [facility], [address] [place], [place] [address]     Radiology Reports Ct Abdomen Pelvis Wo Contrast  Result Date: 08/20/2019 CLINICAL DATA:  Back pain. Nausea and vomiting. Weight loss. Abnormal liver function. EXAM: CT ABDOMEN AND PELVIS WITHOUT CONTRAST TECHNIQUE: Multidetector CT imaging of the abdomen and pelvis was performed following the standard protocol without IV contrast. COMPARISON:  Multiple exams, including 07/14/2018 and 10/25/2014 FINDINGS: Lower chest: 5 by 4 mm right lower lobe pulmonary nodule along the diaphragm, image 15/4, no change from 10/25/2014, considered benign. Calcified granuloma in the left lower lobe on image 7/4, benign. Hepatobiliary:  Cholecystectomy.  Otherwise unremarkable. Pancreas: Considerable atrophy. Spleen: Unremarkable Adrenals/Urinary Tract: Hypodense 1.8 cm left kidney upper pole lesion, likely a cyst, previously 1.6 cm. Adrenal glands unremarkable. No urinary tract calculi are identified. Stomach/Bowel: Postoperative findings including gastrojejunostomy at not stenotic staple lines in upper pelvic loops of bowel Vascular/Lymphatic: Aortoiliac atherosclerotic vascular disease. No well-defined adenopathy. Reproductive: Uterus is thought to be absent.  Adnexa unremarkable. Other: Severe paucity of adipose tissue favoring cachexia. This makes separation of adjacent abdominal structures such as bowel loops problematic. Musculoskeletal: Right hip screw. Bony demineralization. Interval mild inferior endplate compression fractures at L3 and L4. Interval mild superior endplate compression fracture at L1. Old superior endplate  compression fracture L5. IMPRESSION: 1. Interval endplate compression fractures at L1, L3, and L4, compatible with osteoporosis. 2. Prior gastrojejunostomy, along with anastomotic staple line along what is likely a small bowel loop in the pelvis. The bowel loops are difficult to separate due to the patient's striking paucity of bodily adipose tissue including intra-abdominal adipose tissue. There is formed stool in the colon and I do not see definite dilated bowel. 3. Left kidney upper pole cyst.  No definite urinary tract calculi. 4.  Aortic Atherosclerosis (ICD10-I70.0). Electronically Signed   By: Gaylyn Rong M.D.   On: 08/20/2019 17:18   Dg Abdomen Acute W/chest  Result Date: 08/20/2019 CLINICAL DATA:  Abdominal pain EXAM: DG ABDOMEN ACUTE W/ 1V CHEST COMPARISON:  Abdomen series December 26, 2017 FINDINGS: PA chest: Lungs are hyperexpanded. There are several scattered small calcified granulomas. There is no edema or consolidation. Heart size and pulmonary vascularity are normal. No adenopathy. There old  healed rib fractures bilaterally. There is midthoracic dextroscoliosis. Supine and upright abdomen: There are multiple surgical clips in the abdomen and pelvis. There is moderate stool in the colon. There is no bowel dilatation or air-fluid level to suggest bowel obstruction. No free air. Postoperative changes noted in the proximal right femur. Bones are osteoporotic. IMPRESSION: No bowel obstruction or free air. Moderate stool in colon. Extensive postoperative change in the abdomen and pelvis. Lungs mildly hyperexpanded without edema or consolidation. Scattered calcified granulomas noted. Electronically Signed   By: Bretta Bang III M.D.   On: 08/20/2019 13:23     CBC Recent Labs  Lab 08/20/19 1249 08/21/19 0504 08/22/19 0503  WBC 4.0 3.4* 3.6*  HGB 12.7 10.8* 10.0*  HCT 39.2 33.9* 31.6*  PLT 318 256 220  MCV 87.7 90.4 92.4  MCH 28.4 28.8 29.2  MCHC 32.4 31.9 31.6  RDW 20.4* 20.5* 20.7*  LYMPHSABS 1.0  --   --   MONOABS 0.6  --   --   EOSABS 0.0  --   --   BASOSABS 0.0  --   --     Chemistries  Recent Labs  Lab 08/20/19 1249 08/20/19 1450 08/21/19 0504 08/22/19 0503  NA 131* 134* 139 139  K 2.2* 2.6* 3.8 4.4  CL 88* 98 107 111  CO2 28 25 25 25   GLUCOSE 132* 135* 87 95  BUN 68* 58* 43* 26*  CREATININE 1.51* 1.11* 0.73 0.56  CALCIUM 9.3 8.0* 8.0* 7.6*  MG  --   --   --  1.8  AST 555* 395* 253* 115*  ALT 737* 576* 474* 321*  ALKPHOS 99 81 77 71  BILITOT 1.1 1.0 0.8 0.8   ------------------------------------------------------------------------------------------------------------------ No results for input(s): CHOL, HDL, LDLCALC, TRIG, CHOLHDL, LDLDIRECT in the last 72 hours.  No results found for: HGBA1C ------------------------------------------------------------------------------------------------------------------ No results for input(s): TSH, T4TOTAL, T3FREE, THYROIDAB in the last 72 hours.  Invalid input(s): FREET3  ------------------------------------------------------------------------------------------------------------------ No results for input(s): VITAMINB12, FOLATE, FERRITIN, TIBC, IRON, RETICCTPCT in the last 72 hours.  Coagulation profile Recent Labs  Lab 08/22/19 0503  INR 1.0    No results for input(s): DDIMER in the last 72 hours.  Cardiac Enzymes Recent Labs  Lab 08/20/19 1450  CKMB 4.5   ------------------------------------------------------------------------------------------------------------------ No results found for: BNP   08/22/19 M.D on 08/22/2019 at 10:03 AM  Go to [email or web address] - for contact info  [facility] - Office  [phone]

## 2019-08-22 NOTE — Progress Notes (Signed)
Peripherally Inserted Central Catheter/Midline Placement  The IV Nurse has discussed with the patient and/or persons authorized to consent for the patient, the purpose of this procedure and the potential benefits and risks involved with this procedure.  The benefits include less needle sticks, lab draws from the catheter, and the patient may be discharged home with the catheter. Risks include, but not limited to, infection, bleeding, blood clot (thrombus formation), and puncture of an artery; nerve damage and irregular heartbeat and possibility to perform a PICC exchange if needed/ordered by physician.  Alternatives to this procedure were also discussed.  Bard Power PICC patient education guide, fact sheet on infection prevention and patient information card has been provided to patient /or left at bedside.    PICC/Midline Placement Documentation  PICC Double Lumen 08/22/19 PICC Right Brachial 34 cm 0 cm (Active)  Indication for Insertion or Continuance of Line Administration of hyperosmolar/irritating solutions (i.e. TPN, Vancomycin, etc.) 08/22/19 1600  Exposed Catheter (cm) 0 cm 08/22/19 1600  Site Assessment Clean;Dry;Intact 08/22/19 1600  Lumen #1 Status Flushed;Saline locked;Blood return noted 08/22/19 1600  Lumen #2 Status Flushed;Saline locked;Blood return noted 08/22/19 1600  Dressing Type Transparent;Securing device 08/22/19 1600  Dressing Status Clean;Dry;Intact;Antimicrobial disc in place 08/22/19 1600  Dressing Change Due 08/29/19 08/22/19 1600       Holley Bouche Ages 08/22/2019, 4:54 PM

## 2019-08-22 NOTE — Progress Notes (Signed)
Brief EGD note  Grade C reflux esophagitis involving distal 6 cm of esophageal mucosa. Small sliding hiatal hernia. Large gastric remnant with moderate amount of food debris. Gastrojejunal anastomotic circumferential ulceration with stricture.  Unable to advance scope distally. NG tube to be placed for decompression.

## 2019-08-22 NOTE — Op Note (Signed)
[facility] Patient Name: Kristine Roberts Procedure Date: 08/22/2019 11:03 AM MRN: [id number] Date of Birth: 27-Jun-1956 Attending MD: Lionel DecemberNajeeb Montrelle Eddings , MD CSN: [id number] Age: [age] Admit Type: Inpatient Procedure:                Upper GI endoscopy Indications:              Follow-up of post-surgical anastomotic stenosis,                            For therapy of post-surgical anastomotic stenosis,                            Nausea with vomiting Providers:                Lionel DecemberNajeeb Anayelli Lai, MD, Sterling Bigiffani Roberts, RN Referring MD:             Shon Haleourage Emokpae, Md Medicines:                Propofol per Anesthesia Complications:            No immediate complications. Estimated Blood Loss:     Estimated blood loss: none. Procedure:                Pre-Anesthesia Assessment:                           - Prior to the procedure, a History and Physical                            was performed, and patient medications and                            allergies were reviewed. The patient's tolerance of                            previous anesthesia was also reviewed. The risks                            and benefits of the procedure and the sedation                            options and risks were discussed with the patient.                            All questions were answered, and informed consent                            was obtained. Prior Anticoagulants: The patient                            last took Lovenox (enoxaparin) 1 day prior to the                            procedure. ASA Grade Assessment: IV - A patient  with severe systemic disease that is a constant                            threat to life. After reviewing the risks and                            benefits, the patient was deemed in satisfactory                            condition to undergo the procedure.                           After obtaining informed consent, the endoscope was     passed under direct vision. Throughout the                            procedure, the patient's blood pressure, pulse, and                            oxygen saturations were monitored continuously. The                            GIF-H190 ([id number]) scope was introduced through the                            mouth, and advanced to the body of the stomach. The                            upper GI endoscopy was accomplished without                            difficulty. The patient tolerated the procedure                            well. Scope In: 11:30:59 AM Scope Out: 11:37:28 AM Total Procedure Duration: 0 hours 6 minutes 29 seconds  Findings:      The proximal esophagus and mid esophagus were normal.      LA Grade C (one or more mucosal breaks continuous between tops of 2 or       more mucosal folds, less than 75% circumference) esophagitis with no       bleeding was found 29 to 35 cm from the incisors.      A 2 cm hiatal hernia was present.      A medium amount of food (residue) was found in the gastric body.      One non-bleeding cratered gastric ulcer with no stigmata of bleeding was       found at the anastomosis. Scope could not be passed across it.      The gastric fundus was normal. Impression:               - Normal proximal esophagus and mid esophagus.                           - LA Grade C esophagitis.                           -  2 cm hiatal hernia.                           - A medium amount of food (residue) in the stomach.                           - Non-bleeding ulcer with stricture at                            gastroduodenal anastamosis with no stigmata of                            bleeding. Scope could not be advanced into jejunum.                           - Normal gastric fundus.                           - No specimens collected. Moderate Sedation:      Per Anesthesia Care Recommendation:           - Return patient to ICU for ongoing care.                            - Continue present medications.                           - NG suction.                           - NPO except ice chips.                           - Consider parenteral nutrition.                           - Repeat upper endoscopy in 2 days. Procedure Code(s):        --- Professional ---                           [phone], 52, Esophagogastroduodenoscopy, flexible,                            transoral; diagnostic, including collection of                            specimen(s) by brushing or washing, when performed                            (separate procedure) Diagnosis Code(s):        --- Professional ---                           K20.9, Esophagitis, unspecified                           K44.9, Diaphragmatic hernia without obstruction or  gangrene                           K25.9, Gastric ulcer, unspecified as acute or                            chronic, without hemorrhage or perforation                           K91.89, Other postprocedural complications and                            disorders of digestive system                           R11.2, Nausea with vomiting, unspecified CPT copyright 2019 American Medical Association. All rights reserved. The codes documented in this report are preliminary and upon coder review may  be revised to meet current compliance requirements. Lionel December, MD Lionel December, MD 08/22/2019 11:59:34 AM This report has been signed electronically. Number of Addenda: 0

## 2019-08-22 NOTE — Progress Notes (Signed)
CRITICAL VALUE ALERT  Critical Value:  Phosphorus 1.0  Date & Time Notied:  08/22/19 0955  Provider Notified: MD Memon  Orders Received/Actions taken: Will replace phosphorus

## 2019-08-23 DIAGNOSIS — K210 Gastro-esophageal reflux disease with esophagitis: Secondary | ICD-10-CM

## 2019-08-23 DIAGNOSIS — K209 Esophagitis, unspecified: Secondary | ICD-10-CM

## 2019-08-23 DIAGNOSIS — K259 Gastric ulcer, unspecified as acute or chronic, without hemorrhage or perforation: Secondary | ICD-10-CM

## 2019-08-23 DIAGNOSIS — K9189 Other postprocedural complications and disorders of digestive system: Secondary | ICD-10-CM

## 2019-08-23 LAB — COMPREHENSIVE METABOLIC PANEL
ALT: 192 U/L — ABNORMAL HIGH (ref 0–44)
AST: 61 U/L — ABNORMAL HIGH (ref 15–41)
Albumin: 3.3 g/dL — ABNORMAL LOW (ref 3.5–5.0)
Alkaline Phosphatase: 52 U/L (ref 38–126)
Anion gap: 6 (ref 5–15)
BUN: 12 mg/dL (ref 8–23)
CO2: 23 mmol/L (ref 22–32)
Calcium: 7 mg/dL — ABNORMAL LOW (ref 8.9–10.3)
Chloride: 111 mmol/L (ref 98–111)
Creatinine, Ser: 0.43 mg/dL — ABNORMAL LOW (ref 0.44–1.00)
GFR calc Af Amer: >60 mL/min (ref >60)
GFR calc non Af Amer: >60 mL/min (ref >60)
Glucose, Bld: 98 mg/dL (ref 70–99)
Potassium: 3.4 mmol/L — ABNORMAL LOW (ref 3.5–5.1)
Sodium: 140 mmol/L (ref 135–145)
Total Bilirubin: 1 mg/dL (ref 0.3–1.2)
Total Protein: 4.9 g/dL — ABNORMAL LOW (ref 6.5–8.1)

## 2019-08-23 LAB — CBC
HCT: 27 % — ABNORMAL LOW (ref 36.0–46.0)
Hemoglobin: 8.5 g/dL — ABNORMAL LOW (ref 12.0–15.0)
MCH: 29.4 pg (ref 26.0–34.0)
MCHC: 31.5 g/dL (ref 30.0–36.0)
MCV: 93.4 fL (ref 80.0–100.0)
Platelets: 175 10*3/uL (ref 150–400)
RBC: 2.89 MIL/uL — ABNORMAL LOW (ref 3.87–5.11)
RDW: 21.1 % — ABNORMAL HIGH (ref 11.5–15.5)
WBC: 6 10*3/uL (ref 4.0–10.5)
nRBC: 0 % (ref 0.0–0.2)

## 2019-08-23 LAB — PHOSPHORUS: Phosphorus: 1.8 mg/dL — ABNORMAL LOW (ref 2.5–4.6)

## 2019-08-23 MED ORDER — SODIUM PHOSPHATES 45 MMOLE/15ML IV SOLN
20.0000 mmol | Freq: Once | INTRAVENOUS | Status: DC
  Filled 2019-08-23: qty 6.67

## 2019-08-23 MED ORDER — ORAL CARE MOUTH RINSE
15.0000 mL | Freq: Two times a day (BID) | OROMUCOSAL | Status: DC
  Administered 2019-08-23 – 2019-08-28 (×10): 15 mL via OROMUCOSAL

## 2019-08-23 MED ORDER — POTASSIUM CHLORIDE 10 MEQ/100ML IV SOLN
10.0000 meq | INTRAVENOUS | Status: AC
  Administered 2019-08-23 (×2): 10 meq via INTRAVENOUS
  Filled 2019-08-23 (×2): qty 100

## 2019-08-23 NOTE — Progress Notes (Signed)
  Subjective:  Patient denies nausea or vomiting.  She also denies abdominal pain.  She is wondering why she is not getting parenteral nutrition.  She is hoping that NG tube will be removed tomorrow and that he can start eating.   Objective: Blood pressure 97/70, pulse 68, temperature 99.5 F (37.5 C), temperature source Oral, resp. rate 13, height 5' 4" (1.626 m), weight 32.6 kg, SpO2 97 %. Patient is alert and in no acute distress. NG tube is in place.  It has some food debris in it. Abdomen is scaphoid soft and nontender.  NG output ?  Labs/studies Results:  CBC Latest Ref Rng & Units 08/23/2019 08/22/2019 08/21/2019  WBC 4.0 - 10.5 K/uL 6.0 3.6(L) 3.4(L)  Hemoglobin 12.0 - 15.0 g/dL 8.5(L) 10.0(L) 10.8(L)  Hematocrit 36.0 - 46.0 % 27.0(L) 31.6(L) 33.9(L)  Platelets 150 - 400 K/uL 175 220 256    CMP Latest Ref Rng & Units 08/23/2019 08/22/2019 08/21/2019  Glucose 70 - 99 mg/dL 98 95 87  BUN 8 - 23 mg/dL 12 26(H) 43(H)  Creatinine 0.44 - 1.00 mg/dL 0.43(L) 0.56 0.73  Sodium 135 - 145 mmol/L 140 139 139  Potassium 3.5 - 5.1 mmol/L 3.4(L) 4.4 3.8  Chloride 98 - 111 mmol/L 111 111 107  CO2 22 - 32 mmol/L _0 Calcium 8.9 - 10.3 mg/dL 7.0(L) 7.6(L) 8.0(L)  Total Protein 6.5 - 8.1 g/dL 4.9(L) 4.3(L) 5.0(L)  Total Bilirubin 0.3 - 1.2 mg/dL 1.0 0.8 0.8  Alkaline Phos 38 - 126 U/L 52 71 77  AST 15 - 41 U/L 61(H) 115(H) 253(H)  ALT 0 - 44 U/L 192(H) 321(H) 474(H)    Hepatic Function Latest Ref Rng & Units 08/23/2019 08/22/2019 08/21/2019  Total Protein 6.5 - 8.1 g/dL 4.9(L) 4.3(L) 5.0(L)  Albumin 3.5 - 5.0 g/dL 3.3(L) 2.0(L) 2.2(L)  AST 15 - 41 U/L 61(H) 115(H) 253(H)  ALT 0 - 44 U/L 192(H) 321(H) 474(H)  Alk Phosphatase 38 - 126 U/L 52 71 77  Total Bilirubin 0.3 - 1.2 mg/dL 1.0 0.8 0.8     Assessment:  #1.  Gastric outlet obstruction secondary to circumferential gastrojejunal anastomotic ulcer with stenosis.  EGD was attempted yesterday but the stricture could not be traversed.   Since patient had moderate amount of food debris in the stomach it was decided not to proceed any further.  Nausea and vomiting has resolved with NG decompression.  Patient will undergo repeat EGD with therapeutic intention on 08/24/2019. Patient is on pantoprazole 40 mg IV every 12 hours.  #2.  Grade C reflux esophagitis secondary to gastric outlet obstruction.  Patient is on IV PPI.  #3.  Severe malnutrition secondary to #1.  BMI very low.  Patient had PICC line placed yesterday and to start parenteral nutrition today.  #4.  Elevated transaminases with significant improvement over the last 48 hours.  Etiology felt to be ischemic injury in setting of hypotension and dehydration.  Viral markers studies for hep A, B, and C are negative.  No abnormality noted involving liver on CT that she had at the time of admission.  Recommendations  Esophagogastroduodenoscopy on 08/24/2019 with balloon dilation of anastomotic stricture. NG tube will be removed prior to endoscopic intervention.

## 2019-08-23 NOTE — Progress Notes (Signed)
Patient Demographics:    Kristine Roberts, is a 63 y.o. female, DOB - June 21, 1956, EHM:[id number]  Admit date - 08/20/2019   Admitting Physician Pearson Grippe, MD  Outpatient Primary MD for the patient is [facility]  LOS - 3   Chief Complaint  Patient presents with  . Back Pain        Subjective:    Kristine Roberts continues to have nausea, no vomiting..  No shortness of breath  Assessment  & Plan :    Principal Problem:   Hypotension Active Problems:   COPD (chronic obstructive pulmonary disease) (HCC)   Protein-calorie malnutrition, severe   Hypokalemia   Cachexia (HCC)   ETOH abuse   Nausea   Abnormal liver function   Hyponatremia   Dehydration  Brief Summary 63 y.o. female,  w h/o [name], Hiatal hernia, Esophagitis, prior gastrojejunostomy Gastric outlet obstruction, PUD, Copd admitted on 08/20/2019 with dehydration and hypotension as well as elevated LFTs in the setting of vomiting and poor oral intake   A/p 1) hypotension--a.m. cortisol level is 15.1,  -echo with preserved EF over 50%. -Troponins are flat at 55 and 53 - suspect hypotension is secondary to dehydration and poor oral intake, continue IV fluids continue to encourage oral intake -I also suspect that with her extremely low BMI of 11, it is very likely that she normally runs a low blood pressure in the 90s.  Overall, blood pressures have improved since admission  2) elevated LFTs--- AST is down to 115 from 555, ALT is down to 321 from 737--- -bilirubin is not elevated, -Lipase is 59 -Acetaminophen level was 42.. -Acute viral hepatitis panel negative -Discussed with Dr. Karilyn Cota from GI service -CT abdomen and pelvis with status post cholecystectomy otherwise no acute findings from a hepatobiliary standpoint --Elevation of liver enzymes possibly related to dehydration, volume depletion and ischemic injury.   Overall labs are improving  3) low back pain--patient with endplate compression fractures at L1 L3 and L4 compatible with osteoporosis--- -if back pain is poorly controlled with narcotics consider kyphoplasty  4)FTT/severe protein caloric malnutrition ---- BMI of 11, dietitian consult requested give supplements.  TPN ordered on 9/17  5)H/o Etoh Abuse-lorazepam per CIWA protocol, continue multivitamin including thiamine and folic acid.  No signs of withdrawal at this time  6) COPD and tobacco abuse--- no acute flareup at this time continue bronchodilators  7) hypovolemic hyponatremia--- improved with IV hydration  8) intractable emesis--patient with history of PUD, prior esophagitis and gastric outlet obstruction--- patient underwent EGD on 9/16 which noted gastric outlet obstruction secondary to circumferential gastrojejunal anastomotic ulcer with stenosis.  NG tube was placed for decompression for repeat EGD 9/18.  PICC line has been placed.  TPN ordered on 9/17.   9) hypophosphatemia.  Related to diminished p.o. intake.  Replacing phosphorus  10) hypokalemia.  Replaced  Magnesium is 1.8.  Disposition/Need for in-Hospital Stay-patient is unable discharge home due to persistence of emesis and vomiting.  She needs further evaluation.  EGD and possible dilatation.  She also has an electrolyte abnormalities that need correction IV replacement prior to discharge.  Code Status : Full  Family Communication:   NA (patient is alert, awake and coherent)   Disposition Plan  :  TBD  Consults  :  Gi  DVT Prophylaxis  :   - Heparin - SCDs   Lab Results  Component Value Date   PLT 175 08/23/2019    Inpatient Medications  Scheduled Meds: . Chlorhexidine Gluconate Cloth  6 each Topical Daily  . feeding supplement  1 Container Oral BID BM  . feeding supplement (ENSURE ENLIVE)  237 mL Oral TID PC  . feeding supplement (PRO-STAT SUGAR FREE 64)  30 mL Oral BID  . folic acid  1 mg Oral Daily   . lidocaine  1 patch Transdermal Q24H  . LORazepam  0-4 mg Intravenous Q8H  . mometasone-formoterol  1 puff Inhalation BID  . nicotine  21 mg Transdermal Daily  . pantoprazole (PROTONIX) IV  40 mg Intravenous Q12H  . sodium chloride flush  10-40 mL Intracatheter Q12H  . thiamine  100 mg Oral Daily   Or  . thiamine  100 mg Intravenous Daily   Continuous Infusions: . dextrose 5 % and 0.9% NaCl 100 mL/hr at 08/23/19 1836  . potassium chloride    . sodium phosphate  Dextrose 5% IVPB     PRN Meds:.albuterol, LORazepam, naphazoline-glycerin, ondansetron (ZOFRAN) IV, phenol, sodium chloride flush    Anti-infectives (From admission, onward)   None        Objective:   Vitals:   08/23/19 1610 08/23/19 1700 08/23/19 1830 08/23/19 1835  BP:  107/62  113/71  Pulse: 79 75  87  Resp: 16 14  17   Temp: 99 F (37.2 C)     TempSrc: Oral     SpO2: 96% 96% 95%   Weight:      Height:        Wt Readings from Last 3 Encounters:  08/23/19 32.6 kg  09/18/18 31.8 kg  08/20/18 31.8 kg     Intake/Output Summary (Last 24 hours) at 08/23/2019 2028 Last data filed at 08/23/2019 1749 Gross per 24 hour  Intake 1067.62 ml  Output 1375 ml  Net -307.38 ml     Physical Exam  General exam: Alert, awake, oriented x 3, NG tube in place Respiratory system: Clear to auscultation. Respiratory effort normal. Cardiovascular system:RRR. No murmurs, rubs, gallops. Gastrointestinal system: Abdomen is nondistended, soft and nontender. No organomegaly or masses felt. Normal bowel sounds heard. Central nervous system: Alert and oriented. No focal neurological deficits. Extremities: No C/C/E, +pedal pulses Skin: No rashes, lesions or ulcers Psychiatry: Judgement and insight appear normal. Mood & affect appropriate.      Data Review:   Micro Results Recent Results (from the past 240 hour(s))  SARS Coronavirus 2 [facility] order, Performed in [facility] hospital lab) Nasopharyngeal Nasopharyngeal  Swab     Status: None   Collection Time: 08/20/19  3:25 PM   Specimen: Nasopharyngeal Swab  Result Value Ref Range Status   SARS Coronavirus 2 NEGATIVE NEGATIVE Final    Comment: (NOTE) If result is NEGATIVE SARS-CoV-2 target nucleic acids are NOT DETECTED. The SARS-CoV-2 RNA is generally detectable in upper and lower  respiratory specimens during the acute phase of infection. The lowest  concentration of SARS-CoV-2 viral copies this assay can detect is 250  copies / mL. A negative result does not preclude SARS-CoV-2 infection  and should not be used as the sole basis for treatment or other  patient management decisions.  A negative result may occur with  improper specimen collection / handling, submission of specimen other  than nasopharyngeal swab, presence of viral mutation(s) within the  areas targeted by this assay, and inadequate number of viral copies  (<250 copies / mL). A negative result must be combined with clinical  observations, patient history, and epidemiological information. If result is POSITIVE SARS-CoV-2 target nucleic acids are DETECTED. The SARS-CoV-2 RNA is generally detectable in upper and lower  respiratory specimens dur ing the acute phase of infection.  Positive  results are indicative of active infection with SARS-CoV-2.  Clinical  correlation with patient history and other diagnostic information is  necessary to determine patient infection status.  Positive results do  not rule out bacterial infection or co-infection with other viruses. If result is PRESUMPTIVE POSTIVE SARS-CoV-2 nucleic acids MAY BE PRESENT.   A presumptive positive result was obtained on the submitted specimen  and confirmed on repeat testing.  While 2019 novel coronavirus  (SARS-CoV-2) nucleic acids may be present in the submitted sample  additional confirmatory testing may be necessary for epidemiological  and / or clinical management purposes  to differentiate between  SARS-CoV-2  and other Sarbecovirus currently known to infect humans.  If clinically indicated additional testing with an alternate test  methodology [phone]([id number]) is advised. The SARS-CoV-2 RNA is generally  detectable in upper and lower respiratory sp ecimens during the acute  phase of infection. The expected result is Negative. Fact Sheet for Patients:  BoilerBrush.com.cyhttps://www.fda.gov/media/136312/download Fact Sheet for Healthcare Providers: https://pope.com/https://www.fda.gov/media/136313/download This test is not yet approved or cleared by the [place] FDA and has been authorized for detection and/or diagnosis of SARS-CoV-2 by FDA under an Emergency Use Authorization (EUA).  This EUA will remain in effect (meaning this test can be used) for the duration of the COVID-19 declaration under Section 564(b)(1) of the Act, 21 U.S.C. section 360bbb-3(b)(1), unless the authorization is terminated or revoked sooner. Performed at [facility], [address] [place], [place] [address]   MRSA PCR Screening     Status: None   Collection Time: 08/20/19  6:27 PM   Specimen: Nasal Mucosa; Nasopharyngeal  Result Value Ref Range Status   MRSA by PCR NEGATIVE NEGATIVE Final    Comment:        The GeneXpert MRSA Assay (FDA approved for NASAL specimens only), is one component of a comprehensive MRSA colonization surveillance program. It is not intended to diagnose MRSA infection nor to guide or monitor treatment for MRSA infections. Performed at [facility], [address] [place], [place] [address]     Radiology Reports Ct Abdomen Pelvis Wo Contrast  Result Date: 08/20/2019 CLINICAL DATA:  Back pain. Nausea and vomiting. Weight loss. Abnormal liver function. EXAM: CT ABDOMEN AND PELVIS WITHOUT CONTRAST TECHNIQUE: Multidetector CT imaging of the abdomen and pelvis was performed following the standard protocol without IV contrast. COMPARISON:  Multiple exams, including 07/14/2018 and 10/25/2014 FINDINGS: Lower chest: 5 by 4 mm  right lower lobe pulmonary nodule along the diaphragm, image 15/4, no change from 10/25/2014, considered benign. Calcified granuloma in the left lower lobe on image 7/4, benign. Hepatobiliary: Cholecystectomy.  Otherwise unremarkable. Pancreas: Considerable atrophy. Spleen: Unremarkable Adrenals/Urinary Tract: Hypodense 1.8 cm left kidney upper pole lesion, likely a cyst, previously 1.6 cm. Adrenal glands unremarkable. No urinary tract calculi are identified. Stomach/Bowel: Postoperative findings including gastrojejunostomy at not stenotic staple lines in upper pelvic loops of bowel Vascular/Lymphatic: Aortoiliac atherosclerotic vascular disease. No well-defined adenopathy. Reproductive: Uterus is thought to be absent.  Adnexa unremarkable. Other: Severe paucity of adipose tissue favoring cachexia. This makes separation of adjacent abdominal structures such as bowel loops problematic. Musculoskeletal: Right  hip screw. Bony demineralization. Interval mild inferior endplate compression fractures at L3 and L4. Interval mild superior endplate compression fracture at L1. Old superior endplate compression fracture L5. IMPRESSION: 1. Interval endplate compression fractures at L1, L3, and L4, compatible with osteoporosis. 2. Prior gastrojejunostomy, along with anastomotic staple line along what is likely a small bowel loop in the pelvis. The bowel loops are difficult to separate due to the patient's striking paucity of bodily adipose tissue including intra-abdominal adipose tissue. There is formed stool in the colon and I do not see definite dilated bowel. 3. Left kidney upper pole cyst.  No definite urinary tract calculi. 4.  Aortic Atherosclerosis (ICD10-I70.0). Electronically Signed   By: Gaylyn Rong M.D.   On: 08/20/2019 17:18   Dg Abdomen Acute W/chest  Result Date: 08/20/2019 CLINICAL DATA:  Abdominal pain EXAM: DG ABDOMEN ACUTE W/ 1V CHEST COMPARISON:  Abdomen series December 26, 2017 FINDINGS: PA chest:  Lungs are hyperexpanded. There are several scattered small calcified granulomas. There is no edema or consolidation. Heart size and pulmonary vascularity are normal. No adenopathy. There old healed rib fractures bilaterally. There is midthoracic dextroscoliosis. Supine and upright abdomen: There are multiple surgical clips in the abdomen and pelvis. There is moderate stool in the colon. There is no bowel dilatation or air-fluid level to suggest bowel obstruction. No free air. Postoperative changes noted in the proximal right femur. Bones are osteoporotic. IMPRESSION: No bowel obstruction or free air. Moderate stool in colon. Extensive postoperative change in the abdomen and pelvis. Lungs mildly hyperexpanded without edema or consolidation. Scattered calcified granulomas noted. Electronically Signed   By: Bretta Bang III M.D.   On: 08/20/2019 13:23   [place] Ekg Site Rite  Result Date: 08/22/2019 If Site Rite image not attached, placement could not be confirmed due to current cardiac rhythm.    CBC Recent Labs  Lab 08/20/19 1249 08/21/19 0504 08/22/19 0503 08/23/19 0423  WBC 4.0 3.4* 3.6* 6.0  HGB 12.7 10.8* 10.0* 8.5*  HCT 39.2 33.9* 31.6* 27.0*  PLT 318 256 220 175  MCV 87.7 90.4 92.4 93.4  MCH 28.4 28.8 29.2 29.4  MCHC 32.4 31.9 31.6 31.5  RDW 20.4* 20.5* 20.7* 21.1*  LYMPHSABS 1.0  --   --   --   MONOABS 0.6  --   --   --   EOSABS 0.0  --   --   --   BASOSABS 0.0  --   --   --     Chemistries  Recent Labs  Lab 08/20/19 1249 08/20/19 1450 08/21/19 0504 08/22/19 0503 08/23/19 0423  NA 131* 134* 139 139 140  K 2.2* 2.6* 3.8 4.4 3.4*  CL 88* 98 107 111 111  CO2 28 25 25 25 23   GLUCOSE 132* 135* 87 95 98  BUN 68* 58* 43* 26* 12  CREATININE 1.51* 1.11* 0.73 0.56 0.43*  CALCIUM 9.3 8.0* 8.0* 7.6* 7.0*  MG  --   --   --  1.8  --   AST 555* 395* 253* 115* 61*  ALT 737* 576* 474* 321* 192*  ALKPHOS 99 81 77 71 52  BILITOT 1.1 1.0 0.8 0.8 1.0    ------------------------------------------------------------------------------------------------------------------ No results for input(s): CHOL, HDL, LDLCALC, TRIG, CHOLHDL, LDLDIRECT in the last 72 hours.  No results found for: HGBA1C ------------------------------------------------------------------------------------------------------------------ No results for input(s): TSH, T4TOTAL, T3FREE, THYROIDAB in the last 72 hours.  Invalid input(s): FREET3 ------------------------------------------------------------------------------------------------------------------ Recent Labs    08/22/19 2035  VITAMINB12 3,021*  FOLATE  28.2  FERRITIN 86  TIBC 162*  IRON 32  RETICCTPCT 1.4    Coagulation profile Recent Labs  Lab 08/22/19 0503  INR 1.0    No results for input(s): DDIMER in the last 72 hours.  Cardiac Enzymes Recent Labs  Lab 08/20/19 1450  CKMB 4.5   ------------------------------------------------------------------------------------------------------------------ No results found for: BNP   Erick BlinksJehanzeb Lyrick Lagrand M.D on 08/23/2019 at 8:28 PM  Go to [email or web address] - for contact info  [facility] - Office  [phone]

## 2019-08-23 NOTE — Progress Notes (Addendum)
[place] NOTE   Pharmacy Consult for TPN Indication: intolerance to enteral feeding  Patient Measurements: Height: 5\' 4"  (162.6 cm) Weight: 71 lb 13.9 oz (32.6 kg) IBW/kg (Calculated) : 54.7 TPN AdjBW (KG): 27.8 Body mass index is 12.34 kg/m.   Assessment: Pharmacy consulted to dose TPN for patient with intolerance to enteral feeding.  She has severe protein/caloric malnutrition with BMI of 11.  She also has history of alcohol abuse.   GI:  Endo: N/A Insulin requirements in the past 24 hours: 0 Lytes: Potassium 3.4   Phos 1.8  Renal: stable.  Creatinine 0.43 Pulm: Cards: hypotension Hepatobil: elevated LFTs.  AST 115> 61  ALT 321> 192 Neuro: ID: N/A  TPN Access: PICC placed 9/16 TPN start date: 9/18 Nutritional Goals pending dietary consult KCal:  Protein:  Fluid:   Current Nutrition:   Plan:  Follow-up dietary recommendations. Recommend D10W or can continue current fluids until TPN can be started- past [place] deadline. TPN labs ordered for AM.  Margot Ables, PharmD Clinical Pharmacist 08/23/2019 3:10 PM

## 2019-08-23 NOTE — Progress Notes (Signed)
TPN now ordered. Writer called Pharmacy who stated that the TPN will not start until tomorrow due to [place] for TPN orders is by noon.  Notified Dr. Roderic Palau.

## 2019-08-24 ENCOUNTER — Inpatient Hospital Stay (HOSPITAL_COMMUNITY): Payer: Medicare Other | Admitting: Anesthesiology

## 2019-08-24 ENCOUNTER — Encounter (HOSPITAL_COMMUNITY): Payer: Self-pay | Admitting: Internal Medicine

## 2019-08-24 ENCOUNTER — Encounter (HOSPITAL_COMMUNITY)
Admission: EM | Disposition: A | Discharge status: Skilled Nursing Facility | Payer: Self-pay | Source: Home / Self Care | Attending: Internal Medicine

## 2019-08-24 DIAGNOSIS — K210 Gastro-esophageal reflux disease with esophagitis: Secondary | ICD-10-CM

## 2019-08-24 DIAGNOSIS — Z980 Intestinal bypass and anastomosis status: Secondary | ICD-10-CM

## 2019-08-24 DIAGNOSIS — K9189 Other postprocedural complications and disorders of digestive system: Secondary | ICD-10-CM

## 2019-08-24 DIAGNOSIS — K449 Diaphragmatic hernia without obstruction or gangrene: Secondary | ICD-10-CM

## 2019-08-24 HISTORY — PX: ESOPHAGOGASTRODUODENOSCOPY (EGD) WITH PROPOFOL: SHX5813

## 2019-08-24 LAB — CBC
HCT: 25.2 % — ABNORMAL LOW (ref 36.0–46.0)
Hemoglobin: 7.9 g/dL — ABNORMAL LOW (ref 12.0–15.0)
MCH: 29.7 pg (ref 26.0–34.0)
MCHC: 31.3 g/dL (ref 30.0–36.0)
MCV: 94.7 fL (ref 80.0–100.0)
Platelets: 164 10*3/uL (ref 150–400)
RBC: 2.66 MIL/uL — ABNORMAL LOW (ref 3.87–5.11)
RDW: 21.1 % — ABNORMAL HIGH (ref 11.5–15.5)
WBC: 8 10*3/uL (ref 4.0–10.5)
nRBC: 0 % (ref 0.0–0.2)

## 2019-08-24 LAB — DIFFERENTIAL
Abs Immature Granulocytes: 0.04 10*3/uL (ref 0.00–0.07)
Basophils Absolute: 0 10*3/uL (ref 0.0–0.1)
Basophils Relative: 0 %
Eosinophils Absolute: 0.1 10*3/uL (ref 0.0–0.5)
Eosinophils Relative: 1 %
Immature Granulocytes: 1 %
Lymphocytes Relative: 22 %
Lymphs Abs: 1.8 10*3/uL (ref 0.7–4.0)
Monocytes Absolute: 0.4 10*3/uL (ref 0.1–1.0)
Monocytes Relative: 5 %
Neutro Abs: 5.8 10*3/uL (ref 1.7–7.7)
Neutrophils Relative %: 71 %

## 2019-08-24 LAB — COMPREHENSIVE METABOLIC PANEL
ALT: 134 U/L — ABNORMAL HIGH (ref 0–44)
AST: 41 U/L (ref 15–41)
Albumin: 2.7 g/dL — ABNORMAL LOW (ref 3.5–5.0)
Alkaline Phosphatase: 50 U/L (ref 38–126)
Anion gap: 5 (ref 5–15)
BUN: 9 mg/dL (ref 8–23)
CO2: 22 mmol/L (ref 22–32)
Calcium: 6.8 mg/dL — ABNORMAL LOW (ref 8.9–10.3)
Chloride: 117 mmol/L — ABNORMAL HIGH (ref 98–111)
Creatinine, Ser: 0.38 mg/dL — ABNORMAL LOW (ref 0.44–1.00)
GFR calc Af Amer: >60 mL/min (ref >60)
GFR calc non Af Amer: >60 mL/min (ref >60)
Glucose, Bld: 100 mg/dL — ABNORMAL HIGH (ref 70–99)
Potassium: 3.6 mmol/L (ref 3.5–5.1)
Sodium: 144 mmol/L (ref 135–145)
Total Bilirubin: 0.8 mg/dL (ref 0.3–1.2)
Total Protein: 4.4 g/dL — ABNORMAL LOW (ref 6.5–8.1)

## 2019-08-24 LAB — PHOSPHORUS: Phosphorus: 1.2 mg/dL — ABNORMAL LOW (ref 2.5–4.6)

## 2019-08-24 LAB — PREALBUMIN: Prealbumin: 11.9 mg/dL — ABNORMAL LOW (ref 18–38)

## 2019-08-24 LAB — TRIGLYCERIDES: Triglycerides: 25 mg/dL (ref <150)

## 2019-08-24 LAB — MAGNESIUM: Magnesium: 1.2 mg/dL — ABNORMAL LOW (ref 1.7–2.4)

## 2019-08-24 SURGERY — ESOPHAGOGASTRODUODENOSCOPY (EGD) WITH PROPOFOL
Anesthesia: General

## 2019-08-24 MED ORDER — MAGNESIUM SULFATE 2 GM/50ML IV SOLN
2.0000 g | Freq: Once | INTRAVENOUS | Status: AC
  Administered 2019-08-24: 2 g via INTRAVENOUS
  Filled 2019-08-24: qty 50

## 2019-08-24 MED ORDER — SODIUM CHLORIDE 0.9 % IV SOLN: INTRAVENOUS | Status: DC

## 2019-08-24 MED ORDER — SODIUM CHLORIDE 0.9% FLUSH
INTRAVENOUS | Status: AC
  Filled 2019-08-24: qty 30

## 2019-08-24 MED ORDER — POTASSIUM PHOSPHATES 15 MMOLE/5ML IV SOLN
20.0000 mmol | Freq: Once | INTRAVENOUS | Status: AC
  Administered 2019-08-24: 20 mmol via INTRAVENOUS
  Filled 2019-08-24: qty 6.67

## 2019-08-24 MED ORDER — ETOMIDATE 2 MG/ML IV SOLN
INTRAVENOUS | Status: DC | PRN
  Administered 2019-08-24: 4 mg via INTRAVENOUS

## 2019-08-24 MED ORDER — LIDOCAINE HCL (CARDIAC) PF 100 MG/5ML IV SOSY
PREFILLED_SYRINGE | INTRAVENOUS | Status: DC | PRN
  Administered 2019-08-24: 60 mg via INTRAVENOUS

## 2019-08-24 MED ORDER — DEXTROSE-NACL 5-0.9 % IV SOLN
INTRAVENOUS | Status: DC
  Administered 2019-08-24 – 2019-08-25 (×2): via INTRAVENOUS

## 2019-08-24 MED ORDER — TRAVASOL 10 % IV SOLN
INTRAVENOUS | Status: AC
  Administered 2019-08-24: 19:00:00 via INTRAVENOUS
  Filled 2019-08-24: qty 345.6

## 2019-08-24 MED ORDER — PROPOFOL 10 MG/ML IV BOLUS
INTRAVENOUS | Status: DC | PRN
  Administered 2019-08-24: 50 mg via INTRAVENOUS

## 2019-08-24 MED ORDER — LACTATED RINGERS IV SOLN
Freq: Once | INTRAVENOUS | Status: AC
  Administered 2019-08-24: 13:00:00 via INTRAVENOUS

## 2019-08-24 MED ORDER — KETAMINE HCL 50 MG/5ML IJ SOSY
PREFILLED_SYRINGE | INTRAMUSCULAR | Status: AC
  Filled 2019-08-24: qty 5

## 2019-08-24 MED ORDER — SUCCINYLCHOLINE CHLORIDE 200 MG/10ML IV SOSY
PREFILLED_SYRINGE | INTRAVENOUS | Status: DC | PRN
  Administered 2019-08-24: 60 mg via INTRAVENOUS

## 2019-08-24 NOTE — Anesthesia Preprocedure Evaluation (Addendum)
Anesthesia Evaluation  Patient identified by MRN, date of birth, ID band Patient awake    Reviewed: Allergy & Precautions, NPO status , Patient's Chart, lab work & pertinent test results  Airway Mallampati: II  TM Distance: >3 FB Neck ROM: Full    Dental  (+) Edentulous Lower, Edentulous Upper   Pulmonary COPD,  COPD inhaler, Current Smoker,    Pulmonary exam normal breath sounds clear to auscultation       Cardiovascular Exercise Tolerance: Poor  Rhythm:Regular Rate:Normal  20-Aug-2019 12:55:23 [facility]-ED ROUTINE RECORD Sinus bradycardia with Premature atrial complexes Right bundle branch block Abnormal ECG   Neuro/Psych PSYCHIATRIC DISORDERS    GI/Hepatic Neg liver ROS, hiatal hernia, PUD, GERD  Medicated and Poorly Controlled,  Endo/Other  negative endocrine ROS  Renal/GU negative Renal ROS     Musculoskeletal   Abdominal NGT in place  Peds  Hematology  (+) anemia ,   Anesthesia Other Findings Malnutrition   Reproductive/Obstetrics                             Anesthesia Physical Anesthesia Plan  ASA: IV  Anesthesia Plan: General   Post-op Pain Management:    Induction: Intravenous  PONV Risk Score and Plan:   Airway Management Planned: Oral ETT  Additional Equipment:   Intra-op Plan:   Post-operative Plan:   Informed Consent: I have reviewed the patients History and Physical, chart, labs and discussed the procedure including the risks, benefits and alternatives for the proposed anesthesia with the patient or authorized representative who has indicated his/her understanding and acceptance.       Plan Discussed with: CRNA  Anesthesia Plan Comments:         Anesthesia Quick Evaluation

## 2019-08-24 NOTE — Anesthesia Procedure Notes (Signed)
Procedure Name: Intubation Date/Time: 08/24/2019 3:00 PM Performed by: Georgeanne Nim, CRNA Pre-anesthesia Checklist: Patient identified, Emergency Drugs available, Suction available, Patient being monitored and Timeout performed Patient Re-evaluated:Patient Re-evaluated prior to induction Oxygen Delivery Method: Circle system utilized Preoxygenation: Pre-oxygenation with 100% oxygen Induction Type: IV induction, Rapid sequence and Cricoid Pressure applied Laryngoscope Size: Mac and 3 Grade View: Grade I Tube type: Oral Laser Tube: Cuffed inflated with minimal occlusive pressure - saline Tube size: 6.5 (7.0 too large to pass easily) mm Number of attempts: 1 Airway Equipment and Method: Stylet Placement Confirmation: ETT inserted through vocal cords under direct vision,  positive ETCO2,  CO2 detector and breath sounds checked- equal and bilateral Secured at: 19 cm Tube secured with: Tape Dental Injury: Teeth and Oropharynx as per pre-operative assessment

## 2019-08-24 NOTE — Transfer of Care (Signed)
Immediate Anesthesia Transfer of Care Note  Patient: Kristine Roberts  Procedure(s) Performed: ESOPHAGOGASTRODUODENOSCOPY (EGD) WITH PROPOFOL with stricture dilation (N/A )  Patient Location: PACU  Anesthesia Type:General  Level of Consciousness: awake, alert , oriented and patient cooperative  Airway & Oxygen Therapy: Patient Spontanous Breathing  Post-op Assessment: Report given to RN and Post -op Vital signs reviewed and stable  Post vital signs: Reviewed and stable  Last Vitals:  Vitals Value Taken Time  BP 93/74 08/24/19 1545  Temp 36.7 C 08/24/19 1536  Pulse 71 08/24/19 1556  Resp 30 08/24/19 1556  SpO2 92 % 08/24/19 1556  Vitals shown include unvalidated device data.  Last Pain:  Vitals:   08/24/19 1545  TempSrc:   PainSc: 0-No pain      Patients Stated Pain Goal: 7 (63/01/60 1093)  Complications: No apparent anesthesia complications

## 2019-08-24 NOTE — Progress Notes (Addendum)
Patient Demographics:    Kristine Roberts, is a 63 y.o. female, DOB - 04-Aug-1956, ZDG:[id number]  Admit date - 08/20/2019   Admitting Physician Jani Gravel, MD  Outpatient Primary MD for the patient is [facility]  LOS - 4   Chief Complaint  Patient presents with  . Back Pain        Subjective:    Kristine Roberts has nausea but no vomiting. No abdominal pain. NG tube remains in place  Assessment  & Plan :    Principal Problem:   Hypotension Active Problems:   COPD (chronic obstructive pulmonary disease) (HCC)   Protein-calorie malnutrition, severe   Hypokalemia   Cachexia (HCC)   ETOH abuse   Nausea   Abnormal liver function   Hyponatremia   Dehydration  Brief Summary 63 y.o. female,  w h/o [name], Hiatal hernia, Esophagitis, prior gastrojejunostomy Gastric outlet obstruction, PUD, Copd admitted on 08/20/2019 with dehydration and hypotension as well as elevated LFTs in the setting of vomiting and poor oral intake   A/p 1) hypotension--a.m. cortisol level is 15.1,  -echo with preserved EF over 50%. -Troponins are flat at 55 and 53 - suspect hypotension is secondary to dehydration and poor oral intake, continue IV fluids continue to encourage oral intake -I also suspect that with her extremely low BMI of 11, it is very likely that she normally runs a low blood pressure in the 90s.  Overall, blood pressures have improved since admission  2) elevated LFTs--- AST is down to 115 from 555, ALT is down to 321 from 737--- -bilirubin is not elevated, -Lipase is 59 -Acetaminophen level was 42.. -Acute viral hepatitis panel negative -Discussed with Dr. Laural Golden from GI service -CT abdomen and pelvis with status post cholecystectomy otherwise no acute findings from a hepatobiliary standpoint --Elevation of liver enzymes possibly related to dehydration, volume depletion and ischemic  injury.  Overall labs are improving  3) low back pain--patient with endplate compression fractures at L1 L3 and L4 compatible with osteoporosis--- -if back pain is poorly controlled with narcotics consider kyphoplasty  4)FTT/severe protein caloric malnutrition ---- BMI of 11, dietitian consult requested give supplements.  TPN ordered on 9/17  5)H/o Etoh Abuse-lorazepam per CIWA protocol, continue multivitamin including thiamine and folic acid.  No signs of withdrawal at this time  6) COPD and tobacco abuse--- no acute flareup at this time continue bronchodilators  7) hypovolemic hyponatremia--- improved with IV hydration  8) intractable emesis--patient with history of PUD, prior esophagitis and gastric outlet obstruction--- patient underwent EGD on 9/16 which noted gastric outlet obstruction secondary to circumferential gastrojejunal anastomotic ulcer with stenosis.  NG tube was placed for decompression for repeat EGD 9/18.  PICC line has been placed and she has been started on supplemental TPN   9) hypophosphatemia.  Related to diminished p.o. intake.  Replacing phosphorus  10) hypokalemia.  Replaced  Magnesium is 1.2 and will be replaced  11) Anemia, suspect this is related to chronic disease. downtrending hemoglobin likely related to hemodilution. No signs of bleeding. Continue to follow and transfuse as needed.  Disposition/Need for in-Hospital Stay-discharge home once GI work-up is complete.  Code Status : Full  Family Communication:   NA (patient is alert, awake and coherent)  Disposition Plan  : TBD  Consults  :  Gi  DVT Prophylaxis  :   - Heparin - SCDs   Lab Results  Component Value Date   PLT 164 08/24/2019    Inpatient Medications  Scheduled Meds: . Chlorhexidine Gluconate Cloth  6 each Topical Daily  . feeding supplement  1 Container Oral BID BM  . feeding supplement (ENSURE ENLIVE)  237 mL Oral TID PC  . feeding supplement (PRO-STAT SUGAR FREE 64)  30 mL  Oral BID  . folic acid  1 mg Oral Daily  . lidocaine  1 patch Transdermal Q24H  . LORazepam  0-4 mg Intravenous Q8H  . mouth rinse  15 mL Mouth Rinse BID  . mometasone-formoterol  1 puff Inhalation BID  . nicotine  21 mg Transdermal Daily  . pantoprazole (PROTONIX) IV  40 mg Intravenous Q12H  . sodium chloride flush  10-40 mL Intracatheter Q12H  . thiamine  100 mg Oral Daily   Or  . thiamine  100 mg Intravenous Daily   Continuous Infusions: . dextrose 5 % and 0.9% NaCl    . magnesium sulfate bolus IVPB    . TPN ADULT (ION)     PRN Meds:.albuterol, LORazepam, naphazoline-glycerin, ondansetron (ZOFRAN) IV, phenol, sodium chloride flush    Anti-infectives (From admission, onward)   None        Objective:   Vitals:   08/24/19 1558 08/24/19 1600 08/24/19 1653 08/24/19 1700  BP: 95/70 92/65  95/71  Pulse: 72 72  70  Resp: (!) 28 (!) 24  20  Temp:   98.2 F (36.8 C)   TempSrc:   Oral   SpO2: 95% 91%  97%  Weight:      Height:        Wt Readings from Last 3 Encounters:  08/24/19 34 kg  09/18/18 31.8 kg  08/20/18 31.8 kg     Intake/Output Summary (Last 24 hours) at 08/24/2019 1719 Last data filed at 08/24/2019 1630 Gross per 24 hour  Intake 3847.92 ml  Output 750 ml  Net 3097.92 ml     Physical Exam  General exam: Alert, awake, oriented x 3, NG tube in place Respiratory system: Clear to auscultation. Respiratory effort normal. Cardiovascular system:RRR. No murmurs, rubs, gallops. Gastrointestinal system: Abdomen is nondistended, soft and nontender. No organomegaly or masses felt. Normal bowel sounds heard. Central nervous system: Alert and oriented. No focal neurological deficits. Extremities: No C/C/E, +pedal pulses Skin: No rashes, lesions or ulcers Psychiatry: Judgement and insight appear normal. Mood & affect appropriate.      Data Review:   Micro Results Recent Results (from the past 240 hour(s))  SARS Coronavirus 2 [facility] order, Performed in  [facility] hospital lab) Nasopharyngeal Nasopharyngeal Swab     Status: None   Collection Time: 08/20/19  3:25 PM   Specimen: Nasopharyngeal Swab  Result Value Ref Range Status   SARS Coronavirus 2 NEGATIVE NEGATIVE Final    Comment: (NOTE) If result is NEGATIVE SARS-CoV-2 target nucleic acids are NOT DETECTED. The SARS-CoV-2 RNA is generally detectable in upper and lower  respiratory specimens during the acute phase of infection. The lowest  concentration of SARS-CoV-2 viral copies this assay can detect is 250  copies / mL. A negative result does not preclude SARS-CoV-2 infection  and should not be used as the sole basis for treatment or other  patient management decisions.  A negative result may occur with  improper specimen collection / handling, submission of specimen  other  than nasopharyngeal swab, presence of viral mutation(s) within the  areas targeted by this assay, and inadequate number of viral copies  (<250 copies / mL). A negative result must be combined with clinical  observations, patient history, and epidemiological information. If result is POSITIVE SARS-CoV-2 target nucleic acids are DETECTED. The SARS-CoV-2 RNA is generally detectable in upper and lower  respiratory specimens dur ing the acute phase of infection.  Positive  results are indicative of active infection with SARS-CoV-2.  Clinical  correlation with patient history and other diagnostic information is  necessary to determine patient infection status.  Positive results do  not rule out bacterial infection or co-infection with other viruses. If result is PRESUMPTIVE POSTIVE SARS-CoV-2 nucleic acids MAY BE PRESENT.   A presumptive positive result was obtained on the submitted specimen  and confirmed on repeat testing.  While 2019 novel coronavirus  (SARS-CoV-2) nucleic acids may be present in the submitted sample  additional confirmatory testing may be necessary for epidemiological  and / or clinical  management purposes  to differentiate between  SARS-CoV-2 and other Sarbecovirus currently known to infect humans.  If clinically indicated additional testing with an alternate test  methodology [id number]) is advised. The SARS-CoV-2 RNA is generally  detectable in upper and lower respiratory sp ecimens during the acute  phase of infection. The expected result is Negative. Fact Sheet for Patients:  BoilerBrush.com.cyhttps://www.fda.gov/media/136312/download Fact Sheet for Healthcare Providers: https://pope.com/https://www.fda.gov/media/136313/download This test is not yet approved or cleared by the [place] FDA and has been authorized for detection and/or diagnosis of SARS-CoV-2 by FDA under an Emergency Use Authorization (EUA).  This EUA will remain in effect (meaning this test can be used) for the duration of the COVID-19 declaration under Section 564(b)(1) of the Act, 21 U.S.C. section 360bbb-3(b)(1), unless the authorization is terminated or revoked sooner. Performed at [facility], [address] [place], [place] [address]   MRSA PCR Screening     Status: None   Collection Time: 08/20/19  6:27 PM   Specimen: Nasal Mucosa; Nasopharyngeal  Result Value Ref Range Status   MRSA by PCR NEGATIVE NEGATIVE Final    Comment:        The GeneXpert MRSA Assay (FDA approved for NASAL specimens only), is one component of a comprehensive MRSA colonization surveillance program. It is not intended to diagnose MRSA infection nor to guide or monitor treatment for MRSA infections. Performed at [facility], [address] [place], [place] [address]     Radiology Reports Ct Abdomen Pelvis Wo Contrast  Result Date: 08/20/2019 CLINICAL DATA:  Back pain. Nausea and vomiting. Weight loss. Abnormal liver function. EXAM: CT ABDOMEN AND PELVIS WITHOUT CONTRAST TECHNIQUE: Multidetector CT imaging of the abdomen and pelvis was performed following the standard protocol without IV contrast. COMPARISON:  Multiple exams, including  07/14/2018 and 10/25/2014 FINDINGS: Lower chest: 5 by 4 mm right lower lobe pulmonary nodule along the diaphragm, image 15/4, no change from 10/25/2014, considered benign. Calcified granuloma in the left lower lobe on image 7/4, benign. Hepatobiliary: Cholecystectomy.  Otherwise unremarkable. Pancreas: Considerable atrophy. Spleen: Unremarkable Adrenals/Urinary Tract: Hypodense 1.8 cm left kidney upper pole lesion, likely a cyst, previously 1.6 cm. Adrenal glands unremarkable. No urinary tract calculi are identified. Stomach/Bowel: Postoperative findings including gastrojejunostomy at not stenotic staple lines in upper pelvic loops of bowel Vascular/Lymphatic: Aortoiliac atherosclerotic vascular disease. No well-defined adenopathy. Reproductive: Uterus is thought to be absent.  Adnexa unremarkable. Other: Severe paucity of adipose tissue favoring cachexia. This makes  separation of adjacent abdominal structures such as bowel loops problematic. Musculoskeletal: Right hip screw. Bony demineralization. Interval mild inferior endplate compression fractures at L3 and L4. Interval mild superior endplate compression fracture at L1. Old superior endplate compression fracture L5. IMPRESSION: 1. Interval endplate compression fractures at L1, L3, and L4, compatible with osteoporosis. 2. Prior gastrojejunostomy, along with anastomotic staple line along what is likely a small bowel loop in the pelvis. The bowel loops are difficult to separate due to the patient's striking paucity of bodily adipose tissue including intra-abdominal adipose tissue. There is formed stool in the colon and I do not see definite dilated bowel. 3. Left kidney upper pole cyst.  No definite urinary tract calculi. 4.  Aortic Atherosclerosis (ICD10-I70.0). Electronically Signed   By: Gaylyn RongWalter  Liebkemann M.D.   On: 08/20/2019 17:18   Dg Abdomen Acute W/chest  Result Date: 08/20/2019 CLINICAL DATA:  Abdominal pain EXAM: DG ABDOMEN ACUTE W/ 1V CHEST  COMPARISON:  Abdomen series December 26, 2017 FINDINGS: PA chest: Lungs are hyperexpanded. There are several scattered small calcified granulomas. There is no edema or consolidation. Heart size and pulmonary vascularity are normal. No adenopathy. There old healed rib fractures bilaterally. There is midthoracic dextroscoliosis. Supine and upright abdomen: There are multiple surgical clips in the abdomen and pelvis. There is moderate stool in the colon. There is no bowel dilatation or air-fluid level to suggest bowel obstruction. No free air. Postoperative changes noted in the proximal right femur. Bones are osteoporotic. IMPRESSION: No bowel obstruction or free air. Moderate stool in colon. Extensive postoperative change in the abdomen and pelvis. Lungs mildly hyperexpanded without edema or consolidation. Scattered calcified granulomas noted. Electronically Signed   By: Bretta BangWilliam  Woodruff III M.D.   On: 08/20/2019 13:23   [place] Ekg Site Rite  Result Date: 08/22/2019 If Site Rite image not attached, placement could not be confirmed due to current cardiac rhythm.    CBC Recent Labs  Lab 08/20/19 1249 08/21/19 0504 08/22/19 0503 08/23/19 0423 08/24/19 0410  WBC 4.0 3.4* 3.6* 6.0 8.0  HGB 12.7 10.8* 10.0* 8.5* 7.9*  HCT 39.2 33.9* 31.6* 27.0* 25.2*  PLT 318 256 220 175 164  MCV 87.7 90.4 92.4 93.4 94.7  MCH 28.4 28.8 29.2 29.4 29.7  MCHC 32.4 31.9 31.6 31.5 31.3  RDW 20.4* 20.5* 20.7* 21.1* 21.1*  LYMPHSABS 1.0  --   --   --  1.8  MONOABS 0.6  --   --   --  0.4  EOSABS 0.0  --   --   --  0.1  BASOSABS 0.0  --   --   --  0.0    Chemistries  Recent Labs  Lab 08/20/19 1450 08/21/19 0504 08/22/19 0503 08/23/19 0423 08/24/19 0410  NA 134* 139 139 140 144  K 2.6* 3.8 4.4 3.4* 3.6  CL 98 107 111 111 117*  CO2 25 25 25 23 22   GLUCOSE 135* 87 95 98 100*  BUN 58* 43* 26* 12 9  CREATININE 1.11* 0.73 0.56 0.43* 0.38*  CALCIUM 8.0* 8.0* 7.6* 7.0* 6.8*  MG  --   --  1.8  --  1.2*  AST 395* 253*  115* 61* 41  ALT 576* 474* 321* 192* 134*  ALKPHOS 81 77 71 52 50  BILITOT 1.0 0.8 0.8 1.0 0.8   ------------------------------------------------------------------------------------------------------------------ Recent Labs    08/24/19 0410  TRIG 25    No results found for: HGBA1C ------------------------------------------------------------------------------------------------------------------ No results for input(s): TSH, T4TOTAL, T3FREE, THYROIDAB in  the last 72 hours.  Invalid input(s): FREET3 ------------------------------------------------------------------------------------------------------------------ Recent Labs    08/22/19 2035  VITAMINB12 3,021*  FOLATE 28.2  FERRITIN 86  TIBC 162*  IRON 32  RETICCTPCT 1.4    Coagulation profile Recent Labs  Lab 08/22/19 0503  INR 1.0    No results for input(s): DDIMER in the last 72 hours.  Cardiac Enzymes Recent Labs  Lab 08/20/19 1450  CKMB 4.5   ------------------------------------------------------------------------------------------------------------------ No results found for: BNP   Erick Blinks M.D on 08/24/2019 at 5:19 PM  Go to [email or web address] - for contact info  [facility] - Office  [phone]

## 2019-08-24 NOTE — Op Note (Signed)
[facility] Patient Name: Kristine DuralMarie Roberts Procedure Date: 08/24/2019 2:20 PM MRN: [id number] Date of Birth: 11/25/1956 Attending MD: Lionel DecemberNajeeb Marquise Lambson , MD CSN: [id number] Age: [id number] Admit Type: Inpatient Procedure:                Upper GI endoscopy Indications:              Post-surgical anastomotic stenosis, For therapy of                            post-surgical anastomotic stenosis Providers:                Lionel DecemberNajeeb Markeria Goetsch, MD, Jannett CelestineAnitra Bell, RN, Pandora LeiterNeville David,                            Technician Referring MD:              Medicines:                Propofol per Anesthesia Complications:            No immediate complications. Estimated Blood Loss:     Estimated blood loss was minimal. Procedure:                Pre-Anesthesia Assessment:                           - Prior to the procedure, a History and Physical                            was performed, and patient medications and                            allergies were reviewed. The patient's tolerance of                            previous anesthesia was also reviewed. The risks                            and benefits of the procedure and the sedation                            options and risks were discussed with the patient.                            All questions were answered, and informed consent                            was obtained. Prior Anticoagulants: The patient has                            taken no previous anticoagulant or antiplatelet                            agents. ASA Grade Assessment: IV - A patient with  severe systemic disease that is a constant threat                            to life. After reviewing the risks and benefits,                            the patient was deemed in satisfactory condition to                            undergo the procedure.                           After obtaining informed consent, the endoscope was                            passed under direct  vision. Throughout the                            procedure, the patient's blood pressure, pulse, and                            oxygen saturations were monitored continuously. The                            [id number]-[id number] ([id number]) scope was introduced through                            the mouth, and advanced to the jejunum. The upper                            GI endoscopy was accomplished without difficulty. Scope In: 3:07:53 PM Scope Out: 3:26:47 PM Total Procedure Duration: 0 hours 18 minutes 54 seconds  Findings:      The upper third of the esophagus and middle third of the esophagus were       normal.      LA Grade C (one or more mucosal breaks continuous between tops of 2 or       more mucosal folds, less than 75% circumference) esophagitis with no       bleeding was found 29 to 35 cm from the incisors.      The Z-line was found 35 cm from the incisors.      A small hiatal hernia was present.      Evidence of a stenosed Billroth I gastroduodenostomy was found. A       gastric pouch with a large size was found containing food debris. The       gastroduodenal anastomosis was characterized by severe stenosis and       ulceration. This was traversed initially with ultraslim scope and then       after dilation with Q-scope. A TTS dilator was passed through the scope.       Dilation with a 09-16-11 mm pyloric balloon dilator was performed. The       dilation site was examined and showed mild mucosal disruption, mild       improvement in luminal narrowing and no perforation.      The examined jejunum was  normal. Impression:               - Normal upper third of esophagus and middle third                            of esophagus.                           - LA Grade C reflux esophagitis.                           - Z-line, 35 cm from the incisors.                           - Small hiatal hernia.                           - Stenosed Billroth I gastroduodenostomy was found,                             characterized by ulceration and severe stenosis.                            Dilated.                           - Normal examined jejunum.                           - No specimens collected. Moderate Sedation:      Per Anesthesia Care Recommendation:           - Return patient to ICU for ongoing care.                           - Clear liquid diet today.                           - Continue present medications.                           - No aspirin, ibuprofen, anti-coagulant for 3 days.                           - No NSAIDs.                           - Repeat upper endoscopy in 4 weeks. Procedure Code(s):        --- Professional ---                           [id number], Esophagogastroduodenoscopy, flexible,                            transoral; with dilation of gastric/duodenal                            stricture(s) (eg, balloon, bougie) Diagnosis Code(s):        ---  Professional ---                           K21.0, Gastro-esophageal reflux disease with                            esophagitis                           K44.9, Diaphragmatic hernia without obstruction or                            gangrene                           Z98.0, Intestinal bypass and anastomosis status                           K91.89, Other postprocedural complications and                            disorders of digestive system CPT copyright 2019 [employer]. All rights reserved. The codes documented in this report are preliminary and upon coder review may  be revised to meet current compliance requirements. Lionel DecemberNajeeb Ashton Belote, MD Lionel DecemberNajeeb Grasiela Jonsson, MD 08/24/2019 3:38:49 PM This report has been signed electronically. Number of Addenda: 0

## 2019-08-24 NOTE — Anesthesia Postprocedure Evaluation (Signed)
Anesthesia Post Note  Patient: Kristine Roberts  Procedure(s) Performed: ESOPHAGOGASTRODUODENOSCOPY (EGD) WITH PROPOFOL with stricture dilation (N/A )  Patient location during evaluation: PACU Anesthesia Type: General Level of consciousness: awake Pain management: pain level controlled Vital Signs Assessment: post-procedure vital signs reviewed and stable Respiratory status: spontaneous breathing Cardiovascular status: stable Postop Assessment: no apparent nausea or vomiting Anesthetic complications: no     Last Vitals:  Vitals:   08/24/19 1536 08/24/19 1545  BP: (!) 89/67 93/74  Pulse: (!) 106 81  Resp: (!) 25 (!) 26  Temp: 36.7 C   SpO2: 94% 94%    Last Pain:  Vitals:   08/24/19 1545  TempSrc:   PainSc: 0-No pain                 Everette Rank

## 2019-08-24 NOTE — Care Management Important Message (Signed)
Important Message  Patient Details  Name: Kristine Roberts MRN: [id number] Date of Birth: 1955-12-21   Medicare Important Message Given:  Yes     Tommy Medal 08/24/2019, 11:00 AM

## 2019-08-24 NOTE — Progress Notes (Signed)
  Subjective:  Patient has no complaints.  She denies nausea vomiting chest or abdominal pain.  Objective: Blood pressure 97/68, pulse 68, temperature 98.7 F (37.1 C), temperature source Oral, resp. rate 16, height '5\' 4"'$  (1.626 m), weight 34 kg, SpO2 97 %. Patient is alert and in no acute distress. She has generalized wasting. NG-tube is in place draining bilious fluid. Cardiac exam with regular rhythm normal S1 and S2.  No murmur or gallop noted. Auscultation lungs reveal vesicular breath sounds bilaterally. Abdomen is scaphoid soft and nontender.  Right subcostal scar extending to midline.   Labs/studies Results:  CBC Latest Ref Rng & Units 08/24/2019 08/23/2019 08/22/2019  WBC 4.0 - 10.5 K/uL 8.0 6.0 3.6(L)  Hemoglobin 12.0 - 15.0 g/dL 7.9(L) 8.5(L) 10.0(L)  Hematocrit 36.0 - 46.0 % 25.2(L) 27.0(L) 31.6(L)  Platelets 150 - 400 K/uL 164 175 220    CMP Latest Ref Rng & Units 08/24/2019 08/23/2019 08/22/2019  Glucose 70 - 99 mg/dL 100(H) 98 95  BUN 8 - 23 mg/dL 9 12 26(H)  Creatinine 0.44 - 1.00 mg/dL 0.38(L) 0.43(L) 0.56  Sodium 135 - 145 mmol/L 144 140 139  Potassium 3.5 - 5.1 mmol/L 3.6 3.4(L) 4.4  Chloride 98 - 111 mmol/L 117(H) 111 111  CO2 22 - 32 mmol/L '22 23 25  '$ Calcium 8.9 - 10.3 mg/dL 6.8(L) 7.0(L) 7.6(L)  Total Protein 6.5 - 8.1 g/dL 4.4(L) 4.9(L) 4.3(L)  Total Bilirubin 0.3 - 1.2 mg/dL 0.8 1.0 0.8  Alkaline Phos 38 - 126 U/L 50 52 71  AST 15 - 41 U/L 41 61(H) 115(H)  ALT 0 - 44 U/L 134(H) 192(H) 321(H)    Hepatic Function Latest Ref Rng & Units 08/24/2019 08/23/2019 08/22/2019  Total Protein 6.5 - 8.1 g/dL 4.4(L) 4.9(L) 4.3(L)  Albumin 3.5 - 5.0 g/dL 2.7(L) 3.3(L) 2.0(L)  AST 15 - 41 U/L 41 61(H) 115(H)  ALT 0 - 44 U/L 134(H) 192(H) 321(H)  Alk Phosphatase 38 - 126 U/L 50 52 71  Total Bilirubin 0.3 - 1.2 mg/dL 0.8 1.0 0.8     Assessment:  #1.  Gastric outlet obstruction secondary to recurrent gastrojejunal anastomotic ulcer.  Last EGD was 2 days ago revealing  moderate amount of food debris and circumferential ulcer with a stricture and dilation was not attempted.  The stricture has been dilated previously and more recently in August 2019 while she was at [facility] following hip surgery.  #2.  Anemia.  Multifactorial.  No evidence of overt GI bleed she could be easily losing blood intermittently from peptic ulcer disease.  #3.  Severe malnutrition to the point of cachexia.  Parenteral nutrition was not initiated yesterday because of some confusion.  She still needs to be on parenteral duration as GI tract may not be functional for a while.  Recommendations  Esophagogastroduodenoscopy with stricture dilation later today.

## 2019-08-24 NOTE — Progress Notes (Signed)
Initial Nutrition Assessment  DOCUMENTATION CODES:   Underweight, Severe malnutrition in context of chronic illness  INTERVENTION:  TPN per pharmacy: Recommend 1400-1550 kcals 61-75 grams protein  Monitor magnesium, potassium, and phosphorus daily for at least 3 days, MD to replete as needed, as pt is at risk for refeeding syndrome given nutrition history.   NUTRITION DIAGNOSIS:   Inadequate oral intake related to altered GI function as evidenced by NPO status(TPN). GOAL:   Patient will meet greater than or equal to 90% of their needs   MONITOR:   Weight trends, Labs, I & O's, Supplement acceptance  REASON FOR ASSESSMENT:   Consult New TPN/TNA  ASSESSMENT:   63 year old female with medical history significant for GERD, hiatal hernia, esophagitis, gastric outlet obstruction, gastric stenosis, peptic ulcer disease,  COPD, history of gastrojejunostomy, history of nausea and emesis over the past year  Per chart review, gastric outlet obstruction secondary to circumferential gastrojejunal anastomotic ulcer with stenosis. EGD attempted yesterday; stricture could not be traversed; moderate amount of food debris in the stomach; repeat EGD today with therapeutic intention per GI note.   9/14: FL 9/15: CL/FL 9/16: NGT/PICC  Current wt 34 kg (74.8 lb) UBW 31.3 - 35.1 kg over the past year  Diet Order:   Diet Order            Diet NPO time specified  Diet effective now              EDUCATION NEEDS:   No education needs have been identified at this time  Skin:  Skin Assessment: Reviewed RN Assessment  Last BM:  9/14  Height:   Ht Readings from Last 1 Encounters:  08/20/19 5\' 4"  (1.626 m)    Weight:   Wt Readings from Last 1 Encounters:  08/24/19 34 kg    Ideal Body Weight:     BMI:  Body mass index is 12.87 kg/m.  Estimated Nutritional Needs:   Kcal:  0211-1552 (40-45 kcal/kg)  Protein:  61-75 (1.8-2.2g/kg)  Fluid:  1.4-1.5L   Lajuan Lines,  RD, LDN Office [phone] After Hours/Weekend Pager: [phone]

## 2019-08-24 NOTE — Progress Notes (Signed)
PHARMACY - ADULT TOTAL PARENTERAL NUTRITION CONSULT NOTE   Pharmacy Consult for TPN Indication: NPO=>Malnutrition and Gastric Outlet Obstruction  Patient Measurements: Height: 5\' 4"  (162.6 cm) Weight: 74 lb 15.3 oz (34 kg) IBW/kg (Calculated) : 54.7 TPN AdjBW (KG): 27.8 Body mass index is 12.87 kg/m. Usual Weight: 34kg  Assessment: Gastric outlet obstruction secondary to circumferential gastrojejunal anastomotic ulcer with stenosis . Pt with no complaints and and no N/V. No signs of infection. Will initiate lipids along with CHO/Pro slowly d/t concern for refeeding syndrome.  GI: GOO Endo: EGD was attempted yesterday but the stricture could not be traversed Insulin requirements in the past 24 hours: none Lytes:  K 3.6, Phos 1.2, mag 1.2, Renal:stable Hepatobil:within nl ID: no infection  TPN Access:PICC double lumen TPN start date:08/24/2019 Nutritional Goals (per RD recommendation on 9/18): [id number] (40-45 kcal/kg) Protein: 61-75 grams protein(1.8-2.2g/kg) Fluid:1.4-1.5L  Goal TPN rate is 60 ml/hr   Current Nutrition: NPO  Plan:  Initiate TPN at 53mL/hr. This TPN provides 31g of protein, 108 g of dextrose, and 14.4 g of lipids which provides 649 kCals per day, meeting ~50% of patient needs Electrolytes in TPN: standard.  Kphos 34mmol ordered x1, Mag Sulfate 2gm IV x 1 ordered Add MVI, trace elements to TPN q6h SSI and adjust as needed Make total  IVMF100 ml/hr Monitor TPN labs F/U labs,  Kristine Roberts, Kristine Roberts, BCPS Clinical Pharmacist Pager [phone] 08/24/2019,11:39 AM

## 2019-08-24 NOTE — Progress Notes (Signed)
Brief EGD note.  Grade C reflux esophagitis involving distal 6 cm of esophageal mucosa. Small sliding hiatal hernia. Large gastric pouch with food debris and gastritis. Gastrojejunal anastomotic ulcer with stricture. The stricture was initially overcome with a plus slim scope. Stricture dilated with the balloon to 12 mm and subsequently able to pass Q scope. Patient tolerated the procedure well.

## 2019-08-25 DIAGNOSIS — R945 Abnormal results of liver function studies: Secondary | ICD-10-CM

## 2019-08-25 DIAGNOSIS — E860 Dehydration: Secondary | ICD-10-CM

## 2019-08-25 LAB — DIFFERENTIAL
Abs Immature Granulocytes: 0.03 10*3/uL (ref 0.00–0.07)
Basophils Absolute: 0 10*3/uL (ref 0.0–0.1)
Basophils Relative: 0 %
Eosinophils Absolute: 0.1 10*3/uL (ref 0.0–0.5)
Eosinophils Relative: 1 %
Immature Granulocytes: 0 %
Lymphocytes Relative: 15 %
Lymphs Abs: 1.3 10*3/uL (ref 0.7–4.0)
Monocytes Absolute: 0.3 10*3/uL (ref 0.1–1.0)
Monocytes Relative: 4 %
Neutro Abs: 6.7 10*3/uL (ref 1.7–7.7)
Neutrophils Relative %: 80 %

## 2019-08-25 LAB — COMPREHENSIVE METABOLIC PANEL
ALT: 113 U/L — ABNORMAL HIGH (ref 0–44)
AST: 32 U/L (ref 15–41)
Albumin: 2.6 g/dL — ABNORMAL LOW (ref 3.5–5.0)
Alkaline Phosphatase: 58 U/L (ref 38–126)
Anion gap: 4 — ABNORMAL LOW (ref 5–15)
BUN: 9 mg/dL (ref 8–23)
CO2: 23 mmol/L (ref 22–32)
Calcium: 7 mg/dL — ABNORMAL LOW (ref 8.9–10.3)
Chloride: 116 mmol/L — ABNORMAL HIGH (ref 98–111)
Creatinine, Ser: 0.36 mg/dL — ABNORMAL LOW (ref 0.44–1.00)
GFR calc Af Amer: >60 mL/min (ref >60)
GFR calc non Af Amer: >60 mL/min (ref >60)
Glucose, Bld: 112 mg/dL — ABNORMAL HIGH (ref 70–99)
Potassium: 3.9 mmol/L (ref 3.5–5.1)
Sodium: 143 mmol/L (ref 135–145)
Total Bilirubin: 1 mg/dL (ref 0.3–1.2)
Total Protein: 4.5 g/dL — ABNORMAL LOW (ref 6.5–8.1)

## 2019-08-25 LAB — GLUCOSE, CAPILLARY
Glucose-Capillary: 121 mg/dL — ABNORMAL HIGH (ref 70–99)
Glucose-Capillary: 130 mg/dL — ABNORMAL HIGH (ref 70–99)
Glucose-Capillary: 140 mg/dL — ABNORMAL HIGH (ref 70–99)
Glucose-Capillary: 97 mg/dL (ref 70–99)

## 2019-08-25 LAB — CBC
HCT: 26 % — ABNORMAL LOW (ref 36.0–46.0)
Hemoglobin: 8.2 g/dL — ABNORMAL LOW (ref 12.0–15.0)
MCH: 29.7 pg (ref 26.0–34.0)
MCHC: 31.5 g/dL (ref 30.0–36.0)
MCV: 94.2 fL (ref 80.0–100.0)
Platelets: 172 10*3/uL (ref 150–400)
RBC: 2.76 MIL/uL — ABNORMAL LOW (ref 3.87–5.11)
RDW: 21 % — ABNORMAL HIGH (ref 11.5–15.5)
WBC: 8.4 10*3/uL (ref 4.0–10.5)
nRBC: 0 % (ref 0.0–0.2)

## 2019-08-25 LAB — MAGNESIUM: Magnesium: 1.7 mg/dL (ref 1.7–2.4)

## 2019-08-25 LAB — PREALBUMIN: Prealbumin: 11.9 mg/dL — ABNORMAL LOW (ref 18–38)

## 2019-08-25 LAB — PHOSPHORUS: Phosphorus: 1.7 mg/dL — ABNORMAL LOW (ref 2.5–4.6)

## 2019-08-25 MED ORDER — CITALOPRAM HYDROBROMIDE 20 MG PO TABS
20.0000 mg | ORAL_TABLET | Freq: Every day | ORAL | Status: DC
  Administered 2019-08-25 – 2019-08-31 (×7): 20 mg via ORAL
  Filled 2019-08-25 (×7): qty 1

## 2019-08-25 MED ORDER — PANTOPRAZOLE SODIUM 40 MG PO TBEC
40.0000 mg | DELAYED_RELEASE_TABLET | Freq: Two times a day (BID) | ORAL | Status: DC
  Administered 2019-08-25 – 2019-08-27 (×4): 40 mg via ORAL
  Filled 2019-08-25 (×4): qty 1

## 2019-08-25 MED ORDER — TRAZODONE HCL 50 MG PO TABS
50.0000 mg | ORAL_TABLET | Freq: Every day | ORAL | Status: DC
  Administered 2019-08-25 – 2019-08-29 (×5): 50 mg via ORAL
  Filled 2019-08-25 (×5): qty 1

## 2019-08-25 MED ORDER — TRAVASOL 10 % IV SOLN
INTRAVENOUS | Status: AC
  Administered 2019-08-25: 18:00:00 via INTRAVENOUS
  Filled 2019-08-25: qty 460.8

## 2019-08-25 MED ORDER — ACETAMINOPHEN 325 MG PO TABS
650.0000 mg | ORAL_TABLET | Freq: Four times a day (QID) | ORAL | Status: DC | PRN
  Administered 2019-08-25 – 2019-08-28 (×5): 650 mg via ORAL
  Filled 2019-08-25 (×5): qty 2

## 2019-08-25 MED ORDER — MAGNESIUM SULFATE IN D5W 1-5 GM/100ML-% IV SOLN
1.0000 g | Freq: Once | INTRAVENOUS | Status: AC
  Administered 2019-08-25: 1 g via INTRAVENOUS
  Filled 2019-08-25: qty 100

## 2019-08-25 MED ORDER — ALUM & MAG HYDROXIDE-SIMETH 200-200-20 MG/5ML PO SUSP
30.0000 mL | Freq: Four times a day (QID) | ORAL | Status: DC | PRN
  Administered 2019-08-25 – 2019-08-28 (×7): 30 mL via ORAL
  Filled 2019-08-25 (×7): qty 30

## 2019-08-25 MED ORDER — POTASSIUM PHOSPHATES 15 MMOLE/5ML IV SOLN
20.0000 mmol | Freq: Once | INTRAVENOUS | Status: AC
  Administered 2019-08-25: 20 mmol via INTRAVENOUS
  Filled 2019-08-25: qty 6.67

## 2019-08-25 NOTE — Progress Notes (Signed)
PHARMACY - ADULT TOTAL PARENTERAL NUTRITION CONSULT NOTE   Pharmacy Consult for TPN Indication: NPO=>Malnutrition and Gastric Outlet Obstruction  Patient Measurements: Height: 5' (152.4 cm) Weight: 74 lb 15.3 oz (34 kg) IBW/kg (Calculated) : 45.5 TPN AdjBW (KG): 34 Body mass index is 14.64 kg/m. Usual Weight: 34kg  Assessment: Gastric outlet obstruction secondary to circumferential gastrojejunal anastomotic ulcer with stenosis . Pt with no complaints and and no N/V. No signs of infection. Will initiate lipids along with CHO/Pro slowly d/t concern for refeeding syndrome. Glucose stable, magnesium to be replaced, phosphorus to be replaced  GI: GOO Endo: EGD was attempted yesterday but the stricture could not be traversed Insulin requirements in the past 24 hours: none Lytes:  K 3.6>>3.9, Phos 1.2>>1.7, mag 1.2>> 1.7 Renal:stable Hepatobil:within nl ID: no infection  TPN Access:PICC double lumen TPN start date:08/24/2019 Nutritional Goals (per RD recommendation on 9/18): [id number] (40-45 kcal/kg) Protein: 61-75 grams protein(1.8-2.2g/kg) Fluid:1.4-1.5L  Goal TPN rate is 60 ml/hr   Current Nutrition: NPO  Plan:  Increase TPN at 80mL/hr. This TPN provides 46g of protein, 144g of dextrose, and 29g of lipids which provides 961 kCals per day, meeting ~67% of patient needs Electrolytes in TPN: standard.  Kphos 21mmol ordered x1, Mag Sulfate 1gm IV x 1 ordered Add MVI, trace elements to TPN q6h SSI and adjust as needed Make total  IVMF100 ml/hr Monitor TPN labs F/U labs,  Isac Sarna, BS Vena Austria, BCPS Clinical Pharmacist Pager [phone] 08/25/2019,8:52 AM

## 2019-08-25 NOTE — Progress Notes (Signed)
   Assessment/Plan: ADMITTED WITH NAUSEA/VOMITING. S/P EGD/DIL SEP 18. CLINICALLY IMPROVED. LIVER ENZYMES ARE IMPROVING.  PLAN: 1. RE-START CELEXA/TRAZODONE. 2. WILL ADVANCE TO SOFT GASTRIC BYPASS DIET TO SEE IF PT TOLERATES IT. 3. PROTONIX PO.    Subjective: Since last evaluated the patient pt is tolerating her liquids. Would like diet advanced. Wants to know if we can re-start her antidepressants. WANTS TO KNOW IF SHE CAN MOVE TO ANOTHER ROOM.STILL HAVING SOME EPIGASTRIC DISCOMFORT. NO NAUSEA OR VOMITING.   Objective: Vital signs in last 24 hours: Vitals:   08/25/19 0734 08/25/19 0804  BP:    Pulse:    Resp:    Temp:  98.6 F (37 C)  SpO2: 100%    General appearance: alert, cooperative and no distress Resp: clear to auscultation bilaterally Cardio: regular rate and rhythm GI: soft, MILDLY tender IN THE EPIGASTRIUM; bowel sounds normal;  Lab Results:  K 3.9 Cr 0.36 ALT 113 AST 32   Studies/Results: No results found.  Medications: I have reviewed the patient's current medications.

## 2019-08-25 NOTE — Progress Notes (Signed)
Patient Demographics:    Kristine Roberts, is a 63 y.o. female, DOB - [birth year], LNL:[id number]  Admit date - 08/20/2019   Admitting Physician Pearson Grippe, MD  Outpatient Primary MD for the patient is [facility]  LOS - 5   Chief Complaint  Patient presents with  . Back Pain        Subjective:    Kadian Murrey no further vomiting, still has some nausea but tolerating liquids.  Assessment  & Plan :    Principal Problem:   Hypotension Active Problems:   COPD (chronic obstructive pulmonary disease) (HCC)   Protein-calorie malnutrition, severe   Hypokalemia   Cachexia (HCC)   ETOH abuse   Nausea   Abnormal liver function   Hyponatremia   Dehydration  Brief Summary 63 y.o. female,  w h/o [name], Hiatal hernia, Esophagitis, prior gastrojejunostomy Gastric outlet obstruction, PUD, Copd admitted on 08/20/2019 with dehydration and hypotension as well as elevated LFTs in the setting of vomiting and poor oral intake   A/p 1) hypotension--a.m. cortisol level is 15.1,  -echo with preserved EF over 50%. -Troponins are flat at 55 and 53 - suspect hypotension is secondary to dehydration and poor oral intake, continue IV fluids continue to encourage oral intake -I also suspect that with her extremely low BMI of 11, it is very likely that she normally runs a low blood pressure in the 90s.  Overall, blood pressures have improved since admission  2) elevated LFTs--- AST is down to 115 from 555, ALT is down to 321 from 737--- -bilirubin is not elevated, -Lipase is 59 -Acetaminophen level was 42.. -Acute viral hepatitis panel negative -Discussed with Dr. Karilyn Cota from GI service -CT abdomen and pelvis with status post cholecystectomy otherwise no acute findings from a hepatobiliary standpoint --Elevation of liver enzymes possibly related to dehydration, volume depletion and ischemic injury.   Overall labs are improving  3) low back pain--patient with endplate compression fractures at L1 L3 and L4 compatible with osteoporosis--- -if back pain is poorly controlled with narcotics consider kyphoplasty  4)FTT/severe protein caloric malnutrition ---- BMI of 11, dietitian consult requested give supplements.  TPN ordered on 9/17  5)H/o Etoh Abuse-lorazepam per CIWA protocol, continue multivitamin including thiamine and folic acid.  No signs of withdrawal at this time  6) COPD and tobacco abuse--- no acute flareup at this time continue bronchodilators  7) hypovolemic hyponatremia--- improved with IV hydration  8) intractable emesis--patient with history of PUD, prior esophagitis and gastric outlet obstruction--- patient underwent EGD on 9/16 which noted gastric outlet obstruction secondary to circumferential gastrojejunal anastomotic ulcer with stenosis.  NG tube was placed for decompression.  Repeat EGD 9/18 with dilation of the pylorus and removal of NG tube.  She was started on clear liquids.  Advancing diet per GI.  PICC line has been placed and she is currently on supplemental TPN   9) hypophosphatemia.  Related to diminished p.o. intake.  Replacing phosphorus  10) hypokalemia.  Replaced  Magnesium is 1.7  11) Anemia, suspect this is related to chronic disease. downtrending hemoglobin likely related to hemodilution. No signs of bleeding. Continue to follow and transfuse as needed.  Disposition/Need for in-Hospital Stay-discharge home once GI work-up is complete.  Code Status :  Full  Family Communication:   NA (patient is alert, awake and coherent)   Disposition Plan  : TBD  Consults  :  Gi  DVT Prophylaxis  :   - Heparin - SCDs   Lab Results  Component Value Date   PLT 172 08/25/2019    Inpatient Medications  Scheduled Meds: . Chlorhexidine Gluconate Cloth  6 each Topical Daily  . citalopram  20 mg Oral Daily  . feeding supplement  1 Container Oral BID BM  .  feeding supplement (PRO-STAT SUGAR FREE 64)  30 mL Oral BID  . folic acid  1 mg Oral Daily  . lidocaine  1 patch Transdermal Q24H  . mouth rinse  15 mL Mouth Rinse BID  . mometasone-formoterol  1 puff Inhalation BID  . nicotine  21 mg Transdermal Daily  . pantoprazole  40 mg Oral BID AC  . sodium chloride flush  10-40 mL Intracatheter Q12H  . thiamine  100 mg Oral Daily   Or  . thiamine  100 mg Intravenous Daily  . traZODone  50 mg Oral QHS   Continuous Infusions: . dextrose 5 % and 0.9% NaCl 70 mL/hr at 08/25/19 1047  . TPN ADULT (ION) 40 mL/hr at 08/25/19 1754   PRN Meds:.acetaminophen, albuterol, LORazepam, naphazoline-glycerin, ondansetron (ZOFRAN) IV, phenol, sodium chloride flush    Anti-infectives (From admission, onward)   None        Objective:   Vitals:   08/25/19 1200 08/25/19 1300 08/25/19 1400 08/25/19 1500  BP: 94/72 107/73 (!) 94/59 101/69  Pulse: 70 68 75 83  Resp:      Temp:      TempSrc:      SpO2:      Weight:      Height:        Wt Readings from Last 3 Encounters:  08/24/19 34 kg  09/18/18 31.8 kg  08/20/18 31.8 kg     Intake/Output Summary (Last 24 hours) at 08/25/2019 1837 Last data filed at 08/25/2019 1700 Gross per 24 hour  Intake 3298.27 ml  Output 200 ml  Net 3098.27 ml     Physical Exam  General exam: Alert, awake, oriented x 3 Respiratory system: Clear to auscultation. Respiratory effort normal. Cardiovascular system:RRR. No murmurs, rubs, gallops. Gastrointestinal system: Abdomen is nondistended, soft and nontender. No organomegaly or masses felt. Normal bowel sounds heard. Central nervous system: Alert and oriented. No focal neurological deficits. Extremities: No C/C/E, +pedal pulses Skin: No rashes, lesions or ulcers Psychiatry: Judgement and insight appear normal. Mood & affect appropriate.       Data Review:   Micro Results Recent Results (from the past 240 hour(s))  SARS Coronavirus 2 [facility](Hospital order, [facility]) Nasopharyngeal Nasopharyngeal Swab     Status: None   Collection Time: 08/20/19  3:25 PM   Specimen: Nasopharyngeal Swab  Result Value Ref Range Status   SARS Coronavirus 2 NEGATIVE NEGATIVE Final    Comment: (NOTE) If result is NEGATIVE SARS-CoV-2 target nucleic acids are NOT DETECTED. The SARS-CoV-2 RNA is generally detectable in upper and lower  respiratory specimens during the acute phase of infection. The lowest  concentration of SARS-CoV-2 viral copies this assay can detect is 250  copies / mL. A negative result does not preclude SARS-CoV-2 infection  and should not be used as the sole basis for treatment or other  patient management decisions.  A negative result may occur with  improper specimen collection / handling, submission  of specimen other  than nasopharyngeal swab, presence of viral mutation(s) within the  areas targeted by this assay, and inadequate number of viral copies  (<250 copies / mL). A negative result must be combined with clinical  observations, patient history, and epidemiological information. If result is POSITIVE SARS-CoV-2 target nucleic acids are DETECTED. The SARS-CoV-2 RNA is generally detectable in upper and lower  respiratory specimens dur ing the acute phase of infection.  Positive  results are indicative of active infection with SARS-CoV-2.  Clinical  correlation with patient history and other diagnostic information is  necessary to determine patient infection status.  Positive results do  not rule out bacterial infection or co-infection with other viruses. If result is PRESUMPTIVE POSTIVE SARS-CoV-2 nucleic acids MAY BE PRESENT.   A presumptive positive result was obtained on the submitted specimen  and confirmed on repeat testing.  While 2019 novel coronavirus  (SARS-CoV-2) nucleic acids may be present in the submitted sample  additional confirmatory testing may be necessary for epidemiological  and / or clinical  management purposes  to differentiate between  SARS-CoV-2 and other Sarbecovirus currently known to infect humans.  If clinically indicated additional testing with an alternate test  methodology [phone]) is advised. The SARS-CoV-2 RNA is generally  detectable in upper and lower respiratory sp ecimens during the acute  phase of infection. The expected result is Negative. Fact Sheet for Patients:  StrictlyIdeas.no Fact Sheet for Healthcare Providers: BankingDealers.co.za This test is not yet approved or cleared by the [place] FDA and has been authorized for detection and/or diagnosis of SARS-CoV-2 by FDA under an Emergency Use Authorization (EUA).  This EUA will remain in effect (meaning this test can be used) for the duration of the COVID-19 declaration under Section 564(b)(1) of the Act, 21 U.S.C. section 360bbb-3(b)(1), unless the authorization is terminated or revoked sooner. Performed at [facility], [address], [place], [place] [address]   MRSA PCR Screening     Status: None   Collection Time: 08/20/19  6:27 PM   Specimen: Nasal Mucosa; Nasopharyngeal  Result Value Ref Range Status   MRSA by PCR NEGATIVE NEGATIVE Final    Comment:        The GeneXpert MRSA Assay (FDA approved for NASAL specimens only), is one component of a comprehensive MRSA colonization surveillance program. It is not intended to diagnose MRSA infection nor to guide or monitor treatment for MRSA infections. Performed at [facility], [address] [place], [place] [address]     Radiology Reports Ct Abdomen Pelvis Wo Contrast  Result Date: 08/20/2019 CLINICAL DATA:  Back pain. Nausea and vomiting. Weight loss. Abnormal liver function. EXAM: CT ABDOMEN AND PELVIS WITHOUT CONTRAST TECHNIQUE: Multidetector CT imaging of the abdomen and pelvis was performed following the standard protocol without IV contrast. COMPARISON:  Multiple exams, including  07/14/2018 and 10/25/2014 FINDINGS: Lower chest: 5 by 4 mm right lower lobe pulmonary nodule along the diaphragm, image 15/4, no change from 10/25/2014, considered benign. Calcified granuloma in the left lower lobe on image 7/4, benign. Hepatobiliary: Cholecystectomy.  Otherwise unremarkable. Pancreas: Considerable atrophy. Spleen: Unremarkable Adrenals/Urinary Tract: Hypodense 1.8 cm left kidney upper pole lesion, likely a cyst, previously 1.6 cm. Adrenal glands unremarkable. No urinary tract calculi are identified. Stomach/Bowel: Postoperative findings including gastrojejunostomy at not stenotic staple lines in upper pelvic loops of bowel Vascular/Lymphatic: Aortoiliac atherosclerotic vascular disease. No well-defined adenopathy. Reproductive: Uterus is thought to be absent.  Adnexa unremarkable. Other: Severe paucity of adipose tissue favoring cachexia.  This makes separation of adjacent abdominal structures such as bowel loops problematic. Musculoskeletal: Right hip screw. Bony demineralization. Interval mild inferior endplate compression fractures at L3 and L4. Interval mild superior endplate compression fracture at L1. Old superior endplate compression fracture L5. IMPRESSION: 1. Interval endplate compression fractures at L1, L3, and L4, compatible with osteoporosis. 2. Prior gastrojejunostomy, along with anastomotic staple line along what is likely a small bowel loop in the pelvis. The bowel loops are difficult to separate due to the patient's striking paucity of bodily adipose tissue including intra-abdominal adipose tissue. There is formed stool in the colon and I do not see definite dilated bowel. 3. Left kidney upper pole cyst.  No definite urinary tract calculi. 4.  Aortic Atherosclerosis (ICD10-I70.0). Electronically Signed   By: Gaylyn Rong M.D.   On: 08/20/2019 17:18   Dg Abdomen Acute W/chest  Result Date: 08/20/2019 CLINICAL DATA:  Abdominal pain EXAM: DG ABDOMEN ACUTE W/ 1V CHEST  COMPARISON:  Abdomen series December 26, 2017 FINDINGS: PA chest: Lungs are hyperexpanded. There are several scattered small calcified granulomas. There is no edema or consolidation. Heart size and pulmonary vascularity are normal. No adenopathy. There old healed rib fractures bilaterally. There is midthoracic dextroscoliosis. Supine and upright abdomen: There are multiple surgical clips in the abdomen and pelvis. There is moderate stool in the colon. There is no bowel dilatation or air-fluid level to suggest bowel obstruction. No free air. Postoperative changes noted in the proximal right femur. Bones are osteoporotic. IMPRESSION: No bowel obstruction or free air. Moderate stool in colon. Extensive postoperative change in the abdomen and pelvis. Lungs mildly hyperexpanded without edema or consolidation. Scattered calcified granulomas noted. Electronically Signed   By: Bretta Bang III M.D.   On: 08/20/2019 13:23   [place] Ekg Site Rite  Result Date: 08/22/2019 If Site Rite image not attached, placement could not be confirmed due to current cardiac rhythm.    CBC Recent Labs  Lab 08/20/19 1249 08/21/19 0504 08/22/19 0503 08/23/19 0423 08/24/19 0410 08/25/19 0441  WBC 4.0 3.4* 3.6* 6.0 8.0 8.4  HGB 12.7 10.8* 10.0* 8.5* 7.9* 8.2*  HCT 39.2 33.9* 31.6* 27.0* 25.2* 26.0*  PLT 318 256 220 175 164 172  MCV 87.7 90.4 92.4 93.4 94.7 94.2  MCH 28.4 28.8 29.2 29.4 29.7 29.7  MCHC 32.4 31.9 31.6 31.5 31.3 31.5  RDW 20.4* 20.5* 20.7* 21.1* 21.1* 21.0*  LYMPHSABS 1.0  --   --   --  1.8 1.3  MONOABS 0.6  --   --   --  0.4 0.3  EOSABS 0.0  --   --   --  0.1 0.1  BASOSABS 0.0  --   --   --  0.0 0.0    Chemistries  Recent Labs  Lab 08/21/19 0504 08/22/19 0503 08/23/19 0423 08/24/19 0410 08/25/19 0441  NA 139 139 140 144 143  K 3.8 4.4 3.4* 3.6 3.9  CL 107 111 111 117* 116*  CO2 25 25 23 22 23   GLUCOSE 87 95 98 100* 112*  BUN 43* 26* 12 9 9   CREATININE 0.73 0.56 0.43* 0.38* 0.36*  CALCIUM  8.0* 7.6* 7.0* 6.8* 7.0*  MG  --  1.8  --  1.2* 1.7  AST 253* 115* 61* 41 32  ALT 474* 321* 192* 134* 113*  ALKPHOS 77 71 52 50 58  BILITOT 0.8 0.8 1.0 0.8 1.0   ------------------------------------------------------------------------------------------------------------------ Recent Labs    08/24/19 0410  TRIG 25    No  results found for: HGBA1C ------------------------------------------------------------------------------------------------------------------ No results for input(s): TSH, T4TOTAL, T3FREE, THYROIDAB in the last 72 hours.  Invalid input(s): FREET3 ------------------------------------------------------------------------------------------------------------------ Recent Labs    08/22/19 2035  VITAMINB12 3,021*  FOLATE 28.2  FERRITIN 86  TIBC 162*  IRON 32  RETICCTPCT 1.4    Coagulation profile Recent Labs  Lab 08/22/19 0503  INR 1.0    No results for input(s): DDIMER in the last 72 hours.  Cardiac Enzymes Recent Labs  Lab 08/20/19 1450  CKMB 4.5   ------------------------------------------------------------------------------------------------------------------ No results found for: BNP   Erick BlinksJehanzeb Memon M.D on 08/25/2019 at 6:37 PM  Go to [email or web address] - for contact info  [facility] - Office  [phone]

## 2019-08-25 NOTE — Progress Notes (Signed)
Nutrition Follow-up  DOCUMENTATION CODES:   Underweight, Severe malnutrition in context of chronic illness  INTERVENTION:   Continue to monitor magnesium, potassium, and phosphorus daily for at least 3 days, MD to replete as needed, as pt is at risk for refeeding syndrome given nutrition history.  -TPN per pharmacy  -Boost Breeze po BID, each supplement provides 250 kcal and 9 grams of protein  -Advance diet as medically feasible  -Discontinue Ensure Enlive due to CL diet order  NUTRITION DIAGNOSIS:   Inadequate oral intake related to altered GI function as evidenced by NPO status(TPN). Ongoing  GOAL:   Patient will meet greater than or equal to 90% of their needs  Progressing; TPN @ 30 ml/hr provides 50% of needs, diet advanced to CL  MONITOR:   Weight trends, Labs, I & O's, Supplement acceptance  REASON FOR ASSESSMENT:   Consult New TPN/TNA  ASSESSMENT:   RD working remotely.   63 year old female with medical history significant for GERD, hiatal hernia, esophagitis, gastric outlet obstruction, gastric stenosis, peptic ulcer disease,  COPD, history of gastrojejunostomy, history of nausea and emesis over the past year  TPN: @  30 ml/hr providing 694 kcals and 31 grams protein (50% of needs)  9/18 EGD: stricture dilation  Diet advanced to CL liquids s/p EGD. Will order Boost Breeze, continue to monitor for diet advancement  Medications reviewed and include: protonix, folic acid, thiamine, magnesium sulfate, potassium phosphate  Labs: CBGS 121-140 Hypokalemia - resolved Ca corrected for low albumin 8.1 (L) Hgb 8.2 - trending up Triglycerides  25  Diet Order:   Diet Order            Diet clear liquid Room service appropriate? Yes; Fluid consistency: Thin  Diet effective now              EDUCATION NEEDS:   No education needs have been identified at this time  Skin:  Skin Assessment: Reviewed RN Assessment  Last BM:  9/14  Height:   Ht  Readings from Last 1 Encounters:  08/24/19 5' (1.524 m)    Weight:   Wt Readings from Last 1 Encounters:  08/24/19 34 kg    Ideal Body Weight:  54.5 kg  BMI:  Body mass index is 14.64 kg/m.  Estimated Nutritional Needs:   Kcal:  6283-1517 (40-45 kcal/kg)  Protein:  61-75 (1.8-2.2g/kg)  Fluid:  1.4-1.5L  Lajuan Lines, RD, [place] [phone] After Hours/Weekend Pager: [phone]

## 2019-08-26 DIAGNOSIS — N179 Acute kidney failure, unspecified: Secondary | ICD-10-CM

## 2019-08-26 DIAGNOSIS — R64 Cachexia: Secondary | ICD-10-CM

## 2019-08-26 LAB — COMPREHENSIVE METABOLIC PANEL
ALT: 95 U/L — ABNORMAL HIGH (ref 0–44)
AST: 40 U/L (ref 15–41)
Albumin: 2.5 g/dL — ABNORMAL LOW (ref 3.5–5.0)
Alkaline Phosphatase: 53 U/L (ref 38–126)
Anion gap: 3 — ABNORMAL LOW (ref 5–15)
BUN: 11 mg/dL (ref 8–23)
CO2: 24 mmol/L (ref 22–32)
Calcium: 7.3 mg/dL — ABNORMAL LOW (ref 8.9–10.3)
Chloride: 112 mmol/L — ABNORMAL HIGH (ref 98–111)
Creatinine, Ser: 0.32 mg/dL — ABNORMAL LOW (ref 0.44–1.00)
GFR calc Af Amer: >60 mL/min (ref >60)
GFR calc non Af Amer: >60 mL/min (ref >60)
Glucose, Bld: 101 mg/dL — ABNORMAL HIGH (ref 70–99)
Potassium: 5 mmol/L (ref 3.5–5.1)
Sodium: 139 mmol/L (ref 135–145)
Total Bilirubin: 0.4 mg/dL (ref 0.3–1.2)
Total Protein: 4.5 g/dL — ABNORMAL LOW (ref 6.5–8.1)

## 2019-08-26 LAB — GLUCOSE, CAPILLARY
Glucose-Capillary: 101 mg/dL — ABNORMAL HIGH (ref 70–99)
Glucose-Capillary: 109 mg/dL — ABNORMAL HIGH (ref 70–99)
Glucose-Capillary: 122 mg/dL — ABNORMAL HIGH (ref 70–99)
Glucose-Capillary: 299 mg/dL — ABNORMAL HIGH (ref 70–99)
Glucose-Capillary: 94 mg/dL (ref 70–99)

## 2019-08-26 LAB — CBC
HCT: 25.1 % — ABNORMAL LOW (ref 36.0–46.0)
Hemoglobin: 7.7 g/dL — ABNORMAL LOW (ref 12.0–15.0)
MCH: 30 pg (ref 26.0–34.0)
MCHC: 30.7 g/dL (ref 30.0–36.0)
MCV: 97.7 fL (ref 80.0–100.0)
Platelets: 154 10*3/uL (ref 150–400)
RBC: 2.57 MIL/uL — ABNORMAL LOW (ref 3.87–5.11)
RDW: 20.7 % — ABNORMAL HIGH (ref 11.5–15.5)
WBC: 6 10*3/uL (ref 4.0–10.5)
nRBC: 0 % (ref 0.0–0.2)

## 2019-08-26 LAB — PHOSPHORUS: Phosphorus: 1.9 mg/dL — ABNORMAL LOW (ref 2.5–4.6)

## 2019-08-26 MED ORDER — SIMETHICONE 80 MG PO CHEW
160.0000 mg | CHEWABLE_TABLET | Freq: Four times a day (QID) | ORAL | Status: DC
  Administered 2019-08-26 – 2019-08-31 (×21): 160 mg via ORAL
  Filled 2019-08-26 (×22): qty 2

## 2019-08-26 MED ORDER — SODIUM PHOSPHATES 45 MMOLE/15ML IV SOLN
20.0000 mmol | Freq: Once | INTRAVENOUS | Status: DC
  Filled 2019-08-26: qty 6.67

## 2019-08-26 MED ORDER — INSULIN ASPART 100 UNIT/ML ~~LOC~~ SOLN
0.0000 [IU] | Freq: Three times a day (TID) | SUBCUTANEOUS | Status: DC
  Administered 2019-08-26: 1 [IU] via SUBCUTANEOUS

## 2019-08-26 MED ORDER — TRAVASOL 10 % IV SOLN
INTRAVENOUS | Status: AC
  Administered 2019-08-26: 19:00:00 via INTRAVENOUS
  Filled 2019-08-26: qty 691.2

## 2019-08-26 NOTE — Progress Notes (Signed)
Patient Demographics:    Kristine Roberts, is a 63 y.o. female, DOB - [birth year], SWF:[id number]  Admit date - 08/20/2019   Admitting Physician Jani Gravel, MD  Outpatient Primary MD for the patient is [facility]  LOS - 6   Chief Complaint  Patient presents with  . Back Pain        Subjective:    Kristine Roberts no vomiting, appears to be tolerating diet.  Has a lot of gas pain in her abdomen.  Assessment  & Plan :    Principal Problem:   Hypotension Active Problems:   COPD (chronic obstructive pulmonary disease) (HCC)   Protein-calorie malnutrition, severe   Hypokalemia   Cachexia (HCC)   ETOH abuse   Nausea   Abnormal liver function   Hyponatremia   Dehydration  Brief Summary 63 y.o. female,  w h/o [name], Hiatal hernia, Esophagitis, prior gastrojejunostomy Gastric outlet obstruction, PUD, Copd admitted on 08/20/2019 with dehydration and hypotension as well as elevated LFTs in the setting of vomiting and poor oral intake   A/p 1) hypotension--a.m. cortisol level is 15.1,  -echo with preserved EF over 50%. -Troponins are flat at 55 and 53 - suspect hypotension is secondary to dehydration and poor oral intake, continue IV fluids continue to encourage oral intake -I also suspect that with her extremely low BMI of 11, it is very likely that she normally runs a low blood pressure in the 90s.  Overall, blood pressures have improved since admission  2) elevated LFTs--- AST is down to 115 from 555, ALT is down to 321 from 737--- -bilirubin is not elevated, -Lipase is 59 -Acetaminophen level was 42.. -Acute viral hepatitis panel negative -Discussed with Dr. Laural Golden from GI service -CT abdomen and pelvis with status post cholecystectomy otherwise no acute findings from a hepatobiliary standpoint --Elevation of liver enzymes possibly related to dehydration, volume depletion and  ischemic injury.  Overall labs are improving  3) low back pain--patient with endplate compression fractures at L1 L3 and L4 compatible with osteoporosis--- -if back pain is poorly controlled with narcotics consider kyphoplasty  4)FTT/severe protein caloric malnutrition ---- BMI of 11, dietitian consult requested give supplements.  TPN ordered on 9/17  5)H/o Etoh Abuse-lorazepam per CIWA protocol, continue multivitamin including thiamine and folic acid.  No signs of withdrawal at this time  6) COPD and tobacco abuse--- no acute flareup at this time continue bronchodilators  7) hypovolemic hyponatremia--- improved with IV hydration  8) intractable emesis--patient with history of PUD, prior esophagitis and gastric outlet obstruction--- patient underwent EGD on 9/16 which noted gastric outlet obstruction secondary to circumferential gastrojejunal anastomotic ulcer with stenosis.  NG tube was placed for decompression.  Repeat EGD 9/18 with dilation of the pylorus and removal of NG tube.  Per GI, diet has been advanced to soft diet with gastric bypass restrictions.  PICC line has been placed and she is currently on supplemental TPN   9) hypophosphatemia.  Related to diminished p.o. intake.  Replacing phosphorus  10) hypokalemia.  Replaced  Magnesium is 1.7  11) Anemia, suspect this is related to chronic disease. downtrending hemoglobin likely related to hemodilution. No signs of bleeding. Continue to follow and transfuse as needed.  Disposition/Need for in-Hospital Stay-discharge  home once GI work-up is complete.   Code Status : Full  Family Communication:   NA (patient is alert, awake and coherent)   Disposition Plan  : TBD  Consults  :  Gi  DVT Prophylaxis  :   - Heparin - SCDs   Lab Results  Component Value Date   PLT 154 08/26/2019    Inpatient Medications  Scheduled Meds: . Chlorhexidine Gluconate Cloth  6 each Topical Daily  . citalopram  20 mg Oral Daily  . feeding  supplement  1 Container Oral BID BM  . feeding supplement (PRO-STAT SUGAR FREE 64)  30 mL Oral BID  . folic acid  1 mg Oral Daily  . insulin aspart  0-9 Units Subcutaneous Q8H  . lidocaine  1 patch Transdermal Q24H  . mouth rinse  15 mL Mouth Rinse BID  . mometasone-formoterol  1 puff Inhalation BID  . nicotine  21 mg Transdermal Daily  . pantoprazole  40 mg Oral BID AC  . simethicone  160 mg Oral QID  . sodium chloride flush  10-40 mL Intracatheter Q12H  . thiamine  100 mg Oral Daily   Or  . thiamine  100 mg Intravenous Daily  . traZODone  50 mg Oral QHS   Continuous Infusions: . TPN ADULT (ION) 40 mL/hr at 08/25/19 1754  . TPN ADULT (ION)     PRN Meds:.acetaminophen, albuterol, alum & mag hydroxide-simeth, LORazepam, naphazoline-glycerin, ondansetron (ZOFRAN) IV, phenol, sodium chloride flush    Anti-infectives (From admission, onward)   None        Objective:   Vitals:   08/26/19 0741 08/26/19 0800 08/26/19 1200 08/26/19 1315  BP:  104/68 100/75 106/70  Pulse:  78 72 62  Resp:  18 18 19   Temp:  98.9 F (37.2 C) 98 F (36.7 C) 98.3 F (36.8 C)  TempSrc:  Oral Oral Oral  SpO2: 98% 100% 100% 99%  Weight:      Height:        Wt Readings from Last 3 Encounters:  08/24/19 34 kg  09/18/18 31.8 kg  08/20/18 31.8 kg     Intake/Output Summary (Last 24 hours) at 08/26/2019 1700 Last data filed at 08/26/2019 1500 Gross per 24 hour  Intake 1078.89 ml  Output 950 ml  Net 128.89 ml     Physical Exam  General exam: Alert, awake, oriented x 3 Respiratory system: Clear to auscultation. Respiratory effort normal. Cardiovascular system:RRR. No murmurs, rubs, gallops. Gastrointestinal system: Abdomen is nondistended, soft and nontender. No organomegaly or masses felt. Normal bowel sounds heard. Central nervous system: Alert and oriented. No focal neurological deficits. Extremities: No C/C/E, +pedal pulses Skin: No rashes, lesions or ulcers Psychiatry: Judgement and  insight appear normal. Mood & affect appropriate.     Data Review:   Micro Results Recent Results (from the past 240 hour(s))  SARS Coronavirus 2 [facility] order, Performed in [facility] hospital lab) Nasopharyngeal Nasopharyngeal Swab     Status: None   Collection Time: 08/20/19  3:25 PM   Specimen: Nasopharyngeal Swab  Result Value Ref Range Status   SARS Coronavirus 2 NEGATIVE NEGATIVE Final    Comment: (NOTE) If result is NEGATIVE SARS-CoV-2 target nucleic acids are NOT DETECTED. The SARS-CoV-2 RNA is generally detectable in upper and lower  respiratory specimens during the acute phase of infection. The lowest  concentration of SARS-CoV-2 viral copies this assay can detect is 250  copies / mL. A negative result does not preclude SARS-CoV-2 infection  and should not be used as the sole basis for treatment or other  patient management decisions.  A negative result may occur with  improper specimen collection / handling, submission of specimen other  than nasopharyngeal swab, presence of viral mutation(s) within the  areas targeted by this assay, and inadequate number of viral copies  (<250 copies / mL). A negative result must be combined with clinical  observations, patient history, and epidemiological information. If result is POSITIVE SARS-CoV-2 target nucleic acids are DETECTED. The SARS-CoV-2 RNA is generally detectable in upper and lower  respiratory specimens dur ing the acute phase of infection.  Positive  results are indicative of active infection with SARS-CoV-2.  Clinical  correlation with patient history and other diagnostic information is  necessary to determine patient infection status.  Positive results do  not rule out bacterial infection or co-infection with other viruses. If result is PRESUMPTIVE POSTIVE SARS-CoV-2 nucleic acids MAY BE PRESENT.   A presumptive positive result was obtained on the submitted specimen  and confirmed on repeat testing.  While 2019  novel coronavirus  (SARS-CoV-2) nucleic acids may be present in the submitted sample  additional confirmatory testing may be necessary for epidemiological  and / or clinical management purposes  to differentiate between  SARS-CoV-2 and other Sarbecovirus currently known to infect humans.  If clinically indicated additional testing with an alternate test  methodology [phone]([id number]) is advised. The SARS-CoV-2 RNA is generally  detectable in upper and lower respiratory sp ecimens during the acute  phase of infection. The expected result is Negative. Fact Sheet for Patients:  BoilerBrush.com.cyhttps://www.fda.gov/media/136312/download Fact Sheet for Healthcare Providers: https://pope.com/https://www.fda.gov/media/136313/download This test is not yet approved or cleared by the [place] FDA and has been authorized for detection and/or diagnosis of SARS-CoV-2 by FDA under an Emergency Use Authorization (EUA).  This EUA will remain in effect (meaning this test can be used) for the duration of the COVID-19 declaration under Section 564(b)(1) of the Act, 21 U.S.C. section 360bbb-3(b)(1), unless the authorization is terminated or revoked sooner. Performed at [facility], [address] [place], [place] [address]   MRSA PCR Screening     Status: None   Collection Time: 08/20/19  6:27 PM   Specimen: Nasal Mucosa; Nasopharyngeal  Result Value Ref Range Status   MRSA by PCR NEGATIVE NEGATIVE Final    Comment:        The GeneXpert MRSA Assay (FDA approved for NASAL specimens only), is one component of a comprehensive MRSA colonization surveillance program. It is not intended to diagnose MRSA infection nor to guide or monitor treatment for MRSA infections. Performed at [facility], [address] [place], [place] [address]     Radiology Reports Ct Abdomen Pelvis Wo Contrast  Result Date: 08/20/2019 CLINICAL DATA:  Back pain. Nausea and vomiting. Weight loss. Abnormal liver function. EXAM: CT ABDOMEN AND PELVIS WITHOUT  CONTRAST TECHNIQUE: Multidetector CT imaging of the abdomen and pelvis was performed following the standard protocol without IV contrast. COMPARISON:  Multiple exams, including 07/14/2018 and 10/25/2014 FINDINGS: Lower chest: 5 by 4 mm right lower lobe pulmonary nodule along the diaphragm, image 15/4, no change from 10/25/2014, considered benign. Calcified granuloma in the left lower lobe on image 7/4, benign. Hepatobiliary: Cholecystectomy.  Otherwise unremarkable. Pancreas: Considerable atrophy. Spleen: Unremarkable Adrenals/Urinary Tract: Hypodense 1.8 cm left kidney upper pole lesion, likely a cyst, previously 1.6 cm. Adrenal glands unremarkable. No urinary tract calculi are identified. Stomach/Bowel: Postoperative findings including gastrojejunostomy at not stenotic staple lines in  upper pelvic loops of bowel Vascular/Lymphatic: Aortoiliac atherosclerotic vascular disease. No well-defined adenopathy. Reproductive: Uterus is thought to be absent.  Adnexa unremarkable. Other: Severe paucity of adipose tissue favoring cachexia. This makes separation of adjacent abdominal structures such as bowel loops problematic. Musculoskeletal: Right hip screw. Bony demineralization. Interval mild inferior endplate compression fractures at L3 and L4. Interval mild superior endplate compression fracture at L1. Old superior endplate compression fracture L5. IMPRESSION: 1. Interval endplate compression fractures at L1, L3, and L4, compatible with osteoporosis. 2. Prior gastrojejunostomy, along with anastomotic staple line along what is likely a small bowel loop in the pelvis. The bowel loops are difficult to separate due to the patient's striking paucity of bodily adipose tissue including intra-abdominal adipose tissue. There is formed stool in the colon and I do not see definite dilated bowel. 3. Left kidney upper pole cyst.  No definite urinary tract calculi. 4.  Aortic Atherosclerosis (ICD10-I70.0). Electronically Signed   By:  Gaylyn Rong M.D.   On: 08/20/2019 17:18   Dg Abdomen Acute W/chest  Result Date: 08/20/2019 CLINICAL DATA:  Abdominal pain EXAM: DG ABDOMEN ACUTE W/ 1V CHEST COMPARISON:  Abdomen series December 26, 2017 FINDINGS: PA chest: Lungs are hyperexpanded. There are several scattered small calcified granulomas. There is no edema or consolidation. Heart size and pulmonary vascularity are normal. No adenopathy. There old healed rib fractures bilaterally. There is midthoracic dextroscoliosis. Supine and upright abdomen: There are multiple surgical clips in the abdomen and pelvis. There is moderate stool in the colon. There is no bowel dilatation or air-fluid level to suggest bowel obstruction. No free air. Postoperative changes noted in the proximal right femur. Bones are osteoporotic. IMPRESSION: No bowel obstruction or free air. Moderate stool in colon. Extensive postoperative change in the abdomen and pelvis. Lungs mildly hyperexpanded without edema or consolidation. Scattered calcified granulomas noted. Electronically Signed   By: Bretta Bang III M.D.   On: 08/20/2019 13:23   [place] Ekg Site Rite  Result Date: 08/22/2019 If Site Rite image not attached, placement could not be confirmed due to current cardiac rhythm.    CBC Recent Labs  Lab 08/20/19 1249  08/22/19 0503 08/23/19 0423 08/24/19 0410 08/25/19 0441 08/26/19 0705  WBC 4.0   < > 3.6* 6.0 8.0 8.4 6.0  HGB 12.7   < > 10.0* 8.5* 7.9* 8.2* 7.7*  HCT 39.2   < > 31.6* 27.0* 25.2* 26.0* 25.1*  PLT 318   < > 220 175 164 172 154  MCV 87.7   < > 92.4 93.4 94.7 94.2 97.7  MCH 28.4   < > 29.2 29.4 29.7 29.7 30.0  MCHC 32.4   < > 31.6 31.5 31.3 31.5 30.7  RDW 20.4*   < > 20.7* 21.1* 21.1* 21.0* 20.7*  LYMPHSABS 1.0  --   --   --  1.8 1.3  --   MONOABS 0.6  --   --   --  0.4 0.3  --   EOSABS 0.0  --   --   --  0.1 0.1  --   BASOSABS 0.0  --   --   --  0.0 0.0  --    < > = values in this interval not displayed.    Chemistries  Recent  Labs  Lab 08/22/19 0503 08/23/19 0423 08/24/19 0410 08/25/19 0441 08/26/19 0705  NA 139 140 144 143 139  K 4.4 3.4* 3.6 3.9 5.0  CL 111 111 117* 116* 112*  CO2 25 23  22 23 24   GLUCOSE 95 98 100* 112* 101*  BUN 26* 12 9 9 11   CREATININE 0.56 0.43* 0.38* 0.36* 0.32*  CALCIUM 7.6* 7.0* 6.8* 7.0* 7.3*  MG 1.8  --  1.2* 1.7  --   AST 115* 61* 41 32 40  ALT 321* 192* 134* 113* 95*  ALKPHOS 71 52 50 58 53  BILITOT 0.8 1.0 0.8 1.0 0.4   ------------------------------------------------------------------------------------------------------------------ Recent Labs    08/24/19 0410  TRIG 25    No results found for: HGBA1C ------------------------------------------------------------------------------------------------------------------ No results for input(s): TSH, T4TOTAL, T3FREE, THYROIDAB in the last 72 hours.  Invalid input(s): FREET3 ------------------------------------------------------------------------------------------------------------------ No results for input(s): VITAMINB12, FOLATE, FERRITIN, TIBC, IRON, RETICCTPCT in the last 72 hours.  Coagulation profile Recent Labs  Lab 08/22/19 0503  INR 1.0    No results for input(s): DDIMER in the last 72 hours.  Cardiac Enzymes Recent Labs  Lab 08/20/19 1450  CKMB 4.5   ------------------------------------------------------------------------------------------------------------------ No results found for: BNP   Erick BlinksJehanzeb Leniya Breit M.D on 08/26/2019 at 5:00 PM  Go to [email or web address] - for contact info  [facility] - Office  [phone]

## 2019-08-26 NOTE — Progress Notes (Signed)
  Assessment/Plan: ADMITTED WITH NAUSEA/VOMITING. CLINICALLY IMPROVED.  PLAN: 1. CONTINUE TPN  AND PT WILL LIKELY NEED FOR 1 MONTH. PT WILL DISCUSS WITH DR. Laural Golden TOMORROW. 2. DYSPHAGIA 3/GASTRIC BYPASS DIET.   Subjective: Since I last evaluated the patient SHE IS TOLERATING DYSPHAGIA 3/GASTRIC BYPASS DIET FAIRLY WELL. VOMITED x 1(BLACK, NO BRB). FOOD GOING DOWN PRETTY GOOD. HAS PAIN IN LEFT LATERAL CHEST/BACK.  Objective: Vital signs in last 24 hours: Vitals:   08/26/19 0741 08/26/19 0800  BP:  104/68  Pulse:  78  Resp:  18  Temp:  98.9 F (37.2 C)  SpO2: 98% 100%   General appearance: alert, cooperative and no distress Resp: clear to auscultation bilaterally Cardio: regular rate and rhythm GI: soft, non-tender; bowel sounds normal;  Lab Results:  K 5.0 Cr 0.32 ALT 95 AST 40 Hb 7.7  Studies/Results: No results found.  Medications: I have reviewed the patient's current medications.

## 2019-08-26 NOTE — Progress Notes (Signed)
Started patient on flutter device for congested cough

## 2019-08-26 NOTE — Progress Notes (Addendum)
PHARMACY - ADULT TOTAL PARENTERAL NUTRITION CONSULT NOTE   Pharmacy Consult for TPN Indication: NPO=>Malnutrition and Gastric Outlet Obstruction  Patient Measurements: Height: 5' (152.4 cm) Weight: 74 lb 15.3 oz (34 kg) IBW/kg (Calculated) : 45.5 TPN AdjBW (KG): 34 Body mass index is 14.64 kg/m. Usual Weight: 34kg  Assessment: Gastric outlet obstruction secondary to circumferential gastrojejunal anastomotic ulcer with stenosis . Pt with no complaints and and no N/V. No signs of infection. Will initiate lipids along with CHO/Pro slowly d/t concern for refeeding syndrome. Glucose stable, magnesium to be replaced, phosphorus to be replaced.  Repeat EGD 9/18 with dilation of the pylorus and removal of NG tube.  She was started on clear liquids.  Advancing diet per GI.  GI: GOO Endo: EGD with dilatation 9/18  Insulin requirements in the past 24 hours: none Lytes:  K 3.6>>3.9>>5 (replaced), Phos 1.2>>1.7>> 1.9, mag 1.2>> 1.7 Renal:stable Hepatobil: T.BILI within nl. LFTS improved ID: no infection  TPN Access:PICC double lumen TPN start date:08/24/2019 Nutritional Goals (per RD recommendation on 9/18): DTOI:[id number] (40-45 kcal/kg) Protein: 61-75 grams protein(1.8-2.2g/kg) Fluid:1.4-1.5L  Goal TPN rate is 60 ml/hr   Current Nutrition: clear liquis  Plan:  Increase TPN at 105mL/hr. This TPN provides 69g of protein, 216g of dextrose, and 43g of lipids which provides 1441 kCals per day, meeting ~100% of patient needs Electrolytes in TPN: reduced K, increased Phosphate Add MVI, trace elements to TPN TID SSI and adjust as needed Make total  IVMF100 ml/hr Monitor TPN labs F/U labs,  Isac Sarna, BS Pharm D, BCPS Clinical Pharmacist Pager [phone] 08/26/2019,8:49 AM   Addendum: unable to order Na Phosphate due to national backorder shortage. Phosphate increased in TPN. Phosphate trending up

## 2019-08-27 ENCOUNTER — Encounter (HOSPITAL_COMMUNITY): Payer: Self-pay | Admitting: Internal Medicine

## 2019-08-27 DIAGNOSIS — R1013 Epigastric pain: Secondary | ICD-10-CM

## 2019-08-27 DIAGNOSIS — R945 Abnormal results of liver function studies: Secondary | ICD-10-CM

## 2019-08-27 DIAGNOSIS — K219 Gastro-esophageal reflux disease without esophagitis: Secondary | ICD-10-CM

## 2019-08-27 LAB — MAGNESIUM: Magnesium: 1.9 mg/dL (ref 1.7–2.4)

## 2019-08-27 LAB — CBC
HCT: 25.4 % — ABNORMAL LOW (ref 36.0–46.0)
Hemoglobin: 7.9 g/dL — ABNORMAL LOW (ref 12.0–15.0)
MCH: 30.3 pg (ref 26.0–34.0)
MCHC: 31.1 g/dL (ref 30.0–36.0)
MCV: 97.3 fL (ref 80.0–100.0)
Platelets: 164 10*3/uL (ref 150–400)
RBC: 2.61 MIL/uL — ABNORMAL LOW (ref 3.87–5.11)
RDW: 20.7 % — ABNORMAL HIGH (ref 11.5–15.5)
WBC: 5 10*3/uL (ref 4.0–10.5)
nRBC: 0 % (ref 0.0–0.2)

## 2019-08-27 LAB — GLUCOSE, CAPILLARY
Glucose-Capillary: 104 mg/dL — ABNORMAL HIGH (ref 70–99)
Glucose-Capillary: 106 mg/dL — ABNORMAL HIGH (ref 70–99)
Glucose-Capillary: 85 mg/dL (ref 70–99)
Glucose-Capillary: 97 mg/dL (ref 70–99)
Glucose-Capillary: 98 mg/dL (ref 70–99)

## 2019-08-27 LAB — DIFFERENTIAL
Abs Immature Granulocytes: 0.01 10*3/uL (ref 0.00–0.07)
Basophils Absolute: 0 10*3/uL (ref 0.0–0.1)
Basophils Relative: 1 %
Eosinophils Absolute: 0.1 10*3/uL (ref 0.0–0.5)
Eosinophils Relative: 2 %
Immature Granulocytes: 0 %
Lymphocytes Relative: 30 %
Lymphs Abs: 1.5 10*3/uL (ref 0.7–4.0)
Monocytes Absolute: 0.6 10*3/uL (ref 0.1–1.0)
Monocytes Relative: 11 %
Neutro Abs: 2.8 10*3/uL (ref 1.7–7.7)
Neutrophils Relative %: 56 %

## 2019-08-27 LAB — COMPREHENSIVE METABOLIC PANEL
ALT: 79 U/L — ABNORMAL HIGH (ref 0–44)
AST: 37 U/L (ref 15–41)
Albumin: 2.5 g/dL — ABNORMAL LOW (ref 3.5–5.0)
Alkaline Phosphatase: 45 U/L (ref 38–126)
Anion gap: 4 — ABNORMAL LOW (ref 5–15)
BUN: 15 mg/dL (ref 8–23)
CO2: 24 mmol/L (ref 22–32)
Calcium: 7.7 mg/dL — ABNORMAL LOW (ref 8.9–10.3)
Chloride: 109 mmol/L (ref 98–111)
Creatinine, Ser: 0.35 mg/dL — ABNORMAL LOW (ref 0.44–1.00)
GFR calc Af Amer: >60 mL/min (ref >60)
GFR calc non Af Amer: >60 mL/min (ref >60)
Glucose, Bld: 96 mg/dL (ref 70–99)
Potassium: 4.8 mmol/L (ref 3.5–5.1)
Sodium: 137 mmol/L (ref 135–145)
Total Bilirubin: 0.4 mg/dL (ref 0.3–1.2)
Total Protein: 4.6 g/dL — ABNORMAL LOW (ref 6.5–8.1)

## 2019-08-27 LAB — PREALBUMIN: Prealbumin: 13.5 mg/dL — ABNORMAL LOW (ref 18–38)

## 2019-08-27 LAB — TRIGLYCERIDES: Triglycerides: 56 mg/dL (ref <150)

## 2019-08-27 LAB — PHOSPHORUS: Phosphorus: 2.6 mg/dL (ref 2.5–4.6)

## 2019-08-27 MED ORDER — TRAVASOL 10 % IV SOLN
INTRAVENOUS | Status: AC
  Administered 2019-08-27: 18:00:00 via INTRAVENOUS
  Filled 2019-08-27: qty 691.2

## 2019-08-27 MED ORDER — METOCLOPRAMIDE HCL 5 MG/ML IJ SOLN
5.0000 mg | Freq: Three times a day (TID) | INTRAMUSCULAR | Status: DC
  Administered 2019-08-27: 16:00:00 5 mg via INTRAVENOUS
  Filled 2019-08-27 (×2): qty 2

## 2019-08-27 MED ORDER — PANTOPRAZOLE SODIUM 40 MG PO PACK
40.0000 mg | PACK | Freq: Two times a day (BID) | ORAL | Status: DC
  Administered 2019-08-27 – 2019-08-28 (×2): 40 mg
  Filled 2019-08-27 (×2): qty 20

## 2019-08-27 NOTE — Progress Notes (Signed)
  Subjective:  Patient continues to complain of frequent heartburn.  She also complains of burning pain across upper abdomen.  She did vomits once the day she had the procedure.  Objective: Blood pressure (!) 78/53, pulse (!) 124, temperature 98.7 F (37.1 C), temperature source Oral, resp. rate 18, height 5' (1.524 m), weight 34 kg, SpO2 100 %. Patient is alert and in no acute distress. Abdomen is flat with mild midepigastric tenderness.  No organomegaly or masses.  Labs/studies Results:  CBC Latest Ref Rng & Units 08/27/2019 08/26/2019 08/25/2019  WBC 4.0 - 10.5 K/uL 5.0 6.0 8.4  Hemoglobin 12.0 - 15.0 g/dL 7.9(L) 7.7(L) 8.2(L)  Hematocrit 36.0 - 46.0 % 25.4(L) 25.1(L) 26.0(L)  Platelets 150 - 400 K/uL 164 154 172    CMP Latest Ref Rng & Units 08/27/2019 08/26/2019 08/25/2019  Glucose 70 - 99 mg/dL 96 101(H) 112(H)  BUN 8 - 23 mg/dL '15 11 9  '$ Creatinine 0.44 - 1.00 mg/dL 0.35(L) 0.32(L) 0.36(L)  Sodium 135 - 145 mmol/L 137 139 143  Potassium 3.5 - 5.1 mmol/L 4.8 5.0 3.9  Chloride 98 - 111 mmol/L 109 112(H) 116(H)  CO2 22 - 32 mmol/L '24 24 23  '$ Calcium 8.9 - 10.3 mg/dL 7.7(L) 7.3(L) 7.0(L)  Total Protein 6.5 - 8.1 g/dL 4.6(L) 4.5(L) 4.5(L)  Total Bilirubin 0.3 - 1.2 mg/dL 0.4 0.4 1.0  Alkaline Phos 38 - 126 U/L 45 53 58  AST 15 - 41 U/L 37 40 32  ALT 0 - 44 U/L 79(H) 95(H) 113(H)    Hepatic Function Latest Ref Rng & Units 08/27/2019 08/26/2019 08/25/2019  Total Protein 6.5 - 8.1 g/dL 4.6(L) 4.5(L) 4.5(L)  Albumin 3.5 - 5.0 g/dL 2.5(L) 2.5(L) 2.6(L)  AST 15 - 41 U/L 37 40 32  ALT 0 - 44 U/L 79(H) 95(H) 113(H)  Alk Phosphatase 38 - 126 U/L 45 53 58  Total Bilirubin 0.3 - 1.2 mg/dL 0.4 0.4 1.0      Assessment:  #1.  Gastric outlet secondary to high-grade gastrojejunal anastomotic stricture with an ulceration.  Patient was treated with NG suction for 2 days followed by therapeutic EGD on 08/24/2019.  Stricture was initially traversed with ultra slim scope and eventually dilated to 12  mm.  Her diet has been advanced with I do not believe she is tolerating it as she is having upper abdominal pain and heartburn.  She needs to be back on no more than full liquids for timely.  She may also have an element of gastroparesis given history of gastric surgery.  She would benefit from low-dose metoclopramide.  #2.  GERD.  Recent EGD revealed grade C reflux esophagitis.  She is having frequent heartburn.  Pantoprazole may not be working.  Preparation will be changed to liquid as discussed with pharmacist.  #3.  Severe malnutrition.  Patient is on parenteral nutrition.  #4.  Anemia appears to be multifactorial.  No active bleeding.  No indication for blood transfusion.   Recommendations  Metoclopramide 5 mg IV 3 times daily for 24 hours after which will be changed to oral route. Pantoprazole prescription to be changed to liquid as pill may not be leaving her stomach. Diet changed to full liquids. Consider physical therapy evaluation. Patient will need to continue TPN for at least 4 weeks at the time of her next EGD/dilation.

## 2019-08-27 NOTE — Plan of Care (Signed)
  Problem: Acute Rehab PT Goals(only PT should resolve) Goal: Pt Will Go Supine/Side To Sit Outcome: Progressing Flowsheets (Taken 08/27/2019 1626) Pt will go Supine/Side to Sit: Independently Goal: Patient Will Transfer Sit To/From Stand Outcome: Progressing Flowsheets (Taken 08/27/2019 1626) Patient will transfer sit to/from stand: Independently Goal: Pt Will Transfer Bed To Chair/Chair To Bed Outcome: Progressing Flowsheets (Taken 08/27/2019 1626) Pt will Transfer Bed to Chair/Chair to Bed: Independently Goal: Pt Will Ambulate Outcome: Progressing Flowsheets (Taken 08/27/2019 1626) Pt will Ambulate:  > 125 feet  with modified independence   4:26 PM, 08/27/19 Lonell Grandchild, MPT Physical Therapist with [facility] [phone] office [phone] mobile phone

## 2019-08-27 NOTE — Progress Notes (Signed)
Patient Demographics:    Kristine Roberts, is a 63 y.o. female, DOB - [birth year], IDP:[id number]  Admit date - 08/20/2019   Admitting Physician Jani Gravel, MD  Outpatient Primary MD for the patient is [facility] [address]   Chief Complaint  Patient presents with   Back Pain        Subjective:    [name] no vomiting, has some nausea.  Abdominal pain is controlled.  Assessment  & Plan :    Principal Problem:   Hypotension Active Problems:   COPD (chronic obstructive pulmonary disease) (HCC)   Protein-calorie malnutrition, severe   Hypokalemia   Cachexia (HCC)   ETOH abuse   Nausea   Abnormal liver function   Hyponatremia   Dehydration  Brief Summary 63 y.o. female,  w h/o Kristine Roberts, Hiatal hernia, Esophagitis, prior gastrojejunostomy Gastric outlet obstruction, PUD, Copd admitted on 08/20/2019 with dehydration and hypotension as well as elevated LFTs in the setting of vomiting and poor oral intake   A/p 1) hypotension--a.m. cortisol level is 15.1,  -echo with preserved EF over 50%. -Troponins are flat at 55 and 53 - suspect hypotension is secondary to dehydration and poor oral intake, continue IV fluids continue to encourage oral intake -I also suspect that with her extremely low BMI of 11, it is very likely that she normally runs a low blood pressure in the 90s.  Overall, blood pressures have improved since admission  2) elevated LFTs--- AST is down to 115 from 555, ALT is down to 321 from 737--- -bilirubin is not elevated, -Lipase is 59 -Acetaminophen level was 42.. -Acute viral hepatitis panel negative -Discussed with Dr. Laural Golden from GI service -CT abdomen and pelvis with status post cholecystectomy otherwise no acute findings from a hepatobiliary standpoint --Elevation of liver enzymes possibly related to dehydration, volume depletion and ischemic injury.  Overall  labs are improving  3) low back pain--patient with endplate compression fractures at L1 L3 and L4 compatible with osteoporosis--- -if back pain is poorly controlled with narcotics consider kyphoplasty  4)FTT/severe protein caloric malnutrition ---- BMI of 11, dietitian consult requested give supplements.  TPN ordered on 9/17  5)H/o Etoh Abuse-lorazepam per CIWA protocol, continue multivitamin including thiamine and folic acid.  No signs of withdrawal at this time  6) COPD and tobacco abuse--- no acute flareup at this time continue bronchodilators  7) hypovolemic hyponatremia--- improved with IV hydration  8) intractable emesis--patient with history of PUD, prior esophagitis and gastric outlet obstruction--- patient underwent EGD on 9/16 which noted gastric outlet obstruction secondary to circumferential gastrojejunal anastomotic ulcer with stenosis.  NG tube was placed for decompression.  Repeat EGD 9/18 with dilation of the pylorus and removal of NG tube.  Per GI, she is currently on liquid diet.  PICC line has been placed and she is currently on supplemental TPN  9) hypophosphatemia.  Related to diminished p.o. intake.  Replacing phosphorus  10) hypokalemia.  Replaced  Magnesium is 1.7  11) Anemia, suspect this is related to chronic disease. downtrending hemoglobin likely related to hemodilution. No signs of bleeding. Continue to follow and transfuse as needed.  Disposition/Need for in-Hospital Stay-discharge home once GI work-up is complete.  Potentially in the next 24 hours.  Code Status : Full  Family Communication:   NA (patient is alert, awake and coherent)   Disposition Plan  : TBD  Consults  :  Gi  DVT Prophylaxis  :   - Heparin - SCDs   Lab Results  Component Value Date   PLT 164 08/27/2019    Inpatient Medications  Scheduled Meds:  Chlorhexidine Gluconate Cloth  6 each Topical Daily   citalopram  20 mg Oral Daily   feeding supplement  1 Container Oral BID BM     feeding supplement (PRO-STAT SUGAR FREE 64)  30 mL Oral BID   folic acid  1 mg Oral Daily   insulin aspart  0-9 Units Subcutaneous Q8H   lidocaine  1 patch Transdermal Q24H   mouth rinse  15 mL Mouth Rinse BID   metoCLOPramide (REGLAN) injection  5 mg Intravenous TID AC   mometasone-formoterol  1 puff Inhalation BID   nicotine  21 mg Transdermal Daily   pantoprazole sodium  40 mg Per Tube BID   simethicone  160 mg Oral QID   sodium chloride flush  10-40 mL Intracatheter Q12H   thiamine  100 mg Oral Daily   Or   thiamine  100 mg Intravenous Daily   traZODone  50 mg Oral QHS   Continuous Infusions:  TPN ADULT (ION) 60 mL/hr at 08/27/19 1814   PRN Meds:.acetaminophen, albuterol, alum & mag hydroxide-simeth, LORazepam, naphazoline-glycerin, ondansetron (ZOFRAN) IV, phenol, sodium chloride flush    Anti-infectives (From admission, onward)   None        Objective:   Vitals:   08/27/19 0753 08/27/19 0800 08/27/19 1014 08/27/19 1417  BP:   (!) 78/53 106/61  Pulse:   (!) 124 65  Resp:   18 19  Temp:   98.7 F (37.1 C) 98.3 F (36.8 C)  TempSrc:   Oral Oral  SpO2: 98% 100% 100% 100%  Weight:      Height:        Wt Readings from Last 3 Encounters:  08/24/19 34 kg  09/18/18 31.8 kg  08/20/18 31.8 kg     Intake/Output Summary (Last 24 hours) at 08/27/2019 1850 Last data filed at 08/27/2019 1551 Gross per 24 hour  Intake 1238.79 ml  Output --  Net 1238.79 ml     Physical Exam  General exam: Alert, awake, oriented x 3 Respiratory system: Clear to auscultation. Respiratory effort normal. Cardiovascular system:RRR. No murmurs, rubs, gallops. Gastrointestinal system: Abdomen is nondistended, soft and nontender. No organomegaly or masses felt. Normal bowel sounds heard. Central nervous system: Alert and oriented. No focal neurological deficits. Extremities: No C/C/E, +pedal pulses Skin: No rashes, lesions or ulcers Psychiatry: Judgement and insight  appear normal. Mood & affect appropriate.     Data Review:   Micro Results Recent Results (from the past 240 hour(s))  SARS Coronavirus 2 [facility] order, Performed in [facility] hospital lab) Nasopharyngeal Nasopharyngeal Swab     Status: None   Collection Time: 08/20/19  3:25 PM   Specimen: Nasopharyngeal Swab  Result Value Ref Range Status   SARS Coronavirus 2 NEGATIVE NEGATIVE Final    Comment: (NOTE) If result is NEGATIVE SARS-CoV-2 target nucleic acids are NOT DETECTED. The SARS-CoV-2 RNA is generally detectable in upper and lower  respiratory specimens during the acute phase of infection. The lowest  concentration of SARS-CoV-2 viral copies this assay can detect is 250  copies / mL. A negative result does not preclude SARS-CoV-2 infection  and should not  be used as the sole basis for treatment or other  patient management decisions.  A negative result may occur with  improper specimen collection / handling, submission of specimen other  than nasopharyngeal swab, presence of viral mutation(s) within the  areas targeted by this assay, and inadequate number of viral copies  (<250 copies / mL). A negative result must be combined with clinical  observations, patient history, and epidemiological information. If result is POSITIVE SARS-CoV-2 target nucleic acids are DETECTED. The SARS-CoV-2 RNA is generally detectable in upper and lower  respiratory specimens dur ing the acute phase of infection.  Positive  results are indicative of active infection with SARS-CoV-2.  Clinical  correlation with patient history and other diagnostic information is  necessary to determine patient infection status.  Positive results do  not rule out bacterial infection or co-infection with other viruses. If result is PRESUMPTIVE POSTIVE SARS-CoV-2 nucleic acids MAY BE PRESENT.   A presumptive positive result was obtained on the submitted specimen  and confirmed on repeat testing.  While 2019 novel  coronavirus  (SARS-CoV-2) nucleic acids may be present in the submitted sample  additional confirmatory testing may be necessary for epidemiological  and / or clinical management purposes  to differentiate between  SARS-CoV-2 and other Sarbecovirus currently known to infect humans.  If clinically indicated additional testing with an alternate test  methodology [phone]([id number]) is advised. The SARS-CoV-2 RNA is generally  detectable in upper and lower respiratory sp ecimens during the acute  phase of infection. The expected result is Negative. Fact Sheet for Patients:  BoilerBrush.com.cyhttps://www.fda.gov/media/136312/download Fact Sheet for Healthcare Providers: https://pope.com/https://www.fda.gov/media/136313/download This test is not yet approved or cleared by the [place] FDA and has been authorized for detection and/or diagnosis of SARS-CoV-2 by FDA under an Emergency Use Authorization (EUA).  This EUA will remain in effect (meaning this test can be used) for the duration of the COVID-19 declaration under Section 564(b)(1) of the Act, 21 U.S.C. section 360bbb-3(b)(1), unless the authorization is terminated or revoked sooner. Performed at [facility], [address] [place], [place] [address]   MRSA PCR Screening     Status: None   Collection Time: 08/20/19  6:27 PM   Specimen: Nasal Mucosa; Nasopharyngeal  Result Value Ref Range Status   MRSA by PCR NEGATIVE NEGATIVE Final    Comment:        The GeneXpert MRSA Assay (FDA approved for NASAL specimens only), is one component of a comprehensive MRSA colonization surveillance program. It is not intended to diagnose MRSA infection nor to guide or monitor treatment for MRSA infections. Performed at [facility], [address] [place], [place] [address]     Radiology Reports Ct Abdomen Pelvis Wo Contrast  Result Date: 08/20/2019 CLINICAL DATA:  Back pain. Nausea and vomiting. Weight loss. Abnormal liver function. EXAM: CT ABDOMEN AND PELVIS WITHOUT CONTRAST  TECHNIQUE: Multidetector CT imaging of the abdomen and pelvis was performed following the standard protocol without IV contrast. COMPARISON:  Multiple exams, including 07/14/2018 and 10/25/2014 FINDINGS: Lower chest: 5 by 4 mm right lower lobe pulmonary nodule along the diaphragm, image 15/4, no change from 10/25/2014, considered benign. Calcified granuloma in the left lower lobe on image 7/4, benign. Hepatobiliary: Cholecystectomy.  Otherwise unremarkable. Pancreas: Considerable atrophy. Spleen: Unremarkable Adrenals/Urinary Tract: Hypodense 1.8 cm left kidney upper pole lesion, likely a cyst, previously 1.6 cm. Adrenal glands unremarkable. No urinary tract calculi are identified. Stomach/Bowel: Postoperative findings including gastrojejunostomy at not stenotic staple lines in upper pelvic loops  of bowel Vascular/Lymphatic: Aortoiliac atherosclerotic vascular disease. No well-defined adenopathy. Reproductive: Uterus is thought to be absent.  Adnexa unremarkable. Other: Severe paucity of adipose tissue favoring cachexia. This makes separation of adjacent abdominal structures such as bowel loops problematic. Musculoskeletal: Right hip screw. Bony demineralization. Interval mild inferior endplate compression fractures at L3 and L4. Interval mild superior endplate compression fracture at L1. Old superior endplate compression fracture L5. IMPRESSION: 1. Interval endplate compression fractures at L1, L3, and L4, compatible with osteoporosis. 2. Prior gastrojejunostomy, along with anastomotic staple line along what is likely a small bowel loop in the pelvis. The bowel loops are difficult to separate due to the patient's striking paucity of bodily adipose tissue including intra-abdominal adipose tissue. There is formed stool in the colon and I do not see definite dilated bowel. 3. Left kidney upper pole cyst.  No definite urinary tract calculi. 4.  Aortic Atherosclerosis (ICD10-I70.0). Electronically Signed   By: Gaylyn Rong M.D.   On: 08/20/2019 17:18   Dg Abdomen Acute W/chest  Result Date: 08/20/2019 CLINICAL DATA:  Abdominal pain EXAM: DG ABDOMEN ACUTE W/ 1V CHEST COMPARISON:  Abdomen series December 26, 2017 FINDINGS: PA chest: Lungs are hyperexpanded. There are several scattered small calcified granulomas. There is no edema or consolidation. Heart size and pulmonary vascularity are normal. No adenopathy. There old healed rib fractures bilaterally. There is midthoracic dextroscoliosis. Supine and upright abdomen: There are multiple surgical clips in the abdomen and pelvis. There is moderate stool in the colon. There is no bowel dilatation or air-fluid level to suggest bowel obstruction. No free air. Postoperative changes noted in the proximal right femur. Bones are osteoporotic. IMPRESSION: No bowel obstruction or free air. Moderate stool in colon. Extensive postoperative change in the abdomen and pelvis. Lungs mildly hyperexpanded without edema or consolidation. Scattered calcified granulomas noted. Electronically Signed   By: Bretta Bang III M.D.   On: 08/20/2019 13:23   [place] Ekg Site Rite  Result Date: 08/22/2019 If Site Rite image not attached, placement could not be confirmed due to current cardiac rhythm.    CBC Recent Labs  Lab 08/23/19 0423 08/24/19 0410 08/25/19 0441 08/26/19 0705 08/27/19 0624  WBC 6.0 8.0 8.4 6.0 5.0  HGB 8.5* 7.9* 8.2* 7.7* 7.9*  HCT 27.0* 25.2* 26.0* 25.1* 25.4*  PLT 175 164 172 154 164  MCV 93.4 94.7 94.2 97.7 97.3  MCH 29.4 29.7 29.7 30.0 30.3  MCHC 31.5 31.3 31.5 30.7 31.1  RDW 21.1* 21.1* 21.0* 20.7* 20.7*  LYMPHSABS  --  1.8 1.3  --  1.5  MONOABS  --  0.4 0.3  --  0.6  EOSABS  --  0.1 0.1  --  0.1  BASOSABS  --  0.0 0.0  --  0.0    Chemistries  Recent Labs  Lab 08/22/19 0503 08/23/19 0423 08/24/19 0410 08/25/19 0441 08/26/19 0705 08/27/19 0624  NA 139 140 144 143 139 137  K 4.4 3.4* 3.6 3.9 5.0 4.8  CL 111 111 117* 116* 112* 109  CO2 25 23  22 23 24 24   GLUCOSE 95 98 100* 112* 101* 96  BUN 26* 12 9 9 11 15   CREATININE 0.56 0.43* 0.38* 0.36* 0.32* 0.35*  CALCIUM 7.6* 7.0* 6.8* 7.0* 7.3* 7.7*  MG 1.8  --  1.2* 1.7  --  1.9  AST 115* 61* 41 32 40 37  ALT 321* 192* 134* 113* 95* 79*  ALKPHOS 71 52 50 58 53 45  BILITOT 0.8 1.0  0.8 1.0 0.4 0.4   ------------------------------------------------------------------------------------------------------------------ Recent Labs    08/27/19 0624  TRIG 56    No results found for: HGBA1C ------------------------------------------------------------------------------------------------------------------ No results for input(s): TSH, T4TOTAL, T3FREE, THYROIDAB in the last 72 hours.  Invalid input(s): FREET3 ------------------------------------------------------------------------------------------------------------------ No results for input(s): VITAMINB12, FOLATE, FERRITIN, TIBC, IRON, RETICCTPCT in the last 72 hours.  Coagulation profile Recent Labs  Lab 08/22/19 0503  INR 1.0    No results for input(s): DDIMER in the last 72 hours.  Cardiac Enzymes No results for input(s): CKMB, TROPONINI, MYOGLOBIN in the last 168 hours.  Invalid input(s): CK ------------------------------------------------------------------------------------------------------------------ No results found for: BNP   Erick BlinksJehanzeb Azusena Erlandson M.D on 08/27/2019 at 6:50 PM  Go to [email or web address] - for contact info  [facility] - Office  [phone]

## 2019-08-27 NOTE — Care Management Important Message (Signed)
Important Message  Patient Details  Name: Kristine Roberts MRN: [id number] Date of Birth: December 19, 1955   Medicare Important Message Given:  Yes     Tommy Medal 08/27/2019, 11:06 AM

## 2019-08-27 NOTE — Evaluation (Signed)
Physical Therapy Evaluation Patient Details Name: Kristine Roberts MRN: [id number] DOB: 1956-01-30 Today's Date: 08/27/2019   History of Present Illness  Dorathy Stallone  is a 63 y.o. female,  w h/o [name], Hiatal hernia, Esophagitis,  Gastric outlet obstruction, PUD, Copd, apparently c/o back pain, and also nausea, emesis over the past year.  Pt states that has not been able to eat well.  Pt generally weak due to poor po intake.    Clinical Impression  Patient functioning near baseline for functional mobility and gait, demonstrates slightly labored cadence without loss of balance, limited secondary to fatigue and requested to go back to bed after therapy.  Patient will benefit from continued physical therapy in hospital and recommended venue below to increase strength, balance, endurance for safe ADLs and gait.     Follow Up Recommendations Home health PT;Supervision - Intermittent    Equipment Recommendations  None recommended by PT    Recommendations for Other Services       Precautions / Restrictions Precautions Precautions: Fall Restrictions Weight Bearing Restrictions: No      Mobility  Bed Mobility Overal bed mobility: Modified Independent             General bed mobility comments: increased time  Transfers Overall transfer level: Modified independent               General transfer comment: increased time  Ambulation/Gait Ambulation/Gait assistance: Supervision Gait Distance (Feet): 75 Feet Assistive device: None Gait Pattern/deviations: Decreased step length - right;Decreased step length - left;Decreased stride length Gait velocity: decreased   General Gait Details: slightly labored cadence without loss of balance, limited secondary to c/o fatigue  Stairs            Wheelchair Mobility    Modified Rankin (Stroke Patients Only)       Balance Overall balance assessment: Mild deficits observed, not formally tested                                            Pertinent Vitals/Pain Pain Assessment: 0-10 Pain Score: 8  Pain Location: 8-9/10 low back and right hip Pain Descriptors / Indicators: Aching;Sore Pain Intervention(s): Limited activity within patient's tolerance;Monitored during session    Home Living Family/patient expects to be discharged to:: Private residence Living Arrangements: Spouse/significant other Available Help at Discharge: Family Type of Home: House Home Access: Stairs to enter Entrance Stairs-Rails: Right;Left;Can reach both Entrance Stairs-Number of Steps: 3 Home Layout: One level Home Equipment: Environmental consultant - 2 wheels;Cane - single point;Bedside commode      Prior Function Level of Independence: Independent         Comments: household and short distanced community ambulator, does not drive     Hand Dominance   Dominant Hand: Right    Extremity/Trunk Assessment   Upper Extremity Assessment Upper Extremity Assessment: Generalized weakness    Lower Extremity Assessment Lower Extremity Assessment: Generalized weakness    Cervical / Trunk Assessment Cervical / Trunk Assessment: Normal  Communication   Communication: No difficulties  Cognition Arousal/Alertness: Awake/alert Behavior During Therapy: WFL for tasks assessed/performed Overall Cognitive Status: Within Functional Limits for tasks assessed                                        General Comments  Exercises     Assessment/Plan    PT Assessment Patient needs continued PT services  PT Problem List Decreased strength;Decreased activity tolerance;Decreased balance;Decreased mobility       PT Treatment Interventions Balance training;Gait training;Stair training;Functional mobility training;Therapeutic activities;Therapeutic exercise;Patient/family education    PT Goals (Current goals can be found in the Care Plan section)  Acute Rehab PT Goals Patient Stated Goal: return home with  boyfriend to assist PT Goal Formulation: With patient Time For Goal Achievement: 09/03/19 Potential to Achieve Goals: Good    Frequency Min 3X/week   Barriers to discharge        Co-evaluation               AM-PAC PT "6 Clicks" Mobility  Outcome Measure Help needed turning from your back to your side while in a flat bed without using bedrails?: None Help needed moving from lying on your back to sitting on the side of a flat bed without using bedrails?: None Help needed moving to and from a bed to a chair (including a wheelchair)?: None Help needed standing up from a chair using your arms (e.g., wheelchair or bedside chair)?: None Help needed to walk in hospital room?: A Little Help needed climbing 3-5 steps with a railing? : A Little 6 Click Score: 22    End of Session   Activity Tolerance: Patient tolerated treatment well;Patient limited by fatigue Patient left: in bed;with call bell/phone within reach Nurse Communication: Mobility status PT Visit Diagnosis: Unsteadiness on feet (R26.81);Other abnormalities of gait and mobility (R26.89);Muscle weakness (generalized) (M62.81)    Time: 1914-7829 PT Time Calculation (min) (ACUTE ONLY): 24 min   Charges:   PT Evaluation $PT Eval Moderate Complexity: 1 Mod PT Treatments $Therapeutic Activity: 23-37 mins        4:25 PM, 08/27/19 Lonell Grandchild, MPT Physical Therapist with [facility] [phone] office [phone] mobile phone

## 2019-08-27 NOTE — Progress Notes (Signed)
PHARMACY - ADULT TOTAL PARENTERAL NUTRITION CONSULT NOTE   Pharmacy Consult for TPN Indication: NPO=>Malnutrition and Gastric Outlet Obstruction  Patient Measurements: Height: 5' (152.4 cm) Weight: 74 lb 15.3 oz (34 kg) IBW/kg (Calculated) : 45.5 TPN AdjBW (KG): 34 Body mass index is 14.64 kg/m. Usual Weight: 34kg  Assessment: Gastric outlet obstruction secondary to circumferential gastrojejunal anastomotic ulcer with stenosis . Pt with no complaints and and no N/V. No signs of infection. Will initiate lipids along with CHO/Pro slowly d/t concern for refeeding syndrome. Glucose stable, magnesium to be replaced, phosphorus to be replaced.  Repeat EGD 9/18 with dilation of the pylorus and removal of NG tube.  She was started on clear liquids.  Advancing diet per GI. GI plans to continue TPN for 1 month. GI: [facility] Endo: EGD with dilatation 9/18  Insulin requirements in the past 24 hours:  1 unit Lytes:  K 3.6>>3.9>>5 >> 4.8, Phos 1.2>>1.7>> 1.9>>2.6, mag 1.2>> 1.7> 1.9 Renal:stable Hepatobil: T.BILI within nl. LFTS improved ID: no infection  TPN Access:PICC double lumen TPN start date:08/24/2019 Nutritional Goals (per RD recommendation on 9/18): HERD:[id number] (40-45 kcal/kg) Protein: 61-75 grams protein(1.8-2.2g/kg) Fluid:1.4-1.5L  Goal TPN rate is 60 ml/hr   Current Nutrition: clear liquis  Plan:  Increase TPN at 64mL/hr. This TPN provides 69g of protein, 216g of dextrose, and 43g of lipids which provides 1441 kCals per day, meeting ~100% of patient needs Electrolytes in TPN: reduced K, increased Phosphate Add MVI, trace elements to TPN TID SSI and adjust as needed Monitor TPN labs F/U labs,  Kristine Roberts, Kristine Roberts, BCPS Clinical Pharmacist Pager [phone] 08/27/2019,8:35 AM

## 2019-08-28 LAB — COMPREHENSIVE METABOLIC PANEL
ALT: 84 U/L — ABNORMAL HIGH (ref 0–44)
AST: 55 U/L — ABNORMAL HIGH (ref 15–41)
Albumin: 2.8 g/dL — ABNORMAL LOW (ref 3.5–5.0)
Alkaline Phosphatase: 53 U/L (ref 38–126)
Anion gap: 5 (ref 5–15)
BUN: 18 mg/dL (ref 8–23)
CO2: 27 mmol/L (ref 22–32)
Calcium: 8.1 mg/dL — ABNORMAL LOW (ref 8.9–10.3)
Chloride: 104 mmol/L (ref 98–111)
Creatinine, Ser: 0.37 mg/dL — ABNORMAL LOW (ref 0.44–1.00)
GFR calc Af Amer: >60 mL/min (ref >60)
GFR calc non Af Amer: >60 mL/min (ref >60)
Glucose, Bld: 105 mg/dL — ABNORMAL HIGH (ref 70–99)
Potassium: 4.4 mmol/L (ref 3.5–5.1)
Sodium: 136 mmol/L (ref 135–145)
Total Bilirubin: 0.4 mg/dL (ref 0.3–1.2)
Total Protein: 5.5 g/dL — ABNORMAL LOW (ref 6.5–8.1)

## 2019-08-28 LAB — GLUCOSE, CAPILLARY
Glucose-Capillary: 89 mg/dL (ref 70–99)
Glucose-Capillary: 114 mg/dL — ABNORMAL HIGH (ref 70–99)
Glucose-Capillary: 76 mg/dL (ref 70–99)

## 2019-08-28 MED ORDER — TRAVASOL 10 % IV SOLN
INTRAVENOUS | Status: AC
  Administered 2019-08-28: 19:00:00 via INTRAVENOUS
  Filled 2019-08-28: qty 691.2

## 2019-08-28 MED ORDER — HYDROCODONE-ACETAMINOPHEN 5-325 MG PO TABS
2.0000 | ORAL_TABLET | Freq: Once | ORAL | Status: AC
  Administered 2019-08-28: 2 via ORAL
  Filled 2019-08-28: qty 2

## 2019-08-28 MED ORDER — METOCLOPRAMIDE HCL 5 MG/5ML PO SOLN
10.0000 mg | Freq: Three times a day (TID) | ORAL | Status: DC
  Administered 2019-08-28 – 2019-08-30 (×6): 10 mg via ORAL
  Filled 2019-08-28 (×6): qty 10

## 2019-08-28 MED ORDER — PANTOPRAZOLE SODIUM 40 MG PO TBEC
40.0000 mg | DELAYED_RELEASE_TABLET | Freq: Two times a day (BID) | ORAL | Status: DC
  Administered 2019-08-28 – 2019-08-31 (×6): 40 mg via ORAL
  Filled 2019-08-28 (×6): qty 1

## 2019-08-28 MED ORDER — ALBUTEROL SULFATE (2.5 MG/3ML) 0.083% IN NEBU
2.5000 mg | INHALATION_SOLUTION | Freq: Two times a day (BID) | RESPIRATORY_TRACT | Status: DC
  Administered 2019-08-28 – 2019-08-31 (×6): 2.5 mg via RESPIRATORY_TRACT
  Filled 2019-08-28 (×6): qty 3

## 2019-08-28 MED ORDER — CYCLOBENZAPRINE HCL 10 MG PO TABS
10.0000 mg | ORAL_TABLET | Freq: Three times a day (TID) | ORAL | Status: DC | PRN
  Administered 2019-08-29 – 2019-08-31 (×7): 10 mg via ORAL
  Filled 2019-08-28 (×7): qty 1

## 2019-08-28 NOTE — TOC Progression Note (Signed)
Transition of Care [facility]) - Progression Note    Patient Details  Name: Kristine Roberts MRN: [id number] Date of Birth: May 04, 1956  [facility] CM/SW Contact  Mikinzie Maciejewski, Chauncey Reading, RN Phone Number: 08/28/2019, 12:00 PM  Clinical Narrative:   Patient will have a high co-pay for TPN. TPN infusion RN, Pam, will come and talk with patient today.     Expected Discharge Plan: [place] Barriers to Discharge: No Barriers Identified  Expected Discharge Plan and Services Expected Discharge Plan: [place]   Discharge Planning Services: Follow-up appt scheduled, CM Consult Post Acute Care Choice: [place] arrangements for the past 2 months: Single Family Home                           HH Arranged: RN, PT, TPN HH Agency: [facility]) Date HH Agency Contacted: 08/28/19 Time [place]: 1027 Representative spoke with at [place]: [place] ([place]) Interventions    Readmission Risk Interventions No flowsheet data found.

## 2019-08-28 NOTE — Progress Notes (Signed)
PHARMACY - ADULT TOTAL PARENTERAL NUTRITION CONSULT NOTE   Pharmacy Consult for TPN Indication: NPO=>Malnutrition and Gastric Outlet Obstruction  Patient Measurements: Height: 5' (152.4 cm) Weight: 80 lb 4 oz (36.4 kg) IBW/kg (Calculated) : 45.5 TPN AdjBW (KG): 34 Body mass index is 15.67 kg/m. Usual Weight: 34kg  Assessment: Gastric outlet obstruction secondary to circumferential gastrojejunal anastomotic ulcer with stenosis . Pt with no complaints and and no N/V. No signs of infection. Will initiate lipids along with CHO/Pro slowly d/t concern for refeeding syndrome. Glucose stable, magnesium to be replaced, phosphorus to be replaced.  Repeat EGD 9/18 with dilation of the pylorus and removal of NG tube.  She was started on clear liquids.  Advancing diet per GI. GI plans to continue TPN for 1 month. GI: GOO Endo: EGD with dilatation 9/18  Insulin requirements in the past 24 hours:  1 unit Lytes:  K 3.6>>3.9>>5 >> 4.8> 4.4, Phos 1.2>>1.7>> 1.9>>2.6, mag 1.2>> 1.7> 1.9 Renal:stable Hepatobil: T.BILI within nl. LFTS improved ID: no infection  TPN Access:PICC double lumen TPN start date:08/24/2019 Nutritional Goals (per RD recommendation on 9/18): WIOM:[id number] (40-45 kcal/kg) Protein: 61-75 grams protein(1.8-2.2g/kg) Fluid:1.4-1.5L  Goal TPN rate is 60 ml/hr   Current Nutrition: clear liquis  Plan:  Continue TPN at 55mL/hr. This TPN provides 69g of protein, 216g of dextrose, and 43g of lipids which provides 1441 kCals per day, meeting ~100% of patient needs Electrolytes in TPN: same K, same Phosphate Add MVI, Trace elements MWF  TID SSI and adjust as needed Monitor TPN labs F/U labs,  Thomasenia Sales, PharmD, MBA, BCGP Clinical Pharmacist  08/28/2019,8:29 AM

## 2019-08-28 NOTE — Progress Notes (Signed)
Subjective:  Patient states she had a BM during the night and was not black.  She remains with intermittent heartburn.  She is getting relief with Maalox.  She denies nausea.  She remains with epigastric pain.  She says pain is slowly getting better.  She also complains of pain in her back and right hip region.  She states she was able to walk yesterday when she had physical therapy evaluation.   Objective: Blood pressure 103/64, pulse 69, temperature 99.3 F (37.4 C), temperature source Oral, resp. rate 15, height 5' (1.524 m), weight 36.4 kg, SpO2 99 %. Patient is alert and in no acute distress. Abdomen is flat.  Bowel sounds are normal.  She has mild midepigastric tenderness.  No organomegaly or masses. Extremities are very thin with muscle wasting.  No peripheral edema.  Labs/studies Results:   CBC Latest Ref Rng & Units 08/27/2019 08/26/2019 08/25/2019  WBC 4.0 - 10.5 K/uL 5.0 6.0 8.4  Hemoglobin 12.0 - 15.0 g/dL 7.9(L) 7.7(L) 8.2(L)  Hematocrit 36.0 - 46.0 % 25.4(L) 25.1(L) 26.0(L)  Platelets 150 - 400 K/uL 164 154 172    CMP Latest Ref Rng & Units 08/27/2019 08/26/2019 08/25/2019  Glucose 70 - 99 mg/dL 96 101(H) 112(H)  BUN 8 - 23 mg/dL _0 Creatinine 0.44 - 1.00 mg/dL 0.35(L) 0.32(L) 0.36(L)  Sodium 135 - 145 mmol/L 137 139 143  Potassium 3.5 - 5.1 mmol/L 4.8 5.0 3.9  Chloride 98 - 111 mmol/L 109 112(H) 116(H)  CO2 22 - 32 mmol/L _1 Calcium 8.9 - 10.3 mg/dL 7.7(L) 7.3(L) 7.0(L)  Total Protein 6.5 - 8.1 g/dL 4.6(L) 4.5(L) 4.5(L)  Total Bilirubin 0.3 - 1.2 mg/dL 0.4 0.4 1.0  Alkaline Phos 38 - 126 U/L 45 53 58  AST 15 - 41 U/L 37 40 32  ALT 0 - 44 U/L 79(H) 95(H) 113(H)    Hepatic Function Latest Ref Rng & Units 08/27/2019 08/26/2019 08/25/2019  Total Protein 6.5 - 8.1 g/dL 4.6(L) 4.5(L) 4.5(L)  Albumin 3.5 - 5.0 g/dL 2.5(L) 2.5(L) 2.6(L)  AST 15 - 41 U/L 37 40 32  ALT 0 - 44 U/L 79(H) 95(H) 113(H)  Alk Phosphatase 38 - 126 U/L 45 53 58  Total Bilirubin 0.3 - 1.2  mg/dL 0.4 0.4 1.0      Assessment:  #1.  Gastric outlet secondary to high-grade gastrojejunal anastomotic stricture with an ulceration.  EGD last week revealed high-grade stricture with ulceration and moderate amount of food debris.  She had an NG decompression for 2 days followed by therapeutic EGD on 08/24/2019.  She is tolerating liquids.  I do not believe diet should be advanced to heart healthy or low residue diet. She was begun on metoclopramide yesterday as she may also have gastroparesis.  Will transition to oral route later today.  #2.  GERD.  Patient has ulcerative reflux esophagitis primarily secondary to gastric outlet obstruction.  She is now on liquid pantoprazole and using OTC antacids on as needed basis.   #3.  Severe malnutrition.  Parenteral nutrition was begun on 08/24/2019.  She is not having any electrolyte issues.  She appears to be at her goal rate.  I would recommend continuing parenteral nutrition for 4 weeks.  I do not believe that she would do well with oral feeding until the stricture has been further dilated.  Other options would be placement of jejunal feeding tube which would be high risk and I do not believe that nasal jejunal feeding  is practical.  #4.  Anemia appears to be multifactorial.  Anemia should improve with improvement in nutritional status.  #5.  Elevated transaminases on admission felt to be due to ischemic injury.  AST is normal and ALT down to 79 yesterday.  So far no bump in transaminases with parenteral nutrition.   Recommendations  CBC and CMET in a.m. Dietary recommendations for outpatient TPN such as intermittent TPN. Change metoclopramide to oral route today.

## 2019-08-28 NOTE — Progress Notes (Signed)
Patient Demographics:    Kristine Roberts, is a 63 y.o. female, DOB - [birth year], BPZ:[id number]  Admit date - 08/20/2019   Admitting Physician Pearson Grippe, MD  Outpatient Primary MD for the patient is [facility]  LOS - 8   Chief Complaint  Patient presents with   Back Pain        Subjective:    Kristine Roberts she does have intermittent nausea, but no vomiting.  Tolerating liquids.  Assessment  & Plan :    Principal Problem:   Hypotension Active Problems:   COPD (chronic obstructive pulmonary disease) (HCC)   Protein-calorie malnutrition, severe   Hypokalemia   Cachexia (HCC)   ETOH abuse   Nausea   Abnormal liver function   Hyponatremia   Dehydration  Brief Summary 63 y.o. female,  w h/o Kristine Roberts, Hiatal hernia, Esophagitis, prior gastrojejunostomy Gastric outlet obstruction, PUD, Copd admitted on 08/20/2019 with dehydration and hypotension as well as elevated LFTs in the setting of vomiting and poor oral intake   A/p 1) hypotension--a.m. cortisol level is 15.1,  -echo with preserved EF over 50%. -Troponins are flat at 55 and 53 - suspect hypotension is secondary to dehydration and poor oral intake, continue IV fluids continue to encourage oral intake -I also suspect that with her extremely low BMI of 11, it is very likely that she normally runs a low blood pressure in the 90s.  Overall, blood pressures have improved since admission  2) elevated LFTs--- AST is down to 115 from 555, ALT is down to 321 from 737--- -bilirubin is not elevated, -Lipase is 59 -Acetaminophen level was 42.. -Acute viral hepatitis panel negative -Discussed with Dr. Karilyn Cota from GI service -CT abdomen and pelvis with status post cholecystectomy otherwise no acute findings from a hepatobiliary standpoint --Elevation of liver enzymes possibly related to dehydration, volume depletion and ischemic  injury.  Overall labs are improving  3) low back pain--patient with endplate compression fractures at L1 L3 and L4 compatible with osteoporosis--- -back pain appears to be reasonably controlled and she is ambulating with a walker.  4)FTT/severe protein caloric malnutrition ---- BMI of 11, dietitian consult requested give supplements.  Currently on supplemental TPN  5)H/o Etoh Abuse-lorazepam per CIWA protocol, continue multivitamin including thiamine and folic acid.  No signs of withdrawal at this time  6) COPD and tobacco abuse--- no acute flareup at this time continue bronchodilators  7) hypovolemic hyponatremia--- improved with IV hydration  8) intractable emesis--patient with history of PUD, prior esophagitis and gastric outlet obstruction--- patient underwent EGD on 9/16 which noted gastric outlet obstruction secondary to circumferential gastrojejunal anastomotic ulcer with stenosis.  NG tube was placed for decompression.  Repeat EGD 9/18 with dilation of the pylorus and removal of NG tube.  Per GI, she is currently on liquid diet.  PICC line has been placed and she is currently on supplemental TPN.  She will need to continue TPN for the next 4 weeks.  9) hypophosphatemia.  Related to diminished p.o. intake.  Replaced  10) hypokalemia.  Replaced  Magnesium is 1.7  11) Anemia, suspect this is related to chronic disease. downtrending hemoglobin likely related to hemodilution. No signs of bleeding. Continue to follow and transfuse as needed.  Disposition/Need  for in-Hospital Stay-discharge home once arrangements for home TPN are made.  Code Status : Full  Family Communication:   NA (patient is alert, awake and coherent)   Disposition Plan  : Discharge home once home TPN arrangements have been made  Consults  :  Gi  DVT Prophylaxis  :   - Heparin - SCDs   Lab Results  Component Value Date   PLT 164 08/27/2019    Inpatient Medications  Scheduled Meds:  albuterol  2.5 mg  Nebulization BID   Chlorhexidine Gluconate Cloth  6 each Topical Daily   citalopram  20 mg Oral Daily   feeding supplement  1 Container Oral BID BM   feeding supplement (PRO-STAT SUGAR FREE 64)  30 mL Oral BID   folic acid  1 mg Oral Daily   insulin aspart  0-9 Units Subcutaneous Q8H   lidocaine  1 patch Transdermal Q24H   mouth rinse  15 mL Mouth Rinse BID   metoCLOPramide  10 mg Oral TID AC   mometasone-formoterol  1 puff Inhalation BID   nicotine  21 mg Transdermal Daily   pantoprazole sodium  40 mg Per Tube BID   simethicone  160 mg Oral QID   sodium chloride flush  10-40 mL Intracatheter Q12H   thiamine  100 mg Oral Daily   Or   thiamine  100 mg Intravenous Daily   traZODone  50 mg Oral QHS   Continuous Infusions:  TPN ADULT (ION) 60 mL/hr at 08/28/19 1834   PRN Meds:.acetaminophen, albuterol, alum & mag hydroxide-simeth, LORazepam, naphazoline-glycerin, ondansetron (ZOFRAN) IV, phenol, sodium chloride flush    Anti-infectives (From admission, onward)   None        Objective:   Vitals:   08/28/19 0851 08/28/19 0853 08/28/19 1337 08/28/19 1600  BP:   (!) 95/58 99/62  Pulse:   (!) 59 62  Resp:   16 17  Temp:   98.9 F (37.2 C) 98.5 F (36.9 C)  TempSrc:   Oral Oral  SpO2: 99% 100% 93%   Weight:      Height:        Wt Readings from Last 3 Encounters:  08/28/19 36.4 kg  09/18/18 31.8 kg  08/20/18 31.8 kg     Intake/Output Summary (Last 24 hours) at 08/28/2019 1918 Last data filed at 08/28/2019 1834 Gross per 24 hour  Intake 2173.67 ml  Output --  Net 2173.67 ml     Physical Exam  General exam: Alert, awake, oriented x 3, cachectic Respiratory system: Clear to auscultation. Respiratory effort normal. Cardiovascular system:RRR. No murmurs, rubs, gallops. Gastrointestinal system: Abdomen is nondistended, soft and nontender. No organomegaly or masses felt. Normal bowel sounds heard. Central nervous system: Alert and oriented. No focal  neurological deficits. Extremities: No C/C/E, +pedal pulses Skin: No rashes, lesions or ulcers Psychiatry: Judgement and insight appear normal. Mood & affect appropriate.      Data Review:   Micro Results Recent Results (from the past 240 hour(s))  SARS Coronavirus 2 [facility] order, Performed in [facility] hospital lab) Nasopharyngeal Nasopharyngeal Swab     Status: None   Collection Time: 08/20/19  3:25 PM   Specimen: Nasopharyngeal Swab  Result Value Ref Range Status   SARS Coronavirus 2 NEGATIVE NEGATIVE Final    Comment: (NOTE) If result is NEGATIVE SARS-CoV-2 target nucleic acids are NOT DETECTED. The SARS-CoV-2 RNA is generally detectable in upper and lower  respiratory specimens during the acute phase of infection. The lowest  concentration of SARS-CoV-2 viral copies this assay can detect is 250  copies / mL. A negative result does not preclude SARS-CoV-2 infection  and should not be used as the sole basis for treatment or other  patient management decisions.  A negative result may occur with  improper specimen collection / handling, submission of specimen other  than nasopharyngeal swab, presence of viral mutation(s) within the  areas targeted by this assay, and inadequate number of viral copies  (<250 copies / mL). A negative result must be combined with clinical  observations, patient history, and epidemiological information. If result is POSITIVE SARS-CoV-2 target nucleic acids are DETECTED. The SARS-CoV-2 RNA is generally detectable in upper and lower  respiratory specimens dur ing the acute phase of infection.  Positive  results are indicative of active infection with SARS-CoV-2.  Clinical  correlation with patient history and other diagnostic information is  necessary to determine patient infection status.  Positive results do  not rule out bacterial infection or co-infection with other viruses. If result is PRESUMPTIVE POSTIVE SARS-CoV-2 nucleic acids MAY BE  PRESENT.   A presumptive positive result was obtained on the submitted specimen  and confirmed on repeat testing.  While 2019 novel coronavirus  (SARS-CoV-2) nucleic acids may be present in the submitted sample  additional confirmatory testing may be necessary for epidemiological  and / or clinical management purposes  to differentiate between  SARS-CoV-2 and other Sarbecovirus currently known to infect humans.  If clinically indicated additional testing with an alternate test  methodology [id number]) is advised. The SARS-CoV-2 RNA is generally  detectable in upper and lower respiratory sp ecimens during the acute  phase of infection. The expected result is Negative. Fact Sheet for Patients:  BoilerBrush.com.cyhttps://www.fda.gov/media/136312/download Fact Sheet for Healthcare Providers: https://pope.com/https://www.fda.gov/media/136313/download This test is not yet approved or cleared by the [place] FDA and has been authorized for detection and/or diagnosis of SARS-CoV-2 by FDA under an Emergency Use Authorization (EUA).  This EUA will remain in effect (meaning this test can be used) for the duration of the COVID-19 declaration under Section 564(b)(1) of the Act, 21 U.S.C. section 360bbb-3(b)(1), unless the authorization is terminated or revoked sooner. Performed at [facility], [address] [place], [place] [address]   MRSA PCR Screening     Status: None   Collection Time: 08/20/19  6:27 PM   Specimen: Nasal Mucosa; Nasopharyngeal  Result Value Ref Range Status   MRSA by PCR NEGATIVE NEGATIVE Final    Comment:        The GeneXpert MRSA Assay (FDA approved for NASAL specimens only), is one component of a comprehensive MRSA colonization surveillance program. It is not intended to diagnose MRSA infection nor to guide or monitor treatment for MRSA infections. Performed at [facility], [address] [place], [place] [address]     Radiology Reports Ct Abdomen Pelvis Wo Contrast  Result Date:  08/20/2019 CLINICAL DATA:  Back pain. Nausea and vomiting. Weight loss. Abnormal liver function. EXAM: CT ABDOMEN AND PELVIS WITHOUT CONTRAST TECHNIQUE: Multidetector CT imaging of the abdomen and pelvis was performed following the standard protocol without IV contrast. COMPARISON:  Multiple exams, including 07/14/2018 and 10/25/2014 FINDINGS: Lower chest: 5 by 4 mm right lower lobe pulmonary nodule along the diaphragm, image 15/4, no change from 10/25/2014, considered benign. Calcified granuloma in the left lower lobe on image 7/4, benign. Hepatobiliary: Cholecystectomy.  Otherwise unremarkable. Pancreas: Considerable atrophy. Spleen: Unremarkable Adrenals/Urinary Tract: Hypodense 1.8 cm left kidney upper pole lesion, likely a  cyst, previously 1.6 cm. Adrenal glands unremarkable. No urinary tract calculi are identified. Stomach/Bowel: Postoperative findings including gastrojejunostomy at not stenotic staple lines in upper pelvic loops of bowel Vascular/Lymphatic: Aortoiliac atherosclerotic vascular disease. No well-defined adenopathy. Reproductive: Uterus is thought to be absent.  Adnexa unremarkable. Other: Severe paucity of adipose tissue favoring cachexia. This makes separation of adjacent abdominal structures such as bowel loops problematic. Musculoskeletal: Right hip screw. Bony demineralization. Interval mild inferior endplate compression fractures at L3 and L4. Interval mild superior endplate compression fracture at L1. Old superior endplate compression fracture L5. IMPRESSION: 1. Interval endplate compression fractures at L1, L3, and L4, compatible with osteoporosis. 2. Prior gastrojejunostomy, along with anastomotic staple line along what is likely a small bowel loop in the pelvis. The bowel loops are difficult to separate due to the patient's striking paucity of bodily adipose tissue including intra-abdominal adipose tissue. There is formed stool in the colon and I do not see definite dilated bowel. 3.  Left kidney upper pole cyst.  No definite urinary tract calculi. 4.  Aortic Atherosclerosis (ICD10-I70.0). Electronically Signed   By: Gaylyn Rong M.D.   On: 08/20/2019 17:18   Dg Abdomen Acute W/chest  Result Date: 08/20/2019 CLINICAL DATA:  Abdominal pain EXAM: DG ABDOMEN ACUTE W/ 1V CHEST COMPARISON:  Abdomen series December 26, 2017 FINDINGS: PA chest: Lungs are hyperexpanded. There are several scattered small calcified granulomas. There is no edema or consolidation. Heart size and pulmonary vascularity are normal. No adenopathy. There old healed rib fractures bilaterally. There is midthoracic dextroscoliosis. Supine and upright abdomen: There are multiple surgical clips in the abdomen and pelvis. There is moderate stool in the colon. There is no bowel dilatation or air-fluid level to suggest bowel obstruction. No free air. Postoperative changes noted in the proximal right femur. Bones are osteoporotic. IMPRESSION: No bowel obstruction or free air. Moderate stool in colon. Extensive postoperative change in the abdomen and pelvis. Lungs mildly hyperexpanded without edema or consolidation. Scattered calcified granulomas noted. Electronically Signed   By: Bretta Bang III M.D.   On: 08/20/2019 13:23   [place] Ekg Site Rite  Result Date: 08/22/2019 If Site Rite image not attached, placement could not be confirmed due to current cardiac rhythm.    CBC Recent Labs  Lab 08/23/19 0423 08/24/19 0410 08/25/19 0441 08/26/19 0705 08/27/19 0624  WBC 6.0 8.0 8.4 6.0 5.0  HGB 8.5* 7.9* 8.2* 7.7* 7.9*  HCT 27.0* 25.2* 26.0* 25.1* 25.4*  PLT 175 164 172 154 164  MCV 93.4 94.7 94.2 97.7 97.3  MCH 29.4 29.7 29.7 30.0 30.3  MCHC 31.5 31.3 31.5 30.7 31.1  RDW 21.1* 21.1* 21.0* 20.7* 20.7*  LYMPHSABS  --  1.8 1.3  --  1.5  MONOABS  --  0.4 0.3  --  0.6  EOSABS  --  0.1 0.1  --  0.1  BASOSABS  --  0.0 0.0  --  0.0    Chemistries  Recent Labs  Lab 08/22/19 0503  08/24/19 0410 08/25/19 0441  08/26/19 0705 08/27/19 0624 08/28/19 0900  NA 139   < > 144 143 139 137 136  K 4.4   < > 3.6 3.9 5.0 4.8 4.4  CL 111   < > 117* 116* 112* 109 104  CO2 25   < > 22 23 24 24 27   GLUCOSE 95   < > 100* 112* 101* 96 105*  BUN 26*   < > 9 9 11 15 18   CREATININE 0.56   < >  0.38* 0.36* 0.32* 0.35* 0.37*  CALCIUM 7.6*   < > 6.8* 7.0* 7.3* 7.7* 8.1*  MG 1.8  --  1.2* 1.7  --  1.9  --   AST 115*   < > 41 32 40 37 55*  ALT 321*   < > 134* 113* 95* 79* 84*  ALKPHOS 71   < > 50 58 53 45 53  BILITOT 0.8   < > 0.8 1.0 0.4 0.4 0.4   < > = values in this interval not displayed.   ------------------------------------------------------------------------------------------------------------------ Recent Labs    08/27/19 0624  TRIG 56    No results found for: HGBA1C ------------------------------------------------------------------------------------------------------------------ No results for input(s): TSH, T4TOTAL, T3FREE, THYROIDAB in the last 72 hours.  Invalid input(s): FREET3 ------------------------------------------------------------------------------------------------------------------ No results for input(s): VITAMINB12, FOLATE, FERRITIN, TIBC, IRON, RETICCTPCT in the last 72 hours.  Coagulation profile Recent Labs  Lab 08/22/19 0503  INR 1.0    No results for input(s): DDIMER in the last 72 hours.  Cardiac Enzymes No results for input(s): CKMB, TROPONINI, MYOGLOBIN in the last 168 hours.  Invalid input(s): CK ------------------------------------------------------------------------------------------------------------------ No results found for: BNP   Erick BlinksJehanzeb Rogue Rafalski M.D on 08/28/2019 at 7:18 PM  Go to [email or web address] - for contact info  [facility] - Office  [phone]

## 2019-08-28 NOTE — TOC Initial Note (Addendum)
Transition of Care [facility]) - Initial/Assessment Note    Patient Details  Name: Kristine Roberts MRN: [id number] Date of Birth: 1956-10-24  Transition of Care [facility]) CM/SW Contact:    Destiney Sanabia, Chrystine Oiler, RN Phone Number: 08/28/2019, 10:32 AM  Clinical Narrative:   63 y.o.female,w h/o Gerd, Hiatal hernia, Esophagitis, prior gastrojejunostomyGastric outlet obstruction, PUD, Copd admitted on 08/20/2019 with dehydration and hypotension as well as elevated LFTs in the setting of vomiting and poor oral intake.  Independent, from home with significant other.  Patient will go home with TPN.  She will need [facility] RN and PT. Patient agreeable. Referred to [facility] for home health and Jeri Modena with [facility] for TPN infusion.   Patient does not have a primary care. Appointment made with Dr. Kirstie Peri of [facility] for Monday September 28th.  Dr. Karilyn Cota agrees to follow patient until primary care established.   Expected Discharge Plan: Home w Home Health Services Barriers to Discharge: No Barriers Identified   Patient Goals and CMS Choice Patient states their goals for this hospitalization and ongoing recovery are:: return home CMS Medicare.gov Compare Post Acute Care list provided to:: Patient Choice offered to / list presented to : Patient  Expected Discharge Plan and Services Expected Discharge Plan: Home w Home Health Services   Discharge Planning Services: Follow-up appt scheduled, CM Consult Post Acute Care Choice: Home Health Living arrangements for the past 2 months: Single Family Home                           HH Arranged: RN, PT, TPN HH Agency: [facility] Date HH Agency Contacted: 08/28/19 Time HH Agency Contacted: 1027 Representative spoke with at [facility] Agency: Linda/Pam  Prior Living Arrangements/Services Living arrangements for the past 2 months: Single Family Home Lives with:: Other (Comment)(boyfriend) Patient language  and need for interpreter reviewed:: Yes Do you feel safe going back to the place where you live?: Yes      Need for Family Participation in Patient Care: Yes (Comment) Care giver support system in place?: Yes (comment) Current home services: Home RN, Home PT Criminal Activity/Legal Involvement Pertinent to Current Situation/Hospitalization: No - Comment as needed  Activities of Daily Living Home Assistive Devices/Equipment: Cane (specify quad or straight), Eyeglasses, Dentures (specify type), Walker (specify type) ADL Screening (condition at time of admission) Patient's cognitive ability adequate to safely complete daily activities?: Yes Is the patient deaf or have difficulty hearing?: No Does the patient have difficulty seeing, even when wearing glasses/contacts?: No Does the patient have difficulty concentrating, remembering, or making decisions?: No Patient able to express need for assistance with ADLs?: Yes Does the patient have difficulty dressing or bathing?: Yes Independently performs ADLs?: Yes (appropriate for developmental age) Does the patient have difficulty walking or climbing stairs?: No Weakness of Legs: None Weakness of Arms/Hands: None  Permission Sought/Granted Permission sought to share information with : Case Manager Permission granted to share information with : Yes, Verbal Permission Granted              Emotional Assessment   Attitude/Demeanor/Rapport: Engaged Affect (typically observed): Accepting Orientation: : Oriented to Self, Oriented to Place, Oriented to  Time Alcohol / Substance Use: Not Applicable Psych Involvement: No (comment)  Admission diagnosis:  Hypokalemia [E87.6] Bradycardia [R00.1] Hyponatremia [E87.1] Elevated BUN [R79.9] Elevated troponin [R79.89] Elevated LFTs [R94.5] AKI (acute kidney injury) (HCC) [N17.9] Hypotension due to hypovolemia [I95.89, E86.1] Hypotension [I95.9] Patient Active  Problem List   Diagnosis Date Noted  .  Dehydration 08/22/2019  . Nausea 08/20/2019  . Abnormal liver function 08/20/2019  . Hyponatremia 08/20/2019  . Closed right hip fracture ([place]) 07/08/2018  . Cellulitis of both lower extremities 07/08/2018  . Gastric ulcer 01/11/2018  . Hypokalemia 12/26/2017  . Cachexia ([place]) 12/26/2017  . Hypotension 12/26/2017  . Tobacco abuse 12/26/2017  . ETOH abuse 12/26/2017  . Anemia 12/26/2017  . Pressure injury of skin 12/26/2017  . Protein-calorie malnutrition, severe 03/05/2016  . Gastric outlet obstruction 03/03/2016  . COPD (chronic obstructive pulmonary disease) ([place]) 03/03/2016  . Dysphagia 02/03/2016   PCP:  Sandria Manly Benedict:   [facility] [address], [place] [address] Phone: [phone] Fax: [phone]     Social Determinants of Health (SDOH) Interventions    Readmission Risk Interventions No flowsheet data found.

## 2019-08-29 LAB — CBC
HCT: 22.2 % — ABNORMAL LOW (ref 36.0–46.0)
Hemoglobin: 6.9 g/dL — CL (ref 12.0–15.0)
MCH: 30.5 pg (ref 26.0–34.0)
MCHC: 31.1 g/dL (ref 30.0–36.0)
MCV: 98.2 fL (ref 80.0–100.0)
Platelets: 164 10*3/uL (ref 150–400)
RBC: 2.26 MIL/uL — ABNORMAL LOW (ref 3.87–5.11)
RDW: 19.9 % — ABNORMAL HIGH (ref 11.5–15.5)
WBC: 4.3 10*3/uL (ref 4.0–10.5)
nRBC: 0 % (ref 0.0–0.2)

## 2019-08-29 LAB — GLUCOSE, CAPILLARY
Glucose-Capillary: 101 mg/dL — ABNORMAL HIGH (ref 70–99)
Glucose-Capillary: 75 mg/dL (ref 70–99)
Glucose-Capillary: 93 mg/dL (ref 70–99)
Glucose-Capillary: 96 mg/dL (ref 70–99)

## 2019-08-29 LAB — PREPARE RBC (CROSSMATCH)

## 2019-08-29 LAB — ABO/RH: ABO/RH(D): A NEG

## 2019-08-29 MED ORDER — TRAVASOL 10 % IV SOLN
INTRAVENOUS | Status: AC
  Administered 2019-08-29: 18:00:00 via INTRAVENOUS
  Filled 2019-08-29: qty 691.2

## 2019-08-29 MED ORDER — CHLORHEXIDINE GLUCONATE CLOTH 2 % EX PADS
6.0000 | MEDICATED_PAD | Freq: Every day | CUTANEOUS | Status: DC
  Administered 2019-08-29 – 2019-08-31 (×3): 6 via TOPICAL

## 2019-08-29 MED ORDER — SODIUM CHLORIDE 0.9% IV SOLUTION
Freq: Once | INTRAVENOUS | Status: AC
  Administered 2019-08-29: 14:00:00 via INTRAVENOUS

## 2019-08-29 NOTE — Progress Notes (Signed)
PHARMACY - ADULT TOTAL PARENTERAL NUTRITION CONSULT NOTE   Pharmacy Consult for TPN Indication: NPO=>Malnutrition and Gastric Outlet Obstruction  Patient Measurements: Height: 5' (152.4 cm) Weight: 80 lb 14.4 oz (36.7 kg) IBW/kg (Calculated) : 45.5 TPN AdjBW (KG): 34 Body mass index is 15.8 kg/m. Usual Weight: 34kg  Assessment: Gastric outlet obstruction secondary to circumferential gastrojejunal anastomotic ulcer with stenosis . Pt with no complaints and and no N/V. No signs of infection. Will initiate lipids along with CHO/Pro slowly d/t concern for refeeding syndrome. Glucose stable, magnesium to be replaced, phosphorus to be replaced.  Repeat EGD 9/18 with dilation of the pylorus and removal of NG tube.  She was started on clear liquids.  Advancing diet per GI. GI plans to continue TPN for 1 month. GI: GOO Endo: EGD with dilatation 9/18  Insulin requirements in the past 24 hours:  0 unit Lytes:  K 3.6>>3.9>>5 >> 4.8> 4.4, Phos 1.2>>1.7>> 1.9>>2.6, mag 1.2>> 1.7> 1.9 Renal:stable Hepatobil: T.BILI within nl. LFTS improved ID: no infection  TPN Access:PICC double lumen TPN start date:08/24/2019 Nutritional Goals (per RD recommendation on 9/18): YFVC:9449-6759 (40-45 kcal/kg) Protein: 61-75 grams protein(1.8-2.2g/kg) Fluid:1.4-1.5L  Goal TPN rate is 60 ml/hr   Current Nutrition: clear liquis  Plan:  Continue TPN at 74mL/hr. This TPN provides 69g of protein, 216g of dextrose, and 43g of lipids which provides 1441 kCals per day, meeting ~100% of patient needs Electrolytes in TPN: same K, same Phosphate Add MVI, Trace elements MWF  TID SSI and adjust as needed Monitor TPN labs F/U labs,  Kristine Roberts, BS Vena Austria, BCPS Clinical Pharmacist Pager [phone] 08/29/2019,10:44 AM

## 2019-08-29 NOTE — Progress Notes (Signed)
CC: Gastric outlet with gastrojejunal anastomotic stricture, anemia, elevated LFTs  Subjective: She complains of having lower back pain. Some nausea. No abdominal pain.  He passed a small amount of darker brown stool, no frank melena or bright red rectal bleeding.  She is tolerating a clear liquid diet. TPN infusing at 60cc/hr.   Objective:  Vital signs in last 24 hours: Temp:  [98.5 F (36.9 C)-99.3 F (37.4 C)] 98.6 F (37 C) (09/23 0517) Pulse Rate:  [59-73] 69 (09/23 0517) Resp:  [16-17] 16 (09/23 0517) BP: (95-101)/(50-62) 101/56 (09/23 0517) SpO2:  [93 %-99 %] 96 % (09/23 0742) Weight:  [36.7 kg] 36.7 kg (09/23 0500) Last BM Date: 08/28/19 General: Thin  cachectic appearing 63 year old female in no acute distress Eyes: Sclera nonicteric, conjunctiva pink Heart: Irregular rhythm, no murmurs Pulm: Sounds clear, slightly diminished to the left lower base Abdomen: Flat, nontender, positive bowel sounds to all 4 quadrants, right upper quadrant scar intact Extremities:  Without edema. Neurologic:  Alert and  oriented x4;  grossly normal neurologically. Psych:  Alert and cooperative. Normal mood and affect.  Intake/Output from previous day: 09/22 0701 - 09/23 0700 In: 2576 [P.O.:840; I.V.:1736] Out: 500 [Urine:500] Intake/Output this shift: Total I/O In: 240 [P.O.:240] Out: -   Lab Results: Recent Labs    08/27/19 0624 08/29/19 0434  WBC 5.0 4.3  HGB 7.9* 6.9*  HCT 25.4* 22.2*  PLT 164 164   BMET Recent Labs    08/27/19 0624 08/28/19 0900  NA 137 136  K 4.8 4.4  CL 109 104  CO2 24 27  GLUCOSE 96 105*  BUN 15 18  CREATININE 0.35* 0.37*  CALCIUM 7.7* 8.1*   LFT Recent Labs    08/28/19 0900  PROT 5.5*  ALBUMIN 2.8*  AST 55*  ALT 84*  ALKPHOS 53  BILITOT 0.4   PT/INR No results for input(s): LABPROT, INR in the last 72 hours. Hepatitis Panel No results for input(s): HEPBSAG, HCVAB, HEPAIGM, HEPBIGM in the last 72 hours.  No results  found.  Assessment / Plan: 1.  Gastric outlet secondary to high-grade gastrojejunal anastomotic stricture with an ulceration.  EGD 08/24/2019 identified a high-grade stricture with ulceration and moderate amount of food debris.  NG tube decompression for 2 days followed her EGD.  -Continue clear liquid diet for now, patient would like to advance to a soft diet -Continue pantoprazole 40 mg p.o. twice daily -Continue Reglan 3 times daily and Zofran as needed  2.  GERD: Ulcerative reflux esophagitis secondary to gastric outlet obstruction.  -Continue pantoprazole 40 mg p.o. twice daily  3.  Severe malnutrition.  TPN started 08/24/2019.  Albumin 2.8. WT 80.53 lbs. -TPN to be continued for the next 4 weeks.  4.  Acute on chronic anemia.  Hemoglobin today down to 6.9 from 7.9.  Hematocrit 22.2 down from 25.4. Patient passed a dark brown stool this morning. She is hemodynamically stable - transfusion 1 unit PRBCs ordered  5.  Elevated left teas on admission assessed to be most likely due to ischemic injury.  Cute hepatitis panel was negative.  Labs 08/28/2019: AST 55.  ALT 84. -repeat hepatic panel in am  The recommendations from Dr. Dorien Chihuahua later today    LOS: 9 days   Noralyn Pick  08/29/2019, 9:10 AM

## 2019-08-29 NOTE — TOC Progression Note (Signed)
Transition of Care [facility]) - Progression Note    Patient Details  Name: Kristine Roberts MRN: [id number] Date of Birth: 1956-02-16  Transition of Care [facility]) CM/SW Contact  Rollo Farquhar, Chauncey Reading, RN Phone Number: 08/29/2019, 12:40 PM  Clinical Narrative:  Process of getting TPN approval still in process.     Expected Discharge Plan: [place] Barriers to Discharge: No Barriers Identified  Expected Discharge Plan and Services Expected Discharge Plan: [place]   Discharge Planning Services: Follow-up appt scheduled, CM Consult Post Acute Care Choice: [place] arrangements for the past 2 months: Single Family Home                           HH Arranged: RN, PT, TPN HH Agency: [facility]) Date HH Agency Contacted: 08/28/19 Time [place]: 1027 Representative spoke with at [place]: [place] ([place]) Interventions    Readmission Risk Interventions No flowsheet data found.

## 2019-08-29 NOTE — Care Management Important Message (Signed)
Important Message  Patient Details  Name: Kristine Roberts MRN: [id number] Date of Birth: 09/16/1956   Medicare Important Message Given:  Yes     Tommy Medal 08/29/2019, 12:14 PM

## 2019-08-29 NOTE — Progress Notes (Addendum)
Physical Therapy Treatment Patient Details Name: Kristine Roberts MRN: [id number] DOB: [birth year] Today's Date: 08/29/2019    History of Present Illness Kristine Roberts  is a 63 y.o. female,  w h/o [name], Hiatal hernia, Esophagitis,  Gastric outlet obstruction, PUD, Copd, apparently c/o back pain, and also nausea, emesis over the past year.  Pt states that has not been able to eat well.  Pt generally weak due to poor po intake.    PT Comments    Patient presents supine and agreeable to therapy. Pt continues to perform bed mobility and transfers without AD at modified independent. Pt able to perform marching and sidestepping R/L without loss of balance, demonstrating decreased cadence and limited by fatigue. Pt progressing with gait and balance this session, reports has assistance at home. Pt returned to bed at [facility] for breakfast despite being encouraged to sit up in bedside chair; call bell in lap and all needs in reach. Due to progress, pt frequency decreased with therapy in acute care, recommending HHPT vs. OPPT to continue increasing strength, balance, endurance for safe ADLs and gait.    Follow Up Recommendations  Home health PT;Outpatient PT     Equipment Recommendations  None recommended by PT    Recommendations for Other Services       Precautions / Restrictions Restrictions Weight Bearing Restrictions: No    Mobility  Bed Mobility Overal bed mobility: Modified Independent             General bed mobility comments: increased time supine<>sit EOB  Transfers Overall transfer level: Modified independent Equipment used: None             General transfer comment: increased time  Ambulation/Gait Ambulation/Gait assistance: Supervision Gait Distance (Feet): 80 Feet Assistive device: None Gait Pattern/deviations: Decreased step length - right;Decreased step length - left;Decreased stride length Gait velocity: decreased   General Gait Details: slightly labored cadence,  sidestepping and marching without loss of balance   Stairs             Wheelchair Mobility    Modified Rankin (Stroke Patients Only)       Balance Overall balance assessment: Mild deficits observed, not formally tested                                          Cognition Arousal/Alertness: Awake/alert Behavior During Therapy: WFL for tasks assessed/performed Overall Cognitive Status: Within Functional Limits for tasks assessed                                        Exercises      General Comments        Pertinent Vitals/Pain Pain Assessment: 0-10 Pain Score: 8  Pain Location: low back and R hip Pain Descriptors / Indicators: Aching Pain Intervention(s): Limited activity within patient's tolerance;Monitored during session;Repositioned    Home Living                      Prior Function            PT Goals (current goals can now be found in the care plan section) Acute Rehab PT Goals Patient Stated Goal: return home with boyfriend to assist PT Goal Formulation: With patient Time For Goal Achievement: 09/03/19 Potential to Achieve Goals: Good Progress  towards PT goals: Progressing toward goals    Frequency    Min 1X/week      PT Plan Current plan remains appropriate    Co-evaluation              AM-PAC PT "6 Clicks" Mobility   Outcome Measure  Help needed turning from your back to your side while in a flat bed without using bedrails?: None Help needed moving from lying on your back to sitting on the side of a flat bed without using bedrails?: None Help needed moving to and from a bed to a chair (including a wheelchair)?: None Help needed standing up from a chair using your arms (e.g., wheelchair or bedside chair)?: None Help needed to walk in hospital room?: None Help needed climbing 3-5 steps with a railing? : A Little 6 Click Score: 23    End of Session   Activity Tolerance: Patient  tolerated treatment well;Patient limited by pain Patient left: in bed;with call bell/phone within reach Nurse Communication: Mobility status PT Visit Diagnosis: Unsteadiness on feet (R26.81);Other abnormalities of gait and mobility (R26.89);Muscle weakness (generalized) (M62.81)     Time: 0867-6195 PT Time Calculation (min) (ACUTE ONLY): 10 min  Charges:  $Gait Training: 8-22 mins                     Tori Tavius Turgeon PT, DPT 08/29/19, 8:17 AM [phone]

## 2019-08-29 NOTE — Progress Notes (Signed)
Nutrition Follow-up  DOCUMENTATION CODES:   Underweight, Severe malnutrition in context of chronic illness  INTERVENTION:  -TPN per pharmacy: 60 ml/hr providing 1441 kcal and 69 grams protein daily  -Ensure Enlive po BID, each supplement provides 350 kcal and 20 grams of protein  -Diet advancement per GI recommendations  NUTRITION DIAGNOSIS:   Inadequate oral intake related to altered GI function as evidenced by NPO status(TPN).  Progressing - TPN at goal; diet advanced to full liquid  GOAL:   Patient will meet greater than or equal to 90% of their needs  Met with TPN  MONITOR:   Weight trends, Labs, I & O's, Supplement acceptance  REASON FOR ASSESSMENT:   Consult New TPN/TNA  ASSESSMENT:  63 year old female with medical history significant for GERD, hiatal hernia, esophagitis, gastric outlet obstruction, gastric stenosis, peptic ulcer disease,  COPD, history of gastrojejunostomy, history of nausea and emesis over the past year  9/16: PICC 9/18: EGD - identified high-grade stricture with ulceration; NGT decompression x 2 days 9/18: TPN initiated at 30 ml/hr  Patient tolerating clear liquid diet; per chart review pt requested soft diet. Full liquid diet advanced today, will continue to monitor for po intake and diet tolerance.   GI recommendation for continuation of TPN for the next 4 weeks; pending insurance approval.  Current wt 36.7 kg (80.7 lb)  Admit wt 34 kg (74.8 lb)  I/Os: +13960 ml since admit     +2076 ml x 24 hr  Medications reviewed and include: folic acid, SSI, reglan, protonix, thiamine  Labs: 9/21 Mg/Phos/K - WNL Ca corrects for low albumin 9.1 Hgb 6.9 (L)  CBGS 75-101  Diet Order:   Diet Order            Diet full liquid Room service appropriate? Yes; Fluid consistency: Thin  Diet effective now              EDUCATION NEEDS:   No education needs have been identified at this time  Skin:  Skin Assessment: Reviewed RN Assessment  Last  BM:  9/22  Height:   Ht Readings from Last 1 Encounters:  08/24/19 5' (1.524 m)    Weight:   Wt Readings from Last 1 Encounters:  08/29/19 36.7 kg    Ideal Body Weight:  54.5 kg  BMI:  Body mass index is 15.8 kg/m.  Estimated Nutritional Needs:   Kcal:  1027-2536 (40-45 kcal/kg)  Protein:  61-75 (1.8-2.2g/kg)  Fluid:  1.4-1.5L   Lajuan Lines, RD, LDN Office [phone] After Hours/Weekend Pager: [phone]

## 2019-08-29 NOTE — Progress Notes (Addendum)
CRITICAL VALUE ALERT  Critical Value:  Hemoglobin 6.9  Date & Time Notied:  08/29/19 0641  Provider Notified: Dr. Darrick Meigs  Orders Received/Actions taken: One unit PRBC

## 2019-08-29 NOTE — Progress Notes (Signed)
Patient Demographics:    Kristine Roberts, is a 63 y.o. female, DOB - 04-18-1956, WJX:[id number]  Admit date - 08/20/2019   Admitting Physician Pearson GrippeJames Kim, MD  Outpatient [facility] the [facility]  LOS - 9   Chief Complaint  Patient presents with   Back Pain        Subjective:    Kristine Roberts she does have intermittent nausea, but no vomiting.  Tolerating liquids. -Tolerating TPN IV infusion well  Assessment  & Plan :    Principal Problem:   Hypotension Active Problems:   COPD (chronic obstructive pulmonary disease) (HCC)   Protein-calorie malnutrition, severe   Hypokalemia   Cachexia (HCC)   ETOH abuse   Nausea   Abnormal liver function   Hyponatremia   Dehydration  Brief Summary 63 y.o. female,  w h/o Genella RifeGerd, Hiatal hernia, Esophagitis, prior gastrojejunostomy Gastric outlet obstruction, PUD, Copd admitted on 08/20/2019 with dehydration and hypotension as well as elevated LFTs in the setting of vomiting and poor oral intake   A/p 1) hypotension--a.m. cortisol level is 15.1,  -echo with preserved EF over 50%. -Troponins are flat at 55 and 53 - suspect hypotension is secondary to dehydration and poor oral intake, continue IV fluids continue to encourage oral intake -   2) elevated LFTs---  -bilirubin is not elevated,  -Lipase is 59 -Acetaminophen level was 42.. -Acute viral hepatitis panel negative -Discussed with Dr. Karilyn Cotaehman from GI service -CT abdomen and pelvis with status post cholecystectomy otherwise no acute findings from a hepatobiliary standpoint --Elevation of liver enzymes possibly related to dehydration, volume depletion and ischemic injury.  Overall labs are improving  3) low back pain--patient with endplate compression fractures at L1 L3 and L4 compatible with osteoporosis--- -back pain appears to be reasonably controlled and she is  ambulating with a walker.  4)FTT/severe protein caloric malnutrition ---- BMI of 11, dietitian consult requested give supplements.  Currently on supplemental TPN  5)H/o Etoh Abuse-lorazepam per CIWA protocol, continue multivitamin including thiamine and folic acid.  No signs of withdrawal at this time  6) COPD and tobacco abuse--- no acute flareup at this time continue bronchodilators  7)Hypovolemic Hyponatremia--- resolved with IV hydration  8)Intractable Emesis--patient with history of PUD, prior esophagitis and gastric outlet obstruction--- patient underwent EGD on 9/16 which noted gastric outlet obstruction secondary to circumferential gastrojejunal anastomotic ulcer with stenosis.  NG tube was placed for decompression.  Repeat EGD 9/18 with dilation of the pylorus and removal of NG tube.  Per GI, she is currently on liquid diet.  PICC line has been placed and she is currently on supplemental TPN.  She will need to continue TPN for the next 4 weeks.  9)hypophosphatemia/hypokalemia/hypomagnesemia-- suspect due to refeeding syndrome.  Replace and recheck  10) symptomatic acute on chronic anemia, suspect this is related to chronic disease. downtrending hemoglobin likely related to hemodilution. No signs of bleeding.  -Patient with dizziness and some dyspnea and exertional fatigue -Hemoglobin is 6.9, transfuse 1 unit of packed cells   Disposition/Need for in-Hospital Stay-discharge home once arrangements for home TPN are made.  Code Status : Full  Family Communication:   NA (patient is alert, awake and coherent)   Disposition Plan  :  Discharge home once home TPN arrangements have been made  Consults  :  Gi  DVT Prophylaxis  :   - Heparin - SCDs   Lab Results  Component Value Date   PLT 164 08/29/2019    Inpatient Medications  Scheduled Meds:  albuterol  2.5 mg Nebulization BID   Chlorhexidine Gluconate Cloth  6 each Topical Daily   citalopram  20 mg Oral Daily   feeding  supplement  1 Container Oral BID BM   feeding supplement (PRO-STAT SUGAR FREE 64)  30 mL Oral BID   folic acid  1 mg Oral Daily   insulin aspart  0-9 Units Subcutaneous Q8H   lidocaine  1 patch Transdermal Q24H   metoCLOPramide  10 mg Oral TID AC   mometasone-formoterol  1 puff Inhalation BID   nicotine  21 mg Transdermal Daily   pantoprazole  40 mg Oral BID   simethicone  160 mg Oral QID   sodium chloride flush  10-40 mL Intracatheter Q12H   thiamine  100 mg Oral Daily   Or   thiamine  100 mg Intravenous Daily   traZODone  50 mg Oral QHS   Continuous Infusions:  TPN ADULT (ION) 60 mL/hr at 08/29/19 1829   PRN Meds:.acetaminophen, albuterol, alum & mag hydroxide-simeth, cyclobenzaprine, LORazepam, naphazoline-glycerin, ondansetron (ZOFRAN) IV, phenol, sodium chloride flush    Anti-infectives (From admission, onward)   None        Objective:   Vitals:   08/29/19 0742 08/29/19 1350 08/29/19 1405 08/29/19 1602  BP:  108/60 105/77 101/67  Pulse:  85 95 65  Resp:  18 18 18   Temp:  99.5 F (37.5 C) 99.1 F (37.3 C) 99.1 F (37.3 C)  TempSrc:  Oral Oral Oral  SpO2: 96%  99% 100%  Weight:      Height:        Wt Readings from Last 3 Encounters:  08/29/19 36.7 kg  09/18/18 31.8 kg  08/20/18 31.8 kg     Intake/Output Summary (Last 24 hours) at 08/29/2019 1856 Last data filed at 08/29/2019 1829 Gross per 24 hour  Intake 2538.83 ml  Output 750 ml  Net 1788.83 ml     Physical Exam  General exam: Alert, awake, oriented x 3, cachectic Respiratory system: Clear to auscultation. Respiratory effort normal. Cardiovascular system:RRR. No murmurs, rubs, gallops. Gastrointestinal system: Abdomen is nondistended, soft and nontender. No organomegaly or masses felt. Normal bowel sounds heard. Central nervous system: Alert and oriented. No focal neurological deficits. Extremities: No C/C/E, +pedal pulses, PICC line site is clean dry and intact Skin: No rashes,  lesions or ulcers Psychiatry: Judgement and insight appear normal. Mood & affect appropriate.      Data Review:   Micro Results Recent Results (from the past 240 hour(s))  SARS Coronavirus 2 [facility](Hospital order, [facility]) Nasopharyngeal Nasopharyngeal Swab     Status: None   Collection Time: 08/20/19  3:25 PM   Specimen: Nasopharyngeal Swab  Result Value Ref Range Status   SARS Coronavirus 2 NEGATIVE NEGATIVE Final    Comment: (NOTE) If result is NEGATIVE SARS-CoV-2 target nucleic acids are NOT DETECTED. The SARS-CoV-2 RNA is generally detectable in upper and lower  respiratory specimens during the acute phase of infection. The lowest  concentration of SARS-CoV-2 viral copies this assay can detect is 250  copies / mL. A negative result does not preclude SARS-CoV-2 infection  and should not be used as the sole basis for treatment  or other  patient management decisions.  A negative result may occur with  improper specimen collection / handling, submission of specimen other  than nasopharyngeal swab, presence of viral mutation(s) within the  areas targeted by this assay, and inadequate number of viral copies  (<250 copies / mL). A negative result must be combined with clinical  observations, patient history, and epidemiological information. If result is POSITIVE SARS-CoV-2 target nucleic acids are DETECTED. The SARS-CoV-2 RNA is generally detectable in upper and lower  respiratory specimens dur ing the acute phase of infection.  Positive  results are indicative of active infection with SARS-CoV-2.  Clinical  correlation with patient history and other diagnostic information is  necessary to determine patient infection status.  Positive results do  not rule out bacterial infection or co-infection with other viruses. If result is PRESUMPTIVE POSTIVE SARS-CoV-2 nucleic acids MAY BE PRESENT.   A presumptive positive result was obtained on the submitted specimen   and confirmed on repeat testing.  While 2019 novel coronavirus  (SARS-CoV-2) nucleic acids may be present in the submitted sample  additional confirmatory testing may be necessary for epidemiological  and / or clinical management purposes  to differentiate between  SARS-CoV-2 and other Sarbecovirus currently known to infect humans.  If clinically indicated additional testing with an alternate test  methodology [phone]) is advised. The SARS-CoV-2 RNA is generally  detectable in upper and lower respiratory sp ecimens during the acute  phase of infection. The expected result is Negative. Fact Sheet for Patients:  BoilerBrush.com.cy Fact Sheet for Healthcare Providers: https://pope.com/ This test is not yet approved or cleared by the [place] FDA and has been authorized for detection and/or diagnosis of SARS-CoV-2 by FDA under an Emergency Use Authorization (EUA).  This EUA will remain in effect (meaning this test can be used) for the duration of the COVID-19 declaration under Section 564(b)(1) of the Act, 21 U.S.C. section 360bbb-3(b)(1), unless the authorization is terminated or revoked sooner. Performed at [facility], [address] [place], [place] [address]   MRSA PCR Screening     Status: None   Collection Time: 08/20/19  6:27 PM   Specimen: Nasal Mucosa; Nasopharyngeal  Result Value Ref Range Status   MRSA by PCR NEGATIVE NEGATIVE Final    Comment:        The GeneXpert MRSA Assay (FDA approved for NASAL specimens only), is one component of a comprehensive MRSA colonization surveillance program. It is not intended to diagnose MRSA infection nor to guide or monitor treatment for MRSA infections. Performed at [facility], [address], [place], [place] [address]     Radiology Reports Ct Abdomen Pelvis Wo Contrast  Result Date: 08/20/2019 CLINICAL DATA:  Back pain. Nausea and vomiting. Weight loss. Abnormal liver  function. EXAM: CT ABDOMEN AND PELVIS WITHOUT CONTRAST TECHNIQUE: Multidetector CT imaging of the abdomen and pelvis was performed following the standard protocol without IV contrast. COMPARISON:  Multiple exams, including 07/14/2018 and 10/25/2014 FINDINGS: Lower chest: 5 by 4 mm right lower lobe pulmonary nodule along the diaphragm, image 15/4, no change from 10/25/2014, considered benign. Calcified granuloma in the left lower lobe on image 7/4, benign. Hepatobiliary: Cholecystectomy.  Otherwise unremarkable. Pancreas: Considerable atrophy. Spleen: Unremarkable Adrenals/Urinary Tract: Hypodense 1.8 cm left kidney upper pole lesion, likely a cyst, previously 1.6 cm. Adrenal glands unremarkable. No urinary tract calculi are identified. Stomach/Bowel: Postoperative findings including gastrojejunostomy at not stenotic staple lines in upper pelvic loops of bowel Vascular/Lymphatic: Aortoiliac atherosclerotic vascular disease. No  well-defined adenopathy. Reproductive: Uterus is thought to be absent.  Adnexa unremarkable. Other: Severe paucity of adipose tissue favoring cachexia. This makes separation of adjacent abdominal structures such as bowel loops problematic. Musculoskeletal: Right hip screw. Bony demineralization. Interval mild inferior endplate compression fractures at L3 and L4. Interval mild superior endplate compression fracture at L1. Old superior endplate compression fracture L5. IMPRESSION: 1. Interval endplate compression fractures at L1, L3, and L4, compatible with osteoporosis. 2. Prior gastrojejunostomy, along with anastomotic staple line along what is likely a small bowel loop in the pelvis. The bowel loops are difficult to separate due to the patient's striking paucity of bodily adipose tissue including intra-abdominal adipose tissue. There is formed stool in the colon and I do not see definite dilated bowel. 3. Left kidney upper pole cyst.  No definite urinary tract calculi. 4.  Aortic  Atherosclerosis (ICD10-I70.0). Electronically Signed   By: Gaylyn Rong M.D.   On: 08/20/2019 17:18   Dg Abdomen Acute W/chest  Result Date: 08/20/2019 CLINICAL DATA:  Abdominal pain EXAM: DG ABDOMEN ACUTE W/ 1V CHEST COMPARISON:  Abdomen series December 26, 2017 FINDINGS: PA chest: Lungs are hyperexpanded. There are several scattered small calcified granulomas. There is no edema or consolidation. Heart size and pulmonary vascularity are normal. No adenopathy. There old healed rib fractures bilaterally. There is midthoracic dextroscoliosis. Supine and upright abdomen: There are multiple surgical clips in the abdomen and pelvis. There is moderate stool in the colon. There is no bowel dilatation or air-fluid level to suggest bowel obstruction. No free air. Postoperative changes noted in the proximal right femur. Bones are osteoporotic. IMPRESSION: No bowel obstruction or free air. Moderate stool in colon. Extensive postoperative change in the abdomen and pelvis. Lungs mildly hyperexpanded without edema or consolidation. Scattered calcified granulomas noted. Electronically Signed   By: Bretta Bang III M.D.   On: 08/20/2019 13:23   [place] Ekg Site Rite  Result Date: 08/22/2019 If Site Rite image not attached, placement could not be confirmed due to current cardiac rhythm.    CBC Recent Labs  Lab 08/24/19 0410 08/25/19 0441 08/26/19 0705 08/27/19 0624 08/29/19 0434  WBC 8.0 8.4 6.0 5.0 4.3  HGB 7.9* 8.2* 7.7* 7.9* 6.9*  HCT 25.2* 26.0* 25.1* 25.4* 22.2*  PLT 164 172 154 164 164  MCV 94.7 94.2 97.7 97.3 98.2  MCH 29.7 29.7 30.0 30.3 30.5  MCHC 31.3 31.5 30.7 31.1 31.1  RDW 21.1* 21.0* 20.7* 20.7* 19.9*  LYMPHSABS 1.8 1.3  --  1.5  --   MONOABS 0.4 0.3  --  0.6  --   EOSABS 0.1 0.1  --  0.1  --   BASOSABS 0.0 0.0  --  0.0  --     Chemistries  Recent Labs  Lab 08/24/19 0410 08/25/19 0441 08/26/19 0705 08/27/19 0624 08/28/19 0900  NA 144 143 139 137 136  K 3.6 3.9 5.0 4.8 4.4    CL 117* 116* 112* 109 104  CO2 22 23 24 24 27   GLUCOSE 100* 112* 101* 96 105*  BUN 9 9 11 15 18   CREATININE 0.38* 0.36* 0.32* 0.35* 0.37*  CALCIUM 6.8* 7.0* 7.3* 7.7* 8.1*  MG 1.2* 1.7  --  1.9  --   AST 41 32 40 37 55*  ALT 134* 113* 95* 79* 84*  ALKPHOS 50 58 53 45 53  BILITOT 0.8 1.0 0.4 0.4 0.4   ------------------------------------------------------------------------------------------------------------------ Recent Labs    08/27/19 0624  TRIG 56    No results  found for: HGBA1C ------------------------------------------------------------------------------------------------------------------ No results for input(s): TSH, T4TOTAL, T3FREE, THYROIDAB in the last 72 hours.  Invalid input(s): FREET3 ------------------------------------------------------------------------------------------------------------------ No results for input(s): VITAMINB12, FOLATE, FERRITIN, TIBC, IRON, RETICCTPCT in the last 72 hours.  Coagulation profile No results for input(s): INR, PROTIME in the last 168 hours.  No results for input(s): DDIMER in the last 72 hours.  Cardiac Enzymes No results for input(s): CKMB, TROPONINI, MYOGLOBIN in the last 168 hours.  Invalid input(s): CK ------------------------------------------------------------------------------------------------------------------ No results found for: BNP   Roxan Hockey M.D on 08/29/2019 at 6:56 PM  Go to [email or web address] - for contact info  [facility] - Office  [phone]

## 2019-08-30 DIAGNOSIS — J439 Emphysema, unspecified: Secondary | ICD-10-CM

## 2019-08-30 DIAGNOSIS — E43 Unspecified severe protein-calorie malnutrition: Secondary | ICD-10-CM

## 2019-08-30 LAB — CBC
HCT: 29.7 % — ABNORMAL LOW (ref 36.0–46.0)
Hemoglobin: 9.4 g/dL — ABNORMAL LOW (ref 12.0–15.0)
MCH: 30.7 pg (ref 26.0–34.0)
MCHC: 31.6 g/dL (ref 30.0–36.0)
MCV: 97.1 fL (ref 80.0–100.0)
Platelets: 214 10*3/uL (ref 150–400)
RBC: 3.06 MIL/uL — ABNORMAL LOW (ref 3.87–5.11)
RDW: 19.1 % — ABNORMAL HIGH (ref 11.5–15.5)
WBC: 4.6 10*3/uL (ref 4.0–10.5)
nRBC: 0 % (ref 0.0–0.2)

## 2019-08-30 LAB — BPAM RBC
Blood Product Expiration Date: 202010032359
ISSUE DATE / TIME: 202009231346
Unit Type and Rh: 600

## 2019-08-30 LAB — COMPREHENSIVE METABOLIC PANEL
ALT: 68 U/L — ABNORMAL HIGH (ref 0–44)
AST: 38 U/L (ref 15–41)
Albumin: 2.8 g/dL — ABNORMAL LOW (ref 3.5–5.0)
Alkaline Phosphatase: 44 U/L (ref 38–126)
Anion gap: 5 (ref 5–15)
BUN: 18 mg/dL (ref 8–23)
CO2: 27 mmol/L (ref 22–32)
Calcium: 8.5 mg/dL — ABNORMAL LOW (ref 8.9–10.3)
Chloride: 105 mmol/L (ref 98–111)
Creatinine, Ser: 0.33 mg/dL — ABNORMAL LOW (ref 0.44–1.00)
GFR calc Af Amer: >60 mL/min (ref >60)
GFR calc non Af Amer: >60 mL/min (ref >60)
Glucose, Bld: 71 mg/dL (ref 70–99)
Potassium: 4.3 mmol/L (ref 3.5–5.1)
Sodium: 137 mmol/L (ref 135–145)
Total Bilirubin: 0.5 mg/dL (ref 0.3–1.2)
Total Protein: 5.4 g/dL — ABNORMAL LOW (ref 6.5–8.1)

## 2019-08-30 LAB — TYPE AND SCREEN
ABO/RH(D): A NEG
Antibody Screen: NEGATIVE
Unit division: 0

## 2019-08-30 LAB — GLUCOSE, CAPILLARY
Glucose-Capillary: 102 mg/dL — ABNORMAL HIGH (ref 70–99)
Glucose-Capillary: 94 mg/dL (ref 70–99)
Glucose-Capillary: 73 mg/dL (ref 70–99)

## 2019-08-30 LAB — PHOSPHORUS: Phosphorus: 3.7 mg/dL (ref 2.5–4.6)

## 2019-08-30 LAB — MAGNESIUM: Magnesium: 1.8 mg/dL (ref 1.7–2.4)

## 2019-08-30 MED ORDER — ALPRAZOLAM 0.5 MG PO TABS
0.5000 mg | ORAL_TABLET | Freq: Two times a day (BID) | ORAL | Status: DC | PRN
  Administered 2019-08-30 – 2019-08-31 (×3): 0.5 mg via ORAL
  Filled 2019-08-30 (×3): qty 1

## 2019-08-30 MED ORDER — MAGNESIUM SULFATE IN D5W 1-5 GM/100ML-% IV SOLN
1.0000 g | Freq: Once | INTRAVENOUS | Status: AC
  Administered 2019-08-30: 1 g via INTRAVENOUS
  Filled 2019-08-30: qty 100

## 2019-08-30 MED ORDER — MIRTAZAPINE 15 MG PO TABS
15.0000 mg | ORAL_TABLET | Freq: Every day | ORAL | Status: DC
  Administered 2019-08-30: 15 mg via ORAL
  Filled 2019-08-30: qty 1

## 2019-08-30 MED ORDER — TRAVASOL 10 % IV SOLN
INTRAVENOUS | Status: DC
  Administered 2019-08-30: 19:00:00 via INTRAVENOUS
  Filled 2019-08-30: qty 691.2

## 2019-08-30 MED ORDER — METOCLOPRAMIDE HCL 10 MG PO TABS
10.0000 mg | ORAL_TABLET | Freq: Three times a day (TID) | ORAL | Status: DC
  Administered 2019-08-30 – 2019-08-31 (×4): 10 mg via ORAL
  Filled 2019-08-30 (×5): qty 1

## 2019-08-30 NOTE — Progress Notes (Signed)
Physical Therapy Treatment Patient Details Name: Kristine Roberts MRN: [id number] DOB: 05-Feb-1956 Today's Date: 08/30/2019    History of Present Illness Kristine Roberts  is a 63 y.o. female,  w h/o [name], Hiatal hernia, Esophagitis,  Gastric outlet obstruction, PUD, Copd, apparently c/o back pain, and also nausea, emesis over the past year.  Pt states that has not been able to eat well.  Pt generally weak due to poor po intake.    PT Comments     Patient tolerated session well and very agreeable to therapy. Demonstrated improved endurance ambulating 150 feet with supervision. Pain in hip and low back kept patient from continued ambulation. Patient returned to bed EOS per patient request as she did not want to sit up in chair. Patient would continue to benefit from skilled physical therapy focusing on LE strength and endurance to continue to work towards therapy goals.  Follow Up Recommendations  Home health PT;Outpatient PT     Equipment Recommendations  None recommended by PT    Recommendations for Other Services       Precautions / Restrictions Restrictions Weight Bearing Restrictions: No    Mobility  Bed Mobility                  Transfers Overall transfer level: Modified independent Equipment used: None             General transfer comment: increased time  Ambulation/Gait Ambulation/Gait assistance: Supervision Gait Distance (Feet): 150 Feet Assistive device: None Gait Pattern/deviations: Decreased step length - right;Decreased step length - left;Decreased stride length Gait velocity: decreased   General Gait Details: slightly labored cadence, veers to the left with ambulation   Stairs             Wheelchair Mobility    Modified Rankin (Stroke Patients Only)       Balance Overall balance assessment: Mild deficits observed, not formally tested                                          Cognition Arousal/Alertness:  Awake/alert Behavior During Therapy: WFL for tasks assessed/performed Overall Cognitive Status: Within Functional Limits for tasks assessed                                        Exercises General Exercises - Lower Extremity Long Arc Quad: AROM;Strengthening;Both;Seated;10 reps Hip Flexion/Marching: AROM;Strengthening;Both;Seated;10 reps Toe Raises: AROM;Strengthening;Both;Seated;10 reps Heel Raises: AROM;Strengthening;Both;Seated;10 reps    General Comments        Pertinent Vitals/Pain Pain Assessment: 0-10 Pain Score: 8  Pain Location: low back and right hip Pain Descriptors / Indicators: Aching Pain Intervention(s): Limited activity within patient's tolerance    Home Living                      Prior Function            PT Goals (current goals can now be found in the care plan section) Acute Rehab PT Goals Patient Stated Goal: return home with boyfriend to assist PT Goal Formulation: With patient Time For Goal Achievement: 09/03/19 Potential to Achieve Goals: Good    Frequency    Min 1X/week      PT Plan Current plan remains appropriate    Co-evaluation  AM-PAC PT "6 Clicks" Mobility   Outcome Measure  Help needed turning from your back to your side while in a flat bed without using bedrails?: None Help needed moving from lying on your back to sitting on the side of a flat bed without using bedrails?: None Help needed moving to and from a bed to a chair (including a wheelchair)?: None Help needed standing up from a chair using your arms (e.g., wheelchair or bedside chair)?: None Help needed to walk in hospital room?: None Help needed climbing 3-5 steps with a railing? : A Little 6 Click Score: 23    End of Session   Activity Tolerance: Patient tolerated treatment well;Patient limited by pain Patient left: in bed;with call bell/phone within reach Nurse Communication: Mobility status PT Visit Diagnosis:  Unsteadiness on feet (R26.81);Other abnormalities of gait and mobility (R26.89);Muscle weakness (generalized) (M62.81)     Time: 6301-6010 PT Time Calculation (min) (ACUTE ONLY): 15 min  Charges:  $Therapeutic Activity: 8-22 mins                      3:02 PM, 08/30/19 Jerene Pitch, DPT Physical Therapy with [facility]  [phone] office

## 2019-08-30 NOTE — Progress Notes (Signed)
  Subjective:  Patient feels better.  She denies heartburn nausea or vomiting.  She says epigastric pain is much better.  She wants to try real food.  She has been anxious today and given anxiolytic.  She says her anxiety is coming from the fact that she cannot eat regular food.  Patient told that the situation is temporary and she should be able to go back on regular diet when peptic ulcer disease is healed and stricture has opened up.  Objective: Blood pressure 109/74, pulse 61, temperature 98.4 F (36.9 C), temperature source Oral, resp. rate 17, height 5' (1.524 m), weight 38.4 kg, SpO2 100 %. Patient is alert and in no acute distress. She has mild upper extremity tremors.  Labs/studies Results:  CBC Latest Ref Rng & Units 08/30/2019 08/29/2019 08/27/2019  WBC 4.0 - 10.5 K/uL 4.6 4.3 5.0  Hemoglobin 12.0 - 15.0 g/dL 9.4(L) 6.9(LL) 7.9(L)  Hematocrit 36.0 - 46.0 % 29.7(L) 22.2(L) 25.4(L)  Platelets 150 - 400 K/uL 214 164 164    CMP Latest Ref Rng & Units 08/30/2019 08/28/2019 08/27/2019  Glucose 70 - 99 mg/dL 71 105(H) 96  BUN 8 - 23 mg/dL '18 18 15  '$ Creatinine 0.44 - 1.00 mg/dL 0.33(L) 0.37(L) 0.35(L)  Sodium 135 - 145 mmol/L 137 136 137  Potassium 3.5 - 5.1 mmol/L 4.3 4.4 4.8  Chloride 98 - 111 mmol/L 105 104 109  CO2 22 - 32 mmol/L '27 27 24  '$ Calcium 8.9 - 10.3 mg/dL 8.5(L) 8.1(L) 7.7(L)  Total Protein 6.5 - 8.1 g/dL 5.4(L) 5.5(L) 4.6(L)  Total Bilirubin 0.3 - 1.2 mg/dL 0.5 0.4 0.4  Alkaline Phos 38 - 126 U/L 44 53 45  AST 15 - 41 U/L 38 55(H) 37  ALT 0 - 44 U/L 68(H) 84(H) 79(H)    Hepatic Function Latest Ref Rng & Units 08/30/2019 08/28/2019 08/27/2019  Total Protein 6.5 - 8.1 g/dL 5.4(L) 5.5(L) 4.6(L)  Albumin 3.5 - 5.0 g/dL 2.8(L) 2.8(L) 2.5(L)  AST 15 - 41 U/L 38 55(H) 37  ALT 0 - 44 U/L 68(H) 84(H) 79(H)  Alk Phosphatase 38 - 126 U/L 44 53 45  Total Bilirubin 0.3 - 1.2 mg/dL 0.5 0.4 0.4     Assessment:  #1.  Gastric outlet obstruction secondary to gastrojejunal  anastomotic ulcer status post balloon dilation to 12 mm 6 days ago.  She is tolerating full liquids.  Oral feeding not enough to meet her caloric needs.  She has been on TPN since 08/24/2019.  I doubt that patient would be able to afford another 3 weeks of TPN.  If this is the case we may have to discontinue TPN and let her try pured diet.  #2.  Anemia felt to be multifactorial.  She may also be losing blood from peptic ulcer disease.  She received a unit of PRBCs yesterday.  #3.  Severe malnutrition.  Her BMI on admission was less than 12.  It is gradually creeping up.  She has been on TPN for 6 days.  She may not qualify for TPN for 1 month.  If this is the case TPN will be discontinued tomorrow and she can go home on pured diet.  Recommendations  We will plan EGD with stricture dilation in in 3 weeks or 4 weeks from her last procedure. She can be weaned off TPN tomorrow and go home on pured diet and p.o. medications. Patient aware not to go back to taking BC powder or cigarette smoking

## 2019-08-30 NOTE — TOC Progression Note (Addendum)
Transition of Care [facility]) - Progression Note    Patient Details  Name: Kristine Roberts MRN: [id number] Date of Birth: [birth year]  [facility] CM/SW Contact  Takahiro Godinho, Chauncey Reading, RN Phone Number: 08/30/2019, 4:17 PM  Clinical Narrative:  Received information that hospital can not pay for TPN as patient does have insurance coverage and this will be a violation of the [employer].   Only other option would be SNF. Unsure if patient would agree or if any SNF's would make a bed offer. Will discuss with patient.    ADDENDUM:  4818: Spoke with patient, she is agreeable to [facility] only. Referral made. Will follow up in the AM for decision from [facility].   If [facility] declines, patient elects to go home.    Expected Discharge Plan: [place] Barriers to Discharge: No Barriers Identified  Expected Discharge Plan and Services Expected Discharge Plan: [place]   Discharge Planning Services: Follow-up appt scheduled, CM Consult Post Acute Care Choice: [place] arrangements for the past 2 months: Single Family Home                           HH Arranged: RN, PT, TPN HH Agency: [place] ([facility]) Date HH Agency Contacted: 08/28/19 Time [place]: 1027 Representative spoke with at [place]: [place] ([place]) Interventions    Readmission Risk Interventions No flowsheet data found.

## 2019-08-30 NOTE — Progress Notes (Signed)
PHARMACY - ADULT TOTAL PARENTERAL NUTRITION CONSULT NOTE   Pharmacy Consult for TPN Indication: NPO=>Malnutrition and Gastric Outlet Obstruction  Patient Measurements: Height: 5' (152.4 cm) Weight: 84 lb 11.2 oz (38.4 kg) IBW/kg (Calculated) : 45.5 TPN AdjBW (KG): 34 Body mass index is 16.54 kg/m. Usual Weight: 34kg  Assessment: Gastric outlet obstruction secondary to circumferential gastrojejunal anastomotic ulcer with stenosis . Pt with no complaints and and no N/V. No signs of infection. Will initiate lipids along with CHO/Pro slowly d/t concern for refeeding syndrome. Glucose stable, magnesium to be replaced, phosphorus to be replaced.  Repeat EGD 9/18 with dilation of the pylorus and removal of NG tube.  She was started on clear liquids.  Advancing diet per GI. GI plans to continue TPN for 1 month or potentially longer. GI: GOO Endo: EGD with dilatation 9/18  Insulin requirements in the past 24 hours:  0 unit Lytes:  K 3.6>>3.9>>5 >> 4.8> 4.4, Phos 1.2>>1.7>> 1.9>>2.6> 3.7 , mag 1.2>> 1.7> 1.9>> 1.8 Renal:stable Hepatobil: T.BILI within nl. LFTS improved ID: no infection  TPN Access:PICC double lumen TPN start date:08/24/2019 Nutritional Goals (per RD recommendation on 9/18): KGMW:[id number] (40-45 kcal/kg) Protein: 61-75 grams protein(1.8-2.2g/kg) Fluid:1.4-1.5L  Goal TPN rate is 60 ml/hr   Current Nutrition: clear liquis  Plan:  Continue TPN at 83mL/hr. This TPN provides 69g of protein, 216g of dextrose, and 43g of lipids which provides 1441 kCals per day, meeting ~100% of patient needs Electrolytes in TPN:  decrease Phosphate Magnesium Sulfate 1gm IV x 1 Add MVI, Trace elements MWF  TID SSI and adjust as needed Monitor TPN labs F/U labs,  Isac Sarna, BS Vena [place], BCPS Clinical Pharmacist Pager [phone] 08/30/2019,10:10 AM

## 2019-08-30 NOTE — Progress Notes (Signed)
Patient Demographics:    Kristine Roberts, is a 63 y.o. female, DOB - 1956-04-07, KLK:[id number]  Admit date - 08/20/2019   Admitting Physician Pearson Grippe, MD  Outpatient Primary MD for the patient is [facility], [facility]  LOS - 10   Chief Complaint  Patient presents with  . Back Pain        Subjective:    [name] she does have intermittent nausea, but no vomiting.  Tolerating liquids. -Tolerating TPN IV infusion well  Assessment  & Plan :    Principal Problem:   Hypotension Active Problems:   COPD (chronic obstructive pulmonary disease) (HCC)   Protein-calorie malnutrition, severe   Hypokalemia   Cachexia (HCC)   ETOH abuse   Nausea   Abnormal liver function   Hyponatremia   Dehydration  Brief Summary 63 y.o. female,  w h/o [name], Hiatal hernia, Esophagitis, prior gastrojejunostomy Gastric outlet obstruction, PUD, Copd admitted on 08/20/2019 with dehydration and hypotension as well as elevated LFTs in the setting of vomiting and poor oral intake   A/p 1)Hypotension--hypotension-mostly resolved, a.m. cortisol level is 15.1,  -echo with preserved EF over 50%. -Troponins are flat at 55 and 53 - suspect hypotension is secondary to dehydration and poor oral intake, continue IV fluids continue to encourage oral intake -   2) elevated LFTs--- AST is down to 38 from 555, ALT is down to 68 from 737--- -bilirubin is not elevated,  -Lipase is 59 -Acetaminophen level was 42.. -Acute viral hepatitis panel negative -Discussed with Dr. Karilyn Cota from GI service -CT abdomen and pelvis with status post cholecystectomy otherwise no acute findings from a hepatobiliary standpoint --  3)Low back pain--patient with endplate compression fractures at L1 L3 and L4 compatible with osteoporosis--- -back pain appears to be reasonably controlled and she is ambulating with a walker.  4)FTT/severe  protein caloric malnutrition ---- BMI up to 16 from 11 (?? Accuracy of weight), dietitian consult requested ., c/n supplements.  Currently on supplemental TPN -Weight apparently is up to 84.7 pounds from 65 pounds on 08/21/2019  5)H/o Etoh Abuse-lorazepam per CIWA protocol, continue multivitamin including thiamine and folic acid.  No signs of withdrawal at this time  6) COPD and tobacco abuse--- no acute flareup at this time continue bronchodilators  7)Hypovolemic Hyponatremia--- resolved with IV hydration  8)Intractable Emesis--patient with history of PUD, prior esophagitis and gastric outlet obstruction--- patient underwent EGD on 9/16 which noted gastric outlet obstruction secondary to circumferential gastrojejunal anastomotic ulcer with stenosis.  NG tube was placed for decompression.  Repeat EGD 9/18 with dilation of the pylorus and removal of NG tube.  Per GI, she is currently on liquid diet.  PICC line has been placed and she is currently on supplemental TPN.  She will need to continue TPN for the next 4 weeks.  9)hypophosphatemia/hypokalemia/hypomagnesemia-- suspect due to refeeding syndrome.  Replace and recheck  10) symptomatic acute on chronic anemia, suspect this is related to chronic disease. downtrending hemoglobin likely related to hemodilution. No signs of bleeding.  -Patient is less dizzy, -Hemoglobin is up to 9.4 from 6.9, after transfusion of 1 unit of packed cells   Disposition/Need for in-Hospital Stay-discharge home once arrangements for home TPN are made--continues to require IV TPN, apparently  insurance will not pay for TPN at home at this time, patient may be able to go to SNF and get TPN at SNF  Code Status : Full  Family Communication:   NA (patient is alert, awake and coherent)   Disposition Plan  : Discharge home Vs SNF once home TPN arrangements have been made  Consults  :  Gi  DVT Prophylaxis  :   -   - SCDs   Lab Results  Component Value Date   PLT 214  08/30/2019    Inpatient Medications  Scheduled Meds: . albuterol  2.5 mg Nebulization BID  . Chlorhexidine Gluconate Cloth  6 each Topical Daily  . citalopram  20 mg Oral Daily  . feeding supplement  1 Container Oral BID BM  . feeding supplement (PRO-STAT SUGAR FREE 64)  30 mL Oral BID  . folic acid  1 mg Oral Daily  . insulin aspart  0-9 Units Subcutaneous Q8H  . lidocaine  1 patch Transdermal Q24H  . metoCLOPramide  10 mg Oral TID AC  . mirtazapine  15 mg Oral QHS  . mometasone-formoterol  1 puff Inhalation BID  . nicotine  21 mg Transdermal Daily  . pantoprazole  40 mg Oral BID  . simethicone  160 mg Oral QID  . sodium chloride flush  10-40 mL Intracatheter Q12H  . thiamine  100 mg Oral Daily   Or  . thiamine  100 mg Intravenous Daily   Continuous Infusions: . TPN ADULT (ION) 60 mL/hr at 08/29/19 1829  . TPN ADULT (ION)     PRN Meds:.acetaminophen, albuterol, ALPRAZolam, alum & mag hydroxide-simeth, cyclobenzaprine, naphazoline-glycerin, ondansetron (ZOFRAN) IV, phenol, sodium chloride flush    Anti-infectives (From admission, onward)   None        Objective:   Vitals:   08/30/19 0508 08/30/19 0730 08/30/19 0738 08/30/19 1303  BP: 103/66   109/74  Pulse: 60   61  Resp: 16   17  Temp: 97.8 F (36.6 C)   98.4 F (36.9 C)  TempSrc: Oral   Oral  SpO2: 100% 100% 100% 100%  Weight:      Height:        Wt Readings from Last 3 Encounters:  08/30/19 38.4 kg  09/18/18 31.8 kg  08/20/18 31.8 kg     Intake/Output Summary (Last 24 hours) at 08/30/2019 1752 Last data filed at 08/30/2019 1426 Gross per 24 hour  Intake 3202.64 ml  Output 650 ml  Net 2552.64 ml    Physical Exam General exam: Alert, awake, oriented x 3, cachectic Respiratory system: Clear to auscultation. Respiratory effort normal. Cardiovascular system:RRR. No murmurs, rubs, gallops. Gastrointestinal system: Abdomen is nondistended, soft and nontender. No organomegaly or masses felt. Normal  bowel sounds heard. Central nervous system: Alert and oriented. No focal neurological deficits. Extremities: No C/C/E, +pedal pulses, PICC line site is clean dry and intact Skin: No rashes, lesions or ulcers Psychiatry: Judgement and insight appear normal. Mood & affect appropriate.      Data Review:   Micro Results Recent Results (from the past 240 hour(s))  MRSA PCR Screening     Status: None   Collection Time: 08/20/19  6:27 PM   Specimen: Nasal Mucosa; Nasopharyngeal  Result Value Ref Range Status   MRSA by PCR NEGATIVE NEGATIVE Final    Comment:        The GeneXpert MRSA Assay (FDA approved for NASAL specimens only), is one component of a comprehensive MRSA colonization surveillance program.  It is not intended to diagnose MRSA infection nor to guide or monitor treatment for MRSA infections. Performed at [facility], [address] [place], [place] [address]     Radiology Reports Ct Abdomen Pelvis Wo Contrast  Result Date: 08/20/2019 CLINICAL DATA:  Back pain. Nausea and vomiting. Weight loss. Abnormal liver function. EXAM: CT ABDOMEN AND PELVIS WITHOUT CONTRAST TECHNIQUE: Multidetector CT imaging of the abdomen and pelvis was performed following the standard protocol without IV contrast. COMPARISON:  Multiple exams, including 07/14/2018 and 10/25/2014 FINDINGS: Lower chest: 5 by 4 mm right lower lobe pulmonary nodule along the diaphragm, image 15/4, no change from 10/25/2014, considered benign. Calcified granuloma in the left lower lobe on image 7/4, benign. Hepatobiliary: Cholecystectomy.  Otherwise unremarkable. Pancreas: Considerable atrophy. Spleen: Unremarkable Adrenals/Urinary Tract: Hypodense 1.8 cm left kidney upper pole lesion, likely a cyst, previously 1.6 cm. Adrenal glands unremarkable. No urinary tract calculi are identified. Stomach/Bowel: Postoperative findings including gastrojejunostomy at not stenotic staple lines in upper pelvic loops of bowel  Vascular/Lymphatic: Aortoiliac atherosclerotic vascular disease. No well-defined adenopathy. Reproductive: Uterus is thought to be absent.  Adnexa unremarkable. Other: Severe paucity of adipose tissue favoring cachexia. This makes separation of adjacent abdominal structures such as bowel loops problematic. Musculoskeletal: Right hip screw. Bony demineralization. Interval mild inferior endplate compression fractures at L3 and L4. Interval mild superior endplate compression fracture at L1. Old superior endplate compression fracture L5. IMPRESSION: 1. Interval endplate compression fractures at L1, L3, and L4, compatible with osteoporosis. 2. Prior gastrojejunostomy, along with anastomotic staple line along what is likely a small bowel loop in the pelvis. The bowel loops are difficult to separate due to the patient's striking paucity of bodily adipose tissue including intra-abdominal adipose tissue. There is formed stool in the colon and I do not see definite dilated bowel. 3. Left kidney upper pole cyst.  No definite urinary tract calculi. 4.  Aortic Atherosclerosis (ICD10-I70.0). Electronically Signed   By: Gaylyn RongWalter  Liebkemann M.D.   On: 08/20/2019 17:18   Dg Abdomen Acute W/chest  Result Date: 08/20/2019 CLINICAL DATA:  Abdominal pain EXAM: DG ABDOMEN ACUTE W/ 1V CHEST COMPARISON:  Abdomen series December 26, 2017 FINDINGS: PA chest: Lungs are hyperexpanded. There are several scattered small calcified granulomas. There is no edema or consolidation. Heart size and pulmonary vascularity are normal. No adenopathy. There old healed rib fractures bilaterally. There is midthoracic dextroscoliosis. Supine and upright abdomen: There are multiple surgical clips in the abdomen and pelvis. There is moderate stool in the colon. There is no bowel dilatation or air-fluid level to suggest bowel obstruction. No free air. Postoperative changes noted in the proximal right femur. Bones are osteoporotic. IMPRESSION: No bowel  obstruction or free air. Moderate stool in colon. Extensive postoperative change in the abdomen and pelvis. Lungs mildly hyperexpanded without edema or consolidation. Scattered calcified granulomas noted. Electronically Signed   By: Bretta BangWilliam  Woodruff III M.D.   On: 08/20/2019 13:23   [place] Ekg Site Rite  Result Date: 08/22/2019 If Site Rite image not attached, placement could not be confirmed due to current cardiac rhythm.    CBC Recent Labs  Lab 08/24/19 0410 08/25/19 0441 08/26/19 0705 08/27/19 0624 08/29/19 0434 08/30/19 0521  WBC 8.0 8.4 6.0 5.0 4.3 4.6  HGB 7.9* 8.2* 7.7* 7.9* 6.9* 9.4*  HCT 25.2* 26.0* 25.1* 25.4* 22.2* 29.7*  PLT 164 172 154 164 164 214  MCV 94.7 94.2 97.7 97.3 98.2 97.1  MCH 29.7 29.7 30.0 30.3 30.5 30.7  MCHC 31.3  31.5 30.7 31.1 31.1 31.6  RDW 21.1* 21.0* 20.7* 20.7* 19.9* 19.1*  LYMPHSABS 1.8 1.3  --  1.5  --   --   MONOABS 0.4 0.3  --  0.6  --   --   EOSABS 0.1 0.1  --  0.1  --   --   BASOSABS 0.0 0.0  --  0.0  --   --     Chemistries  Recent Labs  Lab 08/24/19 0410 08/25/19 0441 08/26/19 0705 08/27/19 0624 08/28/19 0900 08/30/19 0521  NA 144 143 139 137 136 137  K 3.6 3.9 5.0 4.8 4.4 4.3  CL 117* 116* 112* 109 104 105  CO2 22 23 24 24 27 27   GLUCOSE 100* 112* 101* 96 105* 71  BUN 9 9 11 15 18 18   CREATININE 0.38* 0.36* 0.32* 0.35* 0.37* 0.33*  CALCIUM 6.8* 7.0* 7.3* 7.7* 8.1* 8.5*  MG 1.2* 1.7  --  1.9  --  1.8  AST 41 32 40 37 55* 38  ALT 134* 113* 95* 79* 84* 68*  ALKPHOS 50 58 53 45 53 44  BILITOT 0.8 1.0 0.4 0.4 0.4 0.5   ------------------------------------------------------------------------------------------------------------------ No results for input(s): CHOL, HDL, LDLCALC, TRIG, CHOLHDL, LDLDIRECT in the last 72 hours.  No results found for: HGBA1C ------------------------------------------------------------------------------------------------------------------ No results for input(s): TSH, T4TOTAL, T3FREE, THYROIDAB in the  last 72 hours.  Invalid input(s): FREET3 ------------------------------------------------------------------------------------------------------------------ No results for input(s): VITAMINB12, FOLATE, FERRITIN, TIBC, IRON, RETICCTPCT in the last 72 hours.  Coagulation profile No results for input(s): INR, PROTIME in the last 168 hours.  No results for input(s): DDIMER in the last 72 hours.  Cardiac Enzymes No results for input(s): CKMB, TROPONINI, MYOGLOBIN in the last 168 hours.  Invalid input(s): CK ------------------------------------------------------------------------------------------------------------------ No results found for: BNP  Shon Hale M.D on 08/30/2019 at 5:52 PM  Go to [email or web address] - for contact info  [facility] - Office  [phone]

## 2019-08-30 NOTE — TOC Progression Note (Addendum)
Transition of Care [facility]) - Progression Note    Patient Details  Name: Emiliana Blaize MRN: [id number] Date of Birth: [birth year]  Transition of Care [facility]) CM/SW Contact  Quintyn Dombek, Chauncey Reading, RN Phone Number: 08/30/2019, 1:24 PM  Clinical Narrative:   Patient will not have insurance coverage for TPN.  Discussed with Janett Billow, supervisor in regards to possible LOG for TPN. Patient has had 1 week of TPN so far, so will need 3 more weeks.  Price for 3 weeks of TPN, plus pump and supplies will be $3213.   Will await decision.   Discussed with attending and Dr. Lucianne Muss. If approved or not approved, patient will DC home tomorrow .  Home Health RN and PT arranged with [facility].   ADDENDUM: Received another quote from [employer], cost will be $3150.    Expected Discharge Plan: [place] Barriers to Discharge: No Barriers Identified  Expected Discharge Plan and Services Expected Discharge Plan: [place]   Discharge Planning Services: Follow-up appt scheduled, CM Consult Post Acute Care Choice: [place] arrangements for the past 2 months: Single Family Home                           HH Arranged: RN, PT, TPN HH Agency: [facility] Date HH Agency Contacted: 08/28/19 Time [place]: 1027 Representative spoke with at [place]: [place] ([place]) Interventions    Readmission Risk Interventions No flowsheet data found.

## 2019-08-31 DIAGNOSIS — F1010 Alcohol abuse, uncomplicated: Secondary | ICD-10-CM

## 2019-08-31 LAB — CBC
HCT: 33 % — ABNORMAL LOW (ref 36.0–46.0)
Hemoglobin: 10.3 g/dL — ABNORMAL LOW (ref 12.0–15.0)
MCH: 30.1 pg (ref 26.0–34.0)
MCHC: 31.2 g/dL (ref 30.0–36.0)
MCV: 96.5 fL (ref 80.0–100.0)
Platelets: 265 10*3/uL (ref 150–400)
RBC: 3.42 MIL/uL — ABNORMAL LOW (ref 3.87–5.11)
RDW: 18.7 % — ABNORMAL HIGH (ref 11.5–15.5)
WBC: 4.4 10*3/uL (ref 4.0–10.5)
nRBC: 0 % (ref 0.0–0.2)

## 2019-08-31 LAB — RENAL FUNCTION PANEL
Albumin: 3.4 g/dL — ABNORMAL LOW (ref 3.5–5.0)
Anion gap: 8 (ref 5–15)
BUN: 19 mg/dL (ref 8–23)
CO2: 27 mmol/L (ref 22–32)
Calcium: 9.1 mg/dL (ref 8.9–10.3)
Chloride: 103 mmol/L (ref 98–111)
Creatinine, Ser: 0.38 mg/dL — ABNORMAL LOW (ref 0.44–1.00)
GFR calc Af Amer: >60 mL/min (ref >60)
GFR calc non Af Amer: >60 mL/min (ref >60)
Glucose, Bld: 111 mg/dL — ABNORMAL HIGH (ref 70–99)
Phosphorus: 5.1 mg/dL — ABNORMAL HIGH (ref 2.5–4.6)
Potassium: 4.8 mmol/L (ref 3.5–5.1)
Sodium: 138 mmol/L (ref 135–145)

## 2019-08-31 LAB — GLUCOSE, CAPILLARY
Glucose-Capillary: 51 mg/dL — ABNORMAL LOW (ref 70–99)
Glucose-Capillary: 69 mg/dL — ABNORMAL LOW (ref 70–99)
Glucose-Capillary: 98 mg/dL (ref 70–99)

## 2019-08-31 MED ORDER — FOLIC ACID 1 MG PO TABS: 1.0000 mg | ORAL_TABLET | Freq: Every day | ORAL | 2 refills | Status: DC

## 2019-08-31 MED ORDER — MIRTAZAPINE 15 MG PO TABS: 15.0000 mg | ORAL_TABLET | Freq: Every day | ORAL | 3 refills | Status: DC

## 2019-08-31 MED ORDER — ONDANSETRON 4 MG PO TBDP: 4.0000 mg | ORAL_TABLET | Freq: Three times a day (TID) | ORAL | 0 refills | Status: DC | PRN

## 2019-08-31 MED ORDER — ALPRAZOLAM 0.5 MG PO TABS: 0.5000 mg | ORAL_TABLET | Freq: Two times a day (BID) | ORAL | 0 refills | Status: DC | PRN

## 2019-08-31 MED ORDER — METOCLOPRAMIDE HCL 10 MG PO TABS: 10.0000 mg | ORAL_TABLET | Freq: Three times a day (TID) | ORAL | 3 refills | Status: DC

## 2019-08-31 MED ORDER — LIDOCAINE 5 % EX PTCH: 1.0000 | MEDICATED_PATCH | CUTANEOUS | 0 refills | Status: DC

## 2019-08-31 MED ORDER — INSULIN ASPART 100 UNIT/ML ~~LOC~~ SOLN: 0.0000 [IU] | Freq: Three times a day (TID) | SUBCUTANEOUS | 11 refills | Status: DC

## 2019-08-31 MED ORDER — CYCLOBENZAPRINE HCL 10 MG PO TABS: 10.0000 mg | ORAL_TABLET | Freq: Three times a day (TID) | ORAL | 0 refills | Status: DC | PRN

## 2019-08-31 MED ORDER — THIAMINE HCL 100 MG PO TABS: 100.0000 mg | ORAL_TABLET | Freq: Every day | ORAL | 2 refills | Status: DC

## 2019-08-31 MED ORDER — PHENOL 1.4 % MT LIQD: 1.0000 | OROMUCOSAL | 0 refills | Status: DC | PRN

## 2019-08-31 MED ORDER — PANTOPRAZOLE SODIUM 40 MG PO TBEC: 40.0000 mg | DELAYED_RELEASE_TABLET | Freq: Two times a day (BID) | ORAL | 2 refills | Status: DC

## 2019-08-31 MED ORDER — NICOTINE 21 MG/24HR TD PT24: 21.0000 mg | MEDICATED_PATCH | Freq: Every day | TRANSDERMAL | 0 refills | Status: DC

## 2019-08-31 MED ORDER — PRO-STAT SUGAR FREE PO LIQD: 30.0000 mL | Freq: Two times a day (BID) | ORAL | 0 refills | Status: DC

## 2019-08-31 MED ORDER — ACETAMINOPHEN 325 MG PO TABS: 650.0000 mg | ORAL_TABLET | Freq: Four times a day (QID) | ORAL | 2 refills | Status: DC | PRN

## 2019-08-31 NOTE — Discharge Instructions (Signed)
1)Severe Protein caloric malnutrition/failure to thrive so Needs --HH RN and  "TPN per AHI protocol for labs and dosing" -Please continue TPN thru 09/20/2019 inclusive  2)Avoid ibuprofen/Advil/Aleve/Motrin/Goody Powders/Naproxen/BC powders/Meloxicam/Diclofenac/Indomethacin and other Nonsteroidal anti-inflammatory medications as these will make you more likely to bleed and can cause stomach ulcers, can also cause Kidney problems.  3) please follow-up with your gastroenterologist Dr. Laural Golden in about 3 weeks for repeat endoscopy 4) repeat CBC and BMP every Monday 5)Novolog Sliding Scale---- 0-9 Units, Subcutaneous, Every 8 hours,  Correction coverage: Sensitive scale CBG < 70: Implement Hypoglycemia Standing Orders and refer to Hypoglycemia Standing Orders sidebar report CBG 70 - 120: 0 units CBG 121 - 150:  0 unit CBG 151 - 200:  0 units CBG 201 - 250:  2 units CBG 251 - 300:  4 units CBG 301 - 350:   6units CBG 351 - 400:   8 units CBG > 400: call MD

## 2019-08-31 NOTE — Care Management Important Message (Signed)
Important Message  Patient Details  Name: Kristine Roberts MRN: [id number] Date of Birth: 1956/10/28   Medicare Important Message Given:  Yes     Tommy Medal 08/31/2019, 1:59 PM

## 2019-08-31 NOTE — NC FL2 (Signed)
[place] LEVEL OF CARE SCREENING TOOL     IDENTIFICATION  Patient Name: Kristine Roberts Birthdate: Oct 13, 1956 Sex: female Admission Date (Current Location): 08/20/2019  [facility] and [place] Number:  [employer] and Address:  [place] [address] [place]      Provider Number: [id number]  Attending Physician Name and Address:  Roxan Hockey, MD  Relative Name and Phone Number:       Current Level of Care: Hospital Recommended Level of Care: [place] Prior Approval Number:    Date Approved/Denied:   PASRR Number:    Discharge Plan: SNF    Current Diagnoses: Patient Active Problem List   Diagnosis Date Noted  . Dehydration 08/22/2019  . Nausea 08/20/2019  . Abnormal liver function 08/20/2019  . Hyponatremia 08/20/2019  . Closed right hip fracture ([place]) 07/08/2018  . Cellulitis of both lower extremities 07/08/2018  . Gastric ulcer 01/11/2018  . Hypokalemia 12/26/2017  . Cachexia ([place]) 12/26/2017  . Hypotension 12/26/2017  . Tobacco abuse 12/26/2017  . ETOH abuse 12/26/2017  . Anemia 12/26/2017  . Pressure injury of skin 12/26/2017  . Protein-calorie malnutrition, severe 03/05/2016  . Gastric outlet obstruction 03/03/2016  . COPD (chronic obstructive pulmonary disease) ([place]) 03/03/2016  . Dysphagia 02/03/2016    Orientation RESPIRATION BLADDER Height & Weight     Self, Time, Situation, Place  Normal Continent Weight: 84 lb 11.2 oz (38.4 kg) Height:  5' (152.4 cm)  BEHAVIORAL SYMPTOMS/MOOD NEUROLOGICAL BOWEL NUTRITION STATUS      Continent Diet(TPN, and full liquid diet)  AMBULATORY STATUS COMMUNICATION OF NEEDS Skin   Limited Assist Verbally Normal                       Personal Care Assistance Level of Assistance  Bathing, Feeding, Dressing Bathing Assistance: Limited assistance Feeding assistance: Independent Dressing Assistance: Limited assistance     Functional  Limitations Info  Sight, Hearing, Speech Sight Info: Adequate Hearing Info: Adequate Speech Info: Adequate    SPECIAL CARE FACTORS FREQUENCY  PT (By licensed PT)     PT Frequency: 3-5 times week              Contractures Contractures Info: Not present    Additional Factors Info  Code Status, Allergies, Psychotropic Code Status Info: full Allergies Info: penicillins Psychotropic Info: celexa         Current Medications (08/31/2019):  This is the current hospital active medication list Current Facility-Administered Medications  Medication Dose Route Frequency Provider Last Rate Last Dose  . acetaminophen (TYLENOL) tablet 650 mg  650 mg Oral Q6H PRN Kathie Dike, MD   650 mg at 08/28/19 0903  . albuterol (PROVENTIL) (2.5 MG/3ML) 0.083% nebulizer solution 2.5 mg  2.5 mg Nebulization BID Kathie Dike, MD   2.5 mg at 08/31/19 0733  . albuterol (PROVENTIL) (2.5 MG/3ML) 0.083% nebulizer solution 3 mL  3 mL Inhalation Q6H PRN Jani Gravel, MD   3 mL at 08/28/19 0848  . ALPRAZolam Duanne Moron) tablet 0.5 mg  0.5 mg Oral BID PRN Roxan Hockey, MD   0.5 mg at 08/31/19 0444  . alum & mag hydroxide-simeth (MAALOX/MYLANTA) 200-200-20 MG/5ML suspension 30 mL  30 mL Oral Q6H PRN Kathie Dike, MD   30 mL at 08/28/19 0332  . Chlorhexidine Gluconate Cloth 2 % PADS 6 each  6 each Topical Daily Roxan Hockey, MD   6 each at 08/31/19 0900  . citalopram (CELEXA) tablet  20 mg  20 mg Oral Daily Fields, Sandi L, MD   20 mg at 08/31/19 0859  . cyclobenzaprine (FLEXERIL) tablet 10 mg  10 mg Oral TID PRN Schorr, Roma Kayser, NP   10 mg at 08/31/19 0444  . feeding supplement (BOOST / RESOURCE BREEZE) liquid 1 Container  1 Container Oral BID BM Malissa Hippo, MD   1 Container at 08/30/19 [phone]  . feeding supplement (PRO-STAT SUGAR FREE 64) liquid 30 mL  30 mL Oral BID Pearson Grippe, MD   30 mL at 08/29/19 2145  . folic acid (FOLVITE) tablet 1 mg  1 mg Oral Daily Pearson Grippe, MD   1 mg at 08/31/19 0858   . insulin aspart (novoLOG) injection 0-9 Units  0-9 Units Subcutaneous Q8H Erick Blinks, MD   1 Units at 08/26/19 1715  . lidocaine (LIDODERM) 5 % 1 patch  1 patch Transdermal Q24H Erick Blinks, MD   1 patch at 08/30/19 1538  . metoCLOPramide (REGLAN) tablet 10 mg  10 mg Oral TID AC Emokpae, Courage, MD   10 mg at 08/31/19 0859  . mirtazapine (REMERON) tablet 15 mg  15 mg Oral QHS Emokpae, Courage, MD   15 mg at 08/30/19 2104  . mometasone-formoterol (DULERA) 200-5 MCG/ACT inhaler 1 puff  1 puff Inhalation BID Pearson Grippe, MD   1 puff at 08/31/19 0746  . naphazoline-glycerin (CLEAR EYES REDNESS) ophth solution 1 drop  1 drop Both Eyes QID PRN Pearson Grippe, MD      . nicotine (NICODERM CQ - dosed in mg/24 hours) patch 21 mg  21 mg Transdermal Daily Pearson Grippe, MD   21 mg at 08/31/19 0858  . ondansetron (ZOFRAN) injection 4 mg  4 mg Intravenous Q6H PRN Pearson Grippe, MD   4 mg at 08/28/19 0354  . pantoprazole (PROTONIX) EC tablet 40 mg  40 mg Oral BID Schorr, Roma Kayser, NP   40 mg at 08/31/19 0858  . phenol (CHLORASEPTIC) mouth spray 1 spray  1 spray Mouth/Throat PRN Erick Blinks, MD   1 spray at 08/22/19 2023  . simethicone (MYLICON) chewable tablet 160 mg  160 mg Oral QID Erick Blinks, MD   160 mg at 08/31/19 0859  . sodium chloride flush (NS) 0.9 % injection 10-40 mL  10-40 mL Intracatheter Q12H Rehman, Najeeb U, MD   10 mL at 08/31/19 0901  . sodium chloride flush (NS) 0.9 % injection 10-40 mL  10-40 mL Intracatheter PRN Malissa Hippo, MD   10 mL at 08/26/19 1117  . thiamine (VITAMIN B-1) tablet 100 mg  100 mg Oral Daily Pearson Grippe, MD   100 mg at 08/31/19 0859  . TPN ADULT (ION)   Intravenous Continuous TPN Shon Hale, MD 60 mL/hr at 08/30/19 1832       Discharge Medications: Please see discharge summary for a list of discharge medications.  Relevant Imaging Results:  Relevant Lab Results:   Additional Information SSN: [id number] [place]

## 2019-08-31 NOTE — TOC Transition Note (Signed)
Transition of Care [facility]) - CM/SW Discharge Note   Patient Details  Name: Kristine Roberts MRN: [id number] Date of Birth: 10/31/1956  Transition of Care [facility]) CM/SW Contact:  Shade Flood, LCSW Phone Number: 08/31/2019, 11:01 AM   Clinical Narrative:     [facility] has offered bed to pt and she has accepted. Updated MD and RN. Pt can transfer today as soon as orders are in. Current TPN supply will be sent with pt. DC clinical will be sent electronically. There are no other TOC needs for dc.  Final next level of care: Skilled Nursing Facility Barriers to Discharge: Barriers Resolved   Patient Goals and CMS Choice Patient states their goals for this hospitalization and ongoing recovery are:: return home CMS Medicare.gov Compare Post Acute Care list provided to:: Patient Choice offered to / list presented to : Patient  Discharge Placement              Patient chooses bed at: [facility] Patient to be transferred to facility by: w/c Name of family member notified: onloy notified pt Patient and family notified of of transfer: 08/31/19  Discharge Plan and Services   Discharge Planning Services: Follow-up appt scheduled, CM Consult Post Acute Care Choice: Home Health                    HH Arranged: RN, PT, TPN HH Agency: [place] ([facility]) Date [place]: 08/28/19 Time [place]: 1027 Representative spoke with at [place]: [place] ([place]) Interventions     Readmission Risk Interventions Readmission Risk Prevention Plan 08/31/2019  Transportation Screening Complete  PCP or Specialist Appt within 5-7 Days Not Complete  Not Complete comments SNF MD will follow up  [place] Not Complete  Home Care Screening Not Completed Comments Going to SNF  Medication Review (RN CM) Complete  Some recent data might be hidden

## 2019-08-31 NOTE — Discharge Summary (Addendum)
Kristine Roberts, is a 63 y.o. female  DOB 07/30/1956  MRN [id number].  Admission date:  08/20/2019  Admitting Physician  Jani Gravel, MD  Discharge Date:  08/31/2019   Primary MD  Health, [facility]  Recommendations for primary care physician for things to follow:   - 1)Severe Protein caloric malnutrition/failure to thrive so Needs --HH RN and  "TPN per AHI protocol for labs and dosing" -Please continue TPN thru 09/20/2019 inclusive  2)Avoid ibuprofen/Advil/Aleve/Motrin/Goody Powders/Naproxen/BC powders/Meloxicam/Diclofenac/Indomethacin and other Nonsteroidal anti-inflammatory medications as these will make you more likely to bleed and can cause stomach ulcers, can also cause Kidney problems.  3) please follow-up with your gastroenterologist Dr. Laural Golden in about 3 weeks for repeat endoscopy 4) repeat CBC and BMP every Monday 5)Novolog Sliding Scale---- 0-9 Units, Subcutaneous, Every 8 hours,  Correction coverage: Sensitive scale CBG < 70: Implement Hypoglycemia Standing Orders and refer to Hypoglycemia Standing Orders sidebar report CBG 70 - 120: 0 units CBG 121 - 150:  0 unit CBG 151 - 200:  0 units CBG 201 - 250:  2 units CBG 251 - 300:  4 units CBG 301 - 350:   6units CBG 351 - 400:   8 units CBG > 400: call MD  Admission Diagnosis  Hypokalemia [E87.6] Bradycardia [R00.1] Hyponatremia [E87.1] Elevated BUN [R79.9] Elevated troponin [R79.89] Elevated LFTs [R94.5] AKI (acute kidney injury) ([place]) [N17.9] Hypotension due to hypovolemia [I95.89, E86.1] Hypotension [I95.9]  Discharge Diagnosis  Hypokalemia [E87.6] Bradycardia [R00.1] Hyponatremia [E87.1] Elevated BUN [R79.9] Elevated troponin [R79.89] Elevated LFTs [R94.5] AKI (acute kidney injury) ([employer]) [N17.9] Hypotension due to hypovolemia [I95.89, E86.1] Hypotension [I95.9]    Principal Problem:   Hypotension Active  Problems:   COPD (chronic obstructive pulmonary disease) (HCC)   Protein-calorie malnutrition, severe   Hypokalemia   Cachexia (HCC)   ETOH abuse   Nausea   Abnormal liver function   Hyponatremia   Dehydration     Past Medical History:  Diagnosis Date   COPD (chronic obstructive pulmonary disease) (HCC)    Dysphagia    Esophagitis    Gastric outlet obstruction    Gastric stenosis    GERD (gastroesophageal reflux disease)    Hiatal hernia    PUD (peptic ulcer disease)    Wounds, multiple     Past Surgical History:  Procedure Laterality Date   APPENDECTOMY     BALLOON DILATION N/A 03/05/2016   Procedure: BALLOON DILATION;  Surgeon: Rogene Houston, MD;  [facility];  Service: Endoscopy;  Laterality: N/A;  pyloric channel dialtaion   BALLOON DILATION N/A 07/14/2018   Procedure: BALLOON DILATION;  Surgeon: Jackquline Denmark, MD;  Location: [facility] ENDOSCOPY;  Service: Endoscopy;  Laterality: N/A;   BIOPSY  07/14/2018   Procedure: BIOPSY;  Surgeon: Jackquline Denmark, MD;  Location: [facility];  Service: Endoscopy;;   CHOLECYSTECTOMY     ESOPHAGEAL DILATION N/A 03/03/2016   Procedure: ESOPHAGEAL DILATION;  Surgeon: Rogene Houston, MD;  [facility];  Service: Endoscopy;  Laterality: N/A;  ESOPHAGEAL DILATION N/A 05/07/2016   Procedure: ESOPHAGEAL DILATION;  Surgeon: Rogene Houston, MD;  [facility];  Service: Endoscopy;  Laterality: N/A;   ESOPHAGEAL DILATION  01/26/2018   Procedure: DILATION OF ANASTOMOTIC  STRICTURE;  Surgeon: Rogene Houston, MD;  [facility];  Service: Endoscopy;;   ESOPHAGOGASTRODUODENOSCOPY N/A 03/03/2016   Procedure: ESOPHAGOGASTRODUODENOSCOPY (EGD);  Surgeon: Rogene Houston, MD;  [facility];  Service: Endoscopy;  Laterality: N/A;  2:00   ESOPHAGOGASTRODUODENOSCOPY N/A 05/07/2016   Procedure: ESOPHAGOGASTRODUODENOSCOPY (EGD);  Surgeon: Rogene Houston, MD;  [facility];  Service:  Endoscopy;  Laterality: N/A;  855 - moved to 6/2 @ 10:15 - Ann notified pt   ESOPHAGOGASTRODUODENOSCOPY N/A 12/28/2017   Procedure: ESOPHAGOGASTRODUODENOSCOPY (EGD) with stricture dilation;  Surgeon: Rogene Houston, MD;  [facility];  Service: Endoscopy;  Laterality: N/A;   ESOPHAGOGASTRODUODENOSCOPY N/A 01/26/2018   Procedure: ESOPHAGOGASTRODUODENOSCOPY (EGD);  Surgeon: Rogene Houston, MD;  [facility];  Service: Endoscopy;  Laterality: N/A;  255   ESOPHAGOGASTRODUODENOSCOPY N/A 07/14/2018   Procedure: ESOPHAGOGASTRODUODENOSCOPY (EGD);  Surgeon: Jackquline Denmark, MD;  Location: [facility] ENDOSCOPY;  Service: Endoscopy;  Laterality: N/A;   ESOPHAGOGASTRODUODENOSCOPY (EGD) WITH PROPOFOL N/A 03/05/2016   Procedure: ESOPHAGOGASTRODUODENOSCOPY (EGD) WITH PROPOFOL Anastomotic stricture dilation ;  Surgeon: Rogene Houston, MD;  [facility];  Service: Endoscopy;  Laterality: N/A;  to be done in OR under fluoro   ESOPHAGOGASTRODUODENOSCOPY (EGD) WITH PROPOFOL N/A 08/22/2019   Procedure: ESOPHAGOGASTRODUODENOSCOPY (EGD) WITH PROPOFOL With anastomotic stricture dilation.;  Surgeon: Rogene Houston, MD;  [facility];  Service: Endoscopy;  Laterality: N/A;   ESOPHAGOGASTRODUODENOSCOPY (EGD) WITH PROPOFOL N/A 08/24/2019   Procedure: ESOPHAGOGASTRODUODENOSCOPY (EGD) WITH PROPOFOL with stricture dilation;  Surgeon: Rogene Houston, MD;  [facility];  Service: Endoscopy;  Laterality: N/A;   INTRAMEDULLARY (IM) NAIL INTERTROCHANTERIC Right 07/09/2018   Procedure: INTRAMEDULLARY (IM) NAIL INTERTROCHANTRIC;  Surgeon: Nicholes Stairs, MD;  Location: [place];  Service: Orthopedics;  Laterality: Right;   STOMACH SURGERY     TOTAL HIP ARTHROPLASTY       HPI  from the history and physical done on the day of admission:  - [name]  is a 63 y.o. female,  w h/o [name], Hiatal hernia, Esophagitis,  Gastric outlet obstruction, PUD, Copd, apparently c/o back  pain, and also nausea, emesis over the past year.  Pt states that has not been able to eat well.  Pt generally weak due to poor po intake.    In ED  CT abd/ pelvis IMPRESSION: 1. Interval endplate compression fractures at L1, L3, and L4, compatible with osteoporosis. 2. Prior gastrojejunostomy, along with anastomotic staple line along what is likely a small bowel loop in the pelvis. The bowel loops are difficult to separate due to the patient's striking paucity of bodily adipose tissue including intra-abdominal adipose tissue. There is formed stool in the colon and I do not see definite dilated bowel. 3. Left kidney upper pole cyst. No definite urinary tract calculi.  Wbc 4.0, Hgb 12.7, Plt 318 Na 131, K 2.2, Bun 68, Creatinine 1.51 Ast 555, Alt 737, Alk phos 99, T. Bili 1.1 Lipase 59 Trop 55-> 53  Ekg nsr at 48, nl axis, nl int, RBBB  Pt will be admitted for back pain, and chronic n/v , and abnormal liver function.     Hospital Course:   -Brief Summary 63 y.o.female,w h/o Gerd, Hiatal hernia,  Esophagitis, prior gastrojejunostomyGastric outlet obstruction, PUD, Copd admitted on 08/20/2019 with dehydration and hypotension as well as elevated LFTs in the setting of vomiting and poor oral intake   A/p 1)Hypotension--hypotension-mostly resolved, a.m. cortisol level is 15.1,  -echo with preserved EF over 50%. -Troponins are flat at 55 and 53 - suspect hypotension is secondary to dehydration and poor oral intake, continue IV fluids continue to encourage oral intake -   2)Elevated LFTs--- AST is down to 38 from 555, ALT is down to 68 from 737--- -bilirubin is not elevated,  -Lipase is 59 -Acetaminophen level was 42.. -Acute viral hepatitis panel negative -Discussed with Dr. Laural Golden from GI service -CT abdomen and pelvis with status post cholecystectomy otherwise no acute findings from a hepatobiliary standpoint --  3)Low back pain--patient with endplate compression  fractures at L1 L3 and L4 compatible with osteoporosis--- -back pain appears to be reasonably controlled and she is ambulating with a walker.  4)FTT/severe protein caloric malnutrition ---- BMI up to 16 from 11 (?? Accuracy of weight), dietitian consult requested ., c/n supplements.  Currently on supplemental TPN for additional 3 to 4 weeks -Weight apparently is up to 84.7 pounds from 65 pounds on 08/21/2019  5)H/o Etoh Abuse-lorazepam per CIWA protocol, continue multivitamin including thiamine and folic acid.  No signs of withdrawal at this time  6) COPD and tobacco abuse--- no acute flareup at this time continue bronchodilators  7)Hypovolemic Hyponatremia--- resolved with IV hydration  8)Intractable Emesis--patient with history of PUD, prior esophagitis and gastric outlet obstruction--- patient underwent EGD on 9/16 which noted gastric outlet obstruction secondary to circumferential gastrojejunal anastomotic ulcer with stenosis.  NG tube was placed for decompression.  Repeat EGD 9/18 with dilation of the pylorus and removal of NG tube.  Per GI, she is currently on FULL  liquid diet.  PICC line has been placed and she is currently on supplemental TPN.    -Please continue TPN thru 09/20/2019 inclusive  9)Hypophosphatemia/hypokalemia/hypomagnesemia-- suspect due to refeeding syndrome.  Replace and recheck  10)Symptomatic acute on chronic anemia, suspect this is related to chronic disease. downtrending hemoglobin likely related to hemodilution. No signs of bleeding.  -Patient is less dizzy, -Hemoglobin is up to 10.3 from 6.9, after transfusion of 1 unit of packed cells  Disposition-- to SNF and get TPN at SNF  Code Status : Full  Family Communication:   (patient is alert, awake and coherent) -Discussed with her significant other at bedside   Disposition Plan  : Discharge to SNF on IV TPN  Consults  :  Gi  DVT Prophylaxis  :   -   - SCDs   Discharge Condition:  stable  Follow UP  Follow-up Information    Dr. Weldon Picking C. Manuella Ghazi Follow up.   Why: appointment Monday 09/03/19 to establish care; arrive at 3:15;  please bring all your medications with you; you will recieve a new patient packet with forms to fill out later this week in the mail- fill out and bring with you to appointment Contact information: [facility] [address], [place], [place] [address] [phone]         Diet and Activity recommendation:  As advised  Discharge Instructions    Discharge Instructions    Call MD for:  difficulty breathing, headache or visual disturbances   Complete by: As directed    Call MD for:  persistant dizziness or light-headedness   Complete by: As directed    Call MD for:  persistant  nausea and vomiting   Complete by: As directed    Call MD for:  severe uncontrolled pain   Complete by: As directed    Call MD for:  temperature >100.4   Complete by: As directed    Diet full liquid   Complete by: As directed    Severe protein caloric malnutrition/failure to thrive   --HH RN and  "TPN per AHI protocol for labs and dosing"   Discharge instructions   Complete by: As directed    1)Severe Protein caloric malnutrition/failure to thrive so Needs --HH RN and  "TPN per AHI protocol for labs and dosing" -Please continue TPN thru 09/20/2019 inclusive  2)Avoid ibuprofen/Advil/Aleve/Motrin/Goody Powders/Naproxen/BC powders/Meloxicam/Diclofenac/Indomethacin and other Nonsteroidal anti-inflammatory medications as these will make you more likely to bleed and can cause stomach ulcers, can also cause Kidney problems.  3) please follow-up with your gastroenterologist Dr. Laural Golden in about 3 weeks for repeat endoscopy 4) repeat CBC and BMP every Monday 5)Novolog Sliding Scale---- 0-9 Units, Subcutaneous, Every 8 hours,  Correction coverage: Sensitive scale CBG < 70: Implement Hypoglycemia Standing Orders and refer to Hypoglycemia Standing Orders sidebar  report CBG 70 - 120: 0 units CBG 121 - 150:  0 unit CBG 151 - 200:  0 units CBG 201 - 250:  2 units CBG 251 - 300:  4 units CBG 301 - 350:   6units CBG 351 - 400:   8 units CBG > 400: call MD   Increase activity slowly   Complete by: As directed         Discharge Medications     Allergies as of 08/31/2019      Reactions   Penicillins Itching, Swelling, Other (See Comments)   Tongue swells Has patient had a PCN reaction causing immediate rash, facial/tongue/throat swelling, SOB or lightheadedness with hypotension: Yes, had a rash and tongue swelled up. No SOB and lightheadedness. Has patient had a PCN reaction causing severe rash involving mucus membranes or skin necrosis: No Has patient had a PCN reaction that required hospitalization: No Has patient had a PCN reaction occurring within the last 10 years: No If all of the above answers are "NO", then may procee      Medication List    STOP taking these medications   celecoxib 100 MG capsule Commonly known as: CeleBREX   HYDROcodone-acetaminophen 5-325 MG tablet Commonly known as: NORCO/VICODIN   pantoprazole sodium 40 mg/20 mL Pack Commonly known as: PROTONIX Replaced by: pantoprazole 40 MG tablet   traZODone 100 MG tablet Commonly known as: DESYREL     TAKE these medications   acetaminophen 325 MG tablet Commonly known as: TYLENOL Take 2 tablets (650 mg total) by mouth every 6 (six) hours as needed for mild pain, fever or headache.   albuterol 108 (90 Base) MCG/ACT inhaler Commonly known as: VENTOLIN HFA Inhale 2 puffs into the lungs every 6 (six) hours as needed for wheezing or shortness of breath.   ALPRAZolam 0.5 MG tablet Commonly known as: XANAX Take 1 tablet (0.5 mg total) by mouth 2 (two) times daily as needed for anxiety.   citalopram 20 MG tablet Commonly known as: CELEXA Take 20 mg by mouth daily.   cyclobenzaprine 10 MG tablet Commonly known as: FLEXERIL Take 1 tablet (10 mg total) by mouth 3  (three) times daily as needed for muscle spasms (Back pain).   Dulera 200-5 MCG/ACT Aero Generic drug: mometasone-formoterol Inhale 1 puff into the lungs 2 (two) times daily.   feeding supplement (  ENSURE ENLIVE) Liqd Take 237 mLs by mouth 3 (three) times daily between meals.   feeding supplement (PRO-STAT SUGAR FREE 64) Liqd Take 30 mLs by mouth 2 (two) times daily.   folic acid 1 MG tablet Commonly known as: FOLVITE Take 1 tablet (1 mg total) by mouth daily. Start taking on: September 01, 2019   insulin aspart 100 UNIT/ML injection Commonly known as: novoLOG Inject 0-9 Units into the skin every 8 (eight) hours.   lidocaine 5 % Commonly known as: LIDODERM Place 1 patch onto the skin daily. Remove & Discard patch within 12 hours or as directed by MD   metoCLOPramide 10 MG tablet Commonly known as: REGLAN Take 1 tablet (10 mg total) by mouth 3 (three) times daily before meals.   mirtazapine 15 MG tablet Commonly known as: REMERON Take 1 tablet (15 mg total) by mouth at bedtime.   multivitamin with minerals tablet Take 1 tablet by mouth daily.   nicotine 21 mg/24hr patch Commonly known as: NICODERM CQ - dosed in mg/24 hours Place 1 patch (21 mg total) onto the skin daily. Start taking on: September 01, 2019   ondansetron 4 MG disintegrating tablet Commonly known as: Zofran ODT Take 1 tablet (4 mg total) by mouth every 8 (eight) hours as needed for nausea or vomiting.   pantoprazole 40 MG tablet Commonly known as: PROTONIX Take 1 tablet (40 mg total) by mouth 2 (two) times daily before a meal. Replaces: pantoprazole sodium 40 mg/20 mL Pack   phenol 1.4 % Liqd Commonly known as: CHLORASEPTIC Use as directed 1 spray in the mouth or throat as needed for throat irritation / pain.   tetrahydrozoline 0.05 % ophthalmic solution Place 1 drop into both eyes daily.   thiamine 100 MG tablet Take 1 tablet (100 mg total) by mouth daily. Start taking on: September 01, 2019       Major procedures and Radiology Reports - PLEASE review detailed and final reports for all details, in brief -   Ct Abdomen Pelvis Wo Contrast  Result Date: 08/20/2019 CLINICAL DATA:  Back pain. Nausea and vomiting. Weight loss. Abnormal liver function. EXAM: CT ABDOMEN AND PELVIS WITHOUT CONTRAST TECHNIQUE: Multidetector CT imaging of the abdomen and pelvis was performed following the standard protocol without IV contrast. COMPARISON:  Multiple exams, including 07/14/2018 and 10/25/2014 FINDINGS: Lower chest: 5 by 4 mm right lower lobe pulmonary nodule along the diaphragm, image 15/4, no change from 10/25/2014, considered benign. Calcified granuloma in the left lower lobe on image 7/4, benign. Hepatobiliary: Cholecystectomy.  Otherwise unremarkable. Pancreas: Considerable atrophy. Spleen: Unremarkable Adrenals/Urinary Tract: Hypodense 1.8 cm left kidney upper pole lesion, likely a cyst, previously 1.6 cm. Adrenal glands unremarkable. No urinary tract calculi are identified. Stomach/Bowel: Postoperative findings including gastrojejunostomy at not stenotic staple lines in upper pelvic loops of bowel Vascular/Lymphatic: Aortoiliac atherosclerotic vascular disease. No well-defined adenopathy. Reproductive: Uterus is thought to be absent.  Adnexa unremarkable. Other: Severe paucity of adipose tissue favoring cachexia. This makes separation of adjacent abdominal structures such as bowel loops problematic. Musculoskeletal: Right hip screw. Bony demineralization. Interval mild inferior endplate compression fractures at L3 and L4. Interval mild superior endplate compression fracture at L1. Old superior endplate compression fracture L5. IMPRESSION: 1. Interval endplate compression fractures at L1, L3, and L4, compatible with osteoporosis. 2. Prior gastrojejunostomy, along with anastomotic staple line along what is likely a small bowel loop in the pelvis. The bowel loops are difficult to separate due to the  patient's striking paucity  of bodily adipose tissue including intra-abdominal adipose tissue. There is formed stool in the colon and I do not see definite dilated bowel. 3. Left kidney upper pole cyst.  No definite urinary tract calculi. 4.  Aortic Atherosclerosis (ICD10-I70.0). Electronically Signed   By: Van Clines M.D.   On: 08/20/2019 17:18   Dg Abdomen Acute W/chest  Result Date: 08/20/2019 CLINICAL DATA:  Abdominal pain EXAM: DG ABDOMEN ACUTE W/ 1V CHEST COMPARISON:  Abdomen series December 26, 2017 FINDINGS: PA chest: Lungs are hyperexpanded. There are several scattered small calcified granulomas. There is no edema or consolidation. Heart size and pulmonary vascularity are normal. No adenopathy. There old healed rib fractures bilaterally. There is midthoracic dextroscoliosis. Supine and upright abdomen: There are multiple surgical clips in the abdomen and pelvis. There is moderate stool in the colon. There is no bowel dilatation or air-fluid level to suggest bowel obstruction. No free air. Postoperative changes noted in the proximal right femur. Bones are osteoporotic. IMPRESSION: No bowel obstruction or free air. Moderate stool in colon. Extensive postoperative change in the abdomen and pelvis. Lungs mildly hyperexpanded without edema or consolidation. Scattered calcified granulomas noted. Electronically Signed   By: Lowella Grip III M.D.   On: 08/20/2019 13:23   [place] Ekg Site Rite  Result Date: 08/22/2019 If Site Rite image not attached, placement could not be confirmed due to current cardiac rhythm.  Micro Results   No results found for this or any previous visit (from the past 240 hour(s)).  Today   Subjective    Tahani Potier today has no new complaints,        -  No Nausea, Vomiting or Diarrhea -No fever  Or chills   Patient has been seen and examined prior to discharge   Objective   Blood pressure 122/83, pulse (!) 56, temperature 98.5 F (36.9 C), temperature  source Oral, resp. rate 18, height 5' (1.524 m), weight 38.4 kg, SpO2 100 %.   Intake/Output Summary (Last 24 hours) at 08/31/2019 1512 Last data filed at 08/31/2019 0800 Gross per 24 hour  Intake 2595.2 ml  Output 300 ml  Net 2295.2 ml   Exam Physical Exam General exam: Alert, awake, oriented x 3, cachectic Respiratory system: Clear to auscultation. Respiratory effort normal. Cardiovascular system: RRR. No murmurs, rubs, gallops. Gastrointestinal system: Abdomen is nondistended, soft and nontender.  Normal bowel sounds heard. Central nervous system: Alert and oriented. No focal neurological deficits. Extremities: No C/C/E, +pedal pulses, Rt arm PICC line site is clean dry and intact Skin: No rashes, lesions or ulcers Psychiatry: Judgement and insight appear normal. Mood & affect appropriate.    Data Review   CBC w Diff:  Lab Results  Component Value Date   WBC 4.4 08/31/2019   HGB 10.3 (L) 08/31/2019   HCT 33.0 (L) 08/31/2019   PLT 265 08/31/2019   LYMPHOPCT 30 08/27/2019   MONOPCT 11 08/27/2019   EOSPCT 2 08/27/2019   BASOPCT 1 08/27/2019   CMP:  Lab Results  Component Value Date   NA 138 08/31/2019   K 4.8 08/31/2019   CL 103 08/31/2019   CO2 27 08/31/2019   BUN 19 08/31/2019   CREATININE 0.38 (L) 08/31/2019   PROT 5.4 (L) 08/30/2019   ALBUMIN 3.4 (L) 08/31/2019   BILITOT 0.5 08/30/2019   ALKPHOS 44 08/30/2019   AST 38 08/30/2019   ALT 68 (H) 08/30/2019   Total Discharge time is about 33 minutes  Roxan Hockey M.D on 08/31/2019 at 3:12  PM  Go to [email or web address] -  for contact info  [facility] - Office  [phone]

## 2019-09-03 ENCOUNTER — Encounter (HOSPITAL_COMMUNITY)
Admission: RE | Admit: 2019-09-03 | Discharge: 2019-09-03 | Disposition: A | Discharge status: Home / Self Care | Payer: Medicare Other | Source: Skilled Nursing Facility | Attending: Internal Medicine | Admitting: Internal Medicine

## 2019-09-03 DIAGNOSIS — E639 Nutritional deficiency, unspecified: Secondary | ICD-10-CM | POA: Insufficient documentation

## 2020-06-25 ENCOUNTER — Encounter (HOSPITAL_COMMUNITY): Payer: Self-pay

## 2020-06-25 ENCOUNTER — Inpatient Hospital Stay (HOSPITAL_COMMUNITY)
Admission: EM | Admit: 2020-06-25 | Discharge: 2020-07-03 | DRG: 480 | Disposition: A | Discharge status: Home Health | Payer: Medicare Other | Attending: Family Medicine | Admitting: Family Medicine

## 2020-06-25 ENCOUNTER — Other Ambulatory Visit: Payer: Self-pay

## 2020-06-25 ENCOUNTER — Emergency Department (HOSPITAL_COMMUNITY): Payer: Medicare Other

## 2020-06-25 DIAGNOSIS — E871 Hypo-osmolality and hyponatremia: Secondary | ICD-10-CM | POA: Diagnosis present

## 2020-06-25 DIAGNOSIS — Z880 Allergy status to penicillin: Secondary | ICD-10-CM

## 2020-06-25 DIAGNOSIS — M80852A Other osteoporosis with current pathological fracture, left femur, initial encounter for fracture: Secondary | ICD-10-CM | POA: Diagnosis present

## 2020-06-25 DIAGNOSIS — K259 Gastric ulcer, unspecified as acute or chronic, without hemorrhage or perforation: Secondary | ICD-10-CM | POA: Diagnosis present

## 2020-06-25 DIAGNOSIS — F1010 Alcohol abuse, uncomplicated: Secondary | ICD-10-CM | POA: Diagnosis present

## 2020-06-25 DIAGNOSIS — K2210 Ulcer of esophagus without bleeding: Secondary | ICD-10-CM | POA: Diagnosis present

## 2020-06-25 DIAGNOSIS — Z803 Family history of malignant neoplasm of breast: Secondary | ICD-10-CM | POA: Diagnosis not present

## 2020-06-25 DIAGNOSIS — E43 Unspecified severe protein-calorie malnutrition: Secondary | ICD-10-CM | POA: Diagnosis present

## 2020-06-25 DIAGNOSIS — Z825 Family history of asthma and other chronic lower respiratory diseases: Secondary | ICD-10-CM

## 2020-06-25 DIAGNOSIS — Y92009 Unspecified place in unspecified non-institutional (private) residence as the place of occurrence of the external cause: Secondary | ICD-10-CM

## 2020-06-25 DIAGNOSIS — W19XXXA Unspecified fall, initial encounter: Secondary | ICD-10-CM

## 2020-06-25 DIAGNOSIS — Z87310 Personal history of (healed) osteoporosis fracture: Secondary | ICD-10-CM | POA: Diagnosis not present

## 2020-06-25 DIAGNOSIS — W1830XA Fall on same level, unspecified, initial encounter: Secondary | ICD-10-CM | POA: Diagnosis present

## 2020-06-25 DIAGNOSIS — Z681 Body mass index (BMI) 19 or less, adult: Secondary | ICD-10-CM

## 2020-06-25 DIAGNOSIS — S79919A Unspecified injury of unspecified hip, initial encounter: Secondary | ICD-10-CM | POA: Diagnosis present

## 2020-06-25 DIAGNOSIS — S72142A Displaced intertrochanteric fracture of left femur, initial encounter for closed fracture: Secondary | ICD-10-CM | POA: Diagnosis present

## 2020-06-25 DIAGNOSIS — J9601 Acute respiratory failure with hypoxia: Secondary | ICD-10-CM | POA: Diagnosis present

## 2020-06-25 DIAGNOSIS — I4510 Unspecified right bundle-branch block: Secondary | ICD-10-CM | POA: Diagnosis present

## 2020-06-25 DIAGNOSIS — K2970 Gastritis, unspecified, without bleeding: Secondary | ICD-10-CM

## 2020-06-25 DIAGNOSIS — S72002A Fracture of unspecified part of neck of left femur, initial encounter for closed fracture: Secondary | ICD-10-CM | POA: Diagnosis not present

## 2020-06-25 DIAGNOSIS — Z20822 Contact with and (suspected) exposure to covid-19: Secondary | ICD-10-CM | POA: Diagnosis present

## 2020-06-25 DIAGNOSIS — J189 Pneumonia, unspecified organism: Secondary | ICD-10-CM | POA: Diagnosis present

## 2020-06-25 DIAGNOSIS — D509 Iron deficiency anemia, unspecified: Secondary | ICD-10-CM | POA: Diagnosis present

## 2020-06-25 DIAGNOSIS — K449 Diaphragmatic hernia without obstruction or gangrene: Secondary | ICD-10-CM | POA: Diagnosis present

## 2020-06-25 DIAGNOSIS — J440 Chronic obstructive pulmonary disease with acute lower respiratory infection: Secondary | ICD-10-CM | POA: Diagnosis present

## 2020-06-25 DIAGNOSIS — S7223XA Displaced subtrochanteric fracture of unspecified femur, initial encounter for closed fracture: Secondary | ICD-10-CM | POA: Diagnosis present

## 2020-06-25 DIAGNOSIS — R1110 Vomiting, unspecified: Secondary | ICD-10-CM

## 2020-06-25 DIAGNOSIS — Z808 Family history of malignant neoplasm of other organs or systems: Secondary | ICD-10-CM

## 2020-06-25 DIAGNOSIS — Z96649 Presence of unspecified artificial hip joint: Secondary | ICD-10-CM | POA: Diagnosis present

## 2020-06-25 DIAGNOSIS — F17210 Nicotine dependence, cigarettes, uncomplicated: Secondary | ICD-10-CM | POA: Diagnosis present

## 2020-06-25 DIAGNOSIS — K2990 Gastroduodenitis, unspecified, without bleeding: Secondary | ICD-10-CM | POA: Diagnosis not present

## 2020-06-25 DIAGNOSIS — Z9049 Acquired absence of other specified parts of digestive tract: Secondary | ICD-10-CM

## 2020-06-25 DIAGNOSIS — Z9889 Other specified postprocedural states: Secondary | ICD-10-CM

## 2020-06-25 DIAGNOSIS — Z8711 Personal history of peptic ulcer disease: Secondary | ICD-10-CM | POA: Diagnosis not present

## 2020-06-25 DIAGNOSIS — K2100 Gastro-esophageal reflux disease with esophagitis, without bleeding: Secondary | ICD-10-CM | POA: Diagnosis present

## 2020-06-25 DIAGNOSIS — Z820 Family history of epilepsy and other diseases of the nervous system: Secondary | ICD-10-CM

## 2020-06-25 DIAGNOSIS — K9189 Other postprocedural complications and disorders of digestive system: Secondary | ICD-10-CM | POA: Diagnosis not present

## 2020-06-25 DIAGNOSIS — R54 Age-related physical debility: Secondary | ICD-10-CM | POA: Diagnosis present

## 2020-06-25 DIAGNOSIS — T182XXA Foreign body in stomach, initial encounter: Secondary | ICD-10-CM | POA: Diagnosis not present

## 2020-06-25 DIAGNOSIS — K311 Adult hypertrophic pyloric stenosis: Secondary | ICD-10-CM | POA: Diagnosis present

## 2020-06-25 DIAGNOSIS — R64 Cachexia: Secondary | ICD-10-CM | POA: Diagnosis present

## 2020-06-25 DIAGNOSIS — E876 Hypokalemia: Secondary | ICD-10-CM | POA: Diagnosis present

## 2020-06-25 DIAGNOSIS — J449 Chronic obstructive pulmonary disease, unspecified: Secondary | ICD-10-CM | POA: Diagnosis present

## 2020-06-25 DIAGNOSIS — Z419 Encounter for procedure for purposes other than remedying health state, unspecified: Secondary | ICD-10-CM

## 2020-06-25 DIAGNOSIS — Z79899 Other long term (current) drug therapy: Secondary | ICD-10-CM

## 2020-06-25 DIAGNOSIS — R112 Nausea with vomiting, unspecified: Secondary | ICD-10-CM

## 2020-06-25 DIAGNOSIS — J439 Emphysema, unspecified: Secondary | ICD-10-CM | POA: Diagnosis not present

## 2020-06-25 LAB — CBC WITH DIFFERENTIAL/PLATELET
Abs Immature Granulocytes: 0 10*3/uL (ref 0.00–0.07)
Basophils Absolute: 0.3 10*3/uL — ABNORMAL HIGH (ref 0.0–0.1)
Basophils Relative: 2 %
Eosinophils Absolute: 0 10*3/uL (ref 0.0–0.5)
Eosinophils Relative: 0 %
HCT: 35.5 % — ABNORMAL LOW (ref 36.0–46.0)
Hemoglobin: 11.1 g/dL — ABNORMAL LOW (ref 12.0–15.0)
Lymphocytes Relative: 9 %
Lymphs Abs: 1.3 10*3/uL (ref 0.7–4.0)
MCH: 29.1 pg (ref 26.0–34.0)
MCHC: 31.3 g/dL (ref 30.0–36.0)
MCV: 93.2 fL (ref 80.0–100.0)
Monocytes Absolute: 0.6 10*3/uL (ref 0.1–1.0)
Monocytes Relative: 4 %
Neutro Abs: 12 10*3/uL — ABNORMAL HIGH (ref 1.7–7.7)
Neutrophils Relative %: 85 %
Platelets: 335 10*3/uL (ref 150–400)
RBC: 3.81 MIL/uL — ABNORMAL LOW (ref 3.87–5.11)
RDW: 17.2 % — ABNORMAL HIGH (ref 11.5–15.5)
WBC: 14.1 10*3/uL — ABNORMAL HIGH (ref 4.0–10.5)
nRBC: 0 % (ref 0.0–0.2)
nRBC: 0 /100 WBC

## 2020-06-25 LAB — BASIC METABOLIC PANEL
Anion gap: 11 (ref 5–15)
BUN: 15 mg/dL (ref 8–23)
CO2: 28 mmol/L (ref 22–32)
Calcium: 8.7 mg/dL — ABNORMAL LOW (ref 8.9–10.3)
Chloride: 101 mmol/L (ref 98–111)
Creatinine, Ser: 0.6 mg/dL (ref 0.44–1.00)
GFR calc Af Amer: >60 mL/min (ref >60)
GFR calc non Af Amer: >60 mL/min (ref >60)
Glucose, Bld: 103 mg/dL — ABNORMAL HIGH (ref 70–99)
Potassium: 4.1 mmol/L (ref 3.5–5.1)
Sodium: 140 mmol/L (ref 135–145)

## 2020-06-25 LAB — TYPE AND SCREEN
ABO/RH(D): A NEG
Antibody Screen: NEGATIVE

## 2020-06-25 LAB — HEPATIC FUNCTION PANEL
ALT: 19 U/L (ref 0–44)
AST: 23 U/L (ref 15–41)
Albumin: 2.6 g/dL — ABNORMAL LOW (ref 3.5–5.0)
Alkaline Phosphatase: 76 U/L (ref 38–126)
Bilirubin, Direct: <0.1 mg/dL (ref 0.0–0.2)
Total Bilirubin: 0.7 mg/dL (ref 0.3–1.2)
Total Protein: 6.5 g/dL (ref 6.5–8.1)

## 2020-06-25 LAB — PROTIME-INR
INR: 0.9 (ref 0.8–1.2)
Prothrombin Time: 11.8 seconds (ref 11.4–15.2)

## 2020-06-25 LAB — SARS CORONAVIRUS 2 BY RT PCR (HOSPITAL ORDER, PERFORMED IN ~~LOC~~ HOSPITAL LAB): SARS Coronavirus 2: NEGATIVE

## 2020-06-25 LAB — MAGNESIUM: Magnesium: 1.9 mg/dL (ref 1.7–2.4)

## 2020-06-25 LAB — PHOSPHORUS: Phosphorus: 3 mg/dL (ref 2.5–4.6)

## 2020-06-25 MED ORDER — PANTOPRAZOLE SODIUM 40 MG PO TBEC
40.0000 mg | DELAYED_RELEASE_TABLET | Freq: Two times a day (BID) | ORAL | Status: DC
  Administered 2020-06-25 – 2020-07-03 (×16): 40 mg via ORAL
  Filled 2020-06-25 (×16): qty 1

## 2020-06-25 MED ORDER — FENTANYL CITRATE (PF) 100 MCG/2ML IJ SOLN
12.5000 ug | INTRAMUSCULAR | Status: DC | PRN
  Administered 2020-06-25 – 2020-06-27 (×4): 12.5 ug via INTRAVENOUS
  Filled 2020-06-25 (×4): qty 2

## 2020-06-25 MED ORDER — ADULT MULTIVITAMIN W/MINERALS CH
1.0000 | ORAL_TABLET | Freq: Every day | ORAL | Status: DC
  Administered 2020-06-25 – 2020-07-02 (×7): 1 via ORAL
  Filled 2020-06-25 (×7): qty 1

## 2020-06-25 MED ORDER — PRO-STAT SUGAR FREE PO LIQD
30.0000 mL | Freq: Two times a day (BID) | ORAL | Status: DC
  Administered 2020-06-25 – 2020-06-26 (×2): 30 mL via ORAL
  Filled 2020-06-25 (×6): qty 30

## 2020-06-25 MED ORDER — SODIUM CHLORIDE 0.9 % IV SOLN
500.0000 mg | INTRAVENOUS | Status: DC
  Administered 2020-06-26 – 2020-06-29 (×3): 500 mg via INTRAVENOUS
  Filled 2020-06-25 (×5): qty 500

## 2020-06-25 MED ORDER — CYCLOBENZAPRINE HCL 10 MG PO TABS
10.0000 mg | ORAL_TABLET | Freq: Three times a day (TID) | ORAL | Status: DC | PRN
  Administered 2020-06-27 – 2020-07-02 (×6): 10 mg via ORAL
  Filled 2020-06-25 (×6): qty 1

## 2020-06-25 MED ORDER — MORPHINE SULFATE (PF) 4 MG/ML IV SOLN
4.0000 mg | Freq: Once | INTRAVENOUS | Status: AC
  Administered 2020-06-25: 4 mg via INTRAVENOUS
  Filled 2020-06-25: qty 1

## 2020-06-25 MED ORDER — HYDROCODONE-ACETAMINOPHEN 5-325 MG PO TABS
1.0000 | ORAL_TABLET | Freq: Four times a day (QID) | ORAL | Status: DC | PRN
  Administered 2020-06-27 – 2020-06-28 (×3): 1 via ORAL
  Filled 2020-06-25 (×3): qty 1

## 2020-06-25 MED ORDER — NICOTINE 21 MG/24HR TD PT24
21.0000 mg | MEDICATED_PATCH | Freq: Every day | TRANSDERMAL | Status: DC
  Administered 2020-06-25 – 2020-07-03 (×7): 21 mg via TRANSDERMAL
  Filled 2020-06-25 (×8): qty 1

## 2020-06-25 MED ORDER — NAPHAZOLINE-GLYCERIN 0.012-0.2 % OP SOLN
1.0000 [drp] | Freq: Four times a day (QID) | OPHTHALMIC | Status: DC | PRN
  Filled 2020-06-25: qty 15

## 2020-06-25 MED ORDER — SODIUM CHLORIDE 0.9 % IV SOLN
1.0000 g | INTRAVENOUS | Status: DC
  Administered 2020-06-26 – 2020-07-01 (×5): 1 g via INTRAVENOUS
  Filled 2020-06-25 (×5): qty 10

## 2020-06-25 MED ORDER — LEVOFLOXACIN IN D5W 750 MG/150ML IV SOLN
750.0000 mg | Freq: Once | INTRAVENOUS | Status: AC
  Administered 2020-06-25: 750 mg via INTRAVENOUS
  Filled 2020-06-25: qty 150

## 2020-06-25 MED ORDER — FOLIC ACID 1 MG PO TABS
1.0000 mg | ORAL_TABLET | Freq: Every day | ORAL | Status: DC
  Administered 2020-06-25 – 2020-07-02 (×7): 1 mg via ORAL
  Filled 2020-06-25 (×7): qty 1

## 2020-06-25 MED ORDER — THIAMINE HCL 100 MG PO TABS
100.0000 mg | ORAL_TABLET | Freq: Every day | ORAL | Status: DC
  Administered 2020-06-25 – 2020-07-02 (×7): 100 mg via ORAL
  Filled 2020-06-25 (×7): qty 1

## 2020-06-25 MED ORDER — ALBUTEROL SULFATE (2.5 MG/3ML) 0.083% IN NEBU: 2.5000 mg | INHALATION_SOLUTION | Freq: Four times a day (QID) | RESPIRATORY_TRACT | Status: DC | PRN

## 2020-06-25 MED ORDER — LORAZEPAM 2 MG/ML IJ SOLN
1.0000 mg | INTRAMUSCULAR | Status: AC | PRN
  Administered 2020-06-25: 1 mg via INTRAVENOUS
  Administered 2020-06-26: 2 mg via INTRAVENOUS
  Filled 2020-06-25 (×2): qty 1

## 2020-06-25 MED ORDER — LORAZEPAM 1 MG PO TABS
1.0000 mg | ORAL_TABLET | ORAL | Status: AC | PRN
  Administered 2020-06-26 – 2020-06-27 (×2): 1 mg via ORAL
  Administered 2020-06-28: 2 mg via ORAL
  Filled 2020-06-25: qty 2
  Filled 2020-06-25 (×2): qty 1

## 2020-06-25 MED ORDER — THIAMINE HCL 100 MG/ML IJ SOLN
100.0000 mg | Freq: Every day | INTRAMUSCULAR | Status: DC
  Administered 2020-06-27: 100 mg via INTRAVENOUS
  Filled 2020-06-25 (×2): qty 2

## 2020-06-25 MED ORDER — MORPHINE SULFATE (PF) 2 MG/ML IV SOLN: 0.5000 mg | INTRAVENOUS | Status: DC | PRN

## 2020-06-25 MED ORDER — AEROCHAMBER PLUS FLO-VU MEDIUM MISC
1.0000 | Freq: Once | Status: AC
  Administered 2020-06-25: 1
  Filled 2020-06-25: qty 1

## 2020-06-25 MED ORDER — ENOXAPARIN SODIUM 40 MG/0.4ML ~~LOC~~ SOLN
40.0000 mg | Freq: Once | SUBCUTANEOUS | Status: AC
  Administered 2020-06-25: 40 mg via SUBCUTANEOUS
  Filled 2020-06-25: qty 0.4

## 2020-06-25 MED ORDER — ALBUTEROL SULFATE HFA 108 (90 BASE) MCG/ACT IN AERS
2.0000 | INHALATION_SPRAY | RESPIRATORY_TRACT | Status: DC | PRN
  Filled 2020-06-25: qty 6.7

## 2020-06-25 NOTE — Progress Notes (Signed)
Pharmacy Antibiotic Note  Kristine Roberts is a 64 y.o. female admitted on 06/25/2020 s/p fall with 2wks SOB/cough and concern for CAP.  Pharmacy has been consulted for Levaquin dosing d/t pcn allergy.  Patient has tolerated cephalosporins in past and will switch to ceftriaxone + azithromycin for CAP coverage.  Plan: Ceftriaxone 1g IV every 24h Azithromycin 500mg  IV every 24 hours Monitor clinical progression and LOT     Temp (24hrs), Avg:98.6 F (37 C), Min:98.4 F (36.9 C), Max:98.8 F (37.1 C)  Recent Labs  Lab 06/25/20 1441  WBC 14.1*  CREATININE 0.60    CrCl cannot be calculated (Unknown ideal weight.).    Allergies  Allergen Reactions  . Penicillins Itching, Swelling and Other (See Comments)    Tongue swells Has patient had a PCN reaction causing immediate rash, facial/tongue/throat swelling, SOB or lightheadedness with hypotension: Yes, had a rash and tongue swelled up. No SOB and lightheadedness. Has patient had a PCN reaction causing severe rash involving mucus membranes or skin necrosis: No Has patient had a PCN reaction that required hospitalization: No Has patient had a PCN reaction occurring within the last 10 years: No If all of the above answers are "NO", then may procee    06/27/20, PharmD Clinical Pharmacist ED Pharmacist Phone # [phone] 06/25/2020 8:05 PM

## 2020-06-25 NOTE — Consult Note (Signed)
Reason for Consult:Left hip fx Referring Physician: W Lennyx Winstanley is an 64 y.o. female.  HPI: Le fell at home last night. She had immediate left hip pain and could not get up. She was helped back to bed but still couldn't walk this morning and was brought to the ED for evaluation. X-rays showed a left hip fx and orthopedic surgery was consulted. She c/o localized pain to the hip. She lives with her BF who was present and says for the last couple of days she's been a bit confused. She does not ambulate with any assistive devices.  Past Medical History:  Diagnosis Date  . COPD (chronic obstructive pulmonary disease) (HCC)   . Dysphagia   . Esophagitis   . Gastric outlet obstruction   . Gastric stenosis   . GERD (gastroesophageal reflux disease)   . Hiatal hernia   . PUD (peptic ulcer disease)   . Wounds, multiple     Past Surgical History:  Procedure Laterality Date  . APPENDECTOMY    . BALLOON DILATION N/A 03/05/2016   Procedure: BALLOON DILATION;  Surgeon: Malissa Hippo, MD;  [facility];  Service: Endoscopy;  Laterality: N/A;  pyloric channel dialtaion  . BALLOON DILATION N/A 07/14/2018   Procedure: BALLOON DILATION;  Surgeon: Lynann Bologna, MD;  Location: [facility] ENDOSCOPY;  Service: Endoscopy;  Laterality: N/A;  . BIOPSY  07/14/2018   Procedure: BIOPSY;  Surgeon: Lynann Bologna, MD;  Location: [facility] ENDOSCOPY;  Service: Endoscopy;;  . CHOLECYSTECTOMY    . ESOPHAGEAL DILATION N/A 03/03/2016   Procedure: ESOPHAGEAL DILATION;  Surgeon: Malissa Hippo, MD;  [facility];  Service: Endoscopy;  Laterality: N/A;  . ESOPHAGEAL DILATION N/A 05/07/2016   Procedure: ESOPHAGEAL DILATION;  Surgeon: Malissa Hippo, MD;  [facility];  Service: Endoscopy;  Laterality: N/A;  . ESOPHAGEAL DILATION  01/26/2018   Procedure: DILATION OF ANASTOMOTIC  STRICTURE;  Surgeon: Malissa Hippo, MD;  [facility];  Service: Endoscopy;;  .  ESOPHAGOGASTRODUODENOSCOPY N/A 03/03/2016   Procedure: ESOPHAGOGASTRODUODENOSCOPY (EGD);  Surgeon: Malissa Hippo, MD;  [facility];  Service: Endoscopy;  Laterality: N/A;  2:00  . ESOPHAGOGASTRODUODENOSCOPY N/A 05/07/2016   Procedure: ESOPHAGOGASTRODUODENOSCOPY (EGD);  Surgeon: Malissa Hippo, MD;  [facility];  Service: Endoscopy;  Laterality: N/A;  855 - moved to 6/2 @ 10:15 - Ann notified pt  . ESOPHAGOGASTRODUODENOSCOPY N/A 12/28/2017   Procedure: ESOPHAGOGASTRODUODENOSCOPY (EGD) with stricture dilation;  Surgeon: Malissa Hippo, MD;  [facility];  Service: Endoscopy;  Laterality: N/A;  . ESOPHAGOGASTRODUODENOSCOPY N/A 01/26/2018   Procedure: ESOPHAGOGASTRODUODENOSCOPY (EGD);  Surgeon: Malissa Hippo, MD;  [facility];  Service: Endoscopy;  Laterality: N/A;  255  . ESOPHAGOGASTRODUODENOSCOPY N/A 07/14/2018   Procedure: ESOPHAGOGASTRODUODENOSCOPY (EGD);  Surgeon: Lynann Bologna, MD;  Location[facility] END[facility]COPY;  Service: Endoscopy;  Laterality: N/A;  . ESOPHAGOGASTRODUODENOSCOPY (EGD) WITH PROPOFOL N/A 03/05/2016   Procedure: ESOPHAGOGASTRODUODENOSCOPY (EGD) WITH PROPOFOL Anastomotic stricture dilation ;  Surgeon: Malissa Hippo, MD;  [facility];  Service: Endoscopy;  Laterality: N/A;  to be done in OR under fluoro  . ESOPHAGOGASTRODUODENOSCOPY (EGD) WITH PROPOFOL N/A 08/22/2019   Procedure: ESOPHAGOGASTRODUODENOSCOPY (EGD) WITH PROPOFOL With anastomotic stricture dilation.;  Surgeon: Malissa Hippo, MD;  [facility];  Service: Endoscopy;  Laterality: N/A;  . ESOPHAGOGASTRODUODENOSCOPY (EGD) WITH PROPOFOL N/A 08/24/2019   Procedure: ESOPHAGOGASTRODUODENOSCOPY (EGD) WITH PROPOFOL with stricture dilation;  Surgeon: Lionel December  U, MD;  [facility];  Service: Endoscopy;  Laterality: N/A;  . INTRAMEDULLARY (IM) NAIL INTERTROCHANTERIC Right 07/09/2018   Procedure: INTRAMEDULLARY (IM) NAIL INTERTROCHANTRIC;  Surgeon:  Yolonda Kida, MD;  Location: [facility] OR;  Service: Orthopedics;  Laterality: Right;  . STOMACH SURGERY    . TOTAL HIP ARTHROPLASTY      Family History  Problem Relation Age of Onset  . Alzheimer's disease Mother   . COPD Mother   . Throat cancer Father   . Breast cancer Sister     Social History:  reports that she has been smoking cigarettes. She has a 30.00 pack-year smoking history. She has never used smokeless tobacco. She reports previous alcohol use of about 12.0 standard drinks of alcohol per week. She reports that she does not use drugs.  Allergies:  Allergies  Allergen Reactions  . Penicillins Itching, Swelling and Other (See Comments)    Tongue swells Has patient had a PCN reaction causing immediate rash, facial/tongue/throat swelling, SOB or lightheadedness with hypotension: Yes, had a rash and tongue swelled up. No SOB and lightheadedness. Has patient had a PCN reaction causing severe rash involving mucus membranes or skin necrosis: No Has patient had a PCN reaction that required hospitalization: No Has patient had a PCN reaction occurring within the last 10 years: No If all of the above answers are "NO", then may procee    Medications: I have reviewed the patient's current medications.  Results for orders placed or performed during the hospital encounter of 06/25/20 (from the past 48 hour(s))  CBC WITH DIFFERENTIAL     Status: Abnormal (Preliminary result)   Collection Time: 06/25/20  2:41 PM  Result Value Ref Range   WBC PENDING 4.0 - 10.5 K/uL   RBC 3.81 (L) 3.87 - 5.11 MIL/uL   Hemoglobin 11.1 (L) 12.0 - 15.0 g/dL   HCT 23.5 (L) 36 - 46 %   MCV 93.2 80.0 - 100.0 fL   MCH 29.1 26.0 - 34.0 pg   MCHC 31.3 30.0 - 36.0 g/dL   RDW 36.1 (H) 44.3 - 15.4 %   Platelets 335 150 - 400 K/uL    Comment: Performed at [facility], [address] [place], [place] [address]   nRBC PENDING 0.0 - 0.2 %   Neutrophils Relative % PENDING %   Neutro Abs PENDING 1.7 -  7.7 K/uL   Band Neutrophils PENDING %   Lymphocytes Relative PENDING %   Lymphs Abs PENDING 0.7 - 4.0 K/uL   Monocytes Relative PENDING %   Monocytes Absolute PENDING 0 - 1 K/uL   Eosinophils Relative PENDING %   Eosinophils Absolute PENDING 0 - 0 K/uL   Basophils Relative PENDING %   Basophils Absolute PENDING 0 - 0 K/uL   WBC Morphology PENDING    RBC Morphology PENDING    Smear Review PENDING    Other PENDING %   nRBC PENDING 0 /100 WBC   Metamyelocytes Relative PENDING %   Myelocytes PENDING %   Promyelocytes Relative PENDING %   Blasts PENDING %   Immature Granulocytes PENDING %   Abs Immature Granulocytes PENDING 0.00 - 0.07 K/uL  Type and screen [facility]     Status: None (Preliminary result)   Collection Time: 06/25/20  2:45 PM  Result Value Ref Range   ABO/RH(D) PENDING    Antibody Screen PENDING    Sample Expiration      06/28/2020,2359 Performed at [facility]  Lab, [address] [place], [place] [address]     DG Chest 1 View  Result Date: 06/25/2020 CLINICAL DATA:  Fall with hip pain EXAM: CHEST  1 VIEW COMPARISON:  12/26/2017 FINDINGS: Limited by patient rotation. Mild vague opacity in the right upper lobe. No consolidation, pleural effusion or pneumothorax. Stable cardiomediastinal silhouette with aortic atherosclerosis. Surgical changes at the epigastric region. Old right eighth rib fracture. IMPRESSION: 1. Vague right upper lobe opacity, indeterminate for early or mild infiltrate. Consider short interval two-view chest radiographic follow-up. 2. Otherwise no significant interval change since 12/26/2017. Electronically Signed   By: Jasmine Pang M.D.   On: 06/25/2020 15:03   DG Hip Unilat With Pelvis 2-3 Views Left  Result Date: 06/25/2020 CLINICAL DATA:  Fall last night.  Left hip deformity and pain. EXAM: DG HIP (WITH OR WITHOUT PELVIS) 2-3V LEFT COMPARISON:  CT of the abdomen pelvis 08/20/2019 FINDINGS: Comminuted intertrochanteric  fracture is present in the left hip. Femoroacetabular joint is intact. Pelvis is unremarkable. Osteopenia is noted. Right hip ORIF is again noted. IMPRESSION: Comminuted intertrochanteric fracture of the left hip. Electronically Signed   By: Marin Roberts M.D.   On: 06/25/2020 15:01    Review of Systems  HENT: Negative for ear discharge, ear pain, hearing loss and tinnitus.   Eyes: Negative for photophobia and pain.  Respiratory: Negative for cough and shortness of breath.   Cardiovascular: Negative for chest pain.  Gastrointestinal: Negative for abdominal pain, nausea and vomiting.  Genitourinary: Negative for dysuria, flank pain, frequency and urgency.  Musculoskeletal: Positive for arthralgias (Left hip). Negative for back pain, myalgias and neck pain.  Neurological: Negative for dizziness and headaches.  Hematological: Does not bruise/bleed easily.  Psychiatric/Behavioral: The patient is not nervous/anxious.    Blood pressure 107/66, pulse (!) 48, temperature 98.4 F (36.9 C), temperature source Oral, resp. rate 20, SpO2 94 %. Physical Exam Constitutional:      General: She is not in acute distress.    Appearance: She is well-developed. She is not diaphoretic.  HENT:     Head: Normocephalic and atraumatic.  Eyes:     General: No scleral icterus.       Right eye: No discharge.        Left eye: No discharge.     Conjunctiva/sclera: Conjunctivae normal.  Cardiovascular:     Rate and Rhythm: Normal rate and regular rhythm.  Pulmonary:     Effort: Pulmonary effort is normal. No respiratory distress.  Musculoskeletal:     Cervical back: Normal range of motion.     Comments: LLE No traumatic wounds, ecchymosis, or rash  Mod TTP hip  No knee or ankle effusion  Knee stable to varus/ valgus and anterior/posterior stress  Sens DPN, SPN, TN intact  Motor EHL, ext, flex, evers 5/5  DP 1+, PT 1+, No significant edema  Skin:    General: Skin is warm and dry.  Neurological:      Mental Status: She is alert.  Psychiatric:        Behavior: Behavior normal.     Assessment/Plan: Left hip fx -- Plan IMN by Dr. Aundria Rud tomorrow. Please keep NPO after MN. Multiple medical problems including protein calorie malnutrition, gastric outlet obstruction, alcohol abuse, hyponatremia, COPD -- Medicine to admit and clear. Appreciate their help.    Freeman Caldron, PA-C Orthopedic Surgery [phone] 06/25/2020, 3:12 PM

## 2020-06-25 NOTE — ED Notes (Signed)
Per Dr. Lanae Boast, no ortho device at this time for hip fx. RN to reposition pt for comfort

## 2020-06-25 NOTE — ED Triage Notes (Signed)
Pt BIB [facility] c/o a fall. Pt fell last night trying to get up to go to the bathroom. Pt's boyfriend helped her back into the bed and she went back to sleep. Pt woke up this morning unable to move her left leg or get out of bed. EMS was called. Pt as obvious left leg shortening and outer rotation. Pt was given 4mg  of morphine by EMS. Pt currently rating pain 6/10.

## 2020-06-25 NOTE — H&P (Addendum)
History and Physical    Kristine Roberts DUK:[id number] DOB: 11-03-1956 DOA: 06/25/2020  PCP: [facility], [facility]  Patient coming from: Home  I have personally briefly reviewed patient's old medical records in [facility]  Chief Complaint: Fall, hip pain  HPI: Kristine Roberts is a 64 y.o. female with medical history significant of COPD not on home O2 but still smoking, ongoing EtOH abuse, last drink was yesterday, PUD, severe malnutrition, osteoporosis.  Pt with cough, SOB, sputum production, occ fever.  Symptoms ongoing for last 2 weeks.  Persistent but not necessarily worse.  Last night, got up to go to bathroom, legs gave out, fell to floor.  Boyfriend helped her back to bed but this morning was unable to get out of bed due to L leg not working well and having some pain when trying to move it.   ED Course: L intertroch hip fx. On X ray.  CXR LUL PNA.  WBC 14k, no other SIRS.  ECG: irregular sinus with RBBB - chronic and unchanged since at least 2019.   Review of Systems: As per HPI, otherwise all review of systems negative.  Past Medical History:  Diagnosis Date  . COPD (chronic obstructive pulmonary disease) (HCC)   . Dysphagia   . Esophagitis   . Gastric outlet obstruction   . Gastric stenosis   . GERD (gastroesophageal reflux disease)   . Hiatal hernia   . PUD (peptic ulcer disease)   . Wounds, multiple     Past Surgical History:  Procedure Laterality Date  . APPENDECTOMY    . BALLOON DILATION N/A 03/05/2016   Procedure: BALLOON DILATION;  Surgeon: Malissa Hippo, MD;  [facility];  Service: Endoscopy;  Laterality: N/A;  pyloric channel dialtaion  . BALLOON DILATION N/A 07/14/2018   Procedure: BALLOON DILATION;  Surgeon: Lynann Bologna, MD;  Location: [facility] ENDOSCOPY;  Service: Endoscopy;  Laterality: N/A;  . BIOPSY  07/14/2018   Procedure: BIOPSY;  Surgeon: Lynann Bologna, MD;  Location: [facility] ENDOSCOPY;  Service: Endoscopy;;  . CHOLECYSTECTOMY     . ESOPHAGEAL DILATION N/A 03/03/2016   Procedure: ESOPHAGEAL DILATION;  Surgeon: Malissa Hippo, MD;  [facility];  Service: Endoscopy;  Laterality: N/A;  . ESOPHAGEAL DILATION N/A 05/07/2016   Procedure: ESOPHAGEAL DILATION;  Surgeon: Malissa Hippo, MD;  [facility];  Service: Endoscopy;  Laterality: N/A;  . ESOPHAGEAL DILATION  01/26/2018   Procedure: DILATION OF ANASTOMOTIC  STRICTURE;  Surgeon: Malissa Hippo, MD;  [facility];  Service: Endoscopy;;  . ESOPHAGOGASTRODUODENOSCOPY N/A 03/03/2016   Procedure: ESOPHAGOGASTRODUODENOSCOPY (EGD);  Surgeon: Malissa Hippo, MD;  [facility];  Service: Endoscopy;  Laterality: N/A;  2:00  . ESOPHAGOGASTRODUODENOSCOPY N/A 05/07/2016   Procedure: ESOPHAGOGASTRODUODENOSCOPY (EGD);  Surgeon: Malissa Hippo, MD;  [facility];  Service: Endoscopy;  Laterality: N/A;  855 - moved to 6/2 @ 10:15 - Ann notified pt  . ESOPHAGOGASTRODUODENOSCOPY N/A 12/28/2017   Procedure: ESOPHAGOGASTRODUODENOSCOPY (EGD) with stricture dilation;  Surgeon: Malissa Hippo, MD;  [facility];  Service: Endoscopy;  Laterality: N/A;  . ESOPHAGOGASTRODUODENOSCOPY N/A 01/26/2018   Procedure: ESOPHAGOGASTRODUODENOSCOPY (EGD);  Surgeon: Malissa Hippo, MD;  [facility];  Service: Endoscopy;  Laterality: N/A;  255  . ESOPHAGOGASTRODUODENOSCOPY N/A 07/14/2018   Procedure: ESOPHAGOGASTRODUODENOSCOPY (EGD);  Surgeon: Lynann Bologna, MD;  Location: [facility] ENDOSCOPY;  Service: Endoscopy;  Laterality: N/A;  . ESOPHAGOGASTRODUODENOSCOPY (EGD) WITH PROPOFOL N/A 03/05/2016  Procedure: ESOPHAGOGASTRODUODENOSCOPY (EGD) WITH PROPOFOL Anastomotic stricture dilation ;  Surgeon: Malissa Hippo, MD;  [facility];  Service: Endoscopy;  Laterality: N/A;  to be done in OR under fluoro  . ESOPHAGOGASTRODUODENOSCOPY (EGD) WITH PROPOFOL N/A 08/22/2019   Procedure: ESOPHAGOGASTRODUODENOSCOPY (EGD) WITH PROPOFOL With  anastomotic stricture dilation.;  Surgeon: Malissa Hippo, MD;  [facility];  Service: Endoscopy;  Laterality: N/A;  . ESOPHAGOGASTRODUODENOSCOPY (EGD) WITH PROPOFOL N/A 08/24/2019   Procedure: ESOPHAGOGASTRODUODENOSCOPY (EGD) WITH PROPOFOL with stricture dilation;  Surgeon: Malissa Hippo, MD;  [facility];  Service: Endoscopy;  Laterality: N/A;  . INTRAMEDULLARY (IM) NAIL INTERTROCHANTERIC Right 07/09/2018   Procedure: INTRAMEDULLARY (IM) NAIL INTERTROCHANTRIC;  Surgeon: Yolonda Kida, MD;  Location: [facility] OR;  Service: Orthopedics;  Laterality: Right;  . STOMACH SURGERY    . TOTAL HIP ARTHROPLASTY       reports that she has been smoking cigarettes. She has a 30.00 pack-year smoking history. She has never used smokeless tobacco. She reports previous alcohol use of about 12.0 standard drinks of alcohol per week. She reports that she does not use drugs.  Allergies  Allergen Reactions  . Penicillins Itching, Swelling and Other (See Comments)    Tongue swells Has patient had a PCN reaction causing immediate rash, facial/tongue/throat swelling, SOB or lightheadedness with hypotension: Yes, had a rash and tongue swelled up. No SOB and lightheadedness. Has patient had a PCN reaction causing severe rash involving mucus membranes or skin necrosis: No Has patient had a PCN reaction that required hospitalization: No Has patient had a PCN reaction occurring within the last 10 years: No If all of the above answers are "NO", then may procee    Family History  Problem Relation Age of Onset  . Alzheimer's disease Mother   . COPD Mother   . Throat cancer Father   . Breast cancer Sister      Prior to Admission medications   Medication Sig Start Date End Date Taking? Authorizing Provider  acetaminophen (TYLENOL) 325 MG tablet Take 2 tablets (650 mg total) by mouth every 6 (six) hours as needed for mild pain, fever or headache. 08/31/19  Yes Emokpae, Courage, MD    albuterol (PROVENTIL HFA;VENTOLIN HFA) 108 (90 Base) MCG/ACT inhaler Inhale 2 puffs into the lungs every 6 (six) hours as needed for wheezing or shortness of breath.    Yes [provider]  Amino Acids-Protein Hydrolys (FEEDING SUPPLEMENT, PRO-STAT SUGAR FREE 64,) LIQD Take 30 mLs by mouth 2 (two) times daily. 08/31/19  Yes Emokpae, Courage, MD  cyclobenzaprine (FLEXERIL) 10 MG tablet Take 1 tablet (10 mg total) by mouth 3 (three) times daily as needed for muscle spasms (Back pain). 08/31/19  Yes Emokpae, Courage, MD  feeding supplement, ENSURE ENLIVE, (ENSURE ENLIVE) LIQD Take 237 mLs by mouth 3 (three) times daily between meals. 12/30/17  Yes Emokpae, Courage, MD  Multiple Vitamins-Minerals (MULTIVITAMIN WITH MINERALS) tablet Take 1 tablet by mouth daily.   Yes [provider]  tetrahydrozoline 0.05 % ophthalmic solution Place 1 drop into both eyes daily.   Yes [provider]  thiamine 100 MG tablet Take 1 tablet (100 mg total) by mouth daily. 09/01/19  Yes Shon Hale, MD  ALPRAZolam Prudy Feeler) 0.5 MG tablet Take 1 tablet (0.5 mg total) by mouth 2 (two) times daily as needed for anxiety. Patient not taking: Reported on 06/25/2020 08/31/19   Shon Hale, MD  folic acid (FOLVITE) 1 MG tablet Take 1 tablet (1  mg total) by mouth daily. Patient not taking: Reported on 2020/07/02 09/01/19   Shon Hale, MD  insulin aspart (NOVOLOG) 100 UNIT/ML injection Inject 0-9 Units into the skin every 8 (eight) hours. Patient not taking: Reported on 2020/07/02 08/31/19   Shon Hale, MD  lidocaine (LIDODERM) 5 % Place 1 patch onto the skin daily. Remove & Discard patch within 12 hours or as directed by MD Patient not taking: Reported on 02-Jul-2020 08/31/19   Shon Hale, MD  metoCLOPramide (REGLAN) 10 MG tablet Take 1 tablet (10 mg total) by mouth 3 (three) times daily before meals. Patient not taking: Reported on 07/02/2020 08/31/19   Shon Hale, MD  nicotine (NICODERM  CQ - DOSED IN MG/24 HOURS) 21 mg/24hr patch Place 1 patch (21 mg total) onto the skin daily. Patient not taking: Reported on 07-02-20 09/01/19   Shon Hale, MD  ondansetron (ZOFRAN ODT) 4 MG disintegrating tablet Take 1 tablet (4 mg total) by mouth every 8 (eight) hours as needed for nausea or vomiting. Patient not taking: Reported on July 02, 2020 08/31/19   Shon Hale, MD  pantoprazole (PROTONIX) 40 MG tablet Take 1 tablet (40 mg total) by mouth 2 (two) times daily before a meal. Patient not taking: Reported on 07-02-20 08/31/19   Shon Hale, MD  phenol (CHLORASEPTIC) 1.4 % LIQD Use as directed 1 spray in the mouth or throat as needed for throat irritation / pain. Patient not taking: Reported on 07/02/2020 08/31/19   Shon Hale, MD    Physical Exam: Vitals:   2020-07-02 1702 02-Jul-2020 1901 07-02-2020 1902 07/02/20 1908  BP: 126/68 113/69  113/69  Pulse: 69  87 82  Resp: 14 (!) 22 15 18   Temp:      TempSrc:      SpO2: 100%  98% 98%    Constitutional: NAD, calm, comfortable, cachectic Eyes: PERRL, lids and conjunctivae normal ENMT: Mucous membranes are moist. Posterior pharynx clear of any exudate or lesions.Normal dentition.  Neck: normal, supple, no masses, no thyromegaly Respiratory: clear to auscultation bilaterally, no wheezing, no crackles. Normal respiratory effort. No accessory muscle use.  Cardiovascular: Regular rate and rhythm, no murmurs / rubs / gallops. No extremity edema. 2+ pedal pulses. No carotid bruits.  Abdomen: no tenderness, no masses palpated. No hepatosplenomegaly. Bowel sounds positive.  Musculoskeletal: LLE deformity present. Skin: no rashes, lesions, ulcers. No induration Neurologic: CN 2-12 grossly intact. Sensation intact, DTR normal. Strength 5/5 in all 4.  Psychiatric: Normal judgment and insight. Alert and oriented x 3. Normal mood.    Labs on Admission: I have personally reviewed following labs and imaging studies  CBC: Recent Labs    Lab Jul 02, 2020 1441  WBC 14.1*  NEUTROABS 12.0*  HGB 11.1*  HCT 35.5*  MCV 93.2  PLT 335   Basic Metabolic Panel: Recent Labs  Lab 02-Jul-2020 1441  NA 140  K 4.1  CL 101  CO2 28  GLUCOSE 103*  BUN 15  CREATININE 0.60  CALCIUM 8.7*  MG 1.9   GFR: CrCl cannot be calculated (Unknown ideal weight.). Liver Function Tests: No results for input(s): AST, ALT, ALKPHOS, BILITOT, PROT, ALBUMIN in the last 168 hours. No results for input(s): LIPASE, AMYLASE in the last 168 hours. No results for input(s): AMMONIA in the last 168 hours. Coagulation Profile: Recent Labs  Lab 07/02/2020 1441  INR 0.9   Cardiac Enzymes: No results for input(s): CKTOTAL, CKMB, CKMBINDEX, TROPONINI in the last 168 hours. BNP (last 3 results) No results for input(s): PROBNP in  the last 8760 hours. HbA1C: No results for input(s): HGBA1C in the last 72 hours. CBG: No results for input(s): GLUCAP in the last 168 hours. Lipid Profile: No results for input(s): CHOL, HDL, LDLCALC, TRIG, CHOLHDL, LDLDIRECT in the last 72 hours. Thyroid Function Tests: No results for input(s): TSH, T4TOTAL, FREET4, T3FREE, THYROIDAB in the last 72 hours. Anemia Panel: No results for input(s): VITAMINB12, FOLATE, FERRITIN, TIBC, IRON, RETICCTPCT in the last 72 hours. Urine analysis:    Component Value Date/Time   COLORURINE YELLOW 08/22/2019 1010   APPEARANCEUR CLEAR 08/22/2019 1010   LABSPEC 1.014 08/22/2019 1010   PHURINE 6.0 08/22/2019 1010   GLUCOSEU NEGATIVE 08/22/2019 1010   HGBUR NEGATIVE 08/22/2019 1010   BILIRUBINUR NEGATIVE 08/22/2019 1010   KETONESUR NEGATIVE 08/22/2019 1010   PROTEINUR NEGATIVE 08/22/2019 1010   NITRITE NEGATIVE 08/22/2019 1010   LEUKOCYTESUR TRACE (A) 08/22/2019 1010    Radiological Exams on Admission: DG Chest 1 View  Result Date: 06/25/2020 CLINICAL DATA:  Fall with hip pain EXAM: CHEST  1 VIEW COMPARISON:  12/26/2017 FINDINGS: Limited by patient rotation. Mild vague opacity in the  right upper lobe. No consolidation, pleural effusion or pneumothorax. Stable cardiomediastinal silhouette with aortic atherosclerosis. Surgical changes at the epigastric region. Old right eighth rib fracture. IMPRESSION: 1. Vague right upper lobe opacity, indeterminate for early or mild infiltrate. Consider short interval two-view chest radiographic follow-up. 2. Otherwise no significant interval change since 12/26/2017. Electronically Signed   By: Jasmine Pang M.D.   On: 06/25/2020 15:03   DG Hip Unilat With Pelvis 2-3 Views Left  Result Date: 06/25/2020 CLINICAL DATA:  Fall last night.  Left hip deformity and pain. EXAM: DG HIP (WITH OR WITHOUT PELVIS) 2-3V LEFT COMPARISON:  CT of the abdomen pelvis 08/20/2019 FINDINGS: Comminuted intertrochanteric fracture is present in the left hip. Femoroacetabular joint is intact. Pelvis is unremarkable. Osteopenia is noted. Right hip ORIF is again noted. IMPRESSION: Comminuted intertrochanteric fracture of the left hip. Electronically Signed   By: Marin Roberts M.D.   On: 06/25/2020 15:01    EKG: Independently reviewed.  Assessment/Plan Principal Problem:   Closed left hip fracture, initial encounter (HCC) Active Problems:   COPD (chronic obstructive pulmonary disease) (HCC)   Protein-calorie malnutrition, severe   Cachexia (HCC)   ETOH abuse   Gastric ulcer   Community acquired pneumonia of left upper lobe of lung   RBBB    1. Closed L hip fx - 1. Ortho saw pt in consult 2. Spoke with PA 3. Hip fx pathway 4. Single dose Lovenox tonight 5. Will try to get what ever nutrition I can into pt before midnight 6. Ortho will re-assess in AM 7. Does have LUL CAP currently, though ortho does want to get hip fx repair done ASAP 8. On the bright side, at least pain is controlled fairly easily. 9. Cardiac wise: 1. EKG showing chronic RBBB with sinus irregularity - this has been present on all EKGs since at least 2019. 2. Pt had 2d echo in 08/2019 -  echo at that time actually looked good: nl EF, nl valves, etc 2. LUL CAP - 1. No o2 requirement, WBC 14k, no other SIRS 2. Not a "severe" PNA by any means, but certainly an issue when patient needs urgent surgery. 3. PNA pathway 4. Got levaquin in ED 5. Put in for Levaquin per pharm consult 6. Sputum Cx, BCx 7. COVID is negative 3. Protein-Calorie malnutrition, severe - 1. Pt cachectic at baseline 2. This an  ongoing issue, see Sept 2020 admit notes 3. Dietary consult ordered 4. Wound healing will obviously be a major concern 5. Check PO4 6. Note that patient had refeeding syndrome in Sept 2020. 4. EtOH abuse - 1. Pt tells me last drink was yesterday, but didn't drink much 2. Putting on CIWA 5. COPD - 1. Cont home nebs 2. Nicotine patch 6. H/o Gastric ulcer, PUD - 1. Putting pt back on Protonix  DVT prophylaxis: SCDs, single dose lovenox tonight, re-address in AM Code Status: Full Family Communication: No family in room Disposition Plan: CIR vs SNF after hip fx repair Consults called: Ortho surgery, Dr. Aundria Rud Admission status: Admit to inpatient  Severity of Illness: The appropriate patient status for this patient is INPATIENT. Inpatient status is judged to be reasonable and necessary in order to provide the required intensity of service to ensure the patient's safety. The patient's presenting symptoms, physical exam findings, and initial radiographic and laboratory data in the context of their chronic comorbidities is felt to place them at high risk for further clinical deterioration. Furthermore, it is not anticipated that the patient will be medically stable for discharge from the hospital within 2 midnights of admission. The following factors support the patient status of inpatient.   IP status for hip fracture requiring OR repair.   * I certify that at the point of admission it is my clinical judgment that the patient will require inpatient hospital care spanning beyond 2  midnights from the point of admission due to high intensity of service, high risk for further deterioration and high frequency of surveillance required.*    Charlet Harr M. DO [facility]  How to contact the [facility] Attending or Consulting provider 7A - 7P or covering provider during after hours 7P -7A, for this patient?  1. Check the care team in [facility] and look for a) attending/consulting [facility] provider listed and b) the [facility] team listed 2. Log into [email or web address]  Amion Physician Scheduling and messaging for groups and whole hospitals  On call and physician scheduling software for group practices, residents, hospitalists and other medical providers for call, clinic, rotation and shift schedules. On[employer] is a hospital-wide system for scheduling doctors and paging doctors on call. EasyPlot is for scientific plotting and data analysis.  [email or web address]  and use [place]'s universal password to access. If you do not have the password, please contact the hospital operator.  3. Locate the [facility] provider you are looking for under [facility] and page to a number that you can be directly reached. 4. If you still have difficulty reaching the provider, please page the [facility] (Director on Call) for the Hospitalists listed on amion for assistance.  06/25/2020, 8:32 PM

## 2020-06-25 NOTE — ED Provider Notes (Addendum)
[facility] EMERGENCY DEPARTMENT Provider Note   CSN: [id number] Arrival date & time: 06/25/20  1315     History Chief Complaint  Patient presents with  . Fall    Kristine Roberts is a 64 y.o. female.  Patient is a 64 year old female with a history of protein calorie malnutrition, gastric outlet obstruction, alcohol abuse, hyponatremia, COPD who does not require home oxygen who is presenting today with EMS after a fall.  Patient reports for the last 2 weeks she has had a cough, intermittent shortness of breath and occasional sputum production without fever.  She reports that the symptoms have been persistent but not necessarily worse.  Last night she was getting up to go to the bathroom when her legs gave out and she fell to the floor.  Her boyfriend helped her back to bed but this morning she was unable to get out of bed due to her left leg not working well and having some pain when she tries to move it.  She has not used any of her inhalers this morning reports she does feel mildly short of breath but denies any chest pain, abdominal pain, nausea or vomiting.  She does report prior fall resulting in a right hip fracture and replacement but has never injured her left hip before.  She denies any numbness or tingling in the leg.  She did not hit her head or lose consciousness and remembers the event.  She continues to use tobacco but denies using any alcohol at this time.  She does not take any anticoagulation.  The history is provided by the patient.  Fall This is a new problem. The current episode started yesterday. The problem occurs constantly. The problem has not changed since onset.      Past Medical History:  Diagnosis Date  . COPD (chronic obstructive pulmonary disease) (HCC)   . Dysphagia   . Esophagitis   . Gastric outlet obstruction   . Gastric stenosis   . GERD (gastroesophageal reflux disease)   . Hiatal hernia   . PUD (peptic ulcer disease)   . Wounds,  multiple     Patient Active Problem List   Diagnosis Date Noted  . Dehydration 08/22/2019  . Nausea 08/20/2019  . Abnormal liver function 08/20/2019  . Hyponatremia 08/20/2019  . Closed right hip fracture (HCC) 07/08/2018  . Cellulitis of both lower extremities 07/08/2018  . Gastric ulcer 01/11/2018  . Hypokalemia 12/26/2017  . Cachexia (HCC) 12/26/2017  . Hypotension 12/26/2017  . Tobacco abuse 12/26/2017  . ETOH abuse 12/26/2017  . Anemia 12/26/2017  . Pressure injury of skin 12/26/2017  . Protein-calorie malnutrition, severe 03/05/2016  . Gastric outlet obstruction 03/03/2016  . COPD (chronic obstructive pulmonary disease) (HCC) 03/03/2016  . Dysphagia 02/03/2016    Past Surgical History:  Procedure Laterality Date  . APPENDECTOMY    . BALLOON DILATION N/A 03/05/2016   Procedure: BALLOON DILATION;  Surgeon: Malissa Hippo, MD;  [facility];  Service: Endoscopy;  Laterality: N/A;  pyloric channel dialtaion  . BALLOON DILATION N/A 07/14/2018   Procedure: BALLOON DILATION;  Surgeon: Lynann Bologna, MD;  Location: [facility] ENDOSCOPY;  Service: Endoscopy;  Laterality: N/A;  . BIOPSY  07/14/2018   Procedure: BIOPSY;  Surgeon: Lynann Bologna, MD;  Location: [facility] ENDOSCOPY;  Service: Endoscopy;;  . CHOLECYSTECTOMY    . ESOPHAGEAL DILATION N/A 03/03/2016   Procedure: ESOPHAGEAL DILATION;  Surgeon: Malissa Hippo, MD;  Location: [facility];  Service: Endoscopy;  Laterality: N/A;  . ESOPHAGEAL DILATION N/A 05/07/2016   Procedure: ESOPHAGEAL DILATION;  Surgeon: Malissa Hippo, MD;  Location: [facility];  Service: Endoscopy;  Laterality: N/A;  . ESOPHAGEAL DILATION  01/26/2018   Procedure: DILATION OF ANASTOMOTIC  STRICTURE;  Surgeon: Malissa Hippo, MD;  Location: [facility];  Service: Endoscopy;;  . ESOPHAGOGASTRODUODENOSCOPY N/A 03/03/2016   Procedure: ESOPHAGOGASTRODUODENOSCOPY (EGD);  Surgeon: Malissa Hippo, MD;  [facility];  Service: Endoscopy;  Laterality:  N/A;  2:00  . ESOPHAGOGASTRODUODENOSCOPY N/A 05/07/2016   Procedure: ESOPHAGOGASTRODUODENOSCOPY (EGD);  Surgeon: Malissa Hippo, MD;  [facility];  Service: Endoscopy;  Laterality: N/A;  855 - moved to 6/2 @ 10:15 - Ann notified pt  . ESOPHAGOGASTRODUODENOSCOPY N/A 12/28/2017   Procedure: ESOPHAGOGASTRODUODENOSCOPY (EGD) with stricture dilation;  Surgeon: Malissa Hippo, MD;  [facility];  Service: Endoscopy;  Laterality: N/A;  . ESOPHAGOGASTRODUODENOSCOPY N/A 01/26/2018   Procedure: ESOPHAGOGASTRODUODENOSCOPY (EGD);  Surgeon: Malissa Hippo, MD;  [facility];  Service: Endoscopy;  Laterality: N/A;  255  . ESOPHAGOGASTRODUODENOSCOPY N/A 07/14/2018   Procedure: ESOPHAGOGASTRODUODENOSCOPY (EGD);  Surgeon: Lynann Bologna, MD;  Location: [facility] ENDOSCOPY;  Service: Endoscopy;  Laterality: N/A;  . ESOPHAGOGASTRODUODENOSCOPY (EGD) WITH PROPOFOL N/A 03/05/2016   Procedure: ESOPHAGOGASTRODUODENOSCOPY (EGD) WITH PROPOFOL Anastomotic stricture dilation ;  Surgeon: Malissa Hippo, MD;  [facility];  Service: Endoscopy;  Laterality: N/A;  to be done in OR under fluoro  . ESOPHAGOGASTRODUODENOSCOPY (EGD) WITH PROPOFOL N/A 08/22/2019   Procedure: ESOPHAGOGASTRODUODENOSCOPY (EGD) WITH PROPOFOL With anastomotic stricture dilation.;  Surgeon: Malissa Hippo, MD;  [facility];  Service: Endoscopy;  Laterality: N/A;  . ESOPHAGOGASTRODUODENOSCOPY (EGD) WITH PROPOFOL N/A 08/24/2019   Procedure: ESOPHAGOGASTRODUODENOSCOPY (EGD) WITH PROPOFOL with stricture dilation;  Surgeon: Malissa Hippo, MD;  [facility];  Service: Endoscopy;  Laterality: N/A;  . INTRAMEDULLARY (IM) NAIL INTERTROCHANTERIC Right 07/09/2018   Procedure: INTRAMEDULLARY (IM) NAIL INTERTROCHANTRIC;  Surgeon: Yolonda Kida, MD;  Location[facility] OR;  Service: Orthopedics;  Laterality: Right;  . STOMACH SURGERY    . TOTAL HIP ARTHROPLASTY       OB History   No obstetric history on  file.     Family History  Problem Relation Age of Onset  . Alzheimer's disease Mother   . COPD Mother   . Throat cancer Father   . Breast cancer Sister     Social History   Tobacco Use  . Smoking status: Current Every Day Smoker    Packs/day: 1.50    Years: 20.00    Pack years: 30.00    Types: Cigarettes  . Smokeless tobacco: Never Used  Vaping Use  . Vaping Use: Never used  Substance Use Topics  . Alcohol use: Not Currently    Alcohol/week: 12.0 standard drinks    Types: 12 Cans of beer per week    Comment: weekly  . Drug use: No    Home Medications Prior to Admission medications   Medication Sig Start Date End Date Taking? Authorizing Provider  acetaminophen (TYLENOL) 325 MG tablet Take 2 tablets (650 mg total) by mouth every 6 (six) hours as needed for mild pain, fever or headache. 08/31/19   Mariea Clonts, Courage, MD  albuterol (PROVENTIL HFA;VENTOLIN HFA) 108 (90 Base) MCG/ACT inhaler Inhale 2 puffs into the lungs every 6 (six) hours as needed for wheezing or shortness of breath.     [provider]  ALPRAZolam Prudy Feeler) 0.5  MG tablet Take 1 tablet (0.5 mg total) by mouth 2 (two) times daily as needed for anxiety. 08/31/19   Shon Hale, MD  Amino Acids-Protein Hydrolys (FEEDING SUPPLEMENT, PRO-STAT SUGAR FREE 64,) LIQD Take 30 mLs by mouth 2 (two) times daily. 08/31/19   Shon Hale, MD  citalopram (CELEXA) 20 MG tablet Take 20 mg by mouth daily. 06/23/18   [provider]  cyclobenzaprine (FLEXERIL) 10 MG tablet Take 1 tablet (10 mg total) by mouth 3 (three) times daily as needed for muscle spasms (Back pain). 08/31/19   Emokpae, Courage, MD  DULERA 200-5 MCG/ACT AERO Inhale 1 puff into the lungs 2 (two) times daily.  12/22/17   [provider]  feeding supplement, ENSURE ENLIVE, (ENSURE ENLIVE) LIQD Take 237 mLs by mouth 3 (three) times daily between meals. 12/30/17   Shon Hale, MD  folic acid (FOLVITE) 1 MG tablet Take 1 tablet (1 mg  total) by mouth daily. 09/01/19   Shon Hale, MD  insulin aspart (NOVOLOG) 100 UNIT/ML injection Inject 0-9 Units into the skin every 8 (eight) hours. 08/31/19   Shon Hale, MD  lidocaine (LIDODERM) 5 % Place 1 patch onto the skin daily. Remove & Discard patch within 12 hours or as directed by MD 08/31/19   Shon Hale, MD  metoCLOPramide (REGLAN) 10 MG tablet Take 1 tablet (10 mg total) by mouth 3 (three) times daily before meals. 08/31/19   Shon Hale, MD  mirtazapine (REMERON) 15 MG tablet Take 1 tablet (15 mg total) by mouth at bedtime. 08/31/19   Shon Hale, MD  Multiple Vitamins-Minerals (MULTIVITAMIN WITH MINERALS) tablet Take 1 tablet by mouth daily.    [provider]  nicotine (NICODERM CQ - DOSED IN MG/24 HOURS) 21 mg/24hr patch Place 1 patch (21 mg total) onto the skin daily. 09/01/19   Shon Hale, MD  ondansetron (ZOFRAN ODT) 4 MG disintegrating tablet Take 1 tablet (4 mg total) by mouth every 8 (eight) hours as needed for nausea or vomiting. 08/31/19   Shon Hale, MD  pantoprazole (PROTONIX) 40 MG tablet Take 1 tablet (40 mg total) by mouth 2 (two) times daily before a meal. 08/31/19   Emokpae, Courage, MD  phenol (CHLORASEPTIC) 1.4 % LIQD Use as directed 1 spray in the mouth or throat as needed for throat irritation / pain. 08/31/19   Shon Hale, MD  tetrahydrozoline 0.05 % ophthalmic solution Place 1 drop into both eyes daily.    [provider]  thiamine 100 MG tablet Take 1 tablet (100 mg total) by mouth daily. 09/01/19   Shon Hale, MD    Allergies    Penicillins  Review of Systems   Review of Systems  All other systems reviewed and are negative.   Physical Exam Updated Vital Signs BP 107/66 (BP Location: Left Arm)   Pulse (!) 48   Temp 98.4 F (36.9 C) (Oral)   Resp 20   SpO2 94%   Physical Exam Vitals and nursing note reviewed.  Constitutional:      General: She is not in acute distress.     Appearance: She is well-developed. She is cachectic.  HENT:     Head: Normocephalic and atraumatic.  Eyes:     Pupils: Pupils are equal, round, and reactive to light.  Cardiovascular:     Rate and Rhythm: Regular rhythm. Bradycardia present.     Heart sounds: Normal heart sounds. No murmur heard.  No friction rub.  Pulmonary:     Effort: Pulmonary effort  is normal.     Breath sounds: No wheezing or rales.     Comments: Decreased breath sounds throughout without significant wheezing. Abdominal:     General: Bowel sounds are normal. There is no distension.     Palpations: Abdomen is soft.     Tenderness: There is no abdominal tenderness. There is no guarding or rebound.  Musculoskeletal:        General: Deformity present.     Left hip: Deformity and bony tenderness present. Decreased range of motion.       Legs:     Comments: No edema  Skin:    General: Skin is warm and dry.     Findings: No rash.  Neurological:     Mental Status: She is alert and oriented to person, place, and time.     Cranial Nerves: No cranial nerve deficit.  Psychiatric:        Mood and Affect: Mood normal.        Behavior: Behavior normal.        Thought Content: Thought content normal.     ED Results / Procedures / Treatments   Labs (all labs ordered are listed, but only abnormal results are displayed) Labs Reviewed  BASIC METABOLIC PANEL  CBC WITH DIFFERENTIAL/PLATELET  PROTIME-INR  TYPE AND SCREEN    EKG EKG Interpretation  Date/Time:  Wednesday June 25 2020 14:40:16 EDT Ventricular Rate:  60 PR Interval:    QRS Duration: 133 QT Interval:  440 QTC Calculation: 440 R Axis:   -143 Text Interpretation: Sinus rhythm Consider left atrial enlargement Right bundle branch block No significant change since last tracing Confirmed by Gwyneth Sprout ([id number]) on 06/25/2020 2:46:15 PM   Radiology DG Chest 1 View  Result Date: 06/25/2020 CLINICAL DATA:  Fall with hip pain EXAM: CHEST  1 VIEW  COMPARISON:  12/26/2017 FINDINGS: Limited by patient rotation. Mild vague opacity in the right upper lobe. No consolidation, pleural effusion or pneumothorax. Stable cardiomediastinal silhouette with aortic atherosclerosis. Surgical changes at the epigastric region. Old right eighth rib fracture. IMPRESSION: 1. Vague right upper lobe opacity, indeterminate for early or mild infiltrate. Consider short interval two-view chest radiographic follow-up. 2. Otherwise no significant interval change since 12/26/2017. Electronically Signed   By: Jasmine Pang M.D.   On: 06/25/2020 15:03   DG Hip Unilat With Pelvis 2-3 Views Left  Result Date: 06/25/2020 CLINICAL DATA:  Fall last night.  Left hip deformity and pain. EXAM: DG HIP (WITH OR WITHOUT PELVIS) 2-3V LEFT COMPARISON:  CT of the abdomen pelvis 08/20/2019 FINDINGS: Comminuted intertrochanteric fracture is present in the left hip. Femoroacetabular joint is intact. Pelvis is unremarkable. Osteopenia is noted. Right hip ORIF is again noted. IMPRESSION: Comminuted intertrochanteric fracture of the left hip. Electronically Signed   By: Marin Roberts M.D.   On: 06/25/2020 15:01    Procedures Procedures (including critical care time)  Medications Ordered in ED Medications  albuterol (VENTOLIN HFA) 108 (90 Base) MCG/ACT inhaler 2 puff (has no administration in time range)  AeroChamber Plus Flo-Vu Medium MISC 1 each (has no administration in time range)    ED Course  I have reviewed the triage vital signs and the nursing notes.  Pertinent labs & imaging results that were available during my care of the patient were reviewed by me and considered in my medical decision making (see chart for details).    MDM Rules/Calculators/A&P  Elderly female presenting today looking older than her stated age after a fall at home.  Patient is having URI symptoms which have been persistent for the last 2 weeks.  She denies fever but has had  intermittent productive cough.  Patient is satting 96% on room air and does not have any significant wheezing on exam.  However will do chest x-ray and labs for further evaluation.  Patient does not appear fluid overloaded at this time.  Because of her fall she does have deformity of the left lower extremity concern for hip fracture versus dislocation.  Patient denies hitting her head or loss of consciousness.  Patient's mental status is normal.  She was given 4 mg of morphine by EMS prior to arrival and reports no pain in her leg at this time.  Hip fracture protocol initiated.  Patient given albuterol as she has not had any today and placed on 2 L of oxygen for comfort.  2:46 PM EKG is unchanged.  Plain films show left intertrochanteric femur fracture.  Labs are still pending.  Orthopedics notified.  Dr. Aundria Rud with [facility] repaired her right hip in the past.  Patient will need medicine admission once labs return.  3:11 PM CXR with possible early infiltrate and given hx of cough for 2 week that is persistent and medical hx will give levaquin due to pcn allergy with tongue swelling.  MDM Number of Diagnoses or Management Options   Amount and/or Complexity of Data Reviewed Tests in the radiology section of CPT: ordered and reviewed Tests in the medicine section of CPT: ordered and reviewed Decide to obtain previous medical records or to obtain history from someone other than the patient: yes Obtain history from someone other than the patient: yes Review and summarize past medical records: yes Discuss the patient with other providers: yes Independent visualization of images, tracings, or specimens: yes  Risk of Complications, Morbidity, and/or Mortality Presenting problems: high Diagnostic procedures: low Management options: low  Patient Progress Patient progress: stable    Final Clinical Impression(s) / ED Diagnoses Final diagnoses:  Fall, initial encounter  Displaced  intertrochanteric fracture of left femur, initial encounter for closed fracture [facility])    Rx / DC Orders ED Discharge Orders    None       Gwyneth Sprout, MD 06/25/20 1449    Gwyneth Sprout, MD 06/25/20 1513

## 2020-06-26 DIAGNOSIS — S72002A Fracture of unspecified part of neck of left femur, initial encounter for closed fracture: Secondary | ICD-10-CM

## 2020-06-26 LAB — BASIC METABOLIC PANEL
Anion gap: 11 (ref 5–15)
Anion gap: 15 (ref 5–15)
Anion gap: 14 (ref 5–15)
BUN: 13 mg/dL (ref 8–23)
BUN: 14 mg/dL (ref 8–23)
BUN: 16 mg/dL (ref 8–23)
CO2: 30 mmol/L (ref 22–32)
CO2: 26 mmol/L (ref 22–32)
CO2: 24 mmol/L (ref 22–32)
Calcium: 8.8 mg/dL — ABNORMAL LOW (ref 8.9–10.3)
Calcium: 8.7 mg/dL — ABNORMAL LOW (ref 8.9–10.3)
Calcium: 8.5 mg/dL — ABNORMAL LOW (ref 8.9–10.3)
Chloride: 99 mmol/L (ref 98–111)
Chloride: 99 mmol/L (ref 98–111)
Chloride: 102 mmol/L (ref 98–111)
Creatinine, Ser: 0.55 mg/dL (ref 0.44–1.00)
Creatinine, Ser: 0.58 mg/dL (ref 0.44–1.00)
Creatinine, Ser: 0.67 mg/dL (ref 0.44–1.00)
GFR calc Af Amer: >60 mL/min (ref >60)
GFR calc Af Amer: >60 mL/min (ref >60)
GFR calc Af Amer: >60 mL/min (ref >60)
GFR calc non Af Amer: >60 mL/min (ref >60)
GFR calc non Af Amer: >60 mL/min (ref >60)
GFR calc non Af Amer: >60 mL/min (ref >60)
Glucose, Bld: 93 mg/dL (ref 70–99)
Glucose, Bld: 93 mg/dL (ref 70–99)
Glucose, Bld: 76 mg/dL (ref 70–99)
Potassium: 2.4 mmol/L — CL (ref 3.5–5.1)
Potassium: 2.6 mmol/L — CL (ref 3.5–5.1)
Potassium: 3.2 mmol/L — ABNORMAL LOW (ref 3.5–5.1)
Sodium: 140 mmol/L (ref 135–145)
Sodium: 140 mmol/L (ref 135–145)
Sodium: 140 mmol/L (ref 135–145)

## 2020-06-26 LAB — CBC
HCT: 35.1 % — ABNORMAL LOW (ref 36.0–46.0)
Hemoglobin: 11.1 g/dL — ABNORMAL LOW (ref 12.0–15.0)
MCH: 29.7 pg (ref 26.0–34.0)
MCHC: 31.6 g/dL (ref 30.0–36.0)
MCV: 93.9 fL (ref 80.0–100.0)
Platelets: 316 10*3/uL (ref 150–400)
RBC: 3.74 MIL/uL — ABNORMAL LOW (ref 3.87–5.11)
RDW: 17.2 % — ABNORMAL HIGH (ref 11.5–15.5)
WBC: 12.5 10*3/uL — ABNORMAL HIGH (ref 4.0–10.5)
nRBC: 0 % (ref 0.0–0.2)

## 2020-06-26 LAB — PHOSPHORUS: Phosphorus: 2.9 mg/dL (ref 2.5–4.6)

## 2020-06-26 LAB — HIV ANTIBODY (ROUTINE TESTING W REFLEX): HIV Screen 4th Generation wRfx: NONREACTIVE

## 2020-06-26 MED ORDER — POVIDONE-IODINE 10 % EX SWAB: 2.0000 "application " | Freq: Once | CUTANEOUS | Status: DC

## 2020-06-26 MED ORDER — POTASSIUM CHLORIDE 10 MEQ/100ML IV SOLN
10.0000 meq | INTRAVENOUS | Status: AC
  Administered 2020-06-26 (×4): 10 meq via INTRAVENOUS
  Filled 2020-06-26 (×4): qty 100

## 2020-06-26 MED ORDER — POTASSIUM CHLORIDE CRYS ER 20 MEQ PO TBCR
40.0000 meq | EXTENDED_RELEASE_TABLET | Freq: Once | ORAL | Status: AC
  Administered 2020-06-27: 40 meq via ORAL
  Filled 2020-06-26 (×2): qty 2

## 2020-06-26 MED ORDER — VANCOMYCIN HCL IN DEXTROSE 1-5 GM/200ML-% IV SOLN
1000.0000 mg | INTRAVENOUS | Status: DC
  Filled 2020-06-26: qty 200

## 2020-06-26 MED ORDER — POTASSIUM CHLORIDE CRYS ER 20 MEQ PO TBCR
40.0000 meq | EXTENDED_RELEASE_TABLET | Freq: Once | ORAL | Status: AC
  Administered 2020-06-26: 40 meq via ORAL
  Filled 2020-06-26: qty 2

## 2020-06-26 MED ORDER — CHLORHEXIDINE GLUCONATE 4 % EX LIQD
60.0000 mL | Freq: Once | CUTANEOUS | Status: AC
  Administered 2020-06-27: 4 via TOPICAL
  Filled 2020-06-26 (×2): qty 60

## 2020-06-26 MED ORDER — SODIUM CHLORIDE 0.9 % IV SOLN: INTRAVENOUS | Status: DC

## 2020-06-26 MED ORDER — ENSURE PRE-SURGERY PO LIQD
296.0000 mL | Freq: Once | ORAL | Status: DC
  Filled 2020-06-26: qty 296

## 2020-06-26 NOTE — ED Notes (Signed)
Placed on cardiac monitoring

## 2020-06-26 NOTE — Progress Notes (Signed)
K drop to 2.4 noted.  Unclear if true value or lab error.  If true value, then worrisome for re-feeding syndrome.  Getting stat repeat BMP.  Adding stat Phos level.  Changing bed request level to tele.

## 2020-06-26 NOTE — ED Notes (Signed)
Patient refused patient medicine at this time.

## 2020-06-26 NOTE — ED Notes (Signed)
Pt c/o of worsening left side hip pain. Refused IV pain medication. Ice applied

## 2020-06-26 NOTE — ED Notes (Signed)
Pt's initial K 4.1 on admission repeat K this morning 2.4. Repeat labs ordered for verification

## 2020-06-26 NOTE — Progress Notes (Addendum)
PROGRESS NOTE  Kristine Roberts  DOB: 01-27-1956  PCP: [facility] [place] [facility] BEM:[id number]  DOA: 06/25/2020  LOS: 1 day   Chief Complaint  Patient presents with  . Fall   Brief narrative: Kristine Roberts is a 64 y.o. female with PMH significant of COPD, continues to smoke, continues to use alcohol, last drink 1 day prior to admission, history of peptic ulcer disease, severe malnutrition, osteoporosis. Patient presented to the ED on 7/21 with complaint of cough, dyspnea, excessive phlegm and occasional fever, gradually worsening for 2 weeks. The night previous, patient got up to go to bathroom, legs gave out, fell to floor.    Her boyfriend helped her back to bed but next morning she had generalized weakness and was unable to get out of bed.  In the ED, patient was afebrile, hemodynamically stable, required 2 L oxygen by nasal cannula. Labs with WBC 14.1. Left hip x-ray showed left intertrochanteric hip fracture. CXR suggestive of left upper lobe pneumonia. ECG with irregular sinus with RBBB - chronic and unchanged since at least 2019.  Subjective: Patient was seen and examined this morning in the ED.  Pending bed availability.  Pain controlled. Chart reviewed. Subsequent blood work this morning shows potassium level low at 2.4.  WBC count improving, 12.5 this morning.  Assessment/Plan: Closed L hip fx - -fall due to weakness.   -Seen by orthopedics.  Plan for surgical fixation.  Postponed for tomorrow because of severe hypokalemia. -Pain control and DVT prophylaxis per orthopedic -Vitamin D supplement for osteoporosis.  Severe hypokalemia -Potassium level low at 2.6 today.  Noted 40 mEq IV replacement ordered earlier.  I would add another 40 mg oral. -Repeat potassium level this afternoon.  Perioperative medical risk assessment -Patient complains of 2 weeks of cough, dyspnea and excessive phlegm production. -This is likely secondary to pneumonia and COPD and does not  reflect cardiac status. -EKG with chronic right bundle branch block and no significant ST-T wave changes. -Pt had 2d echo in 08/2019 - echo at that time actually looked good: nl EF, nl valves, etc -At this time, patient is at acceptable risk for planned surgery.  Left upper lobe pneumonia Acute respiratory failure with hypoxia -No fever, WBC count elevated to 14,000.  Chest x-ray with possible pneumonia. -Started on IV Rocephin and IV azithromycin on admission. Continue the same. -Stable respiratory status.  Continue to monitor. -Order for sputum culture.  Blood culture. -COVID-19 negative. -Does not use oxygen at home. Currently on supplemental oxygen at 2 L/min.  COPD Chronic everyday smoker -Counseled to quit smoking. Continue bronchodilators.  Protein-Calorie malnutrition, severe -Pt cachectic at baseline -Dietary consult ordered  -Noted that patient had refeeding syndrome in Sept 2020.  Chronic EtOH abuse - -last drink 1 night prior to admission  -Currently on CIWA protocol.    H/o Gastric ulcer, PUD -continue Protonix.  Mobility: PT eval postprocedure Code Status:   Code Status: Full Code  Nutritional status: There is no height or weight on file to calculate BMI.     Diet Order            Diet NPO time specified  Diet effective midnight           Diet regular Room service appropriate? Yes; Fluid consistency: Thin  Diet effective now                 DVT prophylaxis: SCDs Start: 06/25/20 1942   Antimicrobials:  IV Rocephin and IV azithromycin Fluid: I will start  on gentle hydration with normal saline at 75 mill per hour  Consultants: Orthopedics Family Communication:  None at bedside  Status is: Inpatient  Remains inpatient appropriate because:Ongoing active pain requiring inpatient pain management and Ongoing diagnostic testing needed not appropriate for outpatient work up   Dispo: The patient is from: Home              Anticipated d/c is to: Home  Vs  SNF patient PT eval post procedure.              Anticipated d/c date is: 3 days              Patient currently is not medically stable to d/c.       Infusions:  . sodium chloride 75 mL/hr at 06/26/20 0942  . azithromycin    . cefTRIAXone (ROCEPHIN)  IV    . potassium chloride 10 mEq (06/26/20 0941)  . vancomycin      Scheduled Meds: . chlorhexidine  60 mL Topical Once  . feeding supplement  296 mL Oral Once  . feeding supplement (PRO-STAT SUGAR FREE 64)  30 mL Oral BID  . folic acid  1 mg Oral Daily  . multivitamin with minerals  1 tablet Oral Daily  . nicotine  21 mg Transdermal Daily  . pantoprazole  40 mg Oral BID  . potassium chloride  40 mEq Oral Once  . povidone-iodine  2 application Topical Once  . thiamine  100 mg Oral Daily   Or  . thiamine  100 mg Intravenous Daily    Antimicrobials: Anti-infectives (From admission, onward)   Start     Dose/Rate Route Frequency Ordered Stop   06/26/20 1600  cefTRIAXone (ROCEPHIN) 1 g in sodium chloride 0.9 % 100 mL IVPB     Discontinue     1 g 200 mL/hr over 30 Minutes Intravenous Every 24 hours 06/25/20 2006     06/26/20 1600  azithromycin (ZITHROMAX) 500 mg in sodium chloride 0.9 % 250 mL IVPB     Discontinue     500 mg 250 mL/hr over 60 Minutes Intravenous Every 24 hours 06/25/20 2006     06/26/20 0730  vancomycin (VANCOCIN) IVPB 1000 mg/200 mL premix     Discontinue     1,000 mg 200 mL/hr over 60 Minutes Intravenous On call to O.R. 06/26/20 0715 06/27/20 0559   06/25/20 1515  levofloxacin (LEVAQUIN) IVPB 750 mg        750 mg 100 mL/hr over 90 Minutes Intravenous  Once 06/25/20 1513 06/25/20 1839      PRN meds: albuterol, cyclobenzaprine, fentaNYL (SUBLIMAZE) injection, HYDROcodone-acetaminophen, LORazepam **OR** LORazepam, naphazoline-glycerin   Objective: Vitals:   06/26/20 1017 06/26/20 1024  BP:  (!) 130/70  Pulse: 85 85  Resp: 16 18  Temp:  98 F (36.7 C)  SpO2: 96% 98%   No intake or output data in  the 24 hours ending 06/26/20 1106 There were no vitals filed for this visit. Weight change:  There is no height or weight on file to calculate BMI.   Physical Exam: General exam: Appears calm and comfortable.  Pain controlled.  Cachectic Skin: No rashes, lesions or ulcers. HEENT: Atraumatic, normocephalic, supple neck, no obvious bleeding Lungs: Bilateral and expiratory wheezing mild scattered.  Not in respiratory distress CVS: Regular rate and rhythm, no murmur GI/Abd soft, nontender, nondistended, bowel sound present CNS: Alert, awake oriented x3 Psychiatry: Mood appropriate Extremities: No pedal edema, no calf tenderness  Data Review: I  have personally reviewed the laboratory data and studies available.  Recent Labs  Lab 06/25/20 1441 06/26/20 0343  WBC 14.1* 12.5*  NEUTROABS 12.0*  --   HGB 11.1* 11.1*  HCT 35.5* 35.1*  MCV 93.2 93.9  PLT 335 316   Recent Labs  Lab 06/25/20 1441 06/25/20 2021 06/26/20 0343 06/26/20 0552  NA 140  --  140 140  K 4.1  --  2.4* 2.6*  CL 101  --  99 99  CO2 28  --  30 26  GLUCOSE 103*  --  93 93  BUN 15  --  13 14  CREATININE 0.60  --  0.55 0.58  CALCIUM 8.7*  --  8.8* 8.7*  MG 1.9  --   --   --   PHOS  --  3.0  --  2.9    Signed, Lorin Glass, MD [facility] Pager: [phone] (Secure Chat preferred). 06/26/2020

## 2020-06-26 NOTE — ED Notes (Signed)
Pt extremely agitated. Repeating desire to sign out AMA. MD notified, advised about pt confusion and status. MD advised pt not capable of signing out AMA.

## 2020-06-26 NOTE — Progress Notes (Signed)
Placed call to ER four times to make aware of potassium 2.6. Unable to reach RN. Anesthesia made aware and stated to contact surgeon. Surgeon made aware and stated he would call PA in ED to have potassium corrected prior to surgery today.

## 2020-06-27 ENCOUNTER — Encounter (HOSPITAL_COMMUNITY)
Admission: EM | Disposition: A | Discharge status: Home Health | Payer: Self-pay | Source: Home / Self Care | Attending: Internal Medicine

## 2020-06-27 ENCOUNTER — Inpatient Hospital Stay (HOSPITAL_COMMUNITY): Payer: Medicare Other

## 2020-06-27 ENCOUNTER — Encounter (HOSPITAL_COMMUNITY): Payer: Self-pay | Admitting: Internal Medicine

## 2020-06-27 ENCOUNTER — Inpatient Hospital Stay (HOSPITAL_COMMUNITY): Payer: Medicare Other | Admitting: Anesthesiology

## 2020-06-27 DIAGNOSIS — J189 Pneumonia, unspecified organism: Secondary | ICD-10-CM | POA: Diagnosis not present

## 2020-06-27 DIAGNOSIS — J439 Emphysema, unspecified: Secondary | ICD-10-CM | POA: Diagnosis not present

## 2020-06-27 DIAGNOSIS — S72002A Fracture of unspecified part of neck of left femur, initial encounter for closed fracture: Secondary | ICD-10-CM | POA: Diagnosis not present

## 2020-06-27 HISTORY — PX: INTRAMEDULLARY (IM) NAIL INTERTROCHANTERIC: SHX5875

## 2020-06-27 LAB — CBC WITH DIFFERENTIAL/PLATELET
Abs Immature Granulocytes: 0.04 10*3/uL (ref 0.00–0.07)
Basophils Absolute: 0.1 10*3/uL (ref 0.0–0.1)
Basophils Relative: 1 %
Eosinophils Absolute: 0.2 10*3/uL (ref 0.0–0.5)
Eosinophils Relative: 2 %
HCT: 32.9 % — ABNORMAL LOW (ref 36.0–46.0)
Hemoglobin: 10.2 g/dL — ABNORMAL LOW (ref 12.0–15.0)
Immature Granulocytes: 0 %
Lymphocytes Relative: 14 %
Lymphs Abs: 1.3 10*3/uL (ref 0.7–4.0)
MCH: 29.4 pg (ref 26.0–34.0)
MCHC: 31 g/dL (ref 30.0–36.0)
MCV: 94.8 fL (ref 80.0–100.0)
Monocytes Absolute: 1.3 10*3/uL — ABNORMAL HIGH (ref 0.1–1.0)
Monocytes Relative: 13 %
Neutro Abs: 6.7 10*3/uL (ref 1.7–7.7)
Neutrophils Relative %: 70 %
Platelets: 290 10*3/uL (ref 150–400)
RBC: 3.47 MIL/uL — ABNORMAL LOW (ref 3.87–5.11)
RDW: 16.9 % — ABNORMAL HIGH (ref 11.5–15.5)
WBC: 9.6 10*3/uL (ref 4.0–10.5)
nRBC: 0 % (ref 0.0–0.2)

## 2020-06-27 LAB — SURGICAL PCR SCREEN
MRSA, PCR: NEGATIVE
Staphylococcus aureus: NEGATIVE

## 2020-06-27 LAB — BASIC METABOLIC PANEL
Anion gap: 10 (ref 5–15)
BUN: 14 mg/dL (ref 8–23)
CO2: 25 mmol/L (ref 22–32)
Calcium: 8.5 mg/dL — ABNORMAL LOW (ref 8.9–10.3)
Chloride: 106 mmol/L (ref 98–111)
Creatinine, Ser: 0.55 mg/dL (ref 0.44–1.00)
GFR calc Af Amer: >60 mL/min (ref >60)
GFR calc non Af Amer: >60 mL/min (ref >60)
Glucose, Bld: 81 mg/dL (ref 70–99)
Potassium: 4.3 mmol/L (ref 3.5–5.1)
Sodium: 141 mmol/L (ref 135–145)

## 2020-06-27 SURGERY — FIXATION, FRACTURE, INTERTROCHANTERIC, WITH INTRAMEDULLARY ROD
Anesthesia: Spinal | Laterality: Left

## 2020-06-27 MED ORDER — PROPOFOL 10 MG/ML IV BOLUS
INTRAVENOUS | Status: DC | PRN
  Administered 2020-06-27: 20 mg via INTRAVENOUS

## 2020-06-27 MED ORDER — ASPIRIN EC 325 MG PO TBEC
325.0000 mg | DELAYED_RELEASE_TABLET | Freq: Every day | ORAL | Status: DC
  Administered 2020-06-28 – 2020-07-03 (×6): 325 mg via ORAL
  Filled 2020-06-27 (×6): qty 1

## 2020-06-27 MED ORDER — ONDANSETRON HCL 4 MG/2ML IJ SOLN
4.0000 mg | Freq: Four times a day (QID) | INTRAMUSCULAR | Status: DC | PRN
  Administered 2020-06-28: 4 mg via INTRAVENOUS
  Filled 2020-06-27: qty 2

## 2020-06-27 MED ORDER — ONDANSETRON HCL 4 MG PO TABS
4.0000 mg | ORAL_TABLET | Freq: Four times a day (QID) | ORAL | Status: DC | PRN
  Administered 2020-06-29 – 2020-07-03 (×3): 4 mg via ORAL
  Filled 2020-06-27 (×3): qty 1

## 2020-06-27 MED ORDER — ORAL CARE MOUTH RINSE: 15.0000 mL | Freq: Once | OROMUCOSAL | Status: AC

## 2020-06-27 MED ORDER — PROPOFOL 10 MG/ML IV BOLUS
INTRAVENOUS | Status: AC
  Filled 2020-06-27: qty 20

## 2020-06-27 MED ORDER — MOMETASONE FURO-FORMOTEROL FUM 100-5 MCG/ACT IN AERO
2.0000 | INHALATION_SPRAY | Freq: Two times a day (BID) | RESPIRATORY_TRACT | Status: DC
  Administered 2020-06-27 – 2020-07-02 (×11): 2 via RESPIRATORY_TRACT
  Filled 2020-06-27: qty 8.8

## 2020-06-27 MED ORDER — VANCOMYCIN HCL IN DEXTROSE 1-5 GM/200ML-% IV SOLN
INTRAVENOUS | Status: AC
  Administered 2020-06-27: 1000 mg via INTRAVENOUS
  Filled 2020-06-27: qty 200

## 2020-06-27 MED ORDER — METOCLOPRAMIDE HCL 5 MG PO TABS: 5.0000 mg | ORAL_TABLET | Freq: Three times a day (TID) | ORAL | Status: DC | PRN

## 2020-06-27 MED ORDER — FENTANYL CITRATE (PF) 100 MCG/2ML IJ SOLN
INTRAMUSCULAR | Status: AC
  Administered 2020-06-27: 25 ug via INTRAVENOUS
  Filled 2020-06-27: qty 2

## 2020-06-27 MED ORDER — VANCOMYCIN HCL IN DEXTROSE 1-5 GM/200ML-% IV SOLN: 1000.0000 mg | Freq: Once | INTRAVENOUS | Status: AC

## 2020-06-27 MED ORDER — ENSURE ENLIVE PO LIQD
237.0000 mL | Freq: Three times a day (TID) | ORAL | Status: DC
  Administered 2020-06-27 – 2020-07-02 (×14): 237 mL via ORAL

## 2020-06-27 MED ORDER — MIDAZOLAM HCL 5 MG/5ML IJ SOLN
INTRAMUSCULAR | Status: DC | PRN
  Administered 2020-06-27: 2 mg via INTRAVENOUS

## 2020-06-27 MED ORDER — MOMETASONE FURO-FORMOTEROL FUM 100-5 MCG/ACT IN AERO
2.0000 | INHALATION_SPRAY | Freq: Two times a day (BID) | RESPIRATORY_TRACT | Status: DC
  Filled 2020-06-27: qty 8.8

## 2020-06-27 MED ORDER — 0.9 % SODIUM CHLORIDE (POUR BTL) OPTIME
TOPICAL | Status: DC | PRN
  Administered 2020-06-27: 1000 mL

## 2020-06-27 MED ORDER — PHENYLEPHRINE HCL-NACL 10-0.9 MG/250ML-% IV SOLN
INTRAVENOUS | Status: DC | PRN
  Administered 2020-06-27: 30 ug/min via INTRAVENOUS

## 2020-06-27 MED ORDER — FENTANYL CITRATE (PF) 100 MCG/2ML IJ SOLN
25.0000 ug | INTRAMUSCULAR | Status: DC | PRN
  Administered 2020-06-27: 25 ug via INTRAVENOUS

## 2020-06-27 MED ORDER — CHLORHEXIDINE GLUCONATE 0.12 % MT SOLN
15.0000 mL | Freq: Once | OROMUCOSAL | Status: AC
  Administered 2020-06-27: 15 mL via OROMUCOSAL
  Filled 2020-06-27: qty 15

## 2020-06-27 MED ORDER — ONDANSETRON HCL 4 MG/2ML IJ SOLN
INTRAMUSCULAR | Status: DC | PRN
  Administered 2020-06-27: 4 mg via INTRAVENOUS

## 2020-06-27 MED ORDER — PROPOFOL 500 MG/50ML IV EMUL
INTRAVENOUS | Status: DC | PRN
  Administered 2020-06-27: 50 ug/kg/min via INTRAVENOUS

## 2020-06-27 MED ORDER — ONDANSETRON HCL 4 MG/2ML IJ SOLN: 4.0000 mg | Freq: Once | INTRAMUSCULAR | Status: DC | PRN

## 2020-06-27 MED ORDER — FENTANYL CITRATE (PF) 100 MCG/2ML IJ SOLN
INTRAMUSCULAR | Status: DC | PRN
  Administered 2020-06-27: 50 ug via INTRAVENOUS

## 2020-06-27 MED ORDER — LACTATED RINGERS IV SOLN: INTRAVENOUS | Status: DC

## 2020-06-27 MED ORDER — PHENOL 1.4 % MT LIQD: 1.0000 | OROMUCOSAL | Status: DC | PRN

## 2020-06-27 MED ORDER — METOCLOPRAMIDE HCL 5 MG/ML IJ SOLN: 5.0000 mg | Freq: Three times a day (TID) | INTRAMUSCULAR | Status: DC | PRN

## 2020-06-27 MED ORDER — MIDAZOLAM HCL 2 MG/2ML IJ SOLN
INTRAMUSCULAR | Status: AC
  Filled 2020-06-27: qty 2

## 2020-06-27 MED ORDER — ALBUTEROL SULFATE HFA 108 (90 BASE) MCG/ACT IN AERS
2.0000 | INHALATION_SPRAY | Freq: Once | RESPIRATORY_TRACT | Status: AC
  Administered 2020-06-27: 2 via RESPIRATORY_TRACT
  Filled 2020-06-27: qty 6.7

## 2020-06-27 MED ORDER — BUPIVACAINE IN DEXTROSE 0.75-8.25 % IT SOLN
INTRATHECAL | Status: DC | PRN
  Administered 2020-06-27: 1.6 mL via INTRATHECAL

## 2020-06-27 MED ORDER — MENTHOL 3 MG MT LOZG: 1.0000 | LOZENGE | OROMUCOSAL | Status: DC | PRN

## 2020-06-27 MED ORDER — FENTANYL CITRATE (PF) 250 MCG/5ML IJ SOLN
INTRAMUSCULAR | Status: AC
  Filled 2020-06-27: qty 5

## 2020-06-27 MED ORDER — VANCOMYCIN HCL IN DEXTROSE 1-5 GM/200ML-% IV SOLN
1000.0000 mg | Freq: Two times a day (BID) | INTRAVENOUS | Status: DC
  Filled 2020-06-27: qty 200

## 2020-06-27 MED ORDER — DOCUSATE SODIUM 100 MG PO CAPS
100.0000 mg | ORAL_CAPSULE | Freq: Two times a day (BID) | ORAL | Status: DC
  Administered 2020-06-27 – 2020-07-03 (×12): 100 mg via ORAL
  Filled 2020-06-27 (×12): qty 1

## 2020-06-27 SURGICAL SUPPLY — 40 items
ALCOHOL 70% 16 OZ (MISCELLANEOUS) ×2 IMPLANT
BIT DRILL CALIBRATED 4.2 (BIT) IMPLANT
BNDG COHESIVE 6X5 TAN STRL LF (GAUZE/BANDAGES/DRESSINGS) ×4 IMPLANT
BNDG ELASTIC 6X10 VLCR STRL LF (GAUZE/BANDAGES/DRESSINGS) ×1 IMPLANT
CANISTER SUCT 3000ML PPV (MISCELLANEOUS) ×2 IMPLANT
COVER PERINEAL POST (MISCELLANEOUS) ×2 IMPLANT
COVER SURGICAL LIGHT HANDLE (MISCELLANEOUS) ×2 IMPLANT
COVER WAND RF STERILE (DRAPES) ×2 IMPLANT
DRAPE HALF SHEET 40X57 (DRAPES) IMPLANT
DRAPE INCISE IOBAN 66X45 STRL (DRAPES) ×2 IMPLANT
DRAPE STERI IOBAN 125X83 (DRAPES) ×2 IMPLANT
DRILL BIT CALIBRATED 4.2 (BIT) ×2
DRSG ADAPTIC 3X8 NADH LF (GAUZE/BANDAGES/DRESSINGS) ×2 IMPLANT
DURAPREP 26ML APPLICATOR (WOUND CARE) ×2 IMPLANT
ELECT CAUTERY BLADE 6.4 (BLADE) ×2 IMPLANT
ELECT REM PT RETURN 9FT ADLT (ELECTROSURGICAL) ×2
ELECTRODE REM PT RTRN 9FT ADLT (ELECTROSURGICAL) ×1 IMPLANT
GAUZE SPONGE 4X4 12PLY STRL (GAUZE/BANDAGES/DRESSINGS) ×1 IMPLANT
GAUZE SPONGE 4X4 12PLY STRL LF (GAUZE/BANDAGES/DRESSINGS) ×2 IMPLANT
GLOVE BIO SURGEON STRL SZ7.5 (GLOVE) ×2 IMPLANT
GLOVE BIOGEL PI IND STRL 8 (GLOVE) ×1 IMPLANT
GLOVE BIOGEL PI INDICATOR 8 (GLOVE) ×1
GOWN STRL REUS W/ TWL LRG LVL3 (GOWN DISPOSABLE) ×1 IMPLANT
GOWN STRL REUS W/ TWL XL LVL3 (GOWN DISPOSABLE) ×1 IMPLANT
GOWN STRL REUS W/TWL LRG LVL3 (GOWN DISPOSABLE) ×1
GOWN STRL REUS W/TWL XL LVL3 (GOWN DISPOSABLE) ×1
GUIDEWIRE 3.2X400 (WIRE) ×2 IMPLANT
KIT BASIN OR (CUSTOM PROCEDURE TRAY) ×2 IMPLANT
KIT TURNOVER KIT B (KITS) ×2 IMPLANT
NAIL TROCH FIX 10X170 130 (Nail) ×1 IMPLANT
NS IRRIG 1000ML POUR BTL (IV SOLUTION) ×2 IMPLANT
PACK GENERAL/GYN (CUSTOM PROCEDURE TRAY) ×2 IMPLANT
PAD ARMBOARD 7.5X6 YLW CONV (MISCELLANEOUS) ×4 IMPLANT
SCREW CANN LOCK TI FT 5X30 (Screw) ×1 IMPLANT
SCREW FEMORAL NECK PERF 90 (Screw) ×1 IMPLANT
STAPLER VISISTAT 35W (STAPLE) ×2 IMPLANT
SUT MON AB 2-0 CT1 36 (SUTURE) ×2 IMPLANT
TOWEL GREEN STERILE (TOWEL DISPOSABLE) ×2 IMPLANT
TOWEL GREEN STERILE FF (TOWEL DISPOSABLE) ×2 IMPLANT
WATER STERILE IRR 1000ML POUR (IV SOLUTION) ×2 IMPLANT

## 2020-06-27 NOTE — Brief Op Note (Signed)
06/27/2020  4:30 PM  PATIENT:  Kristine Roberts  64 y.o. female  PRE-OPERATIVE DIAGNOSIS:  Left hip fx  POST-OPERATIVE DIAGNOSIS:  Left hip fx  PROCEDURE:  Procedure(s): INTRAMEDULLARY (IM) NAIL INTERTROCHANTRIC (Left)  SURGEON:  Surgeon(s) and Role:    * Yolonda Kida, MD - Primary  PHYSICIAN ASSISTANT:   ASSISTANTS: Lenn Cal, RNFA   ANESTHESIA:   spinal  EBL:  50 cc  BLOOD ADMINISTERED:none  DRAINS: none   LOCAL MEDICATIONS USED:  NONE  SPECIMEN:  No Specimen  DISPOSITION OF SPECIMEN:  N/A  COUNTS:  YES  TOURNIQUET:  * No tourniquets in log *  DICTATION: .Note written in EPIC  PLAN OF CARE: Admit to inpatient   PATIENT DISPOSITION:  PACU - hemodynamically stable.   Delay start of Pharmacological VTE agent (>24hrs) due to surgical blood loss or risk of bleeding: not applicable

## 2020-06-27 NOTE — Assessment & Plan Note (Addendum)
-   s/p IM fixation on 06/27/2020 with orthopedic surgery -Continue pain control postop -PT eval; currently recommending SNF placement. Patient has thought about this and now is in agreement with pursing rehab. Will discuss with SW/CM to start working on process

## 2020-06-27 NOTE — Progress Notes (Signed)
Pt is s/p surgery for hip fracture. Vanc x1 post op for prophylaxis. Due to her low weight and a CrCl~35 ml/min, the preop vanc will last for 24 hr so she will not need another dose.  Ulyses Southward, PharmD, BCIDP, AAHIVP, CPP Infectious Disease Pharmacist 06/27/2020 6:53 PM

## 2020-06-27 NOTE — Assessment & Plan Note (Addendum)
-  Patient endorses significant nausea and vomiting with almost every meal.  She also continues to smoke and drink -Continue encouraging good oral intake - follow up nutrition consult

## 2020-06-27 NOTE — Op Note (Signed)
Date of Surgery: 06/27/2020  INDICATIONS: Kristine Roberts is a 64 y.o.-year-old female who sustained a left hip fracture. The risks and benefits of the procedure discussed with the patient prior to the procedure and all questions were answered; consent was obtained.  PREOPERATIVE DIAGNOSIS: left hip fracture   POSTOPERATIVE DIAGNOSIS: Same   PROCEDURE: Treatment of intertrochanteric, pertrochanteric, subtrochanteric fracture with intramedullary implant. CPT 579 [id number]   SURGEON: Kathi Der. Aundria Rud, M.D.   ANESTHESIA: general   IV FLUIDS AND URINE: See anesthesia record   ESTIMATED BLOOD LOSS: 50 cc  IMPLANTS:  synthes TFNA 10 x 170 mm 90 mm proximal screw 30 x 5.0 m distal screw  DRAINS: None.   COMPLICATIONS: None.   DESCRIPTION OF PROCEDURE: The patient was brought to the operating room and placed supine on the operating table. The patient's leg had been signed prior to the procedure. The patient had the anesthesia placed by the anesthesiologist. The prep verification and incision time-outs were performed to confirm that this was the correct patient, site, side and location. The patient had an SCD on the opposite lower extremity. The patient did receive antibiotics prior to the incision and was re-dosed during the procedure as needed at indicated intervals. The patient was positioned on the fracture table with the table in traction and internal rotation to reduce the hip. The well leg was placed in a scissor position and all bony prominences were well-padded. The patient had the lower extremity prepped and draped in the standard surgical fashion. The incision was made 4 finger breadths superior to the greater trochanter. A guide pin was inserted into the tip of the greater trochanter under fluoroscopic guidance. An opening reamer was used to gain access to the femoral canal. The nail length was measured and inserted down the femoral canal to its proper depth. The appropriate version of  insertion for the lag screw was found under fluoroscopy. A pin was inserted up the femoral neck through the jig. The length of the lag screw was then measured. The lag screw was inserted as near to center-center in the head as possible. The leg was taken out of traction, then the compression screw was used to compress across the fracture. Compression was visualized on serial xrays.   We next turned our attention to the distal interlocking screw.  This was placed through the drill guide of the nail inserter.  A small incision was made overlying the lateral thigh at the screw site, and a tonsil was used to disect down to bone.  A drill pass was made through the jig and across the nail through both cortices.  This was measured, and the appropriate screw was placed under hand power and found to have good bite.    The wound was copiously irrigated with saline and the subcutaneous layer closed with 2.0 vicryl and the skin was reapproximated with staples. The wounds were cleaned and dried a final time and a sterile dressing was placed. The hip was taken through a range of motion at the end of the case under fluoroscopic imaging to visualize the approach-withdraw phenomenon and confirm implant length in the head. The patient was then awakened from anesthesia and taken to the recovery room in stable condition. All counts were correct at the end of the case.   POSTOPERATIVE PLAN: The patient will be weight bearing as tolerated and will return in 2 weeks for staple removal and the patient will receive DVT prophylaxis based on other medications, activity level, and  risk ratio of bleeding to thrombosis.     Maryan Rued, MD [facility] [place] [phone] 4:34 PM

## 2020-06-27 NOTE — Anesthesia Procedure Notes (Signed)
Spinal  Patient location during procedure: OR Start time: 06/27/2020 3:13 PM End time: 06/27/2020 3:19 PM Staffing Performed: anesthesiologist  Anesthesiologist: Mal Amabile, MD Preanesthetic Checklist Completed: patient identified, IV checked, site marked, risks and benefits discussed, surgical consent, monitors and equipment checked, pre-op evaluation and timeout performed Spinal Block Patient position: left lateral decubitus Prep: DuraPrep and site prepped and draped Patient monitoring: heart rate, cardiac monitor, continuous pulse ox and blood pressure Approach: midline Location: L3-4 Injection technique: single-shot Needle Needle type: Pencan  Needle gauge: 24 G Needle length: 9 cm Needle insertion depth: 4 cm Assessment Sensory level: T4 Additional Notes Patient tolerated procedure well. Adequate sensory level.

## 2020-06-27 NOTE — Anesthesia Preprocedure Evaluation (Signed)
Anesthesia Evaluation  Patient identified by MRN, date of birth, ID band Patient awake    Reviewed: Allergy & Precautions, NPO status , Patient's Chart, lab work & pertinent test results  Airway Mallampati: II  TM Distance: >3 FB Neck ROM: Full    Dental  (+) Edentulous Upper, Edentulous Lower   Pulmonary pneumonia, unresolved, COPD,  COPD inhaler, Current Smoker,     + decreased breath sounds  rales    Cardiovascular Normal cardiovascular exam+ dysrhythmias  Rhythm:Regular Rate:Normal  EKG 06/26/20 NSR, probable atypical RBBB pattern vs IVCD   Neuro/Psych PSYCHIATRIC DISORDERS    GI/Hepatic hiatal hernia, PUD, GERD  Medicated and Controlled,(+)     substance abuse  alcohol use, Hx/o ETOH abuse Hx/o Gastric outlet obstruction   Endo/Other  Malnutrition  Renal/GU negative Renal ROS  negative genitourinary   Musculoskeletal Fx left hip   Abdominal   Peds  Hematology  (+) anemia ,   Anesthesia Other Findings   Reproductive/Obstetrics                             Anesthesia Physical Anesthesia Plan  ASA: III  Anesthesia Plan: Spinal   Post-op Pain Management:    Induction:   PONV Risk Score and Plan: Propofol infusion, Ondansetron and Treatment may vary due to age or medical condition  Airway Management Planned: Natural Airway and Nasal Cannula  Additional Equipment:   Intra-op Plan:   Post-operative Plan:   Informed Consent: I have reviewed the patients History and Physical, chart, labs and discussed the procedure including the risks, benefits and alternatives for the proposed anesthesia with the patient or authorized representative who has indicated his/her understanding and acceptance.       Plan Discussed with: CRNA and Anesthesiologist  Anesthesia Plan Comments:         Anesthesia Quick Evaluation

## 2020-06-27 NOTE — Assessment & Plan Note (Signed)
-  Continue PPI.  Informed her that her drinking and smoking also does not help

## 2020-06-27 NOTE — Anesthesia Procedure Notes (Signed)
Procedure Name: MAC Date/Time: 06/27/2020 3:25 PM Performed by: Griffin Dakin, CRNA Pre-anesthesia Checklist: Patient identified, Emergency Drugs available, Suction available and Patient being monitored Patient Re-evaluated:Patient Re-evaluated prior to induction Oxygen Delivery Method: Simple face mask Induction Type: IV induction Number of attempts: 1 Placement Confirmation: positive ETCO2 and breath sounds checked- equal and bilateral Dental Injury: Teeth and Oropharynx as per pre-operative assessment

## 2020-06-27 NOTE — Progress Notes (Signed)
PROGRESS NOTE    Kristine Roberts   GBT:[id number]  DOB: 01-16-1956  DOA: 06/25/2020     2  PCP: [facility]  CC: fall at home  Hospital Course: Kristine Roberts is a 64 y.o. female with PMH significant of COPD, continues to smoke, continues to use alcohol, last drink 1 day prior to admission, history of peptic ulcer disease, severe malnutrition, osteoporosis. Patient presented to the ED on 7/21 with complaint of cough, dyspnea, excessive phlegm and occasional fever, gradually worsening for 2 weeks. The night previous, patient got up to go to bathroom, legs gave out, fell to floor. Her boyfriend helped her back to bed but next morning she had generalized weakness and was unable to get out of bed. In general, she endorses chronic nausea and vomiting with almost every meal she eats.  She attributes her frailty and progressive weight loss due to being unable to keep adequate nutrition down.   In the ED, patient was afebrile, hemodynamically stable, required 2 L oxygen by nasal cannula.  Labs with WBC 14.1. Left hip x-ray showed left intertrochanteric hip fracture. CXR suggestive of left upper lobe pneumonia. ECG with irregular sinus with RBBB - chronic and unchanged since at least 2019.  She underwent IM fixation in the OR on 7/23 and tolerated well.    Interval History:  Seen in her room after surgery. Her significant other was bedside also.  She was slightly hungry and wanting to try to eat.  We reviewed her medical history and social history.  She confirmed that she does continue to smoke and drink at home and essentially vomit most all meals that she attempts.  Her significant other states that she always has a difficult time keeping food down and they are unsure what to do to help her. Her left hip was appropriately sore having just come up from PACU after surgery.  She was otherwise resting in bed comfortably.  Old records reviewed in assessment of this  patient  ROS: Constitutional: negative for chills and fevers, Respiratory: positive for cough, Cardiovascular: negative for chest pain and Gastrointestinal: negative for abdominal pain  Assessment & Plan: COPD (chronic obstructive pulmonary disease) (HCC) -Continues to smoke.  Cessation counseling given bedside  Protein-calorie malnutrition, severe -Patient endorses significant nausea and vomiting with almost every meal.  She also continues to smoke and drink -Continue encouraging good oral intake  ETOH abuse -Ongoing alcohol use at home -Seen after surgery, no signs or symptoms of alcohol withdrawal, but needs to be continued to be monitored; CIWA protocol ordered  Gastric ulcer -Continue PPI.  Informed her that her drinking and smoking also does not help  Closed left hip fracture, initial encounter [facility]) - s/p IM fixation on 06/27/2020 with orthopedic surgery -Continue pain control postop -PT eval  Community acquired pneumonia of left upper lobe of lung Continue Rocephin and azithromycin   Antimicrobials: Rocephin 06/26/2020>> present Azithromycin 06/26/2020>> present  DVT prophylaxis: SCD, will resume Lovenox tomorrow Code Status: Full Family Communication: Significant other bedside Disposition Plan:  Status is: Inpatient  Remains inpatient appropriate because:Ongoing active pain requiring inpatient pain management, IV treatments appropriate due to intensity of illness or inability to take PO and Inpatient level of care appropriate due to severity of illness   Dispo: The patient is from: Home              Anticipated d/c is to: Pending PT eval  Anticipated d/c date is: 2 days              Patient currently is not medically stable to d/c.       Objective: Blood pressure 123/73, pulse 73, temperature 97.6 F (36.4 C), temperature source Oral, resp. rate 16, height 5\' 4"  (1.626 m), weight (!) 31.5 kg, SpO2 100 %.  Examination: General appearance:  Severely cachectic appearing adult woman also appearing older than stated age lying in bed in no distress with significant other bedside and audible congested cough Head: Normocephalic, without obvious abnormality, atraumatic Eyes: EOMI Lungs: Diffuse coarse breath sounds bilaterally with no significant wheezing appreciated Heart: regular rate and rhythm and S1, S2 normal Abdomen: Thin, soft, nontender, nondistended, bowel sounds present Extremities: Thin, no edema.  Surgical dressings in place over right hip.  Compartments soft.  No oozing or weeping around dressing sites Skin: Warm, dry, intact Neurologic: Grossly normal   Consultants:   Orthopedic surgery  Procedures:   IM nail fixation, left hip, 06/27/2020  Data Reviewed: I have personally reviewed following labs and imaging studies Results for orders placed or performed during the hospital encounter of 06/25/20 (from the past 24 hour(s))  Surgical pcr screen     Status: None   Collection Time: 06/27/20  3:50 AM   Specimen: Nasal Mucosa; Nasal Swab  Result Value Ref Range   MRSA, PCR NEGATIVE NEGATIVE   Staphylococcus aureus NEGATIVE NEGATIVE  Basic metabolic panel     Status: Abnormal   Collection Time: 06/27/20  5:08 AM  Result Value Ref Range   Sodium 141 135 - 145 mmol/L   Potassium 4.3 3.5 - 5.1 mmol/L   Chloride 106 98 - 111 mmol/L   CO2 25 22 - 32 mmol/L   Glucose, Bld 81 70 - 99 mg/dL   BUN 14 8 - 23 mg/dL   Creatinine, Ser 4.25 0.44 - 1.00 mg/dL   Calcium 8.5 (L) 8.9 - 10.3 mg/dL   GFR calc non Af Amer >60 >60 mL/min   GFR calc Af Amer >60 >60 mL/min   Anion gap 10 5 - 15  CBC with Differential/Platelet     Status: Abnormal   Collection Time: 06/27/20  5:08 AM  Result Value Ref Range   WBC 9.6 4.0 - 10.5 K/uL   RBC 3.47 (L) 3.87 - 5.11 MIL/uL   Hemoglobin 10.2 (L) 12.0 - 15.0 g/dL   HCT 95.6 (L) 36 - 46 %   MCV 94.8 80.0 - 100.0 fL   MCH 29.4 26.0 - 34.0 pg   MCHC 31.0 30.0 - 36.0 g/dL   RDW 38.7 (H)  56.4 - 15.5 %   Platelets 290 150 - 400 K/uL   nRBC 0.0 0.0 - 0.2 %   Neutrophils Relative % 70 %   Neutro Abs 6.7 1.7 - 7.7 K/uL   Lymphocytes Relative 14 %   Lymphs Abs 1.3 0.7 - 4.0 K/uL   Monocytes Relative 13 %   Monocytes Absolute 1.3 (H) 0 - 1 K/uL   Eosinophils Relative 2 %   Eosinophils Absolute 0.2 0 - 0 K/uL   Basophils Relative 1 %   Basophils Absolute 0.1 0 - 0 K/uL   Immature Granulocytes 0 %   Abs Immature Granulocytes 0.04 0.00 - 0.07 K/uL    Recent Results (from the past 240 hour(s))  SARS Coronavirus 2 by RT PCR (hospital order, performed in [facility] hospital lab) Nasopharyngeal Nasopharyngeal Swab     Status: None  Collection Time: 06/25/20  2:41 PM   Specimen: Nasopharyngeal Swab  Result Value Ref Range Status   SARS Coronavirus 2 NEGATIVE NEGATIVE Final    Comment: (NOTE) SARS-CoV-2 target nucleic acids are NOT DETECTED.  The SARS-CoV-2 RNA is generally detectable in upper and lower respiratory specimens during the acute phase of infection. The lowest concentration of SARS-CoV-2 viral copies this assay can detect is 250 copies / mL. A negative result does not preclude SARS-CoV-2 infection and should not be used as the sole basis for treatment or other patient management decisions.  A negative result may occur with improper specimen collection / handling, submission of specimen other than nasopharyngeal swab, presence of viral mutation(s) within the areas targeted by this assay, and inadequate number of viral copies (<250 copies / mL). A negative result must be combined with clinical observations, patient history, and epidemiological information.  Fact Sheet for Patients:   BoilerBrush.com.cy  Fact Sheet for Healthcare Providers: https://pope.com/  This test is not yet approved or  cleared by the [place] FDA and has been authorized for detection and/or diagnosis of SARS-CoV-2 by FDA under an  Emergency Use Authorization (EUA).  This EUA will remain in effect (meaning this test can be used) for the duration of the COVID-19 declaration under Section 564(b)(1) of the Act, 21 U.S.C. section 360bbb-3(b)(1), unless the authorization is terminated or revoked sooner.  Performed at [facility], [address], [place], [place] [address]   Culture, blood (routine x 2) Call MD if unable to obtain prior to antibiotics being given     Status: None (Preliminary result)   Collection Time: 06/25/20  8:22 PM   Specimen: BLOOD  Result Value Ref Range Status   Specimen Description BLOOD SITE NOT SPECIFIED  Final   Special Requests   Final    BOTTLES DRAWN AEROBIC AND ANAEROBIC Blood Culture adequate volume   Culture   Final    NO GROWTH 2 DAYS Performed at [facility], [address], [place], [place] [address]    Report Status PENDING  Incomplete  Culture, blood (routine x 2) Call MD if unable to obtain prior to antibiotics being given     Status: None (Preliminary result)   Collection Time: 06/25/20  8:23 PM   Specimen: BLOOD RIGHT WRIST  Result Value Ref Range Status   Specimen Description BLOOD RIGHT WRIST  Final   Special Requests   Final    BOTTLES DRAWN AEROBIC AND ANAEROBIC Blood Culture adequate volume   Culture   Final    NO GROWTH 2 DAYS Performed at [facility], [address] [place], [place] [address]    Report Status PENDING  Incomplete  Surgical pcr screen     Status: None   Collection Time: 06/27/20  3:50 AM   Specimen: Nasal Mucosa; Nasal Swab  Result Value Ref Range Status   MRSA, PCR NEGATIVE NEGATIVE Final   Staphylococcus aureus NEGATIVE NEGATIVE Final    Comment: (NOTE) The Xpert SA Assay (FDA approved for NASAL specimens in patients 54 years of age and older), is one component of a comprehensive surveillance program. It is not intended to diagnose infection nor to guide or monitor treatment. Performed at [facility], [address], [place], [place] [address]      Radiology Studies: No results found. DG Hip Unilat With Pelvis 2-3 Views Left  Final Result    DG Chest 1 View  Final Result  DG C-Arm 1-60 Min    (Results Pending)  DG HIP OPERATIVE UNILAT WITH PELVIS LEFT    (Results Pending)     Scheduled Meds: . [START ON 06/28/2020] aspirin EC  325 mg Oral Q breakfast  . docusate sodium  100 mg Oral BID  . feeding supplement (ENSURE ENLIVE)  237 mL Oral TID BM  . feeding supplement  296 mL Oral Once  . folic acid  1 mg Oral Daily  . mometasone-formoterol  2 puff Inhalation BID  . multivitamin with minerals  1 tablet Oral Daily  . nicotine  21 mg Transdermal Daily  . pantoprazole  40 mg Oral BID  . povidone-iodine  2 application Topical Once  . thiamine  100 mg Oral Daily   Or  . thiamine  100 mg Intravenous Daily   PRN Meds: albuterol, cyclobenzaprine, fentaNYL (SUBLIMAZE) injection, HYDROcodone-acetaminophen, LORazepam **OR** LORazepam, menthol-cetylpyridinium **OR** phenol, metoCLOPramide **OR** metoCLOPramide (REGLAN) injection, naphazoline-glycerin, ondansetron **OR** ondansetron (ZOFRAN) IV Continuous Infusions: . azithromycin Stopped (06/26/20 1934)  . cefTRIAXone (ROCEPHIN)  IV Stopped (06/26/20 1900)  . lactated ringers 10 mL/hr at 06/27/20 1507      LOS: 2 days  Time spent: Greater than 50% of the 35 minute visit was spent in counseling/coordination of care for the patient as laid out in the A&P.   Lewie Chamber, MD [facility] 06/27/2020, 6:57 PM   Contact via secure chat.  To contact the attending provider between 7A-7P or the covering provider during after hours 7P-7A, please log into the web site [email or web address] and access using universal [place] password for that web site. If you do not have the password, please call the hospital operator.

## 2020-06-27 NOTE — Progress Notes (Signed)
Initial Nutrition Assessment  DOCUMENTATION CODES:   Underweight, Severe malnutrition in context of chronic illness  INTERVENTION:   -D/c Prostat Once diet advanced, add: -Ensure Enlive po TID, each supplement provides 350 kcal and 20 grams of protein -MVI with minerals daily  NUTRITION DIAGNOSIS:   Severe Malnutrition related to chronic illness (COPD) as evidenced by moderate fat depletion, severe fat depletion, severe muscle depletion, energy intake < or equal to 75% for > or equal to 1 month.  GOAL:   Patient will meet greater than or equal to 90% of their needs  MONITOR:   PO intake, Supplement acceptance, Diet advancement, Labs, Weight trends, Skin, I & O's  REASON FOR ASSESSMENT:   Consult Assessment of nutrition requirement/status, Hip fracture protocol  ASSESSMENT:   Kristine Roberts is a 64 y.o. female with medical history significant of COPD not on home O2 but still smoking, ongoing EtOH abuse, last drink was yesterday, PUD, severe malnutrition, osteoporosis.  Pt admitted with closed lt hip fracture s/p fall.   Reviewed I/O's: +667 ml x 24 hours  Case discussed with RN, who reports pt is scheduled for surgery later on today and is currently NPO.  Spoke with pt and boyfriend at bedside. Pt reports she has ongoing struggles with acid reflux and esophageal stricture. She reports he last had her esophagus dilated about 2 years ago, but she continues to use goody powder despite urging from GI not to. Per pt, she tries to consumes 3 meals per day but has difficulty keeping foods and liquids down. She reports consuming 3 Ensure per day which she is able to keep down.   Pt endorses wt loss, estimates she has lost about 10 pounds over the past month. Per pt, UBW is around 70 pounds.    Discussed with pt importance of good meal and supplement intake to promote healing. She is eager to eat after surgery. She is amenable to Ensure supplements.    Labs reviewed: K: 3.2.    NUTRITION - FOCUSED PHYSICAL EXAM:    Most Recent Value  Orbital Region Moderate depletion  Upper Arm Region Severe depletion  Thoracic and Lumbar Region Severe depletion  Buccal Region Severe depletion  Temple Region Severe depletion  Clavicle Bone Region Severe depletion  Clavicle and Acromion Bone Region Severe depletion  Scapular Bone Region Severe depletion  Dorsal Hand Severe depletion  Patellar Region Severe depletion  Anterior Thigh Region Severe depletion  Posterior Calf Region Severe depletion  Edema (RD Assessment) None  Hair Reviewed  Eyes Reviewed  Mouth Reviewed  Skin Reviewed  Nails Reviewed       Diet Order:   Diet Order            Diet NPO time specified  Diet effective midnight                 EDUCATION NEEDS:   Education needs have been addressed  Skin:  Skin Assessment: Reviewed RN Assessment  Last BM:  Unknown  Height:   Ht Readings from Last 1 Encounters:  08/24/19 5' (1.524 m)    Weight:   Wt Readings from Last 1 Encounters:  08/30/19 38.4 kg    Ideal Body Weight:  45.5 kg  BMI:  There is no height or weight on file to calculate BMI.  Estimated Nutritional Needs:   Kcal:  1650-1850  Protein:  80-95 grams  Fluid:  > 1.6 L    Levada Schilling, RD, LDN, CDCES Registered Dietitian II Certified Diabetes Care  and Education Specialist Please refer to [facility] for RD and/or RD on-call/weekend/after hours pager

## 2020-06-27 NOTE — H&P (Signed)
H&P update  The surgical history has been reviewed and remains accurate without interval change.  The patient was re-examined and patient's physiologic condition has not changed significantly in the last 30 days. The condition still exists that makes this procedure necessary. The treatment plan remains the same, without new options for care.  No new pharmacological allergies or types of therapy has been initiated that would change the plan or the appropriateness of the plan.  The patient and/or family understand the potential benefits and risks.  Cranford Blessinger P. Aundria Rud, MD 06/27/2020 2:38 PM

## 2020-06-27 NOTE — Transfer of Care (Signed)
Immediate Anesthesia Transfer of Care Note  Patient: Kristine Roberts  Procedure(s) Performed: INTRAMEDULLARY (IM) NAIL INTERTROCHANTRIC (Left )  Patient Location: PACU  Anesthesia Type:MAC and Spinal  Level of Consciousness: awake, alert  and oriented  Airway & Oxygen Therapy: Patient Spontanous Breathing  Post-op Assessment: Report given to RN and Post -op Vital signs reviewed and stable  Post vital signs: Reviewed and stable  Last Vitals:  Vitals Value Taken Time  BP 92/60 06/27/20 1639  Temp    Pulse    Resp 18 06/27/20 1643  SpO2    Vitals shown include unvalidated device data.  Last Pain:  Vitals:   06/27/20 0748  TempSrc: Oral  PainSc:       Patients Stated Pain Goal: 2 (01/77/93 9030)  Complications: No complications documented.

## 2020-06-27 NOTE — Hospital Course (Addendum)
Kristine Roberts is a 64 y.o. female with PMH significant of COPD, continues to smoke, continues to use alcohol, last drink 1 day prior to admission, history of peptic ulcer disease, severe malnutrition, osteoporosis. Patient presented to the ED on 7/21 with complaint of cough, dyspnea, excessive phlegm and occasional fever, gradually worsening for 2 weeks. The night previous, patient got up to go to bathroom, legs gave out, fell to floor. Her boyfriend helped her back to bed but next morning she had generalized weakness and was unable to get out of bed. In general, she endorses chronic nausea and vomiting with almost every meal she eats.  She attributes her frailty and progressive weight loss due to being unable to keep adequate nutrition down.   In the ED, patient was afebrile, hemodynamically stable, required 2 L oxygen by nasal cannula.  Labs with WBC 14.1. Left hip x-ray showed left intertrochanteric hip fracture. CXR suggestive of left upper lobe pneumonia.  She was started on abx for PNA coverage and responded well.   She underwent IM fixation in the OR on 7/23 and tolerated well.  She started working with PT on 7/24 with initial assessments recommending SNF. Patient also is in agreement with rehab after thinking about this recommendation while she has been recovering.

## 2020-06-27 NOTE — Assessment & Plan Note (Signed)
-  Continues to smoke.  Cessation counseling given bedside

## 2020-06-27 NOTE — Assessment & Plan Note (Addendum)
-  Ongoing alcohol use at home -Seen after surgery, no signs or symptoms of alcohol withdrawal. She was initially monitored on CIWA protocol but did not exhibit any signs of withdrawal therefore was discontinued from CIWA.

## 2020-06-27 NOTE — Assessment & Plan Note (Addendum)
Continue Rocephin and azithromycin - clinically responding well; plan to complete 5 day course

## 2020-06-27 NOTE — Progress Notes (Addendum)
1403 Pt is A&O x3, calm and cooperative. CHG bath done, NPO post midnight maint. Pt to short stay. 1800 Pt received from PACU, A&O x4. Left hip dressing dry and intact. Comfortable right now.

## 2020-06-27 NOTE — Plan of Care (Addendum)
Pt alert, yet sleepy when arrived to floor. Pt states she wants to leave and not do surgery. After talking to patient and educating patient agreed to stay until the doctors rounded in the morning. Pt pain was under control throughout the night. Pt stable, VS good will continue to monitor.   Problem: Education: Goal: Knowledge of General Education information will improve Description: Including pain rating scale, medication(s)/side effects and non-pharmacologic comfort measures Outcome: Progressing   Problem: Activity: Goal: Risk for activity intolerance will decrease Outcome: Progressing   Problem: Pain Managment: Goal: General experience of comfort will improve Outcome: Progressing   Problem: Safety: Goal: Ability to remain free from injury will improve Outcome: Progressing   Problem: Skin Integrity: Goal: Risk for impaired skin integrity will decrease Outcome: Progressing

## 2020-06-28 DIAGNOSIS — S72002A Fracture of unspecified part of neck of left femur, initial encounter for closed fracture: Secondary | ICD-10-CM | POA: Diagnosis not present

## 2020-06-28 LAB — BASIC METABOLIC PANEL
Anion gap: 9 (ref 5–15)
BUN: 12 mg/dL (ref 8–23)
CO2: 23 mmol/L (ref 22–32)
Calcium: 8.3 mg/dL — ABNORMAL LOW (ref 8.9–10.3)
Chloride: 108 mmol/L (ref 98–111)
Creatinine, Ser: 0.32 mg/dL — ABNORMAL LOW (ref 0.44–1.00)
GFR calc Af Amer: >60 mL/min (ref >60)
GFR calc non Af Amer: >60 mL/min (ref >60)
Glucose, Bld: 109 mg/dL — ABNORMAL HIGH (ref 70–99)
Potassium: 4.4 mmol/L (ref 3.5–5.1)
Sodium: 140 mmol/L (ref 135–145)

## 2020-06-28 LAB — CBC WITH DIFFERENTIAL/PLATELET
Abs Immature Granulocytes: 0.04 10*3/uL (ref 0.00–0.07)
Basophils Absolute: 0.1 10*3/uL (ref 0.0–0.1)
Basophils Relative: 1 %
Eosinophils Absolute: 0.3 10*3/uL (ref 0.0–0.5)
Eosinophils Relative: 3 %
HCT: 29.3 % — ABNORMAL LOW (ref 36.0–46.0)
Hemoglobin: 9.1 g/dL — ABNORMAL LOW (ref 12.0–15.0)
Immature Granulocytes: 1 %
Lymphocytes Relative: 14 %
Lymphs Abs: 1.1 10*3/uL (ref 0.7–4.0)
MCH: 30.1 pg (ref 26.0–34.0)
MCHC: 31.1 g/dL (ref 30.0–36.0)
MCV: 97 fL (ref 80.0–100.0)
Monocytes Absolute: 1.3 10*3/uL — ABNORMAL HIGH (ref 0.1–1.0)
Monocytes Relative: 16 %
Neutro Abs: 5.4 10*3/uL (ref 1.7–7.7)
Neutrophils Relative %: 65 %
Platelets: 243 10*3/uL (ref 150–400)
RBC: 3.02 MIL/uL — ABNORMAL LOW (ref 3.87–5.11)
RDW: 16.6 % — ABNORMAL HIGH (ref 11.5–15.5)
WBC: 8.2 10*3/uL (ref 4.0–10.5)
nRBC: 0 % (ref 0.0–0.2)

## 2020-06-28 LAB — PHOSPHORUS: Phosphorus: 2.9 mg/dL (ref 2.5–4.6)

## 2020-06-28 LAB — MAGNESIUM: Magnesium: 1.5 mg/dL — ABNORMAL LOW (ref 1.7–2.4)

## 2020-06-28 MED ORDER — OXYCODONE HCL 5 MG PO TABS
5.0000 mg | ORAL_TABLET | ORAL | Status: DC | PRN
  Administered 2020-06-28 – 2020-07-03 (×23): 5 mg via ORAL
  Filled 2020-06-28 (×22): qty 1

## 2020-06-28 MED ORDER — ENOXAPARIN SODIUM 30 MG/0.3ML ~~LOC~~ SOLN
30.0000 mg | SUBCUTANEOUS | Status: DC
  Administered 2020-06-28 – 2020-07-02 (×5): 30 mg via SUBCUTANEOUS
  Filled 2020-06-28 (×5): qty 0.3

## 2020-06-28 MED ORDER — ACETAMINOPHEN 500 MG PO TABS
1000.0000 mg | ORAL_TABLET | Freq: Three times a day (TID) | ORAL | Status: DC
  Administered 2020-06-28 – 2020-07-03 (×15): 1000 mg via ORAL
  Filled 2020-06-28 (×15): qty 2

## 2020-06-28 NOTE — Anesthesia Postprocedure Evaluation (Signed)
Anesthesia Post Note  Patient: Kristine Roberts  Procedure(s) Performed: INTRAMEDULLARY (IM) NAIL INTERTROCHANTRIC (Left )     Patient location during evaluation: PACU Anesthesia Type: Spinal Level of consciousness: awake and alert Pain management: pain level controlled Vital Signs Assessment: post-procedure vital signs reviewed and stable Respiratory status: spontaneous breathing, nonlabored ventilation, respiratory function stable and patient connected to nasal cannula oxygen Cardiovascular status: blood pressure returned to baseline and stable Postop Assessment: no apparent nausea or vomiting Anesthetic complications: no   No complications documented.  Last Vitals:  Vitals:   06/28/20 0828 06/28/20 0841  BP:  120/75  Pulse:  82  Resp:    Temp:  (!) 35.3 C  SpO2: 95% 100%    Last Pain:  Vitals:   06/28/20 0841  TempSrc: Axillary  PainSc:                  Derek Huneycutt DAVID

## 2020-06-28 NOTE — Evaluation (Signed)
Physical Therapy Evaluation Patient Details Name: Kristine Roberts MRN: [id number] DOB: 05-Aug-1956 Today's Date: 06/28/2020   History of Present Illness  Kristine Roberts is a 64 y.o. female s/p fall at home with l hip fx and underwent IM nail on 7/23.  PMH significant for but not minited to COPD not on home O2 but still smoking, EtOH abuse, PUD, severe malnutrition, osteoporosis and r hip fx.  Clinical Impression  Patient is s/p above surgery resulting in functional limitations due to the deficits listed below (see PT Problem List). Patient will benefit from skilled PT to increase their independence and safety with mobility to allow discharge to the venue listed below.  Pt was reluctant to participate with PT today but did agree to sit EOB. She would like to DC straight home but I do not know if that will be reasonable.  Will continue to follow and progress mobility as able.        Follow Up Recommendations SNF;Supervision for mobility/OOB (Pt would prefer home DC if possible)    Equipment Recommendations  None recommended by PT    Recommendations for Other Services OT consult     Precautions / Restrictions Precautions Precautions: Fall Restrictions Weight Bearing Restrictions: Yes LLE Weight Bearing: Weight bearing as tolerated      Mobility  Bed Mobility Overal bed mobility: Needs Assistance Bed Mobility: Supine to Sit;Sit to Supine     Supine to sit: Max assist;+2 for physical assistance Sit to supine: Max assist;+2 for physical assistance      Transfers Overall transfer level:  (Pt would not attempt today)  Pt sat EOB for about 7 minutes before requesting to return to supine                   Ambulation/Gait                Stairs            Wheelchair Mobility    Modified Rankin (Stroke Patients Only)       Balance                                             Pertinent Vitals/Pain Pain Assessment: 0-10 Pain Score: 8   Pain Location: left mid thigh Pain Descriptors / Indicators: Constant Pain Intervention(s): Limited activity within patient's tolerance;Monitored during session    Home Living Family/patient expects to be discharged to:: Private residence Living Arrangements: Spouse/significant other Available Help at Discharge: Other (Comment) (boyfriend Trey Paula) Type of Home: House Home Access: Stairs to enter Entrance Stairs-Rails: Right;Left;Can reach both Entrance Stairs-Number of Steps: 3 Home Layout: One level Home Equipment: Walker - 2 wheels;Cane - single point;Bedside commode      Prior Function Level of Independence: Independent         Comments: reports she was not using any device to ambulate recently     Hand Dominance   Dominant Hand: Right    Extremity/Trunk Assessment   Upper Extremity Assessment Upper Extremity Assessment: Defer to OT evaluation    Lower Extremity Assessment Lower Extremity Assessment: LLE deficits/detail LLE Deficits / Details: significantly limited strength and ROM throughout due to pain LLE: Unable to fully assess due to pain    Cervical / Trunk Assessment Cervical / Trunk Assessment: Kyphotic  Communication   Communication: No difficulties  Cognition Arousal/Alertness: Awake/alert Behavior During Therapy: [facility] for tasks  assessed/performed Overall Cognitive Status: Within Functional Limits for tasks assessed                                        General Comments General comments (skin integrity, edema, etc.): No family present    Exercises Total Joint Exercises Ankle Circles/Pumps: AROM;Both;5 reps;Supine   Assessment/Plan    PT Assessment Patient needs continued PT services  PT Problem List Decreased strength;Decreased range of motion;Decreased activity tolerance;Decreased mobility;Pain       PT Treatment Interventions DME instruction;Gait training;Stair training;Therapeutic exercise;Patient/family education    PT  Goals (Current goals can be found in the Care Plan section)  Acute Rehab PT Goals Patient Stated Goal: to get OOB tomorrow PT Goal Formulation: With patient Time For Goal Achievement: 07/05/20 Potential to Achieve Goals: Good    Frequency Min 4X/week   Barriers to discharge        Co-evaluation               AM-PAC PT "6 Clicks" Mobility  Outcome Measure Help needed turning from your back to your side while in a flat bed without using bedrails?: A Lot Help needed moving from lying on your back to sitting on the side of a flat bed without using bedrails?: A Lot Help needed moving to and from a bed to a chair (including a wheelchair)?: A Lot Help needed standing up from a chair using your arms (e.g., wheelchair or bedside chair)?: A Lot Help needed to walk in hospital room?: A Lot Help needed climbing 3-5 steps with a railing? : Total 6 Click Score: 11    End of Session Equipment Utilized During Treatment: Oxygen Activity Tolerance: Patient limited by pain Patient left: in bed;with bed alarm set;with call bell/phone within reach   PT Visit Diagnosis: Other abnormalities of gait and mobility (R26.89)    Time: 0539-7673 PT Time Calculation (min) (ACUTE ONLY): 20 min   Charges:   PT Evaluation $PT Eval Moderate Complexity: 1 Mod          Lavona Mound, PT   Acute Rehabilitation Services  Pager [phone] Office [phone] 06/28/2020   Donnella Sham 06/28/2020, 3:06 PM

## 2020-06-28 NOTE — Progress Notes (Signed)
PT Cancellation Note  Patient Details Name: Makaiah Terwilliger MRN: [id number] DOB: 1956-07-22   Cancelled Treatment:    Reason Eval/Treat Not Completed: Active bedrest order. Need increased activity orders, if appropriate, prior to evaluating.    Donnella Sham 06/28/2020, 11:10 AM   Lavona Mound, PT   Acute Rehabilitation Services  Pager [phone] Office [phone] 06/28/2020

## 2020-06-28 NOTE — Progress Notes (Signed)
PROGRESS NOTE    Kristine Roberts   HCW:[id number]  DOB: 09/13/1956  DOA: 06/25/2020     3  PCP: [facility]  CC: fall at home  Hospital Course: Kristine Roberts is a 64 y.o. female with PMH significant of COPD, continues to smoke, continues to use alcohol, last drink 1 day prior to admission, history of peptic ulcer disease, severe malnutrition, osteoporosis. Patient presented to the ED on 7/21 with complaint of cough, dyspnea, excessive phlegm and occasional fever, gradually worsening for 2 weeks. The night previous, patient got up to go to bathroom, legs gave out, fell to floor. Her boyfriend helped her back to bed but next morning she had generalized weakness and was unable to get out of bed. In general, she endorses chronic nausea and vomiting with almost every meal she eats.  She attributes her frailty and progressive weight loss due to being unable to keep adequate nutrition down.   In the ED, patient was afebrile, hemodynamically stable, required 2 L oxygen by nasal cannula.  Labs with WBC 14.1. Left hip x-ray showed left intertrochanteric hip fracture. CXR suggestive of left upper lobe pneumonia. ECG with irregular sinus with RBBB - chronic and unchanged since at least 2019.  She underwent IM fixation in the OR on 7/23 and tolerated well.  She started working with PT on 7/24 with initial assessments recommending SNF but patient does wish to get to go home from hospital if possible and able to regain enough mobility/independence back in time.    Interval History:  No events overnight. Sleepy this am but arouses easily. Husband arrived bedside during our conversation and was also updated.   Old records reviewed in assessment of this patient  ROS: Constitutional: negative for chills and fevers, Respiratory: positive for cough, Cardiovascular: negative for chest pain and Gastrointestinal: negative for abdominal pain  Assessment & Plan: COPD (chronic  obstructive pulmonary disease) (HCC) -Continues to smoke.  Cessation counseling given bedside  Protein-calorie malnutrition, severe -Patient endorses significant nausea and vomiting with almost every meal.  She also continues to smoke and drink -Continue encouraging good oral intake  ETOH abuse -Ongoing alcohol use at home -Seen after surgery, no signs or symptoms of alcohol withdrawal, but needs to be continued to be monitored; CIWA protocol ordered  Gastric ulcer -Continue PPI.  Informed her that her drinking and smoking also does not help  Closed left hip fracture, initial encounter [facility]) - s/p IM fixation on 06/27/2020 with orthopedic surgery -Continue pain control postop -PT eval  Community acquired pneumonia of left upper lobe of lung Continue Rocephin and azithromycin   Antimicrobials: Rocephin 06/26/2020>> present Azithromycin 06/26/2020>> present  DVT prophylaxis: Lovenox Code Status: Full Family Communication: Significant other bedside Disposition Plan:  Status is: Inpatient  Remains inpatient appropriate because:Ongoing active pain requiring inpatient pain management, IV treatments appropriate due to intensity of illness or inability to take PO and Inpatient level of care appropriate due to severity of illness   Dispo: The patient is from: Home              Anticipated d/c is to: Pending PT eval              Anticipated d/c date is: 2 days              Patient currently is not medically stable to d/c.  Objective: Blood pressure (!) 115/63, pulse 98, temperature 98.9 F (37.2 C), temperature source Oral, resp. rate 15, height 5\' 4"  (1.626  m), weight (!) 31.5 kg, SpO2 100 %.  Examination: General appearance: Severely cachectic appearing adult woman also appearing older than stated age lying in bed in no distress with significant other bedside and audible congested cough Head: Normocephalic, without obvious abnormality, atraumatic Eyes: EOMI Lungs: Diffuse coarse  breath sounds bilaterally with no significant wheezing appreciated Heart: regular rate and rhythm and S1, S2 normal Abdomen: Thin, soft, nontender, nondistended, bowel sounds present Extremities: Thin, no edema.  Surgical dressings in place over right hip.  Compartments soft.  No oozing or weeping around dressing sites Skin: Warm, dry, intact Neurologic: Grossly normal   Consultants:   Orthopedic surgery  Procedures:   IM nail fixation, left hip, 06/27/2020  Data Reviewed: I have personally reviewed following labs and imaging studies Results for orders placed or performed during the hospital encounter of 06/25/20 (from the past 24 hour(s))  Basic metabolic panel     Status: Abnormal   Collection Time: 06/28/20  3:45 AM  Result Value Ref Range   Sodium 140 135 - 145 mmol/L   Potassium 4.4 3.5 - 5.1 mmol/L   Chloride 108 98 - 111 mmol/L   CO2 23 22 - 32 mmol/L   Glucose, Bld 109 (H) 70 - 99 mg/dL   BUN 12 8 - 23 mg/dL   Creatinine, Ser 4.43 (L) 0.44 - 1.00 mg/dL   Calcium 8.3 (L) 8.9 - 10.3 mg/dL   GFR calc non Af Amer >60 >60 mL/min   GFR calc Af Amer >60 >60 mL/min   Anion gap 9 5 - 15  CBC with Differential/Platelet     Status: Abnormal   Collection Time: 06/28/20  3:45 AM  Result Value Ref Range   WBC 8.2 4.0 - 10.5 K/uL   RBC 3.02 (L) 3.87 - 5.11 MIL/uL   Hemoglobin 9.1 (L) 12.0 - 15.0 g/dL   HCT 15.4 (L) 36 - 46 %   MCV 97.0 80.0 - 100.0 fL   MCH 30.1 26.0 - 34.0 pg   MCHC 31.1 30.0 - 36.0 g/dL   RDW 00.8 (H) 67.6 - 19.5 %   Platelets 243 150 - 400 K/uL   nRBC 0.0 0.0 - 0.2 %   Neutrophils Relative % 65 %   Neutro Abs 5.4 1.7 - 7.7 K/uL   Lymphocytes Relative 14 %   Lymphs Abs 1.1 0.7 - 4.0 K/uL   Monocytes Relative 16 %   Monocytes Absolute 1.3 (H) 0 - 1 K/uL   Eosinophils Relative 3 %   Eosinophils Absolute 0.3 0 - 0 K/uL   Basophils Relative 1 %   Basophils Absolute 0.1 0 - 0 K/uL   Immature Granulocytes 1 %   Abs Immature Granulocytes 0.04 0.00 - 0.07  K/uL  Magnesium     Status: Abnormal   Collection Time: 06/28/20  3:45 AM  Result Value Ref Range   Magnesium 1.5 (L) 1.7 - 2.4 mg/dL  Phosphorus     Status: None   Collection Time: 06/28/20  3:45 AM  Result Value Ref Range   Phosphorus 2.9 2.5 - 4.6 mg/dL    Recent Results (from the past 240 hour(s))  SARS Coronavirus 2 by RT PCR (hospital order, performed in [facility] hospital lab) Nasopharyngeal Nasopharyngeal Swab     Status: None   Collection Time: 06/25/20  2:41 PM   Specimen: Nasopharyngeal Swab  Result Value Ref Range Status   SARS Coronavirus 2 NEGATIVE NEGATIVE Final    Comment: (NOTE) SARS-CoV-2 target nucleic acids are  NOT DETECTED.  The SARS-CoV-2 RNA is generally detectable in upper and lower respiratory specimens during the acute phase of infection. The lowest concentration of SARS-CoV-2 viral copies this assay can detect is 250 copies / mL. A negative result does not preclude SARS-CoV-2 infection and should not be used as the sole basis for treatment or other patient management decisions.  A negative result may occur with improper specimen collection / handling, submission of specimen other than nasopharyngeal swab, presence of viral mutation(s) within the areas targeted by this assay, and inadequate number of viral copies (<250 copies / mL). A negative result must be combined with clinical observations, patient history, and epidemiological information.  Fact Sheet for Patients:   BoilerBrush.com.cy  Fact Sheet for Healthcare Providers: https://pope.com/  This test is not yet approved or  cleared by the [place] FDA and has been authorized for detection and/or diagnosis of SARS-CoV-2 by FDA under an Emergency Use Authorization (EUA).  This EUA will remain in effect (meaning this test can be used) for the duration of the COVID-19 declaration under Section 564(b)(1) of the Act, 21 U.S.C. section  360bbb-3(b)(1), unless the authorization is terminated or revoked sooner.  Performed at [facility], [address], [place], [place] [address]   Culture, blood (routine x 2) Call MD if unable to obtain prior to antibiotics being given     Status: None (Preliminary result)   Collection Time: 06/25/20  8:22 PM   Specimen: BLOOD  Result Value Ref Range Status   Specimen Description BLOOD SITE NOT SPECIFIED  Final   Special Requests   Final    BOTTLES DRAWN AEROBIC AND ANAEROBIC Blood Culture adequate volume   Culture   Final    NO GROWTH 3 DAYS Performed at [facility], [address] [place], [place] [address]    Report Status PENDING  Incomplete  Culture, blood (routine x 2) Call MD if unable to obtain prior to antibiotics being given     Status: None (Preliminary result)   Collection Time: 06/25/20  8:23 PM   Specimen: BLOOD RIGHT WRIST  Result Value Ref Range Status   Specimen Description BLOOD RIGHT WRIST  Final   Special Requests   Final    BOTTLES DRAWN AEROBIC AND ANAEROBIC Blood Culture adequate volume   Culture   Final    NO GROWTH 3 DAYS Performed at [facility], [address] [place], [place] [address]    Report Status PENDING  Incomplete  Surgical pcr screen     Status: None   Collection Time: 06/27/20  3:50 AM   Specimen: Nasal Mucosa; Nasal Swab  Result Value Ref Range Status   MRSA, PCR NEGATIVE NEGATIVE Final   Staphylococcus aureus NEGATIVE NEGATIVE Final    Comment: (NOTE) The Xpert SA Assay (FDA approved for NASAL specimens in patients 56 years of age and older), is one component of a comprehensive surveillance program. It is not intended to diagnose infection nor to guide or monitor treatment. Performed at [facility], [address] [place], [place] [address]      Radiology Studies: DG C-Arm 1-60 Min  Result Date: 06/27/2020 CLINICAL DATA:  Hip surgery EXAM: OPERATIVE left HIP (WITH PELVIS IF PERFORMED) 5 VIEWS  TECHNIQUE: Fluoroscopic spot image(s) were submitted for interpretation post-operatively. COMPARISON:  06/25/2020 FINDINGS: Five low resolution intraoperative spot views of the left hip. Total fluoroscopy time was 43 seconds. The images demonstrate a comminuted intertrochanteric fracture of the  left femur. There is subsequent intramedullary rodding and distal screw fixation of the femur. IMPRESSION: Intraoperative fluoroscopic assistance provided during surgical fixation of left femoral fracture Electronically Signed   By: Jasmine Pang M.D.   On: 06/27/2020 17:40   DG HIP OPERATIVE UNILAT WITH PELVIS LEFT  Result Date: 06/27/2020 CLINICAL DATA:  Hip surgery EXAM: OPERATIVE left HIP (WITH PELVIS IF PERFORMED) 5 VIEWS TECHNIQUE: Fluoroscopic spot image(s) were submitted for interpretation post-operatively. COMPARISON:  06/25/2020 FINDINGS: Five low resolution intraoperative spot views of the left hip. Total fluoroscopy time was 43 seconds. The images demonstrate a comminuted intertrochanteric fracture of the left femur. There is subsequent intramedullary rodding and distal screw fixation of the femur. IMPRESSION: Intraoperative fluoroscopic assistance provided during surgical fixation of left femoral fracture Electronically Signed   By: Jasmine Pang M.D.   On: 06/27/2020 17:40   DG C-Arm 1-60 Min  Final Result    DG HIP OPERATIVE UNILAT WITH PELVIS LEFT  Final Result    DG Hip Unilat With Pelvis 2-3 Views Left  Final Result    DG Chest 1 View  Final Result       Scheduled Meds: . acetaminophen  1,000 mg Oral TID  . aspirin EC  325 mg Oral Q breakfast  . docusate sodium  100 mg Oral BID  . feeding supplement (ENSURE ENLIVE)  237 mL Oral TID BM  . feeding supplement  296 mL Oral Once  . folic acid  1 mg Oral Daily  . mometasone-formoterol  2 puff Inhalation BID  . multivitamin with minerals  1 tablet Oral Daily  . nicotine  21 mg Transdermal Daily  . pantoprazole  40 mg Oral BID  .  povidone-iodine  2 application Topical Once  . thiamine  100 mg Oral Daily   Or  . thiamine  100 mg Intravenous Daily   PRN Meds: albuterol, cyclobenzaprine, LORazepam **OR** LORazepam, menthol-cetylpyridinium **OR** phenol, metoCLOPramide **OR** metoCLOPramide (REGLAN) injection, naphazoline-glycerin, ondansetron **OR** ondansetron (ZOFRAN) IV, oxyCODONE Continuous Infusions: . azithromycin 500 mg (06/28/20 1743)  . cefTRIAXone (ROCEPHIN)  IV 1 g (06/28/20 1621)  . lactated ringers 10 mL/hr at 06/27/20 1507      LOS: 3 days  Time spent: Greater than 50% of the 35 minute visit was spent in counseling/coordination of care for the patient as laid out in the A&P.   Lewie Chamber, MD [facility] 06/28/2020, 5:49 PM   Contact via secure chat.  To contact the attending provider between 7A-7P or the covering provider during after hours 7P-7A, please log into the web site [email or web address] and access using universal [place] password for that web site. If you do not have the password, please call the hospital operator.

## 2020-06-29 DIAGNOSIS — S72002A Fracture of unspecified part of neck of left femur, initial encounter for closed fracture: Secondary | ICD-10-CM | POA: Diagnosis not present

## 2020-06-29 LAB — CBC WITH DIFFERENTIAL/PLATELET
Abs Immature Granulocytes: 0.03 10*3/uL (ref 0.00–0.07)
Basophils Absolute: 0 10*3/uL (ref 0.0–0.1)
Basophils Relative: 0 %
Eosinophils Absolute: 0.3 10*3/uL (ref 0.0–0.5)
Eosinophils Relative: 4 %
HCT: 25.3 % — ABNORMAL LOW (ref 36.0–46.0)
Hemoglobin: 7.9 g/dL — ABNORMAL LOW (ref 12.0–15.0)
Immature Granulocytes: 0 %
Lymphocytes Relative: 13 %
Lymphs Abs: 1 10*3/uL (ref 0.7–4.0)
MCH: 29.3 pg (ref 26.0–34.0)
MCHC: 31.2 g/dL (ref 30.0–36.0)
MCV: 93.7 fL (ref 80.0–100.0)
Monocytes Absolute: 1.1 10*3/uL — ABNORMAL HIGH (ref 0.1–1.0)
Monocytes Relative: 14 %
Neutro Abs: 5.3 10*3/uL (ref 1.7–7.7)
Neutrophils Relative %: 69 %
Platelets: 212 10*3/uL (ref 150–400)
RBC: 2.7 MIL/uL — ABNORMAL LOW (ref 3.87–5.11)
RDW: 16.3 % — ABNORMAL HIGH (ref 11.5–15.5)
WBC: 7.7 10*3/uL (ref 4.0–10.5)
nRBC: 0 % (ref 0.0–0.2)

## 2020-06-29 LAB — BASIC METABOLIC PANEL
Anion gap: 7 (ref 5–15)
BUN: 12 mg/dL (ref 8–23)
CO2: 27 mmol/L (ref 22–32)
Calcium: 8.1 mg/dL — ABNORMAL LOW (ref 8.9–10.3)
Chloride: 105 mmol/L (ref 98–111)
Creatinine, Ser: 0.4 mg/dL — ABNORMAL LOW (ref 0.44–1.00)
GFR calc Af Amer: >60 mL/min (ref >60)
GFR calc non Af Amer: >60 mL/min (ref >60)
Glucose, Bld: 110 mg/dL — ABNORMAL HIGH (ref 70–99)
Potassium: 4.4 mmol/L (ref 3.5–5.1)
Sodium: 139 mmol/L (ref 135–145)

## 2020-06-29 LAB — MAGNESIUM: Magnesium: 1.7 mg/dL (ref 1.7–2.4)

## 2020-06-29 LAB — PHOSPHORUS: Phosphorus: 2.8 mg/dL (ref 2.5–4.6)

## 2020-06-29 MED ORDER — MAGNESIUM OXIDE 400 (241.3 MG) MG PO TABS
800.0000 mg | ORAL_TABLET | Freq: Once | ORAL | Status: AC
  Administered 2020-06-29: 800 mg via ORAL
  Filled 2020-06-29: qty 2

## 2020-06-29 NOTE — Progress Notes (Addendum)
PROGRESS NOTE    Kristine Roberts   ZJI:[id number]  DOB: July 21, 1956  DOA: 06/25/2020     4  PCP: [facility]  CC: fall at home  Hospital Course: Kristine Roberts is a 64 y.o. female with PMH significant of COPD, continues to smoke, continues to use alcohol, last drink 1 day prior to admission, history of peptic ulcer disease, severe malnutrition, osteoporosis. Patient presented to the ED on 7/21 with complaint of cough, dyspnea, excessive phlegm and occasional fever, gradually worsening for 2 weeks. The night previous, patient got up to go to bathroom, legs gave out, fell to floor. Her boyfriend helped her back to bed but next morning she had generalized weakness and was unable to get out of bed. In general, she endorses chronic nausea and vomiting with almost every meal she eats.  She attributes her frailty and progressive weight loss due to being unable to keep adequate nutrition down.   In the ED, patient was afebrile, hemodynamically stable, required 2 L oxygen by nasal cannula.  Labs with WBC 14.1. Left hip x-ray showed left intertrochanteric hip fracture. CXR suggestive of left upper lobe pneumonia. ECG with irregular sinus with RBBB - chronic and unchanged since at least 2019.  She underwent IM fixation in the OR on 7/23 and tolerated well.  She started working with PT on 7/24 with initial assessments recommending SNF but patient does wish to get to go home from hospital if possible and able to regain enough mobility/independence back in time.    Interval History:  No events overnight. Appears uncomfortable in bed this morning and states that she has had difficulty keeping food down. Explained that she would need to remain on oral medications in order to plan for appropriate discharge. Chronic nausea has been an issue with her for several years now.  Old records reviewed in assessment of this patient  ROS: Constitutional: negative for chills and fevers,  Respiratory: positive for cough, Cardiovascular: negative for chest pain and Gastrointestinal: negative for abdominal pain  Assessment & Plan: COPD (chronic obstructive pulmonary disease) (HCC) -Continues to smoke.  Cessation counseling given bedside  Protein-calorie malnutrition, severe -Patient endorses significant nausea and vomiting with almost every meal.  She also continues to smoke and drink -Continue encouraging good oral intake - follow up nutrition consult   ETOH abuse -Ongoing alcohol use at home -Seen after surgery, no signs or symptoms of alcohol withdrawal. She was initially monitored on CIWA protocol but did not exhibit any signs of withdrawal therefore was discontinued from CIWA.  Gastric ulcer -Continue PPI.  Informed her that her drinking and smoking also does not help  Closed left hip fracture, initial encounter (HCC) - s/p IM fixation on 06/27/2020 with orthopedic surgery -Continue pain control postop -PT eval; currently recommending SNF placement. We will continue to monitor her progress over the next 1 to 2 days then pursue appropriate dispo plans and discuss with patient.  Community acquired pneumonia of left upper lobe of lung Continue Rocephin and azithromycin   Antimicrobials: Rocephin 06/26/2020>> present Azithromycin 06/26/2020>> present  DVT prophylaxis: Lovenox Code Status: Full Family Communication: Significant other bedside Disposition Plan:  Status is: Inpatient  Remains inpatient appropriate because:Ongoing active pain requiring inpatient pain management, IV treatments appropriate due to intensity of illness or inability to take PO and Inpatient level of care appropriate due to severity of illness   Dispo: The patient is from: Home  Anticipated d/c is to: Pending PT eval              Anticipated d/c date is: 2 days              Patient currently is not medically stable to d/c.  Objective: Blood pressure 112/75, pulse 93,  temperature 98.7 F (37.1 C), temperature source Oral, resp. rate 16, height 5\' 4"  (1.626 m), weight (!) 31.5 kg, SpO2 100 %.  Examination: General appearance: Severely cachectic appearing adult woman also appearing older than stated age lying in bed in no distress with significant other bedside and audible congested cough Head: Normocephalic, without obvious abnormality, atraumatic Eyes: EOMI Lungs: Diffuse coarse breath sounds bilaterally with no significant wheezing appreciated Heart: regular rate and rhythm and S1, S2 normal Abdomen: Thin, soft, nontender, nondistended, bowel sounds present Extremities: Thin, no edema.  Surgical dressings in place over right hip.  Compartments soft.  No oozing or weeping around dressing sites. Little more swelling appreciated in left thigh this morning Skin: Warm, dry, intact Neurologic: Grossly normal   Consultants:   Orthopedic surgery  Procedures:   IM nail fixation, left hip, 06/27/2020  Data Reviewed: I have personally reviewed following labs and imaging studies Results for orders placed or performed during the hospital encounter of 06/25/20 (from the past 24 hour(s))  Basic metabolic panel     Status: Abnormal   Collection Time: 06/29/20  2:25 AM  Result Value Ref Range   Sodium 139 135 - 145 mmol/L   Potassium 4.4 3.5 - 5.1 mmol/L   Chloride 105 98 - 111 mmol/L   CO2 27 22 - 32 mmol/L   Glucose, Bld 110 (H) 70 - 99 mg/dL   BUN 12 8 - 23 mg/dL   Creatinine, Ser 07/01/20 (L) 0.44 - 1.00 mg/dL   Calcium 8.1 (L) 8.9 - 10.3 mg/dL   GFR calc non Af Amer >60 >60 mL/min   GFR calc Af Amer >60 >60 mL/min   Anion gap 7 5 - 15  CBC with Differential/Platelet     Status: Abnormal   Collection Time: 06/29/20  2:25 AM  Result Value Ref Range   WBC 7.7 4.0 - 10.5 K/uL   RBC 2.70 (L) 3.87 - 5.11 MIL/uL   Hemoglobin 7.9 (L) 12.0 - 15.0 g/dL   HCT 07/01/20 (L) 36 - 46 %   MCV 93.7 80.0 - 100.0 fL   MCH 29.3 26.0 - 34.0 pg   MCHC 31.2 30.0 - 36.0 g/dL    RDW 86.7 (H) 67.2 - 15.5 %   Platelets 212 150 - 400 K/uL   nRBC 0.0 0.0 - 0.2 %   Neutrophils Relative % 69 %   Neutro Abs 5.3 1.7 - 7.7 K/uL   Lymphocytes Relative 13 %   Lymphs Abs 1.0 0.7 - 4.0 K/uL   Monocytes Relative 14 %   Monocytes Absolute 1.1 (H) 0 - 1 K/uL   Eosinophils Relative 4 %   Eosinophils Absolute 0.3 0 - 0 K/uL   Basophils Relative 0 %   Basophils Absolute 0.0 0 - 0 K/uL   Immature Granulocytes 0 %   Abs Immature Granulocytes 0.03 0.00 - 0.07 K/uL  Magnesium     Status: None   Collection Time: 06/29/20  2:25 AM  Result Value Ref Range   Magnesium 1.7 1.7 - 2.4 mg/dL  Phosphorus     Status: None   Collection Time: 06/29/20  2:25 AM  Result Value Ref Range  Phosphorus 2.8 2.5 - 4.6 mg/dL    Recent Results (from the past 240 hour(s))  SARS Coronavirus 2 by RT PCR (hospital order, performed in [facility] hospital lab) Nasopharyngeal Nasopharyngeal Swab     Status: None   Collection Time: 06/25/20  2:41 PM   Specimen: Nasopharyngeal Swab  Result Value Ref Range Status   SARS Coronavirus 2 NEGATIVE NEGATIVE Final    Comment: (NOTE) SARS-CoV-2 target nucleic acids are NOT DETECTED.  The SARS-CoV-2 RNA is generally detectable in upper and lower respiratory specimens during the acute phase of infection. The lowest concentration of SARS-CoV-2 viral copies this assay can detect is 250 copies / mL. A negative result does not preclude SARS-CoV-2 infection and should not be used as the sole basis for treatment or other patient management decisions.  A negative result may occur with improper specimen collection / handling, submission of specimen other than nasopharyngeal swab, presence of viral mutation(s) within the areas targeted by this assay, and inadequate number of viral copies (<250 copies / mL). A negative result must be combined with clinical observations, patient history, and epidemiological information.  Fact Sheet for Patients:     BoilerBrush.com.cy  Fact Sheet for Healthcare Providers: https://pope.com/  This test is not yet approved or  cleared by the [place] FDA and has been authorized for detection and/or diagnosis of SARS-CoV-2 by FDA under an Emergency Use Authorization (EUA).  This EUA will remain in effect (meaning this test can be used) for the duration of the COVID-19 declaration under Section 564(b)(1) of the Act, 21 U.S.C. section 360bbb-3(b)(1), unless the authorization is terminated or revoked sooner.  Performed at [facility], [address], [place], [place] [address]   Culture, blood (routine x 2) Call MD if unable to obtain prior to antibiotics being given     Status: None (Preliminary result)   Collection Time: 06/25/20  8:22 PM   Specimen: BLOOD  Result Value Ref Range Status   Specimen Description BLOOD SITE NOT SPECIFIED  Final   Special Requests   Final    BOTTLES DRAWN AEROBIC AND ANAEROBIC Blood Culture adequate volume   Culture   Final    NO GROWTH 4 DAYS Performed at [facility], [address], [place], [place] [address]    Report Status PENDING  Incomplete  Culture, blood (routine x 2) Call MD if unable to obtain prior to antibiotics being given     Status: None (Preliminary result)   Collection Time: 06/25/20  8:23 PM   Specimen: BLOOD RIGHT WRIST  Result Value Ref Range Status   Specimen Description BLOOD RIGHT WRIST  Final   Special Requests   Final    BOTTLES DRAWN AEROBIC AND ANAEROBIC Blood Culture adequate volume   Culture   Final    NO GROWTH 4 DAYS Performed at [facility], [address] [place], [place] [address]    Report Status PENDING  Incomplete  Surgical pcr screen     Status: None   Collection Time: 06/27/20  3:50 AM   Specimen: Nasal Mucosa; Nasal Swab  Result Value Ref Range Status   MRSA, PCR NEGATIVE NEGATIVE Final   Staphylococcus aureus NEGATIVE NEGATIVE Final     Comment: (NOTE) The Xpert SA Assay (FDA approved for NASAL specimens in patients 67 years of age and older), is one component of a comprehensive surveillance program. It is not intended to diagnose infection nor to guide or monitor treatment. Performed at [facility]  [facility], [address], [place], [place] [address]      Radiology Studies: DG C-Arm 1-60 Min  Result Date: 06/27/2020 CLINICAL DATA:  Hip surgery EXAM: OPERATIVE left HIP (WITH PELVIS IF PERFORMED) 5 VIEWS TECHNIQUE: Fluoroscopic spot image(s) were submitted for interpretation post-operatively. COMPARISON:  06/25/2020 FINDINGS: Five low resolution intraoperative spot views of the left hip. Total fluoroscopy time was 43 seconds. The images demonstrate a comminuted intertrochanteric fracture of the left femur. There is subsequent intramedullary rodding and distal screw fixation of the femur. IMPRESSION: Intraoperative fluoroscopic assistance provided during surgical fixation of left femoral fracture Electronically Signed   By: Jasmine Pang M.D.   On: 06/27/2020 17:40   DG HIP OPERATIVE UNILAT WITH PELVIS LEFT  Result Date: 06/27/2020 CLINICAL DATA:  Hip surgery EXAM: OPERATIVE left HIP (WITH PELVIS IF PERFORMED) 5 VIEWS TECHNIQUE: Fluoroscopic spot image(s) were submitted for interpretation post-operatively. COMPARISON:  06/25/2020 FINDINGS: Five low resolution intraoperative spot views of the left hip. Total fluoroscopy time was 43 seconds. The images demonstrate a comminuted intertrochanteric fracture of the left femur. There is subsequent intramedullary rodding and distal screw fixation of the femur. IMPRESSION: Intraoperative fluoroscopic assistance provided during surgical fixation of left femoral fracture Electronically Signed   By: Jasmine Pang M.D.   On: 06/27/2020 17:40   DG C-Arm 1-60 Min  Final Result    DG HIP OPERATIVE UNILAT WITH PELVIS LEFT  Final Result    DG Hip Unilat With Pelvis 2-3 Views Left  Final Result     DG Chest 1 View  Final Result       Scheduled Meds: . acetaminophen  1,000 mg Oral TID  . aspirin EC  325 mg Oral Q breakfast  . docusate sodium  100 mg Oral BID  . enoxaparin (LOVENOX) injection  30 mg Subcutaneous Q24H  . feeding supplement (ENSURE ENLIVE)  237 mL Oral TID BM  . feeding supplement  296 mL Oral Once  . folic acid  1 mg Oral Daily  . mometasone-formoterol  2 puff Inhalation BID  . multivitamin with minerals  1 tablet Oral Daily  . nicotine  21 mg Transdermal Daily  . pantoprazole  40 mg Oral BID  . povidone-iodine  2 application Topical Once  . thiamine  100 mg Oral Daily   Or  . thiamine  100 mg Intravenous Daily   PRN Meds: albuterol, cyclobenzaprine, menthol-cetylpyridinium **OR** phenol, metoCLOPramide **OR** metoCLOPramide (REGLAN) injection, naphazoline-glycerin, ondansetron **OR** ondansetron (ZOFRAN) IV, oxyCODONE Continuous Infusions: . azithromycin 500 mg (06/28/20 1743)  . cefTRIAXone (ROCEPHIN)  IV 1 g (06/28/20 1621)  . lactated ringers 10 mL/hr at 06/27/20 1507      LOS: 4 days  Time spent: Greater than 50% of the 35 minute visit was spent in counseling/coordination of care for the patient as laid out in the A&P.   Lewie Chamber, MD [facility] 06/29/2020, 1:24 PM   Contact via secure chat.  To contact the attending provider between 7A-7P or the covering provider during after hours 7P-7A, please log into the web site [email or web address] and access using universal [place] password for that web site. If you do not have the password, please call the hospital operator.

## 2020-06-29 NOTE — Plan of Care (Signed)
  Problem: Coping: Goal: Level of anxiety will decrease Outcome: Progressing   Problem: Pain Managment: Goal: General experience of comfort will improve Outcome: Progressing   Problem: Safety: Goal: Ability to remain free from injury will improve Outcome: Progressing   Problem: Skin Integrity: Goal: Risk for impaired skin integrity will decrease Outcome: Progressing   

## 2020-06-29 NOTE — Progress Notes (Signed)
   Subjective:  Patient reports pain as moderate.  Doing okay today.  No specific complaints of shortness of breath, fever, night sweats or chills.  Objective:   VITALS:   Vitals:   06/29/20 0910 06/29/20 0910 06/29/20 1339 06/29/20 1340  BP: 112/75 112/75 114/74 114/74  Pulse: 94 93 94 104  Resp: 16 16 17 17   Temp: 98.7 F (37.1 C) 98.7 F (37.1 C) 98.9 F (37.2 C) 98.9 F (37.2 C)  TempSrc: Oral Oral Oral Oral  SpO2: 100% 100% 100% 100%  Weight:      Height:        Neurologically intact Sensation intact distally Intact pulses distally Dorsiflexion/Plantar flexion intact Incision: dressing C/D/I Compartment soft   Lab Results  Component Value Date   WBC 7.7 06/29/2020   HGB 7.9 (L) 06/29/2020   HCT 25.3 (L) 06/29/2020   MCV 93.7 06/29/2020   PLT 212 06/29/2020   BMET    Component Value Date/Time   NA 139 06/29/2020 0225   K 4.4 06/29/2020 0225   CL 105 06/29/2020 0225   CO2 27 06/29/2020 0225   GLUCOSE 110 (H) 06/29/2020 0225   BUN 12 06/29/2020 0225   CREATININE 0.40 (L) 06/29/2020 0225   CALCIUM 8.1 (L) 06/29/2020 0225   GFRNONAA >60 06/29/2020 0225   GFRAA >60 06/29/2020 0225     Assessment/Plan: 2 Days Post-Op   Principal Problem:   Closed left hip fracture, initial encounter (HCC) Active Problems:   COPD (chronic obstructive pulmonary disease) (HCC)   Protein-calorie malnutrition, severe   ETOH abuse   Gastric ulcer   Community acquired pneumonia of left upper lobe of lung   RBBB   Up with therapy -Okay for weightbearing as tolerated.  May remove Ace bandage in 1 day.  Otherwise deep bandages should remain in place until follow-up appointment with Dr. 07/01/2020 in 2 weeks.  -SCDs while in bed with aspirin, 325 mg daily x6 weeks for DVT prophylaxis.  -We will need follow-up with Dr. Aundria Rud in 2 weeks.   Aundria Rud 06/29/2020, 3:14 PM   07/01/2020, MD [phone]

## 2020-06-29 NOTE — Discharge Instructions (Signed)
-  Okay for full weightbearing as tolerated to the left lower extremity.  You should use her assistive device as necessary.  -Maintain your postoperative bandages until your follow-up appointment.  You may shower with these in place.  But do not submerge underwater.  -Apply ice to the left hip for 30 minutes out of each hour that you are awake.  -For mild to moderate pain use Norco as necessary.  You should also take Tylenol and Advil around-the-clock for mild pain to reduce your overall need for narcotic.  -Take a 325 mg aspirin once per day for 6 weeks for prevention of blood clots.  -Return to see Dr. Aundria Rud in 2 weeks for routine postop care.

## 2020-06-29 NOTE — Plan of Care (Signed)

## 2020-06-29 NOTE — Progress Notes (Signed)
Physical Therapy Treatment Patient Details Name: Kristine Roberts MRN: [id number] DOB: 04/12/1956 Today's Date: 06/29/2020    History of Present Illness Kristine Roberts is a 64 y.o. female s/p fall at home with l hip fx and underwent IM nail on 7/23.  PMH significant for but not minited to COPD not on home O2 but still smoking, EtOH abuse, PUD, severe malnutrition, osteoporosis and r hip fx.    PT Comments    Pt willing to get out of bed today, but unable to ambulate. Anticipate slow, but steady, progress.     Follow Up Recommendations  SNF;Supervision for mobility/OOB     Equipment Recommendations  None recommended by PT    Recommendations for Other Services OT consult     Precautions / Restrictions Precautions Precautions: Fall Restrictions Weight Bearing Restrictions: Yes LLE Weight Bearing: Weight bearing as tolerated    Mobility  Bed Mobility Overal bed mobility: Needs Assistance Bed Mobility: Supine to Sit     Supine to sit: Mod assist     General bed mobility comments: Needs assist with LLE and upper body  Transfers Overall transfer level: Needs assistance Equipment used: Rolling walker (2 wheeled) Transfers: Sit to/from [employer] Sit to Stand: Mod assist;+2 safety/equipment Stand pivot transfers: Mod assist;+2 physical assistance          Ambulation/Gait Ambulation/Gait assistance:  (pt stated she couldn't walk today when she was on her feet)               Stairs             Wheelchair Mobility    Modified Rankin (Stroke Patients Only)       Balance                                            Cognition Arousal/Alertness: Awake/alert Behavior During Therapy: WFL for tasks assessed/performed Overall Cognitive Status: Within Functional Limits for tasks assessed                                        Exercises Total Joint Exercises Ankle Circles/Pumps: AROM;Both;10  reps;Supine Short Arc Quad: AAROM;Left;10 reps;Supine Heel Slides: AAROM;Left;10 reps;Supine Hip ABduction/ADduction: AAROM;Left;10 reps;Supine    General Comments General comments (skin integrity, edema, etc.): no family present.       Pertinent Vitals/Pain Pain Assessment: 0-10 Pain Score: 8  Pain Location: left mid thigh Pain Descriptors / Indicators: Constant;Grimacing;Guarding Pain Intervention(s): Limited activity within patient's tolerance;Premedicated before session;Monitored during session    Home Living                      Prior Function            PT Goals (current goals can now be found in the care plan section) Progress towards PT goals: Progressing toward goals    Frequency    Min 4X/week      PT Plan Current plan remains appropriate    Co-evaluation              AM-PAC PT "6 Clicks" Mobility   Outcome Measure  Help needed turning from your back to your side while in a flat bed without using bedrails?: A Lot Help needed moving from lying on your back to sitting on the side  of a flat bed without using bedrails?: A Lot Help needed moving to and from a bed to a chair (including a wheelchair)?: A Lot Help needed standing up from a chair using your arms (e.g., wheelchair or bedside chair)?: A Lot Help needed to walk in hospital room?: A Lot Help needed climbing 3-5 steps with a railing? : Total 6 Click Score: 11    End of Session Equipment Utilized During Treatment: Gait belt;Oxygen Activity Tolerance: Patient limited by pain Patient left: in chair;with chair alarm set;with call bell/phone within reach Nurse Communication: Mobility status (Discussed with NT) PT Visit Diagnosis: Other abnormalities of gait and mobility (R26.89)     Time: [phone] (Also did ther ex from [phone]) PT Time Calculation (min) (ACUTE ONLY): 16 min  Charges:  $Gait Training: 8-22 mins $Therapeutic Exercise: 8-22 mins                     Lavona Mound,  PT   Acute Rehabilitation Services  Pager [phone] Office [phone] 06/29/2020    Donnella Sham 06/29/2020, 10:36 AM

## 2020-06-30 ENCOUNTER — Encounter (HOSPITAL_COMMUNITY): Payer: Self-pay | Admitting: Orthopedic Surgery

## 2020-06-30 DIAGNOSIS — S72002A Fracture of unspecified part of neck of left femur, initial encounter for closed fracture: Secondary | ICD-10-CM | POA: Diagnosis not present

## 2020-06-30 DIAGNOSIS — J189 Pneumonia, unspecified organism: Secondary | ICD-10-CM | POA: Diagnosis not present

## 2020-06-30 LAB — CULTURE, BLOOD (ROUTINE X 2)
Culture: NO GROWTH
Culture: NO GROWTH
Special Requests: ADEQUATE
Special Requests: ADEQUATE

## 2020-06-30 LAB — PHOSPHORUS: Phosphorus: 3.6 mg/dL (ref 2.5–4.6)

## 2020-06-30 LAB — BASIC METABOLIC PANEL
Anion gap: 7 (ref 5–15)
BUN: 11 mg/dL (ref 8–23)
CO2: 26 mmol/L (ref 22–32)
Calcium: 7.7 mg/dL — ABNORMAL LOW (ref 8.9–10.3)
Chloride: 104 mmol/L (ref 98–111)
Creatinine, Ser: 0.37 mg/dL — ABNORMAL LOW (ref 0.44–1.00)
GFR calc Af Amer: >60 mL/min (ref >60)
GFR calc non Af Amer: >60 mL/min (ref >60)
Glucose, Bld: 85 mg/dL (ref 70–99)
Potassium: 4.3 mmol/L (ref 3.5–5.1)
Sodium: 137 mmol/L (ref 135–145)

## 2020-06-30 LAB — CBC WITH DIFFERENTIAL/PLATELET
Abs Immature Granulocytes: 0.03 10*3/uL (ref 0.00–0.07)
Basophils Absolute: 0 10*3/uL (ref 0.0–0.1)
Basophils Relative: 1 %
Eosinophils Absolute: 0.4 10*3/uL (ref 0.0–0.5)
Eosinophils Relative: 5 %
HCT: 25.7 % — ABNORMAL LOW (ref 36.0–46.0)
Hemoglobin: 7.9 g/dL — ABNORMAL LOW (ref 12.0–15.0)
Immature Granulocytes: 0 %
Lymphocytes Relative: 17 %
Lymphs Abs: 1.2 10*3/uL (ref 0.7–4.0)
MCH: 29.2 pg (ref 26.0–34.0)
MCHC: 30.7 g/dL (ref 30.0–36.0)
MCV: 94.8 fL (ref 80.0–100.0)
Monocytes Absolute: 1 10*3/uL (ref 0.1–1.0)
Monocytes Relative: 14 %
Neutro Abs: 4.4 10*3/uL (ref 1.7–7.7)
Neutrophils Relative %: 63 %
Platelets: 209 10*3/uL (ref 150–400)
RBC: 2.71 MIL/uL — ABNORMAL LOW (ref 3.87–5.11)
RDW: 16.3 % — ABNORMAL HIGH (ref 11.5–15.5)
WBC: 7 10*3/uL (ref 4.0–10.5)
nRBC: 0 % (ref 0.0–0.2)

## 2020-06-30 LAB — MAGNESIUM: Magnesium: 1.8 mg/dL (ref 1.7–2.4)

## 2020-06-30 MED ORDER — AZITHROMYCIN 500 MG PO TABS
500.0000 mg | ORAL_TABLET | Freq: Every day | ORAL | Status: DC
  Administered 2020-06-30 – 2020-07-01 (×2): 500 mg via ORAL
  Filled 2020-06-30 (×2): qty 1

## 2020-06-30 NOTE — Progress Notes (Signed)
Physical Therapy Treatment Patient Details Name: Kristine Roberts MRN: [id number] DOB: 05/09/1956 Today's Date: 06/30/2020    History of Present Illness Kristine Roberts is a 64 y.o. female s/p fall at home with l hip fx and underwent IM nail on 7/23.  PMH significant for but not minited to COPD not on home O2 but still smoking, EtOH abuse, PUD, severe malnutrition, osteoporosis and r hip fx.    PT Comments    Pt very anxious but motivated to try and progress.  She did considerably better than previous session and pain was well controlled.   Continue to recommend snf placement at d/c to continue to progress functional mobility before returning home.    Follow Up Recommendations  SNF;Supervision for mobility/OOB     Equipment Recommendations  None recommended by PT    Recommendations for Other Services OT consult     Precautions / Restrictions Precautions Precautions: Fall Restrictions Weight Bearing Restrictions: Yes LLE Weight Bearing: Weight bearing as tolerated    Mobility  Bed Mobility Overal bed mobility: Needs Assistance Bed Mobility: Supine to Sit;Sit to Supine     Supine to sit: Min assist Sit to supine: Mod assist   General bed mobility comments: min assistance to move LLE to edge of bed.  Mod assistance to lift B LEs when returning back to bed.  Transfers Overall transfer level: Needs assistance Equipment used: Rolling walker (2 wheeled) Transfers: Sit to/from Stand Sit to Stand: Min guard         General transfer comment: Cues for hand placement to and from seated surface.  Ambulation/Gait Ambulation/Gait assistance: Min guard Gait Distance (Feet): 40 Feet Assistive device: Rolling walker (2 wheeled) Gait Pattern/deviations: Step-to pattern;Shuffle;Trunk flexed;Antalgic     General Gait Details: Cues for sequencing and upper trunk control.   Stairs             Wheelchair Mobility    Modified Rankin (Stroke Patients Only)        Balance Overall balance assessment: Needs assistance   Sitting balance-Leahy Scale: Fair       Standing balance-Leahy Scale: Poor Standing balance comment: heavy reliance on RW                            Cognition Arousal/Alertness: Awake/alert Behavior During Therapy: WFL for tasks assessed/performed Overall Cognitive Status: Within Functional Limits for tasks assessed                                        Exercises General Exercises - Lower Extremity Long Arc Quad: AROM;Left;10 reps;Supine    General Comments        Pertinent Vitals/Pain Pain Assessment: 0-10 Pain Location: left mid thigh Pain Descriptors / Indicators: Constant;Grimacing;Guarding Pain Intervention(s): Monitored during session;Repositioned    Home Living                      Prior Function            PT Goals (current goals can now be found in the care plan section) Acute Rehab PT Goals Patient Stated Goal: to get OOB tomorrow Potential to Achieve Goals: Good Progress towards PT goals: Progressing toward goals    Frequency    Min 4X/week      PT Plan Current plan remains appropriate    Co-evaluation  AM-PAC PT "6 Clicks" Mobility   Outcome Measure  Help needed turning from your back to your side while in a flat bed without using bedrails?: A Little Help needed moving from lying on your back to sitting on the side of a flat bed without using bedrails?: A Little Help needed moving to and from a bed to a chair (including a wheelchair)?: A Little Help needed standing up from a chair using your arms (e.g., wheelchair or bedside chair)?: A Little Help needed to walk in hospital room?: A Little Help needed climbing 3-5 steps with a railing? : A Little 6 Click Score: 18    End of Session Equipment Utilized During Treatment: Gait belt Activity Tolerance: Patient limited by pain Patient left: in chair;with chair alarm set;with call  bell/phone within reach Nurse Communication: Mobility status PT Visit Diagnosis: Other abnormalities of gait and mobility (R26.89)     Time: 1884-1660 PT Time Calculation (min) (ACUTE ONLY): 21 min  Charges:  $Gait Training: 8-22 mins                     Bonney Leitz , PTA Acute Rehabilitation Services Pager [phone] Office [phone]     Zillah Alexie Artis Delay 06/30/2020, 5:36 PM

## 2020-06-30 NOTE — Progress Notes (Signed)
PROGRESS NOTE    Kristine Roberts   KXF:[id number]  DOB: 01/10/1956  DOA: 06/25/2020     5  PCP: [facility]  CC: fall at home  Hospital Course: Kristine Roberts is a 64 y.o. female with PMH significant of COPD, continues to smoke, continues to use alcohol, last drink 1 day prior to admission, history of peptic ulcer disease, severe malnutrition, osteoporosis. Patient presented to the ED on 7/21 with complaint of cough, dyspnea, excessive phlegm and occasional fever, gradually worsening for 2 weeks. The night previous, patient got up to go to bathroom, legs gave out, fell to floor. Her boyfriend helped her back to bed but next morning she had generalized weakness and was unable to get out of bed. In general, she endorses chronic nausea and vomiting with almost every meal she eats.  She attributes her frailty and progressive weight loss due to being unable to keep adequate nutrition down.   In the ED, patient was afebrile, hemodynamically stable, required 2 L oxygen by nasal cannula.  Labs with WBC 14.1. Left hip x-ray showed left intertrochanteric hip fracture. CXR suggestive of left upper lobe pneumonia.  She was started on abx for PNA coverage and responded well.   She underwent IM fixation in the OR on 7/23 and tolerated well.  She started working with PT on 7/24 with initial assessments recommending SNF. Patient also is in agreement with rehab after thinking about this recommendation while she has been recovering.    Interval History:  No events overnight. Resting in bed this am, and her breathing is more comfortable daily. She also is now wanting to pursue rehab after hospital discharge.   Old records reviewed in assessment of this patient  ROS: Constitutional: negative for chills and fevers, Respiratory: positive for cough, Cardiovascular: negative for chest pain and Gastrointestinal: negative for abdominal pain  Assessment & Plan: COPD (chronic obstructive  pulmonary disease) (HCC) -Continues to smoke.  Cessation counseling given bedside  Protein-calorie malnutrition, severe -Patient endorses significant nausea and vomiting with almost every meal.  She also continues to smoke and drink -Continue encouraging good oral intake - follow up nutrition consult   ETOH abuse -Ongoing alcohol use at home -Seen after surgery, no signs or symptoms of alcohol withdrawal. She was initially monitored on CIWA protocol but did not exhibit any signs of withdrawal therefore was discontinued from CIWA.  Gastric ulcer -Continue PPI.  Informed her that her drinking and smoking also does not help  Closed left hip fracture, initial encounter (HCC) - s/p IM fixation on 06/27/2020 with orthopedic surgery -Continue pain control postop -PT eval; currently recommending SNF placement. Patient has thought about this and now is in agreement with pursing rehab. Will discuss with SW/CM to start working on process  Community acquired pneumonia of left upper lobe of lung Continue Rocephin and azithromycin - clinically responding well; plan to complete 5 day course    Antimicrobials: Rocephin 06/28/2020>> present Azithromycin 06/28/2020>> present  DVT prophylaxis: Lovenox Code Status: Full Family Communication: Significant other  Disposition Plan:  Status is: Inpatient  Remains inpatient appropriate because:Ongoing active pain requiring inpatient pain management, IV treatments appropriate due to intensity of illness or inability to take PO and Inpatient level of care appropriate due to severity of illness   Dispo: The patient is from: Home              Anticipated d/c is to: Pending PT eval  Anticipated d/c date is: 2 days              Patient currently is not medically stable to d/c.  Objective: Blood pressure 103/67, pulse 80, temperature 98.4 F (36.9 C), temperature source Oral, resp. rate 17, height 5\' 4"  (1.626 m), weight (!) 31.5 kg, SpO2 98 %.   Examination: General appearance: Severely cachectic appearing adult woman also appearing older than stated age lying in bed in no distress with significant other bedside and audible congested cough Head: Normocephalic, without obvious abnormality, atraumatic Eyes: EOMI Lungs: mild coarse BS bilaterally, no wheezing; significant improvement since priors Heart: regular rate and rhythm and S1, S2 normal Abdomen: Thin, soft, nontender, nondistended, bowel sounds present Extremities: Thin.  Surgical dressings in place over right hip.  Compartments soft.  No oozing or weeping around dressing sites. Mild swelling appreciated in left thigh this morning Skin: Warm, dry, intact Neurologic: Grossly normal   Consultants:   Orthopedic surgery  Procedures:   IM nail fixation, left hip, 06/27/2020  Data Reviewed: I have personally reviewed following labs and imaging studies Results for orders placed or performed during the hospital encounter of 06/25/20 (from the past 24 hour(s))  Basic metabolic panel     Status: Abnormal   Collection Time: 06/30/20  6:35 AM  Result Value Ref Range   Sodium 137 135 - 145 mmol/L   Potassium 4.3 3.5 - 5.1 mmol/L   Chloride 104 98 - 111 mmol/L   CO2 26 22 - 32 mmol/L   Glucose, Bld 85 70 - 99 mg/dL   BUN 11 8 - 23 mg/dL   Creatinine, Ser 07/02/20 (L) 0.44 - 1.00 mg/dL   Calcium 7.7 (L) 8.9 - 10.3 mg/dL   GFR calc non Af Amer >60 >60 mL/min   GFR calc Af Amer >60 >60 mL/min   Anion gap 7 5 - 15  CBC with Differential/Platelet     Status: Abnormal   Collection Time: 06/30/20  6:35 AM  Result Value Ref Range   WBC 7.0 4.0 - 10.5 K/uL   RBC 2.71 (L) 3.87 - 5.11 MIL/uL   Hemoglobin 7.9 (L) 12.0 - 15.0 g/dL   HCT 07/02/20 (L) 36 - 46 %   MCV 94.8 80.0 - 100.0 fL   MCH 29.2 26.0 - 34.0 pg   MCHC 30.7 30.0 - 36.0 g/dL   RDW 63.8 (H) 75.6 - 43.3 %   Platelets 209 150 - 400 K/uL   nRBC 0.0 0.0 - 0.2 %   Neutrophils Relative % 63 %   Neutro Abs 4.4 1.7 - 7.7 K/uL    Lymphocytes Relative 17 %   Lymphs Abs 1.2 0.7 - 4.0 K/uL   Monocytes Relative 14 %   Monocytes Absolute 1.0 0 - 1 K/uL   Eosinophils Relative 5 %   Eosinophils Absolute 0.4 0 - 0 K/uL   Basophils Relative 1 %   Basophils Absolute 0.0 0 - 0 K/uL   Immature Granulocytes 0 %   Abs Immature Granulocytes 0.03 0.00 - 0.07 K/uL  Magnesium     Status: None   Collection Time: 06/30/20  6:35 AM  Result Value Ref Range   Magnesium 1.8 1.7 - 2.4 mg/dL  Phosphorus     Status: None   Collection Time: 06/30/20  6:35 AM  Result Value Ref Range   Phosphorus 3.6 2.5 - 4.6 mg/dL    Recent Results (from the past 240 hour(s))  SARS Coronavirus 2 by RT PCR (hospital order,  performed in [facility] hospital lab) Nasopharyngeal Nasopharyngeal Swab     Status: None   Collection Time: 06/25/20  2:41 PM   Specimen: Nasopharyngeal Swab  Result Value Ref Range Status   SARS Coronavirus 2 NEGATIVE NEGATIVE Final    Comment: (NOTE) SARS-CoV-2 target nucleic acids are NOT DETECTED.  The SARS-CoV-2 RNA is generally detectable in upper and lower respiratory specimens during the acute phase of infection. The lowest concentration of SARS-CoV-2 viral copies this assay can detect is 250 copies / mL. A negative result does not preclude SARS-CoV-2 infection and should not be used as the sole basis for treatment or other patient management decisions.  A negative result may occur with improper specimen collection / handling, submission of specimen other than nasopharyngeal swab, presence of viral mutation(s) within the areas targeted by this assay, and inadequate number of viral copies (<250 copies / mL). A negative result must be combined with clinical observations, patient history, and epidemiological information.  Fact Sheet for Patients:   BoilerBrush.com.cy  Fact Sheet for Healthcare Providers: https://pope.com/  This test is not yet approved or  cleared by  the [place] FDA and has been authorized for detection and/or diagnosis of SARS-CoV-2 by FDA under an Emergency Use Authorization (EUA).  This EUA will remain in effect (meaning this test can be used) for the duration of the COVID-19 declaration under Section 564(b)(1) of the Act, 21 U.S.C. section 360bbb-3(b)(1), unless the authorization is terminated or revoked sooner.  Performed at [facility], [address] [place], [place] [address]   Culture, blood (routine x 2) Call MD if unable to obtain prior to antibiotics being given     Status: None (Preliminary result)   Collection Time: 06/25/20  8:22 PM   Specimen: BLOOD  Result Value Ref Range Status   Specimen Description BLOOD SITE NOT SPECIFIED  Final   Special Requests   Final    BOTTLES DRAWN AEROBIC AND ANAEROBIC Blood Culture adequate volume   Culture   Final    NO GROWTH 4 DAYS Performed at [facility], [address] [place], [place] [address]    Report Status PENDING  Incomplete  Culture, blood (routine x 2) Call MD if unable to obtain prior to antibiotics being given     Status: None (Preliminary result)   Collection Time: 06/25/20  8:23 PM   Specimen: BLOOD RIGHT WRIST  Result Value Ref Range Status   Specimen Description BLOOD RIGHT WRIST  Final   Special Requests   Final    BOTTLES DRAWN AEROBIC AND ANAEROBIC Blood Culture adequate volume   Culture   Final    NO GROWTH 4 DAYS Performed at [facility], [address], [place], [place] [address]    Report Status PENDING  Incomplete  Surgical pcr screen     Status: None   Collection Time: 06/27/20  3:50 AM   Specimen: Nasal Mucosa; Nasal Swab  Result Value Ref Range Status   MRSA, PCR NEGATIVE NEGATIVE Final   Staphylococcus aureus NEGATIVE NEGATIVE Final    Comment: (NOTE) The Xpert SA Assay (FDA approved for NASAL specimens in patients 14 years of age and older), is one component of a comprehensive surveillance program. It is not  intended to diagnose infection nor to guide or monitor treatment. Performed at [facility], [address], [place], [place] [address]      Radiology Studies: No results found. DG C-Arm 1-60 Min  Final  Result    DG HIP OPERATIVE UNILAT WITH PELVIS LEFT  Final Result    DG Hip Unilat With Pelvis 2-3 Views Left  Final Result    DG Chest 1 View  Final Result       Scheduled Meds: . acetaminophen  1,000 mg Oral TID  . aspirin EC  325 mg Oral Q breakfast  . docusate sodium  100 mg Oral BID  . enoxaparin (LOVENOX) injection  30 mg Subcutaneous Q24H  . feeding supplement (ENSURE ENLIVE)  237 mL Oral TID BM  . feeding supplement  296 mL Oral Once  . folic acid  1 mg Oral Daily  . mometasone-formoterol  2 puff Inhalation BID  . multivitamin with minerals  1 tablet Oral Daily  . nicotine  21 mg Transdermal Daily  . pantoprazole  40 mg Oral BID  . povidone-iodine  2 application Topical Once  . thiamine  100 mg Oral Daily   Or  . thiamine  100 mg Intravenous Daily   PRN Meds: albuterol, cyclobenzaprine, menthol-cetylpyridinium **OR** phenol, metoCLOPramide **OR** metoCLOPramide (REGLAN) injection, naphazoline-glycerin, ondansetron **OR** ondansetron (ZOFRAN) IV, oxyCODONE Continuous Infusions: . azithromycin 500 mg (06/29/20 1803)  . cefTRIAXone (ROCEPHIN)  IV 1 g (06/29/20 1604)  . lactated ringers 10 mL/hr at 06/27/20 1507      LOS: 5 days  Time spent: Greater than 50% of the 35 minute visit was spent in counseling/coordination of care for the patient as laid out in the A&P.   Lewie Chamber, MD [facility] 06/30/2020, 9:44 AM   Contact via secure chat.  To contact the attending provider between 7A-7P or the covering provider during after hours 7P-7A, please log into the web site [email or web address] and access using universal [place] password for that web site. If you do not have the password, please call the hospital operator.

## 2020-06-30 NOTE — Progress Notes (Signed)
Nutrition Follow-up  DOCUMENTATION CODES:   Underweight, Severe malnutrition in context of chronic illness  INTERVENTION:   -Continue Ensure Enlive po TID, each supplement provides 350 kcal and 20 grams of protein -Continue MVI with minerals daily -Hormel Shake BID with meals, each supplement provides 520 kcals and 22 grams protein  NUTRITION DIAGNOSIS:   Severe Malnutrition related to chronic illness (COPD) as evidenced by moderate fat depletion, severe fat depletion, severe muscle depletion, energy intake < or equal to 75% for > or equal to 1 month.  Ongoing  GOAL:   Patient will meet greater than or equal to 90% of their needs  Progressing   MONITOR:   PO intake, Supplement acceptance, Diet advancement, Labs, Weight trends, Skin, I & O's  REASON FOR ASSESSMENT:   Consult Assessment of nutrition requirement/status  ASSESSMENT:   Kristine Roberts is a 64 y.o. female with medical history significant of COPD not on home O2 but still smoking, ongoing EtOH abuse, last drink was yesterday, PUD, severe malnutrition, osteoporosis.  7/23- s/p PROCEDURE: Treatment of intertrochanteric, pertrochanteric, subtrochanteric fracture with intramedullary implant.  Reviewed I/O's: +240 ml x 24 hours and +1.1 L since admission  Attempted to speak with pt via phone, however, no answer.   Pt with continues decreased oral intake; noted meal completion 25%. Pt has been consuming her Ensure Enlive supplements per MAR.   Per chart review, pt amenable to SNF placement at discharge.   Medications reviewed and include colace and thiamine.   Labs reviewed.   Diet Order:   Diet Order            Diet regular Room service appropriate? Yes; Fluid consistency: Thin  Diet effective now                 EDUCATION NEEDS:   Education needs have been addressed  Skin:  Skin Assessment: Skin Integrity Issues: Skin Integrity Issues:: Incisions Incisions: closed lt hip  Last BM:   06/29/20  Height:   Ht Readings from Last 1 Encounters:  06/27/20 5\' 4"  (1.626 m)    Weight:   Wt Readings from Last 1 Encounters:  06/27/20 (!) 31.5 kg    Ideal Body Weight:  45.5 kg  BMI:  Body mass index is 11.92 kg/m.  Estimated Nutritional Needs:   Kcal:  1650-1850  Protein:  80-95 grams  Fluid:  > 1.6 L    06/29/20, RD, LDN, CDCES Registered Dietitian II Certified Diabetes Care and Education Specialist Please refer to [facility] for RD and/or RD on-call/weekend/after hours pager

## 2020-07-01 DIAGNOSIS — S72002A Fracture of unspecified part of neck of left femur, initial encounter for closed fracture: Secondary | ICD-10-CM | POA: Diagnosis not present

## 2020-07-01 LAB — CBC WITH DIFFERENTIAL/PLATELET
Abs Immature Granulocytes: 0.02 10*3/uL (ref 0.00–0.07)
Basophils Absolute: 0.1 10*3/uL (ref 0.0–0.1)
Basophils Relative: 1 %
Eosinophils Absolute: 0.3 10*3/uL (ref 0.0–0.5)
Eosinophils Relative: 5 %
HCT: 27.4 % — ABNORMAL LOW (ref 36.0–46.0)
Hemoglobin: 8.5 g/dL — ABNORMAL LOW (ref 12.0–15.0)
Immature Granulocytes: 0 %
Lymphocytes Relative: 21 %
Lymphs Abs: 1.3 10*3/uL (ref 0.7–4.0)
MCH: 29.3 pg (ref 26.0–34.0)
MCHC: 31 g/dL (ref 30.0–36.0)
MCV: 94.5 fL (ref 80.0–100.0)
Monocytes Absolute: 0.9 10*3/uL (ref 0.1–1.0)
Monocytes Relative: 14 %
Neutro Abs: 3.6 10*3/uL (ref 1.7–7.7)
Neutrophils Relative %: 59 %
Platelets: 242 10*3/uL (ref 150–400)
RBC: 2.9 MIL/uL — ABNORMAL LOW (ref 3.87–5.11)
RDW: 16.2 % — ABNORMAL HIGH (ref 11.5–15.5)
WBC: 6.2 10*3/uL (ref 4.0–10.5)
nRBC: 0 % (ref 0.0–0.2)

## 2020-07-01 LAB — BASIC METABOLIC PANEL
Anion gap: 6 (ref 5–15)
BUN: 10 mg/dL (ref 8–23)
CO2: 29 mmol/L (ref 22–32)
Calcium: 8.1 mg/dL — ABNORMAL LOW (ref 8.9–10.3)
Chloride: 105 mmol/L (ref 98–111)
Creatinine, Ser: 0.4 mg/dL — ABNORMAL LOW (ref 0.44–1.00)
GFR calc Af Amer: >60 mL/min (ref >60)
GFR calc non Af Amer: >60 mL/min (ref >60)
Glucose, Bld: 94 mg/dL (ref 70–99)
Potassium: 3.9 mmol/L (ref 3.5–5.1)
Sodium: 140 mmol/L (ref 135–145)

## 2020-07-01 LAB — PHOSPHORUS: Phosphorus: 3.7 mg/dL (ref 2.5–4.6)

## 2020-07-01 LAB — MAGNESIUM: Magnesium: 1.9 mg/dL (ref 1.7–2.4)

## 2020-07-01 NOTE — NC FL2 (Signed)
MEDICAID FL2 LEVEL OF CARE SCREENING TOOL     IDENTIFICATION  Patient Name: Kristine Roberts Birthdate: Jun 30, 1956 Sex: female Admission Date (Current Location): 06/25/2020  [facility] and [place] Number:  Producer, television/film/video and Address:  [facility] [place], [address], [place], [place] [address]      Provider Number: [id number]  Attending Physician Name and Address:  Salomon Mast*  Relative Name and Phone Number:  Roland Earl - [phone]    Current Level of Care: SNF Recommended Level of Care: Skilled Nursing Facility Prior Approval Number:    Date Approved/Denied:   PASRR Number: [id number]  Discharge Plan: SNF    Current Diagnoses: Patient Active Problem List   Diagnosis Date Noted  . Community acquired pneumonia of left upper lobe of lung 06/25/2020  . Closed left hip fracture, initial encounter (HCC) 06/25/2020  . RBBB 06/25/2020  . Dehydration 08/22/2019  . Nausea 08/20/2019  . Abnormal liver function 08/20/2019  . Hyponatremia 08/20/2019  . Closed right hip fracture (HCC) 07/08/2018  . Cellulitis of both lower extremities 07/08/2018  . Gastric ulcer 01/11/2018  . Hypokalemia 12/26/2017  . Cachexia (HCC) 12/26/2017  . Hypotension 12/26/2017  . Tobacco abuse 12/26/2017  . ETOH abuse 12/26/2017  . Anemia 12/26/2017  . Pressure injury of skin 12/26/2017  . Protein-calorie malnutrition, severe 03/05/2016  . Gastric outlet obstruction 03/03/2016  . COPD (chronic obstructive pulmonary disease) (HCC) 03/03/2016  . Dysphagia 02/03/2016    Orientation RESPIRATION BLADDER Height & Weight     Self, Time, Situation, Place  O2 (1 liter/ [place]) Incontinent (Incontinent at times) Weight: (!) 31.5 kg Height:  5\' 4"  (162.6 cm)  BEHAVIORAL SYMPTOMS/MOOD NEUROLOGICAL BOWEL NUTRITION STATUS      Continent Diet (See Discharge Summary)  AMBULATORY STATUS COMMUNICATION OF NEEDS Skin   Extensive Assist Verbally Surgical wounds,  Bruising                       Personal Care Assistance Level of Assistance  Bathing, Dressing Bathing Assistance: Limited assistance   Dressing Assistance: Limited assistance     Functional Limitations Info  Sight, Hearing, Speech Sight Info: Adequate Hearing Info: Adequate Speech Info: Adequate    SPECIAL CARE FACTORS FREQUENCY  PT (By licensed PT), OT (By licensed OT), Speech therapy     PT Frequency: 5 x per week OT Frequency: 5 x per week     Speech Therapy Frequency: pending need      Contractures Contractures Info: Not present    Additional Factors Info  Code Status, Allergies Code Status Info: full code Allergies Info: penicillin           Current Medications (07/01/2020):  This is the current hospital active medication list Current Facility-Administered Medications  Medication Dose Route Frequency Provider Last Rate Last Admin  . acetaminophen (TYLENOL) tablet 1,000 mg  1,000 mg Oral TID 07/03/2020, MD   1,000 mg at 07/01/20 0957  . albuterol (PROVENTIL) (2.5 MG/3ML) 0.083% nebulizer solution 2.5 mg  2.5 mg Inhalation Q6H PRN 07/03/20, MD      . aspirin EC tablet 325 mg  325 mg Oral Q breakfast Yolonda Kida, MD   325 mg at 07/01/20 0957  . azithromycin (ZITHROMAX) tablet 500 mg  500 mg Oral q1800 07/03/20, MD   500 mg at 06/30/20 1830  . cefTRIAXone (ROCEPHIN) 1 g in sodium chloride 0.9 % 100 mL IVPB  1 g Intravenous  Q24H Yolonda Kida, MD 200 mL/hr at 06/30/20 1651 1 g at 06/30/20 1651  . cyclobenzaprine (FLEXERIL) tablet 10 mg  10 mg Oral TID PRN Yolonda Kida, MD   10 mg at 06/30/20 2127  . docusate sodium (COLACE) capsule 100 mg  100 mg Oral BID Yolonda Kida, MD   100 mg at 07/01/20 0957  . enoxaparin (LOVENOX) injection 30 mg  30 mg Subcutaneous Q24H Lewie Chamber, MD   30 mg at 06/30/20 1830  . feeding supplement (ENSURE ENLIVE) (ENSURE ENLIVE) liquid 237 mL  237 mL Oral TID BM Yolonda Kida, MD   237 mL at 07/01/20 0959  . feeding supplement (ENSURE PRE-SURGERY) liquid 296 mL  296 mL Oral Once Yolonda Kida, MD      . folic acid (FOLVITE) tablet 1 mg  1 mg Oral Daily Yolonda Kida, MD   1 mg at 07/01/20 0957  . lactated ringers infusion   Intravenous Continuous Yolonda Kida, MD 10 mL/hr at 06/27/20 1507 Restarted at 06/27/20 1523  . menthol-cetylpyridinium (CEPACOL) lozenge 3 mg  1 lozenge Oral PRN Yolonda Kida, MD       Or  . phenol [facility]) mouth spray 1 spray  1 spray Mouth/Throat PRN Yolonda Kida, MD      . metoCLOPramide [facility]) tablet 5-10 mg  5-10 mg Oral Q8H PRN Yolonda Kida, MD       Or  . metoCLOPramide [facility]) injection 5-10 mg  5-10 mg Intravenous Q8H PRN Yolonda Kida, MD      . mometasone-formoterol [facility]) 100-5 MCG/ACT inhaler 2 puff  2 puff Inhalation BID Yolonda Kida, MD   2 puff at 07/01/20 0815  . multivitamin with minerals tablet 1 tablet  1 tablet Oral Daily Yolonda Kida, MD   1 tablet at 07/01/20 0957  . naphazoline-glycerin (CLEAR EYES REDNESS) ophth solution 1 drop  1 drop Both Eyes QID PRN Yolonda Kida, MD      . nicotine (NICODERM CQ - dosed in mg/24 hours) patch 21 mg  21 mg Transdermal Daily Yolonda Kida, MD   21 mg at 07/01/20 0959  . ondansetron (ZOFRAN) tablet 4 mg  4 mg Oral Q6H PRN Yolonda Kida, MD   4 mg at 06/29/20 1353   Or  . ondansetron [facility]) injection 4 mg  4 mg Intravenous Q6H PRN Yolonda Kida, MD   4 mg at 06/28/20 1824  . oxyCODONE (Oxy IR/ROXICODONE) immediate release tablet 5 mg  5 mg Oral Q4H PRN Lewie Chamber, MD   5 mg at 07/01/20 0602  . pantoprazole (PROTONIX) EC tablet 40 mg  40 mg Oral BID Yolonda Kida, MD   40 mg at 07/01/20 0957  . povidone-iodine 10 % swab 2 application  2 application Topical Once Yolonda Kida, MD      . thiamine tablet 100 mg  100 mg Oral Daily Yolonda Kida, MD   100 mg at 07/01/20 8416     Discharge Medications: Please see discharge summary for a list of discharge medications.  Relevant Imaging Results:  Relevant Lab Results:   Additional Information SSN: [id number], [place]

## 2020-07-01 NOTE — Progress Notes (Signed)
Physical Therapy Treatment Patient Details Name: Kristine Roberts MRN: [id number] DOB: [birth year] Today's Date: 07/01/2020    History of Present Illness Kristine Roberts is a 64 y.o. female s/p fall at home with l hip fx and underwent IM nail on 7/23.  PMH significant for but not minited to COPD not on home O2 but still smoking, EtOH abuse, PUD, severe malnutrition, osteoporosis and r hip fx.    PT Comments    Pt continues to progress mobility but remains to require min - min guard assistance.  Will continue to recommend SNF placement to improve functional indpendence before returning home.     Follow Up Recommendations  SNF;Supervision for mobility/OOB     Equipment Recommendations  None recommended by PT    Recommendations for Other Services       Precautions / Restrictions Precautions Precautions: Fall Restrictions Weight Bearing Restrictions: Yes LLE Weight Bearing: Weight bearing as tolerated    Mobility  Bed Mobility Overal bed mobility: Needs Assistance Bed Mobility: Supine to Sit;Sit to Supine     Supine to sit: Supervision Sit to supine: Min assist   General bed mobility comments: Min assistance to move LE back to bed.  Transfers Overall transfer level: Needs assistance Equipment used: Rolling walker (2 wheeled) Transfers: Sit to/from Stand Sit to Stand: Min guard Stand pivot transfers: +2 physical assistance       General transfer comment: Cues for hand placement to and from seated surface.  Ambulation/Gait Ambulation/Gait assistance: Min guard Gait Distance (Feet): 100 Feet Assistive device: Rolling walker (2 wheeled) Gait Pattern/deviations: Step-to pattern;Shuffle;Trunk flexed;Antalgic     General Gait Details: Cues for L knee extension and L heel strike.  Pt with antalgic gt but able to progress distance and responded well to corrections.   Stairs             Wheelchair Mobility    Modified Rankin (Stroke Patients Only)        Balance Overall balance assessment: Needs assistance   Sitting balance-Leahy Scale: Fair       Standing balance-Leahy Scale: Poor                              Cognition Arousal/Alertness: Awake/alert Behavior During Therapy: WFL for tasks assessed/performed Overall Cognitive Status: Within Functional Limits for tasks assessed                                        Exercises General Exercises - Lower Extremity Ankle Circles/Pumps: AROM;Both;Supine;20 reps Quad Sets: AROM;Left;10 reps;Supine Heel Slides: AAROM;Left;10 reps;Supine Hip ABduction/ADduction: AAROM;Left;10 reps;Supine    General Comments        Pertinent Vitals/Pain Pain Assessment: 0-10 Pain Score: 6  Pain Descriptors / Indicators: Constant;Grimacing;Guarding Pain Intervention(s): Monitored during session;Repositioned    Home Living                      Prior Function            PT Goals (current goals can now be found in the care plan section) Acute Rehab PT Goals Patient Stated Goal: to get OOB tomorrow Potential to Achieve Goals: Good Progress towards PT goals: Progressing toward goals    Frequency    Min 4X/week      PT Plan Current plan remains appropriate    Co-evaluation  AM-PAC PT "6 Clicks" Mobility   Outcome Measure  Help needed turning from your back to your side while in a flat bed without using bedrails?: A Little Help needed moving from lying on your back to sitting on the side of a flat bed without using bedrails?: A Little Help needed moving to and from a bed to a chair (including a wheelchair)?: A Little Help needed standing up from a chair using your arms (e.g., wheelchair or bedside chair)?: A Little Help needed to walk in hospital room?: A Little Help needed climbing 3-5 steps with a railing? : A Little 6 Click Score: 18    End of Session Equipment Utilized During Treatment: Gait belt Activity Tolerance:  Patient limited by pain Patient left: in chair;with chair alarm set;with call bell/phone within reach Nurse Communication: Mobility status PT Visit Diagnosis: Other abnormalities of gait and mobility (R26.89)     Time: 1346-1406 PT Time Calculation (min) (ACUTE ONLY): 20 min  Charges:  $Gait Training: 8-22 mins                     Bonney Leitz , PTA Acute Rehabilitation Services Pager [phone] Office [phone]     Merel Santoli Artis Delay 07/01/2020, 2:13 PM

## 2020-07-01 NOTE — Plan of Care (Signed)
  Problem: Education: Goal: Knowledge of General Education information will improve Description Including pain rating scale, medication(s)/side effects and non-pharmacologic comfort measures Outcome: Progressing   Problem: Nutrition: Goal: Adequate nutrition will be maintained Outcome: Progressing   Problem: Coping: Goal: Level of anxiety will decrease Outcome: Progressing   Problem: Elimination: Goal: Will not experience complications related to urinary retention Outcome: Progressing   Problem: Safety: Goal: Ability to remain free from injury will improve Outcome: Progressing   

## 2020-07-01 NOTE — Progress Notes (Signed)
PROGRESS NOTE    Kristine Roberts   BPZ:[id number]  DOB: 1956-11-13  DOA: 06/25/2020     6  PCP: [facility]  CC: fall at home  Hospital Course: 64 year old female with COPD and ongoing smoking, alcohol use, PUD, protein calorie malnutrition and osteoporosis was admitted 7/21 with CAP and subsequent mechanical fall resulting in left hip fracture.  Patient was treated with ceftriaxone and azithromycin started on 06/25/2020.  She underwent ORIF on 06/27/2020 and tolerated this well.  PT recommended discharge to SNF however patient was initially reluctant although is now agreeable to this.   Interval History:  Patient feels that she is doing reasonably well.  Notes her breathing is much better.  Has been working with PT and is appreciative of the work they have been doing with her.  She is agreeable to go to rehab.  States she lives at home with her boyfriend and would like to return home as soon as possible however.  Old records reviewed in assessment of this patient  ROS: Constitutional: negative for chills and fevers, Respiratory: positive for cough, Cardiovascular: negative for chest pain and Gastrointestinal: negative for abdominal pain  Assessment & Plan:  CAP of left upper lobe Patient is improving with present management, feels clinically improved lungs sound okay. Continue azithromycin, last dose tomorrow 07/02/2020 Continue ceftriaxone, today is day #5, started 06/26/2020  Left hip fracture Status post IM fixation 06/27/2020 per orthopedics Pain is reasonably controlled On Lovenox per DVT prophylaxis Awaiting placement at SNF Continues to work with PT in house  COPD No evidence of acute flare Continue present management Patient has been advised to stop smoking many times  EtOH use Patient had no complicated withdrawal during this hospital stay Patient advised to stop alcohol use  Gastric ulcer Continue PPI Further management per  outpatient    Antimicrobials: Rocephin 06/26/2020>> present Azithromycin 06/28/2020>> present  DVT prophylaxis: Lovenox Code Status: Full Family Communication: Significant other  Disposition Plan:  Status is: Inpatient  Remains inpatient appropriate because:Ongoing active pain requiring inpatient pain management, IV treatments appropriate due to intensity of illness or inability to take PO and Inpatient level of care appropriate due to severity of illness   Dispo: The patient is from: Home              Anticipated d/c is to: SNF              Anticipated d/c date is: When SNF bed is available              Patient currently is medically stable for discharge  Objective: Blood pressure (!) 99/61, pulse 76, temperature 98.6 F (37 C), temperature source Oral, resp. rate 16, height 5\' 4"  (1.626 m), weight (!) 31.5 kg, SpO2 97 %.  Examination: General appearance: Spry extremely thin female sitting up in recliner in no acute distress, friendly and chatty. Head: Normocephalic, without obvious abnormality, atraumatic Eyes: EOMI Lungs: Reasonable but diminished air entry bilaterally with some rales at right base.  No wheezes noted. Heart: regular rate and rhythm and S1, S2 normal Abdomen: Emaciated, scaphoid, NABS, nontender Extremities: Thin.  Surgical dressings in place over right hip.  Compartments soft.  No oozing or weeping around dressing sites. Mild swelling appreciated in left thigh this morning Skin: Warm, dry, intact Neurologic: Grossly normal   Consultants:   Orthopedic surgery  Procedures:   IM nail fixation, left hip, 06/27/2020  Data Reviewed: I have personally reviewed following labs and imaging studies Results  for orders placed or performed during the hospital encounter of 06/25/20 (from the past 24 hour(s))  Basic metabolic panel     Status: Abnormal   Collection Time: 07/01/20  3:41 AM  Result Value Ref Range   Sodium 140 135 - 145 mmol/L   Potassium 3.9 3.5 -  5.1 mmol/L   Chloride 105 98 - 111 mmol/L   CO2 29 22 - 32 mmol/L   Glucose, Bld 94 70 - 99 mg/dL   BUN 10 8 - 23 mg/dL   Creatinine, Ser 5.17 (L) 0.44 - 1.00 mg/dL   Calcium 8.1 (L) 8.9 - 10.3 mg/dL   GFR calc non Af Amer >60 >60 mL/min   GFR calc Af Amer >60 >60 mL/min   Anion gap 6 5 - 15  CBC with Differential/Platelet     Status: Abnormal   Collection Time: 07/01/20  3:41 AM  Result Value Ref Range   WBC 6.2 4.0 - 10.5 K/uL   RBC 2.90 (L) 3.87 - 5.11 MIL/uL   Hemoglobin 8.5 (L) 12.0 - 15.0 g/dL   HCT 61.6 (L) 36 - 46 %   MCV 94.5 80.0 - 100.0 fL   MCH 29.3 26.0 - 34.0 pg   MCHC 31.0 30.0 - 36.0 g/dL   RDW 07.3 (H) 71.0 - 62.6 %   Platelets 242 150 - 400 K/uL   nRBC 0.0 0.0 - 0.2 %   Neutrophils Relative % 59 %   Neutro Abs 3.6 1.7 - 7.7 K/uL   Lymphocytes Relative 21 %   Lymphs Abs 1.3 0.7 - 4.0 K/uL   Monocytes Relative 14 %   Monocytes Absolute 0.9 0 - 1 K/uL   Eosinophils Relative 5 %   Eosinophils Absolute 0.3 0 - 0 K/uL   Basophils Relative 1 %   Basophils Absolute 0.1 0 - 0 K/uL   Immature Granulocytes 0 %   Abs Immature Granulocytes 0.02 0.00 - 0.07 K/uL  Magnesium     Status: None   Collection Time: 07/01/20  3:41 AM  Result Value Ref Range   Magnesium 1.9 1.7 - 2.4 mg/dL  Phosphorus     Status: None   Collection Time: 07/01/20  3:41 AM  Result Value Ref Range   Phosphorus 3.7 2.5 - 4.6 mg/dL    Recent Results (from the past 240 hour(s))  SARS Coronavirus 2 by RT PCR (hospital order, performed in [facility] hospital lab) Nasopharyngeal Nasopharyngeal Swab     Status: None   Collection Time: 06/25/20  2:41 PM   Specimen: Nasopharyngeal Swab  Result Value Ref Range Status   SARS Coronavirus 2 NEGATIVE NEGATIVE Final    Comment: (NOTE) SARS-CoV-2 target nucleic acids are NOT DETECTED.  The SARS-CoV-2 RNA is generally detectable in upper and lower respiratory specimens during the acute phase of infection. The lowest concentration of SARS-CoV-2 viral  copies this assay can detect is 250 copies / mL. A negative result does not preclude SARS-CoV-2 infection and should not be used as the sole basis for treatment or other patient management decisions.  A negative result may occur with improper specimen collection / handling, submission of specimen other than nasopharyngeal swab, presence of viral mutation(s) within the areas targeted by this assay, and inadequate number of viral copies (<250 copies / mL). A negative result must be combined with clinical observations, patient history, and epidemiological information.  Fact Sheet for Patients:   BoilerBrush.com.cy  Fact Sheet for Healthcare Providers: https://pope.com/  This test is not yet  approved or  cleared by the [place] and has been authorized for detection and/or diagnosis of SARS-CoV-2 by FDA under an Emergency Use Authorization (EUA).  This EUA will remain in effect (meaning this test can be used) for the duration of the COVID-19 declaration under Section 564(b)(1) of the Act, 21 U.S.C. section 360bbb-3(b)(1), unless the authorization is terminated or revoked sooner.  Performed at [facility], [address] [place], [place] [address]   Culture, blood (routine x 2) Call MD if unable to obtain prior to antibiotics being given     Status: None   Collection Time: 06/25/20  8:22 PM   Specimen: BLOOD  Result Value Ref Range Status   Specimen Description BLOOD SITE NOT SPECIFIED  Final   Special Requests   Final    BOTTLES DRAWN AEROBIC AND ANAEROBIC Blood Culture adequate volume   Culture   Final    NO GROWTH 5 DAYS Performed at [facility], [address] [place], [place] [address]    Report Status 06/30/2020 FINAL  Final  Culture, blood (routine x 2) Call MD if unable to obtain prior to antibiotics being given     Status: None   Collection Time: 06/25/20  8:23 PM   Specimen: BLOOD RIGHT WRIST  Result  Value Ref Range Status   Specimen Description BLOOD RIGHT WRIST  Final   Special Requests   Final    BOTTLES DRAWN AEROBIC AND ANAEROBIC Blood Culture adequate volume   Culture   Final    NO GROWTH 5 DAYS Performed at [facility], [address] [place], [place] [address]    Report Status 06/30/2020 FINAL  Final  Surgical pcr screen     Status: None   Collection Time: 06/27/20  3:50 AM   Specimen: Nasal Mucosa; Nasal Swab  Result Value Ref Range Status   MRSA, PCR NEGATIVE NEGATIVE Final   Staphylococcus aureus NEGATIVE NEGATIVE Final    Comment: (NOTE) The Xpert SA Assay (FDA approved for NASAL specimens in patients 72 years of age and older), is one component of a comprehensive surveillance program. It is not intended to diagnose infection nor to guide or monitor treatment. Performed at [facility], [address], [place], [place] [address]      Radiology Studies: No results found. DG C-Arm 1-60 Min  Final Result    DG HIP OPERATIVE UNILAT WITH PELVIS LEFT  Final Result    DG Hip Unilat With Pelvis 2-3 Views Left  Final Result    DG Chest 1 View  Final Result       Scheduled Meds: . acetaminophen  1,000 mg Oral TID  . aspirin EC  325 mg Oral Q breakfast  . azithromycin  500 mg Oral q1800  . docusate sodium  100 mg Oral BID  . enoxaparin (LOVENOX) injection  30 mg Subcutaneous Q24H  . feeding supplement (ENSURE ENLIVE)  237 mL Oral TID BM  . feeding supplement  296 mL Oral Once  . folic acid  1 mg Oral Daily  . mometasone-formoterol  2 puff Inhalation BID  . multivitamin with minerals  1 tablet Oral Daily  . nicotine  21 mg Transdermal Daily  . pantoprazole  40 mg Oral BID  . povidone-iodine  2 application Topical Once  . thiamine  100 mg Oral Daily   PRN Meds: albuterol, cyclobenzaprine, menthol-cetylpyridinium **OR** phenol, metoCLOPramide **OR** metoCLOPramide (REGLAN) injection, naphazoline-glycerin, ondansetron **OR** ondansetron  (ZOFRAN) IV,  oxyCODONE Continuous Infusions: . cefTRIAXone (ROCEPHIN)  IV 1 g (06/30/20 1651)  . lactated ringers 10 mL/hr at 06/27/20 1507      LOS: 6 days  Time spent: Greater than 50% of the 35 minute visit was spent in counseling/coordination of care for the patient as laid out in the A&P.   Pieter Partridge, MD [facility] 07/01/2020, 1:59 PM   Contact via secure chat.  To contact the attending provider between 7A-7P or the covering provider during after hours 7P-7A, please log into the web site [email or web address] and access using universal [place] password for that web site. If you do not have the password, please call the hospital operator.

## 2020-07-01 NOTE — TOC Initial Note (Addendum)
Transition of Care [facility]) - Initial/Assessment Note    Patient Details  Name: Kristine Roberts MRN: [id number] Date of Birth: 05-23-1956  Transition of Care [facility]) CM/SW Contact:    Curlene Labrum, RN Phone Number: 07/01/2020, 11:52 AM  Clinical Narrative:                 Case management met with the patient and significant other, Dellis Filbert, S/P fall and IM nail of left hip fracture on 7/23.  Patient would like to go to a SNF for rehab and would like to transfer via car to the facility of choice.  Patient given medicare choice regarding SNF facility and patient chose 1. [facility], 2. [facility], and 3. [place].  Patient had COVID vaccine x2 through [facility]. CAGE assessment completed.  Patient denies use of O2 at home.  Will continue to follow for transfer to a SNF.  FL2 completed.  Called [facility] and left a message with Verner Mould to fax clinicals since facility is not in the hub.  7/27- contacted [facility] for SNF placement - clinicals faxed to [place] at [phone].  Expected Discharge Plan: Skilled Nursing Facility Barriers to Discharge: Continued Medical Work up   Patient Goals and CMS Choice Patient states their goals for this hospitalization and ongoing recovery are:: To go to a SNF for rehabilitation CMS Medicare.gov Compare Post Acute Care list provided to:: Patient Choice offered to / list presented to : Patient  Expected Discharge Plan and Services Expected Discharge Plan: [place]   Discharge Planning Services: CM Consult Post Acute Care Choice: [place] Living arrangements for the past 2 months: Single Family Home                                      Prior Living Arrangements/Services Living arrangements for the past 2 months: Single Family Home Lives with:: Domestic Partner Patient language and need for interpreter reviewed:: Yes Do you feel safe going back to the place where you live?: Yes       Need for Family Participation in Patient Care: Yes (Comment) Care giver support system in place?: Yes (comment) Current home services:  (currently does not wear O2 at home, cane, rolling walker) Criminal Activity/Legal Involvement Pertinent to Current Situation/Hospitalization: No - Comment as needed  Activities of Daily Living Home Assistive Devices/Equipment: None ADL Screening (condition at time of admission) Patient's cognitive ability adequate to safely complete daily activities?: No Is the patient deaf or have difficulty hearing?: No Does the patient have difficulty seeing, even when wearing glasses/contacts?: No Does the patient have difficulty concentrating, remembering, or making decisions?: No Patient able to express need for assistance with ADLs?: Yes Does the patient have difficulty dressing or bathing?: Yes Independently performs ADLs?: No Does the patient have difficulty walking or climbing stairs?: Yes Weakness of Legs: Both Weakness of Arms/Hands: None  Permission Sought/Granted Permission sought to share information with : Case Manager Permission granted to share information with : Yes, Verbal Permission Granted     Permission granted to share info w AGENCY: SNF facility  Permission granted to share info w Relationship: Dellis Filbert - [phone]     Emotional Assessment Appearance:: Appears stated age Attitude/Demeanor/Rapport: Gracious Affect (typically observed): Accepting Orientation: : Oriented to Self, Oriented to Place, Oriented to  Time, Oriented to Situation Alcohol / Substance Use: Tobacco Use, Alcohol Use Psych Involvement: No (comment)  Admission diagnosis:  Hip injury [S79.919A] Fall, initial encounter [W19.XXXA] Closed left hip fracture, initial encounter ([place]) [S72.002A] Displaced intertrochanteric fracture of left femur, initial encounter for closed fracture [facility]) [S72.142A] Patient Active Problem List   Diagnosis Date Noted  . Community  acquired pneumonia of left upper lobe of lung 06/25/2020  . Closed left hip fracture, initial encounter ([place]) 06/25/2020  . RBBB 06/25/2020  . Dehydration 08/22/2019  . Nausea 08/20/2019  . Abnormal liver function 08/20/2019  . Hyponatremia 08/20/2019  . Closed right hip fracture ([place]) 07/08/2018  . Cellulitis of both lower extremities 07/08/2018  . Gastric ulcer 01/11/2018  . Hypokalemia 12/26/2017  . Cachexia ([place]) 12/26/2017  . Hypotension 12/26/2017  . Tobacco abuse 12/26/2017  . ETOH abuse 12/26/2017  . Anemia 12/26/2017  . Pressure injury of skin 12/26/2017  . Protein-calorie malnutrition, severe 03/05/2016  . Gastric outlet obstruction 03/03/2016  . COPD (chronic obstructive pulmonary disease) ([place]) 03/03/2016  . Dysphagia 02/03/2016   PCP:  Sandria [place]:   [facility] [address] [place] [address] Phone: [phone] Fax: [phone]     Social Determinants of Health (SDOH) Interventions    Readmission Risk Interventions Readmission Risk Prevention Plan 07/01/2020 08/31/2019  Transportation Screening Complete Complete  PCP or Specialist Appt within 5-7 Days - Not Complete  Not Complete comments - SNF MD will follow up  PCP or Specialist Appt within 3-5 Days Complete -  Home Care Screening - Not Complete  Home Care Screening Not Completed Comments - Going to SNF  Medication Review (RN CM) - Complete  HRI or Home Care Consult Complete -  Social Work Consult for Recovery Care Planning/Counseling Complete -  Palliative Care Screening Complete -  Medication Review Press photographer) Complete -  Some recent data might be hidden

## 2020-07-02 DIAGNOSIS — Z9889 Other specified postprocedural states: Secondary | ICD-10-CM

## 2020-07-02 DIAGNOSIS — K9189 Other postprocedural complications and disorders of digestive system: Secondary | ICD-10-CM

## 2020-07-02 DIAGNOSIS — S72002A Fracture of unspecified part of neck of left femur, initial encounter for closed fracture: Secondary | ICD-10-CM | POA: Diagnosis not present

## 2020-07-02 DIAGNOSIS — R112 Nausea with vomiting, unspecified: Secondary | ICD-10-CM

## 2020-07-02 DIAGNOSIS — R1110 Vomiting, unspecified: Secondary | ICD-10-CM

## 2020-07-02 LAB — RETICULOCYTES
Immature Retic Fract: 26.5 % — ABNORMAL HIGH (ref 2.3–15.9)
RBC.: 2.65 MIL/uL — ABNORMAL LOW (ref 3.87–5.11)
Retic Count, Absolute: 41.9 10*3/uL (ref 19.0–186.0)
Retic Ct Pct: 1.6 % (ref 0.4–3.1)

## 2020-07-02 LAB — CBC WITH DIFFERENTIAL/PLATELET
Abs Immature Granulocytes: 0.02 10*3/uL (ref 0.00–0.07)
Basophils Absolute: 0 10*3/uL (ref 0.0–0.1)
Basophils Relative: 1 %
Eosinophils Absolute: 0.2 10*3/uL (ref 0.0–0.5)
Eosinophils Relative: 4 %
HCT: 25.9 % — ABNORMAL LOW (ref 36.0–46.0)
Hemoglobin: 8.1 g/dL — ABNORMAL LOW (ref 12.0–15.0)
Immature Granulocytes: 0 %
Lymphocytes Relative: 23 %
Lymphs Abs: 1.3 10*3/uL (ref 0.7–4.0)
MCH: 29.5 pg (ref 26.0–34.0)
MCHC: 31.3 g/dL (ref 30.0–36.0)
MCV: 94.2 fL (ref 80.0–100.0)
Monocytes Absolute: 0.7 10*3/uL (ref 0.1–1.0)
Monocytes Relative: 13 %
Neutro Abs: 3.3 10*3/uL (ref 1.7–7.7)
Neutrophils Relative %: 59 %
Platelets: 265 10*3/uL (ref 150–400)
RBC: 2.75 MIL/uL — ABNORMAL LOW (ref 3.87–5.11)
RDW: 16.2 % — ABNORMAL HIGH (ref 11.5–15.5)
WBC: 5.5 10*3/uL (ref 4.0–10.5)
nRBC: 0 % (ref 0.0–0.2)

## 2020-07-02 LAB — IRON AND TIBC
Iron: 17 ug/dL — ABNORMAL LOW (ref 28–170)
Saturation Ratios: 8 % — ABNORMAL LOW (ref 10.4–31.8)
TIBC: 218 ug/dL — ABNORMAL LOW (ref 250–450)
UIBC: 201 ug/dL

## 2020-07-02 LAB — BASIC METABOLIC PANEL
Anion gap: 7 (ref 5–15)
BUN: 12 mg/dL (ref 8–23)
CO2: 29 mmol/L (ref 22–32)
Calcium: 8.2 mg/dL — ABNORMAL LOW (ref 8.9–10.3)
Chloride: 104 mmol/L (ref 98–111)
Creatinine, Ser: 0.38 mg/dL — ABNORMAL LOW (ref 0.44–1.00)
GFR calc Af Amer: >60 mL/min (ref >60)
GFR calc non Af Amer: >60 mL/min (ref >60)
Glucose, Bld: 87 mg/dL (ref 70–99)
Potassium: 3.7 mmol/L (ref 3.5–5.1)
Sodium: 140 mmol/L (ref 135–145)

## 2020-07-02 LAB — VITAMIN B12: Vitamin B-12: 4592 pg/mL — ABNORMAL HIGH (ref 180–914)

## 2020-07-02 LAB — MAGNESIUM: Magnesium: 1.9 mg/dL (ref 1.7–2.4)

## 2020-07-02 LAB — PHOSPHORUS: Phosphorus: 3.7 mg/dL (ref 2.5–4.6)

## 2020-07-02 LAB — FOLATE: Folate: 51.9 ng/mL (ref >5.9)

## 2020-07-02 LAB — FERRITIN: Ferritin: 104 ng/mL (ref 11–307)

## 2020-07-02 MED ORDER — SODIUM CHLORIDE 0.9 % IV SOLN: INTRAVENOUS | Status: DC

## 2020-07-02 NOTE — Progress Notes (Signed)
PROGRESS NOTE  Kristine Roberts  NGE:[id number] DOB: 08/04/1956 DOA: 06/25/2020 PCP: [facility]  Brief Narrative:  64 year old white female known chronic COPD (previously on oxygen up to 3 years ago-currently not), chronic ethanolism-last drink "1 week ago" Osteoporosis with compression fractures in the past Recurrent emesis with hiatal hernia/prior gastrojejunostomy EGD 08/2019 anastomotic ulcer with stenosis status post dilatation 08/24/2019 dilatation of pylorus-at that time was placed on supplemental TPN  Patient also had?  Shock at that admission in addition to transaminitis 2/2 ischemic injury Severe protein energy malnutrition with severe weight loss BMI currently 11 on last discharge was 84 pounds currently is back at 60 She currently lives at home with her boyfriend Kathlene November She was admitted 7/21 2/2 closed left hip fracture with accidental fall and weakness and found to have coincidental left upper lobe pneumonia She received and has completed empiric antibiotics  Assessment & Plan:   Principal Problem:   Closed left hip fracture, initial encounter (HCC) Active Problems:   COPD (chronic obstructive pulmonary disease) (HCC)   Protein-calorie malnutrition, severe   ETOH abuse   Gastric ulcer   Community acquired pneumonia of left upper lobe of lung   RBBB   1. Closed left hip fracture a. As per orthopedics further management b. Weightbearing as tolerated, Lovenox and follow-up in the outpatient with Ortho-much appreciated input 2. Left upper lobe pneumonia 2/2 recurrent vomiting a. Completed antibiotics 7/28 b. Because of recurrent vomiting at the bedside with emesis bag half full, I will ask GI to see the patient for consideration of swallowing eval versus endoscopy c. I will continue Reglan IV for now she will also need Protonix 40 twice daily d. She has previously been seen by both Dr. Karilyn Cota in [place] but also by [facility]  gastroenterology 3. COPD a. Does not seem to require oxygen at this time and she will need to be ambulated with a desat screen if there concerns b. Continue albuterol 2.5 every 6 as needed Dulera 2 puffs twice daily 4. EtOH habituation a. Clinically not withdrawing at all b. Discontinue precautions and Ativan 5. Severe protein energy malnutrition with severe weight loss 6. Probable bimodal anemia secondary to eat ethanolism a. Refer to above discussion b. She may need a PICC line placed if we feel we cannot meet her nutritional needs c. Would allow diet for now but if continued vomiting may need to keep n.p.o. d. Obtain iron studies in the morning 7. Chronic right bundle branch block with PVCs a. Monitor show PVCs only with RBBB b. Discontinue telemetry  DVT prophylaxis: Weightbearing as tolerated, Lovenox Code Status: Full Family Communication: None present Disposition:   Status is: Inpatient  Remains inpatient appropriate because:Persistent severe electrolyte disturbances, Unsafe d/c plan and IV treatments appropriate due to intensity of illness or inability to take PO   Dispo: The patient is from: Home              Anticipated d/c is to: SNF              Anticipated d/c date is: 3 days              Patient currently is not medically stable to d/c.       Consultants:   Orthopedics  Gastroenterology consulted 7/28  Procedures: Hip repair  Antimicrobials: Currently none   Subjective: Awake alert but seems to have had some vomiting at the bedside No chest pain no fever states that the pain in her hip is manageable  on the left side she has a dressing wrapping around the hip She does not have much pain and nursing reports that she ambulated with standby assist to the restroom without oxygen requirements No fever no chills Tells me that vomiting has been intractable and she has had "3 dilatations in the past" without much help  Objective: Vitals:   07/02/20 0619  07/02/20 0814 07/02/20 0814 07/02/20 0823  BP: 104/71  109/72 109/72  Pulse: 64  57 68  Resp: 16  16 16   Temp: 98.5 F (36.9 C)  97.8 F (36.6 C) 97.8 F (36.6 C)  TempSrc: Oral  Oral Oral  SpO2: 100% 99% 96% 97%  Weight:      Height:        Intake/Output Summary (Last 24 hours) at 07/02/2020 1033 Last data filed at 07/02/2020 0745 Gross per 24 hour  Intake 480 ml  Output --  Net 480 ml   Filed Weights   06/27/20 0800  Weight: (!) 31.5 kg    Examination:  General exam: Cachectic Caucasian female?  Elevated JVP no dentition (left dentures at home) Respiratory system: Clear no added sound Cardiovascular system: S1-S2-telemetry shows PVCs Gastrointestinal system: Soft nontender no rebound no guarding. Central nervous system: ROM intact grossly intact no focal deficit Extremities: Cachectic Skin: No lower extremity edema Psychiatry: Euthymic congruent  Data Reviewed: I have personally reviewed following labs and imaging studies BUN/creatinine 12/0.3 Hemoglobin 8.1, white count 5.5  Radiology Studies: No results found.   Scheduled Meds: . acetaminophen  1,000 mg Oral TID  . aspirin EC  325 mg Oral Q breakfast  . docusate sodium  100 mg Oral BID  . enoxaparin (LOVENOX) injection  30 mg Subcutaneous Q24H  . feeding supplement (ENSURE ENLIVE)  237 mL Oral TID BM  . feeding supplement  296 mL Oral Once  . mometasone-formoterol  2 puff Inhalation BID  . nicotine  21 mg Transdermal Daily  . pantoprazole  40 mg Oral BID  . povidone-iodine  2 application Topical Once   Continuous Infusions: . cefTRIAXone (ROCEPHIN)  IV 1 g (07/01/20 1720)  . lactated ringers 10 mL/hr at 06/27/20 1507     LOS: 7 days    Time spent: 50 minutes  06/29/20, MD [facility] To contact the attending provider between 7A-7P or the covering provider during after hours 7P-7A, please log into the web site [email or web address] and access using universal [place] password for that  web site. If you do not have the password, please call the hospital operator.  07/02/2020, 10:33 AM

## 2020-07-02 NOTE — Progress Notes (Signed)
Nutrition Follow-up  DOCUMENTATION CODES:   Underweight, Severe malnutrition in context of chronic illness  INTERVENTION:   -Continue Ensure Enlive po TID, each supplement provides 350 kcal and 20 grams of protein -Continue MVI with minerals daily -Continue Hormel Shake BID with meals, each supplement provides 520 kcals and 22 grams protein  NUTRITION DIAGNOSIS:   Severe Malnutrition related to chronic illness (COPD) as evidenced by moderate fat depletion, severe fat depletion, severe muscle depletion, energy intake < or equal to 75% for > or equal to 1 month.  Ongoing  GOAL:   Patient will meet greater than or equal to 90% of their needs  Progressing   MONITOR:   PO intake, Supplement acceptance, Diet advancement, Labs, Weight trends, Skin, I & O's  REASON FOR ASSESSMENT:   Consult Assessment of nutrition requirement/status  ASSESSMENT:   Kristine Roberts is a 64 y.o. female with medical history significant of COPD not on home O2 but still smoking, ongoing EtOH abuse, last drink was yesterday, PUD, severe malnutrition, osteoporosis.  7/23- s/p PROCEDURE: Treatment of intertrochanteric, pertrochanteric, subtrochanteric fracture with intramedullary implant.  Reviewed I/O's: +600 ml x 24 hours and +1.8 L since admission  Attempted to speak with pt via hospital room phone, however, no answer.   Intake has improved; noted meal completion 25-100%. Pt is consuming Ensure Enlive supplements per MAR.  Per [facility] team notes, plan for SNF placement at discharge once medically stable.   Labs reviewed.  Diet Order:   Diet Order            Diet regular Room service appropriate? Yes; Fluid consistency: Thin  Diet effective now                 EDUCATION NEEDS:   Education needs have been addressed  Skin:  Skin Assessment: Skin Integrity Issues: Skin Integrity Issues:: Incisions Incisions: closed lt hip  Last BM:  07/01/20  Height:   Ht Readings from Last 1 Encounters:   06/27/20 5\' 4"  (1.626 m)    Weight:   Wt Readings from Last 1 Encounters:  06/27/20 (!) 31.5 kg    Ideal Body Weight:  45.5 kg  BMI:  Body mass index is 11.92 kg/m.  Estimated Nutritional Needs:   Kcal:  1650-1850  Protein:  80-95 grams  Fluid:  > 1.6 L    06/29/20, RD, LDN, CDCES Registered Dietitian II Certified Diabetes Care and Education Specialist Please refer to [facility] for RD and/or RD on-call/weekend/after hours pager

## 2020-07-02 NOTE — Progress Notes (Signed)
Physical Therapy Treatment Patient Details Name: Kristine Roberts MRN: [id number] DOB: 1956-03-30 Today's Date: 07/02/2020    History of Present Illness Kristine Roberts is a 64 y.o. female s/p fall at home with l hip fx and underwent IM nail on 7/23.  PMH significant for but not minited to COPD not on home O2 but still smoking, EtOH abuse, PUD, severe malnutrition, osteoporosis and r hip fx.    PT Comments    Pt was seen for mobility on RW and noted her increased tolerance for standing but issues with nausea.  Pt is more steady in standing balance, will continue to see her to increase strength and endurance to enhance more safe and independent gait.     Follow Up Recommendations  SNF;Supervision for mobility/OOB     Equipment Recommendations  None recommended by PT    Recommendations for Other Services OT consult     Precautions / Restrictions Precautions Precautions: Fall Restrictions Weight Bearing Restrictions: Yes LLE Weight Bearing: Weight bearing as tolerated    Mobility  Bed Mobility Overal bed mobility: Needs Assistance Bed Mobility: Supine to Sit;Sit to Supine       Sit to supine: Min guard;Min assist      Transfers Overall transfer level: Needs assistance Equipment used: Rolling walker (2 wheeled) Transfers: Sit to/from Stand Sit to Stand: Min guard            Ambulation/Gait Ambulation/Gait assistance: Min guard Gait Distance (Feet): 60 Feet (30 x 2) Assistive device: Rolling walker (2 wheeled) Gait Pattern/deviations: Step-to pattern;Shuffle;Trunk flexed;Antalgic Gait velocity: reduced Gait velocity interpretation: <1.31 ft/sec, indicative of household ambulator General Gait Details: Cues for L knee extension and L heel strike.  Pt with antalgic gt but able to progress distance and responded well to corrections.   Stairs             Wheelchair Mobility    Modified Rankin (Stroke Patients Only)       Balance Overall balance  assessment: Needs assistance   Sitting balance-Leahy Scale: Fair       Standing balance-Leahy Scale: Fair                              Cognition Arousal/Alertness: Awake/alert Behavior During Therapy: WFL for tasks assessed/performed Overall Cognitive Status: Within Functional Limits for tasks assessed                                        Exercises Total Joint Exercises Ankle Circles/Pumps: AROM;5 reps Quad Sets: AROM;10 reps Gluteal Sets: AROM;10 reps Heel Slides: AAROM;10 reps Hip ABduction/ADduction: AAROM;10 reps Straight Leg Raises: AAROM;10 reps    General Comments General comments (skin integrity, edema, etc.): pt was vomiting prior to PT arriving, was given meds to settle tomach but limited walk to avoid further vomiting      Pertinent Vitals/Pain Pain Assessment: 0-10 Pain Score: 8  Pain Location: LLE Pain Descriptors / Indicators: Operative site guarding;Grimacing Pain Intervention(s): Limited activity within patient's tolerance;Premedicated before session;Repositioned    Home Living                      Prior Function            PT Goals (current goals can now be found in the care plan section) Acute Rehab PT Goals Patient Stated Goal: to feel better  and not be sick Progress towards PT goals: Progressing toward goals    Frequency    Min 4X/week      PT Plan Current plan remains appropriate    Co-evaluation              AM-PAC PT "6 Clicks" Mobility   Outcome Measure  Help needed turning from your back to your side while in a flat bed without using bedrails?: A Little Help needed moving from lying on your back to sitting on the side of a flat bed without using bedrails?: A Little Help needed moving to and from a bed to a chair (including a wheelchair)?: A Little Help needed standing up from a chair using your arms (e.g., wheelchair or bedside chair)?: A Little Help needed to walk in hospital  room?: A Little Help needed climbing 3-5 steps with a railing? : A Little 6 Click Score: 18    End of Session Equipment Utilized During Treatment: Gait belt Activity Tolerance: Patient limited by pain Patient left: in chair;with chair alarm set;with call bell/phone within reach   PT Visit Diagnosis: Other abnormalities of gait and mobility (R26.89)     Time: 0258-5277 PT Time Calculation (min) (ACUTE ONLY): 31 min  Charges:  $Gait Training: 8-22 mins $Therapeutic Exercise: 8-22 mins                 Ivar Drape 07/02/2020, 11:21 PM  Samul Dada, PT MS Acute Rehab Dept. Number: [place] [phone] and [facility] [phone]

## 2020-07-02 NOTE — Plan of Care (Signed)

## 2020-07-02 NOTE — Consult Note (Addendum)
 Referring Provider: Dr. Samtani, TRH Primary Care Physician:  [facility] Primary Gastroenterologist:  Dr. Rehman  Reason for Consultation:  Vomiting   HPI: Kristine Roberts is a 64 y.o. female with PMH as listed below.  Was admitted to [facility] hospital after a fall at home from which she suffered a left intertroch hip fx that required intramedullary nail.  Also diagnosed with PNA and was treated with abx, which have been completed.  While she is here she has experienced recurrent vomiting so GI was consulted.  She has a history of Billroth I gastroduodenostomy (performed years ago for ulcer disease).  She says that about 4-5 years ago she started having trouble with vomiting.  Found to have gastric outlet obstruction due to severe stenosed gastroduodenostomy.  Follows with Dr. Rehman in [place].  This has been dilated by Dr. Rehman on several occasions, the last during a hospitalization at [facility] in 08/2019.  Required TNA for a short time during that hospital stay.  Last EGD from 08/2019 as follows:  - Normal upper third of esophagus and middle third of esophagus. - LA Grade C reflux esophagitis. - Z-line, 35 cm from the incisors. - Small hiatal hernia. - Stenosed Billroth I gastroduodenostomy was found, characterized by ulceration and severe stenosis. Dilated. - Normal examined jejunum. - No specimens collected.  Was supposed to follow-up in several weeks for repeat EGD following that, but she says that they were scheduled so far out and then she never called back.  Says that the last dilation did not help and her symptoms went back to the way they were right away.  Says that it just feels like stuff does not move through so she makes herself vomit and then feels better.  No pain per se.  Takes occasional ASA for her arthritis but no other NSAIDs.   Past Medical History:  Diagnosis Date  . COPD (chronic obstructive pulmonary disease) (HCC)   . Dysphagia   . Esophagitis   .  Gastric outlet obstruction   . Gastric stenosis   . GERD (gastroesophageal reflux disease)   . Hiatal hernia   . PUD (peptic ulcer disease)   . Wounds, multiple     Past Surgical History:  Procedure Laterality Date  . APPENDECTOMY    . BALLOON DILATION N/A 03/05/2016   Procedure: BALLOON DILATION;  Surgeon: Najeeb U Rehman, MD;  Location: [facility];  Service: Endoscopy;  Laterality: N/A;  pyloric channel dialtaion  . BALLOON DILATION N/A 07/14/2018   Procedure: BALLOON DILATION;  Surgeon: Gupta, Rajesh, MD;  Location: [facility];  Service: Endoscopy;  Laterality: N/A;  . BIOPSY  07/14/2018   Procedure: BIOPSY;  Surgeon: Gupta, Rajesh, MD;  [facility]: [facility];  Service: Endoscopy;;  . CHOLECYSTECTOMY    . ESOPHAGEAL DILATION N/A 03/03/2016   Procedure: ESOPHAGEAL DILATION;  Surgeon: Najeeb U Rehman, MD;  [facility];  Service: Endoscopy;  Laterality: N/A;  . ESOPHAGEAL DILATION N/A 05/07/2016   Procedure: ESOPHAGEAL DILATION;  Surgeon: Najeeb U Rehman, MD;  [facility];  Service: Endoscopy;  Laterality: N/A;  . ESOPHAGEAL DILATION  01/26/2018   Procedure: DILATION OF ANASTOMOTIC  STRICTURE;  Surgeon: Rehman, Najeeb U, MD;  [facility]: [facility];  Service: Endoscopy;;  . ESOPHAGOGASTRODUODENOSCOPY N/A 03/03/2016   Procedure: ESOPHAGOGASTRODUODENOSCOPY (EGD);  Surgeon: Najeeb U Rehman, MD;  [facility];  Service: Endoscopy;  Laterality: N/A;  2:00  . ESOPHAGOGASTRODUODENOSCOPY N/A 05/07/2016   Procedure: ESOPHAGOGASTRODUODENOSCOPY (EGD);    Surgeon: Najeeb U Rehman, MD;  [facility];  Service: Endoscopy;  Laterality: N/A;  855 - moved to 6/2 @ 10:15 - Ann notified pt  . ESOPHAGOGASTRODUODENOSCOPY N/A 12/28/2017   Procedure: ESOPHAGOGASTRODUODENOSCOPY (EGD) with stricture dilation;  Surgeon: Rehman, Najeeb U, MD;  [facility];  Service: Endoscopy;  Laterality: N/A;  . ESOPHAGOGASTRODUODENOSCOPY N/A 01/26/2018   Procedure:  ESOPHAGOGASTRODUODENOSCOPY (EGD);  Surgeon: Rehman, Najeeb U, MD;  [facility];  Service: Endoscopy;  Laterality: N/A;  255  . ESOPHAGOGASTRODUODENOSCOPY N/A 07/14/2018   Procedure: ESOPHAGOGASTRODUODENOSCOPY (EGD);  Surgeon: Gupta, Rajesh, MD;  [facility];  Service: Endoscopy;  Laterality: N/A;  . ESOPHAGOGASTRODUODENOSCOPY (EGD) WITH PROPOFOL N/A 03/05/2016   Procedure: ESOPHAGOGASTRODUODENOSCOPY (EGD) WITH PROPOFOL Anastomotic stricture dilation ;  Surgeon: Najeeb U Rehman, MD;  [facility];  Service: Endoscopy;  Laterality: N/A;  to be done in OR under fluoro  . ESOPHAGOGASTRODUODENOSCOPY (EGD) WITH PROPOFOL N/A 08/22/2019   Procedure: ESOPHAGOGASTRODUODENOSCOPY (EGD) WITH PROPOFOL With anastomotic stricture dilation.;  Surgeon: Rehman, Najeeb U, MD;  [facility];  Service: Endoscopy;  Laterality: N/A;  . ESOPHAGOGASTRODUODENOSCOPY (EGD) WITH PROPOFOL N/A 08/24/2019   Procedure: ESOPHAGOGASTRODUODENOSCOPY (EGD) WITH PROPOFOL with stricture dilation;  Surgeon: Rehman, Najeeb U, MD;  [facility];  Service: Endoscopy;  Laterality: N/A;  . INTRAMEDULLARY (IM) NAIL INTERTROCHANTERIC Right 07/09/2018   Procedure: INTRAMEDULLARY (IM) NAIL INTERTROCHANTRIC;  Surgeon: Rogers, Jason Patrick, MD;  [facility];  Service: Orthopedics;  Laterality: Right;  . INTRAMEDULLARY (IM) NAIL INTERTROCHANTERIC Left 06/27/2020   Procedure: INTRAMEDULLARY (IM) NAIL INTERTROCHANTRIC;  Surgeon: Rogers, Jason Patrick, MD;  Location: [facility];  Service: Orthopedics;  Laterality: Left;  . STOMACH SURGERY    . TOTAL HIP ARTHROPLASTY      Prior to Admission medications   Medication Sig Start Date End Date Taking? Authorizing Provider  acetaminophen (TYLENOL) 325 MG tablet Take 2 tablets (650 mg total) by mouth every 6 (six) hours as needed for mild pain, fever or headache. 08/31/19  Yes Emokpae, Courage, MD  albuterol (PROVENTIL HFA;VENTOLIN HFA) 108 (90 Base) MCG/ACT  inhaler Inhale 2 puffs into the lungs every 6 (six) hours as needed for wheezing or shortness of breath.    Yes [provider]  feeding supplement, ENSURE ENLIVE, (ENSURE ENLIVE) LIQD Take 237 mLs by mouth 3 (three) times daily between meals. 12/30/17  Yes Emokpae, Courage, MD  Multiple Vitamins-Minerals (MULTIVITAMIN WITH MINERALS) tablet Take 1 tablet by mouth daily.   Yes [provider]  tetrahydrozoline 0.05 % ophthalmic solution Place 1 drop into both eyes daily.   Yes [provider]  thiamine 100 MG tablet Take 1 tablet (100 mg total) by mouth daily. 09/01/19  Yes Emokpae, Courage, MD  Amino Acids-Protein Hydrolys (FEEDING SUPPLEMENT, PRO-STAT SUGAR FREE 64,) LIQD Take 30 mLs by mouth 2 (two) times daily. 08/31/19   Emokpae, Courage, MD  cyclobenzaprine (FLEXERIL) 10 MG tablet Take 1 tablet (10 mg total) by mouth 3 (three) times daily as needed for muscle spasms (Back pain). 08/31/19   Emokpae, Courage, MD    Current Facility-Administered Medications  Medication Dose Route Frequency Provider Last Rate Last Admin  . acetaminophen (TYLENOL) tablet 1,000 mg  1,000 mg Oral TID Girguis, David, MD   1,000 mg at 07/02/20 0949  . albuterol (PROVENTIL) (2.5 MG/3ML) 0.083% nebulizer solution 2.5 mg  2.5 mg Inhalation Q6H PRN Rogers, Jason Patrick, MD      . aspirin EC tablet 325 mg    325 mg Oral Q breakfast Yolonda Kidaogers, Jason Patrick, MD   325 mg at 07/02/20 0759   cyclobenzaprine (FLEXERIL) tablet 10 mg  10 mg Oral TID PRN Yolonda Kidaogers, Jason Patrick, MD   10 mg at 07/01/20 2205   docusate sodium (COLACE) capsule 100 mg  100 mg Oral BID Yolonda Kidaogers, Jason Patrick, MD   100 mg at 07/02/20 0949   enoxaparin (LOVENOX) injection 30 mg  30 mg Subcutaneous Q24H Lewie ChamberGirguis, David, MD   30 mg at 07/01/20 1718   feeding supplement (ENSURE ENLIVE) (ENSURE ENLIVE) liquid 237 mL  237 mL Oral TID BM Yolonda Kidaogers, Jason Patrick, MD   237 mL at 07/01/20 2204   feeding supplement (ENSURE PRE-SURGERY) liquid 296  mL  296 mL Oral Once Yolonda Kidaogers, Jason Patrick, MD       lactated ringers infusion   Intravenous Continuous Yolonda Kidaogers, Jason Patrick, MD 10 mL/hr at 06/27/20 1507 Restarted at 06/27/20 1523   menthol-cetylpyridinium (CEPACOL) lozenge 3 mg  1 lozenge Oral PRN Yolonda Kidaogers, Jason Patrick, MD       Or   phenol [facility](CHLORASEPTIC) mouth spray 1 spray  1 spray Mouth/Throat PRN Yolonda Kidaogers, Jason Patrick, MD       metoCLOPramide [facility](REGLAN) injection 5-10 mg  5-10 mg Intravenous Q8H PRN Yolonda Kidaogers, Jason Patrick, MD       mometasone-formoterol [facility](DULERA) 100-5 MCG/ACT inhaler 2 puff  2 puff Inhalation BID Yolonda Kidaogers, Jason Patrick, MD   2 puff at 07/02/20 16100814   naphazoline-glycerin (CLEAR EYES REDNESS) ophth solution 1 drop  1 drop Both Eyes QID PRN Yolonda Kidaogers, Jason Patrick, MD       nicotine (NICODERM CQ - dosed in mg/24 hours) patch 21 mg  21 mg Transdermal Daily Yolonda Kidaogers, Jason Patrick, MD   21 mg at 07/02/20 0951   ondansetron (ZOFRAN) tablet 4 mg  4 mg Oral Q6H PRN Yolonda Kidaogers, Jason Patrick, MD   4 mg at 06/29/20 1353   Or   ondansetron (ZOFRAN) injection 4 mg  4 mg Intravenous Q6H PRN Yolonda Kidaogers, Jason Patrick, MD   4 mg at 06/28/20 1824   oxyCODONE (Oxy IR/ROXICODONE) immediate release tablet 5 mg  5 mg Oral Q4H PRN Lewie ChamberGirguis, David, MD   5 mg at 07/02/20 0759   pantoprazole (PROTONIX) EC tablet 40 mg  40 mg Oral BID Yolonda Kidaogers, Jason Patrick, MD   40 mg at 07/02/20 0949   povidone-iodine 10 % swab 2 application  2 application Topical Once Yolonda Kidaogers, Jason Patrick, MD        Allergies as of 06/25/2020 - Review Complete 06/25/2020  Allergen Reaction Noted   Penicillins Itching, Swelling, and Other (See Comments) 02/03/2016    Family History  Problem Relation Age of Onset   Alzheimer's disease Mother    COPD Mother    Throat cancer Father    Breast cancer Sister     Social History   Socioeconomic History   Marital status: Widowed    Spouse name: Not on file   Number of children: Not on file   Years of education: Not  on file   Highest education level: Not on file  Occupational History   Not on file  Tobacco Use   Smoking status: Current Every Day Smoker    Packs/day: 1.50    Years: 20.00    Pack years: 30.00    Types: Cigarettes   Smokeless tobacco: Never Used  Vaping Use   Vaping Use: Never used  Substance and Sexual Activity   Alcohol use: Not Currently  Alcohol/week: 12.0 standard drinks    Types: 12 Cans of beer per week    Comment: weekly  . Drug use: No  . Sexual activity: Yes    Birth control/protection: None  Other Topics Concern  . Not on file  Social History Narrative  . Not on file   Social Determinants of Health   Financial Resource Strain:   . Difficulty of Paying Living Expenses:   Food Insecurity:   . Worried About Running Out of Food in the Last Year:   . Ran Out of Food in the Last Year:   Transportation Needs:   . Lack of Transportation (Medical):   . Lack of Transportation (Non-Medical):   Physical Activity:   . Days of Exercise per Week:   . Minutes of Exercise per Session:   Stress:   . Feeling of Stress :   Social Connections:   . Frequency of Communication with Friends and Family:   . Frequency of Social Gatherings with Friends and Family:   . Attends Religious Services:   . Active Member of Clubs or Organizations:   . Attends Club or Organization Meetings:   . Marital Status:   Intimate Partner Violence:   . Fear of Current or Ex-Partner:   . Emotionally Abused:   . Physically Abused:   . Sexually Abused:    Review of Systems: ROS is O/W negative except as mentioned in HPI.  Physical Exam: Vital signs in last 24 hours: Temp:  [97.8 F (36.6 C)-98.9 F (37.2 C)] 97.8 F (36.6 C) (07/28 0823) Pulse Rate:  [57-70] 68 (07/28 0823) Resp:  [16-18] 16 (07/28 0823) BP: (104-109)/(61-72) 109/72 (07/28 0823) SpO2:  [96 %-100 %] 97 % (07/28 0823) Last BM Date: 06/29/20 General:  Alert, Well-developed, well-nourished, pleasant and cooperative  in NAD; extremely thin/cachectic. Head:  Normocephalic and atraumatic. Eyes:  Sclera clear, no icterus.  Conjunctiva pink. Ears:  Normal auditory acuity. Mouth:  No deformity or lesions.   Lungs:  Clear throughout to auscultation.  No wheezes, crackles, or rhonchi.  Heart:  Regular rate and rhythm; no murmurs, clicks, rubs, or gallops. Abdomen:  Soft, non-distended.  BS present.  Non-tender.   Msk:  Symmetrical without gross deformities. Pulses:  Normal pulses noted. Extremities:  Without clubbing or edema. Neurologic:  Alert and oriented x 4;  grossly normal neurologically. Skin:  Intact without significant lesions or rashes. Psych:  Alert and cooperative. Normal mood and affect.  Intake/Output from previous day: 07/27 0701 - 07/28 0700 In: 600 [P.O.:600] Out: -  Intake/Output this shift: Total I/O In: 720 [P.O.:720] Out: -   Lab Results: Recent Labs    06/30/20 0635 07/01/20 0341 07/02/20 0414  WBC 7.0 6.2 5.5  HGB 7.9* 8.5* 8.1*  HCT 25.7* 27.4* 25.9*  PLT 209 242 265   BMET Recent Labs    06/30/20 0635 07/01/20 0341 07/02/20 0414  NA 137 140 140  K 4.3 3.9 3.7  CL 104 105 104  CO2 26 29 29  GLUCOSE 85 94 87  BUN 11 10 12  CREATININE 0.37* 0.40* 0.38*  CALCIUM 7.7* 8.1* 8.2*   IMPRESSION:  *History of gastric outlet obstruction secondary to severely stenosed Billroth I gastroduodenostomy (performed years ago for ulcer disease), s/p dilation last in 08/2019 and was supposed to follow-up for repeat dilation as outpatient.  Has ongoing vomiting from this.  Says that last dilation did not help and her symptoms returned again right away so this issue has been ongoing. *Severe   protein-calorie malnutrition:  Was on TNA at last hospitalization in 08/2019.  BMI 12 currently. *S/p left intertroch hip fx with intramedullary nail *Left upper lobe PNA:  Improving, seemed mild.  PLAN: -Will discuss with Dr. Cirigliano about repeat EGD with dilation while she is here.  Will  need outpatient follow-up with Dr. Rehman as she may need a series of dilations to try to open that stenosis further and keep it open for some time.  Mylie Mccurley D. Eldene Plocher  07/02/2020, 11:26 AM     

## 2020-07-02 NOTE — TOC Progression Note (Signed)
Transition of Care [facility]) - Progression Note    Patient Details  Name: Kristine Roberts MRN: [id number] Date of Birth: [birth year]  [facility] CM/SW Contact  Janae Bridgeman, RN Phone Number: 07/02/2020, 4:13 PM  Clinical Narrative:    [facility] called after receiving clinicals and stated that they would not have a SNF bed available to the patient in the next 2 days.  I called [facility] and they did not have bed availability.  I called and spoke with Lurena Joiner at [facility] and they will have a bed available to the patient.  Clinicals were faxed to Lurena Joiner to [phone].   Expected Discharge Plan: Skilled Nursing Facility Barriers to Discharge: Continued Medical Work up  Expected Discharge Plan and Services Expected Discharge Plan: Skilled Nursing Facility   Discharge Planning Services: CM Consult Post Acute Care Choice: Skilled Nursing Facility Living arrangements for the past 2 months: Single Family Home                                       Social Determinants of Health (SDOH) Interventions    Readmission Risk Interventions Readmission Risk Prevention Plan 07/01/2020 08/31/2019  Transportation Screening Complete Complete  PCP or Specialist Appt within 5-7 Days - Not Complete  Not Complete comments - SNF MD will follow up  PCP or Specialist Appt within 3-5 Days Complete -  Home Care Screening - Not Complete  Home Care Screening Not Completed Comments - Going to SNF  Medication Review (RN CM) - Complete  HRI or Home Care Consult Complete -  Social Work Consult for Recovery Care Planning/Counseling Complete -  Palliative Care Screening Complete -  Medication Review Ocean[employer]) Complete -  Some recent data might be hidden

## 2020-07-02 NOTE — H&P (View-Only) (Signed)
Referring Provider: Dr. Mahala Menghini, [facility] Primary Care Physician:  Health, [facility] Primary Gastroenterologist:  Dr. Karilyn Cota  Reason for Consultation:  Vomiting   HPI: Kristine Roberts is a 64 y.o. female with PMH as listed below.  Was admitted to [facility] hospital after a fall at home from which she suffered a left intertroch hip fx that required intramedullary nail.  Also diagnosed with PNA and was treated with abx, which have been completed.  While she is here she has experienced recurrent vomiting so GI was consulted.  She has a history of Billroth I gastroduodenostomy (performed years ago for ulcer disease).  She says that about 4-5 years ago she started having trouble with vomiting.  Found to have gastric outlet obstruction due to severe stenosed gastroduodenostomy.  Follows with Dr. Karilyn Cota in [place].  This has been dilated by Dr. Karilyn Cota on several occasions, the last during a hospitalization at [facility] in 08/2019.  Required TNA for a short time during that hospital stay.  Last EGD from 08/2019 as follows:  - Normal upper third of esophagus and middle third of esophagus. - LA Grade C reflux esophagitis. - Z-line, 35 cm from the incisors. - Small hiatal hernia. - Stenosed Billroth I gastroduodenostomy was found, characterized by ulceration and severe stenosis. Dilated. - Normal examined jejunum. - No specimens collected.  Was supposed to follow-up in several weeks for repeat EGD following that, but she says that they were scheduled so far out and then she never called back.  Says that the last dilation did not help and her symptoms went back to the way they were right away.  Says that it just feels like stuff does not move through so she makes herself vomit and then feels better.  No pain per se.  Takes occasional ASA for her arthritis but no other NSAIDs.   Past Medical History:  Diagnosis Date  . COPD (chronic obstructive pulmonary disease) (HCC)   . Dysphagia   . Esophagitis   .  Gastric outlet obstruction   . Gastric stenosis   . GERD (gastroesophageal reflux disease)   . Hiatal hernia   . PUD (peptic ulcer disease)   . Wounds, multiple     Past Surgical History:  Procedure Laterality Date  . APPENDECTOMY    . BALLOON DILATION N/A 03/05/2016   Procedure: BALLOON DILATION;  Surgeon: Malissa Hippo, MD;  Location: [facility] ENDO SUITE;  Service: Endoscopy;  Laterality: N/A;  pyloric channel dialtaion  . BALLOON DILATION N/A 07/14/2018   Procedure: BALLOON DILATION;  Surgeon: Lynann Bologna, MD;  Location: [facility] ENDOSCOPY;  Service: Endoscopy;  Laterality: N/A;  . BIOPSY  07/14/2018   Procedure: BIOPSY;  Surgeon: Lynann Bologna, MD;  Location: [facility] ENDOSCOPY;  Service: Endoscopy;;  . CHOLECYSTECTOMY    . ESOPHAGEAL DILATION N/A 03/03/2016   Procedure: ESOPHAGEAL DILATION;  Surgeon: Malissa Hippo, MD;  Location: [facility] ENDO SUITE;  Service: Endoscopy;  Laterality: N/A;  . ESOPHAGEAL DILATION N/A 05/07/2016   Procedure: ESOPHAGEAL DILATION;  Surgeon: Malissa Hippo, MD;  Location: [facility] ENDO SUITE;  Service: Endoscopy;  Laterality: N/A;  . ESOPHAGEAL DILATION  01/26/2018   Procedure: DILATION OF ANASTOMOTIC  STRICTURE;  Surgeon: Malissa Hippo, MD;  Location: [facility] ENDO [facility]ITE;  Service: Endoscopy;;  . ESOPHAGOGASTRODUODENOSCOPY N/A 03/03/2016   Procedure: ESOPHAGOGASTRODUODENOSCOPY (EGD);  Surgeon: Malissa Hippo, MD;  Location: [facility] ENDO [facility];  Service: Endoscopy;  Laterality: N/A;  2:00  . ESOPHAGOGASTRODUODENOSCOPY N/A 05/07/2016   Procedure: ESOPHAGOGASTRODUODENOSCOPY (EGD);  Surgeon: Malissa Hippo, MD;  Location: [facility];  Service: Endoscopy;  Laterality: N/A;  855 - moved to 6/2 @ 10:15 - Ann notified pt  . ESOPHAGOGASTRODUODENOSCOPY N/A 12/28/2017   Procedure: ESOPHAGOGASTRODUODENOSCOPY (EGD) with stricture dilation;  Surgeon: Malissa Hippo, MD;  Location: [facility] ENDO [facility];  Service: Endoscopy;  Laterality: N/A;  . ESOPHAGOGASTRODUODENOSCOPY N/A 01/26/2018   Procedure:  ESOPHAGOGASTRODUODENOSCOPY (EGD);  Surgeon: Malissa Hippo, MD;  Location: [facility] ENDO [facility];  Service: Endoscopy;  Laterality: N/A;  255  . ESOPHAGOGASTRODUODENOSCOPY N/A 07/14/2018   Procedure: ESOPHAGOGASTRODUODENOSCOPY (EGD);  Surgeon: Lynann Bologna, MD;  Location: [facility] ENDOSCOPY;  Service: Endoscopy;  Laterality: N/A;  . ESOPHAGOGASTRODUODENOSCOPY (EGD) WITH PROPOFOL N/A 03/05/2016   Procedure: ESOPHAGOGASTRODUODENOSCOPY (EGD) WITH PROPOFOL Anastomotic stricture dilation ;  Surgeon: Malissa Hippo, MD;  Location: [facility];  Service: Endoscopy;  Laterality: N/A;  to be done in OR under fluoro  . ESOPHAGOGASTRODUODENOSCOPY (EGD) WITH PROPOFOL N/A 08/22/2019   Procedure: ESOPHAGOGASTRODUODENOSCOPY (EGD) WITH PROPOFOL With anastomotic stricture dilation.;  Surgeon: Malissa Hippo, MD;  Location: [facility];  Service: Endoscopy;  Laterality: N/A;  . ESOPHAGOGASTRODUODENOSCOPY (EGD) WITH PROPOFOL N/A 08/24/2019   Procedure: ESOPHAGOGASTRODUODENOSCOPY (EGD) WITH PROPOFOL with stricture dilation;  Surgeon: Malissa Hippo, MD;  Location: [facility] ENDO [facility]ITE;  Service: Endoscopy;  Laterality: N/A;  . INTRAMEDULLARY (IM) NAIL INTERTROCHANTERIC Right 07/09/2018   Procedure: INTRAMEDULLARY (IM) NAIL INTERTROCHANTRIC;  Surgeon: Yolonda Kida, MD;  Location: [facility] OR;  Service: Orthopedics;  Laterality: Right;  . INTRAMEDULLARY (IM) NAIL INTERTROCHANTERIC Left 06/27/2020   Procedure: INTRAMEDULLARY (IM) NAIL INTERTROCHANTRIC;  Surgeon: Yolonda Kida, MD;  Location: [facility] OR;  Service: Orthopedics;  Laterality: Left;  . STOMACH SURGERY    . TOTAL HIP ARTHROPLASTY      Prior to Admission medications   Medication Sig Start Date End Date Taking? Authorizing Provider  acetaminophen (TYLENOL) 325 MG tablet Take 2 tablets (650 mg total) by mouth every 6 (six) hours as needed for mild pain, fever or headache. 08/31/19  Yes Emokpae, Courage, MD  albuterol (PROVENTIL HFA;VENTOLIN HFA) 108 (90 Base) MCG/ACT  inhaler Inhale 2 puffs into the lungs every 6 (six) hours as needed for wheezing or shortness of breath.    Yes [provider]  feeding supplement, ENSURE ENLIVE, (ENSURE ENLIVE) LIQD Take 237 mLs by mouth 3 (three) times daily between meals. 12/30/17  Yes Emokpae, Courage, MD  Multiple Vitamins-Minerals (MULTIVITAMIN WITH MINERALS) tablet Take 1 tablet by mouth daily.   Yes [provider]  tetrahydrozoline 0.05 % ophthalmic solution Place 1 drop into both eyes daily.   Yes [provider]  thiamine 100 MG tablet Take 1 tablet (100 mg total) by mouth daily. 09/01/19  Yes Emokpae, Courage, MD  Amino Acids-Protein Hydrolys (FEEDING SUPPLEMENT, PRO-STAT SUGAR FREE 64,) LIQD Take 30 mLs by mouth 2 (two) times daily. 08/31/19   Shon Hale, MD  cyclobenzaprine (FLEXERIL) 10 MG tablet Take 1 tablet (10 mg total) by mouth 3 (three) times daily as needed for muscle spasms (Back pain). 08/31/19   Shon Hale, MD    Current Facility-Administered Medications  Medication Dose Route Frequency Provider Last Rate Last Admin  . acetaminophen (TYLENOL) tablet 1,000 mg  1,000 mg Oral TID Lewie Chamber, MD   1,000 mg at 07/02/20 0949  . albuterol (PROVENTIL) (2.5 MG/3ML) 0.083% nebulizer solution 2.5 mg  2.5 mg Inhalation Q6H PRN Yolonda Kida, MD      . aspirin EC tablet 325 mg  325 mg Oral Q breakfast Yolonda Kida, MD   325 mg at 07/02/20 0759  . cyclobenzaprine (FLEXERIL) tablet 10 mg  10 mg Oral TID PRN Yolonda Kida, MD   10 mg at 07/01/20 2205  . docusate sodium (COLACE) capsule 100 mg  100 mg Oral BID Yolonda Kida, MD   100 mg at 07/02/20 0949  . enoxaparin (LOVENOX) injection 30 mg  30 mg Subcutaneous Q24H Lewie Chamber, MD   30 mg at 07/01/20 1718  . feeding supplement (ENSURE ENLIVE) (ENSURE ENLIVE) liquid 237 mL  237 mL Oral TID BM Yolonda Kida, MD   237 mL at 07/01/20 2204  . feeding supplement (ENSURE PRE-SURGERY) liquid 296  mL  296 mL Oral Once Yolonda Kida, MD      . lactated ringers infusion   Intravenous Continuous Yolonda Kida, MD 10 mL/hr at 06/27/20 1507 Restarted at 06/27/20 1523  . menthol-cetylpyridinium (CEPACOL) lozenge 3 mg  1 lozenge Oral PRN Yolonda Kida, MD       Or  . phenol [facility]) mouth spray 1 spray  1 spray Mouth/Throat PRN Yolonda Kida, MD      . metoCLOPramide [facility]) injection 5-10 mg  5-10 mg Intravenous Q8H PRN Yolonda Kida, MD      . mometasone-formoterol [facility]) 100-5 MCG/ACT inhaler 2 puff  2 puff Inhalation BID Yolonda Kida, MD   2 puff at 07/02/20 [phone]  . naphazoline-glycerin (CLEAR EYES REDNESS) ophth solution 1 drop  1 drop Both Eyes QID PRN Yolonda Kida, MD      . nicotine (NICODERM CQ - dosed in mg/24 hours) patch 21 mg  21 mg Transdermal Daily Yolonda Kida, MD   21 mg at 07/02/20 0951  . ondansetron (ZOFRAN) tablet 4 mg  4 mg Oral Q6H PRN Yolonda Kida, MD   4 mg at 06/29/20 1353   Or  . ondansetron [facility]) injection 4 mg  4 mg Intravenous Q6H PRN Yolonda Kida, MD   4 mg at 06/28/20 1824  . oxyCODONE (Oxy IR/ROXICODONE) immediate release tablet 5 mg  5 mg Oral Q4H PRN Lewie Chamber, MD   5 mg at 07/02/20 0759  . pantoprazole (PROTONIX) EC tablet 40 mg  40 mg Oral BID Yolonda Kida, MD   40 mg at 07/02/20 0949  . povidone-iodine 10 % swab 2 application  2 application Topical Once Yolonda Kida, MD        Allergies as of 06/25/2020 - Review Complete 06/25/2020  Allergen Reaction Noted  . Penicillins Itching, Swelling, and Other (See Comments) 02/03/2016    Family History  Problem Relation Age of Onset  . Alzheimer's disease Mother   . COPD Mother   . Throat cancer Father   . Breast cancer Sister     Social History   Socioeconomic History  . Marital status: Widowed    Spouse name: Not on file  . Number of children: Not on file  . Years of education: Not  on file  . Highest education level: Not on file  Occupational History  . Not on file  Tobacco Use  . Smoking status: Current Every Day Smoker    Packs/day: 1.50    Years: 20.00    Pack years: 30.00    Types: Cigarettes  . Smokeless tobacco: Never Used  Vaping Use  . Vaping Use: Never used  Substance and Sexual Activity  . Alcohol use: Not Currently  Alcohol/week: 12.0 standard drinks    Types: 12 Cans of beer per week    Comment: weekly  . Drug use: No  . Sexual activity: Yes    Birth control/protection: None  Other Topics Concern  . Not on file  Social History Narrative  . Not on file   Social Determinants of Health   Financial Resource Strain:   . Difficulty of Paying Living Expenses:   Food Insecurity:   . Worried About Programme researcher, broadcasting/film/video in the Last Year:   . Barista in the Last Year:   Transportation Needs:   . Freight forwarder (Medical):   Marland Kitchen Lack of Transportation (Non-Medical):   Physical Activity:   . Days of Exercise per Week:   . Minutes of Exercise per Session:   Stress:   . Feeling of Stress :   Social Connections:   . Frequency of Communication with Friends and Family:   . Frequency of Social Gatherings with Friends and Family:   . Attends Religious Services:   . Active Member of Clubs or Organizations:   . Attends Banker Meetings:   Marland Kitchen Marital Status:   Intimate Partner Violence:   . Fear of Current or Ex-Partner:   . Emotionally Abused:   Marland Kitchen Physically Abused:   . Sexually Abused:    Review of Systems: ROS is O/W negative except as mentioned in HPI.  Physical Exam: Vital signs in last 24 hours: Temp:  [97.8 F (36.6 C)-98.9 F (37.2 C)] 97.8 F (36.6 C) (07/28 0823) Pulse Rate:  [57-70] 68 (07/28 0823) Resp:  [16-18] 16 (07/28 0823) BP: (104-109)/(61-72) 109/72 (07/28 0823) SpO2:  [96 %-100 %] 97 % (07/28 0823) Last BM Date: 06/29/20 General:  Alert, Well-developed, well-nourished, pleasant and cooperative  in NAD; extremely thin/cachectic. Head:  Normocephalic and atraumatic. Eyes:  Sclera clear, no icterus.  Conjunctiva pink. Ears:  Normal auditory acuity. Mouth:  No deformity or lesions.   Lungs:  Clear throughout to auscultation.  No wheezes, crackles, or rhonchi.  Heart:  Regular rate and rhythm; no murmurs, clicks, rubs, or gallops. Abdomen:  Soft, non-distended.  BS present.  Non-tender.   Msk:  Symmetrical without gross deformities. Pulses:  Normal pulses noted. Extremities:  Without clubbing or edema. Neurologic:  Alert and oriented x 4;  grossly normal neurologically. Skin:  Intact without significant lesions or rashes. Psych:  Alert and cooperative. Normal mood and affect.  Intake/Output from previous day: 07/27 0701 - 07/28 0700 In: 600 [P.O.:600] Out: -  Intake/Output this shift: Total I/O In: 720 [P.O.:720] Out: -   Lab Results: Recent Labs    06/30/20 0635 07/01/20 0341 07/02/20 0414  WBC 7.0 6.2 5.5  HGB 7.9* 8.5* 8.1*  HCT 25.7* 27.4* 25.9*  PLT 209 242 265   BMET Recent Labs    06/30/20 0635 07/01/20 0341 07/02/20 0414  NA 137 140 140  K 4.3 3.9 3.7  CL 104 105 104  CO2 26 29 29   GLUCOSE 85 94 87  BUN 11 10 12   CREATININE 0.37* 0.40* 0.38*  CALCIUM 7.7* 8.1* 8.2*   IMPRESSION:  *History of gastric outlet obstruction secondary to severely stenosed Billroth I gastroduodenostomy (performed years ago for ulcer disease), s/p dilation last in 08/2019 and was supposed to follow-up for repeat dilation as outpatient.  Has ongoing vomiting from this.  Says that last dilation did not help and her symptoms returned again right away so this issue has been ongoing. *Severe  protein-calorie malnutrition:  Was on TNA at last hospitalization in 08/2019.  BMI 12 currently. *S/p left intertroch hip fx with intramedullary nail *Left upper lobe PNA:  Improving, seemed mild.  PLAN: -Will discuss with Dr. Barron Alvine about repeat EGD with dilation while she is here.  Will  need outpatient follow-up with Dr. Karilyn Cota as she may need a series of dilations to try to open that stenosis further and keep it open for some time.  Princella Pellegrini. Zayon Trulson  07/02/2020, 11:26 AM

## 2020-07-03 ENCOUNTER — Inpatient Hospital Stay (HOSPITAL_COMMUNITY): Payer: Medicare Other | Admitting: Certified Registered"

## 2020-07-03 ENCOUNTER — Encounter (HOSPITAL_COMMUNITY)
Admission: EM | Disposition: A | Discharge status: Home Health | Payer: Self-pay | Source: Home / Self Care | Attending: Internal Medicine

## 2020-07-03 ENCOUNTER — Encounter (HOSPITAL_COMMUNITY): Payer: Self-pay | Admitting: Internal Medicine

## 2020-07-03 DIAGNOSIS — K9189 Other postprocedural complications and disorders of digestive system: Secondary | ICD-10-CM

## 2020-07-03 DIAGNOSIS — K2990 Gastroduodenitis, unspecified, without bleeding: Secondary | ICD-10-CM

## 2020-07-03 DIAGNOSIS — T182XXA Foreign body in stomach, initial encounter: Secondary | ICD-10-CM

## 2020-07-03 DIAGNOSIS — K449 Diaphragmatic hernia without obstruction or gangrene: Secondary | ICD-10-CM

## 2020-07-03 DIAGNOSIS — K2970 Gastritis, unspecified, without bleeding: Secondary | ICD-10-CM

## 2020-07-03 DIAGNOSIS — S72002A Fracture of unspecified part of neck of left femur, initial encounter for closed fracture: Secondary | ICD-10-CM | POA: Diagnosis not present

## 2020-07-03 DIAGNOSIS — K259 Gastric ulcer, unspecified as acute or chronic, without hemorrhage or perforation: Secondary | ICD-10-CM

## 2020-07-03 HISTORY — PX: ESOPHAGOGASTRODUODENOSCOPY (EGD) WITH PROPOFOL: SHX5813

## 2020-07-03 HISTORY — PX: KENALOG INJECTION: SHX5298

## 2020-07-03 HISTORY — PX: FOREIGN BODY REMOVAL: SHX962

## 2020-07-03 HISTORY — PX: BIOPSY: SHX5522

## 2020-07-03 LAB — CBC WITH DIFFERENTIAL/PLATELET
Abs Immature Granulocytes: 0.03 10*3/uL (ref 0.00–0.07)
Basophils Absolute: 0 10*3/uL (ref 0.0–0.1)
Basophils Relative: 1 %
Eosinophils Absolute: 0.2 10*3/uL (ref 0.0–0.5)
Eosinophils Relative: 4 %
HCT: 25.7 % — ABNORMAL LOW (ref 36.0–46.0)
Hemoglobin: 8 g/dL — ABNORMAL LOW (ref 12.0–15.0)
Immature Granulocytes: 1 %
Lymphocytes Relative: 24 %
Lymphs Abs: 1.4 10*3/uL (ref 0.7–4.0)
MCH: 29.2 pg (ref 26.0–34.0)
MCHC: 31.1 g/dL (ref 30.0–36.0)
MCV: 93.8 fL (ref 80.0–100.0)
Monocytes Absolute: 0.8 10*3/uL (ref 0.1–1.0)
Monocytes Relative: 13 %
Neutro Abs: 3.4 10*3/uL (ref 1.7–7.7)
Neutrophils Relative %: 57 %
Platelets: 278 10*3/uL (ref 150–400)
RBC: 2.74 MIL/uL — ABNORMAL LOW (ref 3.87–5.11)
RDW: 16 % — ABNORMAL HIGH (ref 11.5–15.5)
WBC: 5.9 10*3/uL (ref 4.0–10.5)
nRBC: 0 % (ref 0.0–0.2)

## 2020-07-03 LAB — BASIC METABOLIC PANEL
Anion gap: 8 (ref 5–15)
BUN: 10 mg/dL (ref 8–23)
CO2: 28 mmol/L (ref 22–32)
Calcium: 8.1 mg/dL — ABNORMAL LOW (ref 8.9–10.3)
Chloride: 102 mmol/L (ref 98–111)
Creatinine, Ser: 0.44 mg/dL (ref 0.44–1.00)
GFR calc Af Amer: >60 mL/min (ref >60)
GFR calc non Af Amer: >60 mL/min (ref >60)
Glucose, Bld: 89 mg/dL (ref 70–99)
Potassium: 4 mmol/L (ref 3.5–5.1)
Sodium: 138 mmol/L (ref 135–145)

## 2020-07-03 SURGERY — ESOPHAGOGASTRODUODENOSCOPY (EGD) WITH PROPOFOL
Anesthesia: Monitor Anesthesia Care

## 2020-07-03 MED ORDER — SODIUM CHLORIDE 0.9 % IV SOLN
510.0000 mg | Freq: Once | INTRAVENOUS | Status: AC
  Administered 2020-07-03: 510 mg via INTRAVENOUS
  Filled 2020-07-03: qty 17

## 2020-07-03 MED ORDER — NICOTINE 21 MG/24HR TD PT24: 21.0000 mg | MEDICATED_PATCH | Freq: Every day | TRANSDERMAL | 0 refills | Status: DC

## 2020-07-03 MED ORDER — TRIAMCINOLONE ACETONIDE 40 MG/ML IJ SUSP
INTRAMUSCULAR | Status: DC | PRN
  Administered 2020-07-03: 40 mg

## 2020-07-03 MED ORDER — ASPIRIN 325 MG PO TBEC: 325.0000 mg | DELAYED_RELEASE_TABLET | Freq: Every day | ORAL | 0 refills | Status: DC

## 2020-07-03 MED ORDER — TRIAMCINOLONE ACETONIDE 40 MG/ML IJ SUSP: 40.0000 mg | Freq: Once | INTRAMUSCULAR | Status: DC

## 2020-07-03 MED ORDER — LANSOPRAZOLE 30 MG PO TBDD: 30.0000 mg | DELAYED_RELEASE_TABLET | Freq: Every day | ORAL | 3 refills | Status: DC

## 2020-07-03 MED ORDER — TRIAMCINOLONE ACETONIDE 40 MG/ML IJ SUSP
INTRAMUSCULAR | Status: AC
  Filled 2020-07-03: qty 1

## 2020-07-03 MED ORDER — MOMETASONE FURO-FORMOTEROL FUM 100-5 MCG/ACT IN AERO: 2.0000 | INHALATION_SPRAY | Freq: Two times a day (BID) | RESPIRATORY_TRACT | 2 refills | Status: DC

## 2020-07-03 MED ORDER — SUCRALFATE 1 G PO TABS
1.0000 g | ORAL_TABLET | Freq: Two times a day (BID) | ORAL | 3 refills | Status: DC
Start: 2020-07-03 — End: 2020-12-16

## 2020-07-03 MED ORDER — PROPOFOL 500 MG/50ML IV EMUL
INTRAVENOUS | Status: DC | PRN
  Administered 2020-07-03: 100 ug/kg/min via INTRAVENOUS

## 2020-07-03 MED ORDER — PANTOPRAZOLE SODIUM 40 MG PO TBEC: 40.0000 mg | DELAYED_RELEASE_TABLET | Freq: Two times a day (BID) | ORAL | 3 refills | Status: DC

## 2020-07-03 MED ORDER — SUCRALFATE 1 G PO TABS
1.0000 g | ORAL_TABLET | Freq: Four times a day (QID) | ORAL | 3 refills | Status: DC
Start: 2020-07-03 — End: 2020-07-03

## 2020-07-03 MED ORDER — SUCRALFATE 1 GM/10ML PO SUSP
1.0000 g | Freq: Three times a day (TID) | ORAL | Status: DC
  Administered 2020-07-03: 1 g via ORAL
  Filled 2020-07-03: qty 10

## 2020-07-03 SURGICAL SUPPLY — 15 items

## 2020-07-03 NOTE — Anesthesia Preprocedure Evaluation (Signed)
Anesthesia Evaluation  Patient identified by MRN, date of birth, ID band Patient awake    Reviewed: Allergy & Precautions, NPO status , Patient's Chart, lab work & pertinent test results  History of Anesthesia Complications Negative for: history of anesthetic complications  Airway Mallampati: II  TM Distance: >3 FB Neck ROM: Full    Dental   Pulmonary pneumonia, resolved, COPD, Current Smoker,    Pulmonary exam normal        Cardiovascular negative cardio ROS Normal cardiovascular exam     Neuro/Psych negative neurological ROS  negative psych ROS   GI/Hepatic hiatal hernia, PUD, GERD  ,(+)     substance abuse  alcohol use, Chronic GOO s/p multiple dilations   Endo/Other  negative endocrine ROS  Renal/GU negative Renal ROS  negative genitourinary   Musculoskeletal negative musculoskeletal ROS (+)   Abdominal   Peds  Hematology  (+) anemia ,   Anesthesia Other Findings  Had IM nail of hip about 1 week ago w/ spinal/MAC  Reproductive/Obstetrics                             Anesthesia Physical Anesthesia Plan  ASA: III  Anesthesia Plan: MAC   Post-op Pain Management:    Induction: Intravenous  PONV Risk Score and Plan: 2 and Propofol infusion, TIVA and Treatment may vary due to age or medical condition  Airway Management Planned: Natural Airway, Nasal Cannula and Simple Face Mask  Additional Equipment: None  Intra-op Plan:   Post-operative Plan:   Informed Consent: I have reviewed the patients History and Physical, chart, labs and discussed the procedure including the risks, benefits and alternatives for the proposed anesthesia with the patient or authorized representative who has indicated his/her understanding and acceptance.       Plan Discussed with:   Anesthesia Plan Comments:         Anesthesia Quick Evaluation

## 2020-07-03 NOTE — Op Note (Signed)
[facility] Patient Name: Kristine Roberts Procedure Date : 07/03/2020 MRN: [id number] Attending MD: Doristine Locks , MD Date of Birth: [birth year] CSN: [id number] Age: 64 Admit Type: Inpatient Procedure:                Upper GI endoscopy Indications:              Therapeutic procedure, Iron deficiency anemia,                            Management of operative complication: Dilation of                            anastomotic stricture, Nausea with vomiting                           64 yo female with history of Billroth 1 complicated                            by anastamotic stricture with multiple EGDs with                            balloon dilation in the past, most recently in                            08/2019 (12 mm TTS balloon). Admitted with hip                            fracture, but reports ongoing upper Gi symptoms                            consistent with persistent stricture. Providers:                Doristine Locks, MD, Alison Murray, RN, Michele Mcalpine Technician Referring MD:              Medicines:                Monitored Anesthesia Care Complications:            No immediate complications. Estimated Blood Loss:     Estimated blood loss was minimal. Procedure:                Pre-Anesthesia Assessment:                           - Prior to the procedure, a History and Physical                            was performed, and patient medications and                            allergies were reviewed. The patient's tolerance of                            previous anesthesia  was also reviewed. The risks                            and benefits of the procedure and the sedation                            options and risks were discussed with the patient.                            All questions were answered, and informed consent                            was obtained. Prior Anticoagulants: The patient has                            taken  no previous anticoagulant or antiplatelet                            agents. ASA Grade Assessment: III - A patient with                            severe systemic disease. After reviewing the risks                            and benefits, the patient was deemed in                            satisfactory condition to undergo the procedure.                           After obtaining informed consent, the endoscope was                            passed under direct vision. Throughout the                            procedure, the patient's blood pressure, pulse, and                            oxygen saturations were monitored continuously. The                            GIF-H190 ([id number]) Olympus gastroscope was                            introduced through the mouth, and advanced to the                            body of the stomach. The upper GI endoscopy was                            accomplished without difficulty. The patient  tolerated the procedure well. Scope In: Scope Out: Findings:      LA Grade C (one or more mucosal breaks continuous between tops of 2 or       more mucosal folds, less than 75% circumference) esophagitis with no       bleeding was found in the lower third of the esophagus.      A 2 cm hiatal hernia was present.      A large amount of food (residue) was found in the entire examined       stomach. Attempted to remove food with Lucina Mellow net, but only minimal       improvement in views.      Evidence of a stenosed Billroth I gastroduodenostomy was found. A       gastric pouch with a large size was found containing food debris. The       gastroduodenal anastomosis was characterized by severe stenosis and       ulceration. This could not be traversed. Area was successfully injected       with 4 mL of triamcinolone (40 mg/mL) for muscle relaxation. Estimated       blood loss was minimal. There was a diverticulum adjacent to the       anastamosis.       Mildly erythematous mucosa without bleeding was found in the stomach       near the anastamosis. Biopsies were taken with a cold forceps for       Helicobacter pylori testing. Estimated blood loss was minimal. Impression:               - LA Grade C esophagitis with no bleeding.                           - 2 cm hiatal hernia.                           - A large amount of food (residue) in the stomach.                            Removal was successful.                           - Stenosed Billroth I gastroduodenostomy was found,                            characterized by ulceration and severe stenosis.                            Could not traverse this stenosis. Injected with                            triamcinolone. .                           - Erythematous mucosa in the stomach. Biopsied. Recommendation:           - Return patient to hospital ward for ongoing care.                           - Full liquid diet now and  advance as tolerated.                            Avoid high fat, high fiber foods. Recommend 6 small                            meals per day, including high calorie drinks such                            as Ensure, Boost, etc.                           - Continue present medications.                           - Await pathology results.                           - Will observe for clinical improvement with                            today's intervention, but recommend repeat upper                            endoscopy in 2-4 weeks with balloon dilation of the                            stenosis.                           - Start Prevacid solutabs BID and Carafate 1 gm BID.                           - Follow-up with Dr. Karilyn Cota at appointment to be                            scheduled.                           - Please do not hesitate to contact the GI service                            with additional questions or concerns. Procedure Code(s):        --- Professional  ---                           [phone], 52, Esophagogastroduodenoscopy, flexible,                            transoral; with removal of foreign body(s)                           43236, 59,52, Esophagogastroduodenoscopy, flexible,                            transoral; with directed submucosal injection(s),  any substance                           43239, 52, Esophagogastroduodenoscopy, flexible,                            transoral; with biopsy, single or multiple Diagnosis Code(s):        --- Professional ---                           K20.90, Esophagitis, unspecified without bleeding                           K44.9, Diaphragmatic hernia without obstruction or                            gangrene                           T18.2XXA, Foreign body in stomach, initial encounter                           Z98.0, Intestinal bypass and anastomosis status                           K31.89, Other diseases of stomach and duodenum                           D50.9, Iron deficiency anemia, unspecified                           K91.89, Other postprocedural complications and                            disorders of digestive system                           R11.2, Nausea with vomiting, unspecified CPT copyright 2019 [employer]. All rights reserved. The codes documented in this report are preliminary and upon coder review may  be revised to meet current compliance requirements. Doristine Locks, MD 07/03/2020 9:19:34 AM Number of Addenda: 0

## 2020-07-03 NOTE — Progress Notes (Signed)
Physical Therapy Treatment Patient Details Name: Kristine Roberts MRN: [id number] DOB: 1956-07-19 Today's Date: 07/03/2020    History of Present Illness Kristine Roberts is a 64 y.o. female s/p fall at home with l hip fx and underwent IM nail on 7/23.  PMH significant for but not minited to COPD not on home O2 but still smoking, EtOH abuse, PUD, severe malnutrition, osteoporosis and r hip fx.    PT Comments    Pt supine in bed on arrival.  Pt eager to mobilize and return home this afternoon.  Pt required assistance to for upper and lower body dressing.  Progressed to stair training with good tolerance.  Issued HEP for home use.  Pt awaiting d/c at this time seated in recliner chair.     Follow Up Recommendations  SNF;Supervision for mobility/OOB     Equipment Recommendations  None recommended by PT    Recommendations for Other Services       Precautions / Restrictions Precautions Precautions: Fall Restrictions Weight Bearing Restrictions: Yes LLE Weight Bearing: Weight bearing as tolerated    Mobility  Bed Mobility Overal bed mobility: Needs Assistance Bed Mobility: Supine to Sit     Supine to sit: Supervision     General bed mobility comments: Supervision for safety.  Transfers Overall transfer level: Needs assistance Equipment used: Rolling walker (2 wheeled) Transfers: Sit to/from Stand Sit to Stand: Supervision         General transfer comment: Cues for hand placement to and from seated surface for safety  Ambulation/Gait Ambulation/Gait assistance: Min guard Gait Distance (Feet): 180 Feet Assistive device: Rolling walker (2 wheeled) Gait Pattern/deviations: Step-to pattern;Shuffle;Trunk flexed;Antalgic     General Gait Details: Pt required cues for R foot clearance to avoid shuffling on R side.   Stairs Stairs: Yes Stairs assistance: Supervision Stair Management: Step to pattern Number of Stairs: 4 General stair comments: Cues for sequencing and hand  placement.   Wheelchair Mobility    Modified Rankin (Stroke Patients Only)       Balance Overall balance assessment: Needs assistance   Sitting balance-Leahy Scale: Fair       Standing balance-Leahy Scale: Fair                              Cognition Arousal/Alertness: Awake/alert Behavior During Therapy: WFL for tasks assessed/performed Overall Cognitive Status: Within Functional Limits for tasks assessed                                        Exercises General Exercises - Lower Extremity Ankle Circles/Pumps: AROM;Both;Supine;20 reps Quad Sets: AROM;Left;10 reps;Supine Long Arc Quad: AROM;Left;10 reps;Supine Hip ABduction/ADduction: AAROM;Left;10 reps;Supine    General Comments        Pertinent Vitals/Pain Pain Assessment: 0-10 Pain Location: LLE Pain Descriptors / Indicators: Operative site guarding;Grimacing Pain Intervention(s): Monitored during session;Repositioned    Home Living                      Prior Function            PT Goals (current goals can now be found in the care plan section) Acute Rehab PT Goals Patient Stated Goal: to feel better and not be sick Potential to Achieve Goals: Good Progress towards PT goals: Progressing toward goals    Frequency    Min 4X/week  PT Plan Current plan remains appropriate    Co-evaluation              AM-PAC PT "6 Clicks" Mobility   Outcome Measure  Help needed turning from your back to your side while in a flat bed without using bedrails?: A Little Help needed moving from lying on your back to sitting on the side of a flat bed without using bedrails?: A Little Help needed moving to and from a bed to a chair (including a wheelchair)?: A Little Help needed standing up from a chair using your arms (e.g., wheelchair or bedside chair)?: A Little Help needed to walk in hospital room?: A Little Help needed climbing 3-5 steps with a railing? : A Little 6  Click Score: 18    End of Session Equipment Utilized During Treatment: Gait belt Activity Tolerance: Patient limited by pain Patient left: in chair;with chair alarm set;with call bell/phone within reach Nurse Communication: Mobility status PT Visit Diagnosis: Other abnormalities of gait and mobility (R26.89)     Time: 8811-0315 PT Time Calculation (min) (ACUTE ONLY): 33 min  Charges:  $Gait Training: 8-22 mins $Therapeutic Exercise: 8-22 mins                     Bonney Leitz , PTA Acute Rehabilitation Services Pager [phone] Office [phone]     Kristine Roberts Artis Delay 07/03/2020, 2:48 PM

## 2020-07-03 NOTE — Discharge Summary (Signed)
Physician Discharge Summary  Kristine Roberts OJJ:[id number] DOB: 1956/05/18 DOA: 06/25/2020  PCP: Rand[facility] Patient [facility]  Admit date: 06/25/2020 Discharge date: 07/03/2020  Time spent: 35 minutes  Recommendations for Outpatient Follow-up:  1. Needs outpatient CBC Chem-12 in 1 week 2. Should continue aspirin 325 mg-confirm with Dr. Doristine Locks of GI that this is okay based on her EGD 3. We will CC Dr. Aundria Rud to close loop of communication as does also need  follow-up Outpatient 4.  recommend GI follow the patient in the outpatient setting for further dilatations 5. New meds Carafate, Protonix prescription given 6. May require IV infusions of Feraheme coordinated when she reaches the health department again given her severe anemia  Discharge Diagnoses:  Principal Problem:   Closed left hip fracture, initial encounter (HCC) Active Problems:   COPD (chronic obstructive pulmonary disease) (HCC)   Protein-calorie malnutrition, severe   ETOH abuse   Gastric ulcer   Community acquired pneumonia of left upper lobe of lung   RBBB   H/O Billroth I operation   Anastomotic stricture of gastrojejunostomy   Non-intractable vomiting   Gastritis and gastroduodenitis   Hiatal hernia   Discharge Condition: Improved  Diet recommendation: Soft diet graduating as tolerated  Filed Weights   06/27/20 0800 07/03/20 0749  Weight: (!) 31.5 kg (!) 29.5 kg    History of present illness:  64 year old white female known chronic COPD (previously on oxygen up to 3 years ago-currently not), chronic ethanolism-last drink "1 week ago" Osteoporosis with compression fractures in the past Recurrent emesis with hiatal hernia/prior gastrojejunostomy EGD 08/2019 anastomotic ulcer with stenosis status post dilatation 08/24/2019 dilatation of pylorus-at that time was placed on supplemental TPN             Patient also had?  Shock at that admission in addition to transaminitis 2/2 ischemic injury Severe  protein energy malnutrition with severe weight loss BMI currently 11 on last discharge was 84 pounds currently is back at 60 She currently lives at home with her boyfriend Kathlene November She was admitted 7/21 2/2 closed left hip fracture with accidental fall and weakness and found to have coincidental left upper lobe pneumonia She received and has completed empiric antibiotics  Patient was offered skilled nursing facility placement and declined and wishes to go home with her boyfriend Hospital Course:  1. Closed left hip fracture a. As per orthopedics further management b. Weightbearing as tolerated, aspirin 325 and follow-up in the outpatient with Ortho-much appreciated input 2. Left upper lobe pneumonia 2/2 recurrent vomiting a. Completed antibiotics 7/28 b. Seen by Dr.Cirigliano Milan gastroenterology as below 3. Erosive grade C esophagitis without bleeding, 2 cm hiatal hernia, Billroth I gastroduodenostomy with ulceration severe stenosis on EGD 7/29 1. Long discussion with Dr. Barron Alvine and did discuss also with Dr. Aundria Rud 2. GI is OKAY WITH aspirin 325 going forward for DVT prophylaxis 3. She understands to take a liquid diet going forward and advance as tolerated 4. Pathology is to be followed in the outpatient setting 5. Dr. Jackelyn Knife who is seen the patient in the past should follow the patient for possible repeat balloon dilatation 6. I have changed her Protonix to Prevacid Solutab's and Carafate tabs as per GI instruction 4. COPD a. Does not seem to require oxygen at this time and she will need to be ambulated with a desat screen if there concerns b. Continue albuterol 2.5 every 6 as needed Dulera 2 puffs twice daily 5. EtOH habituation a. Stable during hospital stay 6.  Severe protein energy malnutrition with severe weight loss 7. Probable bimodal anemia secondary to eat ethanolism a. Refer to above discussion b. Iron studies showed severe microcytosis-we will give Feraheme on 7/29 prior  to discharge and she can follow-up in the outpatient setting for this to be arranged 8. Chronic right bundle branch block with PVCs a. Monitor show PVCs only with RBBB b. Discontinue telemetry  Procedures: Hip repair EGD 7/29 Consultations:  Dr. Aundria Rud of orthopedics  Dr. Barron Alvine of GI  Discharge Exam: Vitals:   07/03/20 0954 07/03/20 1137  BP: 96/66 108/70  Pulse: 53 61  Resp: 16 18  Temp: 98.3 F (36.8 C) 97.6 F (36.4 C)  SpO2: 98% 98%    General: Awake coherent pleasant no distress asking to go home tolerating liquid diet Cardiovascular: S1-S2 no murmur Respiratory: Chest clear no added sound Abdomen soft no rebound no guarding  Discharge Instructions   Discharge Instructions    Diet - low sodium heart healthy   Complete by: As directed    Diet - low sodium heart healthy   Complete by: As directed    Discharge instructions   Complete by: As directed    Make sure that you use the walker to get around You have been given 2 new prescriptions to reduce the amount of acid in your stomach and this should be taken at least for a month to 3 months until the stomach doctors see you in the outpatient setting-I would call Dr. Lisette Abu office and we will review his number to ensure that he is aware of the same My recommendation is that you get labs in about 1 to 2 weeks If you continue to have nausea vomiting you will need to be followed up in the outpatient setting sooner by your primary physician Please do not take aspirin for the time being   Discharge instructions   Complete by: As directed    Please make sure that you follow-up with Dr. Dionicia Abler of gastroenterology in the next several weeks Please take aspirin but make sure that you take the Carafate and Protonix which are meds for your stomach in addition as you will need this medication going forward to protect you from a lower extremity blood clot because of your surgery You will be given pain meds on discharge and you  should continue them until you follow-up with either your orthopedic surgeon or your regular physician you should not need to be on several days We will ask home health to come by and assess you at home   Increase activity slowly   Complete by: As directed    Increase activity slowly   Complete by: As directed    No wound care   Complete by: As directed    No wound care   Complete by: As directed      Allergies as of 07/03/2020      Reactions   Penicillins Itching, Swelling, Other (See Comments)   Tongue swells Has patient had a PCN reaction causing immediate rash, facial/tongue/throat swelling, SOB or lightheadedness with hypotension: Yes, had a rash and tongue swelled up. No SOB and lightheadedness. Has patient had a PCN reaction causing severe rash involving mucus membranes or skin necrosis: No Has patient had a PCN reaction that required hospitalization: No Has patient had a PCN reaction occurring within the last 10 years: No If all of the above answers are "NO", then may procee      Medication List    TAKE these  medications   acetaminophen 325 MG tablet Commonly known as: TYLENOL Take 2 tablets (650 mg total) by mouth every 6 (six) hours as needed for mild pain, fever or headache.   albuterol 108 (90 Base) MCG/ACT inhaler Commonly known as: VENTOLIN HFA Inhale 2 puffs into the lungs every 6 (six) hours as needed for wheezing or shortness of breath.   aspirin 325 MG EC tablet Take 1 tablet (325 mg total) by mouth daily with breakfast. Start taking on: July 04, 2020   cyclobenzaprine 10 MG tablet Commonly known as: FLEXERIL Take 1 tablet (10 mg total) by mouth 3 (three) times daily as needed for muscle spasms (Back pain).   feeding supplement (ENSURE ENLIVE) Liqd Take 237 mLs by mouth 3 (three) times daily between meals.   feeding supplement (PRO-STAT SUGAR FREE 64) Liqd Take 30 mLs by mouth 2 (two) times daily.   mometasone-formoterol 100-5 MCG/ACT Aero Commonly  known as: DULERA Inhale 2 puffs into the lungs 2 (two) times daily.   multivitamin with minerals tablet Take 1 tablet by mouth daily.   nicotine 21 mg/24hr patch Commonly known as: NICODERM CQ - dosed in mg/24 hours Place 1 patch (21 mg total) onto the skin daily. Start taking on: July 04, 2020   pantoprazole 40 MG tablet Commonly known as: PROTONIX Take 1 tablet (40 mg total) by mouth 2 (two) times daily.   sucralfate 1 g tablet Commonly known as: Carafate Take 1 tablet (1 g total) by mouth 4 (four) times daily.   tetrahydrozoline 0.05 % ophthalmic solution Place 1 drop into both eyes daily.   thiamine 100 MG tablet Take 1 tablet (100 mg total) by mouth daily.            Durable Medical Equipment  (From admission, onward)         Start     Ordered   07/03/20 1213  DME Walker  Once       Question Answer Comment  Walker: With 5 Inch Wheels   Patient needs a walker to treat with the following condition Pneumonia      07/03/20 1214         Allergies  Allergen Reactions  . Penicillins Itching, Swelling and Other (See Comments)    Tongue swells Has patient had a PCN reaction causing immediate rash, facial/tongue/throat swelling, SOB or lightheadedness with hypotension: Yes, had a rash and tongue swelled up. No SOB and lightheadedness. Has patient had a PCN reaction causing severe rash involving mucus membranes or skin necrosis: No Has patient had a PCN reaction that required hospitalization: No Has patient had a PCN reaction occurring within the last 10 years: No If all of the above answers are "NO", then may procee    Follow-up Information    Yolonda Kida, MD In 2 weeks.   Specialty: Orthopedic Surgery Why: For wound re-check, For suture removal Contact information: [address] [place] [address] [phone]        Care, [facility] Follow up.   Specialty: Home Health Services Why: [facility] will be providing home  health PT/OT and will be calling you in the next 24-48 hours for your discharge to home. Contact information: [address] [place] [address] [phone]                The results of significant diagnostics from this hospitalization (including imaging, microbiology, ancillary and laboratory) are listed below for reference.    Significant Diagnostic  Studies: DG Chest 1 View  Result Date: 06/25/2020 CLINICAL DATA:  Fall with hip pain EXAM: CHEST  1 VIEW COMPARISON:  12/26/2017 FINDINGS: Limited by patient rotation. Mild vague opacity in the right upper lobe. No consolidation, pleural effusion or pneumothorax. Stable cardiomediastinal silhouette with aortic atherosclerosis. Surgical changes at the epigastric region. Old right eighth rib fracture. IMPRESSION: 1. Vague right upper lobe opacity, indeterminate for early or mild infiltrate. Consider short interval two-view chest radiographic follow-up. 2. Otherwise no significant interval change since 12/26/2017. Electronically Signed   By: Jasmine Pang M.D.   On: 06/25/2020 15:03   DG C-Arm 1-60 Min  Result Date: 06/27/2020 CLINICAL DATA:  Hip surgery EXAM: OPERATIVE left HIP (WITH PELVIS IF PERFORMED) 5 VIEWS TECHNIQUE: Fluoroscopic spot image(s) were submitted for interpretation post-operatively. COMPARISON:  06/25/2020 FINDINGS: Five low resolution intraoperative spot views of the left hip. Total fluoroscopy time was 43 seconds. The images demonstrate a comminuted intertrochanteric fracture of the left femur. There is subsequent intramedullary rodding and distal screw fixation of the femur. IMPRESSION: Intraoperative fluoroscopic assistance provided during surgical fixation of left femoral fracture Electronically Signed   By: Jasmine Pang M.D.   On: 06/27/2020 17:40   DG HIP OPERATIVE UNILAT WITH PELVIS LEFT  Result Date: 06/27/2020 CLINICAL DATA:  Hip surgery EXAM: OPERATIVE left HIP (WITH PELVIS IF PERFORMED) 5 VIEWS  TECHNIQUE: Fluoroscopic spot image(s) were submitted for interpretation post-operatively. COMPARISON:  06/25/2020 FINDINGS: Five low resolution intraoperative spot views of the left hip. Total fluoroscopy time was 43 seconds. The images demonstrate a comminuted intertrochanteric fracture of the left femur. There is subsequent intramedullary rodding and distal screw fixation of the femur. IMPRESSION: Intraoperative fluoroscopic assistance provided during surgical fixation of left femoral fracture Electronically Signed   By: Jasmine Pang M.D.   On: 06/27/2020 17:40   DG Hip Unilat With Pelvis 2-3 Views Left  Result Date: 06/25/2020 CLINICAL DATA:  Fall last night.  Left hip deformity and pain. EXAM: DG HIP (WITH OR WITHOUT PELVIS) 2-3V LEFT COMPARISON:  CT of the abdomen pelvis 08/20/2019 FINDINGS: Comminuted intertrochanteric fracture is present in the left hip. Femoroacetabular joint is intact. Pelvis is unremarkable. Osteopenia is noted. Right hip ORIF is again noted. IMPRESSION: Comminuted intertrochanteric fracture of the left hip. Electronically Signed   By: Marin Roberts M.D.   On: 06/25/2020 15:01    Microbiology: Recent Results (from the past 240 hour(s))  SARS Coronavirus 2 by RT PCR (hospital order, performed in [facility] hospital lab) Nasopharyngeal Nasopharyngeal Swab     Status: None   Collection Time: 06/25/20  2:41 PM   Specimen: Nasopharyngeal Swab  Result Value Ref Range Status   SARS Coronavirus 2 NEGATIVE NEGATIVE Final    Comment: (NOTE) SARS-CoV-2 target nucleic acids are NOT DETECTED.  The SARS-CoV-2 RNA is generally detectable in upper and lower respiratory specimens during the acute phase of infection. The lowest concentration of SARS-CoV-2 viral copies this assay can detect is 250 copies / mL. A negative result does not preclude SARS-CoV-2 infection and should not be used as the sole basis for treatment or other patient management decisions.  A negative result  may occur with improper specimen collection / handling, submission of specimen other than nasopharyngeal swab, presence of viral mutation(s) within the areas targeted by this assay, and inadequate number of viral copies (<250 copies / mL). A negative result must be combined with clinical observations, patient history, and epidemiological information.  Fact Sheet for Patients:   BoilerBrush.com.cy  Fact Sheet for Healthcare Providers: https://pope.com/  This test is not yet approved or  cleared by the [place] FDA and has been authorized for detection and/or diagnosis of SARS-CoV-2 by FDA under an Emergency Use Authorization (EUA).  This EUA will remain in effect (meaning this test can be used) for the duration of the COVID-19 declaration under Section 564(b)(1) of the Act, 21 U.S.C. section 360bbb-3(b)(1), unless the authorization is terminated or revoked sooner.  Performed at [facility], [address] [place], [place] [address]   Culture, blood (routine x 2) Call MD if unable to obtain prior to antibiotics being given     Status: None   Collection Time: 06/25/20  8:22 PM   Specimen: BLOOD  Result Value Ref Range Status   Specimen Description BLOOD SITE NOT SPECIFIED  Final   Special Requests   Final    BOTTLES DRAWN AEROBIC AND ANAEROBIC Blood Culture adequate volume   Culture   Final    NO GROWTH 5 DAYS Performed at [facility], [address] [place], [place] [address]    Report Status 06/30/2020 FINAL  Final  Culture, blood (routine x 2) Call MD if unable to obtain prior to antibiotics being given     Status: None   Collection Time: 06/25/20  8:23 PM   Specimen: BLOOD RIGHT WRIST  Result Value Ref Range Status   Specimen Description BLOOD RIGHT WRIST  Final   Special Requests   Final    BOTTLES DRAWN AEROBIC AND ANAEROBIC Blood Culture adequate volume   Culture   Final    NO GROWTH 5 DAYS Performed  at [facility], [address], [place], [place] [address]    Report Status 06/30/2020 FINAL  Final  Surgical pcr screen     Status: None   Collection Time: 06/27/20  3:50 AM   Specimen: Nasal Mucosa; Nasal Swab  Result Value Ref Range Status   MRSA, PCR NEGATIVE NEGATIVE Final   Staphylococcus aureus NEGATIVE NEGATIVE Final    Comment: (NOTE) The Xpert SA Assay (FDA approved for NASAL specimens in patients 27 years of age and older), is one component of a comprehensive surveillance program. It is not intended to diagnose infection nor to guide or monitor treatment. Performed at [facility], [address], [place], [place] [address]      Labs: Basic Metabolic Panel: Recent Labs  Lab 06/28/20 0345 06/28/20 0345 06/29/20 0225 06/30/20 3383 07/01/20 0341 07/02/20 0414 07/03/20 0342  NA 140   < > 139 137 140 140 138  K 4.4   < > 4.4 4.3 3.9 3.7 4.0  CL 108   < > 105 104 105 104 102  CO2 23   < > 27 26 29 29 28   GLUCOSE 109*   < > 110* 85 94 87 89  BUN 12   < > 12 11 10 12 10   CREATININE 0.32*   < > 0.40* 0.37* 0.40* 0.38* 0.44  CALCIUM 8.3*   < > 8.1* 7.7* 8.1* 8.2* 8.1*  MG 1.5*  --  1.7 1.8 1.9 1.9  --   PHOS 2.9  --  2.8 3.6 3.7 3.7  --    < > = values in this interval not displayed.   Liver Function Tests: No results for input(s): AST, ALT, ALKPHOS, BILITOT, PROT, ALBUMIN in the last 168 hours. No results for input(s): LIPASE, AMYLASE in the last 168 hours. No results for input(s):  AMMONIA in the last 168 hours. CBC: Recent Labs  Lab 06/29/20 0225 06/30/20 0635 07/01/20 0341 07/02/20 0414 07/03/20 0342  WBC 7.7 7.0 6.2 5.5 5.9  NEUTROABS 5.3 4.4 3.6 3.3 3.4  HGB 7.9* 7.9* 8.5* 8.1* 8.0*  HCT 25.3* 25.7* 27.4* 25.9* 25.7*  MCV 93.7 94.8 94.5 94.2 93.8  PLT 212 209 242 265 278   Cardiac Enzymes: No results for input(s): CKTOTAL, CKMB, CKMBINDEX, TROPONINI in the last 168 hours. BNP: BNP (last 3 results) No results for input(s): BNP in the  last 8760 hours.  ProBNP (last 3 results) No results for input(s): PROBNP in the last 8760 hours.  CBG: No results for input(s): GLUCAP in the last 168 hours.     Signed:  Rhetta Mura MD   [facility] 07/03/2020, 12:15 PM

## 2020-07-03 NOTE — Plan of Care (Signed)
  Problem: Education: Goal: Knowledge of General Education information will improve Description: Including pain rating scale, medication(s)/side effects and non-pharmacologic comfort measures 07/03/2020 0736 by Loma Sousa, RN Outcome: Adequate for Discharge 07/03/2020 0735 by Loma Sousa, RN Outcome: Adequate for Discharge   Problem: Health Behavior/Discharge Planning: Goal: Ability to manage health-related needs will improve 07/03/2020 0736 by Loma Sousa, RN Outcome: Adequate for Discharge 07/03/2020 0735 by Loma Sousa, RN Outcome: Adequate for Discharge   Problem: Clinical Measurements: Goal: Ability to maintain clinical measurements within normal limits will improve 07/03/2020 0736 by Loma Sousa, RN Outcome: Adequate for Discharge 07/03/2020 0735 by Loma Sousa, RN Outcome: Adequate for Discharge Goal: Will remain free from infection 07/03/2020 0736 by Loma Sousa, RN Outcome: Adequate for Discharge 07/03/2020 0735 by Loma Sousa, RN Outcome: Adequate for Discharge Goal: Diagnostic test results will improve 07/03/2020 0736 by Loma Sousa, RN Outcome: Adequate for Discharge 07/03/2020 0735 by Loma Sousa, RN Outcome: Adequate for Discharge Goal: Respiratory complications will improve 07/03/2020 0736 by Loma Sousa, RN Outcome: Adequate for Discharge 07/03/2020 0735 by Loma Sousa, RN Outcome: Adequate for Discharge Goal: Cardiovascular complication will be avoided 07/03/2020 0736 by Loma Sousa, RN Outcome: Adequate for Discharge 07/03/2020 0735 by Loma Sousa, RN Outcome: Adequate for Discharge   Problem: Activity: Goal: Risk for activity intolerance will decrease 07/03/2020 0736 by Loma Sousa, RN Outcome: Adequate for Discharge 07/03/2020 0735 by Loma Sousa, RN Outcome: Adequate for Discharge   Problem: Nutrition: Goal: Adequate nutrition will be  maintained 07/03/2020 0736 by Loma Sousa, RN Outcome: Adequate for Discharge 07/03/2020 0735 by Loma Sousa, RN Outcome: Adequate for Discharge   Problem: Coping: Goal: Level of anxiety will decrease 07/03/2020 0736 by Loma Sousa, RN Outcome: Adequate for Discharge 07/03/2020 0735 by Loma Sousa, RN Outcome: Adequate for Discharge   Problem: Elimination: Goal: Will not experience complications related to bowel motility 07/03/2020 0736 by Loma Sousa, RN Outcome: Adequate for Discharge 07/03/2020 0735 by Loma Sousa, RN Outcome: Adequate for Discharge Goal: Will not experience complications related to urinary retention 07/03/2020 0736 by Loma Sousa, RN Outcome: Adequate for Discharge 07/03/2020 0735 by Loma Sousa, RN Outcome: Adequate for Discharge   Problem: Pain Managment: Goal: General experience of comfort will improve 07/03/2020 0736 by Loma Sousa, RN Outcome: Adequate for Discharge 07/03/2020 0735 by Loma Sousa, RN Outcome: Adequate for Discharge   Problem: Safety: Goal: Ability to remain free from injury will improve 07/03/2020 0736 by Loma Sousa, RN Outcome: Adequate for Discharge 07/03/2020 0735 by Loma Sousa, RN Outcome: Adequate for Discharge   Problem: Skin Integrity: Goal: Risk for impaired skin integrity will decrease 07/03/2020 0736 by Loma Sousa, RN Outcome: Adequate for Discharge 07/03/2020 0735 by Loma Sousa, RN Outcome: Adequate for Discharge   Problem: Malnutrition  (NI-5.2) Goal: Food and/or nutrient delivery Description: Individualized approach for food/nutrient provision. Outcome: Adequate for Discharge   Problem: Acute Rehab PT Goals(only PT should resolve) Goal: Pt Will Go Supine/Side To Sit Outcome: Adequate for Discharge Goal: Patient Will Transfer Sit To/From Stand Outcome: Adequate for Discharge Goal: Pt Will Ambulate Outcome: Adequate for  Discharge Goal: Pt Will Go Up/Down Stairs Outcome: Adequate for Discharge

## 2020-07-03 NOTE — Interval H&P Note (Signed)
History and Physical Interval Note:  07/03/2020 8:09 AM  Kristine Roberts  has presented today for surgery, with the diagnosis of Vomiting, stenosis of anastomosis.  The various methods of treatment have been discussed with the patient and family. After consideration of risks, benefits and other options for treatment, the patient has consented to  Procedure(s): ESOPHAGOGASTRODUODENOSCOPY (EGD) WITH PROPOFOL (N/A) with possible balloon dilation and steroid injection, surgical staple removal, biopsies as a surgical intervention.  The patient's history has been reviewed, patient examined, no change in status, stable for surgery.  I have reviewed the patient's chart and labs.  Questions were answered to the patient's satisfaction.     Verlin Dike Kassondra Geil

## 2020-07-03 NOTE — TOC Transition Note (Signed)
Transition of Care [facility]) - CM/SW Discharge Note   Patient Details  Name: Kristine Roberts MRN: [id number] Date of Birth: July 20, 1956  Transition of Care [facility]) CM/SW Contact:  Janae Bridgeman, RN Phone Number: 07/03/2020, 11:19 AM   Clinical Narrative:    Case management spoke with the patient in the room with Dr. Mahala Menghini present.  The patient has now declined SNF and would like to go home with home health services today.  Patient was given choice regarding home health and did not have a preference.  Called [facility] and  They will be providing home health PT/OT as ordered.  No dme is needed for home.  Will continue to follow for discharge.   Final next level of care: Skilled Nursing Facility Barriers to Discharge: Continued Medical Work up   Patient Goals and CMS Choice Patient states their goals for this hospitalization and ongoing recovery are:: To go to a SNF for rehabilitation CMS Medicare.gov Compare Post Acute Care list provided to:: Patient Choice offered to / list presented to : Patient  Discharge Placement                       Discharge Plan and Services   Discharge Planning Services: CM Consult Post Acute Care Choice: Skilled Nursing Facility                               Social Determinants of Health (SDOH) Interventions     Readmission Risk Interventions Readmission Risk Prevention Plan 07/01/2020 08/31/2019  Transportation Screening Complete Complete  PCP or Specialist Appt within 5-7 Days - Not Complete  Not Complete comments - SNF MD will follow up  PCP or Specialist Appt within 3-5 Days Complete -  Home Care Screening - Not Complete  Home Care Screening Not Completed Comments - Going to SNF  Medication Review (RN CM) - Complete  HRI or Home Care Consult Complete -  Social Work Consult for Recovery Care Planning/Counseling Complete -  Palliative Care Screening Complete -  Medication Review [employer]) Complete -  Some recent data  might be hidden

## 2020-07-03 NOTE — Transfer of Care (Signed)
Immediate Anesthesia Transfer of Care Note  Patient: Kristine Roberts  Procedure(s) Performed: ESOPHAGOGASTRODUODENOSCOPY (EGD) WITH PROPOFOL (N/A ) KENALOG INJECTION BIOPSY  Patient Location: Endoscopy Unit  Anesthesia Type:MAC  Level of Consciousness: awake, alert , oriented and patient cooperative  Airway & Oxygen Therapy: Patient Spontanous Breathing and Patient connected to nasal cannula oxygen  Post-op Assessment: Report given to RN, Post -op Vital signs reviewed and stable and Patient moving all extremities  Post vital signs: Reviewed and stable  Last Vitals:  Vitals Value Taken Time  BP    Temp    Pulse    Resp 16 07/03/20 0903  SpO2    Vitals shown include unvalidated device data.  Last Pain:  Vitals:   07/03/20 0749  TempSrc: Oral  PainSc: 0-No pain      Patients Stated Pain Goal: 2 (94/80/16 5537)  Complications: No complications documented.

## 2020-07-03 NOTE — Anesthesia Postprocedure Evaluation (Signed)
Anesthesia Post Note  Patient: Kristine Roberts  Procedure(s) Performed: ESOPHAGOGASTRODUODENOSCOPY (EGD) WITH PROPOFOL (N/A ) KENALOG INJECTION BIOPSY     Patient location during evaluation: Endoscopy Anesthesia Type: MAC Level of consciousness: awake and alert Pain management: pain level controlled Vital Signs Assessment: post-procedure vital signs reviewed and stable Respiratory status: spontaneous breathing, nonlabored ventilation and respiratory function stable Cardiovascular status: blood pressure returned to baseline and stable Postop Assessment: no apparent nausea or vomiting Anesthetic complications: no   No complications documented.  Last Vitals:  Vitals:   07/03/20 0920 07/03/20 0925  BP: (!) 96/50 (!) 112/59  Pulse:  58  Resp: 20 13  Temp:    SpO2: 99% 99%    Last Pain:  Vitals:   07/03/20 0925  TempSrc:   PainSc: 0-No pain                 Lidia Collum

## 2020-07-04 ENCOUNTER — Other Ambulatory Visit: Payer: Self-pay | Admitting: Physician Assistant

## 2020-07-04 ENCOUNTER — Encounter (HOSPITAL_COMMUNITY): Payer: Self-pay | Admitting: Gastroenterology

## 2020-07-04 LAB — SURGICAL PATHOLOGY

## 2020-07-06 HISTORY — PX: HIP SURGERY: SHX245

## 2020-07-09 ENCOUNTER — Encounter: Payer: Self-pay | Admitting: Gastroenterology

## 2020-07-24 ENCOUNTER — Encounter (INDEPENDENT_AMBULATORY_CARE_PROVIDER_SITE_OTHER): Payer: Self-pay | Admitting: Gastroenterology

## 2020-07-24 ENCOUNTER — Ambulatory Visit (INDEPENDENT_AMBULATORY_CARE_PROVIDER_SITE_OTHER): Payer: Medicare Other | Admitting: Gastroenterology

## 2020-07-24 ENCOUNTER — Other Ambulatory Visit: Payer: Self-pay

## 2020-07-24 ENCOUNTER — Encounter (INDEPENDENT_AMBULATORY_CARE_PROVIDER_SITE_OTHER): Payer: Self-pay | Admitting: *Deleted

## 2020-07-24 ENCOUNTER — Other Ambulatory Visit (INDEPENDENT_AMBULATORY_CARE_PROVIDER_SITE_OTHER): Payer: Self-pay | Admitting: *Deleted

## 2020-07-24 ENCOUNTER — Telehealth (INDEPENDENT_AMBULATORY_CARE_PROVIDER_SITE_OTHER): Payer: Self-pay | Admitting: *Deleted

## 2020-07-24 VITALS — BP 121/74 | HR 53 | Temp 98.5°F | Ht 62.0 in | Wt 72.4 lb

## 2020-07-24 DIAGNOSIS — K311 Adult hypertrophic pyloric stenosis: Secondary | ICD-10-CM | POA: Diagnosis not present

## 2020-07-24 DIAGNOSIS — E43 Unspecified severe protein-calorie malnutrition: Secondary | ICD-10-CM

## 2020-07-24 DIAGNOSIS — R627 Adult failure to thrive: Secondary | ICD-10-CM

## 2020-07-24 MED ORDER — PLENVU 140 G PO SOLR
1.0000 | Freq: Once | ORAL | 0 refills | Status: AC
Start: 2020-07-24 — End: 2020-07-24

## 2020-07-24 NOTE — Patient Instructions (Addendum)
Schedule EGD with ED, please only drink liquids two days before the procedure Please hold aspirin 5 days before your procedure is performed

## 2020-07-24 NOTE — Progress Notes (Signed)
Kristine Roberts, M.D. Gastroenterology & Hepatology [facility] [address] [place], [place] [address] Primary Care Physician: Health, [facility] [address] [place] [address]  I will communicate my assessment and recommendations to the referring MD via EMR. Note: Occasional unusual wording and randomly placed punctuation marks may result from the use of speech recognition technology to transcribe this document"  Problems: 1.  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  History of Billroth I surgery 3. Grade C esophagitis  4. Severe malnutrition  History of Present Illness: Kristine Roberts is a 64 y.o. female appendical history of COPD, GERD, peptic ulcer disease complicated by bleeding requiring partial gastrectomy with Billroth I reconstruction complicated by recurrent stricture at the anastomosis, who presents for follow-up after recent hospitalization at [facility] health where she was found to have recurrent stricture.  Patient was admitted to the hospital on 06/25/2020 after presenting a fall at home and suffering a left intertrochanteric hip fracture that required surgical repair.  Her hospital course were complicated by an episode of pneumonia.  During her hospital stay she had recurrent episodes of vomiting with inability to tolerate food intake.  The patient underwent an EGD which she was found to have grade C esophagitis, a 2 cm hiatal hernia, presence of food in the gastric chamber with presence of severe stenosis at Billroth I gastroduodenostomy.  This area could not be transversed.  The area was injected with 4 mL of trypsin alone.  Notably there was presence of a diverticulum adjacent to the anastomosis.  Biopsies were taken from the gastric mucosa to rule out H. pylori.  Biopsies were negative for infection.  Patient was discharged home on Carafate 1 g twice a day and Prevacid 30 mg  daily.  The patient reports that she has had a longstanding history of vomiting.  4 days, she has undergone multiple EGDs in the past by Dr. Karilyn Cota, with most recent performed on 08/24/2019, found to have recurrent stricture at anastomosis.  Dilation was performed up to 12 mm with the balloon.  The patient states that after days she had mild improvement in her symptomatology but still was presenting vomiting episodes.  She reports that for the last year she has had more frequent episodes of vomiting after eating.  She cannot eat large amount of food even though she is feeling hungry.  Gets full easily.  States that she vomits close to 5 times per day depending on the amount of food she eats.  She even vomits when she eats small meals.  Reports progressive weight loss down to 68 pounds but she has gained recently 4 pounds.  Notably, she was on TPN transiently for 2 weeks but due to insurance coverage she was not able to have these as outpatient.  The patient reported she feels frustrated as she is not able to eat significant amount of food.  Denies having any nausea a constant basis. The patient denies having any  fever, chills, hematochezia, melena, hematemesis, abdominal distention, abdominal pain, diarrhea, jaundice, pruritus.  Notably, the patient reports that she is an active smoker, smokes 1 pack a day since she is a teenager.  She has not tried to quit.  Last EGD: As above Last Colonoscopy: Patient believes she had it 5 years ago but no reports are available for this procedure.  FHx: neg for any gastrointestinal/liver disease, father had throat cancer and sister had breast cancer  Past Medical History:  Past Medical History:  Diagnosis Date  . COPD (chronic obstructive pulmonary disease) (HCC)   . Dysphagia   . Esophagitis   . Gastric outlet obstruction   . Gastric stenosis   . GERD (gastroesophageal reflux disease)   . Hiatal hernia   . PUD (peptic ulcer disease)   . Wounds, multiple      Past Surgical History: Past Surgical History:  Procedure Laterality Date  . APPENDECTOMY    . BALLOON DILATION N/A 03/05/2016   Procedure: BALLOON DILATION;  Surgeon: Malissa Hippo, MD;  [facility];  Service: Endoscopy;  Laterality: N/A;  pyloric channel dialtaion  . BALLOON DILATION N/A 07/14/2018   Procedure: BALLOON DILATION;  Surgeon: Lynann Bologna, MD;  Location: [facility] ENDOSCOPY;  Service: Endoscopy;  Laterality: N/A;  . BIOPSY  07/14/2018   Procedure: BIOPSY;  Surgeon: Lynann Bologna, MD;  Location: [facility] ENDOSCOPY;  Service: Endoscopy;;  . BIOPSY  07/03/2020   Procedure: BIOPSY;  Surgeon: Shellia Cleverly, DO;  Location: [facility] ENDOSCOPY;  Service: Gastroenterology;;  . CHOLECYSTECTOMY    . ESOPHAGEAL DILATION N/A 03/03/2016   Procedure: ESOPHAGEAL DILATION;  Surgeon: Malissa Hippo, MD;  [facility]: [facility];  Service: Endoscopy;  Laterality: N/A;  . ESOPHAGEAL DILATION N/A 05/07/2016   Procedure: ESOPHAGEAL DILATION;  Surgeon: Malissa Hippo, MD;  [facility];  Service: Endoscopy;  Laterality: N/A;  . ESOPHAGEAL DILATION  01/26/2018   Procedure: DILATION OF ANASTOMOTIC  STRICTURE;  Surgeon: Malissa Hippo, MD;  [facility];  Service: Endoscopy;;  . ESOPHAGOGASTRODUODENOSCOPY N/A 03/03/2016   Procedure: ESOPHAGOGASTRODUODENOSCOPY (EGD);  Surgeon: Malissa Hippo, MD;  [facility];  Service: Endoscopy;  Laterality: N/A;  2:00  . ESOPHAGOGASTRODUODENOSCOPY N/A 05/07/2016   Procedure: ESOPHAGOGASTRODUODENOSCOPY (EGD);  Surgeon: Malissa Hippo, MD;  [facility];  Service: Endoscopy;  Laterality: N/A;  855 - moved to 6/2 @ 10:15 - Ann notified pt  . ESOPHAGOGASTRODUODENOSCOPY N/A 12/28/2017   Procedure: ESOPHAGOGASTRODUODENOSCOPY (EGD) with stricture dilation;  Surgeon: Malissa Hippo, MD;  [facility];  Service: Endoscopy;  Laterality: N/A;  . ESOPHAGOGASTRODUODENOSCOPY N/A 01/26/2018   Procedure:  ESOPHAGOGASTRODUODENOSCOPY (EGD);  Surgeon: Malissa Hippo, MD;  [facility];  Service: Endoscopy;  Laterality: N/A;  255  . ESOPHAGOGASTRODUODENOSCOPY N/A 07/14/2018   Procedure: ESOPHAGOGASTRODUODENOSCOPY (EGD);  Surgeon: Lynann Bologna, MD;  Location: [facility] ENDOSCOPY;  Service: Endoscopy;  Laterality: N/A;  . ESOPHAGOGASTRODUODENOSCOPY (EGD) WITH PROPOFOL N/A 03/05/2016   Procedure: ESOPHAGOGASTRODUODENOSCOPY (EGD) WITH PROPOFOL Anastomotic stricture dilation ;  Surgeon: Malissa Hippo, MD;  [facility];  Service: Endoscopy;  Laterality: N/A;  to be done in OR under fluoro  . ESOPHAGOGASTRODUODENOSCOPY (EGD) WITH PROPOFOL N/A 08/22/2019   Procedure: ESOPHAGOGASTRODUODENOSCOPY (EGD) WITH PROPOFOL With anastomotic stricture dilation.;  Surgeon: Malissa Hippo, MD;  [facility];  Service: Endoscopy;  Laterality: N/A;  . ESOPHAGOGASTRODUODENOSCOPY (EGD) WITH PROPOFOL N/A 08/24/2019   Procedure: ESOPHAGOGASTRODUODENOSCOPY (EGD) WITH PROPOFOL with stricture dilation;  Surgeon: Malissa Hippo, MD;  [facility];  Service: Endoscopy;  Laterality: N/A;  . ESOPHAGOGASTRODUODENOSCOPY (EGD) WITH PROPOFOL N/A 07/03/2020   Procedure: ESOPHAGOGASTRODUODENOSCOPY (EGD) WITH PROPOFOL;  Surgeon: Shellia Cleverly, DO;  Location: [facility] ENDOSCOPY;  Service: Gastroenterology;  Laterality: N/A;  . FOREIGN BODY REMOVAL  07/03/2020   Procedure: FOREIGN BODY REMOVAL;  Surgeon: Shellia Cleverly, DO;  Location: [facility] ENDOSCOPY;  Service: Gastroenterology;;  . INTRAMEDULLARY (IM) NAIL INTERTROCHANTERIC Right 07/09/2018  Procedure: INTRAMEDULLARY (IM) NAIL INTERTROCHANTRIC;  Surgeon: Yolonda Kida, MD;  Location: [facility] OR;  Service: Orthopedics;  Laterality: Right;  . INTRAMEDULLARY (IM) NAIL INTERTROCHANTERIC Left 06/27/2020   Procedure: INTRAMEDULLARY (IM) NAIL INTERTROCHANTRIC;  Surgeon: Yolonda Kida, MD;  Location: [facility] OR;  Service: Orthopedics;  Laterality: Left;  .  KENALOG INJECTION  07/03/2020   Procedure: KENALOG INJECTION;  Surgeon: Shellia Cleverly, DO;  Location: [facility] ENDOSCOPY;  Service: Gastroenterology;;  . STOMACH SURGERY    . TOTAL HIP ARTHROPLASTY      Family History: Family History  Problem Relation Age of Onset  . Alzheimer's disease Mother   . COPD Mother   . Throat cancer Father   . Breast cancer Sister     Social History: Social History   Tobacco Use  Smoking Status Current Every Day Smoker  . Packs/day: 1.50  . Years: 20.00  . Pack years: 30.00  . Types: Cigarettes  Smokeless Tobacco Never Used   Social History   Substance and Sexual Activity  Alcohol Use Not Currently  . Alcohol/week: 12.0 standard drinks  . Types: 12 Cans of beer per week   Comment: weekly   Social History   Substance and Sexual Activity  Drug Use No    Allergies: Allergies  Allergen Reactions  . Penicillins Itching, Swelling and Other (See Comments)    Tongue swells Has patient had a PCN reaction causing immediate rash, facial/tongue/throat swelling, SOB or lightheadedness with hypotension: Yes, had a rash and tongue swelled up. No SOB and lightheadedness. Has patient had a PCN reaction causing severe rash involving mucus membranes or skin necrosis: No Has patient had a PCN reaction that required hospitalization: No Has patient had a PCN reaction occurring within the last 10 years: No If all of the above answers are "NO", then may procee    Medications: Current Outpatient Medications  Medication Sig Dispense Refill  . acetaminophen (TYLENOL) 325 MG tablet Take 2 tablets (650 mg total) by mouth every 6 (six) hours as needed for mild pain, fever or headache. 12 tablet 2  . albuterol (PROVENTIL HFA;VENTOLIN HFA) 108 (90 Base) MCG/ACT inhaler Inhale 2 puffs into the lungs every 6 (six) hours as needed for wheezing or shortness of breath.     . Amino Acids-Protein Hydrolys (FEEDING SUPPLEMENT, PRO-STAT SUGAR FREE 64,) LIQD Take 30 mLs by  mouth 2 (two) times daily. 887 mL 0  . aspirin EC 325 MG EC tablet Take 1 tablet (325 mg total) by mouth daily with breakfast. 30 tablet 0  . cyclobenzaprine (FLEXERIL) 10 MG tablet Take 1 tablet (10 mg total) by mouth 3 (three) times daily as needed for muscle spasms (Back pain). 30 tablet 0  . feeding supplement, ENSURE ENLIVE, (ENSURE ENLIVE) LIQD Take 237 mLs by mouth 3 (three) times daily between meals. 30 Bottle 12  . lansoprazole (PREVACID SOLUTAB) 30 MG disintegrating tablet Take 1 tablet (30 mg total) by mouth daily at 12 noon. 60 tablet 3  . mometasone-formoterol (DULERA) 100-5 MCG/ACT AERO Inhale 2 puffs into the lungs 2 (two) times daily. 13 g 2  . Multiple Vitamins-Minerals (MULTIVITAMIN WITH MINERALS) tablet Take 1 tablet by mouth daily.    . nicotine (NICODERM CQ - DOSED IN MG/24 HOURS) 21 mg/24hr patch Place 1 patch (21 mg total) onto the skin daily. 28 patch 0  . sucralfate (CARAFATE) 1 g tablet Take 1 tablet (1 g total) by mouth 2 (two) times daily. 60 tablet 3  . tetrahydrozoline 0.05 %  ophthalmic solution Place 1 drop into both eyes daily.    Marland Kitchen thiamine 100 MG tablet Take 1 tablet (100 mg total) by mouth daily. 30 tablet 2   No current facility-administered medications for this visit.    Review of Systems: GENERAL: negative for malaise, night sweats HEENT: No changes in hearing or vision, no nose bleeds or other nasal problems. NECK: Negative for lumps, goiter, pain and significant neck swelling RESPIRATORY: Negative for cough, wheezing CARDIOVASCULAR: Negative for chest pain, leg swelling, palpitations, orthopnea GI: SEE HPI MUSCULOSKELETAL: Negative for joint pain or swelling, back pain, and muscle pain. SKIN: Negative for lesions, rash PSYCH: Negative for sleep disturbance, mood disorder and recent psychosocial stressors. HEMATOLOGY Negative for prolonged bleeding, bruising easily, and swollen nodes. ENDOCRINE: Negative for cold or heat intolerance, polyuria,  polydipsia and goiter. NEURO: negative for tremor, gait imbalance, syncope and seizures. The remainder of the review of systems is noncontributory.   Physical Exam: BP 121/74 (BP Location: Right Arm, Patient Position: Sitting, Cuff Size: Small)   Pulse (!) 53   Temp 98.5 F (36.9 C) (Oral)   Ht 5\' 2"  (1.575 m)   Wt 72 lb 6.4 oz (32.8 kg)   BMI 13.24 kg/m  GENERAL: The patient is AO x3, in no acute distress.  Patient is severely underweight.  She has a metallic walking support. HEENT: Head is normocephalic and atraumatic. EOMI are intact. Mouth is well hydrated and without lesions.  Has bilateral temporal wasting. NECK: Supple. No masses LUNGS: Clear to auscultation. No presence of rhonchi/wheezing/rales. Adequate chest expansion HEART: RRR, normal s1 and s2. ABDOMEN: Soft, nontender, no guarding, no peritoneal signs, and nondistended. BS +. No masses. EXTREMITIES: Without any cyanosis, clubbing, rash, lesions or edema. Has diffuse muscle wasting. NEUROLOGIC: AOx3, no focal motor deficit. SKIN: no jaundice, no rashes  Imaging/Labs: as above  I personally reviewed and interpreted the available labs, imaging and endoscopic files.  Impression and Plan: Kristine Roberts is a 63 y.o. female appendical history of COPD, GERD, peptic ulcer disease complicated by bleeding requiring partial gastrectomy with Billroth I reconstruction complicated by recurrent stricture at the anastomosis, who presents for follow-up after recent hospitalization where she was found to have recurrent gastroduodenal anastomotic stricture.  The patient is presenting progressive symptoms that are consistent with severe stricture at the anastomosis.  Due to severe inflammation and presence of food debris in her stomach dilation was not performed but she was injected steroid.  Will need to proceed with a repeat EGD with balloon dilation of the anastomosis, which may need to be repeated in the short-term to obtain an adequate  dilation of the stricture.  The patient is currently taking open capsule Prevacid 30 mg every day which should be continued, as well as with Carafate to allow healing of her mucosa.  Notably, I insisted to the patient that it is critical for her condition to stop smoking as this will lead to recurrent strictures and ulcerations that would not allow an adequate healing.  The patient understood and agreed.  Finally, the patient will require a screening colonoscopy on the same day she is due for it.  - Schedule EGD with anastomotic dilation - patient advised to only drink liquids two days before the procedure -Patient to hold aspirin 5 days before procedure is performed -Continue Prevacid open capsule 30 mg every day -Continue Carafate 1 g twice daily -Smoking cessation  All questions were answered.      76, MD Gastroenterology and Hepatology Kristine Roberts  [facility]

## 2020-07-24 NOTE — Telephone Encounter (Signed)
Patient needs Plenvu (copay card) ° °

## 2020-07-28 NOTE — Patient Instructions (Signed)
Kristine Roberts  07/28/2020     @   Your procedure is scheduled on  08/01/2020.  Report to [facility] at  1000  A.M.  Call this number if you have problems the morning of surgery:  [phone]   Remember:  Follow the diet and prep instructions given to you by the office.                         Take these medicines the morning of surgery with A SIP OF WATER Prevacid. Use your inhalers before you come. You should have stopped your aspirin 07/26/2020.     Do not wear jewelry, make-up or nail polish.  Do not wear lotions, powders, or perfumes. Please wear deodorant and brush your teeth.  Do not shave 48 hours prior to surgery.  Men may shave face and neck.  Do not bring valuables to the hospital.  [facility] is not responsible for any belongings or valuables.  Contacts, dentures or bridgework may not be worn into surgery.  Leave your suitcase in the car.  After surgery it may be brought to your room.  For patients admitted to the hospital, discharge time will be determined by your treatment team.  Patients discharged the day of surgery will not be allowed to drive home.   Name and phone number of your driver:   family Special instructions:  DO NOT smoke the day of your procedure.  Please read over the following fact sheets that you were given. Anesthesia Post-op Instructions and Care and Recovery After Surgery       Upper Endoscopy, Adult, Care After This sheet gives you information about how to care for yourself after your procedure. Your health care provider may also give you more specific instructions. If you have problems or questions, contact your health care provider. What can I expect after the procedure? After the procedure, it is common to have:  A sore throat.  Mild stomach pain or discomfort.  Bloating.  Nausea. Follow these instructions at home:   Follow instructions from your health care provider about what to eat or  drink after your procedure.  Return to your normal activities as told by your health care provider. Ask your health care provider what activities are safe for you.  Take over-the-counter and prescription medicines only as told by your health care provider.  Do not drive for 24 hours if you were given a sedative during your procedure.  Keep all follow-up visits as told by your health care provider. This is important. Contact a health care provider if you have:  A sore throat that lasts longer than one day.  Trouble swallowing. Get help right away if:  You vomit blood or your vomit looks like coffee grounds.  You have: ? A fever. ? Bloody, black, or tarry stools. ? A severe sore throat or you cannot swallow. ? Difficulty breathing. ? Severe pain in your chest or abdomen. Summary  After the procedure, it is common to have a sore throat, mild stomach discomfort, bloating, and nausea.  Do not drive for 24 hours if you were given a sedative during the procedure.  Follow instructions from your health care provider about what to eat or drink after your procedure.  Return to your normal activities as told by your health care provider. This information is not intended to replace advice given to you by your health care  provider. Make sure you discuss any questions you have with your health care provider. Document Revised: 05/16/2018 Document Reviewed: 04/24/2018 Elsevier Patient Education  2020 [employer]  Esophageal Dilatation Esophageal dilatation, also called esophageal dilation, is a procedure to widen or open (dilate) a blocked or narrowed part of the esophagus. The esophagus is the part of the body that moves food and liquid from the mouth to the stomach. You may need this procedure if:  You have a buildup of scar tissue in your esophagus that makes it difficult, painful, or impossible to swallow. This can be caused by gastroesophageal reflux disease (GERD).  You have cancer  of the esophagus.  There is a problem with how food moves through your esophagus. In some cases, you may need this procedure repeated at a later time to dilate the esophagus gradually. Tell a health care provider about:  Any allergies you have.  All medicines you are taking, including vitamins, herbs, eye drops, creams, and over-the-counter medicines.  Any problems you or family members have had with anesthetic medicines.  Any blood disorders you have.  Any surgeries you have had.  Any medical conditions you have.  Any antibiotic medicines you are required to take before dental procedures.  Whether you are pregnant or may be pregnant. What are the risks? Generally, this is a safe procedure. However, problems may occur, including:  Bleeding due to a tear in the lining of the esophagus.  A hole (perforation) in the esophagus. What happens before the procedure?  Follow instructions from your health care provider about eating or drinking restrictions.  Ask your health care provider about changing or stopping your regular medicines. This is especially important if you are taking diabetes medicines or blood thinners.  Plan to have someone take you home from the hospital or clinic.  Plan to have a responsible adult care for you for at least 24 hours after you leave the hospital or clinic. This is important. What happens during the procedure?  You may be given a medicine to help you relax (sedative).  A numbing medicine may be sprayed into the back of your throat, or you may gargle the medicine.  Your health care provider may perform the dilatation using various surgical instruments, such as: ? Simple dilators. This instrument is carefully placed in the esophagus to stretch it. ? Guided wire bougies. This involves using an endoscope to insert a wire into the esophagus. A dilator is passed over this wire to enlarge the esophagus. Then the wire is removed. ? Balloon dilators. An  endoscope with a small balloon at the end is inserted into the esophagus. The balloon is inflated to stretch the esophagus and open it up. The procedure may vary among health care providers and hospitals. What happens after the procedure?  Your blood pressure, heart rate, breathing rate, and blood oxygen level will be monitored until the medicines you were given have worn off.  Your throat may feel slightly sore and numb. This will improve slowly over time.  You will not be allowed to eat or drink until your throat is no longer numb.  When you are able to drink, urinate, and sit on the edge of the bed without nausea or dizziness, you may be able to return home. Follow these instructions at home:  Take over-the-counter and prescription medicines only as told by your health care provider.  Do not drive for 24 hours if you were given a sedative during your procedure.  You should  have a responsible adult with you for 24 hours after the procedure.  Follow instructions from your health care provider about any eating or drinking restrictions.  Do not use any products that contain nicotine or tobacco, such as cigarettes and e-cigarettes. If you need help quitting, ask your health care provider.  Keep all follow-up visits as told by your health care provider. This is important. Get help right away if you:  Have a fever.  Have chest pain.  Have pain that is not relieved by medication.  Have trouble breathing.  Have trouble swallowing.  Vomit blood. Summary  Esophageal dilatation, also called esophageal dilation, is a procedure to widen or open (dilate) a blocked or narrowed part of the esophagus.  Plan to have someone take you home from the hospital or clinic.  For this procedure, a numbing medicine may be sprayed into the back of your throat, or you may gargle the medicine.  Do not drive for 24 hours if you were given a sedative during your procedure. This information is not  intended to replace advice given to you by your health care provider. Make sure you discuss any questions you have with your health care provider. Document Revised: 09/19/2019 Document Reviewed: 09/27/2017 Elsevier Patient Education  2020 [employer].  Colonoscopy, Adult, Care After This sheet gives you information about how to care for yourself after your procedure. Your health care provider may also give you more specific instructions. If you have problems or questions, contact your health care provider. What can I expect after the procedure? After the procedure, it is common to have:  A small amount of blood in your stool for 24 hours after the procedure.  Some gas.  Mild cramping or bloating of your abdomen. Follow these instructions at home: Eating and drinking   Drink enough fluid to keep your urine pale yellow.  Follow instructions from your health care provider about eating or drinking restrictions.  Resume your normal diet as instructed by your health care provider. Avoid heavy or fried foods that are hard to digest. Activity  Rest as told by your health care provider.  Avoid sitting for a long time without moving. Get up to take short walks every 1-2 hours. This is important to improve blood flow and breathing. Ask for help if you feel weak or unsteady.  Return to your normal activities as told by your health care provider. Ask your health care provider what activities are safe for you. Managing cramping and bloating   Try walking around when you have cramps or feel bloated.  Apply heat to your abdomen as told by your health care provider. Use the heat source that your health care provider recommends, such as a moist heat pack or a heating pad. ? Place a towel between your skin and the heat source. ? Leave the heat on for 20-30 minutes. ? Remove the heat if your skin turns bright red. This is especially important if you are unable to feel pain, heat, or cold. You may  have a greater risk of getting burned. General instructions  For the first 24 hours after the procedure: ? Do not drive or use machinery. ? Do not sign important documents. ? Do not drink alcohol. ? Do your regular daily activities at a slower pace than normal. ? Eat soft foods that are easy to digest.  Take over-the-counter and prescription medicines only as told by your health care provider.  Keep all follow-up visits as told by your  health care provider. This is important. Contact a health care provider if:  You have blood in your stool 2-3 days after the procedure. Get help right away if you have:  More than a small spotting of blood in your stool.  Large blood clots in your stool.  Swelling of your abdomen.  Nausea or vomiting.  A fever.  Increasing pain in your abdomen that is not relieved with medicine. Summary  After the procedure, it is common to have a small amount of blood in your stool. You may also have mild cramping and bloating of your abdomen.  For the first 24 hours after the procedure, do not drive or use machinery, sign important documents, or drink alcohol.  Get help right away if you have a lot of blood in your stool, nausea or vomiting, a fever, or increased pain in your abdomen. This information is not intended to replace advice given to you by your health care provider. Make sure you discuss any questions you have with your health care provider. Document Revised: 06/18/2019 Document Reviewed: 06/18/2019 Elsevier Patient Education  2020 [employer] Monitored Anesthesia Care, Care After These instructions provide you with information about caring for yourself after your procedure. Your health care provider may also give you more specific instructions. Your treatment has been planned according to current medical practices, but problems sometimes occur. Call your health care provider if you have any problems or questions after your procedure. What can I  expect after the procedure? After your procedure, you may:  Feel sleepy for several hours.  Feel clumsy and have poor balance for several hours.  Feel forgetful about what happened after the procedure.  Have poor judgment for several hours.  Feel nauseous or vomit.  Have a sore throat if you had a breathing tube during the procedure. Follow these instructions at home: For at least 24 hours after the procedure:      Have a responsible adult stay with you. It is important to have someone help care for you until you are awake and alert.  Rest as needed.  Do not: ? Participate in activities in which you could fall or become injured. ? Drive. ? Use heavy machinery. ? Drink alcohol. ? Take sleeping pills or medicines that cause drowsiness. ? Make important decisions or sign legal documents. ? Take care of children on your own. Eating and drinking  Follow the diet that is recommended by your health care provider.  If you vomit, drink water, juice, or soup when you can drink without vomiting.  Make sure you have little or no nausea before eating solid foods. General instructions  Take over-the-counter and prescription medicines only as told by your health care provider.  If you have sleep apnea, surgery and certain medicines can increase your risk for breathing problems. Follow instructions from your health care provider about wearing your sleep device: ? Anytime you are sleeping, including during daytime naps. ? While taking prescription pain medicines, sleeping medicines, or medicines that make you drowsy.  If you smoke, do not smoke without supervision.  Keep all follow-up visits as told by your health care provider. This is important. Contact a health care provider if:  You keep feeling nauseous or you keep vomiting.  You feel light-headed.  You develop a rash.  You have a fever. Get help right away if:  You have trouble breathing. Summary  For several  hours after your procedure, you may feel sleepy and have poor judgment.  Have  a responsible adult stay with you for at least 24 hours or until you are awake and alert. This information is not intended to replace advice given to you by your health care provider. Make sure you discuss any questions you have with your health care provider. Document Revised: 02/20/2018 Document Reviewed: 03/14/2016 Elsevier Patient Education  2020 [employer].

## 2020-07-30 ENCOUNTER — Other Ambulatory Visit (HOSPITAL_COMMUNITY)
Admission: RE | Admit: 2020-07-30 | Discharge: 2020-07-30 | Disposition: A | Discharge status: Home / Self Care | Payer: Medicare Other | Source: Ambulatory Visit | Attending: Gastroenterology | Admitting: Gastroenterology

## 2020-07-30 ENCOUNTER — Other Ambulatory Visit: Payer: Self-pay

## 2020-07-30 ENCOUNTER — Encounter (HOSPITAL_COMMUNITY)
Admission: RE | Admit: 2020-07-30 | Discharge: 2020-07-30 | Disposition: A | Discharge status: Home / Self Care | Payer: Medicare Other | Source: Ambulatory Visit | Attending: Gastroenterology | Admitting: Gastroenterology

## 2020-07-30 ENCOUNTER — Encounter (HOSPITAL_COMMUNITY): Payer: Self-pay

## 2020-07-30 DIAGNOSIS — Z20822 Contact with and (suspected) exposure to covid-19: Secondary | ICD-10-CM | POA: Diagnosis not present

## 2020-07-30 DIAGNOSIS — Z01812 Encounter for preprocedural laboratory examination: Secondary | ICD-10-CM | POA: Insufficient documentation

## 2020-07-30 LAB — CBC WITH DIFFERENTIAL/PLATELET
Abs Immature Granulocytes: 0.01 10*3/uL (ref 0.00–0.07)
Basophils Absolute: 0 10*3/uL (ref 0.0–0.1)
Basophils Relative: 1 %
Eosinophils Absolute: 0.1 10*3/uL (ref 0.0–0.5)
Eosinophils Relative: 2 %
HCT: 33.8 % — ABNORMAL LOW (ref 36.0–46.0)
Hemoglobin: 10.4 g/dL — ABNORMAL LOW (ref 12.0–15.0)
Immature Granulocytes: 0 %
Lymphocytes Relative: 38 %
Lymphs Abs: 2 10*3/uL (ref 0.7–4.0)
MCH: 30.3 pg (ref 26.0–34.0)
MCHC: 30.8 g/dL (ref 30.0–36.0)
MCV: 98.5 fL (ref 80.0–100.0)
Monocytes Absolute: 0.4 10*3/uL (ref 0.1–1.0)
Monocytes Relative: 8 %
Neutro Abs: 2.7 10*3/uL (ref 1.7–7.7)
Neutrophils Relative %: 51 %
Platelets: 237 10*3/uL (ref 150–400)
RBC: 3.43 MIL/uL — ABNORMAL LOW (ref 3.87–5.11)
RDW: 17.8 % — ABNORMAL HIGH (ref 11.5–15.5)
WBC: 5.3 10*3/uL (ref 4.0–10.5)
nRBC: 0 % (ref 0.0–0.2)

## 2020-07-30 LAB — BASIC METABOLIC PANEL
Anion gap: 8 (ref 5–15)
BUN: 9 mg/dL (ref 8–23)
CO2: 28 mmol/L (ref 22–32)
Calcium: 8.4 mg/dL — ABNORMAL LOW (ref 8.9–10.3)
Chloride: 106 mmol/L (ref 98–111)
Creatinine, Ser: 0.54 mg/dL (ref 0.44–1.00)
GFR calc Af Amer: >60 mL/min (ref >60)
GFR calc non Af Amer: >60 mL/min (ref >60)
Glucose, Bld: 104 mg/dL — ABNORMAL HIGH (ref 70–99)
Potassium: 3 mmol/L — ABNORMAL LOW (ref 3.5–5.1)
Sodium: 142 mmol/L (ref 135–145)

## 2020-07-31 LAB — SARS CORONAVIRUS 2 (TAT 6-24 HRS): SARS Coronavirus 2: NEGATIVE

## 2020-08-01 ENCOUNTER — Encounter (HOSPITAL_COMMUNITY)
Admission: RE | Disposition: A | Discharge status: Home / Self Care | Payer: Self-pay | Source: Ambulatory Visit | Attending: Gastroenterology

## 2020-08-01 ENCOUNTER — Encounter (HOSPITAL_COMMUNITY): Payer: Self-pay | Admitting: Gastroenterology

## 2020-08-01 ENCOUNTER — Ambulatory Visit (HOSPITAL_COMMUNITY): Payer: Medicare Other | Admitting: Anesthesiology

## 2020-08-01 ENCOUNTER — Ambulatory Visit (HOSPITAL_COMMUNITY)
Admission: RE | Admit: 2020-08-01 | Discharge: 2020-08-01 | Disposition: A | Discharge status: Home / Self Care | Payer: Medicare Other | Source: Ambulatory Visit | Attending: Gastroenterology | Admitting: Gastroenterology

## 2020-08-01 DIAGNOSIS — J449 Chronic obstructive pulmonary disease, unspecified: Secondary | ICD-10-CM | POA: Diagnosis not present

## 2020-08-01 DIAGNOSIS — K311 Adult hypertrophic pyloric stenosis: Secondary | ICD-10-CM

## 2020-08-01 DIAGNOSIS — Z7982 Long term (current) use of aspirin: Secondary | ICD-10-CM | POA: Diagnosis not present

## 2020-08-01 DIAGNOSIS — R634 Abnormal weight loss: Secondary | ICD-10-CM | POA: Insufficient documentation

## 2020-08-01 DIAGNOSIS — F17210 Nicotine dependence, cigarettes, uncomplicated: Secondary | ICD-10-CM | POA: Diagnosis not present

## 2020-08-01 DIAGNOSIS — K5730 Diverticulosis of large intestine without perforation or abscess without bleeding: Secondary | ICD-10-CM | POA: Insufficient documentation

## 2020-08-01 DIAGNOSIS — Z7951 Long term (current) use of inhaled steroids: Secondary | ICD-10-CM | POA: Insufficient documentation

## 2020-08-01 DIAGNOSIS — K257 Chronic gastric ulcer without hemorrhage or perforation: Secondary | ICD-10-CM

## 2020-08-01 DIAGNOSIS — G8929 Other chronic pain: Secondary | ICD-10-CM | POA: Diagnosis not present

## 2020-08-01 DIAGNOSIS — K449 Diaphragmatic hernia without obstruction or gangrene: Secondary | ICD-10-CM | POA: Diagnosis not present

## 2020-08-01 DIAGNOSIS — Z8711 Personal history of peptic ulcer disease: Secondary | ICD-10-CM | POA: Insufficient documentation

## 2020-08-01 DIAGNOSIS — Z20822 Contact with and (suspected) exposure to covid-19: Secondary | ICD-10-CM | POA: Diagnosis not present

## 2020-08-01 DIAGNOSIS — K314 Gastric diverticulum: Secondary | ICD-10-CM | POA: Diagnosis not present

## 2020-08-01 DIAGNOSIS — Z79899 Other long term (current) drug therapy: Secondary | ICD-10-CM | POA: Insufficient documentation

## 2020-08-01 DIAGNOSIS — K2100 Gastro-esophageal reflux disease with esophagitis, without bleeding: Secondary | ICD-10-CM | POA: Insufficient documentation

## 2020-08-01 DIAGNOSIS — Z9049 Acquired absence of other specified parts of digestive tract: Secondary | ICD-10-CM | POA: Diagnosis not present

## 2020-08-01 DIAGNOSIS — Z1211 Encounter for screening for malignant neoplasm of colon: Secondary | ICD-10-CM | POA: Diagnosis not present

## 2020-08-01 HISTORY — PX: ESOPHAGOGASTRODUODENOSCOPY (EGD) WITH PROPOFOL: SHX5813

## 2020-08-01 HISTORY — PX: COLONOSCOPY WITH PROPOFOL: SHX5780

## 2020-08-01 HISTORY — PX: ESOPHAGEAL DILATION: SHX303

## 2020-08-01 SURGERY — COLONOSCOPY WITH PROPOFOL
Anesthesia: General

## 2020-08-01 MED ORDER — TRIAMCINOLONE ACETONIDE 40 MG/ML IJ SUSP
INTRAMUSCULAR | Status: AC
  Filled 2020-08-01: qty 2

## 2020-08-01 MED ORDER — TRIAMCINOLONE ACETONIDE 40 MG/ML IJ SUSP
INTRAMUSCULAR | Status: DC | PRN
  Administered 2020-08-01: 60 mg

## 2020-08-01 MED ORDER — LACTATED RINGERS IV SOLN: INTRAVENOUS | Status: DC

## 2020-08-01 MED ORDER — GLYCOPYRROLATE 0.2 MG/ML IJ SOLN
INTRAMUSCULAR | Status: AC
  Filled 2020-08-01: qty 1

## 2020-08-01 MED ORDER — LIDOCAINE VISCOUS HCL 2 % MT SOLN
OROMUCOSAL | Status: AC
  Filled 2020-08-01: qty 15

## 2020-08-01 MED ORDER — GLYCOPYRROLATE 0.2 MG/ML IJ SOLN
0.2000 mg | Freq: Once | INTRAMUSCULAR | Status: AC
  Administered 2020-08-01: 0.2 mg via INTRAVENOUS

## 2020-08-01 MED ORDER — STERILE WATER FOR IRRIGATION IR SOLN
Status: DC | PRN
  Administered 2020-08-01: 1.5 mL

## 2020-08-01 MED ORDER — PROPOFOL 500 MG/50ML IV EMUL
INTRAVENOUS | Status: DC | PRN
  Administered 2020-08-01 (×2): 75 ug/kg/min via INTRAVENOUS

## 2020-08-01 MED ORDER — LIDOCAINE HCL (CARDIAC) PF 50 MG/5ML IV SOSY
PREFILLED_SYRINGE | INTRAVENOUS | Status: DC | PRN
  Administered 2020-08-01: 80 mg via INTRAVENOUS

## 2020-08-01 MED ORDER — LANSOPRAZOLE 30 MG PO TBDD
30.0000 mg | DELAYED_RELEASE_TABLET | Freq: Two times a day (BID) | ORAL | 2 refills | Status: DC
Start: 2020-08-01 — End: 2020-12-16

## 2020-08-01 MED ORDER — PROPOFOL 10 MG/ML IV BOLUS
INTRAVENOUS | Status: DC | PRN
  Administered 2020-08-01: 40 mg via INTRAVENOUS
  Administered 2020-08-01: 80 mg via INTRAVENOUS
  Administered 2020-08-01 (×2): 40 mg via INTRAVENOUS
  Administered 2020-08-01: 20 mg via INTRAVENOUS
  Administered 2020-08-01: 50 mg via INTRAVENOUS

## 2020-08-01 MED ORDER — LIDOCAINE VISCOUS HCL 2 % MT SOLN
15.0000 mL | Freq: Once | OROMUCOSAL | Status: AC
  Administered 2020-08-01: 15 mL via OROMUCOSAL

## 2020-08-01 NOTE — Discharge Instructions (Signed)
You are being discharged to home.  Advance your diet as tolerated.  Your physician has recommended a repeat upper endoscopy in four weeks to evaluate the response to therapy.  Take Prevacid (lansoprazole) 30 mg by mouth twice a day - open capsule to swallow.  Do not take any ibuprofen (including Advil, Motrin or Nuprin), naproxen, or other non-steroidal anti-inflammatory drugs.  Smoking cessation. Your physician has recommended a repeat colonoscopy in 10 years for screening purposes.   Colonoscopy, Adult, Care After This sheet gives you information about how to care for yourself after your procedure. Your doctor may also give you more specific instructions. If you have problems or questions, call your doctor. What can I expect after the procedure? After the procedure, it is common to have:  A small amount of blood in your poop (stool) for 24 hours.  Some gas.  Mild cramping or bloating in your belly (abdomen). Follow these instructions at home: Eating and drinking   Drink enough fluid to keep your pee (urine) pale yellow.  Follow instructions from your doctor about what you cannot eat or drink.  Return to your normal diet as told by your doctor. Avoid heavy or fried foods that are hard to digest. Activity  Rest as told by your doctor.  Do not sit for a long time without moving. Get up to take short walks every 1-2 hours. This is important. Ask for help if you feel weak or unsteady.  Return to your normal activities as told by your doctor. Ask your doctor what activities are safe for you. To help cramping and bloating:   Try walking around.  Put heat on your belly as told by your doctor. Use the heat source that your doctor recommends, such as a moist heat pack or a heating pad. ? Put a towel between your skin and the heat source. ? Leave the heat on for 20-30 minutes. ? Remove the heat if your skin turns bright red. This is very important if you are unable to feel pain,  heat, or cold. You may have a greater risk of getting burned. General instructions  For the first 24 hours after the procedure: ? Do not drive or use machinery. ? Do not sign important documents. ? Do not drink alcohol. ? Do your daily activities more slowly than normal. ? Eat foods that are soft and easy to digest.  Take over-the-counter or prescription medicines only as told by your doctor.  Keep all follow-up visits as told by your doctor. This is important. Contact a doctor if:  You have blood in your poop 2-3 days after the procedure. Get help right away if:  You have more than a small amount of blood in your poop.  You see large clumps of tissue (blood clots) in your poop.  Your belly is swollen.  You feel like you may vomit (nauseous).  You vomit.  You have a fever.  You have belly pain that gets worse, and medicine does not help your pain. Summary  After the procedure, it is common to have a small amount of blood in your poop. You may also have mild cramping and bloating in your belly.  For the first 24 hours after the procedure, do not drive or use machinery, do not sign important documents, and do not drink alcohol.  Get help right away if you have a lot of blood in your poop, feel like you may vomit, have a fever, or have more belly pain. This  information is not intended to replace advice given to you by your health care provider. Make sure you discuss any questions you have with your health care provider. Document Revised: 06/18/2019 Document Reviewed: 06/18/2019 Elsevier Patient Education  2020 [employer] Inc.  Upper Endoscopy, Adult, Care After This sheet gives you information about how to care for yourself after your procedure. Your health care provider may also give you more specific instructions. If you have problems or questions, contact your health care provider. What can I expect after the procedure? After the procedure, it is common to have:  A sore  throat.  Mild stomach pain or discomfort.  Bloating.  Nausea. Follow these instructions at home:   Follow instructions from your health care provider about what to eat or drink after your procedure.  Return to your normal activities as told by your health care provider. Ask your health care provider what activities are safe for you.  Take over-the-counter and prescription medicines only as told by your health care provider.  Do not drive for 24 hours if you were given a sedative during your procedure.  Keep all follow-up visits as told by your health care provider. This is important. Contact a health care provider if you have:  A sore throat that lasts longer than one day.  Trouble swallowing. Get help right away if:  You vomit blood or your vomit looks like coffee grounds.  You have: ? A fever. ? Bloody, black, or tarry stools. ? A severe sore throat or you cannot swallow. ? Difficulty breathing. ? Severe pain in your chest or abdomen. Summary  After the procedure, it is common to have a sore throat, mild stomach discomfort, bloating, and nausea.  Do not drive for 24 hours if you were given a sedative during the procedure.  Follow instructions from your health care provider about what to eat or drink after your procedure.  Return to your normal activities as told by your health care provider. This information is not intended to replace advice given to you by your health care provider. Make sure you discuss any questions you have with your health care provider. Document Revised: 05/16/2018 Document Reviewed: 04/24/2018 Elsevier Patient Education  2020 [employer] Inc[employer]   Monitored Anesthesia Care, Care After These instructions provide you with information about caring for yourself after your procedure. Your health care provider may also give you more specific instructions. Your treatment has been planned according to current medical practices, but problems sometimes  occur. Call your health care provider if you have any problems or questions after your procedure. What can I expect after the procedure? After your procedure, you may:  Feel sleepy for several hours.  Feel clumsy and have poor balance for several hours.  Feel forgetful about what happened after the procedure.  Have poor judgment for several hours.  Feel nauseous or vomit.  Have a sore throat if you had a breathing tube during the procedure. Follow these instructions at home: For at least 24 hours after the procedure:      Have a responsible adult stay with you. It is important to have someone help care for you until you are awake and alert.  Rest as needed.  Do not: ? Participate in activities in which you could fall or become injured. ? Drive. ? Use heavy machinery. ? Drink alcohol. ? Take sleeping pills or medicines that cause drowsiness. ? Make important decisions or sign legal documents. ? Take care of children on your own. Eating  and drinking  Follow the diet that is recommended by your health care provider.  If you vomit, drink water, juice, or soup when you can drink without vomiting.  Make sure you have little or no nausea before eating solid foods. General instructions  Take over-the-counter and prescription medicines only as told by your health care provider.  If you have sleep apnea, surgery and certain medicines can increase your risk for breathing problems. Follow instructions from your health care provider about wearing your sleep device: ? Anytime you are sleeping, including during daytime naps. ? While taking prescription pain medicines, sleeping medicines, or medicines that make you drowsy.  If you smoke, do not smoke without supervision.  Keep all follow-up visits as told by your health care provider. This is important. Contact a health care provider if:  You keep feeling nauseous or you keep vomiting.  You feel light-headed.  You develop a  rash.  You have a fever. Get help right away if:  You have trouble breathing. Summary  For several hours after your procedure, you may feel sleepy and have poor judgment.  Have a responsible adult stay with you for at least 24 hours or until you are awake and alert. This information is not intended to replace advice given to you by your health care provider. Make sure you discuss any questions you have with your health care provider. Document Revised: 02/20/2018 Document Reviewed: 03/14/2016 Elsevier Patient Education  2020 [employer].

## 2020-08-01 NOTE — H&P (Signed)
Kristine Roberts is an 64 y.o. female.   Chief Complaint: Gastric outlet obstruction HPI: 64 y.o. female appendical history of COPD, GERD, peptic ulcer disease complicated by bleeding requiring partial gastrectomy with Billroth I reconstruction complicated by recurrent stricture at the anastomosis, coming to the hospital to undergo endoscopic evaluation and management of recurrent gastroduodenal stricture.  The patient has had recurrent episodes of gastric outlet obstruction that have required endoscopic balloon dilation in the past.  She was admitted to the hospital at [facility] on 06/25/2020 after suffering a left intertrochanteric hip fracture.  Hospital course was relevant for persistent nausea and vomiting for which he underwent an EGD that showed grade C esophagitis, 2 cm hiatal hernia, presence of severe stenosis at Billroth I gastroduodenostomy.  The area was injected with triamcinolone 4 mL but no dilation was performed.  She was discharged home on Carafate 1 g twice daily and Prevacid 30 mg every day.  She reported having significant weight loss recurrent nausea with vomiting episodes after she has meals.  Notably the patient is an active smoker.  She also uses BC powders for chronic pain.  The patient denies having any melena, hematochezia.  Believes that her last colonoscopy was performed 5 years ago and had some polyps.  No report is available.  Past Medical History:  Diagnosis Date  . COPD (chronic obstructive pulmonary disease) (HCC)   . Dysphagia   . Esophagitis   . Gastric outlet obstruction   . Gastric stenosis   . GERD (gastroesophageal reflux disease)   . Hiatal hernia   . PUD (peptic ulcer disease)   . Wounds, multiple     Past Surgical History:  Procedure Laterality Date  . APPENDECTOMY    . BALLOON DILATION N/A 03/05/2016   Procedure: BALLOON DILATION;  Surgeon: Malissa Hippo, MD;  [facility];  Service: Endoscopy;  Laterality: N/A;  pyloric channel  dialtaion  . BALLOON DILATION N/A 07/14/2018   Procedure: BALLOON DILATION;  Surgeon: Lynann Bologna, MD;  Location: [facility] ENDOSCOPY;  Service: Endoscopy;  Laterality: N/A;  . BIOPSY  07/14/2018   Procedure: BIOPSY;  Surgeon: Lynann Bologna, MD;  Location: [facility] ENDOSCOPY;  Service: Endoscopy;;  . BIOPSY  07/03/2020   Procedure: BIOPSY;  Surgeon: Shellia Cleverly, DO;  Location: [facility] ENDOSCOPY;  Service: Gastroenterology;;  . CHOLECYSTECTOMY    . ESOPHAGEAL DILATION N/A 03/03/2016   Procedure: ESOPHAGEAL DILATION;  Surgeon: Malissa Hippo, MD;  [facility];  Service: Endoscopy;  Laterality: N/A;  . ESOPHAGEAL DILATION N/A 05/07/2016   Procedure: ESOPHAGEAL DILATION;  Surgeon: Malissa Hippo, MD;  [facility];  Service: Endoscopy;  Laterality: N/A;  . ESOPHAGEAL DILATION  01/26/2018   Procedure: DILATION OF ANASTOMOTIC  STRICTURE;  Surgeon: Malissa Hippo, MD;  [facility];  Service: Endoscopy;;  . ESOPHAGOGASTRODUODENOSCOPY N/A 03/03/2016   Procedure: ESOPHAGOGASTRODUODENOSCOPY (EGD);  Surgeon: Malissa Hippo, MD;  [facility];  Service: Endoscopy;  Laterality: N/A;  2:00  . ESOPHAGOGASTRODUODENOSCOPY N/A 05/07/2016   Procedure: ESOPHAGOGASTRODUODENOSCOPY (EGD);  Surgeon: Malissa Hippo, MD;  [facility];  Service: Endoscopy;  Laterality: N/A;  855 - moved to 6/2 @ 10:15 - Ann notified pt  . ESOPHAGOGASTRODUODENOSCOPY N/A 12/28/2017   Procedure: ESOPHAGOGASTRODUODENOSCOPY (EGD) with stricture dilation;  Surgeon: Malissa Hippo, MD;  [facility];  Service: Endoscopy;  Laterality: N/A;  . ESOPHAGOGASTRODUODENOSCOPY N/A 01/26/2018   Procedure: ESOPHAGOGASTRODUODENOSCOPY (EGD);  Surgeon: Malissa Hippo, MD;  [facility];  Service: Endoscopy;  Laterality: N/A;  255  . ESOPHAGOGASTRODUODENOSCOPY N/A 07/14/2018   Procedure: ESOPHAGOGASTRODUODENOSCOPY (EGD);  Surgeon: Lynann Bologna, MD;  Location: [facility] ENDOSCOPY;  Service: Endoscopy;   Laterality: N/A;  . ESOPHAGOGASTRODUODENOSCOPY (EGD) WITH PROPOFOL N/A 03/05/2016   Procedure: ESOPHAGOGASTRODUODENOSCOPY (EGD) WITH PROPOFOL Anastomotic stricture dilation ;  Surgeon: Malissa Hippo, MD;  [facility];  Service: Endoscopy;  Laterality: N/A;  to be done in OR under fluoro  . ESOPHAGOGASTRODUODENOSCOPY (EGD) WITH PROPOFOL N/A 08/22/2019   Procedure: ESOPHAGOGASTRODUODENOSCOPY (EGD) WITH PROPOFOL With anastomotic stricture dilation.;  Surgeon: Malissa Hippo, MD;  [facility];  Service: Endoscopy;  Laterality: N/A;  . ESOPHAGOGASTRODUODENOSCOPY (EGD) WITH PROPOFOL N/A 08/24/2019   Procedure: ESOPHAGOGASTRODUODENOSCOPY (EGD) WITH PROPOFOL with stricture dilation;  Surgeon: Malissa Hippo, MD;  [facility];  Service: Endoscopy;  Laterality: N/A;  . ESOPHAGOGASTRODUODENOSCOPY (EGD) WITH PROPOFOL N/A 07/03/2020   Procedure: ESOPHAGOGASTRODUODENOSCOPY (EGD) WITH PROPOFOL;  Surgeon: Shellia Cleverly, DO;  Location: [facility] ENDOSCOPY;  Service: Gastroenterology;  Laterality: N/A;  . FOREIGN BODY REMOVAL  07/03/2020   Procedure: FOREIGN BODY REMOVAL;  Surgeon: Shellia Cleverly, DO;  Location: [facility] ENDOSCOPY;  Service: Gastroenterology;;  . INTRAMEDULLARY (IM) NAIL INTERTROCHANTERIC Right 07/09/2018   Procedure: INTRAMEDULLARY (IM) NAIL INTERTROCHANTRIC;  Surgeon: Yolonda Kida, MD;  Location: [facility] OR;  Service: Orthopedics;  Laterality: Right;  . INTRAMEDULLARY (IM) NAIL INTERTROCHANTERIC Left 06/27/2020   Procedure: INTRAMEDULLARY (IM) NAIL INTERTROCHANTRIC;  Surgeon: Yolonda Kida, MD;  Location: [facility] OR;  Service: Orthopedics;  Laterality: Left;  . KENALOG INJECTION  07/03/2020   Procedure: KENALOG INJECTION;  Surgeon: Shellia Cleverly, DO;  Location: [facility] ENDOSCOPY;  Service: Gastroenterology;;  . STOMACH SURGERY    . TOTAL HIP ARTHROPLASTY Bilateral     Family History  Problem Relation Age of Onset  . Alzheimer's disease Mother   . COPD  Mother   . Throat cancer Father   . Breast cancer Sister    Social History:  reports that she has been smoking cigarettes. She has a 30.00 pack-year smoking history. She has never used smokeless tobacco. She reports previous alcohol use of about 12.0 standard drinks of alcohol per week. She reports that she does not use drugs.  Allergies:  Allergies  Allergen Reactions  . Penicillins Itching, Swelling and Other (See Comments)    Tongue swells Has patient had a PCN reaction causing immediate rash, facial/tongue/throat swelling, SOB or lightheadedness with hypotension: Yes, had a rash and tongue swelled up. No SOB and lightheadedness. Has patient had a PCN reaction causing severe rash involving mucus membranes or skin necrosis: No Has patient had a PCN reaction that required hospitalization: No Has patient had a PCN reaction occurring within the last 10 years: No If all of the above answers are "NO", then may procee    Medications Prior to Admission  Medication Sig Dispense Refill  . albuterol (PROVENTIL HFA;VENTOLIN HFA) 108 (90 Base) MCG/ACT inhaler Inhale 2 puffs into the lungs every 6 (six) hours as needed for wheezing or shortness of breath.     Marland Kitchen aspirin EC 325 MG EC tablet Take 1 tablet (325 mg total) by mouth daily with breakfast. 30 tablet 0  . Calcium Carb-Cholecalciferol (CALCIUM + D3 PO) Take 1 tablet by mouth daily.    . feeding supplement, ENSURE ENLIVE, (ENSURE ENLIVE) LIQD Take 237 mLs by mouth 3 (three) times daily between meals. (Patient taking differently: Take 237 mLs by mouth  in the morning and at bedtime. ) 30 Bottle 12  . HYDROcodone-acetaminophen (NORCO/VICODIN) 5-325 MG tablet Take 1 tablet by mouth every 6 (six) hours as needed (hip pain.).     Marland Kitchen lansoprazole (PREVACID) 15 MG capsule Take 15 mg by mouth daily before breakfast.    . mometasone-formoterol (DULERA) 100-5 MCG/ACT AERO Inhale 2 puffs into the lungs 2 (two) times daily. 13 g 2  . Multiple Vitamin  (MULTIVITAMIN WITH MINERALS) TABS tablet Take 1 tablet by mouth daily. Multivitamin for Women 50+    . PLENVU 140 g SOLR Take 140 g by mouth once.    . sucralfate (CARAFATE) 1 g tablet Take 1 tablet (1 g total) by mouth 2 (two) times daily. 60 tablet 3  . tetrahydrozoline 0.05 % ophthalmic solution Place 1 drop into both eyes daily.    . lansoprazole (PREVACID SOLUTAB) 30 MG disintegrating tablet Take 1 tablet (30 mg total) by mouth daily at 12 noon. (Patient not taking: Reported on 07/29/2020) 60 tablet 3  . thiamine 100 MG tablet Take 1 tablet (100 mg total) by mouth daily. (Patient not taking: Reported on 07/24/2020) 30 tablet 2    Results for orders placed or performed during the hospital encounter of 07/30/20 (from the past 48 hour(s))  SARS CORONAVIRUS 2 (TAT 6-24 HRS) Nasopharyngeal Nasopharyngeal Swab     Status: None   Collection Time: 07/30/20  1:45 PM   Specimen: Nasopharyngeal Swab  Result Value Ref Range   SARS Coronavirus 2 NEGATIVE NEGATIVE    Comment: (NOTE) SARS-CoV-2 target nucleic acids are NOT DETECTED.  The SARS-CoV-2 RNA is generally detectable in upper and lower respiratory specimens during the acute phase of infection. Negative results do not preclude SARS-CoV-2 infection, do not rule out co-infections with other pathogens, and should not be used as the sole basis for treatment or other patient management decisions. Negative results must be combined with clinical observations, patient history, and epidemiological information. The expected result is Negative.  Fact Sheet for Patients: HairSlick.no  Fact Sheet for Healthcare Providers: quierodirigir.com  This test is not yet approved or cleared by the [place] FDA and  has been authorized for detection and/or diagnosis of SARS-CoV-2 by FDA under an Emergency Use Authorization (EUA). This EUA will remain  in effect (meaning this test can be used) for the  duration of the COVID-19 declaration under Se ction 564(b)(1) of the Act, 21 U.S.C. section 360bbb-3(b)(1), unless the authorization is terminated or revoked sooner.  Performed at [facility], [address] [place], [place] [address]   CBC with Differential/Platelet     Status: Abnormal   Collection Time: 07/30/20  1:56 PM  Result Value Ref Range   WBC 5.3 4.0 - 10.5 K/uL   RBC 3.43 (L) 3.87 - 5.11 MIL/uL   Hemoglobin 10.4 (L) 12.0 - 15.0 g/dL   HCT 16.3 (L) 36 - 46 %   MCV 98.5 80.0 - 100.0 fL   MCH 30.3 26.0 - 34.0 pg   MCHC 30.8 30.0 - 36.0 g/dL   RDW 84.6 (H) 65.9 - 93.5 %   Platelets 237 150 - 400 K/uL   nRBC 0.0 0.0 - 0.2 %   Neutrophils Relative % 51 %   Neutro Abs 2.7 1.7 - 7.7 K/uL   Lymphocytes Relative 38 %   Lymphs Abs 2.0 0.7 - 4.0 K/uL   Monocytes Relative 8 %   Monocytes Absolute 0.4 0 - 1 K/uL   Eosinophils Relative 2 %  Eosinophils Absolute 0.1 0 - 0 K/uL   Basophils Relative 1 %   Basophils Absolute 0.0 0 - 0 K/uL   Immature Granulocytes 0 %   Abs Immature Granulocytes 0.01 0.00 - 0.07 K/uL    Comment: Performed at [facility], [address] [place], [place] [address]  Basic metabolic panel     Status: Abnormal   Collection Time: 07/30/20  1:56 PM  Result Value Ref Range   Sodium 142 135 - 145 mmol/L   Potassium 3.0 (L) 3.5 - 5.1 mmol/L   Chloride 106 98 - 111 mmol/L   CO2 28 22 - 32 mmol/L   Glucose, Bld 104 (H) 70 - 99 mg/dL    Comment: Glucose reference range applies only to samples taken after fasting for at least 8 hours.   BUN 9 8 - 23 mg/dL   Creatinine, Ser 3.83 0.44 - 1.00 mg/dL   Calcium 8.4 (L) 8.9 - 10.3 mg/dL   GFR calc non Af Amer >60 >60 mL/min   GFR calc Af Amer >60 >60 mL/min   Anion gap 8 5 - 15    Comment: Performed at [facility], [address] [place], [place] [address]   No results found.  Review of Systems  Constitutional: Positive for unexpected weight change.  HENT: Negative.   Eyes: Negative.    Respiratory: Negative.   Cardiovascular: Negative.   Gastrointestinal: Positive for nausea and vomiting.  Endocrine: Negative.   Genitourinary: Negative.   Musculoskeletal: Negative.   Skin: Negative.   Allergic/Immunologic: Negative.   Neurological: Negative.   Hematological: Negative.   Psychiatric/Behavioral: Negative.     Blood pressure 123/62, pulse (!) 47, temperature 98 F (36.7 C), temperature source Oral, resp. rate 14, height 5\' 2"  (1.575 m), weight 32.7 kg, SpO2 99 %. Physical Exam  GENERAL: The patient is AO x3, in no acute distress.  Patient is severely underweight.  She has a metallic walking support. HEENT: Head is normocephalic and atraumatic. EOMI are intact. Mouth is well hydrated and without lesions.  Has bilateral temporal wasting. NECK: Supple. No masses LUNGS: Clear to auscultation. No presence of rhonchi/wheezing/rales. Adequate chest expansion HEART: RRR, normal s1 and s2. ABDOMEN: Soft, nontender, no guarding, no peritoneal signs, and nondistended. BS +. No masses. EXTREMITIES: Without any cyanosis, clubbing, rash, lesions or edema. Has diffuse muscle wasting. NEUROLOGIC: AOx3, no focal motor deficit. SKIN: no jaundice, no rashes  Assessment/Plan 64 y.o. female appendical history of COPD, GERD, peptic ulcer disease complicated by bleeding requiring partial gastrectomy with Billroth I reconstruction complicated by recurrent stricture at the anastomosis, coming to the hospital to undergo endoscopic evaluation and management of recurrent gastroduodenal stricture and colorectal cancer screening.  We will proceed with EGD with endoscopic balloon dilation of gastroduodenostomy and colonoscopy.  Dolores Frame, MD 08/01/2020, 7:38 AM

## 2020-08-01 NOTE — Op Note (Signed)
[facility] Patient Name: Kristine Roberts Procedure Date: 08/01/2020 7:01 AM MRN: [id number] Date of Birth: 10/07/1956 Attending MD: Katrinka Blazing ,  CSN: [id number] Age: 64 Admit Type: Outpatient Procedure:                Upper GI endoscopy Indications:              Gastric outlet obstruction Providers:                Katrinka Blazing, Angelica Ran, Edrick Kins, RN,                            Pandora Leiter, Technician Referring MD:              Medicines:                Monitored Anesthesia Care Complications:            No immediate complications. Estimated Blood Loss:     Estimated blood loss: none. Procedure:                Pre-Anesthesia Assessment:                           - Prior to the procedure, a History and Physical                            was performed, and patient medications, allergies                            and sensitivities were reviewed. The patient's                            tolerance of previous anesthesia was reviewed.                           - The risks and benefits of the procedure and the                            sedation options and risks were discussed with the                            patient. All questions were answered and informed                            consent was obtained.                           - ASA Grade Assessment: III - A patient with severe                            systemic disease.                           After obtaining informed consent, the endoscope was                            passed under direct vision. Throughout the  procedure, the patient's blood pressure, pulse, and                            oxygen saturations were monitored continuously. The                            [id number]-[id number] ([id number]) scope was introduced through the                            mouth, and advanced to the second part of duodenum.                            The upper GI endoscopy was accomplished without                             difficulty. The patient tolerated the procedure                            well. Scope In: 7:48:33 AM Scope Out: 8:14:28 AM Total Procedure Duration: 0 hours 25 minutes 55 seconds  Findings:      LA Grade C (one or more mucosal breaks continuous between tops of 2 or       more mucosal folds, less than 75% circumference) esophagitis with no       bleeding was found in the lower third of the esophagus.      Evidence of a stenosed Billroth I gastroduodenostomy was found. A       gastric pouch 6 cm wide was found containing a bezoar. The       gastroduodenal anastomosis was characterized by presence of a gastric       diverticula near the outlet. There was a clean based ulcer adjacent to       the anastomosis. The anastoosis was friable and edematous. This was       traversed with some resistance. A TTS dilator was passed through the       scope. Dilation with an 07-14-09 mm pyloric balloon dilator was performed       up to 10 mm. There was evidence of hematin at the end of the procedure.       Area was successfully injected with 2 mL Kenalog (40 mg) for stricture       prophylaxis.      The examined duodenum was normal. Impression:               - LA Grade C reflux esophagitis with no bleeding.                           - Stenosed Billroth I gastroduodenostomy was found,                            characterized by presence of a gastric diverticula                            near the outlet. There was a clean based ulcer  adjacent. Dilated. Injected.                           - Normal examined duodenum.                           - No specimens collected. Moderate Sedation:      Per Anesthesia Care Recommendation:           - Discharge patient to home (ambulatory).                           - Advance diet as tolerated.                           - Repeat upper endoscopy in 4 weeks to evaluate the                            response to therapy.                            - Use Prevacid (lansoprazole) 30 mg PO BID - open                            capsule to swallow.                           - Smoking cessation.                           - No ibuprofen, naproxen, or other non-steroidal                            anti-inflammatory drugs indefinitely. Procedure Code(s):        --- Professional ---                           [phone], GC, Esophagogastroduodenoscopy, flexible,                            transoral; with dilation of gastric/duodenal                            stricture(s) (eg, balloon, bougie) Diagnosis Code(s):        --- Professional ---                           K21.00, Gastro-esophageal reflux disease with                            esophagitis, without bleeding CPT copyright 2019 American Medical Association. All rights reserved. The codes documented in this report are preliminary and upon coder review may  be revised to meet current compliance requirements. Katrinka Blazing, MD Katrinka Blazing,  08/01/2020 8:50:21 AM This report has been signed electronically. Number of Addenda: 0

## 2020-08-01 NOTE — Transfer of Care (Signed)
Immediate Anesthesia Transfer of Care Note  Patient: Kristine Roberts  Procedure(s) Performed: COLONOSCOPY WITH PROPOFOL (N/A ) ESOPHAGOGASTRODUODENOSCOPY (EGD) WITH PROPOFOL (N/A ) ESOPHAGEAL DILATION (N/A )  Patient Location: PACU  Anesthesia Type:General  Level of Consciousness: awake, alert  and patient cooperative  Airway & Oxygen Therapy: Patient Spontanous Breathing and Patient connected to nasal cannula oxygen  Post-op Assessment: Report given to RN and Post -op Vital signs reviewed and stable  Post vital signs: Reviewed and stable  Last Vitals:  Vitals Value Taken Time  BP 80/53 08/01/20 0846  Temp    Pulse    Resp 16 08/01/20 0847  SpO2    Vitals shown include unvalidated device data.  Last Pain:  Vitals:   08/01/20 0742  TempSrc:   PainSc: 7       Patients Stated Pain Goal: 5 (08/01/20 5075)  Complications: No complications documented.

## 2020-08-01 NOTE — Op Note (Addendum)
[facility] Patient Name: Kristine Roberts Procedure Date: 08/01/2020 7:36 AM MRN: [id number] Date of Birth: 1956-09-14 Attending MD: Katrinka Blazing ,  CSN: [id number] Age: 64 Admit Type: Outpatient Procedure:                Colonoscopy Indications:              Screening for colorectal malignant neoplasm Providers:                Katrinka Blazing, Angelica Ran, Edrick Kins, RN,                            Pandora Leiter, Technician Referring MD:              Medicines:                Monitored Anesthesia Care Complications:            No immediate complications. Estimated Blood Loss:     Estimated blood loss: none. Procedure:                Pre-Anesthesia Assessment:                           - Prior to the procedure, a History and Physical                            was performed, and patient medications, allergies                            and sensitivities were reviewed. The patient's                            tolerance of previous anesthesia was reviewed.                           - The risks and benefits of the procedure and the                            sedation options and risks were discussed with the                            patient. All questions were answered and informed                            consent was obtained.                           - ASA Grade Assessment: III - A patient with severe                            systemic disease.                           After obtaining informed consent, the colonoscope                            was passed under direct vision. Throughout the  procedure, the patient's blood pressure, pulse, and                            oxygen saturations were monitored continuously. The                            [id number]-[id number] ([id number]) scope was introduced through                            the anus and advanced to the the cecum, identified                            by appendiceal orifice and ileocecal valve.  The                            colonoscopy was performed without difficulty. The                            patient tolerated the procedure well. The quality                            of the bowel preparation was excellent. Scope                            withdrawal time was 11 minutes. Scope In: 8:20:04 AM Scope Out: 8:38:52 AM Scope Withdrawal Time: 0 hours 9 minutes 1 second  Total Procedure Duration: 0 hours 18 minutes 48 seconds  Findings:      The perianal and digital rectal examinations were normal.      A few small-mouthed diverticula were found in the sigmoid colon and       descending colon.      The exam was otherwise without abnormality.      The retroflexed view of the distal rectum and anal verge was normal and       showed no anal or rectal abnormalities. Impression:               - Diverticulosis in the sigmoid colon and in the                            descending colon.                           - The examination was otherwise normal.                           - The distal rectum and anal verge are normal on                            retroflexion view.                           - No specimens collected. Moderate Sedation:      Per Anesthesia Care Recommendation:           - Discharge patient to home (ambulatory).                           -  Advance diet as tolerated.                           - Repeat colonoscopy in 10 years for screening                            purposes. Procedure Code(s):        --- Professional ---                           [phone], GC, Colonoscopy, flexible; diagnostic,                            including collection of specimen(s) by brushing or                            washing, when performed (separate procedure) Diagnosis Code(s):        --- Professional ---                           Z12.11, Encounter for screening for malignant                            neoplasm of colon                           K57.30, Diverticulosis of large  intestine without                            perforation or abscess without bleeding CPT copyright 2019 American Medical Association. All rights reserved. The codes documented in this report are preliminary and upon coder review may  be revised to meet current compliance requirements. Katrinka Blazing, MD Katrinka Blazing,  08/01/2020 8:53:46 AM This report has been signed electronically. Number of Addenda: 0

## 2020-08-01 NOTE — Anesthesia Procedure Notes (Signed)
Date/Time: 08/01/2020 7:41 AM Performed by: Franco Nones, CRNA Pre-anesthesia Checklist: Patient identified, Emergency Drugs available, Suction available, Timeout performed and Patient being monitored Patient Re-evaluated:Patient Re-evaluated prior to induction Oxygen Delivery Method: Nasal Cannula

## 2020-08-01 NOTE — Anesthesia Postprocedure Evaluation (Signed)
Anesthesia Post Note  Patient: Kristine Roberts  Procedure(s) Performed: COLONOSCOPY WITH PROPOFOL (N/A ) ESOPHAGOGASTRODUODENOSCOPY (EGD) WITH PROPOFOL (N/A ) ESOPHAGEAL DILATION (N/A )  Patient location during evaluation: PACU Anesthesia Type: General Level of consciousness: awake and alert and patient cooperative Pain management: satisfactory to patient Vital Signs Assessment: post-procedure vital signs reviewed and stable Respiratory status: spontaneous breathing Cardiovascular status: stable Postop Assessment: no apparent nausea or vomiting Anesthetic complications: no   No complications documented.   Last Vitals:  Vitals:   08/01/20 0651 08/01/20 0846  BP: 123/62 (!) 80/53  Pulse: (!) 47   Resp: 14 15  Temp: 36.7 C   SpO2: 99% (P) 100%    Last Pain:  Vitals:   08/01/20 0742  TempSrc:   PainSc: 7                  Dusti Tetro

## 2020-08-01 NOTE — Anesthesia Preprocedure Evaluation (Signed)
Anesthesia Evaluation  Patient identified by MRN, date of birth, ID band Patient awake    Reviewed: Allergy & Precautions, H&P , NPO status , Patient's Chart, lab work & pertinent test results, reviewed documented beta blocker date and time   Airway Mallampati: I  TM Distance: >3 FB Neck ROM: full    Dental no notable dental hx.    Pulmonary pneumonia, resolved, COPD,  COPD inhaler, Current Smoker,    Pulmonary exam normal breath sounds clear to auscultation       Cardiovascular Exercise Tolerance: Good (-) dysrhythmias  Rhythm:regular Rate:Normal     Neuro/Psych PSYCHIATRIC DISORDERS negative neurological ROS     GI/Hepatic Neg liver ROS, hiatal hernia, PUD, GERD  Medicated,  Endo/Other  negative endocrine ROS  Renal/GU negative Renal ROS  negative genitourinary   Musculoskeletal   Abdominal   Peds  Hematology  (+) Blood dyscrasia, anemia ,   Anesthesia Other Findings   Reproductive/Obstetrics negative OB ROS                             Anesthesia Physical Anesthesia Plan  ASA: III  Anesthesia Plan: General   Post-op Pain Management:    Induction:   PONV Risk Score and Plan: Propofol infusion  Airway Management Planned:   Additional Equipment:   Intra-op Plan:   Post-operative Plan:   Informed Consent: I have reviewed the patients History and Physical, chart, labs and discussed the procedure including the risks, benefits and alternatives for the proposed anesthesia with the patient or authorized representative who has indicated his/her understanding and acceptance.     Dental Advisory Given  Plan Discussed with: CRNA  Anesthesia Plan Comments:         Anesthesia Quick Evaluation

## 2020-08-04 ENCOUNTER — Other Ambulatory Visit (INDEPENDENT_AMBULATORY_CARE_PROVIDER_SITE_OTHER): Payer: Self-pay | Admitting: *Deleted

## 2020-08-04 ENCOUNTER — Other Ambulatory Visit (INDEPENDENT_AMBULATORY_CARE_PROVIDER_SITE_OTHER): Payer: Self-pay | Admitting: Gastroenterology

## 2020-08-04 ENCOUNTER — Encounter (INDEPENDENT_AMBULATORY_CARE_PROVIDER_SITE_OTHER): Payer: Self-pay | Admitting: *Deleted

## 2020-08-04 NOTE — Progress Notes (Signed)
Thanks Ann.  Braylen Staller Castaneda, MD Gastroenterology and Hepatology [facility]  

## 2020-08-04 NOTE — Progress Notes (Signed)
New instructions added to instruction sheet for patient

## 2020-08-04 NOTE — Progress Notes (Signed)
Room 3 please Please Let her know to only drink liquids two days before procedure, NO SOLIDS!  Katrinka Blazing, MD Gastroenterology and Hepatology [facility]

## 2020-08-07 ENCOUNTER — Encounter (HOSPITAL_COMMUNITY): Payer: Self-pay | Admitting: Gastroenterology

## 2020-08-07 NOTE — Addendum Note (Signed)
Addendum  created 08/07/20 0901 by Moshe Salisbury, CRNA   Charge Capture section accepted

## 2020-08-25 NOTE — Patient Instructions (Signed)
Kristine Roberts  08/25/2020     @PREFPERIOPPHARMACY @   Your procedure is scheduled on  08/29/2020.  Report to [facility] at  0830  A.M.  Call this number if you have problems the morning of surgery:  [phone]   Remember:  Follow the diet instructions given to you by the office.                     Take these medicines the morning of surgery with A SIP OF WATER  Hydrocodone(if needed), prevacid.    Do not wear jewelry, make-up or nail polish.  Do not wear lotions, powders, or perfumes. Please wear deodorant and brush your teeth.  Do not shave 48 hours prior to surgery.  Men may shave face and neck.  Do not bring valuables to the hospital.  [facility] is not responsible for any belongings or valuables.  Contacts, dentures or bridgework may not be worn into surgery.  Leave your suitcase in the car.  After surgery it may be brought to your room.  For patients admitted to the hospital, discharge time will be determined by your treatment team.  Patients discharged the day of surgery will not be allowed to drive home.   Name and phone number of your driver:   family Special instructions:   DO NOT smoke the morning of your procedure.  Please read over the following fact sheets that you were given. Anesthesia Post-op Instructions and Care and Recovery After Surgery       Upper Endoscopy, Adult, Care After This sheet gives you information about how to care for yourself after your procedure. Your health care provider may also give you more specific instructions. If you have problems or questions, contact your health care provider. What can I expect after the procedure? After the procedure, it is common to have:  A sore throat.  Mild stomach pain or discomfort.  Bloating.  Nausea. Follow these instructions at home:   Follow instructions from your health care provider about what to eat or drink after your procedure.  Return to your normal activities as told  by your health care provider. Ask your health care provider what activities are safe for you.  Take over-the-counter and prescription medicines only as told by your health care provider.  Do not drive for 24 hours if you were given a sedative during your procedure.  Keep all follow-up visits as told by your health care provider. This is important. Contact a health care provider if you have:  A sore throat that lasts longer than one day.  Trouble swallowing. Get help right away if:  You vomit blood or your vomit looks like coffee grounds.  You have: ? A fever. ? Bloody, black, or tarry stools. ? A severe sore throat or you cannot swallow. ? Difficulty breathing. ? Severe pain in your chest or abdomen. Summary  After the procedure, it is common to have a sore throat, mild stomach discomfort, bloating, and nausea.  Do not drive for 24 hours if you were given a sedative during the procedure.  Follow instructions from your health care provider about what to eat or drink after your procedure.  Return to your normal activities as told by your health care provider. This information is not intended to replace advice given to you by your health care provider. Make sure you discuss any questions you have with your health care provider. Document Revised: 05/16/2018 Document  Reviewed: 04/24/2018 Elsevier Patient Education  2020 [employer]  Esophageal Dilatation Esophageal dilatation, also called esophageal dilation, is a procedure to widen or open (dilate) a blocked or narrowed part of the esophagus. The esophagus is the part of the body that moves food and liquid from the mouth to the stomach. You may need this procedure if:  You have a buildup of scar tissue in your esophagus that makes it difficult, painful, or impossible to swallow. This can be caused by gastroesophageal reflux disease (GERD).  You have cancer of the esophagus.  There is a problem with how food moves through your  esophagus. In some cases, you may need this procedure repeated at a later time to dilate the esophagus gradually. Tell a health care provider about:  Any allergies you have.  All medicines you are taking, including vitamins, herbs, eye drops, creams, and over-the-counter medicines.  Any problems you or family members have had with anesthetic medicines.  Any blood disorders you have.  Any surgeries you have had.  Any medical conditions you have.  Any antibiotic medicines you are required to take before dental procedures.  Whether you are pregnant or may be pregnant. What are the risks? Generally, this is a safe procedure. However, problems may occur, including:  Bleeding due to a tear in the lining of the esophagus.  A hole (perforation) in the esophagus. What happens before the procedure?  Follow instructions from your health care provider about eating or drinking restrictions.  Ask your health care provider about changing or stopping your regular medicines. This is especially important if you are taking diabetes medicines or blood thinners.  Plan to have someone take you home from the hospital or clinic.  Plan to have a responsible adult care for you for at least 24 hours after you leave the hospital or clinic. This is important. What happens during the procedure?  You may be given a medicine to help you relax (sedative).  A numbing medicine may be sprayed into the back of your throat, or you may gargle the medicine.  Your health care provider may perform the dilatation using various surgical instruments, such as: ? Simple dilators. This instrument is carefully placed in the esophagus to stretch it. ? Guided wire bougies. This involves using an endoscope to insert a wire into the esophagus. A dilator is passed over this wire to enlarge the esophagus. Then the wire is removed. ? Balloon dilators. An endoscope with a small balloon at the end is inserted into the esophagus.  The balloon is inflated to stretch the esophagus and open it up. The procedure may vary among health care providers and hospitals. What happens after the procedure?  Your blood pressure, heart rate, breathing rate, and blood oxygen level will be monitored until the medicines you were given have worn off.  Your throat may feel slightly sore and numb. This will improve slowly over time.  You will not be allowed to eat or drink until your throat is no longer numb.  When you are able to drink, urinate, and sit on the edge of the bed without nausea or dizziness, you may be able to return home. Follow these instructions at home:  Take over-the-counter and prescription medicines only as told by your health care provider.  Do not drive for 24 hours if you were given a sedative during your procedure.  You should have a responsible adult with you for 24 hours after the procedure.  Follow instructions from your health  care provider about any eating or drinking restrictions.  Do not use any products that contain nicotine or tobacco, such as cigarettes and e-cigarettes. If you need help quitting, ask your health care provider.  Keep all follow-up visits as told by your health care provider. This is important. Get help right away if you:  Have a fever.  Have chest pain.  Have pain that is not relieved by medication.  Have trouble breathing.  Have trouble swallowing.  Vomit blood. Summary  Esophageal dilatation, also called esophageal dilation, is a procedure to widen or open (dilate) a blocked or narrowed part of the esophagus.  Plan to have someone take you home from the hospital or clinic.  For this procedure, a numbing medicine may be sprayed into the back of your throat, or you may gargle the medicine.  Do not drive for 24 hours if you were given a sedative during your procedure. This information is not intended to replace advice given to you by your health care provider. Make  sure you discuss any questions you have with your health care provider. Document Revised: 09/19/2019 Document Reviewed: 09/27/2017 Elsevier Patient Education  2020 [employer] Monitored Anesthesia Care, Care After These instructions provide you with information about caring for yourself after your procedure. Your health care provider may also give you more specific instructions. Your treatment has been planned according to current medical practices, but problems sometimes occur. Call your health care provider if you have any problems or questions after your procedure. What can I expect after the procedure? After your procedure, you may:  Feel sleepy for several hours.  Feel clumsy and have poor balance for several hours.  Feel forgetful about what happened after the procedure.  Have poor judgment for several hours.  Feel nauseous or vomit.  Have a sore throat if you had a breathing tube during the procedure. Follow these instructions at home: For at least 24 hours after the procedure:      Have a responsible adult stay with you. It is important to have someone help care for you until you are awake and alert.  Rest as needed.  Do not: ? Participate in activities in which you could fall or become injured. ? Drive. ? Use heavy machinery. ? Drink alcohol. ? Take sleeping pills or medicines that cause drowsiness. ? Make important decisions or sign legal documents. ? Take care of children on your own. Eating and drinking  Follow the diet that is recommended by your health care provider.  If you vomit, drink water, juice, or soup when you can drink without vomiting.  Make sure you have little or no nausea before eating solid foods. General instructions  Take over-the-counter and prescription medicines only as told by your health care provider.  If you have sleep apnea, surgery and certain medicines can increase your risk for breathing problems. Follow instructions from  your health care provider about wearing your sleep device: ? Anytime you are sleeping, including during daytime naps. ? While taking prescription pain medicines, sleeping medicines, or medicines that make you drowsy.  If you smoke, do not smoke without supervision.  Keep all follow-up visits as told by your health care provider. This is important. Contact a health care provider if:  You keep feeling nauseous or you keep vomiting.  You feel light-headed.  You develop a rash.  You have a fever. Get help right away if:  You have trouble breathing. Summary  For several hours after your procedure, you  may feel sleepy and have poor judgment.  Have a responsible adult stay with you for at least 24 hours or until you are awake and alert. This information is not intended to replace advice given to you by your health care provider. Make sure you discuss any questions you have with your health care provider. Document Revised: 02/20/2018 Document Reviewed: 03/14/2016 Elsevier Patient Education  2020 [employer].

## 2020-08-27 ENCOUNTER — Encounter (HOSPITAL_COMMUNITY): Payer: Self-pay

## 2020-08-27 ENCOUNTER — Other Ambulatory Visit: Payer: Self-pay

## 2020-08-27 ENCOUNTER — Other Ambulatory Visit (HOSPITAL_COMMUNITY)
Admission: RE | Admit: 2020-08-27 | Discharge: 2020-08-27 | Disposition: A | Discharge status: Home / Self Care | Payer: Medicare Other | Source: Ambulatory Visit | Attending: Gastroenterology | Admitting: Gastroenterology

## 2020-08-27 ENCOUNTER — Encounter (HOSPITAL_COMMUNITY)
Admission: RE | Admit: 2020-08-27 | Discharge: 2020-08-27 | Disposition: A | Discharge status: Home / Self Care | Payer: Medicare Other | Source: Ambulatory Visit | Attending: Gastroenterology | Admitting: Gastroenterology

## 2020-08-27 DIAGNOSIS — Z01812 Encounter for preprocedural laboratory examination: Secondary | ICD-10-CM | POA: Diagnosis present

## 2020-08-27 DIAGNOSIS — Z20822 Contact with and (suspected) exposure to covid-19: Secondary | ICD-10-CM | POA: Diagnosis not present

## 2020-08-27 LAB — SARS CORONAVIRUS 2 (TAT 6-24 HRS): SARS Coronavirus 2: NEGATIVE

## 2020-08-28 NOTE — OR Nursing (Signed)
K+ was 3.0 in august. Reported to Dr. Pilar Plate, Dr. Pilar Plate said she can eat a couple of bananas today. Called patient ,no answer but did leave a message on her phone.

## 2020-08-29 ENCOUNTER — Ambulatory Visit (HOSPITAL_COMMUNITY): Payer: Medicare Other | Admitting: Anesthesiology

## 2020-08-29 ENCOUNTER — Encounter (HOSPITAL_COMMUNITY)
Admission: RE | Disposition: A | Discharge status: Home / Self Care | Payer: Self-pay | Source: Home / Self Care | Attending: Gastroenterology

## 2020-08-29 ENCOUNTER — Encounter (HOSPITAL_COMMUNITY): Payer: Self-pay | Admitting: Gastroenterology

## 2020-08-29 ENCOUNTER — Ambulatory Visit (HOSPITAL_COMMUNITY)
Admission: RE | Admit: 2020-08-29 | Discharge: 2020-08-29 | Disposition: A | Discharge status: Home / Self Care | Payer: Medicare Other | Attending: Gastroenterology | Admitting: Gastroenterology

## 2020-08-29 DIAGNOSIS — K209 Esophagitis, unspecified without bleeding: Secondary | ICD-10-CM

## 2020-08-29 DIAGNOSIS — T85858A Stenosis due to other internal prosthetic devices, implants and grafts, initial encounter: Secondary | ICD-10-CM | POA: Diagnosis not present

## 2020-08-29 DIAGNOSIS — Z980 Intestinal bypass and anastomosis status: Secondary | ICD-10-CM | POA: Insufficient documentation

## 2020-08-29 DIAGNOSIS — Z880 Allergy status to penicillin: Secondary | ICD-10-CM | POA: Insufficient documentation

## 2020-08-29 DIAGNOSIS — K3189 Other diseases of stomach and duodenum: Secondary | ICD-10-CM | POA: Diagnosis present

## 2020-08-29 DIAGNOSIS — K315 Obstruction of duodenum: Secondary | ICD-10-CM

## 2020-08-29 DIAGNOSIS — Z681 Body mass index (BMI) 19 or less, adult: Secondary | ICD-10-CM | POA: Insufficient documentation

## 2020-08-29 DIAGNOSIS — Z79899 Other long term (current) drug therapy: Secondary | ICD-10-CM | POA: Diagnosis not present

## 2020-08-29 DIAGNOSIS — X58XXXA Exposure to other specified factors, initial encounter: Secondary | ICD-10-CM | POA: Insufficient documentation

## 2020-08-29 DIAGNOSIS — E43 Unspecified severe protein-calorie malnutrition: Secondary | ICD-10-CM | POA: Diagnosis not present

## 2020-08-29 DIAGNOSIS — Z96643 Presence of artificial hip joint, bilateral: Secondary | ICD-10-CM | POA: Diagnosis not present

## 2020-08-29 DIAGNOSIS — K2100 Gastro-esophageal reflux disease with esophagitis, without bleeding: Secondary | ICD-10-CM | POA: Insufficient documentation

## 2020-08-29 DIAGNOSIS — Z8711 Personal history of peptic ulcer disease: Secondary | ICD-10-CM | POA: Diagnosis not present

## 2020-08-29 DIAGNOSIS — J449 Chronic obstructive pulmonary disease, unspecified: Secondary | ICD-10-CM | POA: Insufficient documentation

## 2020-08-29 DIAGNOSIS — F17210 Nicotine dependence, cigarettes, uncomplicated: Secondary | ICD-10-CM | POA: Insufficient documentation

## 2020-08-29 DIAGNOSIS — T18128A Food in esophagus causing other injury, initial encounter: Secondary | ICD-10-CM | POA: Diagnosis not present

## 2020-08-29 HISTORY — PX: ESOPHAGOGASTRODUODENOSCOPY (EGD) WITH PROPOFOL: SHX5813

## 2020-08-29 SURGERY — ESOPHAGOGASTRODUODENOSCOPY (EGD) WITH PROPOFOL
Anesthesia: General

## 2020-08-29 MED ORDER — LIDOCAINE VISCOUS HCL 2 % MT SOLN
15.0000 mL | Freq: Once | OROMUCOSAL | Status: AC
  Administered 2020-08-29: 15 mL via OROMUCOSAL

## 2020-08-29 MED ORDER — PROPOFOL 10 MG/ML IV BOLUS
INTRAVENOUS | Status: DC | PRN
  Administered 2020-08-29: 50 mg via INTRAVENOUS

## 2020-08-29 MED ORDER — LACTATED RINGERS IV SOLN: INTRAVENOUS | Status: DC | PRN

## 2020-08-29 MED ORDER — LACTATED RINGERS IV SOLN: Freq: Once | INTRAVENOUS | Status: AC

## 2020-08-29 MED ORDER — LIDOCAINE VISCOUS HCL 2 % MT SOLN
OROMUCOSAL | Status: AC
  Filled 2020-08-29: qty 15

## 2020-08-29 MED ORDER — GLYCOPYRROLATE 0.2 MG/ML IJ SOLN
INTRAMUSCULAR | Status: AC
  Filled 2020-08-29: qty 1

## 2020-08-29 MED ORDER — GLYCOPYRROLATE 0.2 MG/ML IJ SOLN
0.2000 mg | Freq: Once | INTRAMUSCULAR | Status: AC
  Administered 2020-08-29: 0.2 mg via INTRAVENOUS

## 2020-08-29 MED ORDER — PROPOFOL 500 MG/50ML IV EMUL
INTRAVENOUS | Status: DC | PRN
  Administered 2020-08-29: 100 ug/kg/min via INTRAVENOUS

## 2020-08-29 MED ORDER — STERILE WATER FOR IRRIGATION IR SOLN
Status: DC | PRN
  Administered 2020-08-29: 1.5 mL

## 2020-08-29 NOTE — Discharge Instructions (Signed)
You are being discharged to home.  Resume your previous diet.  Your physician has recommended a repeat upper endoscopy at the next available appointment for retreatment, as there was significant amount of food in stomach. PLEASE DO NOT EAT ANY SOLID FOOD (ONLY LIQUIDS) TWO DAYS PRIOR TO THE PROCEDURE. Continue your present medications, including Prevacid (open capsule).  Do not take any aspirin, ibuprofen (including Advil, Motrin or Nuprin), naproxen (including Aleve), or any other non-steroidal anti-inflammatory drugs.  Smoking cessation.   Monitored Anesthesia Care, Care After These instructions provide you with information about caring for yourself after your procedure. Your health care provider may also give you more specific instructions. Your treatment has been planned according to current medical practices, but problems sometimes occur. Call your health care provider if you have any problems or questions after your procedure. What can I expect after the procedure? After your procedure, you may:  Feel sleepy for several hours.  Feel clumsy and have poor balance for several hours.  Feel forgetful about what happened after the procedure.  Have poor judgment for several hours.  Feel nauseous or vomit.  Have a sore throat if you had a breathing tube during the procedure. Follow these instructions at home: For at least 24 hours after the procedure:      Have a responsible adult stay with you. It is important to have someone help care for you until you are awake and alert.  Rest as needed.  Do not: ? Participate in activities in which you could fall or become injured. ? Drive. ? Use heavy machinery. ? Drink alcohol. ? Take sleeping pills or medicines that cause drowsiness. ? Make important decisions or sign legal documents. ? Take care of children on your own. Eating and drinking  Follow the diet that is recommended by your health care provider.  If you vomit, drink  water, juice, or soup when you can drink without vomiting.  Make sure you have little or no nausea before eating solid foods. General instructions  Take over-the-counter and prescription medicines only as told by your health care provider.  If you have sleep apnea, surgery and certain medicines can increase your risk for breathing problems. Follow instructions from your health care provider about wearing your sleep device: ? Anytime you are sleeping, including during daytime naps. ? While taking prescription pain medicines, sleeping medicines, or medicines that make you drowsy.  If you smoke, do not smoke without supervision.  Keep all follow-up visits as told by your health care provider. This is important. Contact a health care provider if:  You keep feeling nauseous or you keep vomiting.  You feel light-headed.  You develop a rash.  You have a fever. Get help right away if:  You have trouble breathing. Summary  For several hours after your procedure, you may feel sleepy and have poor judgment.  Have a responsible adult stay with you for at least 24 hours or until you are awake and alert. This information is not intended to replace advice given to you by your health care provider. Make sure you discuss any questions you have with your health care provider. Document Revised: 02/20/2018 Document Reviewed: 03/14/2016 Elsevier Patient Education  2020 [employer].  Steps to Quit Smoking Smoking tobacco is the leading cause of preventable death. It can affect almost every organ in the body. Smoking puts you and those around you at risk for developing many serious chronic diseases. Quitting smoking can be difficult, but it is one  of the best things that you can do for your health. It is never too late to quit. How do I get ready to quit? When you decide to quit smoking, create a plan to help you succeed. Before you quit:  Pick a date to quit. Set a date within the next 2 weeks to  give you time to prepare.  Write down the reasons why you are quitting. Keep this list in places where you will see it often.  Tell your family, friends, and co-workers that you are quitting. Support from your loved ones can make quitting easier.  Talk with your health care provider about your options for quitting smoking.  Find out what treatment options are covered by your health insurance.  Identify people, places, things, and activities that make you want to smoke (triggers). Avoid them. What first steps can I take to quit smoking?  Throw away all cigarettes at home, at work, and in your car.  Throw away smoking accessories, such as Set designer.  Clean your car. Make sure to empty the ashtray.  Clean your home, including curtains and carpets. What strategies can I use to quit smoking? Talk with your health care provider about combining strategies, such as taking medicines while you are also receiving in-person counseling. Using these two strategies together makes you more likely to succeed in quitting than if you used either strategy on its own.  If you are pregnant or breastfeeding, talk with your health care provider about finding counseling or other support strategies to quit smoking. Do not take medicine to help you quit smoking unless your health care provider tells you to do so. To quit smoking: Quit right away  Quit smoking completely, instead of gradually reducing how much you smoke over a period of time. Research shows that stopping smoking right away is more successful than gradually quitting.  Attend in-person counseling to help you build problem-solving skills. You are more likely to succeed in quitting if you attend counseling sessions regularly. Even short sessions of 10 minutes can be effective. Take medicine You may take medicines to help you quit smoking. Some medicines require a prescription and some you can purchase over-the-counter. Medicines may have  nicotine in them to replace the nicotine in cigarettes. Medicines may:  Help to stop cravings.  Help to relieve withdrawal symptoms. Your health care provider may recommend:  Nicotine patches, gum, or lozenges.  Nicotine inhalers or sprays.  Non-nicotine medicine that is taken by mouth. Find resources Find resources and support systems that can help you to quit smoking and remain smoke-free after you quit. These resources are most helpful when you use them often. They include:  Online chats with a Veterinary surgeon.  Telephone quitlines.  Printed Materials engineer.  Support groups or group counseling.  Text messaging programs.  Mobile phone apps or applications. Use apps that can help you stick to your quit plan by providing reminders, tips, and encouragement. There are many free apps for mobile devices as well as websites. Examples include Quit Guide from the [employer] and smokefree.gov What things can I do to make it easier to quit?   Reach out to your family and friends for support and encouragement. Call telephone quitlines (1-800-QUIT-NOW), reach out to support groups, or work with a counselor for support.  Ask people who smoke to avoid smoking around you.  Avoid places that trigger you to smoke, such as bars, parties, or smoke-break areas at work.  Spend time with people who do  not smoke.  Lessen the stress in your life. Stress can be a smoking trigger for some people. To lessen stress, try: ? Exercising regularly. ? Doing deep-breathing exercises. ? Doing yoga. ? Meditating. ? Performing a body scan. This involves closing your eyes, scanning your body from head to toe, and noticing which parts of your body are particularly tense. Try to relax the muscles in those areas. How will I feel when I quit smoking? Day 1 to 3 weeks Within the first 24 hours of quitting smoking, you may start to feel withdrawal symptoms. These symptoms are usually most noticeable 2-3 days after  quitting, but they usually do not last for more than 2-3 weeks. You may experience these symptoms:  Mood swings.  Restlessness, anxiety, or irritability.  Trouble concentrating.  Dizziness.  Strong cravings for sugary foods and nicotine.  Mild weight gain.  Constipation.  Nausea.  Coughing or a sore throat.  Changes in how the medicines that you take for unrelated issues work in your body.  Depression.  Trouble sleeping (insomnia). Week 3 and afterward After the first 2-3 weeks of quitting, you may start to notice more positive results, such as:  Improved sense of smell and taste.  Decreased coughing and sore throat.  Slower heart rate.  Lower blood pressure.  Clearer skin.  The ability to breathe more easily.  Fewer sick days. Quitting smoking can be very challenging. Do not get discouraged if you are not successful the first time. Some people need to make many attempts to quit before they achieve long-term success. Do your best to stick to your quit plan, and talk with your health care provider if you have any questions or concerns. Summary  Smoking tobacco is the leading cause of preventable death. Quitting smoking is one of the best things that you can do for your health.  When you decide to quit smoking, create a plan to help you succeed.  Quit smoking right away, not slowly over a period of time.  When you start quitting, seek help from your health care provider, family, or friends. This information is not intended to replace advice given to you by your health care provider. Make sure you discuss any questions you have with your health care provider. Document Revised: 08/17/2019 Document Reviewed: 02/10/2019 Elsevier Patient Education  2020 [employer]  Upper Endoscopy, Adult, Care After This sheet gives you information about how to care for yourself after your procedure. Your health care provider may also give you more specific instructions. If you  have problems or questions, contact your health care provider. What can I expect after the procedure? After the procedure, it is common to have:  A sore throat.  Mild stomach pain or discomfort.  Bloating.  Nausea. Follow these instructions at home:   Follow instructions from your health care provider about what to eat or drink after your procedure.  Return to your normal activities as told by your health care provider. Ask your health care provider what activities are safe for you.  Take over-the-counter and prescription medicines only as told by your health care provider.  Do not drive for 24 hours if you were given a sedative during your procedure.  Keep all follow-up visits as told by your health care provider. This is important. Contact a health care provider if you have:  A sore throat that lasts longer than one day.  Trouble swallowing. Get help right away if:  You vomit blood or your vomit looks like  coffee grounds.  You have: ? A fever. ? Bloody, black, or tarry stools. ? A severe sore throat or you cannot swallow. ? Difficulty breathing. ? Severe pain in your chest or abdomen. Summary  After the procedure, it is common to have a sore throat, mild stomach discomfort, bloating, and nausea.  Do not drive for 24 hours if you were given a sedative during the procedure.  Follow instructions from your health care provider about what to eat or drink after your procedure.  Return to your normal activities as told by your health care provider. This information is not intended to replace advice given to you by your health care provider. Make sure you discuss any questions you have with your health care provider. Document Revised: 05/16/2018 Document Reviewed: 04/24/2018 Elsevier Patient Education  2020 [employer].

## 2020-08-29 NOTE — Anesthesia Preprocedure Evaluation (Signed)
Anesthesia Evaluation  Patient identified by MRN, date of birth, ID band Patient awake    Reviewed: Allergy & Precautions, NPO status , Patient's Chart, lab work & pertinent test results  History of Anesthesia Complications Negative for: history of anesthetic complications  Airway Mallampati: II  TM Distance: >3 FB Neck ROM: Full    Dental  (+) Edentulous Upper, Edentulous Lower   Pulmonary pneumonia, resolved, COPD,  COPD inhaler, Current SmokerPatient did not abstain from smoking.,    breath sounds clear to auscultation       Cardiovascular + dysrhythmias  Rhythm:Regular Rate:Normal     Neuro/Psych PSYCHIATRIC DISORDERS    GI/Hepatic Neg liver ROS, hiatal hernia, PUD, GERD  Medicated,  Endo/Other  negative endocrine ROS  Renal/GU negative Renal ROS  negative genitourinary   Musculoskeletal negative musculoskeletal ROS (+)   Abdominal   Peds  Hematology  (+) anemia ,   Anesthesia Other Findings Severe malnutrition   Reproductive/Obstetrics negative OB ROS                             Anesthesia Physical Anesthesia Plan  ASA: III  Anesthesia Plan: General   Post-op Pain Management:    Induction: Intravenous  PONV Risk Score and Plan: TIVA  Airway Management Planned: Nasal Cannula and Natural Airway  Additional Equipment:   Intra-op Plan:   Post-operative Plan:   Informed Consent: I have reviewed the patients History and Physical, chart, labs and discussed the procedure including the risks, benefits and alternatives for the proposed anesthesia with the patient or authorized representative who has indicated his/her understanding and acceptance.       Plan Discussed with: CRNA and Surgeon  Anesthesia Plan Comments:         Anesthesia Quick Evaluation

## 2020-08-29 NOTE — H&P (Signed)
Kristine Roberts is an 64 y.o. female.   Chief Complaint: gastric outlet obstruction HPI: 64 y.o.femaleappendical history of COPD, GERD, peptic ulcer disease complicated by bleeding requiring partial gastrectomy with Billroth I reconstruction complicated by recurrent stricture at the anastomosis, coming to the hospital to undergo endoscopic evaluation and management of recurrent gastroduodenal stricture.  The patient has had multiple endoscopic balloon dilations for her gastrojejunal stricture, last procedure was performed on 08/01/2020 with adequate dilation up to 10 mm, was also injected with Kenalog (2 mL) at anastomosis site.   Patient reports feeling better, still vomiting but less than before, 1-2 times per day. Has not lost any more weight. Still smoking but has not taken any NSAIDs or BC powders. The patient denies having any  fever, chills, hematochezia, melena, hematemesis, abdominal pain, diarrhea, jaundice, pruritus or weight loss.   Past Medical History:  Diagnosis Date  . COPD (chronic obstructive pulmonary disease) (HCC)   . Dysphagia   . Esophagitis   . Gastric outlet obstruction   . Gastric stenosis   . GERD (gastroesophageal reflux disease)   . Hiatal hernia   . PUD (peptic ulcer disease)   . Wounds, multiple     Past Surgical History:  Procedure Laterality Date  . APPENDECTOMY    . BALLOON DILATION N/A 03/05/2016   Procedure: BALLOON DILATION;  Surgeon: Malissa Hippo, MD;  [facility];  Service: Endoscopy;  Laterality: N/A;  pyloric channel dialtaion  . BALLOON DILATION N/A 07/14/2018   Procedure: BALLOON DILATION;  Surgeon: Lynann Bologna, MD;  Location: [facility] ENDOSCOPY;  Service: Endoscopy;  Laterality: N/A;  . BIOPSY  07/14/2018   Procedure: BIOPSY;  Surgeon: Lynann Bologna, MD;  Location: [facility] ENDOSCOPY;  Service: Endoscopy;;  . BIOPSY  07/03/2020   Procedure: BIOPSY;  Surgeon: Shellia Cleverly, DO;  Location: [facility] ENDOSCOPY;  Service: Gastroenterology;;  .  CHOLECYSTECTOMY    . COLONOSCOPY WITH PROPOFOL N/A 08/01/2020   Procedure: COLONOSCOPY WITH PROPOFOL;  Surgeon: Dolores Frame, MD;  [facility]: [facility];  Service: Gastroenterology;  Laterality: N/A;  1130  . ESOPHAGEAL DILATION N/A 03/03/2016   Procedure: ESOPHAGEAL DILATION;  Surgeon: Malissa Hippo, MD;  [facility];  Service: Endoscopy;  Laterality: N/A;  . ESOPHAGEAL DILATION N/A 05/07/2016   Procedure: ESOPHAGEAL DILATION;  Surgeon: Malissa Hippo, MD;  [facility];  Service: Endoscopy;  Laterality: N/A;  . ESOPHAGEAL DILATION  01/26/2018   Procedure: DILATION OF ANASTOMOTIC  STRICTURE;  Surgeon: Malissa Hippo, MD;  [facility];  Service: Endoscopy;;  . ESOPHAGEAL DILATION N/A 08/01/2020   Procedure: ESOPHAGEAL DILATION;  Surgeon: Dolores Frame, MD;  [facility];  Service: Gastroenterology;  Laterality: N/A;  . ESOPHAGOGASTRODUODENOSCOPY N/A 03/03/2016   Procedure: ESOPHAGOGASTRODUODENOSCOPY (EGD);  Surgeon: Malissa Hippo, MD;  [facility];  Service: Endoscopy;  Laterality: N/A;  2:00  . ESOPHAGOGASTRODUODENOSCOPY N/A 05/07/2016   Procedure: ESOPHAGOGASTRODUODENOSCOPY (EGD);  Surgeon: Malissa Hippo, MD;  [facility];  Service: Endoscopy;  Laterality: N/A;  855 - moved to 6/2 @ 10:15 - Ann notified pt  . ESOPHAGOGASTRODUODENOSCOPY N/A 12/28/2017   Procedure: ESOPHAGOGASTRODUODENOSCOPY (EGD) with stricture dilation;  Surgeon: Malissa Hippo, MD;  [facility];  Service: Endoscopy;  Laterality: N/A;  . ESOPHAGOGASTRODUODENOSCOPY N/A 01/26/2018   Procedure: ESOPHAGOGASTRODUODENOSCOPY (EGD);  Surgeon: Malissa Hippo, MD;  [facility];  Service: Endoscopy;  Laterality: N/A;  255  . ESOPHAGOGASTRODUODENOSCOPY  N/A 07/14/2018   Procedure: ESOPHAGOGASTRODUODENOSCOPY (EGD);  Surgeon: Lynann Bologna, MD;  Location[facility] END[facility]COPY;  Service: Endoscopy;  Laterality: N/A;  .  ESOPHAGOGASTRODUODENOSCOPY (EGD) WITH PROPOFOL N/A 03/05/2016   Procedure: ESOPHAGOGASTRODUODENOSCOPY (EGD) WITH PROPOFOL Anastomotic stricture dilation ;  Surgeon: Malissa Hippo, MD;  [facility];  Service: Endoscopy;  Laterality: N/A;  to be done in OR under fluoro  . ESOPHAGOGASTRODUODENOSCOPY (EGD) WITH PROPOFOL N/A 08/22/2019   Procedure: ESOPHAGOGASTRODUODENOSCOPY (EGD) WITH PROPOFOL With anastomotic stricture dilation.;  Surgeon: Malissa Hippo, MD;  [facility];  Service: Endoscopy;  Laterality: N/A;  . ESOPHAGOGASTRODUODENOSCOPY (EGD) WITH PROPOFOL N/A 08/24/2019   Procedure: ESOPHAGOGASTRODUODENOSCOPY (EGD) WITH PROPOFOL with stricture dilation;  Surgeon: Malissa Hippo, MD;  [facility];  Service: Endoscopy;  Laterality: N/A;  . ESOPHAGOGASTRODUODENOSCOPY (EGD) WITH PROPOFOL N/A 07/03/2020   Procedure: ESOPHAGOGASTRODUODENOSCOPY (EGD) WITH PROPOFOL;  Surgeon: Shellia Cleverly, DO;  Location: [facility];  Service: Gastroenterology;  Laterality: N/A;  . ESOPHAGOGASTRODUODENOSCOPY (EGD) WITH PROPOFOL N/A 08/01/2020   Procedure: ESOPHAGOGASTRODUODENOSCOPY (EGD) WITH PROPOFOL;  Surgeon: Dolores Frame, MD;  [facility];  Service: Gastroenterology;  Laterality: N/A;  . FOREIGN BODY REMOVAL  07/03/2020   Procedure: FOREIGN BODY REMOVAL;  Surgeon: Shellia Cleverly, DO;  Location: [facility];  Service: Gastroenterology;;  . INTRAMEDULLARY (IM) NAIL INTERTROCHANTERIC Right 07/09/2018   Procedure: INTRAMEDULLARY (IM) NAIL INTERTROCHANTRIC;  Surgeon: Yolonda Kida, MD;  Location: [facility] OR;  Service: Orthopedics;  Laterality: Right;  . INTRAMEDULLARY (IM) NAIL INTERTROCHANTERIC Left 06/27/2020   Procedure: INTRAMEDULLARY (IM) NAIL INTERTROCHANTRIC;  Surgeon: Yolonda Kida, MD;  Location: [facility] OR;  Service: Orthopedics;  Laterality: Left;  . KENALOG INJECTION  07/03/2020   Procedure: KENALOG INJECTION;  Surgeon: Shellia Cleverly, DO;  Location: [facility];  Service: Gastroenterology;;  . STOMACH SURGERY    . TOTAL HIP ARTHROPLASTY Bilateral     Family History  Problem Relation Age of Onset  . Alzheimer's disease Mother   . COPD Mother   . Throat cancer Father   . Breast cancer Sister    Social History:  reports that she has been smoking cigarettes. She has a 30.00 pack-year smoking history. She has never used smokeless tobacco. She reports previous alcohol use of about 12.0 standard drinks of alcohol per week. She reports that she does not use drugs.  Allergies:  Allergies  Allergen Reactions  . Penicillins Itching, Swelling and Other (See Comments)    Tongue swells Has patient had a PCN reaction causing immediate rash, facial/tongue/throat swelling, SOB or lightheadedness with hypotension: Yes, had a rash and tongue swelled up. No SOB and lightheadedness. Has patient had a PCN reaction causing severe rash involving mucus membranes or skin necrosis: No Has patient had a PCN reaction that required hospitalization: No Has patient had a PCN reaction occurring within the last 10 years: No If all of the above answers are "NO", then may procee    Medications Prior to Admission  Medication Sig Dispense Refill  . acetaminophen (TYLENOL) 500 MG tablet Take 1,000 mg by mouth every 6 (six) hours as needed for moderate pain or headache.    Marland Kitchen aspirin EC 325 MG EC tablet Take 1 tablet (325 mg total) by mouth daily with breakfast. (Patient taking differently: Take 650 mg by mouth daily as needed for pain. ) 30 tablet 0  . baclofen (LIORESAL) 10 MG tablet Take 10 mg by mouth at bedtime.    . Cyanocobalamin (B-12  COMPLIANCE INJECTION IJ) Inject 1 Dose as directed every 30 (thirty) days.    Marland Kitchen EPINEPHrine (PRIMATENE MIST) 0.125 MG/ACT AERO Inhale 1-2 puffs into the lungs daily as needed (shortness of breath).    Marland Kitchen HYDROcodone-acetaminophen (NORCO/VICODIN) 5-325 MG tablet Take 1 tablet by mouth every 6 (six) hours as needed  (hip pain.).     Marland Kitchen lactose free nutrition (BOOST) LIQD Take 237 mLs by mouth 2 (two) times daily.    . lansoprazole (PREVACID) 15 MG capsule Take 15 mg by mouth daily.    . Multiple Vitamin (MULTIVITAMIN WITH MINERALS) TABS tablet Take 1 tablet by mouth daily. Multivitamin for Women 50+    . Tetrahydrozoline HCl (VISINE OP) Place 1 drop into both eyes daily.    . traZODone (DESYREL) 100 MG tablet Take 100 mg by mouth at bedtime.    . lansoprazole (PREVACID SOLUTAB) 30 MG disintegrating tablet Take 1 tablet (30 mg total) by mouth 2 (two) times daily. (Patient not taking: Reported on 08/21/2020) 60 tablet 2  . mometasone-formoterol (DULERA) 100-5 MCG/ACT AERO Inhale 2 puffs into the lungs 2 (two) times daily. (Patient not taking: Reported on 08/21/2020) 13 g 2  . PLENVU 140 g SOLR Take 140 g by mouth once.    . sucralfate (CARAFATE) 1 g tablet Take 1 tablet (1 g total) by mouth 2 (two) times daily. (Patient taking differently: Take 1 g by mouth 4 (four) times daily. ) 60 tablet 3    Results for orders placed or performed during the hospital encounter of 08/27/20 (from the past 48 hour(s))  SARS CORONAVIRUS 2 (TAT 6-24 HRS) Nasopharyngeal Nasopharyngeal Swab     Status: None   Collection Time: 08/27/20 10:03 AM   Specimen: Nasopharyngeal Swab  Result Value Ref Range   SARS Coronavirus 2 NEGATIVE NEGATIVE    Comment: (NOTE) SARS-CoV-2 target nucleic acids are NOT DETECTED.  The SARS-CoV-2 RNA is generally detectable in upper and lower respiratory specimens during the acute phase of infection. Negative results do not preclude SARS-CoV-2 infection, do not rule out co-infections with other pathogens, and should not be used as the sole basis for treatment or other patient management decisions. Negative results must be combined with clinical observations, patient history, and epidemiological information. The expected result is Negative.  Fact Sheet for  Patients: HairSlick.no  Fact Sheet for Healthcare Providers: quierodirigir.com  This test is not yet approved or cleared by the [place] FDA and  has been authorized for detection and/or diagnosis of SARS-CoV-2 by FDA under an Emergency Use Authorization (EUA). This EUA will remain  in effect (meaning this test can be used) for the duration of the COVID-19 declaration under Se ction 564(b)(1) of the Act, 21 U.S.C. section 360bbb-3(b)(1), unless the authorization is terminated or revoked sooner.  Performed at [facility], [address] [place], [place] [address]    No results found.  Review of Systems  Constitutional: Negative.   HENT: Negative.   Eyes: Negative.   Respiratory: Negative.   Cardiovascular: Negative.   Gastrointestinal: Positive for abdominal distention and vomiting.  Endocrine: Negative.   Genitourinary: Negative.   Musculoskeletal: Negative.   Skin: Negative.   Allergic/Immunologic: Negative.   Neurological: Negative.   Hematological: Negative.   Psychiatric/Behavioral: Negative.     Blood pressure 119/66, pulse (!) 44, temperature 97.6 F (36.4 C), temperature source Oral, resp. rate 10, height 5\' 2"  (1.575 m), weight 32.7 kg, SpO2 100 %. Physical Exam  GENERAL: The patient is AO x3,  in no acute distress. Malnourished. HEENT: Head is normocephalic and atraumatic. EOMI are intact. Mouth is well hydrated and without lesions. NECK: Supple. No masses LUNGS: Clear to auscultation. No presence of rhonchi/wheezing/rales. Adequate chest expansion HEART: RRR, normal s1 and s2. ABDOMEN: Soft, nontender, no guarding, no peritoneal signs, and nondistended. BS +. No masses. EXTREMITIES: Without any cyanosis, clubbing, rash, lesions or edema. NEUROLOGIC: AOx3, no focal motor deficit. SKIN: no jaundice, no rashes  Assessment/Plan 63 y.o.femaleappendical history of COPD, GERD, peptic ulcer disease  complicated by bleeding requiring partial gastrectomy with Billroth I reconstruction complicated by recurrent stricture at the anastomosis, coming to the hospital to undergo endoscopic evaluation and management of recurrent gastroduodenal stricture. We will proceed with EGD with possible balloon dilation of anastomosis.  Dolores Frame, MD 08/29/2020, 9:44 AM

## 2020-08-29 NOTE — Op Note (Signed)
[facility] Patient Name: Kristine Roberts Procedure Date: 08/29/2020 10:09 AM MRN: [id number] Date of Birth: 06-27-1956 Attending MD: Katrinka Blazing ,  CSN: [id number] Age: 64 Admit Type: Outpatient Procedure:                Upper GI endoscopy Indications:              Gastroduodenal stricture Providers:                Katrinka Blazing, Angelica Ran, Dyann Ruddle Referring MD:              Medicines:                Monitored Anesthesia Care Complications:            No immediate complications. Estimated Blood Loss:     Estimated blood loss: none. Procedure:                Pre-Anesthesia Assessment:                           - Prior to the procedure, a History and Physical                            was performed, and patient medications, allergies                            and sensitivities were reviewed. The patient's                            tolerance of previous anesthesia was reviewed.                           - The risks and benefits of the procedure and the                            sedation options and risks were discussed with the                            patient. All questions were answered and informed                            consent was obtained.                           - ASA Grade Assessment: IV - A patient with severe                            systemic disease that is a constant threat to life.                           After obtaining informed consent, the endoscope was                            passed under direct vision. Throughout the  procedure, the patient's blood pressure, pulse, and                            oxygen saturations were monitored continuously. The                            GIF-H190 ([id number]) scope was introduced through the                            mouth, and advanced to the third part of duodenum.                            The patient tolerated the procedure well. The upper                             GI endoscopy was technically difficult and complex                            due to presence of food. Scope In: 10:29:13 AM Scope Out: 10:31:53 AM Total Procedure Duration: 0 hours 2 minutes 40 seconds  Findings:      Scant amount of solid food was found in the lower third of the esophagus.      LA Grade B (one or more mucosal breaks greater than 5 mm, not extending       between the tops of two mucosal folds) esophagitis with no bleeding was       found in the lower third of the esophagus.      A large amount of food (residue) was found in the gastric body.      Evidence of a stenosed Billroth II gastrojejunostomy was found. The       gastrojejunal anastomosis was characterized by mild stenosis and       erythema. This was traversed. The efferent limb was examined. The       afferent limb was examined. Both limbs were normal.      The examined duodenum was normal. Impression:               - Food in the lower third of the esophagus.                           - LA Grade B esophagitis with no bleeding.                           - A large amount of food (residue) in the stomach.                           - Stenosed Billroth II gastrojejunostomy was found,                            characterized by mild stenosis.                           - Normal examined duodenum.                           -  No specimens collected. Moderate Sedation:      Per Anesthesia Care Recommendation:           - Discharge patient to home (ambulatory).                           - Resume previous diet.                           - Repeat upper endoscopy at the next available                            appointment for retreatment, as there was                            significant amount of food in stomach. PLEASE DO                            NOT EAT ANY SOLID FOOD (ONLY LIQUIDS) TWO DAYS                            PRIOR TO THE PROCEDURE.                           - Continue present medications, including  Prevacid                            (open capsule).                           - No aspirin, ibuprofen, naproxen, or other                            non-steroidal anti-inflammatory drugs.                           - Smoking cessation. Procedure Code(s):        --- Professional ---                           [phone], GC, Esophagogastroduodenoscopy, flexible,                            transoral; diagnostic, including collection of                            specimen(s) by brushing or washing, when performed                            (separate procedure) Diagnosis Code(s):        --- Professional ---                           Q03.474Q, Food in esophagus causing other injury,                            initial encounter  K20.90, Esophagitis, unspecified without bleeding                           Z98.0, Intestinal bypass and anastomosis status CPT copyright 2019 American Medical Association. All rights reserved. The codes documented in this report are preliminary and upon coder review may  be revised to meet current compliance requirements. Katrinka Blazing, MD Katrinka Blazing,  08/29/2020 10:43:09 AM This report has been signed electronically. Number of Addenda: 0

## 2020-08-29 NOTE — Anesthesia Postprocedure Evaluation (Signed)
Anesthesia Post Note  Patient: Kristine Roberts  Procedure(s) Performed: ESOPHAGOGASTRODUODENOSCOPY (EGD) WITH PROPOFOL (N/A )  Patient location during evaluation: PACU Anesthesia Type: General Level of consciousness: awake, oriented, awake and alert and patient cooperative Pain management: satisfactory to patient Vital Signs Assessment: post-procedure vital signs reviewed and stable Respiratory status: spontaneous breathing, respiratory function stable and nonlabored ventilation Cardiovascular status: stable Postop Assessment: no apparent nausea or vomiting Anesthetic complications: no   No complications documented.   Last Vitals:  Vitals:   08/29/20 0826  BP: 119/66  Pulse: (!) 44  Resp: 10  Temp: 36.4 C  SpO2: 100%    Last Pain:  Vitals:   08/29/20 1024  TempSrc:   PainSc: 4                  Zamiyah Resendes

## 2020-08-29 NOTE — Transfer of Care (Signed)
Immediate Anesthesia Transfer of Care Note  Patient: Kristine Roberts  Procedure(s) Performed: ESOPHAGOGASTRODUODENOSCOPY (EGD) WITH PROPOFOL (N/A )  Patient Location: PACU  Anesthesia Type:General  Level of Consciousness: awake, alert , oriented and patient cooperative  Airway & Oxygen Therapy: Patient Spontanous Breathing  Post-op Assessment: Report given to RN, Post -op Vital signs reviewed and stable and Patient moving all extremities X 4  Post vital signs: Reviewed and stable  Last Vitals:  Vitals Value Taken Time  BP    Temp    Pulse    Resp 15 08/29/20 1037  SpO2    Vitals shown include unvalidated device data.  Last Pain:  Vitals:   08/29/20 1024  TempSrc:   PainSc: 4       Patients Stated Pain Goal: 5 (08/29/20 0826)  Complications: No complications documented.

## 2020-09-04 ENCOUNTER — Encounter (HOSPITAL_COMMUNITY): Payer: Self-pay | Admitting: Gastroenterology

## 2020-09-04 ENCOUNTER — Ambulatory Visit (INDEPENDENT_AMBULATORY_CARE_PROVIDER_SITE_OTHER): Payer: Medicare Other | Admitting: Gastroenterology

## 2020-09-05 HISTORY — PX: HIP SURGERY: SHX245

## 2020-09-08 ENCOUNTER — Other Ambulatory Visit (INDEPENDENT_AMBULATORY_CARE_PROVIDER_SITE_OTHER): Payer: Self-pay | Admitting: *Deleted

## 2020-09-08 ENCOUNTER — Encounter (INDEPENDENT_AMBULATORY_CARE_PROVIDER_SITE_OTHER): Payer: Self-pay | Admitting: *Deleted

## 2020-09-08 ENCOUNTER — Other Ambulatory Visit (INDEPENDENT_AMBULATORY_CARE_PROVIDER_SITE_OTHER): Payer: Self-pay | Admitting: Gastroenterology

## 2020-09-23 IMAGING — DX DG HIP (WITH OR WITHOUT PELVIS) 2-3V*L*
3 series · 3 of 3 positions shown · non-contrast
Comparison: CT of the abdomen pelvis 08/20/2019

CLINICAL DATA: Fall last night.  Left hip deformity and pain.

EXAM:
DG HIP (WITH OR WITHOUT PELVIS) 2-3V LEFT

[pelvis ap]
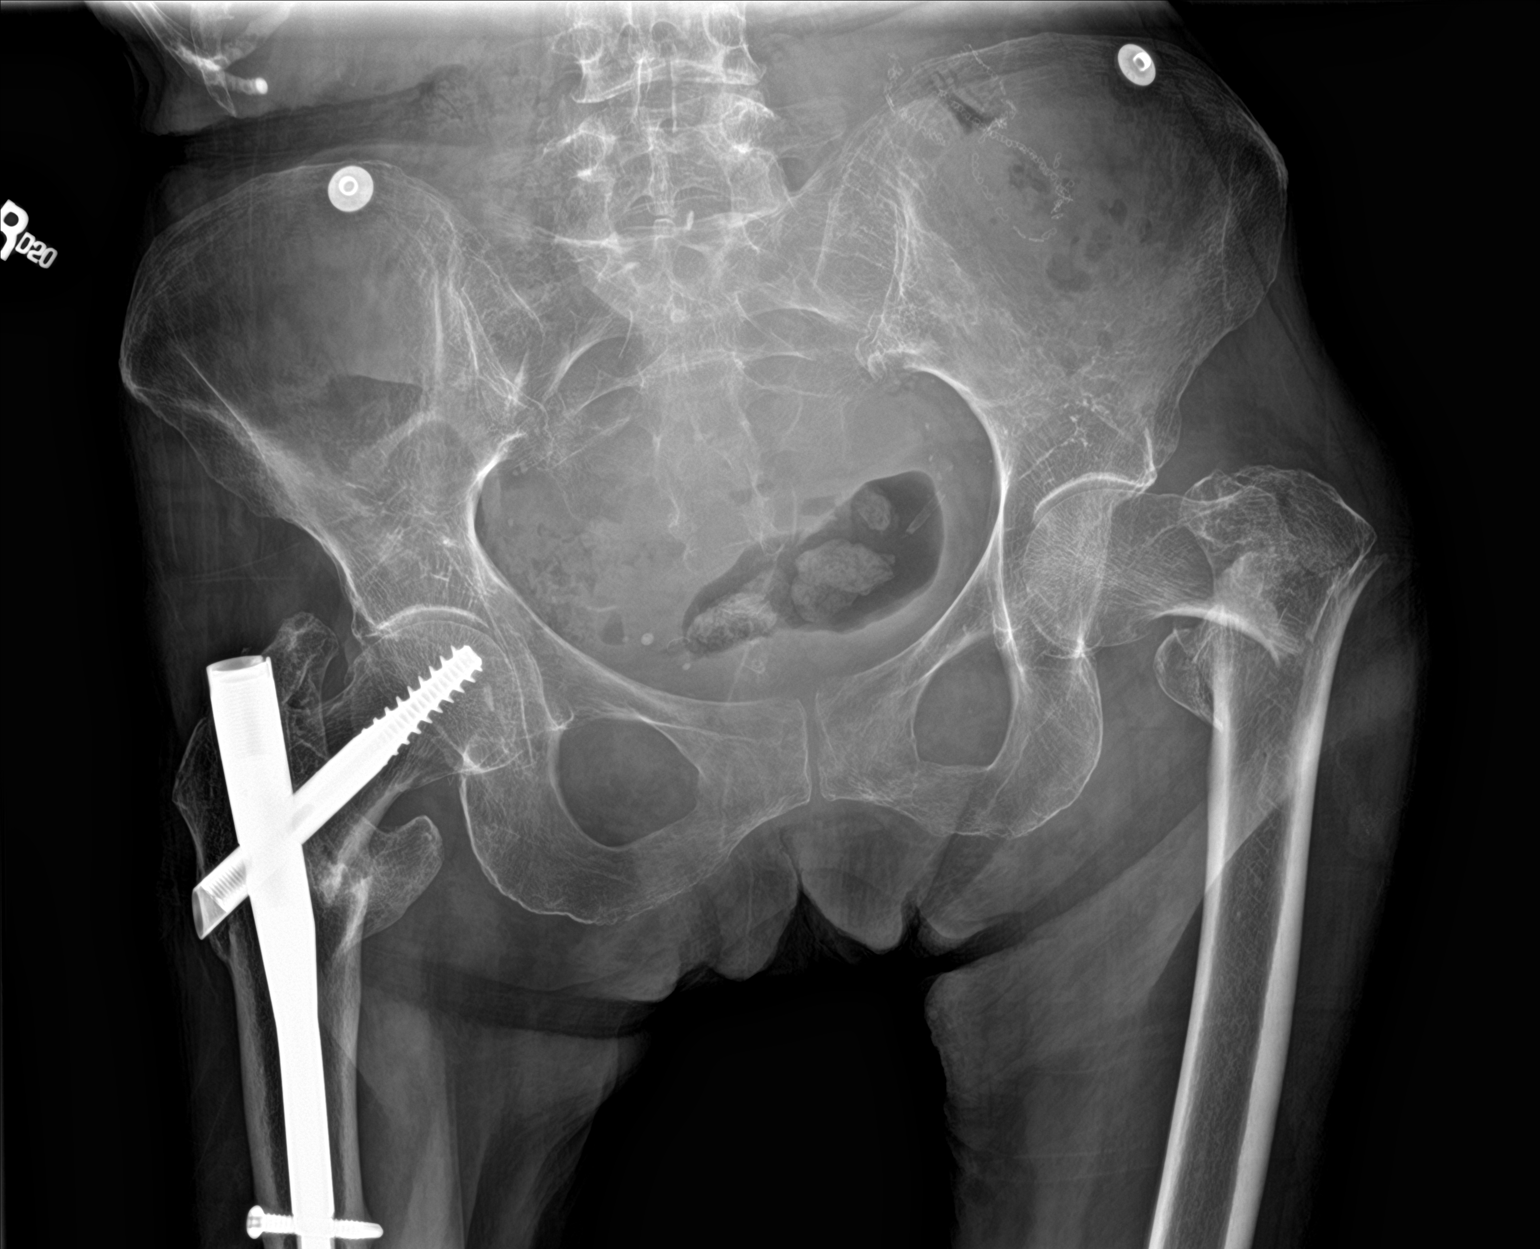

[hip ap]
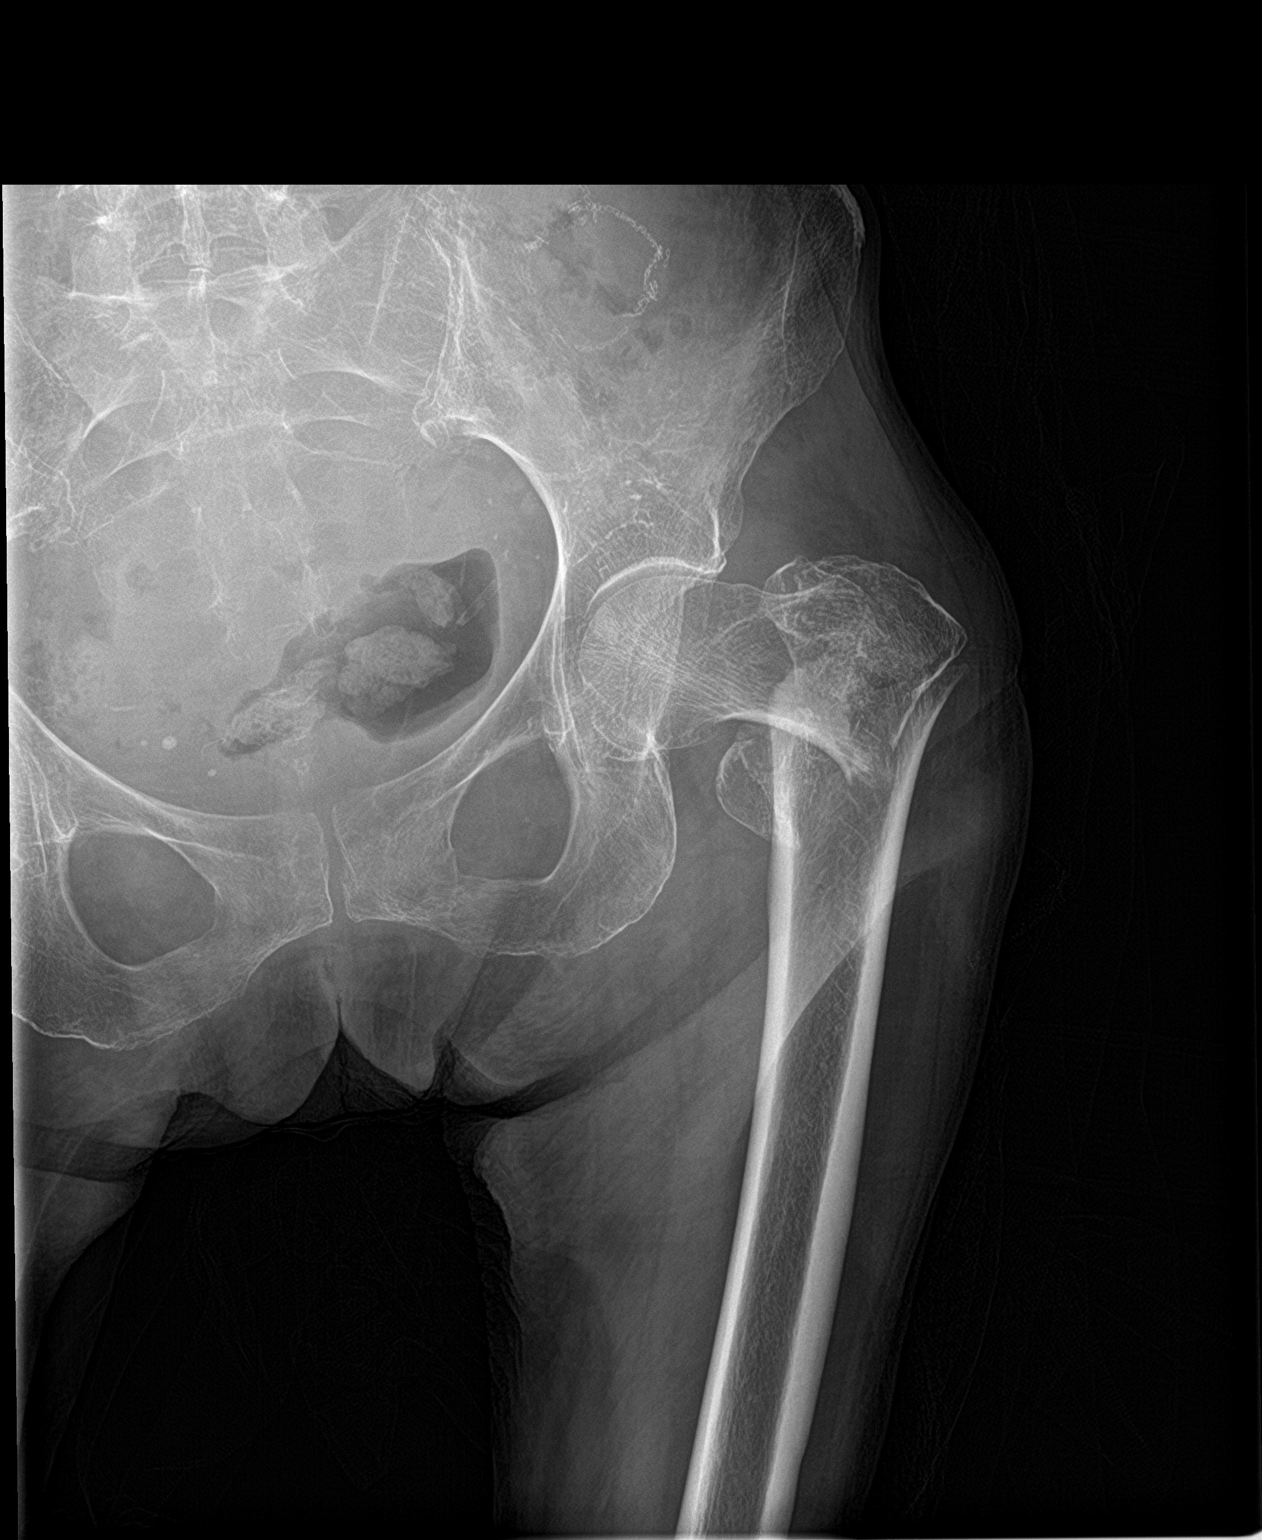

[hip x-table]
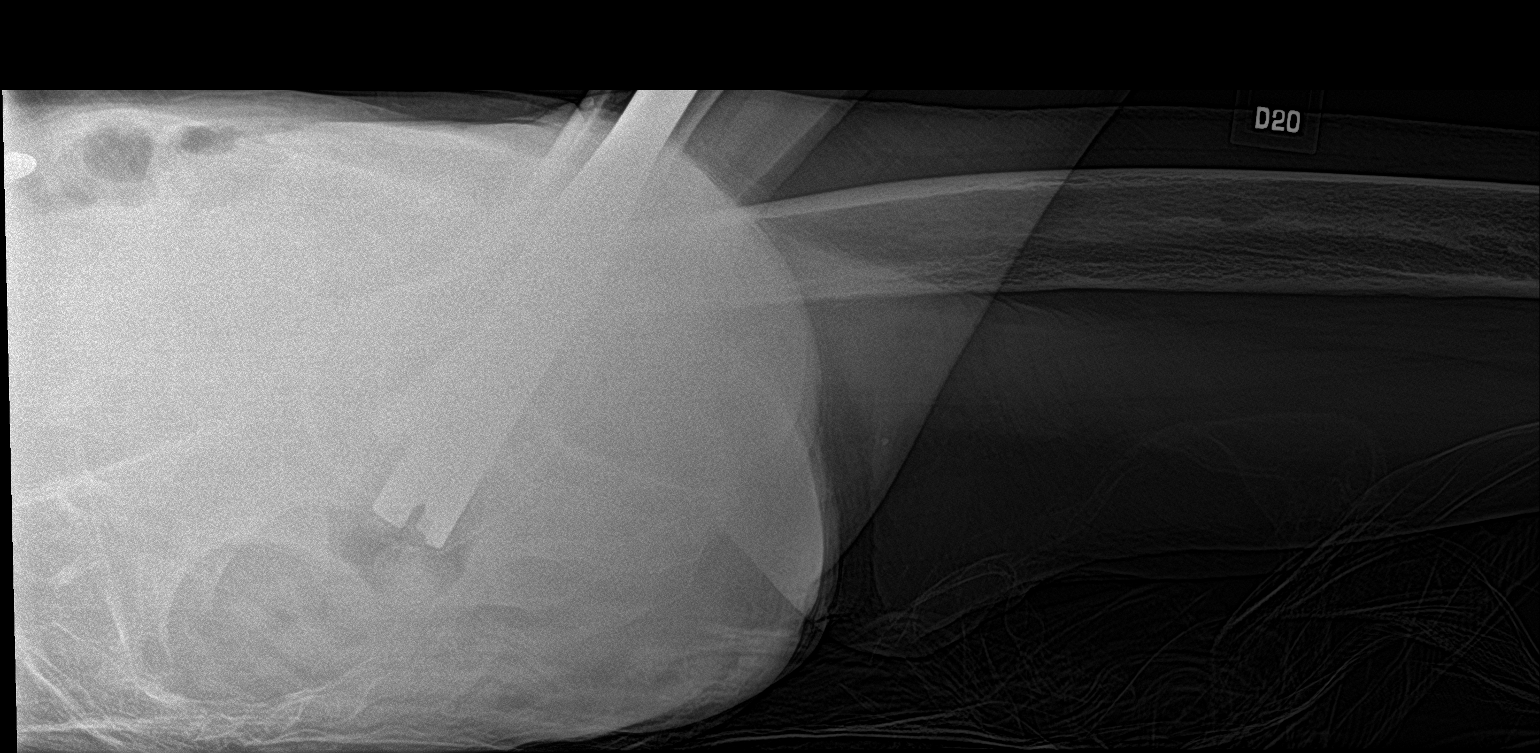

[3 of 3 positions shown; findings below may reference images not displayed]

FINDINGS: Comminuted intertrochanteric fracture is present in the left hip.
Femoroacetabular joint is intact. Pelvis is unremarkable. Osteopenia
is noted. Right hip ORIF is again noted.
IMPRESSION: Comminuted intertrochanteric fracture of the left hip.

## 2020-09-29 NOTE — Patient Instructions (Signed)
Kristine Roberts  09/29/2020     @PREFPERIOPPHARMACY @   Your procedure is scheduled on  10/03/2020.  Report to [facility] at  1230  P.M.  Call this number if you have problems the morning of surgery:  [phone]   Remember:  Follow the diet instructions given to you by the office.                      Take these medicines the morning of surgery with A SIP OF WATER  Hydrocodone(if needed).    Do not wear jewelry, make-up or nail polish.  Do not wear lotions, powders, or perfume. Please wear deodorant and brush your teeth.  Do not shave 48 hours prior to surgery.  Men may shave face and neck.  Do not bring valuables to the hospital.  [facility] is not responsible for any belongings or valuables.  Contacts, dentures or bridgework may not be worn into surgery.  Leave your suitcase in the car.  After surgery it may be brought to your room.  For patients admitted to the hospital, discharge time will be determined by your treatment team.  Patients discharged the day of surgery will not be allowed to drive home.   Name and phone number of your driver:   family Special instructions:  DO NOT smoke the morning of your procedure.  Please read over the following fact sheets that you were given. Anesthesia Post-op Instructions and Care and Recovery After Surgery       Upper Endoscopy, Adult, Care After This sheet gives you information about how to care for yourself after your procedure. Your health care provider may also give you more specific instructions. If you have problems or questions, contact your health care provider. What can I expect after the procedure? After the procedure, it is common to have:  A sore throat.  Mild stomach pain or discomfort.  Bloating.  Nausea. Follow these instructions at home:   Follow instructions from your health care provider about what to eat or drink after your procedure.  Return to your normal activities as told by your  health care provider. Ask your health care provider what activities are safe for you.  Take over-the-counter and prescription medicines only as told by your health care provider.  Do not drive for 24 hours if you were given a sedative during your procedure.  Keep all follow-up visits as told by your health care provider. This is important. Contact a health care provider if you have:  A sore throat that lasts longer than one day.  Trouble swallowing. Get help right away if:  You vomit blood or your vomit looks like coffee grounds.  You have: ? A fever. ? Bloody, black, or tarry stools. ? A severe sore throat or you cannot swallow. ? Difficulty breathing. ? Severe pain in your chest or abdomen. Summary  After the procedure, it is common to have a sore throat, mild stomach discomfort, bloating, and nausea.  Do not drive for 24 hours if you were given a sedative during the procedure.  Follow instructions from your health care provider about what to eat or drink after your procedure.  Return to your normal activities as told by your health care provider. This information is not intended to replace advice given to you by your health care provider. Make sure you discuss any questions you have with your health care provider. Document Revised: 05/16/2018 Document  Reviewed: 04/24/2018 Elsevier Patient Education  [place].  Esophageal Dilatation Esophageal dilatation, also called esophageal dilation, is a procedure to widen or open (dilate) a blocked or narrowed part of the esophagus. The esophagus is the part of the body that moves food and liquid from the mouth to the stomach. You may need this procedure if:  You have a buildup of scar tissue in your esophagus that makes it difficult, painful, or impossible to swallow. This can be caused by gastroesophageal reflux disease (GERD).  You have cancer of the esophagus.  There is a problem with how food moves through your  esophagus. In some cases, you may need this procedure repeated at a later time to dilate the esophagus gradually. Tell a health care provider about:  Any allergies you have.  All medicines you are taking, including vitamins, herbs, eye drops, creams, and over-the-counter medicines.  Any problems you or family members have had with anesthetic medicines.  Any blood disorders you have.  Any surgeries you have had.  Any medical conditions you have.  Any antibiotic medicines you are required to take before dental procedures.  Whether you are pregnant or may be pregnant. What are the risks? Generally, this is a safe procedure. However, problems may occur, including:  Bleeding due to a tear in the lining of the esophagus.  A hole (perforation) in the esophagus. What happens before the procedure?  Follow instructions from your health care provider about eating or drinking restrictions.  Ask your health care provider about changing or stopping your regular medicines. This is especially important if you are taking diabetes medicines or blood thinners.  Plan to have someone take you home from the hospital or clinic.  Plan to have a responsible adult care for you for at least 24 hours after you leave the hospital or clinic. This is important. What happens during the procedure?  You may be given a medicine to help you relax (sedative).  A numbing medicine may be sprayed into the back of your throat, or you may gargle the medicine.  Your health care provider may perform the dilatation using various surgical instruments, such as: ? Simple dilators. This instrument is carefully placed in the esophagus to stretch it. ? Guided wire bougies. This involves using an endoscope to insert a wire into the esophagus. A dilator is passed over this wire to enlarge the esophagus. Then the wire is removed. ? Balloon dilators. An endoscope with a small balloon at the end is inserted into the esophagus.  The balloon is inflated to stretch the esophagus and open it up. The procedure may vary among health care providers and hospitals. What happens after the procedure?  Your blood pressure, heart rate, breathing rate, and blood oxygen level will be monitored until the medicines you were given have worn off.  Your throat may feel slightly sore and numb. This will improve slowly over time.  You will not be allowed to eat or drink until your throat is no longer numb.  When you are able to drink, urinate, and sit on the edge of the bed without nausea or dizziness, you may be able to return home. Follow these instructions at home:  Take over-the-counter and prescription medicines only as told by your health care provider.  Do not drive for 24 hours if you were given a sedative during your procedure.  You should have a responsible adult with you for 24 hours after the procedure.  Follow instructions from your health  care provider about any eating or drinking restrictions.  Do not use any products that contain nicotine or tobacco, such as cigarettes and e-cigarettes. If you need help quitting, ask your health care provider.  Keep all follow-up visits as told by your health care provider. This is important. Get help right away if you:  Have a fever.  Have chest pain.  Have pain that is not relieved by medication.  Have trouble breathing.  Have trouble swallowing.  Vomit blood. Summary  Esophageal dilatation, also called esophageal dilation, is a procedure to widen or open (dilate) a blocked or narrowed part of the esophagus.  Plan to have someone take you home from the hospital or clinic.  For this procedure, a numbing medicine may be sprayed into the back of your throat, or you may gargle the medicine.  Do not drive for 24 hours if you were given a sedative during your procedure. This information is not intended to replace advice given to you by your health care provider. Make  sure you discuss any questions you have with your health care provider. Document Revised: 09/19/2019 Document Reviewed: 09/27/2017 Elsevier Patient Education  2020 [place] After These instructions provide you with information about caring for yourself after your procedure. Your health care provider may also give you more specific instructions. Your treatment has been planned according to current medical practices, but problems sometimes occur. Call your health care provider if you have any problems or questions after your procedure. What can I expect after the procedure? After your procedure, you may:  Feel sleepy for several hours.  Feel clumsy and have poor balance for several hours.  Feel forgetful about what happened after the procedure.  Have poor judgment for several hours.  Feel nauseous or vomit.  Have a sore throat if you had a breathing tube during the procedure. Follow these instructions at home: For at least 24 hours after the procedure:      Have a responsible adult stay with you. It is important to have someone help care for you until you are awake and alert.  Rest as needed.  Do not: ? Participate in activities in which you could fall or become injured. ? Drive. ? Use heavy machinery. ? Drink alcohol. ? Take sleeping pills or medicines that cause drowsiness. ? Make important decisions or sign legal documents. ? Take care of children on your own. Eating and drinking  Follow the diet that is recommended by your health care provider.  If you vomit, drink water, juice, or soup when you can drink without vomiting.  Make sure you have little or no nausea before eating solid foods. General instructions  Take over-the-counter and prescription medicines only as told by your health care provider.  If you have sleep apnea, surgery and certain medicines can increase your risk for breathing problems. Follow instructions from  your health care provider about wearing your sleep device: ? Anytime you are sleeping, including during daytime naps. ? While taking prescription pain medicines, sleeping medicines, or medicines that make you drowsy.  If you smoke, do not smoke without supervision.  Keep all follow-up visits as told by your health care provider. This is important. Contact a health care provider if:  You keep feeling nauseous or you keep vomiting.  You feel light-headed.  You develop a rash.  You have a fever. Get help right away if:  You have trouble breathing. Summary  For several hours after your procedure, you  may feel sleepy and have poor judgment.  Have a responsible adult stay with you for at least 24 hours or until you are awake and alert. This information is not intended to replace advice given to you by your health care provider. Make sure you discuss any questions you have with your health care provider. Document Revised: 02/20/2018 Document Reviewed: 03/14/2016 Elsevier Patient Education  [place].

## 2020-09-30 ENCOUNTER — Encounter (HOSPITAL_COMMUNITY): Payer: Self-pay

## 2020-10-01 ENCOUNTER — Encounter (HOSPITAL_COMMUNITY)
Admission: RE | Admit: 2020-10-01 | Discharge: 2020-10-01 | Disposition: A | Discharge status: Home / Self Care | Payer: Medicare Other | Source: Ambulatory Visit | Attending: Gastroenterology | Admitting: Gastroenterology

## 2020-10-01 ENCOUNTER — Other Ambulatory Visit (HOSPITAL_COMMUNITY)
Admission: RE | Admit: 2020-10-01 | Discharge: 2020-10-01 | Disposition: A | Discharge status: Home / Self Care | Payer: Medicare Other | Source: Ambulatory Visit | Attending: Gastroenterology | Admitting: Gastroenterology

## 2020-10-01 ENCOUNTER — Other Ambulatory Visit: Payer: Self-pay

## 2020-10-01 DIAGNOSIS — Z20822 Contact with and (suspected) exposure to covid-19: Secondary | ICD-10-CM | POA: Diagnosis not present

## 2020-10-01 DIAGNOSIS — Z01812 Encounter for preprocedural laboratory examination: Secondary | ICD-10-CM | POA: Diagnosis present

## 2020-10-01 HISTORY — DX: Cardiac arrhythmia, unspecified: I49.9

## 2020-10-01 HISTORY — DX: Age-related osteoporosis without current pathological fracture: M81.0

## 2020-10-02 LAB — SARS CORONAVIRUS 2 (TAT 6-24 HRS): SARS Coronavirus 2: NEGATIVE

## 2020-10-03 ENCOUNTER — Ambulatory Visit (HOSPITAL_COMMUNITY): Payer: Medicare Other | Admitting: Anesthesiology

## 2020-10-03 ENCOUNTER — Ambulatory Visit (HOSPITAL_COMMUNITY)
Admission: RE | Admit: 2020-10-03 | Discharge: 2020-10-03 | Disposition: A | Discharge status: Home / Self Care | Payer: Medicare Other | Source: Ambulatory Visit | Attending: Gastroenterology | Admitting: Gastroenterology

## 2020-10-03 ENCOUNTER — Encounter (HOSPITAL_COMMUNITY)
Admission: RE | Disposition: A | Discharge status: Home / Self Care | Payer: Self-pay | Source: Ambulatory Visit | Attending: Gastroenterology

## 2020-10-03 ENCOUNTER — Other Ambulatory Visit (INDEPENDENT_AMBULATORY_CARE_PROVIDER_SITE_OTHER): Payer: Self-pay | Admitting: Gastroenterology

## 2020-10-03 DIAGNOSIS — Z8711 Personal history of peptic ulcer disease: Secondary | ICD-10-CM | POA: Diagnosis not present

## 2020-10-03 DIAGNOSIS — K219 Gastro-esophageal reflux disease without esophagitis: Secondary | ICD-10-CM | POA: Diagnosis not present

## 2020-10-03 DIAGNOSIS — K3189 Other diseases of stomach and duodenum: Secondary | ICD-10-CM | POA: Diagnosis not present

## 2020-10-03 DIAGNOSIS — Z7982 Long term (current) use of aspirin: Secondary | ICD-10-CM | POA: Insufficient documentation

## 2020-10-03 DIAGNOSIS — Z7951 Long term (current) use of inhaled steroids: Secondary | ICD-10-CM | POA: Insufficient documentation

## 2020-10-03 DIAGNOSIS — Z803 Family history of malignant neoplasm of breast: Secondary | ICD-10-CM | POA: Diagnosis not present

## 2020-10-03 DIAGNOSIS — Z820 Family history of epilepsy and other diseases of the nervous system: Secondary | ICD-10-CM | POA: Insufficient documentation

## 2020-10-03 DIAGNOSIS — Z980 Intestinal bypass and anastomosis status: Secondary | ICD-10-CM | POA: Diagnosis not present

## 2020-10-03 DIAGNOSIS — X58XXXA Exposure to other specified factors, initial encounter: Secondary | ICD-10-CM | POA: Diagnosis not present

## 2020-10-03 DIAGNOSIS — K929 Disease of digestive system, unspecified: Secondary | ICD-10-CM

## 2020-10-03 DIAGNOSIS — K279 Peptic ulcer, site unspecified, unspecified as acute or chronic, without hemorrhage or perforation: Secondary | ICD-10-CM | POA: Diagnosis not present

## 2020-10-03 DIAGNOSIS — Z9049 Acquired absence of other specified parts of digestive tract: Secondary | ICD-10-CM | POA: Diagnosis not present

## 2020-10-03 DIAGNOSIS — T8189XA Other complications of procedures, not elsewhere classified, initial encounter: Secondary | ICD-10-CM | POA: Diagnosis not present

## 2020-10-03 DIAGNOSIS — Z800 Family history of malignant neoplasm of digestive organs: Secondary | ICD-10-CM | POA: Diagnosis not present

## 2020-10-03 DIAGNOSIS — F17210 Nicotine dependence, cigarettes, uncomplicated: Secondary | ICD-10-CM | POA: Insufficient documentation

## 2020-10-03 DIAGNOSIS — K9189 Other postprocedural complications and disorders of digestive system: Secondary | ICD-10-CM

## 2020-10-03 DIAGNOSIS — J449 Chronic obstructive pulmonary disease, unspecified: Secondary | ICD-10-CM | POA: Insufficient documentation

## 2020-10-03 DIAGNOSIS — K311 Adult hypertrophic pyloric stenosis: Secondary | ICD-10-CM | POA: Insufficient documentation

## 2020-10-03 DIAGNOSIS — Z825 Family history of asthma and other chronic lower respiratory diseases: Secondary | ICD-10-CM | POA: Insufficient documentation

## 2020-10-03 DIAGNOSIS — K2100 Gastro-esophageal reflux disease with esophagitis, without bleeding: Secondary | ICD-10-CM

## 2020-10-03 DIAGNOSIS — Z880 Allergy status to penicillin: Secondary | ICD-10-CM | POA: Insufficient documentation

## 2020-10-03 DIAGNOSIS — Z79899 Other long term (current) drug therapy: Secondary | ICD-10-CM | POA: Insufficient documentation

## 2020-10-03 DIAGNOSIS — K449 Diaphragmatic hernia without obstruction or gangrene: Secondary | ICD-10-CM | POA: Insufficient documentation

## 2020-10-03 HISTORY — PX: BALLOON DILATION: SHX5330

## 2020-10-03 HISTORY — PX: ESOPHAGOGASTRODUODENOSCOPY (EGD) WITH PROPOFOL: SHX5813

## 2020-10-03 SURGERY — ESOPHAGOGASTRODUODENOSCOPY (EGD) WITH PROPOFOL
Anesthesia: General

## 2020-10-03 MED ORDER — PROPOFOL 500 MG/50ML IV EMUL
INTRAVENOUS | Status: DC | PRN
  Administered 2020-10-03: 100 ug/kg/min via INTRAVENOUS

## 2020-10-03 MED ORDER — LACTATED RINGERS IV SOLN: INTRAVENOUS | Status: DC

## 2020-10-03 MED ORDER — FENTANYL CITRATE (PF) 100 MCG/2ML IJ SOLN
INTRAMUSCULAR | Status: AC
  Filled 2020-10-03: qty 2

## 2020-10-03 MED ORDER — LACTATED RINGERS IV SOLN: INTRAVENOUS | Status: DC | PRN

## 2020-10-03 MED ORDER — PROPOFOL 500 MG/50ML IV EMUL
INTRAVENOUS | Status: DC | PRN
  Administered 2020-10-03: 50 mg via INTRAVENOUS

## 2020-10-03 MED ORDER — FENTANYL CITRATE (PF) 100 MCG/2ML IJ SOLN
50.0000 ug | Freq: Once | INTRAMUSCULAR | Status: AC
  Administered 2020-10-03: 50 ug via INTRAVENOUS

## 2020-10-03 NOTE — Anesthesia Preprocedure Evaluation (Signed)
Anesthesia Evaluation  Patient identified by MRN, date of birth, ID band Patient awake    Reviewed: Allergy & Precautions, NPO status , Patient's Chart, lab work & pertinent test results, reviewed documented beta blocker date and time   Airway Mallampati: II  TM Distance: >3 FB Neck ROM: Full    Dental no notable dental hx.    Pulmonary COPD,  COPD inhaler, Current Smoker,    Pulmonary exam normal breath sounds clear to auscultation       Cardiovascular negative cardio ROS Normal cardiovascular exam Rhythm:Regular Rate:Normal     Neuro/Psych    GI/Hepatic hiatal hernia, PUD, GERD  ,  Endo/Other  negative endocrine ROS  Renal/GU negative Renal ROS     Musculoskeletal   Abdominal   Peds  Hematology  (+) anemia ,   Anesthesia Other Findings   Reproductive/Obstetrics                             Anesthesia Physical Anesthesia Plan  ASA: II  Anesthesia Plan: General   Post-op Pain Management:    Induction: Intravenous  PONV Risk Score and Plan:   Airway Management Planned: Nasal Cannula  Additional Equipment:   Intra-op Plan:   Post-operative Plan:   Informed Consent: I have reviewed the patients History and Physical, chart, labs and discussed the procedure including the risks, benefits and alternatives for the proposed anesthesia with the patient or authorized representative who has indicated his/her understanding and acceptance.     Dental advisory given  Plan Discussed with: CRNA  Anesthesia Plan Comments:         Anesthesia Quick Evaluation

## 2020-10-03 NOTE — Discharge Instructions (Signed)
You are being discharged to home.  Resume your previous diet.  Continue your present medications, including Dexilant 30 mg BID.  Smoking cessation. Avoid Goody powders, BC powders or other NSAIDs. Your physician has recommended a repeat upper endoscopy in four weeks for surveillance.       Monitored Anesthesia Care, Care After These instructions provide you with information about caring for yourself after your procedure. Your health care provider may also give you more specific instructions. Your treatment has been planned according to current medical practices, but problems sometimes occur. Call your health care provider if you have any problems or questions after your procedure. What can I expect after the procedure? After your procedure, you may:  Feel sleepy for several hours.  Feel clumsy and have poor balance for several hours.  Feel forgetful about what happened after the procedure.  Have poor judgment for several hours.  Feel nauseous or vomit.  Have a sore throat if you had a breathing tube during the procedure. Follow these instructions at home: For at least 24 hours after the procedure:      Have a responsible adult stay with you. It is important to have someone help care for you until you are awake and alert.  Rest as needed.  Do not: ? Participate in activities in which you could fall or become injured. ? Drive. ? Use heavy machinery. ? Drink alcohol. ? Take sleeping pills or medicines that cause drowsiness. ? Make important decisions or sign legal documents. ? Take care of children on your own. Eating and drinking  Follow the diet that is recommended by your health care provider.  If you vomit, drink water, juice, or soup when you can drink without vomiting.  Make sure you have little or no nausea before eating solid foods. General instructions  Take over-the-counter and prescription medicines only as told by your health care provider.  If you  have sleep apnea, surgery and certain medicines can increase your risk for breathing problems. Follow instructions from your health care provider about wearing your sleep device: ? Anytime you are sleeping, including during daytime naps. ? While taking prescription pain medicines, sleeping medicines, or medicines that make you drowsy.  If you smoke, do not smoke without supervision.  Keep all follow-up visits as told by your health care provider. This is important. Contact a health care provider if:  You keep feeling nauseous or you keep vomiting.  You feel light-headed.  You develop a rash.  You have a fever. Get help right away if:  You have trouble breathing. Summary  For several hours after your procedure, you may feel sleepy and have poor judgment.  Have a responsible adult stay with you for at least 24 hours or until you are awake and alert. This information is not intended to replace advice given to you by your health care provider. Make sure you discuss any questions you have with your health care provider. Document Revised: 02/20/2018 Document Reviewed: 03/14/2016 Elsevier Patient Education  2020 [employer] Upper Endoscopy, Adult Upper endoscopy is a procedure to look inside the upper GI (gastrointestinal) tract. The upper GI tract is made up of:  The part of the body that moves food from your mouth to your stomach (esophagus).  The stomach.  The first part of your small intestine (duodenum). This procedure is also called esophagogastroduodenoscopy (EGD) or gastroscopy. In this procedure, your health care provider passes a thin, flexible tube (endoscope) through your mouth and down your esophagus  into your stomach. A small camera is attached to the end of the tube. Images from the camera appear on a monitor in the exam room. During this procedure, your health care provider may also remove a small piece of tissue to be sent to a lab and examined under a microscope  (biopsy). Your health care provider may do an upper endoscopy to diagnose cancers of the upper GI tract. You may also have this procedure to find the cause of other conditions, such as:  Stomach pain.  Heartburn.  Pain or problems when swallowing.  Nausea and vomiting.  Stomach bleeding.  Stomach ulcers. Tell a health care provider about:  Any allergies you have.  All medicines you are taking, including vitamins, herbs, eye drops, creams, and over-the-counter medicines.  Any problems you or family members have had with anesthetic medicines.  Any blood disorders you have.  Any surgeries you have had.  Any medical conditions you have.  Whether you are pregnant or may be pregnant. What are the risks? Generally, this is a safe procedure. However, problems may occur, including:  Infection.  Bleeding.  Allergic reactions to medicines.  A tear or hole (perforation) in the esophagus, stomach, or duodenum. What happens before the procedure? Staying hydrated Follow instructions from your health care provider about hydration, which may include:  Up to 2 hours before the procedure - you may continue to drink clear liquids, such as water, clear fruit juice, black coffee, and plain tea.  Eating and drinking restrictions Follow instructions from your health care provider about eating and drinking, which may include:  8 hours before the procedure - stop eating heavy meals or foods, such as meat, fried foods, or fatty foods.  6 hours before the procedure - stop eating light meals or foods, such as toast or cereal.  6 hours before the procedure - stop drinking milk or drinks that contain milk.  2 hours before the procedure - stop drinking clear liquids. Medicines Ask your health care provider about:  Changing or stopping your regular medicines. This is especially important if you are taking diabetes medicines or blood thinners.  Taking medicines such as aspirin and  ibuprofen. These medicines can thin your blood. Do not take these medicines unless your health care provider tells you to take them.  Taking over-the-counter medicines, vitamins, herbs, and supplements. General instructions  Plan to have someone take you home from the hospital or clinic.  If you will be going home right after the procedure, plan to have someone with you for 24 hours.  Ask your health care provider what steps will be taken to help prevent infection. What happens during the procedure?   An IV will be inserted into one of your veins.  You may be given one or more of the following: ? A medicine to help you relax (sedative). ? A medicine to numb the throat (local anesthetic).  You will lie on your left side on an exam table.  Your health care provider will pass the endoscope through your mouth and down your esophagus.  Your health care provider will use the scope to check the inside of your esophagus, stomach, and duodenum. Biopsies may be taken.  The endoscope will be removed. The procedure may vary among health care providers and hospitals. What happens after the procedure?  Your blood pressure, heart rate, breathing rate, and blood oxygen level will be monitored until you leave the hospital or clinic.  Do not drive for 24 hours if  you were given a sedative during your procedure.  When your throat is no longer numb, you may be given some fluids to drink.  It is up to you to get the results of your procedure. Ask your health care provider, or the department that is doing the procedure, when your results will be ready. Summary  Upper endoscopy is a procedure to look inside the upper GI tract.  During the procedure, an IV will be inserted into one of your veins. You may be given a medicine to help you relax.  A medicine will be used to numb your throat.  The endoscope will be passed through your mouth and down your esophagus. This information is not intended to  replace advice given to you by your health care provider. Make sure you discuss any questions you have with your health care provider. Document Revised: 05/17/2018 Document Reviewed: 04/24/2018 Elsevier Patient Education  2020 [employer].

## 2020-10-03 NOTE — Op Note (Signed)
[facility] Patient Name: Kristine Roberts Procedure Date: 10/03/2020 2:17 PM MRN: [id number] Date of Birth: [birth year] Attending MD: Katrinka Blazing ,  CSN: [id number] Age: 64 Admit Type: Outpatient Procedure:                Upper GI endoscopy Indications:              Management of operative complication: Dilation of                            anastomotic stricture Providers:                Katrinka Blazing, Angelica Ran, Crystal Page, Cyril Mourning, Technician, Edythe Clarity, Technician Referring MD:              Medicines:                Monitored Anesthesia Care Complications:            No immediate complications. Estimated Blood Loss:     Estimated blood loss: none. Procedure:                Pre-Anesthesia Assessment:                           - Prior to the procedure, a History and Physical                            was performed, and patient medications, allergies                            and sensitivities were reviewed. The patient's                            tolerance of previous anesthesia was reviewed.                           - The risks and benefits of the procedure and the                            sedation options and risks were discussed with the                            patient. All questions were answered and informed                            consent was obtained.                           - ASA Grade Assessment: III - A patient with severe                            systemic disease.                           After obtaining informed consent, the endoscope was  passed under direct vision. Throughout the                            procedure, the patient's blood pressure, pulse, and                            oxygen saturations were monitored continuously. The                            GIF-H190 ([id number]) scope was introduced through the                            mouth, and advanced to the second  part of duodenum.                            The upper GI endoscopy was accomplished without                            difficulty. The patient tolerated the procedure                            well. Scope In: 2:27:04 PM Scope Out: 2:36:25 PM Total Procedure Duration: 0 hours 9 minutes 21 seconds  Findings:      LA Grade C (one or more mucosal breaks continuous between tops of 2 or       more mucosal folds, less than 75% circumference) esophagitis with no       bleeding was found in the lower third of the esophagus.      Evidence of a stenosed Billroth I gastroduodenostomy was found. A       gastric pouch with a normal size was found containing food debris. The       gastroduodenal anastomosis was characterized by congestion, edema and       stenosis. This was traversed with the upper scope with mild resistance.       A TTS dilator was passed through the scope. Dilation with a 09-16-11 mm       anastomotic balloon dilator was performed, with max size of 12 mm.       Mucosal disruption was noted.      The examined duodenum was normal. Impression:               - LA Grade C reflux esophagitis with no bleeding.                           - Stenosed Billroth I gastroduodenostomy was found,                            characterized by congestion, edema and stenosis.                            Dilated.                           - Normal examined duodenum.                           -  No specimens collected. Moderate Sedation:      Per Anesthesia Care Recommendation:           - Discharge patient to home (ambulatory).                           - Resume previous diet - try to eat smaller meals                            (5-6 times a day) and chew food thoroughly.                           - Continue present medications, including Dexilant                            30 mg BID.                           - Smoking cessation.                           - Avoid Goody powders, BC powders or other  NSAIDs.                           - Repeat upper endoscopy in 4 weeks for                            surveillance. Procedure Code(s):        --- Professional ---                           [phone], GC, Esophagogastroduodenoscopy, flexible,                            transoral; with dilation of gastric/duodenal                            stricture(s) (eg, balloon, bougie) Diagnosis Code(s):        --- Professional ---                           K21.00, Gastro-esophageal reflux disease with                            esophagitis, without bleeding                           Z98.0, Intestinal bypass and anastomosis status                           K91.89, Other postprocedural complications and                            disorders of digestive system CPT copyright 2019 American [employer] Association. All rights reserved. The codes documented in this report are preliminary and upon coder review may  be revised to meet current compliance requirements. Katrinka Blazing, MD Katrinka Blazing,  10/03/2020 2:51:34  PM This report has been signed electronically. Number of Addenda: 0

## 2020-10-03 NOTE — Transfer of Care (Signed)
Immediate Anesthesia Transfer of Care Note  Patient: Kristine Roberts  Procedure(s) Performed: ESOPHAGOGASTRODUODENOSCOPY (EGD) WITH PROPOFOL (N/A ) BALLOON DILATION  Patient Location: PACU  Anesthesia Type:General  Level of Consciousness: awake, alert , oriented and patient cooperative  Airway & Oxygen Therapy: Patient Spontanous Breathing  Post-op Assessment: Report given to RN, Post -op Vital signs reviewed and stable and Patient moving all extremities X 4  Post vital signs: Reviewed and stable  Last Vitals:  Vitals Value Taken Time  BP    Temp    Pulse    Resp    SpO2      Last Pain:  Vitals:   10/03/20 1421  TempSrc:   PainSc: 0-No pain      Patients Stated Pain Goal: 8 (10/03/20 1305)  Complications: No complications documented.

## 2020-10-03 NOTE — Anesthesia Postprocedure Evaluation (Signed)
Anesthesia Post Note  Patient: Kristine Roberts  Procedure(s) Performed: ESOPHAGOGASTRODUODENOSCOPY (EGD) WITH PROPOFOL (N/A ) BALLOON DILATION  Patient location during evaluation: PACU Anesthesia Type: General Level of consciousness: awake, oriented, awake and alert and patient cooperative Pain management: satisfactory to patient Vital Signs Assessment: post-procedure vital signs reviewed and stable Respiratory status: spontaneous breathing, respiratory function stable and nonlabored ventilation Cardiovascular status: stable Postop Assessment: no apparent nausea or vomiting Anesthetic complications: no   No complications documented.   Last Vitals:  Vitals:   10/03/20 1305 10/03/20 1442  BP: 127/60   Pulse: (!) 57   Resp: 16   Temp: 36.6 C (P) 36.4 C  SpO2: 99%     Last Pain:  Vitals:   10/03/20 1442  TempSrc:   PainSc: (P) 0-No pain                 Angeliz Settlemyre

## 2020-10-03 NOTE — H&P (Addendum)
Kristine Roberts is an 64 y.o. female.   Chief Complaint: Gastric outlet obstruction HPI: 64 y.o.femaleappendical history of COPD, GERD, peptic ulcer disease complicated by bleeding requiring partial gastrectomy with Billroth I reconstruction complicated by recurrent stricture at the anastomosis, coming to the hospital to undergo endoscopic evaluation and management of recurrent gastroduodenal stricture.  The patient underwent a repeat balloon dilation of her Billroth I anastomosis on 08/01/2020.  The anastomosis was dilated up to 10 mm and was injected with Kenalog 2 mL at the anastomotic site.  Since then, the patient has presented improvement in her vomiting episode but is still presenting diagnostically basis.  He has been gaining some weight, close to 5 pounds and is able to tolerate more food but still is presenting vomiting and early satiety.  Patient is still smoking, had goody powders last week.  Past Medical History:  Diagnosis Date  . COPD (chronic obstructive pulmonary disease) (HCC)   . Dysphagia   . Dysrhythmia   . Esophagitis   . Gastric outlet obstruction   . Gastric stenosis   . GERD (gastroesophageal reflux disease)   . Hiatal hernia   . Osteoporosis   . PUD (peptic ulcer disease)   . Wounds, multiple     Past Surgical History:  Procedure Laterality Date  . APPENDECTOMY    . BALLOON DILATION N/A 03/05/2016   Procedure: BALLOON DILATION;  Surgeon: Malissa Hippo, MD;  Location: [facility];  Service: Endoscopy;  Laterality: N/A;  pyloric channel dialtaion  . BALLOON DILATION N/A 07/14/2018   Procedure: BALLOON DILATION;  Surgeon: Lynann Bologna, MD;  Location: [facility] ENDOSCOPY;  Service: Endoscopy;  Laterality: N/A;  . BIOPSY  07/14/2018   Procedure: BIOPSY;  Surgeon: Lynann Bologna, MD;  Location: [facility] ENDOSCOPY;  Service: Endoscopy;;  . BIOPSY  07/03/2020   Procedure: BIOPSY;  Surgeon: Shellia Cleverly, DO;  Location: [facility] ENDOSCOPY;  Service: Gastroenterology;;  .  CHOLECYSTECTOMY    . COLONOSCOPY WITH PROPOFOL N/A 08/01/2020   Procedure: COLONOSCOPY WITH PROPOFOL;  Surgeon: Dolores Frame, MD;  Location: [facility];  Service: Gastroenterology;  Laterality: N/A;  1130  . ESOPHAGEAL DILATION N/A 03/03/2016   Procedure: ESOPHAGEAL DILATION;  Surgeon: Malissa Hippo, MD;  Location: [facility];  Service: Endoscopy;  Laterality: N/A;  . ESOPHAGEAL DILATION N/A 05/07/2016   Procedure: ESOPHAGEAL DILATION;  Surgeon: Malissa Hippo, MD;  Location: [facility];  Service: Endoscopy;  Laterality: N/A;  . ESOPHAGEAL DILATION  01/26/2018   Procedure: DILATION OF ANASTOMOTIC  STRICTURE;  Surgeon: Malissa Hippo, MD;  Location: [facility];  Service: Endoscopy;;  . ESOPHAGEAL DILATION N/A 08/01/2020   Procedure: ESOPHAGEAL DILATION;  Surgeon: Dolores Frame, MD;  [facility];  Service: Gastroenterology;  Laterality: N/A;  . ESOPHAGOGASTRODUODENOSCOPY N/A 03/03/2016   Procedure: ESOPHAGOGASTRODUODENOSCOPY (EGD);  Surgeon: Malissa Hippo, MD;  [facility];  Service: Endoscopy;  Laterality: N/A;  2:00  . ESOPHAGOGASTRODUODENOSCOPY N/A 05/07/2016   Procedure: ESOPHAGOGASTRODUODENOSCOPY (EGD);  Surgeon: Malissa Hippo, MD;  [facility];  Service: Endoscopy;  Laterality: N/A;  855 - moved to 6/2 @ 10:15 - Ann notified pt  . ESOPHAGOGASTRODUODENOSCOPY N/A 12/28/2017   Procedure: ESOPHAGOGASTRODUODENOSCOPY (EGD) with stricture dilation;  Surgeon: Malissa Hippo, MD;  [facility];  Service: Endoscopy;  Laterality: N/A;  . ESOPHAGOGASTRODUODENOSCOPY N/A 01/26/2018   Procedure: ESOPHAGOGASTRODUODENOSCOPY (EGD);  Surgeon: Malissa Hippo, MD;  [facility];  Service: Endoscopy;  Laterality: N/A;  255  . ESOPHAGOGASTRODUODENOSCOPY N/A 07/14/2018   Procedure: ESOPHAGOGASTRODUODENOSCOPY (EGD);  Surgeon: Lynann Bologna, MD;  Location: [facility] ENDOSCOPY;  Service: Endoscopy;  Laterality: N/A;  .  ESOPHAGOGASTRODUODENOSCOPY (EGD) WITH PROPOFOL N/A 03/05/2016   Procedure: ESOPHAGOGASTRODUODENOSCOPY (EGD) WITH PROPOFOL Anastomotic stricture dilation ;  Surgeon: Malissa Hippo, MD;  [facility];  Service: Endoscopy;  Laterality: N/A;  to be done in OR under fluoro  . ESOPHAGOGASTRODUODENOSCOPY (EGD) WITH PROPOFOL N/A 08/22/2019   Procedure: ESOPHAGOGASTRODUODENOSCOPY (EGD) WITH PROPOFOL With anastomotic stricture dilation.;  Surgeon: Malissa Hippo, MD;  [facility];  Service: Endoscopy;  Laterality: N/A;  . ESOPHAGOGASTRODUODENOSCOPY (EGD) WITH PROPOFOL N/A 08/24/2019   Procedure: ESOPHAGOGASTRODUODENOSCOPY (EGD) WITH PROPOFOL with stricture dilation;  Surgeon: Malissa Hippo, MD;  [facility];  Service: Endoscopy;  Laterality: N/A;  . ESOPHAGOGASTRODUODENOSCOPY (EGD) WITH PROPOFOL N/A 07/03/2020   Procedure: ESOPHAGOGASTRODUODENOSCOPY (EGD) WITH PROPOFOL;  Surgeon: Shellia Cleverly, DO;  Location: [facility] ENDOSCOPY;  Service: Gastroenterology;  Laterality: N/A;  . ESOPHAGOGASTRODUODENOSCOPY (EGD) WITH PROPOFOL N/A 08/01/2020   Procedure: ESOPHAGOGASTRODUODENOSCOPY (EGD) WITH PROPOFOL;  Surgeon: Dolores Frame, MD;  [facility];  Service: Gastroenterology;  Laterality: N/A;  . ESOPHAGOGASTRODUODENOSCOPY (EGD) WITH PROPOFOL N/A 08/29/2020   Procedure: ESOPHAGOGASTRODUODENOSCOPY (EGD) WITH PROPOFOL;  Surgeon: Dolores Frame, MD;  Location: [facility];  Service: Gastroenterology;  Laterality: N/A;  10:00  . FOREIGN BODY REMOVAL  07/03/2020   Procedure: FOREIGN BODY REMOVAL;  Surgeon: Shellia Cleverly, DO;  Location: [facility] ENDOSCOPY;  Service: Gastroenterology;;  . HIP SURGERY Right 09/2020  . HIP SURGERY Left 07/2020  . INTRAMEDULLARY (IM) NAIL INTERTROCHANTERIC Right 07/09/2018   Procedure: INTRAMEDULLARY (IM) NAIL INTERTROCHANTRIC;  Surgeon: Yolonda Kida, MD;  Location[facility] OR;  Service: Orthopedics;  Laterality: Right;  .  INTRAMEDULLARY (IM) NAIL INTERTROCHANTERIC Left 06/27/2020   Procedure: INTRAMEDULLARY (IM) NAIL INTERTROCHANTRIC;  Surgeon: Yolonda Kida, MD;  Location: [facility] OR;  Service: Orthopedics;  Laterality: Left;  . KENALOG INJECTION  07/03/2020   Procedure: KENALOG INJECTION;  Surgeon: Shellia Cleverly, DO;  Location: [facility] ENDOSCOPY;  Service: Gastroenterology;;  . STOMACH SURGERY    . TOTAL HIP ARTHROPLASTY Bilateral     Family History  Problem Relation Age of Onset  . Alzheimer's disease Mother   . COPD Mother   . Throat cancer Father   . Breast cancer Sister    Social History:  reports that she has been smoking cigarettes. She has a 30.00 pack-year smoking history. She has never used smokeless tobacco. She reports previous alcohol use of about 12.0 standard drinks of alcohol per week. She reports that she does not use drugs.  Allergies:  Allergies  Allergen Reactions  . Penicillins Itching, Swelling and Other (See Comments)    Tongue swells Has patient had a PCN reaction causing immediate rash, facial/tongue/throat swelling, SOB or lightheadedness with hypotension: Yes, had a rash and tongue swelled up. No SOB and lightheadedness. Has patient had a PCN reaction causing severe rash involving mucus membranes or skin necrosis: No Has patient had a PCN reaction that required hospitalization: No Has patient had a PCN reaction occurring within the last 10 years: No If all of the above answers are "NO", then may procee    Medications Prior to Admission  Medication Sig Dispense Refill  . aspirin EC 325 MG EC tablet Take 1 tablet (325 mg total) by mouth daily with breakfast. (Patient taking differently: Take 650 mg by mouth daily  as needed for pain. ) 30 tablet 0  . Aspirin-Acetaminophen-Caffeine (GOODY HEADACHE PO) Take 2 packets by mouth daily as needed (pain).    . baclofen (LIORESAL) 10 MG tablet Take 10 mg by mouth at bedtime.    . Cyanocobalamin (B-12 COMPLIANCE INJECTION IJ) Inject 1  Dose as directed every 30 (thirty) days.    Marland Kitchen EPINEPHrine (PRIMATENE MIST) 0.125 MG/ACT AERO Inhale 1-2 puffs into the lungs daily as needed (shortness of breath).    . famotidine (PEPCID) 20 MG tablet Take 20 mg by mouth daily as needed for heartburn or indigestion.    Marland Kitchen HYDROcodone-acetaminophen (NORCO/VICODIN) 5-325 MG tablet Take 1 tablet by mouth every 6 (six) hours as needed (hip pain.).     Marland Kitchen PLENVU 140 g SOLR Take 140 g by mouth once.    . Potassium 99 MG TABS Take 99 mg by mouth daily.    . Tetrahydrozoline HCl (VISINE OP) Place 1 drop into both eyes every other day.     . traZODone (DESYREL) 100 MG tablet Take 200 mg by mouth at bedtime.     . lansoprazole (PREVACID SOLUTAB) 30 MG disintegrating tablet Take 1 tablet (30 mg total) by mouth 2 (two) times daily. (Patient not taking: Reported on 08/21/2020) 60 tablet 2  . mometasone-formoterol (DULERA) 100-5 MCG/ACT AERO Inhale 2 puffs into the lungs 2 (two) times daily. (Patient not taking: Reported on 08/21/2020) 13 g 2  . Multiple Vitamin (MULTIVITAMIN WITH MINERALS) TABS tablet Take 1 tablet by mouth daily. Multivitamin for Women 50+    . sucralfate (CARAFATE) 1 g tablet Take 1 tablet (1 g total) by mouth 2 (two) times daily. (Patient not taking: Reported on 09/30/2020) 60 tablet 3    Results for orders placed or performed during the hospital encounter of 10/01/20 (from the past 48 hour(s))  SARS CORONAVIRUS 2 (TAT 6-24 HRS) Nasopharyngeal Nasopharyngeal Swab     Status: None   Collection Time: 10/01/20  2:52 PM   Specimen: Nasopharyngeal Swab  Result Value Ref Range   SARS Coronavirus 2 NEGATIVE NEGATIVE    Comment: (NOTE) SARS-CoV-2 target nucleic acids are NOT DETECTED.  The SARS-CoV-2 RNA is generally detectable in upper and lower respiratory specimens during the acute phase of infection. Negative results do not preclude SARS-CoV-2 infection, do not rule out co-infections with other pathogens, and should not be used as the sole  basis for treatment or other patient management decisions. Negative results must be combined with clinical observations, patient history, and epidemiological information. The expected result is Negative.  Fact Sheet for Patients: HairSlick.no  Fact Sheet for Healthcare Providers: quierodirigir.com  This test is not yet approved or cleared by the [place] FDA and  has been authorized for detection and/or diagnosis of SARS-CoV-2 by FDA under an Emergency Use Authorization (EUA). This EUA will remain  in effect (meaning this test can be used) for the duration of the COVID-19 declaration under Se ction 564(b)(1) of the Act, 21 U.S.C. section 360bbb-3(b)(1), unless the authorization is terminated or revoked sooner.  Performed at [facility], [address] [place], [place] [address]    No results found.  Review of Systems  Constitutional: Negative.   HENT: Negative.   Eyes: Negative.   Respiratory: Negative.   Cardiovascular: Negative.   Gastrointestinal: Positive for abdominal distention, nausea and vomiting.  Endocrine: Negative.   Genitourinary: Negative.   Musculoskeletal: Negative.   Skin: Negative.   Allergic/Immunologic: Negative.   Neurological: Negative.   Hematological:  Negative.   Psychiatric/Behavioral: Negative.     Blood pressure 127/60, pulse (!) 57, temperature 97.9 F (36.6 C), temperature source Oral, resp. rate 16, SpO2 99 %. Physical Exam  GENERAL: The patient is AO x3, in no acute distress. Elder and underweight. HEENT: Head is normocephalic and atraumatic. EOMI are intact. Mouth is well hydrated and without lesions. NECK: Supple. No masses LUNGS: Clear to auscultation. No presence of rhonchi/wheezing/rales. Adequate chest expansion HEART: RRR, normal s1 and s2. ABDOMEN: Soft, nontender, no guarding, no peritoneal signs, and nondistended. BS +. No masses. EXTREMITIES: Without any  cyanosis, clubbing, rash, lesions or edema. NEUROLOGIC: AOx3, no focal motor deficit. SKIN: no jaundice, no rashes  Assessment/Plan 63 y.o.femaleappendical history of COPD, GERD, peptic ulcer disease complicated by bleeding requiring partial gastrectomy with Billroth I reconstruction complicated by recurrent stricture at the anastomosis, coming to the hospital to undergo endoscopic evaluation and management of recurrent gastroduodenal stricture.  We will proceed with EGD with possible balloon dilation of the gastroduodenal anastomosis.  Dolores Frame, MD 10/03/2020, 2:13 PM

## 2020-10-06 ENCOUNTER — Encounter (INDEPENDENT_AMBULATORY_CARE_PROVIDER_SITE_OTHER): Payer: Self-pay | Admitting: *Deleted

## 2020-10-06 ENCOUNTER — Other Ambulatory Visit (INDEPENDENT_AMBULATORY_CARE_PROVIDER_SITE_OTHER): Payer: Self-pay | Admitting: *Deleted

## 2020-10-07 ENCOUNTER — Other Ambulatory Visit (INDEPENDENT_AMBULATORY_CARE_PROVIDER_SITE_OTHER): Payer: Self-pay | Admitting: *Deleted

## 2020-10-07 ENCOUNTER — Encounter (INDEPENDENT_AMBULATORY_CARE_PROVIDER_SITE_OTHER): Payer: Self-pay | Admitting: *Deleted

## 2020-10-07 DIAGNOSIS — K209 Esophagitis, unspecified without bleeding: Secondary | ICD-10-CM

## 2020-10-08 ENCOUNTER — Encounter (HOSPITAL_COMMUNITY): Payer: Self-pay | Admitting: Gastroenterology

## 2020-11-04 NOTE — Patient Instructions (Signed)
Kristine Roberts  11/04/2020     @PREFPERIOPPHARMACY @   Your procedure is scheduled on  11/07/2020.  Report to 14/02/2020 at  0745  A.M.  Call this number if you have problems the morning of surgery:  [phone]   Remember:  Follow the diet instructions given to you by the office.                      Take these medicines the morning of surgery with A SIP OF WATER  Pepcid, hydrocodone(if needed).    Do not wear jewelry, make-up or nail polish.  Do not wear lotions, powders, or perfumes. Please wear deodorant and brush your teeth.  Do not shave 48 hours prior to surgery.  Men may shave face and neck.  Do not bring valuables to the hospital.  [facility] is not responsible for any belongings or valuables.  Contacts, dentures or bridgework may not be worn into surgery.  Leave your suitcase in the car.  After surgery it may be brought to your room.  For patients admitted to the hospital, discharge time will be determined by your treatment team.  Patients discharged the day of surgery will not be allowed to drive home.   Name and phone number of your driver:   family Special instructions:  DO NOT smoke the morning of your procedure.  Please read over the following fact sheets that you were given. Anesthesia Post-op Instructions and Care and Recovery After Surgery       Upper Endoscopy, Adult, Care After This sheet gives you information about how to care for yourself after your procedure. Your health care provider may also give you more specific instructions. If you have problems or questions, contact your health care provider. What can I expect after the procedure? After the procedure, it is common to have:  A sore throat.  Mild stomach pain or discomfort.  Bloating.  Nausea. Follow these instructions at home:   Follow instructions from your health care provider about what to eat or drink after your procedure.  Return to your normal activities as told by  your health care provider. Ask your health care provider what activities are safe for you.  Take over-the-counter and prescription medicines only as told by your health care provider.  Do not drive for 24 hours if you were given a sedative during your procedure.  Keep all follow-up visits as told by your health care provider. This is important. Contact a health care provider if you have:  A sore throat that lasts longer than one day.  Trouble swallowing. Get help right away if:  You vomit blood or your vomit looks like coffee grounds.  You have: ? A fever. ? Bloody, black, or tarry stools. ? A severe sore throat or you cannot swallow. ? Difficulty breathing. ? Severe pain in your chest or abdomen. Summary  After the procedure, it is common to have a sore throat, mild stomach discomfort, bloating, and nausea.  Do not drive for 24 hours if you were given a sedative during the procedure.  Follow instructions from your health care provider about what to eat or drink after your procedure.  Return to your normal activities as told by your health care provider. This information is not intended to replace advice given to you by your health care provider. Make sure you discuss any questions you have with your health care provider. Document Revised: 05/16/2018 Document Reviewed:  04/24/2018 Elsevier Patient Education  [place] After These instructions provide you with information about caring for yourself after your procedure. Your health care provider may also give you more specific instructions. Your treatment has been planned according to current medical practices, but problems sometimes occur. Call your health care provider if you have any problems or questions after your procedure. What can I expect after the procedure? After your procedure, you may:  Feel sleepy for several hours.  Feel clumsy and have poor balance for several  hours.  Feel forgetful about what happened after the procedure.  Have poor judgment for several hours.  Feel nauseous or vomit.  Have a sore throat if you had a breathing tube during the procedure. Follow these instructions at home: For at least 24 hours after the procedure:      Have a responsible adult stay with you. It is important to have someone help care for you until you are awake and alert.  Rest as needed.  Do not: ? Participate in activities in which you could fall or become injured. ? Drive. ? Use heavy machinery. ? Drink alcohol. ? Take sleeping pills or medicines that cause drowsiness. ? Make important decisions or sign legal documents. ? Take care of children on your own. Eating and drinking  Follow the diet that is recommended by your health care provider.  If you vomit, drink water, juice, or soup when you can drink without vomiting.  Make sure you have little or no nausea before eating solid foods. General instructions  Take over-the-counter and prescription medicines only as told by your health care provider.  If you have sleep apnea, surgery and certain medicines can increase your risk for breathing problems. Follow instructions from your health care provider about wearing your sleep device: ? Anytime you are sleeping, including during daytime naps. ? While taking prescription pain medicines, sleeping medicines, or medicines that make you drowsy.  If you smoke, do not smoke without supervision.  Keep all follow-up visits as told by your health care provider. This is important. Contact a health care provider if:  You keep feeling nauseous or you keep vomiting.  You feel light-headed.  You develop a rash.  You have a fever. Get help right away if:  You have trouble breathing. Summary  For several hours after your procedure, you may feel sleepy and have poor judgment.  Have a responsible adult stay with you for at least 24 hours or until  you are awake and alert. This information is not intended to replace advice given to you by your health care provider. Make sure you discuss any questions you have with your health care provider. Document Revised: 02/20/2018 Document Reviewed: 03/14/2016 Elsevier Patient Education  [place].

## 2020-11-06 ENCOUNTER — Encounter (HOSPITAL_COMMUNITY): Payer: Self-pay

## 2020-11-06 ENCOUNTER — Encounter (INDEPENDENT_AMBULATORY_CARE_PROVIDER_SITE_OTHER): Payer: Self-pay

## 2020-11-06 ENCOUNTER — Encounter (HOSPITAL_COMMUNITY)
Admission: RE | Admit: 2020-11-06 | Discharge: 2020-11-06 | Disposition: A | Discharge status: Home / Self Care | Payer: Medicare Other | Source: Ambulatory Visit | Attending: Gastroenterology | Admitting: Gastroenterology

## 2020-11-06 ENCOUNTER — Other Ambulatory Visit (HOSPITAL_COMMUNITY): Payer: Medicare Other

## 2020-12-16 NOTE — Patient Instructions (Signed)
Kristine Roberts  12/16/2020     @PREFPERIOPPHARMACY @   Your procedure is scheduled on  12/19/2020.  Report to 12/21/2020 at  [phone]   A.M.  Call this number if you have problems the morning of surgery:  [phone]   Remember:  Follow the diet instructions given to you by the office.                     Take these medicines the morning of surgery with A SIP OF WATER  Pepcid, hydrocodone(If needed).    Do not wear jewelry, make-up or nail polish.  Do not wear lotions, powders, or perfumes, or deodorant. Please brush your teeth.  Do not shave 48 hours prior to surgery.  Men may shave face and neck.  Do not bring valuables to the hospital.  [facility] is not responsible for any belongings or valuables.  Contacts, dentures or bridgework may not be worn into surgery.  Leave your suitcase in the car.  After surgery it may be brought to your room.  For patients admitted to the hospital, discharge time will be determined by your treatment team.  Patients discharged the day of surgery will not be allowed to drive home.   Name and phone number of your driver:   family Special instructions:  DO NOT smoke the morning of your procedure.  Please read over the following fact sheets that you were given. Anesthesia Post-op Instructions and Care and Recovery After Surgery       Upper Endoscopy, Adult, Care After This sheet gives you information about how to care for yourself after your procedure. Your health care provider may also give you more specific instructions. If you have problems or questions, contact your health care provider. What can I expect after the procedure? After the procedure, it is common to have:  A sore throat.  Mild stomach pain or discomfort.  Bloating.  Nausea. Follow these instructions at home:  Follow instructions from your health care provider about what to eat or drink after your procedure.  Return to your normal activities as told by your health  care provider. Ask your health care provider what activities are safe for you.  Take over-the-counter and prescription medicines only as told by your health care provider.  If you were given a sedative during the procedure, it can affect you for several hours. Do not drive or operate machinery until your health care provider says that it is safe.  Keep all follow-up visits as told by your health care provider. This is important.   Contact a health care provider if you have:  A sore throat that lasts longer than one day.  Trouble swallowing. Get help right away if:  You vomit blood or your vomit looks like coffee grounds.  You have: ? A fever. ? Bloody, black, or tarry stools. ? A severe sore throat or you cannot swallow. ? Difficulty breathing. ? Severe pain in your chest or abdomen. Summary  After the procedure, it is common to have a sore throat, mild stomach discomfort, bloating, and nausea.  If you were given a sedative during the procedure, it can affect you for several hours. Do not drive or operate machinery until your health care provider says that it is safe.  Follow instructions from your health care provider about what to eat or drink after your procedure.  Return to your normal activities as told by your health care provider. This  information is not intended to replace advice given to you by your health care provider. Make sure you discuss any questions you have with your health care provider. Document Revised: 11/20/2019 Document Reviewed: 04/24/2018 Elsevier Patient Education  2021 Elsevier Inc[employer] Monitored Anesthesia Care, Care After This sheet gives you information about how to care for yourself after your procedure. Your health care provider may also give you more specific instructions. If you have problems or questions, contact your health care provider. What can I expect after the procedure? After the procedure, it is common to  have:  Tiredness.  Forgetfulness about what happened after the procedure.  Impaired judgment for important decisions.  Nausea or vomiting.  Some difficulty with balance. Follow these instructions at home: For the time period you were told by your health care provider:  Rest as needed.  Do not participate in activities where you could fall or become injured.  Do not drive or use machinery.  Do not drink alcohol.  Do not take sleeping pills or medicines that cause drowsiness.  Do not make important decisions or sign legal documents.  Do not take care of children on your own.      Eating and drinking  Follow the diet that is recommended by your health care provider.  Drink enough fluid to keep your urine pale yellow.  If you vomit: ? Drink water, juice, or soup when you can drink without vomiting. ? Make sure you have little or no nausea before eating solid foods. General instructions  Have a responsible adult stay with you for the time you are told. It is important to have someone help care for you until you are awake and alert.  Take over-the-counter and prescription medicines only as told by your health care provider.  If you have sleep apnea, surgery and certain medicines can increase your risk for breathing problems. Follow instructions from your health care provider about wearing your sleep device: ? Anytime you are sleeping, including during daytime naps. ? While taking prescription pain medicines, sleeping medicines, or medicines that make you drowsy.  Avoid smoking.  Keep all follow-up visits as told by your health care provider. This is important. Contact a health care provider if:  You keep feeling nauseous or you keep vomiting.  You feel light-headed.  You are still sleepy or having trouble with balance after 24 hours.  You develop a rash.  You have a fever.  You have redness or swelling around the IV site. Get help right away if:  You have  trouble breathing.  You have new-onset confusion at home. Summary  For several hours after your procedure, you may feel tired. You may also be forgetful and have poor judgment.  Have a responsible adult stay with you for the time you are told. It is important to have someone help care for you until you are awake and alert.  Rest as told. Do not drive or operate machinery. Do not drink alcohol or take sleeping pills.  Get help right away if you have trouble breathing, or if you suddenly become confused. This information is not intended to replace advice given to you by your health care provider. Make sure you discuss any questions you have with your health care provider. Document Revised: 08/07/2020 Document Reviewed: 10/25/2019 Elsevier Patient Education  2021 [employer].

## 2020-12-17 ENCOUNTER — Other Ambulatory Visit: Payer: Self-pay

## 2020-12-17 ENCOUNTER — Other Ambulatory Visit (HOSPITAL_COMMUNITY)
Admission: RE | Admit: 2020-12-17 | Discharge: 2020-12-17 | Disposition: A | Discharge status: Home / Self Care | Payer: Medicare Other | Source: Ambulatory Visit | Attending: Gastroenterology | Admitting: Gastroenterology

## 2020-12-17 ENCOUNTER — Encounter (HOSPITAL_COMMUNITY)
Admission: RE | Admit: 2020-12-17 | Discharge: 2020-12-17 | Disposition: A | Discharge status: Home / Self Care | Payer: Medicare Other | Source: Ambulatory Visit | Attending: Gastroenterology | Admitting: Gastroenterology

## 2020-12-17 ENCOUNTER — Encounter (HOSPITAL_COMMUNITY): Payer: Self-pay

## 2020-12-17 DIAGNOSIS — Z20822 Contact with and (suspected) exposure to covid-19: Secondary | ICD-10-CM | POA: Diagnosis not present

## 2020-12-17 DIAGNOSIS — Z01812 Encounter for preprocedural laboratory examination: Secondary | ICD-10-CM | POA: Diagnosis not present

## 2020-12-17 DIAGNOSIS — K209 Esophagitis, unspecified without bleeding: Secondary | ICD-10-CM

## 2020-12-17 LAB — BASIC METABOLIC PANEL
Anion gap: 10 (ref 5–15)
BUN: 11 mg/dL (ref 8–23)
CO2: 28 mmol/L (ref 22–32)
Calcium: 8.5 mg/dL — ABNORMAL LOW (ref 8.9–10.3)
Chloride: 103 mmol/L (ref 98–111)
Creatinine, Ser: 0.46 mg/dL (ref 0.44–1.00)
GFR, Estimated: >60 mL/min (ref >60)
Glucose, Bld: 119 mg/dL — ABNORMAL HIGH (ref 70–99)
Potassium: 4.5 mmol/L (ref 3.5–5.1)
Sodium: 141 mmol/L (ref 135–145)

## 2020-12-17 LAB — CBC WITH DIFFERENTIAL/PLATELET
Abs Immature Granulocytes: 0.01 10*3/uL (ref 0.00–0.07)
Basophils Absolute: 0.1 10*3/uL (ref 0.0–0.1)
Basophils Relative: 1 %
Eosinophils Absolute: 0.2 10*3/uL (ref 0.0–0.5)
Eosinophils Relative: 4 %
HCT: 40.8 % (ref 36.0–46.0)
Hemoglobin: 12.2 g/dL (ref 12.0–15.0)
Immature Granulocytes: 0 %
Lymphocytes Relative: 36 %
Lymphs Abs: 2.1 10*3/uL (ref 0.7–4.0)
MCH: 29.8 pg (ref 26.0–34.0)
MCHC: 29.9 g/dL — ABNORMAL LOW (ref 30.0–36.0)
MCV: 99.8 fL (ref 80.0–100.0)
Monocytes Absolute: 0.7 10*3/uL (ref 0.1–1.0)
Monocytes Relative: 12 %
Neutro Abs: 2.7 10*3/uL (ref 1.7–7.7)
Neutrophils Relative %: 47 %
Platelets: 280 10*3/uL (ref 150–400)
RBC: 4.09 MIL/uL (ref 3.87–5.11)
RDW: 16.5 % — ABNORMAL HIGH (ref 11.5–15.5)
WBC: 5.8 10*3/uL (ref 4.0–10.5)
nRBC: 0 % (ref 0.0–0.2)

## 2020-12-17 LAB — SARS CORONAVIRUS 2 (TAT 6-24 HRS): SARS Coronavirus 2: NEGATIVE

## 2020-12-19 ENCOUNTER — Ambulatory Visit (HOSPITAL_COMMUNITY): Payer: Medicare Other | Admitting: Anesthesiology

## 2020-12-19 ENCOUNTER — Encounter (HOSPITAL_COMMUNITY)
Admission: RE | Disposition: A | Discharge status: Home / Self Care | Payer: Self-pay | Source: Ambulatory Visit | Attending: Gastroenterology

## 2020-12-19 ENCOUNTER — Ambulatory Visit (HOSPITAL_COMMUNITY)
Admission: RE | Admit: 2020-12-19 | Discharge: 2020-12-19 | Disposition: A | Discharge status: Home / Self Care | Payer: Medicare Other | Source: Ambulatory Visit | Attending: Gastroenterology | Admitting: Gastroenterology

## 2020-12-19 DIAGNOSIS — K209 Esophagitis, unspecified without bleeding: Secondary | ICD-10-CM

## 2020-12-19 DIAGNOSIS — Z903 Acquired absence of stomach [part of]: Secondary | ICD-10-CM | POA: Diagnosis not present

## 2020-12-19 DIAGNOSIS — Z980 Intestinal bypass and anastomosis status: Secondary | ICD-10-CM | POA: Insufficient documentation

## 2020-12-19 DIAGNOSIS — Z8711 Personal history of peptic ulcer disease: Secondary | ICD-10-CM | POA: Diagnosis not present

## 2020-12-19 DIAGNOSIS — K9189 Other postprocedural complications and disorders of digestive system: Secondary | ICD-10-CM | POA: Insufficient documentation

## 2020-12-19 DIAGNOSIS — Z79899 Other long term (current) drug therapy: Secondary | ICD-10-CM | POA: Insufficient documentation

## 2020-12-19 DIAGNOSIS — J449 Chronic obstructive pulmonary disease, unspecified: Secondary | ICD-10-CM | POA: Diagnosis not present

## 2020-12-19 DIAGNOSIS — K2100 Gastro-esophageal reflux disease with esophagitis, without bleeding: Secondary | ICD-10-CM | POA: Diagnosis not present

## 2020-12-19 DIAGNOSIS — F17210 Nicotine dependence, cigarettes, uncomplicated: Secondary | ICD-10-CM | POA: Diagnosis not present

## 2020-12-19 HISTORY — PX: ESOPHAGOGASTRODUODENOSCOPY (EGD) WITH PROPOFOL: SHX5813

## 2020-12-19 HISTORY — PX: BALLOON DILATION: SHX5330

## 2020-12-19 SURGERY — ESOPHAGOGASTRODUODENOSCOPY (EGD) WITH PROPOFOL
Anesthesia: General

## 2020-12-19 MED ORDER — FENTANYL CITRATE (PF) 100 MCG/2ML IJ SOLN
INTRAMUSCULAR | Status: DC | PRN
  Administered 2020-12-19: 50 ug via INTRAVENOUS
  Administered 2020-12-19 (×2): 25 ug via INTRAVENOUS

## 2020-12-19 MED ORDER — LIDOCAINE VISCOUS HCL 2 % MT SOLN
10.0000 mL | Freq: Once | OROMUCOSAL | Status: AC
  Administered 2020-12-19: 10 mL via OROMUCOSAL

## 2020-12-19 MED ORDER — PROPOFOL 10 MG/ML IV BOLUS
INTRAVENOUS | Status: DC | PRN
  Administered 2020-12-19: 50 mg via INTRAVENOUS
  Administered 2020-12-19 (×2): 20 mg via INTRAVENOUS
  Administered 2020-12-19: 10 mg via INTRAVENOUS
  Administered 2020-12-19: 20 mg via INTRAVENOUS

## 2020-12-19 MED ORDER — DEXLANSOPRAZOLE 60 MG PO CPDR: 60.0000 mg | DELAYED_RELEASE_CAPSULE | Freq: Every day | ORAL | 5 refills | Status: DC

## 2020-12-19 MED ORDER — CHLORHEXIDINE GLUCONATE CLOTH 2 % EX PADS: 6.0000 | MEDICATED_PAD | Freq: Once | CUTANEOUS | Status: DC

## 2020-12-19 MED ORDER — LACTATED RINGERS IV SOLN: INTRAVENOUS | Status: DC | PRN

## 2020-12-19 MED ORDER — FENTANYL CITRATE (PF) 100 MCG/2ML IJ SOLN
INTRAMUSCULAR | Status: AC
  Filled 2020-12-19: qty 2

## 2020-12-19 MED ORDER — LACTATED RINGERS IV SOLN: Freq: Once | INTRAVENOUS | Status: AC

## 2020-12-19 MED ORDER — LIDOCAINE VISCOUS HCL 2 % MT SOLN
OROMUCOSAL | Status: AC
  Filled 2020-12-19: qty 15

## 2020-12-19 MED ORDER — EPHEDRINE SULFATE 50 MG/ML IJ SOLN
INTRAMUSCULAR | Status: DC | PRN
  Administered 2020-12-19 (×4): 10 mg via INTRAVENOUS

## 2020-12-19 NOTE — Anesthesia Preprocedure Evaluation (Addendum)
Anesthesia Evaluation  Patient identified by MRN, date of birth, ID band Patient awake    Reviewed: Allergy & Precautions, NPO status , Patient's Chart, lab work & pertinent test results  History of Anesthesia Complications Negative for: history of anesthetic complications  Airway Mallampati: II  TM Distance: >3 FB Neck ROM: Full    Dental  (+) Edentulous Upper, Edentulous Lower   Pulmonary pneumonia, resolved, COPD,  COPD inhaler, Current SmokerPatient did not abstain from smoking.,    breath sounds clear to auscultation       Cardiovascular Exercise Tolerance: Poor + dysrhythmias  Rhythm:Irregular Rate:Abnormal  [place] Foley and tachy 25-Jun-2020 14:40:16 [facility] ROUTINE RECORD Sinus rhythm Consider left atrial enlargement Right bundle branch block   Neuro/Psych PSYCHIATRIC DISORDERS    GI/Hepatic Neg liver ROS, hiatal hernia, PUD, GERD  Medicated,  Endo/Other  negative endocrine ROS  Renal/GU negative Renal ROS  negative genitourinary   Musculoskeletal negative musculoskeletal ROS (+)   Abdominal   Peds  Hematology  (+) anemia ,   Anesthesia Other Findings Severe malnutrition   Reproductive/Obstetrics negative OB ROS                          Anesthesia Physical  Anesthesia Plan  ASA: III  Anesthesia Plan: General   Post-op Pain Management:    Induction: Intravenous  PONV Risk Score and Plan: TIVA  Airway Management Planned: Nasal Cannula and Natural Airway  Additional Equipment:   Intra-op Plan:   Post-operative Plan:   Informed Consent: I have reviewed the patients History and Physical, chart, labs and discussed the procedure including the risks, benefits and alternatives for the proposed anesthesia with the patient or authorized representative who has indicated his/her understanding and acceptance.       Plan Discussed with: CRNA and  Surgeon  Anesthesia Plan Comments:         Anesthesia Quick Evaluation

## 2020-12-19 NOTE — Anesthesia Postprocedure Evaluation (Signed)
Anesthesia Post Note  Patient: Kristine Roberts  Procedure(s) Performed: ESOPHAGOGASTRODUODENOSCOPY (EGD) WITH PROPOFOL (N/A ) BALLOON DILATION  Patient location during evaluation: PACU Anesthesia Type: General Level of consciousness: awake, oriented, awake and alert and patient cooperative Pain management: satisfactory to patient Vital Signs Assessment: post-procedure vital signs reviewed and stable Respiratory status: spontaneous breathing, respiratory function stable and nonlabored ventilation Cardiovascular status: stable Postop Assessment: no apparent nausea or vomiting Anesthetic complications: no   No complications documented.   Last Vitals:  Vitals:   12/19/20 0639  BP: 131/69  Pulse: (!) 46  Resp: 18  SpO2: 94%    Last Pain:  Vitals:   12/19/20 0745  PainSc: 8                  Dailyn Kempner

## 2020-12-19 NOTE — Transfer of Care (Signed)
Immediate Anesthesia Transfer of Care Note  Patient: Kristine Roberts  Procedure(s) Performed: ESOPHAGOGASTRODUODENOSCOPY (EGD) WITH PROPOFOL (N/A ) BALLOON DILATION  Patient Location: PACU  Anesthesia Type:General  Level of Consciousness: awake, alert , oriented and patient cooperative  Airway & Oxygen Therapy: Patient Spontanous Breathing  Post-op Assessment: Report given to RN, Post -op Vital signs reviewed and stable and Patient moving all extremities X 4  Post vital signs: Reviewed and stable  Last Vitals:  Vitals Value Taken Time  BP    Temp    Pulse 76 12/19/20 0809  Resp 14 12/19/20 0809  SpO2 97 % 12/19/20 0809  Vitals shown include unvalidated device data.  Last Pain:  Vitals:   12/19/20 0745  PainSc: 8       Patients Stated Pain Goal: 8 (12/19/20 8333)  Complications: No complications documented.

## 2020-12-19 NOTE — Op Note (Signed)
[facility] Patient Name: Kristine Roberts Procedure Date: 12/19/2020 7:25 AM MRN: [id number] Date of Birth: May 08, 1956 Attending MD: Katrinka Blazing ,  CSN: [id number] Age: 65 Admit Type: Outpatient Procedure:                Upper GI endoscopy Indications:               Providers:                Katrinka Blazing, Crystal Page, Pandora Leiter,                            Technician Referring MD:              Medicines:                Monitored Anesthesia Care Complications:            No immediate complications. Estimated Blood Loss:     Estimated blood loss: none. Procedure:                Pre-Anesthesia Assessment:                           - Prior to the procedure, a History and Physical                            was performed, and patient medications, allergies                            and sensitivities were reviewed. The patient's                            tolerance of previous anesthesia was reviewed.                           - The risks and benefits of the procedure and the                            sedation options and risks were discussed with the                            patient. All questions were answered and informed                            consent was obtained.                           After obtaining informed consent, the endoscope was                            passed under direct vision. Throughout the                            procedure, the patient's blood pressure, pulse, and                            oxygen saturations were monitored continuously. The  [id number]-H190 ([id number]) scope was introduced through the                            mouth, and advanced to the proximal jejunum. The                            upper GI endoscopy was accomplished without                            difficulty. The patient tolerated the procedure                            well. Scope In: 7:53:21 AM Scope Out: 8:03:41 AM Total Procedure  Duration: 0 hours 10 minutes 20 seconds  Findings:      LA Grade B (one or more mucosal breaks greater than 5 mm, not extending       between the tops of two mucosal folds) esophagitis with no bleeding was       found in the lower third of the esophagus. This looked better compared       to previous examination.      A medium amount of food (residue) was found in the gastric body.      Evidence of a stenosed Billroth II gastrojejunostomy was found. The       gastrojejunal anastomosis was characterized by congestion, edema and       ulceration. This was traversed, spontaneous scant blood oozing was       observed when this was performed. The efferent limb was examined. The       afferent limb was examined. A TTS dilator was passed through the scope.       Dilation with a 09-16-11 mm anastomotic balloon dilator was performed.       Max size of the balloon was 12 mm.      The examined jejunum was normal. Impression:               - LA Grade B reflux esophagitis with no bleeding.                           - A medium amount of food (residue) in the stomach.                           - Stenosed Billroth II gastrojejunostomy was found,                            characterized by congestion, edema and ulceration.                            Dilated.                           - Normal examined jejunum.                           - No specimens collected. Moderate Sedation:      Per Anesthesia Care Recommendation:           - Discharge patient to home (ambulatory).                           -  Resume previous diet. try to eat smaller meals                            (5-6 times a day) and chew food                           thoroughly.                           - Repeat upper endoscopy in 4-6 weeks for                            retreatment. PLEASE DO NOT EAT SOLID FOOD 3 DAYS                            PRIOR TO YOUR NEXT EGD.                           - Continue present medications, including  Dexilant                            30 mg BID.                           - Smoking cessation is very important                           - Avoid Goody powders, BC powders or other NSAIDs. Procedure Code(s):        --- Professional ---                           [phone], Esophagogastroduodenoscopy, flexible,                            transoral; with dilation of gastric/duodenal                            stricture(s) (eg, balloon, bougie) Diagnosis Code(s):        --- Professional ---                           K21.00, Gastro-esophageal reflux disease with                            esophagitis, without bleeding                           Z98.0, Intestinal bypass and anastomosis status CPT copyright 2019 American Medical Association. All rights reserved. The codes documented in this report are preliminary and upon coder review may  be revised to meet current compliance requirements. Katrinka Blazing, MD Katrinka Blazing,  12/19/2020 8:12:47 AM This report has been signed electronically. Number of Addenda: 0

## 2020-12-19 NOTE — Progress Notes (Signed)
Dr Levon Hedger at bedside to talk with pt. Cleared for d/c to home.

## 2020-12-19 NOTE — Discharge Instructions (Signed)
Upper Endoscopy, Adult, Care After This sheet gives you information about how to care for yourself after your procedure. Your health care provider may also give you more specific instructions. If you have problems or questions, contact your health care provider. What can I expect after the procedure? After the procedure, it is common to have:  A sore throat.  Mild stomach pain or discomfort.  Bloating.  Nausea. Follow these instructions at home:  Follow instructions from your health care provider about what to eat or drink after your procedure.  Return to your normal activities as told by your health care provider. Ask your health care provider what activities are safe for you.  Take over-the-counter and prescription medicines only as told by your health care provider.  If you were given a sedative during the procedure, it can affect you for several hours. Do not drive or operate machinery until your health care provider says that it is safe.  Keep all follow-up visits as told by your health care provider. This is important.   Contact a health care provider if you have:  A sore throat that lasts longer than one day.  Trouble swallowing. Get help right away if:  You vomit blood or your vomit looks like coffee grounds.  You have: ? A fever. ? Bloody, black, or tarry stools. ? A severe sore throat or you cannot swallow. ? Difficulty breathing. ? Severe pain in your chest or abdomen. Summary  After the procedure, it is common to have a sore throat, mild stomach discomfort, bloating, and nausea.  If you were given a sedative during the procedure, it can affect you for several hours. Do not drive or operate machinery until your health care provider says that it is safe.  Follow instructions from your health care provider about what to eat or drink after your procedure.  Return to your normal activities as told by your health care provider. This information is not intended to  replace advice given to you by your health care provider. Make sure you discuss any questions you have with your health care provider. Document Revised: 11/20/2019 Document Reviewed: 04/24/2018 Elsevier Patient Education  2021 Elsevier Inc. Esophagitis  Esophagitis is inflammation of the esophagus. The esophagus is the tube that carries food from the mouth to the stomach. Esophagitis can cause soreness or pain in the esophagus. This condition can make it difficult and painful to swallow. What are the causes? Most causes of esophagitis are not serious. Common causes of this condition include:  Gastroesophageal reflux disease (GERD). This is when stomach contents move back up into the esophagus (reflux).  Repeated vomiting.  An allergic reaction, especially caused by food allergies (eosinophilic esophagitis).  Injury to the esophagus by swallowing large pills with or without water, or swallowing certain types of medicines.  Swallowing harmful chemicals, such as household cleaning products.  Drinking a lot of alcohol.  An infection of the esophagus. This most often occurs in people who have a weakened immune system.  Radiation or chemotherapy treatment for cancer.  Certain diseases such as sarcoidosis, Crohn's disease, and scleroderma. What are the signs or symptoms? Symptoms of this condition include:  Difficult or painful swallowing.  Pain with swallowing acidic liquids, such as citrus juices. You may also have pain when you burp.  Chest pain and difficulty breathing.  Nausea and vomiting.  Pain in the abdomen.  Weight loss.  Ulcers in the mouth and white patches in the mouth (candidiasis).  Fever.  Coughing  up blood or vomiting blood.  Stool that is black, tarry, or bright red. How is this diagnosed? This condition may be diagnosed based on your medical history and a physical exam. You may also have other tests, including:  A test to examine your esophagus and  stomach with a small flexible tube with a camera (endoscopy).  A test that measures the acidity level in your esophagus.  A test that measures how much pressure is on your esophagus.  A barium swallow or modified barium swallow to show the shape, size, and functioning of your esophagus.  Allergy tests. How is this treated? Treatment for this condition depends on the cause of your esophagitis. In some cases, steroids or other medicines may be given to help relieve your symptoms or to treat the underlying cause of your condition. You may have to make some lifestyle changes, such as:  Avoiding alcohol.  Quitting any products that contain nicotine or tobacco. These products include cigarettes, chewing tobacco, and vaping devices, such as e-cigarettes. If you need help quitting, ask your health care provider.  Changing your diet.  Exercising.  Changing your sleep habits and your sleep environment. Follow these instructions at home: Medicines  Take over-the-counter and prescription medicines only as told by your health care provider.  Do not take aspirin, ibuprofen, or other NSAIDs unless your health care provider told you to do so.  If you have trouble taking pills: ? Use a pill splitter to decrease the size of the pill. This will decrease the chance of the pill getting stuck or injuring your esophagus. ? Drink water after you take a pill. Eating and drinking  Avoid foods and drinks that seem to make your symptoms worse.  Follow a diet as recommended by your health care provider. This may involve avoiding foods and drinks such as: ? Coffee and tea, with or without caffeine. ? Drinks that contain alcohol. ? Energy drinks and sports drinks. ? Carbonated drinks or sodas. ? Chocolate and cocoa. ? Peppermint and mint flavorings. ? Garlic and onions. ? Horseradish. ? Spicy and acidic foods, including peppers, chili powder, curry powder, vinegar, hot sauces, and barbecue  sauce. ? Citrus fruit juices and citrus fruits, such as oranges, lemons, and limes. ? Tomato-based foods, such as red sauce, chili, salsa, and pizza with red sauce. ? Fried and fatty foods, such as donuts, french fries, potato chips, and high-fat dressings. ? High-fat meats, such as hot dogs and fatty cuts of red and white meats, such as rib eye steak, sausage, ham, and bacon. ? High-fat dairy items, such as whole milk, butter, and cream cheese.   Lifestyle  Eat small, frequent meals instead of large meals.  Avoid drinking large amounts of liquid with your meals.  Avoid eating meals during the 2-3 hours before bedtime.  Avoid lying down right after you eat.  Do not exercise right after you eat.  Do not use any products that contain nicotine or tobacco. These products include cigarettes, chewing tobacco, and vaping devices, such as e-cigarettes. If you need help quitting, ask your health care provider. General instructions  Pay attention to any changes in your symptoms. Let your health care provider know about them.  Wear loose-fitting clothing. Do not wear anything tight around your waist that causes pressure on your abdomen.  Raise (elevate) the head of your bed about 6 inches (15 cm). You may need to use a wedge to do this.  Try relaxation strategies such as yoga, deep breathing,  or meditation to manage stress. If you need help reducing stress, ask your health care provider.  If you are overweight, reduce your weight to an amount that is healthy for you. Ask your health care provider for guidance about a safe weight loss goal.  Keep all follow-up visits. This is important.   Contact a health care provider if:  You have new symptoms.  You have unexplained weight loss.  You have difficulty swallowing, or it hurts to swallow.  You have wheezing or a cough that does not go away.  Your symptoms do not improve with treatment.  You have frequent heartburn for more than two  weeks. Get help right away if:  You have sudden severe pain in your arms, neck, jaw, teeth, or back.  You suddenly feel sweaty, dizzy, or light-headed.  You have chest pain or shortness of breath.  You vomit and the vomit is green, yellow, or black, or it looks like blood or coffee grounds.  Your stool is red, bloody, or black.  You have a fever.  You cannot swallow, drink, or eat. These symptoms may represent a serious problem that is an emergency. Do not wait to see if the symptoms will go away. Get medical help right away. Call your local emergency services (911 in the [place] Do not drive yourself to the hospital. Summary  Esophagitis is inflammation of the esophagus.  Most causes of esophagitis are not serious.  Follow your health care provider's instructions about eating and drinking.  Contact a health care provider if you have new symptoms, have weight loss, or coughing that does not stop.  Get help right away if you have severe pain in the arms, neck, jaw, teeth, or back, or if you have chest pain, shortness of breath, or fever. This information is not intended to replace advice given to you by your health care provider. Make sure you discuss any questions you have with your health care provider. Document Revised: 06/02/2020 Document Reviewed: 06/02/2020 Elsevier Patient Education  2021 Elsevier Inc. You are being discharged to home.  Resume your previous diet. try to eat smaller meals (5-6 times a day) and chew food thoroughly. Repeat upper endoscopy in 4-6 weeks for retreatment. PLEASE DO NOT EAT SOLID FOOD 3 DAYS PRIOR TO YOUR NEXT EGD. Continue present medications, including Dexilant 30 mg BID. Smoking cessation is very important Avoid Goody powders, BC powders or other NSAIDs.

## 2020-12-19 NOTE — H&P (Addendum)
Kristine Roberts is an 65 y.o. female.   Chief Complaint: Esophagitis and gastrojejunal stricture HPI: 65 y.o.femaleappendical history of COPD, GERD, peptic ulcer disease complicated by bleeding requiring partial gastrectomy with Billroth I reconstruction complicated by recurrent stricture at the anastomosis,coming to the hospital to undergo endoscopic evaluation and management of recurrent gastroduodenal stricture.  The patient reported that she is still having frequent vomiting at least once a day.  He is able to eat more than in the past.  Last EGD was performed 10/03/2020. The patient was found to have grade C esophagitis and a stenosis gastrojejunal stricture which was dilated up to 12 mm with a balloon. she has been compliant with intake of Dexilant.  Has also avoided taking any BC powders or Goody powders.  However, she is still smoking.  Has not lost any more weight but reports feeling hungry and wants to eat more than usual.  Notably, the patient was scheduled to undergo repeat dilation in December but she canceled her procedure.  Past Medical History:  Diagnosis Date  . COPD (chronic obstructive pulmonary disease) ([place])   . Dysphagia   . Dysrhythmia   . Esophagitis   . Gastric outlet obstruction   . Gastric stenosis   . GERD (gastroesophageal reflux disease)   . Hiatal hernia   . Osteoporosis   . PUD (peptic ulcer disease)   . Wounds, multiple     Past Surgical History:  Procedure Laterality Date  . APPENDECTOMY    . BALLOON DILATION N/A 03/05/2016   Procedure: BALLOON DILATION;  Surgeon: Rogene Houston, MD;  [facility];  Service: Endoscopy;  Laterality: N/A;  pyloric channel dialtaion  . BALLOON DILATION N/A 07/14/2018   Procedure: BALLOON DILATION;  Surgeon: Jackquline Denmark, MD;  [facility]: [facility] ENDOSCOPY;  Service: Endoscopy;  Laterality: N/A;  . BALLOON DILATION  10/03/2020   Procedure: BALLOON DILATION;  Surgeon: Montez Morita, Quillian Quince, MD;  [facility];  Service: Gastroenterology;;  . BIOPSY  07/14/2018   Procedure: BIOPSY;  Surgeon: Jackquline Denmark, MD;  Location: [facility] ENDOSCOPY;  Service: Endoscopy;;  . BIOPSY  07/03/2020   Procedure: BIOPSY;  Surgeon: Lavena Bullion, DO;  Location: [place];  Service: Gastroenterology;;  . CHOLECYSTECTOMY    . COLONOSCOPY WITH PROPOFOL N/A 08/01/2020   Procedure: COLONOSCOPY WITH PROPOFOL;  Surgeon: Harvel Quale, MD;  [facility];  Service: Gastroenterology;  Laterality: N/A;  1130  . ESOPHAGEAL DILATION N/A 03/03/2016   Procedure: ESOPHAGEAL DILATION;  Surgeon: Rogene Houston, MD;  [facility];  Service: Endoscopy;  Laterality: N/A;  . ESOPHAGEAL DILATION N/A 05/07/2016   Procedure: ESOPHAGEAL DILATION;  Surgeon: Rogene Houston, MD;  [facility];  Service: Endoscopy;  Laterality: N/A;  . ESOPHAGEAL DILATION  01/26/2018   Procedure: DILATION OF ANASTOMOTIC  STRICTURE;  Surgeon: Rogene Houston, MD;  [facility];  Service: Endoscopy;;  . ESOPHAGEAL DILATION N/A 08/01/2020   Procedure: ESOPHAGEAL DILATION;  Surgeon: Harvel Quale, MD;  [facility];  Service: Gastroenterology;  Laterality: N/A;  . ESOPHAGOGASTRODUODENOSCOPY N/A 03/03/2016   Procedure: ESOPHAGOGASTRODUODENOSCOPY (EGD);  Surgeon: Rogene Houston, MD;  [facility];  Service: Endoscopy;  Laterality: N/A;  2:00  . ESOPHAGOGASTRODUODENOSCOPY N/A 05/07/2016   Procedure: ESOPHAGOGASTRODUODENOSCOPY (EGD);  Surgeon: Rogene Houston, MD;  [facility];  Service: Endoscopy;  Laterality: N/A;  855 - moved to 6/2 @ 10:15 - Ann notified pt  .  ESOPHAGOGASTRODUODENOSCOPY N/A 12/28/2017   Procedure: ESOPHAGOGASTRODUODENOSCOPY (EGD) with stricture dilation;  Surgeon: Rogene Houston, MD;  [facility];  Service: Endoscopy;  Laterality: N/A;  . ESOPHAGOGASTRODUODENOSCOPY N/A 01/26/2018   Procedure: ESOPHAGOGASTRODUODENOSCOPY (EGD);  Surgeon:  Rogene Houston, MD;  [facility];  Service: Endoscopy;  Laterality: N/A;  255  . ESOPHAGOGASTRODUODENOSCOPY N/A 07/14/2018   Procedure: ESOPHAGOGASTRODUODENOSCOPY (EGD);  Surgeon: Jackquline Denmark, MD;  Location[facility] ENDOSCOPY;  Service: Endoscopy;  Laterality: N/A;  . ESOPHAGOGASTRODUODENOSCOPY (EGD) WITH PROPOFOL N/A 03/05/2016   Procedure: ESOPHAGOGASTRODUODENOSCOPY (EGD) WITH PROPOFOL Anastomotic stricture dilation ;  Surgeon: Rogene Houston, MD;  [facility];  Service: Endoscopy;  Laterality: N/A;  to be done in OR under fluoro  . ESOPHAGOGASTRODUODENOSCOPY (EGD) WITH PROPOFOL N/A 08/22/2019   Procedure: ESOPHAGOGASTRODUODENOSCOPY (EGD) WITH PROPOFOL With anastomotic stricture dilation.;  Surgeon: Rogene Houston, MD;  [facility];  Service: Endoscopy;  Laterality: N/A;  . ESOPHAGOGASTRODUODENOSCOPY (EGD) WITH PROPOFOL N/A 08/24/2019   Procedure: ESOPHAGOGASTRODUODENOSCOPY (EGD) WITH PROPOFOL with stricture dilation;  Surgeon: Rogene Houston, MD;  [facility];  Service: Endoscopy;  Laterality: N/A;  . ESOPHAGOGASTRODUODENOSCOPY (EGD) WITH PROPOFOL N/A 07/03/2020   Procedure: ESOPHAGOGASTRODUODENOSCOPY (EGD) WITH PROPOFOL;  Surgeon: Lavena Bullion, DO;  Location: [place];  Service: Gastroenterology;  Laterality: N/A;  . ESOPHAGOGASTRODUODENOSCOPY (EGD) WITH PROPOFOL N/A 08/01/2020   Procedure: ESOPHAGOGASTRODUODENOSCOPY (EGD) WITH PROPOFOL;  Surgeon: Harvel Quale, MD;  [facility];  Service: Gastroenterology;  Laterality: N/A;  . ESOPHAGOGASTRODUODENOSCOPY (EGD) WITH PROPOFOL N/A 08/29/2020   Procedure: ESOPHAGOGASTRODUODENOSCOPY (EGD) WITH PROPOFOL;  Surgeon: Harvel Quale, MD;  [facility];  Service: Gastroenterology;  Laterality: N/A;  10:00  . ESOPHAGOGASTRODUODENOSCOPY (EGD) WITH PROPOFOL N/A 10/03/2020   Procedure: ESOPHAGOGASTRODUODENOSCOPY (EGD) WITH PROPOFOL;  Surgeon: Harvel Quale, MD;  [facility];  Service: Gastroenterology;  Laterality: N/A;  200  . FOREIGN BODY REMOVAL  07/03/2020   Procedure: FOREIGN BODY REMOVAL;  Surgeon: Lavena Bullion, DO;  Location: [place] ENDOSCOPY;  Service: Gastroenterology;;  . HIP SURGERY Right 09/2020  . HIP SURGERY Left 07/2020  . INTRAMEDULLARY (IM) NAIL INTERTROCHANTERIC Right 07/09/2018   Procedure: INTRAMEDULLARY (IM) NAIL INTERTROCHANTRIC;  Surgeon: Nicholes Stairs, MD;  Location: [place];  Service: Orthopedics;  Laterality: Right;  . INTRAMEDULLARY (IM) NAIL INTERTROCHANTERIC Left 06/27/2020   Procedure: INTRAMEDULLARY (IM) NAIL INTERTROCHANTRIC;  Surgeon: Nicholes Stairs, MD;  Location: [place];  Service: Orthopedics;  Laterality: Left;  . KENALOG INJECTION  07/03/2020   Procedure: KENALOG INJECTION;  Surgeon: Lavena Bullion, DO;  Location: [place];  Service: Gastroenterology;;  . STOMACH SURGERY    . TOTAL HIP ARTHROPLASTY Bilateral     Family History  Problem Relation Age of Onset  . Alzheimer's disease Mother   . COPD Mother   . Throat cancer Father   . Breast cancer Sister    Social History:  reports that she has been smoking cigarettes. She has a 30.00 pack-year smoking history. She has never used smokeless tobacco. She reports previous alcohol use of about 12.0 standard drinks of alcohol per week. She reports that she does not use drugs.  Allergies:  Allergies  Allergen Reactions  . Penicillins Itching, Swelling and Other (See Comments)    Tongue swells Has patient had a PCN reaction causing immediate rash, facial/tongue/throat swelling, SOB or lightheadedness with hypotension: Yes, had a rash and tongue swelled up. No SOB and lightheadedness. Has patient had  a PCN reaction causing severe rash involving mucus membranes or skin necrosis: No Has patient had a PCN reaction that required hospitalization: No Has patient had a PCN reaction occurring within the last 10 years: No If all of  the above answers are "NO", then may procee    Medications Prior to Admission  Medication Sig Dispense Refill  . baclofen (LIORESAL) 10 MG tablet Take 10 mg by mouth at bedtime.    . Calcium-Vitamin D (CALTRATE 600 PLUS-VIT D PO) Take 1 tablet by mouth daily.    . Cyanocobalamin 1000 MCG/ML KIT Inject 1,000 mcg as directed every 30 (thirty) days.    . famotidine (PEPCID) 20 MG tablet Take 20 mg by mouth daily.    . Multiple Vitamin (MULTIVITAMIN WITH MINERALS) TABS tablet Take 1 tablet by mouth daily. Multivitamin for Women 50+    . Potassium 99 MG TABS Take 99 mg by mouth daily.    . Tetrahydrozoline HCl (VISINE OP) Place 1 drop into both eyes every other day.     . traZODone (DESYREL) 100 MG tablet Take 200 mg by mouth at bedtime.     . vitamin B-12 (CYANOCOBALAMIN) 1000 MCG tablet Take 1,000 mcg by mouth daily.    Marland Kitchen EPINEPHrine (PRIMATENE MIST) 0.125 MG/ACT AERO Inhale 1-2 puffs into the lungs daily as needed (shortness of breath).      Results for orders placed or performed during the hospital encounter of 12/17/20 (from the past 48 hour(s))  SARS CORONAVIRUS 2 (TAT 6-24 HRS) Nasopharyngeal Nasopharyngeal Swab     Status: None   Collection Time: 12/17/20  9:35 AM   Specimen: Nasopharyngeal Swab  Result Value Ref Range   SARS Coronavirus 2 NEGATIVE NEGATIVE    Comment: (NOTE) SARS-CoV-2 target nucleic acids are NOT DETECTED.  The SARS-CoV-2 RNA is generally detectable in upper and lower respiratory specimens during the acute phase of infection. Negative results do not preclude SARS-CoV-2 infection, do not rule out co-infections with other pathogens, and should not be used as the sole basis for treatment or other patient management decisions. Negative results must be combined with clinical observations, patient history, and epidemiological information. The expected result is Negative.  Fact Sheet for Patients: SugarRoll.be  Fact Sheet for  Healthcare Providers: https://www.woods-mathews.com/  This test is not yet approved or cleared by the [place] FDA and  has been authorized for detection and/or diagnosis of SARS-CoV-2 by FDA under an Emergency Use Authorization (EUA). This EUA will remain  in effect (meaning this test can be used) for the duration of the COVID-19 declaration under Se ction 564(b)(1) of the Act, 21 U.S.C. section 360bbb-3(b)(1), unless the authorization is terminated or revoked sooner.  Performed at [facility], [place] [address], [place] [address]   CBC with Differential/Platelet     Status: Abnormal   Collection Time: 12/17/20  9:39 AM  Result Value Ref Range   WBC 5.8 4.0 - 10.5 K/uL   RBC 4.09 3.87 - 5.11 MIL/uL   Hemoglobin 12.2 12.0 - 15.0 g/dL   HCT 40.8 36.0 - 46.0 %   MCV 99.8 80.0 - 100.0 fL   MCH 29.8 26.0 - 34.0 pg   MCHC 29.9 (L) 30.0 - 36.0 g/dL   RDW 16.5 (H) 11.5 - 15.5 %   Platelets 280 150 - 400 K/uL   nRBC 0.0 0.0 - 0.2 %   Neutrophils Relative % 47 %   Neutro Abs 2.7 1.7 - 7.7 K/uL   Lymphocytes Relative  36 %   Lymphs Abs 2.1 0.7 - 4.0 K/uL   Monocytes Relative 12 %   Monocytes Absolute 0.7 0.1 - 1.0 K/uL   Eosinophils Relative 4 %   Eosinophils Absolute 0.2 0.0 - 0.5 K/uL   Basophils Relative 1 %   Basophils Absolute 0.1 0.0 - 0.1 K/uL   Immature Granulocytes 0 %   Abs Immature Granulocytes 0.01 0.00 - 0.07 K/uL    Comment: Performed at [facility], [address] [place], [place] [address]  Basic metabolic panel     Status: Abnormal   Collection Time: 12/17/20  9:39 AM  Result Value Ref Range   Sodium 141 135 - 145 mmol/L   Potassium 4.5 3.5 - 5.1 mmol/L   Chloride 103 98 - 111 mmol/L   CO2 28 22 - 32 mmol/L   Glucose, Bld 119 (H) 70 - 99 mg/dL    Comment: Glucose reference range applies only to samples taken after fasting for at least 8 hours.   BUN 11 8 - 23 mg/dL   Creatinine, Ser 0.46 0.44 - 1.00 mg/dL   Calcium 8.5 (L) 8.9 -  10.3 mg/dL   GFR, Estimated >60 >60 mL/min    Comment: (NOTE) Calculated using the CKD-EPI Creatinine Equation (2021)    Anion gap 10 5 - 15    Comment: Performed at [facility], [address] [place], [place] [address]   No results found.  Review of Systems  Constitutional: Negative.   HENT: Negative.   Eyes: Negative.   Respiratory: Negative.   Cardiovascular: Negative.   Gastrointestinal: Positive for vomiting.  Endocrine: Negative.   Genitourinary: Negative.   Musculoskeletal: Negative.   Skin: Negative.   Allergic/Immunologic: Negative.   Neurological: Negative.   Hematological: Negative.   Psychiatric/Behavioral: Negative.     Blood pressure 131/69, pulse (!) 46, resp. rate 18, SpO2 94 %. Physical Exam  GENERAL: The patient is AO x3, in no acute distress. Underweight. HEENT: Head is normocephalic and atraumatic. EOMI are intact. Mouth is well hydrated and without lesions. NECK: Supple. No masses LUNGS: Clear to auscultation. No presence of rhonchi/wheezing/rales. Adequate chest expansion HEART: RRR, normal s1 and s2. ABDOMEN: Soft, nontender, no guarding, no peritoneal signs, and nondistended. BS +. No masses. EXTREMITIES: Without any cyanosis, clubbing, rash, lesions or edema. NEUROLOGIC: AOx3, no focal motor deficit. SKIN: no jaundice, no rashes  Assessment/Plan 64 y.o.femaleappendical history of COPD, GERD, peptic ulcer disease complicated by bleeding requiring partial gastrectomy with Billroth I reconstruction complicated by recurrent stricture at the anastomosis,coming to the hospital to undergo endoscopic evaluation and management of recurrent gastroduodenal stricture.  We will proceed with EGD with balloon dilation.  Harvel Quale, MD 12/19/2020, 7:35 AM

## 2020-12-24 ENCOUNTER — Telehealth (INDEPENDENT_AMBULATORY_CARE_PROVIDER_SITE_OTHER): Payer: Self-pay | Admitting: *Deleted

## 2020-12-24 NOTE — Telephone Encounter (Signed)
Per op note Repeat upper endoscopy in 4-6 weeks for retreatment. PLEASE DO NOT EAT SOLID FOOD 3 DAYS PRIOR TO NEXT EGD

## 2020-12-25 ENCOUNTER — Encounter (HOSPITAL_COMMUNITY): Payer: Self-pay | Admitting: Gastroenterology

## 2020-12-30 NOTE — Telephone Encounter (Signed)
noted 

## 2021-01-06 ENCOUNTER — Other Ambulatory Visit (INDEPENDENT_AMBULATORY_CARE_PROVIDER_SITE_OTHER): Payer: Self-pay

## 2021-01-06 DIAGNOSIS — K209 Esophagitis, unspecified without bleeding: Secondary | ICD-10-CM

## 2021-01-07 ENCOUNTER — Encounter (INDEPENDENT_AMBULATORY_CARE_PROVIDER_SITE_OTHER): Payer: Self-pay

## 2021-01-14 DIAGNOSIS — Z681 Body mass index (BMI) 19 or less, adult: Secondary | ICD-10-CM | POA: Diagnosis not present

## 2021-01-14 DIAGNOSIS — Z299 Encounter for prophylactic measures, unspecified: Secondary | ICD-10-CM | POA: Diagnosis not present

## 2021-01-14 DIAGNOSIS — R69 Illness, unspecified: Secondary | ICD-10-CM | POA: Diagnosis not present

## 2021-01-14 DIAGNOSIS — F17210 Nicotine dependence, cigarettes, uncomplicated: Secondary | ICD-10-CM | POA: Diagnosis not present

## 2021-01-14 DIAGNOSIS — E43 Unspecified severe protein-calorie malnutrition: Secondary | ICD-10-CM | POA: Diagnosis not present

## 2021-01-14 DIAGNOSIS — E538 Deficiency of other specified B group vitamins: Secondary | ICD-10-CM | POA: Diagnosis not present

## 2021-01-16 DIAGNOSIS — J449 Chronic obstructive pulmonary disease, unspecified: Secondary | ICD-10-CM | POA: Diagnosis not present

## 2021-01-16 DIAGNOSIS — Z79899 Other long term (current) drug therapy: Secondary | ICD-10-CM | POA: Diagnosis not present

## 2021-01-16 DIAGNOSIS — Z7982 Long term (current) use of aspirin: Secondary | ICD-10-CM | POA: Diagnosis not present

## 2021-01-16 DIAGNOSIS — L720 Epidermal cyst: Secondary | ICD-10-CM | POA: Diagnosis not present

## 2021-01-28 ENCOUNTER — Encounter (HOSPITAL_COMMUNITY): Payer: Medicare HMO

## 2021-01-28 ENCOUNTER — Other Ambulatory Visit (HOSPITAL_COMMUNITY): Payer: Medicare HMO

## 2021-01-30 ENCOUNTER — Ambulatory Visit (HOSPITAL_COMMUNITY): Admit: 2021-01-30 | Payer: Medicare HMO | Admitting: Gastroenterology

## 2021-01-30 ENCOUNTER — Encounter (HOSPITAL_COMMUNITY): Payer: Self-pay

## 2021-01-30 SURGERY — ESOPHAGOGASTRODUODENOSCOPY (EGD) WITH PROPOFOL
Anesthesia: Monitor Anesthesia Care

## 2021-02-02 DIAGNOSIS — L720 Epidermal cyst: Secondary | ICD-10-CM | POA: Diagnosis not present

## 2021-02-11 DIAGNOSIS — J449 Chronic obstructive pulmonary disease, unspecified: Secondary | ICD-10-CM | POA: Diagnosis not present

## 2021-02-11 DIAGNOSIS — M549 Dorsalgia, unspecified: Secondary | ICD-10-CM | POA: Diagnosis not present

## 2021-02-11 DIAGNOSIS — E43 Unspecified severe protein-calorie malnutrition: Secondary | ICD-10-CM | POA: Diagnosis not present

## 2021-02-11 DIAGNOSIS — R69 Illness, unspecified: Secondary | ICD-10-CM | POA: Diagnosis not present

## 2021-02-11 DIAGNOSIS — Z299 Encounter for prophylactic measures, unspecified: Secondary | ICD-10-CM | POA: Diagnosis not present

## 2021-02-11 DIAGNOSIS — F17210 Nicotine dependence, cigarettes, uncomplicated: Secondary | ICD-10-CM | POA: Diagnosis not present

## 2021-02-11 DIAGNOSIS — Z681 Body mass index (BMI) 19 or less, adult: Secondary | ICD-10-CM | POA: Diagnosis not present

## 2021-02-11 DIAGNOSIS — E538 Deficiency of other specified B group vitamins: Secondary | ICD-10-CM | POA: Diagnosis not present

## 2021-03-18 DIAGNOSIS — Z299 Encounter for prophylactic measures, unspecified: Secondary | ICD-10-CM | POA: Diagnosis not present

## 2021-03-18 DIAGNOSIS — G4700 Insomnia, unspecified: Secondary | ICD-10-CM | POA: Diagnosis not present

## 2021-03-18 DIAGNOSIS — E538 Deficiency of other specified B group vitamins: Secondary | ICD-10-CM | POA: Diagnosis not present

## 2021-03-18 DIAGNOSIS — R69 Illness, unspecified: Secondary | ICD-10-CM | POA: Diagnosis not present

## 2021-03-18 DIAGNOSIS — R5383 Other fatigue: Secondary | ICD-10-CM | POA: Diagnosis not present

## 2021-03-18 DIAGNOSIS — F17210 Nicotine dependence, cigarettes, uncomplicated: Secondary | ICD-10-CM | POA: Diagnosis not present

## 2021-03-18 DIAGNOSIS — Z681 Body mass index (BMI) 19 or less, adult: Secondary | ICD-10-CM | POA: Diagnosis not present

## 2021-03-18 DIAGNOSIS — E43 Unspecified severe protein-calorie malnutrition: Secondary | ICD-10-CM | POA: Diagnosis not present

## 2021-04-22 DIAGNOSIS — R5383 Other fatigue: Secondary | ICD-10-CM | POA: Diagnosis not present

## 2021-04-22 DIAGNOSIS — M810 Age-related osteoporosis without current pathological fracture: Secondary | ICD-10-CM | POA: Diagnosis not present

## 2021-04-22 DIAGNOSIS — E559 Vitamin D deficiency, unspecified: Secondary | ICD-10-CM | POA: Diagnosis not present

## 2021-04-24 DIAGNOSIS — E538 Deficiency of other specified B group vitamins: Secondary | ICD-10-CM | POA: Diagnosis not present

## 2021-04-24 DIAGNOSIS — M25541 Pain in joints of right hand: Secondary | ICD-10-CM | POA: Diagnosis not present

## 2021-04-24 DIAGNOSIS — M25542 Pain in joints of left hand: Secondary | ICD-10-CM | POA: Diagnosis not present

## 2021-04-24 DIAGNOSIS — M549 Dorsalgia, unspecified: Secondary | ICD-10-CM | POA: Diagnosis not present

## 2021-04-24 DIAGNOSIS — Z299 Encounter for prophylactic measures, unspecified: Secondary | ICD-10-CM | POA: Diagnosis not present

## 2021-04-24 DIAGNOSIS — Z681 Body mass index (BMI) 19 or less, adult: Secondary | ICD-10-CM | POA: Diagnosis not present

## 2021-05-25 DIAGNOSIS — Z299 Encounter for prophylactic measures, unspecified: Secondary | ICD-10-CM | POA: Diagnosis not present

## 2021-05-25 DIAGNOSIS — Z111 Encounter for screening for respiratory tuberculosis: Secondary | ICD-10-CM | POA: Diagnosis not present

## 2021-05-25 DIAGNOSIS — E538 Deficiency of other specified B group vitamins: Secondary | ICD-10-CM | POA: Diagnosis not present

## 2021-05-25 DIAGNOSIS — F17210 Nicotine dependence, cigarettes, uncomplicated: Secondary | ICD-10-CM | POA: Diagnosis not present

## 2021-05-25 DIAGNOSIS — M25541 Pain in joints of right hand: Secondary | ICD-10-CM | POA: Diagnosis not present

## 2021-05-25 DIAGNOSIS — E876 Hypokalemia: Secondary | ICD-10-CM | POA: Diagnosis not present

## 2021-05-25 DIAGNOSIS — M25542 Pain in joints of left hand: Secondary | ICD-10-CM | POA: Diagnosis not present

## 2021-05-25 DIAGNOSIS — R69 Illness, unspecified: Secondary | ICD-10-CM | POA: Diagnosis not present

## 2021-05-25 DIAGNOSIS — Z681 Body mass index (BMI) 19 or less, adult: Secondary | ICD-10-CM | POA: Diagnosis not present

## 2021-06-18 DIAGNOSIS — E876 Hypokalemia: Secondary | ICD-10-CM | POA: Diagnosis not present

## 2021-06-22 DIAGNOSIS — M06049 Rheumatoid arthritis without rheumatoid factor, unspecified hand: Secondary | ICD-10-CM | POA: Diagnosis not present

## 2021-06-22 DIAGNOSIS — F17210 Nicotine dependence, cigarettes, uncomplicated: Secondary | ICD-10-CM | POA: Diagnosis not present

## 2021-06-22 DIAGNOSIS — Z79899 Other long term (current) drug therapy: Secondary | ICD-10-CM | POA: Diagnosis not present

## 2021-06-22 DIAGNOSIS — M25541 Pain in joints of right hand: Secondary | ICD-10-CM | POA: Diagnosis not present

## 2021-06-22 DIAGNOSIS — E538 Deficiency of other specified B group vitamins: Secondary | ICD-10-CM | POA: Diagnosis not present

## 2021-06-22 DIAGNOSIS — R69 Illness, unspecified: Secondary | ICD-10-CM | POA: Diagnosis not present

## 2021-06-22 DIAGNOSIS — Z681 Body mass index (BMI) 19 or less, adult: Secondary | ICD-10-CM | POA: Diagnosis not present

## 2021-06-22 DIAGNOSIS — M25542 Pain in joints of left hand: Secondary | ICD-10-CM | POA: Diagnosis not present

## 2021-06-22 DIAGNOSIS — Z299 Encounter for prophylactic measures, unspecified: Secondary | ICD-10-CM | POA: Diagnosis not present

## 2021-07-06 DIAGNOSIS — M06049 Rheumatoid arthritis without rheumatoid factor, unspecified hand: Secondary | ICD-10-CM | POA: Diagnosis not present

## 2021-07-20 DIAGNOSIS — M06049 Rheumatoid arthritis without rheumatoid factor, unspecified hand: Secondary | ICD-10-CM | POA: Diagnosis not present

## 2021-08-04 DIAGNOSIS — M06049 Rheumatoid arthritis without rheumatoid factor, unspecified hand: Secondary | ICD-10-CM | POA: Diagnosis not present

## 2021-08-14 DIAGNOSIS — E538 Deficiency of other specified B group vitamins: Secondary | ICD-10-CM | POA: Diagnosis not present

## 2021-08-14 DIAGNOSIS — M06049 Rheumatoid arthritis without rheumatoid factor, unspecified hand: Secondary | ICD-10-CM | POA: Diagnosis not present

## 2021-08-14 DIAGNOSIS — M19042 Primary osteoarthritis, left hand: Secondary | ICD-10-CM | POA: Diagnosis not present

## 2021-08-14 DIAGNOSIS — Z299 Encounter for prophylactic measures, unspecified: Secondary | ICD-10-CM | POA: Diagnosis not present

## 2021-08-14 DIAGNOSIS — M19041 Primary osteoarthritis, right hand: Secondary | ICD-10-CM | POA: Diagnosis not present

## 2021-08-14 DIAGNOSIS — R69 Illness, unspecified: Secondary | ICD-10-CM | POA: Diagnosis not present

## 2021-08-21 DIAGNOSIS — Z0000 Encounter for general adult medical examination without abnormal findings: Secondary | ICD-10-CM | POA: Diagnosis not present

## 2021-08-21 DIAGNOSIS — R5383 Other fatigue: Secondary | ICD-10-CM | POA: Diagnosis not present

## 2021-08-21 DIAGNOSIS — Z1331 Encounter for screening for depression: Secondary | ICD-10-CM | POA: Diagnosis not present

## 2021-08-21 DIAGNOSIS — E538 Deficiency of other specified B group vitamins: Secondary | ICD-10-CM | POA: Diagnosis not present

## 2021-08-21 DIAGNOSIS — Z79899 Other long term (current) drug therapy: Secondary | ICD-10-CM | POA: Diagnosis not present

## 2021-08-21 DIAGNOSIS — Z681 Body mass index (BMI) 19 or less, adult: Secondary | ICD-10-CM | POA: Diagnosis not present

## 2021-08-21 DIAGNOSIS — E7800 Pure hypercholesterolemia, unspecified: Secondary | ICD-10-CM | POA: Diagnosis not present

## 2021-08-21 DIAGNOSIS — Z1339 Encounter for screening examination for other mental health and behavioral disorders: Secondary | ICD-10-CM | POA: Diagnosis not present

## 2021-08-21 DIAGNOSIS — Z299 Encounter for prophylactic measures, unspecified: Secondary | ICD-10-CM | POA: Diagnosis not present

## 2021-08-21 DIAGNOSIS — Z23 Encounter for immunization: Secondary | ICD-10-CM | POA: Diagnosis not present

## 2021-08-21 DIAGNOSIS — Z7189 Other specified counseling: Secondary | ICD-10-CM | POA: Diagnosis not present

## 2021-08-24 DIAGNOSIS — R010 Benign and innocent cardiac murmurs: Secondary | ICD-10-CM | POA: Diagnosis not present

## 2021-08-31 DIAGNOSIS — R1110 Vomiting, unspecified: Secondary | ICD-10-CM | POA: Diagnosis not present

## 2021-08-31 DIAGNOSIS — J449 Chronic obstructive pulmonary disease, unspecified: Secondary | ICD-10-CM | POA: Diagnosis not present

## 2021-08-31 DIAGNOSIS — E43 Unspecified severe protein-calorie malnutrition: Secondary | ICD-10-CM | POA: Diagnosis not present

## 2021-08-31 DIAGNOSIS — K5730 Diverticulosis of large intestine without perforation or abscess without bleeding: Secondary | ICD-10-CM | POA: Diagnosis not present

## 2021-08-31 DIAGNOSIS — I700 Atherosclerosis of aorta: Secondary | ICD-10-CM | POA: Diagnosis not present

## 2021-08-31 DIAGNOSIS — Z681 Body mass index (BMI) 19 or less, adult: Secondary | ICD-10-CM | POA: Diagnosis not present

## 2021-08-31 DIAGNOSIS — Z20822 Contact with and (suspected) exposure to covid-19: Secondary | ICD-10-CM | POA: Diagnosis not present

## 2021-08-31 DIAGNOSIS — J019 Acute sinusitis, unspecified: Secondary | ICD-10-CM | POA: Diagnosis not present

## 2021-08-31 DIAGNOSIS — I2510 Atherosclerotic heart disease of native coronary artery without angina pectoris: Secondary | ICD-10-CM | POA: Diagnosis not present

## 2021-08-31 DIAGNOSIS — E876 Hypokalemia: Secondary | ICD-10-CM | POA: Diagnosis not present

## 2021-08-31 DIAGNOSIS — Z7982 Long term (current) use of aspirin: Secondary | ICD-10-CM | POA: Diagnosis not present

## 2021-08-31 DIAGNOSIS — S22080A Wedge compression fracture of T11-T12 vertebra, initial encounter for closed fracture: Secondary | ICD-10-CM | POA: Diagnosis not present

## 2021-08-31 DIAGNOSIS — R531 Weakness: Secondary | ICD-10-CM | POA: Diagnosis not present

## 2021-08-31 DIAGNOSIS — R69 Illness, unspecified: Secondary | ICD-10-CM | POA: Diagnosis not present

## 2021-08-31 DIAGNOSIS — R112 Nausea with vomiting, unspecified: Secondary | ICD-10-CM | POA: Diagnosis not present

## 2021-08-31 DIAGNOSIS — K6389 Other specified diseases of intestine: Secondary | ICD-10-CM | POA: Diagnosis not present

## 2021-08-31 DIAGNOSIS — R4182 Altered mental status, unspecified: Secondary | ICD-10-CM | POA: Diagnosis not present

## 2021-08-31 DIAGNOSIS — J984 Other disorders of lung: Secondary | ICD-10-CM | POA: Diagnosis not present

## 2021-09-01 DIAGNOSIS — J019 Acute sinusitis, unspecified: Secondary | ICD-10-CM | POA: Diagnosis not present

## 2021-09-01 DIAGNOSIS — R69 Illness, unspecified: Secondary | ICD-10-CM | POA: Diagnosis not present

## 2021-09-01 DIAGNOSIS — E43 Unspecified severe protein-calorie malnutrition: Secondary | ICD-10-CM | POA: Diagnosis not present

## 2021-09-01 DIAGNOSIS — E876 Hypokalemia: Secondary | ICD-10-CM | POA: Diagnosis not present

## 2021-09-09 DIAGNOSIS — R0602 Shortness of breath: Secondary | ICD-10-CM | POA: Diagnosis not present

## 2021-09-09 DIAGNOSIS — R609 Edema, unspecified: Secondary | ICD-10-CM | POA: Diagnosis not present

## 2021-09-09 DIAGNOSIS — F1020 Alcohol dependence, uncomplicated: Secondary | ICD-10-CM | POA: Diagnosis not present

## 2021-09-09 DIAGNOSIS — J449 Chronic obstructive pulmonary disease, unspecified: Secondary | ICD-10-CM | POA: Diagnosis not present

## 2021-09-09 DIAGNOSIS — Z299 Encounter for prophylactic measures, unspecified: Secondary | ICD-10-CM | POA: Diagnosis not present

## 2021-09-09 DIAGNOSIS — Z681 Body mass index (BMI) 19 or less, adult: Secondary | ICD-10-CM | POA: Diagnosis not present

## 2021-09-09 DIAGNOSIS — F17210 Nicotine dependence, cigarettes, uncomplicated: Secondary | ICD-10-CM | POA: Diagnosis not present

## 2021-09-09 DIAGNOSIS — R69 Illness, unspecified: Secondary | ICD-10-CM | POA: Diagnosis not present

## 2021-09-18 DIAGNOSIS — J019 Acute sinusitis, unspecified: Secondary | ICD-10-CM | POA: Diagnosis not present

## 2021-09-18 DIAGNOSIS — F1020 Alcohol dependence, uncomplicated: Secondary | ICD-10-CM | POA: Diagnosis not present

## 2021-09-18 DIAGNOSIS — F17210 Nicotine dependence, cigarettes, uncomplicated: Secondary | ICD-10-CM | POA: Diagnosis not present

## 2021-09-18 DIAGNOSIS — Z299 Encounter for prophylactic measures, unspecified: Secondary | ICD-10-CM | POA: Diagnosis not present

## 2021-09-18 DIAGNOSIS — J449 Chronic obstructive pulmonary disease, unspecified: Secondary | ICD-10-CM | POA: Diagnosis not present

## 2021-09-18 DIAGNOSIS — R69 Illness, unspecified: Secondary | ICD-10-CM | POA: Diagnosis not present

## 2021-09-18 DIAGNOSIS — Z681 Body mass index (BMI) 19 or less, adult: Secondary | ICD-10-CM | POA: Diagnosis not present

## 2021-12-01 DIAGNOSIS — Z0100 Encounter for examination of eyes and vision without abnormal findings: Secondary | ICD-10-CM | POA: Diagnosis not present

## 2021-12-01 DIAGNOSIS — H5200 Hypermetropia, unspecified eye: Secondary | ICD-10-CM | POA: Diagnosis not present

## 2021-12-01 DIAGNOSIS — H25813 Combined forms of age-related cataract, bilateral: Secondary | ICD-10-CM | POA: Diagnosis not present

## 2021-12-04 DIAGNOSIS — B9689 Other specified bacterial agents as the cause of diseases classified elsewhere: Secondary | ICD-10-CM | POA: Diagnosis not present

## 2021-12-04 DIAGNOSIS — J329 Chronic sinusitis, unspecified: Secondary | ICD-10-CM | POA: Diagnosis not present

## 2021-12-11 ENCOUNTER — Inpatient Hospital Stay (HOSPITAL_COMMUNITY)
Admission: EM | Admit: 2021-12-11 | Discharge: 2021-12-14 | DRG: 536 | Disposition: A | Discharge status: Skilled Nursing Facility | Payer: Medicare HMO | Attending: Internal Medicine | Admitting: Internal Medicine

## 2021-12-11 ENCOUNTER — Emergency Department (HOSPITAL_COMMUNITY): Payer: Medicare HMO

## 2021-12-11 ENCOUNTER — Encounter (HOSPITAL_COMMUNITY): Payer: Self-pay | Admitting: *Deleted

## 2021-12-11 DIAGNOSIS — Z803 Family history of malignant neoplasm of breast: Secondary | ICD-10-CM

## 2021-12-11 DIAGNOSIS — Z20822 Contact with and (suspected) exposure to covid-19: Secondary | ICD-10-CM | POA: Diagnosis present

## 2021-12-11 DIAGNOSIS — L89892 Pressure ulcer of other site, stage 2: Secondary | ICD-10-CM | POA: Diagnosis present

## 2021-12-11 DIAGNOSIS — L89151 Pressure ulcer of sacral region, stage 1: Secondary | ICD-10-CM | POA: Diagnosis present

## 2021-12-11 DIAGNOSIS — S32591A Other specified fracture of right pubis, initial encounter for closed fracture: Principal | ICD-10-CM | POA: Diagnosis present

## 2021-12-11 DIAGNOSIS — J449 Chronic obstructive pulmonary disease, unspecified: Secondary | ICD-10-CM | POA: Diagnosis present

## 2021-12-11 DIAGNOSIS — Z808 Family history of malignant neoplasm of other organs or systems: Secondary | ICD-10-CM

## 2021-12-11 DIAGNOSIS — M810 Age-related osteoporosis without current pathological fracture: Secondary | ICD-10-CM | POA: Diagnosis present

## 2021-12-11 DIAGNOSIS — M533 Sacrococcygeal disorders, not elsewhere classified: Secondary | ICD-10-CM | POA: Diagnosis not present

## 2021-12-11 DIAGNOSIS — W010XXA Fall on same level from slipping, tripping and stumbling without subsequent striking against object, initial encounter: Secondary | ICD-10-CM | POA: Diagnosis present

## 2021-12-11 DIAGNOSIS — K219 Gastro-esophageal reflux disease without esophagitis: Secondary | ICD-10-CM | POA: Diagnosis present

## 2021-12-11 DIAGNOSIS — Z8711 Personal history of peptic ulcer disease: Secondary | ICD-10-CM

## 2021-12-11 DIAGNOSIS — R339 Retention of urine, unspecified: Secondary | ICD-10-CM | POA: Diagnosis present

## 2021-12-11 DIAGNOSIS — F17210 Nicotine dependence, cigarettes, uncomplicated: Secondary | ICD-10-CM | POA: Diagnosis present

## 2021-12-11 DIAGNOSIS — Z79899 Other long term (current) drug therapy: Secondary | ICD-10-CM

## 2021-12-11 DIAGNOSIS — Y92512 Supermarket, store or market as the place of occurrence of the external cause: Secondary | ICD-10-CM

## 2021-12-11 DIAGNOSIS — Z96643 Presence of artificial hip joint, bilateral: Secondary | ICD-10-CM | POA: Diagnosis present

## 2021-12-11 LAB — CBC
HCT: 39.9 % (ref 36.0–46.0)
Hemoglobin: 12.8 g/dL (ref 12.0–15.0)
MCH: 31.2 pg (ref 26.0–34.0)
MCHC: 32.1 g/dL (ref 30.0–36.0)
MCV: 97.3 fL (ref 80.0–100.0)
Platelets: 385 10*3/uL (ref 150–400)
RBC: 4.1 MIL/uL (ref 3.87–5.11)
RDW: 15.4 % (ref 11.5–15.5)
WBC: 12.7 10*3/uL — ABNORMAL HIGH (ref 4.0–10.5)
nRBC: 0 % (ref 0.0–0.2)

## 2021-12-11 LAB — BASIC METABOLIC PANEL
Anion gap: 12 (ref 5–15)
BUN: 23 mg/dL (ref 8–23)
CO2: 21 mmol/L — ABNORMAL LOW (ref 22–32)
Calcium: 8.2 mg/dL — ABNORMAL LOW (ref 8.9–10.3)
Chloride: 104 mmol/L (ref 98–111)
Creatinine, Ser: 0.68 mg/dL (ref 0.44–1.00)
GFR, Estimated: >60 mL/min (ref >60)
Glucose, Bld: 103 mg/dL — ABNORMAL HIGH (ref 70–99)
Potassium: 3.6 mmol/L (ref 3.5–5.1)
Sodium: 137 mmol/L (ref 135–145)

## 2021-12-11 LAB — HIV ANTIBODY (ROUTINE TESTING W REFLEX): HIV Screen 4th Generation wRfx: NONREACTIVE

## 2021-12-11 LAB — RESP PANEL BY RT-PCR (FLU A&B, COVID) ARPGX2
Influenza A by PCR: NEGATIVE
Influenza B by PCR: NEGATIVE
SARS Coronavirus 2 by RT PCR: NEGATIVE

## 2021-12-11 MED ORDER — MORPHINE SULFATE (PF) 2 MG/ML IV SOLN
2.0000 mg | INTRAVENOUS | Status: DC | PRN
  Administered 2021-12-12 – 2021-12-14 (×6): 2 mg via INTRAVENOUS
  Filled 2021-12-11 (×6): qty 1

## 2021-12-11 MED ORDER — ENOXAPARIN SODIUM 40 MG/0.4ML IJ SOSY
40.0000 mg | PREFILLED_SYRINGE | INTRAMUSCULAR | Status: DC
  Administered 2021-12-11 – 2021-12-13 (×3): 40 mg via SUBCUTANEOUS
  Filled 2021-12-11 (×3): qty 0.4

## 2021-12-11 MED ORDER — ACETAMINOPHEN 325 MG PO TABS: 650.0000 mg | ORAL_TABLET | Freq: Four times a day (QID) | ORAL | Status: DC | PRN

## 2021-12-11 MED ORDER — OXYCODONE-ACETAMINOPHEN 5-325 MG PO TABS
1.0000 | ORAL_TABLET | Freq: Once | ORAL | Status: AC
  Administered 2021-12-11: 1 via ORAL
  Filled 2021-12-11: qty 1

## 2021-12-11 MED ORDER — OXYCODONE HCL 5 MG PO TABS
5.0000 mg | ORAL_TABLET | Freq: Four times a day (QID) | ORAL | Status: DC | PRN
  Administered 2021-12-12 – 2021-12-14 (×6): 5 mg via ORAL
  Filled 2021-12-11 (×7): qty 1

## 2021-12-11 MED ORDER — POLYETHYLENE GLYCOL 3350 17 G PO PACK
17.0000 g | PACK | Freq: Every day | ORAL | Status: DC | PRN
Start: 2021-12-11 — End: 2021-12-14
  Administered 2021-12-14: 17 g via ORAL
  Filled 2021-12-11: qty 1

## 2021-12-11 MED ORDER — ACETAMINOPHEN 650 MG RE SUPP: 650.0000 mg | Freq: Four times a day (QID) | RECTAL | Status: DC | PRN

## 2021-12-11 NOTE — ED Notes (Signed)
Attempted to call nursing report to floor 

## 2021-12-11 NOTE — ED Provider Notes (Signed)
[facility] Provider Note   CSN: [id number] Arrival date & time: 12/11/21  1151     History  Chief Complaint  Patient presents with   Lytle Michaels    Kristine Roberts is a 66 y.o. female.  HPI     Home Medications Prior to Admission medications   Medication Sig Start Date End Date Taking? Authorizing Provider  baclofen (LIORESAL) 10 MG tablet Take 10 mg by mouth at bedtime.    [provider]  Calcium-Vitamin D (CALTRATE 600 PLUS-VIT D PO) Take 1 tablet by mouth daily.    [provider]  Cyanocobalamin 1000 MCG/ML KIT Inject 1,000 mcg as directed every 30 (thirty) days.    [provider]  dexlansoprazole (DEXILANT) 60 MG capsule Take 1 capsule (60 mg total) by mouth daily. 12/19/20   Harvel Quale, MD  EPINEPHrine (PRIMATENE MIST) 0.125 MG/ACT AERO Inhale 1-2 puffs into the lungs daily as needed (shortness of breath).    [provider]  famotidine (PEPCID) 20 MG tablet Take 20 mg by mouth daily.    [provider]  Multiple Vitamin (MULTIVITAMIN WITH MINERALS) TABS tablet Take 1 tablet by mouth daily. Multivitamin for Women 50+    [provider]  Potassium 99 MG TABS Take 99 mg by mouth daily.    [provider]  Tetrahydrozoline HCl (VISINE OP) Place 1 drop into both eyes every other day.     [provider]  traZODone (DESYREL) 100 MG tablet Take 200 mg by mouth at bedtime.     [provider]  vitamin B-12 (CYANOCOBALAMIN) 1000 MCG tablet Take 1,000 mcg by mouth daily.    [provider]      Allergies    Penicillins    Review of Systems   Review of Systems  Physical Exam Updated Vital Signs BP 123/81    Pulse (!) 50    Temp 97.7 F (36.5 C)    Resp 16    SpO2 100%  Physical Exam  ED Results / Procedures / Treatments   Labs (all labs ordered are listed, but only abnormal results are displayed) Labs Reviewed  BASIC METABOLIC PANEL - Abnormal;  Notable for the following components:      Result Value   CO2 21 (*)    Glucose, Bld 103 (*)    Calcium 8.2 (*)    All other components within normal limits  CBC - Abnormal; Notable for the following components:   WBC 12.7 (*)    All other components within normal limits  RESP PANEL BY RT-PCR (FLU A&B, COVID) ARPGX2    EKG None  Radiology DG Sacrum/Coccyx  Result Date: 12/11/2021 CLINICAL DATA:  Sacrococcygeal pain after a fall today. EXAM: SACRUM AND COCCYX - 2+ VIEW COMPARISON:  CT abdomen and pelvis 08/31/2021 FINDINGS: There are minimally displaced right superior and inferior pubic rami fractures. No acute displaced sacrococcygeal fracture is identified, with assessment limited by osteopenia and overlying stool. Bilateral proximal femoral ORIF is noted. Sequelae of prior bowel surgery are noted. IMPRESSION: Acute right superior and inferior pubic rami fractures. Electronically Signed   By: Logan Bores M.D.   On: 12/11/2021 12:50    Procedures Procedures    Medications Ordered in ED Medications  oxyCODONE-acetaminophen (PERCOCET/ROXICET) 5-325 MG per tablet 1 tablet (1 tablet Oral Given 12/11/21 1352)    ED Course/ Medical Decision Making/ A&P Clinical Course as of 12/11/21 1516  Fri Dec 11, 2021  1425 Admitted to hospitalist [  MT]    Clinical Course User Index [MT] Wyvonnia Dusky, MD   This is a 53-year female with a history of osteopenia, bilateral hip fracture status post ORIF, presenting from home with mechanical fall.  Patient reports that she lost her balance today and fell onto her tailbone while leaving a grocery store.  She has not been able to bear weight since then.  Her pain is significant, worse with movement.  It is located in the right posterior hip primarily and near her tailbone.  She lives with her husband at home.  She is not on blood thinners.  She denies any other injuries  On exam she is mildly hypertensive, otherwise unremarkable vital signs.  She is  neurovascularly intact.  She does have good range of motion of the hips.  She does have some pelvic tenderness on the right side.  This patient presents to the ED with concern for right pelvic pain after fall. This involves an extensive number of treatment options, and is a complaint that carries with it a high risk of complications and morbidity.  The differential diagnosis includes hip fx vs pelvic fx vs other   I ordered and personally interpreted labs.  The pertinent results include:  unremarkable BMP, CBC  I ordered imaging studies including dg hip/cocysc I independently visualized and interpreted imaging which showed right superior/inferior pubic rami fx I agree with the radiologist interpretation   I ordered medication including percocet for pain Reevaluation of the patient after these medicines showed that the patient stayed the same I have reviewed the patients home medicines and have made adjustments as needed  After the interventions noted above, I reevaluated the patient and found that they have :stayed the same      Dispostion:  After consideration of the diagnostic results and the patients response to treatment, I feel that the patent would benefit from observation hospitalization, pain control, PT eval, SNF placement  I spoke to Dr Aline Brochure orthopedics who advises for these pubic rami fx patient can be WBAT, recommending pain control, PT eval, and placement as needed.  No emergent operation indicated.  Clinical Course as of 12/11/21 1516  Fri Dec 11, 2021  1425 Admitted to hospitalist [MT]    Clinical Course User Index [MT] Merlie Noga, Carola Rhine, MD                            Medical Decision Making         Final Clinical Impression(s) / ED Diagnoses Final diagnoses:  Closed fracture of multiple rami of right pubis, initial encounter [facility])    Rx / DC Orders ED Discharge Orders     None         Wyvonnia Dusky, MD 12/11/21 1516

## 2021-12-11 NOTE — ED Triage Notes (Signed)
Fell today, pain in tailbone region

## 2021-12-11 NOTE — H&P (Signed)
History and Physical:    Kristine Roberts   DGU:[id number] DOB: 17-Dec-1955 DOA: 12/11/2021  Referring MD/provider: Octaviano Glow, MD PCP: Monico Blitz, MD   Patient coming from: Home  Chief Complaint: Lower back pain  History of Present Illness:   Kristine Roberts is a 66 y.o. female with medical history significant for COPD, dysphagia, dysrhythmia, gastric outlet obstruction, osteoporosis, hiatal hernia, peptic ulcer disease, bilateral hip fracture s/p ORIF, who presented to the hospital with low back pain after a mechanical fall at home.  She said she just got new glasses and she is not used to wearing them.  This caused her to lose her balance and fall.  She said her gait is usually unsteady anyway because of previous hip problems.  Pain is severe and there are no known relieving factors.  It is worse with movement.  It is sharp in nature.  No vomiting, diarrhea, abdominal pain, dizziness, lightheadedness, palpitations, chest pain, shortness of breath or syncope.  ED Course:  The patient was given Percocet for pain  ROS:   ROS all other systems reviewed were negative  Past Medical History:   Past Medical History:  Diagnosis Date   COPD (chronic obstructive pulmonary disease) (HCC)    Dysphagia    Dysrhythmia    Esophagitis    Gastric outlet obstruction    Gastric stenosis    GERD (gastroesophageal reflux disease)    Hiatal hernia    Osteoporosis    PUD (peptic ulcer disease)    Wounds, multiple     Past Surgical History:   Past Surgical History:  Procedure Laterality Date   APPENDECTOMY     BALLOON DILATION N/A 03/05/2016   Procedure: BALLOON DILATION;  Surgeon: Rogene Houston, MD;  [facility];  Service: Endoscopy;  Laterality: N/A;  pyloric channel dialtaion   BALLOON DILATION N/A 07/14/2018   Procedure: BALLOON DILATION;  Surgeon: Jackquline Denmark, MD;  Location: [facility]COPY;  Service: Endoscopy;  Laterality: N/A;   BALLOON DILATION  10/03/2020    Procedure: BALLOON DILATION;  Surgeon: Montez Morita, Quillian Quince, MD;  [facility];  Service: Gastroenterology;;   Larrie Kass DILATION  12/19/2020   Procedure: [place];  Surgeon: Montez Morita, Quillian Quince, MD;  [facility];  Service: Gastroenterology;;   BIOPSY  07/14/2018   Procedure: BIOPSY;  Surgeon: Jackquline Denmark, MD;  Location: [facility] ENDOSCOPY;  Service: Endoscopy;;   BIOPSY  07/03/2020   Procedure: BIOPSY;  Surgeon: Lavena Bullion, DO;  Location: [place] ENDOSCOPY;  Service: Gastroenterology;;   CHOLECYSTECTOMY     COLONOSCOPY WITH PROPOFOL N/A 08/01/2020   Procedure: COLONOSCOPY WITH PROPOFOL;  Surgeon: Harvel Quale, MD;  [facility];  Service: Gastroenterology;  Laterality: N/A;  1130   ESOPHAGEAL DILATION N/A 03/03/2016   Procedure: ESOPHAGEAL DILATION;  Surgeon: Rogene Houston, MD;  [facility];  Service: Endoscopy;  Laterality: N/A;   ESOPHAGEAL DILATION N/A 05/07/2016   Procedure: ESOPHAGEAL DILATION;  Surgeon: Rogene Houston, MD;  [facility];  Service: Endoscopy;  Laterality: N/A;   ESOPHAGEAL DILATION  01/26/2018   Procedure: DILATION OF ANASTOMOTIC  STRICTURE;  Surgeon: Rogene Houston, MD;  [facility];  Service: Endoscopy;;   ESOPHAGEAL DILATION N/A 08/01/2020   Procedure: ESOPHAGEAL DILATION;  Surgeon: Harvel Quale, MD;  [facility];  Service: Gastroenterology;  Laterality: N/A;   ESOPHAGOGASTRODUODENOSCOPY N/A 03/03/2016   Procedure: ESOPHAGOGASTRODUODENOSCOPY (EGD);  Surgeon: Rogene Houston,  MD;  [facility];  Service: Endoscopy;  Laterality: N/A;  2:00   ESOPHAGOGASTRODUODENOSCOPY N/A 05/07/2016   Procedure: ESOPHAGOGASTRODUODENOSCOPY (EGD);  Surgeon: Rogene Houston, MD;  [facility];  Service: Endoscopy;  Laterality: N/A;  855 - moved to 6/2 @ 10:15 - Ann notified pt   ESOPHAGOGASTRODUODENOSCOPY N/A 12/28/2017   Procedure:  ESOPHAGOGASTRODUODENOSCOPY (EGD) with stricture dilation;  Surgeon: Rogene Houston, MD;  [facility];  Service: Endoscopy;  Laterality: N/A;   ESOPHAGOGASTRODUODENOSCOPY N/A 01/26/2018   Procedure: ESOPHAGOGASTRODUODENOSCOPY (EGD);  Surgeon: Rogene Houston, MD;  [facility];  Service: Endoscopy;  Laterality: N/A;  255   ESOPHAGOGASTRODUODENOSCOPY N/A 07/14/2018   Procedure: ESOPHAGOGASTRODUODENOSCOPY (EGD);  Surgeon: Jackquline Denmark, MD;  Location[facility] ENDOSCOPY;  Service: Endoscopy;  Laterality: N/A;   ESOPHAGOGASTRODUODENOSCOPY (EGD) WITH PROPOFOL N/A 03/05/2016   Procedure: ESOPHAGOGASTRODUODENOSCOPY (EGD) WITH PROPOFOL Anastomotic stricture dilation ;  Surgeon: Rogene Houston, MD;  [facility];  Service: Endoscopy;  Laterality: N/A;  to be done in OR under fluoro   ESOPHAGOGASTRODUODENOSCOPY (EGD) WITH PROPOFOL N/A 08/22/2019   Procedure: ESOPHAGOGASTRODUODENOSCOPY (EGD) WITH PROPOFOL With anastomotic stricture dilation.;  Surgeon: Rogene Houston, MD;  [facility];  Service: Endoscopy;  Laterality: N/A;   ESOPHAGOGASTRODUODENOSCOPY (EGD) WITH PROPOFOL N/A 08/24/2019   Procedure: ESOPHAGOGASTRODUODENOSCOPY (EGD) WITH PROPOFOL with stricture dilation;  Surgeon: Rogene Houston, MD;  [facility];  Service: Endoscopy;  Laterality: N/A;   ESOPHAGOGASTRODUODENOSCOPY (EGD) WITH PROPOFOL N/A 07/03/2020   Procedure: ESOPHAGOGASTRODUODENOSCOPY (EGD) WITH PROPOFOL;  Surgeon: Lavena Bullion, DO;  Location: [place];  Service: Gastroenterology;  Laterality: N/A;   ESOPHAGOGASTRODUODENOSCOPY (EGD) WITH PROPOFOL N/A 08/01/2020   Procedure: ESOPHAGOGASTRODUODENOSCOPY (EGD) WITH PROPOFOL;  Surgeon: Harvel Quale, MD;  [facility];  Service: Gastroenterology;  Laterality: N/A;   ESOPHAGOGASTRODUODENOSCOPY (EGD) WITH PROPOFOL N/A 08/29/2020   Procedure: ESOPHAGOGASTRODUODENOSCOPY (EGD) WITH PROPOFOL;  Surgeon: Harvel Quale, MD;  [facility];  Service: Gastroenterology;  Laterality: N/A;  10:00   ESOPHAGOGASTRODUODENOSCOPY (EGD) WITH PROPOFOL N/A 10/03/2020   Procedure: ESOPHAGOGASTRODUODENOSCOPY (EGD) WITH PROPOFOL;  Surgeon: Harvel Quale, MD;  [facility];  Service: Gastroenterology;  Laterality: N/A;  200   ESOPHAGOGASTRODUODENOSCOPY (EGD) WITH PROPOFOL N/A 12/19/2020   Procedure: ESOPHAGOGASTRODUODENOSCOPY (EGD) WITH PROPOFOL;  Surgeon: Harvel Quale, MD;  [facility];  Service: Gastroenterology;  Laterality: N/A;  [place] BODY REMOVAL  07/03/2020   Procedure: FOREIGN BODY REMOVAL;  Surgeon: Lavena Bullion, DO;  Location: [place] ENDOSCOPY;  Service: Gastroenterology;;   HIP SURGERY Right 09/2020   HIP SURGERY Left 07/2020   INTRAMEDULLARY (IM) NAIL INTERTROCHANTERIC Right 07/09/2018   Procedure: INTRAMEDULLARY (IM) NAIL INTERTROCHANTRIC;  Surgeon: Nicholes Stairs, MD;  Location: [facility];  Service: Orthopedics;  Laterality: Right;   INTRAMEDULLARY (IM) NAIL INTERTROCHANTERIC Left 06/27/2020   Procedure: INTRAMEDULLARY (IM) NAIL INTERTROCHANTRIC;  Surgeon: Nicholes Stairs, MD;  Location: [facility];  Service: Orthopedics;  Laterality: Left;   KENALOG INJECTION  07/03/2020   Procedure: KENALOG INJECTION;  Surgeon: Lavena Bullion, DO;  Location: [facility];  Service: Gastroenterology;;   STOMACH SURGERY     TOTAL HIP ARTHROPLASTY Bilateral     Social History:   Social History   Socioeconomic History   Marital status: Widowed    Spouse name: Not on file   Number of children: Not on file   Years of education: Not on file  Highest education level: Not on file  Occupational History   Not on file  Tobacco Use   Smoking status: Every Day    Packs/day: 1.50    Years: 20.00    Pack years: 30.00    Types: Cigarettes   Smokeless tobacco: Never  Vaping Use   Vaping Use: Never used  Substance and Sexual Activity   Alcohol use:  Not Currently    Alcohol/week: 12.0 standard drinks    Types: 12 Cans of beer per week    Comment: weekly   Drug use: No   Sexual activity: Yes    Birth control/protection: None  Other Topics Concern   Not on file  Social History Narrative   Not on file   Social Determinants of Health   Financial Resource Strain: Not on file  Food Insecurity: Not on file  Transportation Needs: Not on file  Physical Activity: Not on file  Stress: Not on file  Social Connections: Not on file  Intimate Partner Violence: Not on file    Allergies   Penicillins  Family history:   Family History  Problem Relation Age of Onset   Alzheimer's disease Mother    COPD Mother    Throat cancer Father    Breast cancer Sister     Current Medications:   Prior to Admission medications   Medication Sig Start Date End Date Taking? Authorizing Provider  baclofen (LIORESAL) 10 MG tablet Take 10 mg by mouth at bedtime.    [provider]  Calcium-Vitamin D (CALTRATE 600 PLUS-VIT D PO) Take 1 tablet by mouth daily.    [provider]  Cyanocobalamin 1000 MCG/ML KIT Inject 1,000 mcg as directed every 30 (thirty) days.    [provider]  dexlansoprazole (DEXILANT) 60 MG capsule Take 1 capsule (60 mg total) by mouth daily. 12/19/20   Harvel Quale, MD  EPINEPHrine (PRIMATENE MIST) 0.125 MG/ACT AERO Inhale 1-2 puffs into the lungs daily as needed (shortness of breath).    [provider]  famotidine (PEPCID) 20 MG tablet Take 20 mg by mouth daily.    [provider]  Multiple Vitamin (MULTIVITAMIN WITH MINERALS) TABS tablet Take 1 tablet by mouth daily. Multivitamin for Women 50+    [provider]  Potassium 99 MG TABS Take 99 mg by mouth daily.    [provider]  Tetrahydrozoline HCl (VISINE OP) Place 1 drop into both eyes every other day.     [provider]  traZODone (DESYREL) 100 MG tablet Take 200 mg by mouth at bedtime.      [provider]  vitamin B-12 (CYANOCOBALAMIN) 1000 MCG tablet Take 1,000 mcg by mouth daily.    [provider]    Physical Exam:   Vitals:   12/11/21 1220 12/11/21 1336 12/11/21 1338  BP: (!) 158/72 123/81 123/81  Pulse: (!) 52  (!) 50  Resp: 16  16  Temp: 97.7 F (36.5 C)    SpO2: 100%  100%     Physical Exam: Blood pressure 123/81, pulse (!) 50, temperature 97.7 F (36.5 C), resp. rate 16, SpO2 100 %. Gen: No acute distress, cachectic. Head: Normocephalic, atraumatic. Eyes: Pupils equal, round and reactive to light. Extraocular movements intact.  Sclerae nonicteric.  Mouth: Moist mucous membranes Neck: Supple, no thyromegaly, no lymphadenopathy, no jugular venous distention. Chest: Lungs are clear to auscultation with good air movement. No rales, rhonchi or wheezes.  CV: Heart sounds are regular with an S1, S2. No  murmurs, rubs or gallops.  Abdomen: Soft, nontender, scaphoid abdomen with normal active bowel sounds. No palpable masses. Extremities: Extremities are without clubbing, or cyanosis. No edema. Pedal pulses 2+.  Skin: Warm and dry. No rashes.  Poor skin turgor Neuro: Alert and oriented times 3; grossly nonfocal.  Psych: Insight is good and judgment is appropriate. Mood and affect normal. MSK: Tenderness along the right sacral paraspinal area/right lower back   Data Review:    Labs: Basic Metabolic Panel: Recent Labs  Lab 12/11/21 1343  NA 137  K 3.6  CL 104  CO2 21*  GLUCOSE 103*  BUN 23  CREATININE 0.68  CALCIUM 8.2*   Liver Function Tests: No results for input(s): AST, ALT, ALKPHOS, BILITOT, PROT, ALBUMIN in the last 168 hours. No results for input(s): LIPASE, AMYLASE in the last 168 hours. No results for input(s): AMMONIA in the last 168 hours. CBC: Recent Labs  Lab 12/11/21 1343  WBC 12.7*  HGB 12.8  HCT 39.9  MCV 97.3  PLT 385   Cardiac Enzymes: No results for input(s): CKTOTAL, CKMB, CKMBINDEX, TROPONINI in the  last 168 hours.  BNP (last 3 results) No results for input(s): PROBNP in the last 8760 hours. CBG: No results for input(s): GLUCAP in the last 168 hours.  Urinalysis    Component Value Date/Time   COLORURINE YELLOW 08/22/2019 1010   APPEARANCEUR CLEAR 08/22/2019 1010   LABSPEC 1.014 08/22/2019 1010   PHURINE 6.0 08/22/2019 1010   GLUCOSEU NEGATIVE 08/22/2019 1010   HGBUR NEGATIVE 08/22/2019 [place] 08/22/2019 1010   KETONESUR NEGATIVE 08/22/2019 1010   PROTEINUR NEGATIVE 08/22/2019 1010   NITRITE NEGATIVE 08/22/2019 1010   LEUKOCYTESUR TRACE (A) 08/22/2019 1010      Radiographic Studies: DG Sacrum/Coccyx  Result Date: 12/11/2021 CLINICAL DATA:  Sacrococcygeal pain after a fall today. EXAM: SACRUM AND COCCYX - 2+ VIEW COMPARISON:  CT abdomen and pelvis 08/31/2021 FINDINGS: There are minimally displaced right superior and inferior pubic rami fractures. No acute displaced sacrococcygeal fracture is identified, with assessment limited by osteopenia and overlying stool. Bilateral proximal femoral ORIF is noted. Sequelae of prior bowel surgery are noted. IMPRESSION: Acute right superior and inferior pubic rami fractures. Electronically Signed   By: Logan Bores M.D.   On: 12/11/2021 12:50       Assessment/Plan:   Principal Problem:   Closed fracture of multiple pubic rami, right, initial encounter ([place])   Right pubic rami fracture s/p mechanical fall at home: Admit to [place] for observation.  Use IV morphine, oxycodone and Tylenol as needed for pain control.  Consult PT and OT for evaluation.  She lives with her significant other at home but patient is concerned that her significant other cannot take care of her at home.  Consult TOC to assist with placement to SNF. Dr. Langston Masker, ED physician, said he had spoken to Dr. Aline Brochure, orthopedic surgeon about the case.  Dr. Aline Brochure said no surgical intervention is required and patient can be weightbearing as  tolerated.  COPD: Compensated.  Bronchodilators as needed.  Other information:   DVT prophylaxis:   Lovenox  Code Status: Full code. Family Communication: Plan discussed with her significant other at the bedside Disposition Plan: Possible discharge to SNF Consults called: None Admission status: Observation     Ligaya Cormier [facility] Pager: Please check [email or web address]   How to contact the [facility] Attending or Consulting provider [place] or covering provider during after hours [place], for this patient?  Check the care team in [facility] and look for a) attending/consulting [facility] provider listed and b) the [facility] team listed Log into [email or web address] and use 's universal password to access. If you do not have the password, please contact the hospital operator. Locate the [facility] provider you are looking for under [facility] and page to a number that you can be directly reached. If you still have difficulty reaching the provider, please page the [facility] (Director on Call) for the Hospitalists listed on amion for assistance.  12/11/2021, 3:13 PM

## 2021-12-11 NOTE — ED Notes (Signed)
Hospitalist at bedside 

## 2021-12-12 DIAGNOSIS — S32591A Other specified fracture of right pubis, initial encounter for closed fracture: Secondary | ICD-10-CM | POA: Diagnosis not present

## 2021-12-12 LAB — BASIC METABOLIC PANEL
Anion gap: 9 (ref 5–15)
BUN: 20 mg/dL (ref 8–23)
CO2: 24 mmol/L (ref 22–32)
Calcium: 8.2 mg/dL — ABNORMAL LOW (ref 8.9–10.3)
Chloride: 107 mmol/L (ref 98–111)
Creatinine, Ser: 0.71 mg/dL (ref 0.44–1.00)
GFR, Estimated: >60 mL/min (ref >60)
Glucose, Bld: 78 mg/dL (ref 70–99)
Potassium: 3.8 mmol/L (ref 3.5–5.1)
Sodium: 140 mmol/L (ref 135–145)

## 2021-12-12 LAB — CBC
HCT: 36.8 % (ref 36.0–46.0)
Hemoglobin: 11.9 g/dL — ABNORMAL LOW (ref 12.0–15.0)
MCH: 31 pg (ref 26.0–34.0)
MCHC: 32.3 g/dL (ref 30.0–36.0)
MCV: 95.8 fL (ref 80.0–100.0)
Platelets: 331 10*3/uL (ref 150–400)
RBC: 3.84 MIL/uL — ABNORMAL LOW (ref 3.87–5.11)
RDW: 15.8 % — ABNORMAL HIGH (ref 11.5–15.5)
WBC: 6.6 10*3/uL (ref 4.0–10.5)
nRBC: 0 % (ref 0.0–0.2)

## 2021-12-12 LAB — VITAMIN D 25 HYDROXY (VIT D DEFICIENCY, FRACTURES): Vit D, 25-Hydroxy: 33.46 ng/mL (ref 30–100)

## 2021-12-12 LAB — VITAMIN B12: Vitamin B-12: >7500 pg/mL — ABNORMAL HIGH (ref 180–914)

## 2021-12-12 MED ORDER — NICOTINE 21 MG/24HR TD PT24
21.0000 mg | MEDICATED_PATCH | Freq: Every day | TRANSDERMAL | Status: DC
  Administered 2021-12-12 – 2021-12-14 (×3): 21 mg via TRANSDERMAL
  Filled 2021-12-12 (×3): qty 1

## 2021-12-12 MED ORDER — ONDANSETRON HCL 4 MG/2ML IJ SOLN
4.0000 mg | Freq: Four times a day (QID) | INTRAMUSCULAR | Status: DC | PRN
  Administered 2021-12-12: 4 mg via INTRAVENOUS
  Filled 2021-12-12: qty 2

## 2021-12-12 MED ORDER — PANTOPRAZOLE SODIUM 40 MG PO TBEC
40.0000 mg | DELAYED_RELEASE_TABLET | Freq: Every day | ORAL | Status: DC
  Administered 2021-12-12 – 2021-12-14 (×3): 40 mg via ORAL
  Filled 2021-12-12 (×3): qty 1

## 2021-12-12 NOTE — Plan of Care (Signed)
°  Problem: Acute Rehab PT Goals(only PT should resolve) Goal: Pt will Roll Supine to Side Outcome: Progressing Flowsheets (Taken 12/12/2021 1046) Pt will Roll Supine to Side: with min assist Goal: Pt Will Go Supine/Side To Sit Outcome: Progressing Flowsheets (Taken 12/12/2021 1046) Pt will go Supine/Side to Sit: with minimal assist Goal: Pt Will Go Sit To Supine/Side Outcome: Progressing Flowsheets (Taken 12/12/2021 1046) Pt will go Sit to Supine/Side: with minimal assist Goal: Patient Will Transfer Sit To/From Stand Outcome: Progressing Flowsheets (Taken 12/12/2021 1046) Patient will transfer sit to/from stand: with min guard assist Goal: Pt Will Transfer Bed To Chair/Chair To Bed Outcome: Progressing Flowsheets (Taken 12/12/2021 1046) Pt will Transfer Bed to Chair/Chair to Bed:  with min assist  min guard assist Goal: Pt Will Ambulate Outcome: Progressing Flowsheets (Taken 12/12/2021 1046) Pt will Ambulate:  10 feet  with rolling walker Goal: Pt Will Go Up/Down Stairs Outcome: Progressing Flowsheets (Taken 12/12/2021 1046) Pt will Go Up / Down Stairs:  3-5 stairs  with least restrictive assistive device  with rail(s)   Britta Mccreedy D. Hartnett-Rands, MS, PT [facility] PT [facility] [phone] 12/12/2021

## 2021-12-12 NOTE — Progress Notes (Signed)
.    Progress Note    Kristine Roberts  IRC:[id number] DOB: [birth year]  DOA: 12/11/2021 PCP: Kirstie Peri, MD      Brief Narrative:    Medical records reviewed and are as summarized below:  Kristine Roberts is a 66 y.o. female with medical history significant for COPD, dysphagia, dysrhythmia, gastric outlet obstruction, osteoporosis, hiatal hernia, peptic ulcer disease, bilateral hip fracture s/p ORIF, who presented to the hospital with low back pain after a mechanical fall at home.  She said she just got new glasses and she is not used to wearing them.  This caused her to lose her balance and fall.  She said her gait is usually unsteady anyway because of previous hip problems.  She was found to have right pubic rami fracture.  She was admitted for pain control and physical therapy.    Assessment/Plan:   Principal Problem:   Closed fracture of multiple pubic rami, right, initial encounter (HCC)   Right pubic rami fracture, s/p mechanical fall at home: Continue analgesics as needed for pain.  PT recommended home health therapy.  However, patient thinks she will not be safe at home.  She said her significant other has a history of hip surgery and will not be able to take care of her at home.  She is requesting discharge to SNF.  Continue PT.  Follow-up with case manager to assist with disposition.  COPD: Compensated.  Use bronchodilators as needed.  Tobacco use disorder: She has been advised to stop smoking cigarettes.  She requested nicotine patch.  Stage I sacral decubitus ulcer (present on admission): Apply Mepilex border   Diet Order             Diet regular Room service appropriate? Yes; Fluid consistency: Thin  Diet effective now                      Consultants: None  Procedures: None    Medications:    enoxaparin (LOVENOX) injection  40 mg Subcutaneous Q24H   Continuous Infusions:   Anti-infectives (From admission, onward)    None               Family Communication/Anticipated D/C date and plan/Code Status   DVT prophylaxis: enoxaparin (LOVENOX) injection 40 mg Start: 12/11/21 1830     Code Status: Full Code  Family Communication: None Disposition Plan: Plan to discharge to home versus SNF   Status is: Observation  The patient will require care spanning > 2 midnights and should be moved to inpatient because: Unsafe discharge          Subjective:   She complains of pain in the right lower back  Objective:    Vitals:   12/11/21 1643 12/11/21 1704 12/11/21 2125 12/12/21 0428  BP: 135/72 (!) 144/89 129/70 117/67  Pulse: (!) 53 (!) 50 (!) 52 (!) 50  Resp: Temp: (!) 97.5 F (36.4 C) (!) 97.5 F (36.4 C) 97.6 F (36.4 C) 97.7 F (36.5 C)  TempSrc: Oral     SpO2: 100% 100% 99% 100%   No data found.   Intake/Output Summary (Last 24 hours) at 12/12/2021 1138 Last data filed at 12/12/2021 0900 Gross per 24 hour  Intake 800 ml  Output --  Net 800 ml   There were no vitals filed for this visit.  Exam:  GEN: NAD SKIN: Warm and dry.  Stage I sacral decubitus ulcer (present on admission) EYES:  No pallor or icterus ENT: MMM CV: RRR PULM: CTA B ABD: soft, ND, NT, +BS CNS: AAO x 3, non focal EXT: No edema or tenderness MSK: Right lower back tenderness    Pressure Injury 12/26/17 Stage I -  Intact skin with non-blanchable redness of a localized area usually over a bony prominence. (Active)  12/26/17 1506  Location: Sacrum  Location Orientation: Right  Staging: Stage I -  Intact skin with non-blanchable redness of a localized area usually over a bony prominence.  Wound Description (Comments):   Present on Admission: Yes     Pressure Injury 12/26/17 Stage II -  Partial thickness loss of dermis presenting as a shallow open ulcer with a red, pink wound bed without slough. (Active)  12/26/17 1507  Location: Anus  Location Orientation: Right  Staging: Stage II -  Partial  thickness loss of dermis presenting as a shallow open ulcer with a red, pink wound bed without slough.  Wound Description (Comments):   Present on Admission: Yes     Data Reviewed:   I have personally reviewed following labs and imaging studies:  Labs: Labs show the following:   Basic Metabolic Panel: Recent Labs  Lab 12/11/21 1343 12/12/21 0509  NA 137 140  K 3.6 3.8  CL 104 107  CO2 21* 24  GLUCOSE 103* 78  BUN 23 20  CREATININE 0.68 0.71  CALCIUM 8.2* 8.2*   GFR CrCl cannot be calculated (Unknown ideal weight.). Liver Function Tests: No results for input(s): AST, ALT, ALKPHOS, BILITOT, PROT, ALBUMIN in the last 168 hours. No results for input(s): LIPASE, AMYLASE in the last 168 hours. No results for input(s): AMMONIA in the last 168 hours. Coagulation profile No results for input(s): INR, PROTIME in the last 168 hours.  CBC: Recent Labs  Lab 12/11/21 1343 12/12/21 0509  WBC 12.7* 6.6  HGB 12.8 11.9*  HCT 39.9 36.8  MCV 97.3 95.8  PLT 385 331   Cardiac Enzymes: No results for input(s): CKTOTAL, CKMB, CKMBINDEX, TROPONINI in the last 168 hours. BNP (last 3 results) No results for input(s): PROBNP in the last 8760 hours. CBG: No results for input(s): GLUCAP in the last 168 hours. D-Dimer: No results for input(s): DDIMER in the last 72 hours. Hgb A1c: No results for input(s): HGBA1C in the last 72 hours. Lipid Profile: No results for input(s): CHOL, HDL, LDLCALC, TRIG, CHOLHDL, LDLDIRECT in the last 72 hours. Thyroid function studies: No results for input(s): TSH, T4TOTAL, T3FREE, THYROIDAB in the last 72 hours.  Invalid input(s): FREET3 Anemia work up: No results for input(s): VITAMINB12, FOLATE, FERRITIN, TIBC, IRON, RETICCTPCT in the last 72 hours. Sepsis Labs: Recent Labs  Lab 12/11/21 1343 12/12/21 0509  WBC 12.7* 6.6    Microbiology Recent Results (from the past 240 hour(s))  Resp Panel by RT-PCR (Flu A&B, Covid) Nasopharyngeal Swab      Status: None   Collection Time: 12/11/21  2:28 PM   Specimen: Nasopharyngeal Swab; Nasopharyngeal(NP) swabs in vial transport medium  Result Value Ref Range Status   SARS Coronavirus 2 by RT PCR NEGATIVE NEGATIVE Final    Comment: (NOTE) SARS-CoV-2 target nucleic acids are NOT DETECTED.  The SARS-CoV-2 RNA is generally detectable in upper respiratory specimens during the acute phase of infection. The lowest concentration of SARS-CoV-2 viral copies this assay can detect is 138 copies/mL. A negative result does not preclude SARS-Cov-2 infection and should not be used as the sole basis for treatment or other patient management decisions. A  negative result may occur with  improper specimen collection/handling, submission of specimen other than nasopharyngeal swab, presence of viral mutation(s) within the areas targeted by this assay, and inadequate number of viral copies(<138 copies/mL). A negative result must be combined with clinical observations, patient history, and epidemiological information. The expected result is Negative.  Fact Sheet for Patients:  BloggerCourse.comhttps://www.fda.gov/media/152166/download  Fact Sheet for Healthcare Providers:  SeriousBroker.ithttps://www.fda.gov/media/152162/download  This test is no t yet approved or cleared by the [place] FDA and  has been authorized for detection and/or diagnosis of SARS-CoV-2 by FDA under an Emergency Use Authorization (EUA). This EUA will remain  in effect (meaning this test can be used) for the duration of the COVID-19 declaration under Section 564(b)(1) of the Act, 21 U.S.C.section 360bbb-3(b)(1), unless the authorization is terminated  or revoked sooner.       Influenza A by PCR NEGATIVE NEGATIVE Final   Influenza B by PCR NEGATIVE NEGATIVE Final    Comment: (NOTE) The Xpert Xpress SARS-CoV-2/FLU/RSV plus assay is intended as an aid in the diagnosis of influenza from Nasopharyngeal swab specimens and should not be used as a sole basis  for treatment. Nasal washings and aspirates are unacceptable for Xpert Xpress SARS-CoV-2/FLU/RSV testing.  Fact Sheet for Patients: BloggerCourse.comhttps://www.fda.gov/media/152166/download  Fact Sheet for Healthcare Providers: SeriousBroker.ithttps://www.fda.gov/media/152162/download  This test is not yet approved or cleared by the [place] FDA and has been authorized for detection and/or diagnosis of SARS-CoV-2 by FDA under an Emergency Use Authorization (EUA). This EUA will remain in effect (meaning this test can be used) for the duration of the COVID-19 declaration under Section 564(b)(1) of the Act, 21 U.S.C. section 360bbb-3(b)(1), unless the authorization is terminated or revoked.  Performed at [facility], [address] [place], [place] [address]     Procedures and diagnostic studies:  DG Sacrum/Coccyx  Result Date: 12/11/2021 CLINICAL DATA:  Sacrococcygeal pain after a fall today. EXAM: SACRUM AND COCCYX - 2+ VIEW COMPARISON:  CT abdomen and pelvis 08/31/2021 FINDINGS: There are minimally displaced right superior and inferior pubic rami fractures. No acute displaced sacrococcygeal fracture is identified, with assessment limited by osteopenia and overlying stool. Bilateral proximal femoral ORIF is noted. Sequelae of prior bowel surgery are noted. IMPRESSION: Acute right superior and inferior pubic rami fractures. Electronically Signed   By: Sebastian AcheAllen  Grady M.D.   On: 12/11/2021 12:50               LOS: 0 days   Malory Spurr  [facility]   Pager on [email or web address]. If 7PM-7AM, please contact night-coverage at [email or web address]     12/12/2021, 11:38 AM

## 2021-12-12 NOTE — Evaluation (Addendum)
Physical Therapy Evaluation Patient Details Name: Kristine Roberts MRN: [id number] DOB: 01/06/1956 Today's Date: 12/12/2021  History of Present Illness  Kristine Roberts is a 66 y.o. female with medical history significant for COPD, dysphagia, dysrhythmia, gastric outlet obstruction, osteoporosis, hiatal hernia, peptic ulcer disease, bilateral hip fracture s/p ORIF, who presented to the hospital with low back pain after a mechanical fall at home.  She said she just got new glasses and she is not used to wearing them.  This caused her to lose her balance and fall.  She said her gait is usually unsteady anyway because of previous hip problems.  Pain is severe and there are no known relieving factors.  It is worse with movement.  It is sharp in nature.  No vomiting, diarrhea, abdominal pain, dizziness, lightheadedness, palpitations, chest pain, shortness of breath or syncope.   Clinical Impression  Patient agreeable to participating in PT evaluation today. Nursing reported patient was given pain medication at breakfast time. Patient performed relatively well considering her fractured pubic rami. Patient required minimal assistance to min guard and extra time for all mobility. Patient utilized RW for transfers and short distance ambulation limited to 5 feet in total going forward, turning around and then sidestepping to the left up bedside. Patient is limited in lifting her right leg due to pain complaints and at times needs assistance to move right leg during bed mobility and ambulation.  PT and patient discussed ankle pumps, heel slides and hip abduction/adduction exercises to perform in bed. Patient reported she was familiar with these exercises from her previous hip fracture repairs. Patient is currently requiring assistance with all mobility. Patient would continue to benefit from skilled physical therapy in current environment and next venue to continue return to prior function and increase strength,  endurance, balance, coordination, and functional mobility and gait skills.       Recommendations for follow up therapy are one component of a multi-disciplinary discharge planning process, led by the attending physician.  Recommendations may be updated based on patient status, additional functional criteria and insurance authorization.  Follow Up Recommendations Home health PT; Skilled nursing-short term rehab (<3 hours/day)    Assistance Recommended at Discharge Frequent or constant Supervision/Assistance  Patient can return home with the following  A little help with walking and/or transfers;Assistance with cooking/housework;A lot of help with bathing/dressing/bathroom;Assist for transportation;Help with stairs or ramp for entrance;Direct supervision/assist for medications management    Equipment Recommendations None recommended by PT  Recommendations for Other Services       Functional Status Assessment Patient has had a recent decline in their functional status and demonstrates the ability to make significant improvements in function in a reasonable and predictable amount of time.     Precautions / Restrictions Precautions Precautions: Fall Precaution Comments: history of bilateral hip fractures after falls Restrictions Weight Bearing Restrictions: No      Mobility  Bed Mobility Overal bed mobility: Needs Assistance Bed Mobility: Supine to Sit;Sit to Supine     Supine to sit: Min assist;Min guard;HOB elevated Sit to supine: Min guard;Min assist   General bed mobility comments: slow movement, labored, painful, cues to use left foot/ankle to mobilize painful right leg    Transfers Overall transfer level: Needs assistance Equipment used: Rolling walker (2 wheels) Transfers: Sit to/from Stand Sit to Stand: Min guard           General transfer comment: cues for sequencing of steps and placement of hands for stand to sit transfer;  slow, labored transfers     Ambulation/Gait Ambulation/Gait assistance: Min guard;Min assist Gait Distance (Feet): 5 Feet Assistive device: Rolling walker (2 wheels) Gait Pattern/deviations: Step-to pattern;Decreased step length - right;Decreased stance time - right;Decreased stride length;Decreased weight shift to right;Antalgic;Trunk flexed Gait velocity: decreased     General Gait Details: slow, labored cadence, limited to short steps forward and to left along bedside to move up on bed; at times scooting or shuffling right leg forward or sideways as too painful to move right leg completely unweighted; on room air  Stairs            Wheelchair Mobility    Modified Rankin (Stroke Patients Only)       Balance Overall balance assessment: Needs assistance;History of Falls Sitting-balance support: Bilateral upper extremity supported;Feet supported Sitting balance-Leahy Scale: Good     Standing balance support: Bilateral upper extremity supported;During functional activity;Reliant on assistive device for balance Standing balance-Leahy Scale: Fair Standing balance comment: fair with RW           Pertinent Vitals/Pain Pain Assessment: 0-10 Pain Score: 8  Pain Location: with movement Pain Descriptors / Indicators: Sharp Pain Intervention(s): Limited activity within patient's tolerance;Monitored during session;Premedicated before session;Repositioned    Home Living Family/patient expects to be discharged to:: Private residence Living Arrangements: Spouse/significant other Available Help at Discharge: Family;Available 24 hours/day Type of Home: House Home Access: Stairs to enter Entrance Stairs-Rails: Right;Left;Can reach both     Home Layout: One level Home Equipment: Conservation officer, nature (2 wheels);Cane - quad;BSC/3in1;Shower seat      Prior Function Prior Level of Function : Independent/Modified Independent;History of Falls (last six months)             Mobility Comments: hiostory of  falls with previous bilateral hip fractures       Hand Dominance   Dominant Hand: Right    Extremity/Trunk Assessment   Upper Extremity Assessment Upper Extremity Assessment: Generalized weakness    Lower Extremity Assessment Lower Extremity Assessment: Generalized weakness;RLE deficits/detail RLE Deficits / Details: decreased active and passive ROM due to pain RLE: Unable to fully assess due to pain    Cervical / Trunk Assessment Cervical / Trunk Assessment: Kyphotic (scoliotic)  Communication   Communication: No difficulties  Cognition Arousal/Alertness: Awake/alert Behavior During Therapy: WFL for tasks assessed/performed Overall Cognitive Status: Within Functional Limits for tasks assessed        General Comments      Exercises     Assessment/Plan    PT Assessment Patient needs continued PT services  PT Problem List Decreased strength;Decreased mobility;Decreased range of motion;Decreased activity tolerance;Decreased balance;Decreased knowledge of use of DME;Pain       PT Treatment Interventions DME instruction;Therapeutic exercise;Gait training;Balance training;Stair training;Functional mobility training;Therapeutic activities;Patient/family education;Neuromuscular re-education    PT Goals (Current goals can be found in the Care Plan section)  Acute Rehab PT Goals Patient Stated Goal: Go home with help from boyfriend. PT Goal Formulation: With patient Time For Goal Achievement: 12/26/21 Potential to Achieve Goals: Good    Frequency Min 3X/week        AM-PAC PT "6 Clicks" Mobility  Outcome Measure Help needed turning from your back to your side while in a flat bed without using bedrails?: A Little Help needed moving from lying on your back to sitting on the side of a flat bed without using bedrails?: A Lot Help needed moving to and from a bed to a chair (including a wheelchair)?: A Little Help needed standing up from  a chair using your arms (e.g.,  wheelchair or bedside chair)?: A Little Help needed to walk in hospital room?: A Little Help needed climbing 3-5 steps with a railing? : A Lot 6 Click Score: 16    End of Session   Activity Tolerance: Patient tolerated treatment well;Patient limited by pain Patient left: in bed;with call bell/phone within reach;with bed alarm set Nurse Communication: Mobility status PT Visit Diagnosis: Unsteadiness on feet (R26.81);Other abnormalities of gait and mobility (R26.89);History of falling (Z91.81);Muscle weakness (generalized) (M62.81);Difficulty in walking, not elsewhere classified (R26.2);Pain Pain - Right/Left: Right Pain - part of body: Leg    Time: [id number] PT Time Calculation (min) (ACUTE ONLY): 30 min   Charges:   PT Evaluation $PT Eval Low Complexity: 1 Low PT Treatments $Therapeutic Activity: 8-22 mins        Floria Raveling. Hartnett-Rands, MS, PT Per [place] [phone]  Pamala Hurry  Hartnett-Rands 12/12/2021, 10:41 AM

## 2021-12-13 DIAGNOSIS — S32591A Other specified fracture of right pubis, initial encounter for closed fracture: Secondary | ICD-10-CM | POA: Diagnosis not present

## 2021-12-13 MED ORDER — SIMETHICONE 80 MG PO CHEW
80.0000 mg | CHEWABLE_TABLET | Freq: Four times a day (QID) | ORAL | Status: DC | PRN
  Administered 2021-12-13 – 2021-12-14 (×3): 80 mg via ORAL
  Filled 2021-12-13 (×4): qty 1

## 2021-12-13 MED ORDER — MOMETASONE FURO-FORMOTEROL FUM 100-5 MCG/ACT IN AERO
2.0000 | INHALATION_SPRAY | Freq: Two times a day (BID) | RESPIRATORY_TRACT | Status: DC
  Administered 2021-12-13 – 2021-12-14 (×3): 2 via RESPIRATORY_TRACT
  Filled 2021-12-13: qty 8.8

## 2021-12-13 NOTE — Progress Notes (Signed)
.    Progress Note    Kristine Roberts  EXH:[id number] DOB: 08-13-1956  DOA: 12/11/2021 PCP: Kirstie Peri, MD      Brief Narrative:    Medical records reviewed and are as summarized below:  Kristine Roberts is a 66 y.o. female with medical history significant for COPD, dysphagia, dysrhythmia, gastric outlet obstruction, osteoporosis, hiatal hernia, peptic ulcer disease, bilateral hip fracture s/p ORIF, who presented to the hospital with low back pain after a mechanical fall at home.  She said she just got new glasses and she is not used to wearing them.  This caused her to lose her balance and fall.  She said her gait is usually unsteady anyway because of previous hip problems.  She was found to have right pubic rami fracture.  She was admitted for pain control and physical therapy.    Assessment/Plan:   Principal Problem:   Closed fracture of multiple pubic rami, right, initial encounter (HCC)   Right pubic rami fracture, s/p mechanical fall at home: Continue analgesics as needed.  She said she did not feel safe and strong enough to go home, and wanted to go to SNF for rehab.  She is worried about falls at home.  She requested reevaluation by the physical therapist.  Britta Mccreedy, PT was notified by patient's request.  She updated her discharge recommendations to home health versus SNF. Levi Aland, case manager, has been notified to assist with SNF placement.  COPD: Compensated.  She said she takes Dulera inhaler at home and requested [facility] while inpatient.    Tobacco use disorder: Continue nicotine patch  Stage I sacral decubitus ulcer (present on admission): Continue local wound care   Diet Order             Diet regular Room service appropriate? Yes; Fluid consistency: Thin  Diet effective now                      Consultants: None  Procedures: None    Medications:    enoxaparin (LOVENOX) injection  40 mg Subcutaneous Q24H   mometasone-formoterol  2 puff  Inhalation BID   nicotine  21 mg Transdermal Daily   pantoprazole  40 mg Oral Daily   Continuous Infusions:   Anti-infectives (From admission, onward)    None              Family Communication/Anticipated D/C date and plan/Code Status   DVT prophylaxis: enoxaparin (LOVENOX) injection 40 mg Start: 12/11/21 1830     Code Status: Full Code  Family Communication: None Disposition Plan: Plan to discharge to home versus SNF   Status is: Observation  The patient will require care spanning > 2 midnights and should be moved to inpatient because: Unsafe discharge          Subjective:   Interval events noted.  She complains of right lower back pain.  She still does not feel safe going home.  She is worried about falling at home.  Objective:    Vitals:   12/12/21 1413 12/12/21 2033 12/13/21 0614 12/13/21 1019  BP: 108/63 106/62 (!) 104/58   Pulse: (!) 53 (!) 51 (!) 57   Resp: 19 18 17    Temp: 98.3 F (36.8 C) 97.6 F (36.4 C) 98.2 F (36.8 C)   TempSrc: Oral Oral Oral   SpO2: 99% 99% 100% 96%   No data found.   Intake/Output Summary (Last 24 hours) at 12/13/2021 1216 Last data filed at 12/13/2021  [id number] Gross per 24 hour  Intake 1510 ml  Output 600 ml  Net 910 ml   There were no vitals filed for this visit.  Exam:  GEN: NAD, cachectic SKIN: Warm and dry.  Poor skin turgor EYES: No pallor or icterus ENT: MMM CV: RRR PULM: CTA B ABD: soft, scaphoid abdomen, NT, +BS CNS: AAO x 3, non focal EXT: No edema or tenderness MSK: Right lower back tenderness     Pressure Injury 12/26/17 Stage I -  Intact skin with non-blanchable redness of a localized area usually over a bony prominence. (Active)  12/26/17 1506  Location: Sacrum  Location Orientation: Right  Staging: Stage I -  Intact skin with non-blanchable redness of a localized area usually over a bony prominence.  Wound Description (Comments):   Present on Admission: Yes     Pressure Injury  12/26/17 Stage II -  Partial thickness loss of dermis presenting as a shallow open ulcer with a red, pink wound bed without slough. (Active)  12/26/17 1507  Location: Anus  Location Orientation: Right  Staging: Stage II -  Partial thickness loss of dermis presenting as a shallow open ulcer with a red, pink wound bed without slough.  Wound Description (Comments):   Present on Admission: Yes     Data Reviewed:   I have personally reviewed following labs and imaging studies:  Labs: Labs show the following:   Basic Metabolic Panel: Recent Labs  Lab 12/11/21 1343 12/12/21 0509  NA 137 140  K 3.6 3.8  CL 104 107  CO2 21* 24  GLUCOSE 103* 78  BUN 23 20  CREATININE 0.68 0.71  CALCIUM 8.2* 8.2*   GFR CrCl cannot be calculated (Unknown ideal weight.). Liver Function Tests: No results for input(s): AST, ALT, ALKPHOS, BILITOT, PROT, ALBUMIN in the last 168 hours. No results for input(s): LIPASE, AMYLASE in the last 168 hours. No results for input(s): AMMONIA in the last 168 hours. Coagulation profile No results for input(s): INR, PROTIME in the last 168 hours.  CBC: Recent Labs  Lab 12/11/21 1343 12/12/21 0509  WBC 12.7* 6.6  HGB 12.8 11.9*  HCT 39.9 36.8  MCV 97.3 95.8  PLT 385 331   Cardiac Enzymes: No results for input(s): CKTOTAL, CKMB, CKMBINDEX, TROPONINI in the last 168 hours. BNP (last 3 results) No results for input(s): PROBNP in the last 8760 hours. CBG: No results for input(s): GLUCAP in the last 168 hours. D-Dimer: No results for input(s): DDIMER in the last 72 hours. Hgb A1c: No results for input(s): HGBA1C in the last 72 hours. Lipid Profile: No results for input(s): CHOL, HDL, LDLCALC, TRIG, CHOLHDL, LDLDIRECT in the last 72 hours. Thyroid function studies: No results for input(s): TSH, T4TOTAL, T3FREE, THYROIDAB in the last 72 hours.  Invalid input(s): FREET3 Anemia work up: Recent Labs    12/12/21 0822  VITAMINB12 >7,500*   Sepsis  Labs: Recent Labs  Lab 12/11/21 1343 12/12/21 0509  WBC 12.7* 6.6    Microbiology Recent Results (from the past 240 hour(s))  Resp Panel by RT-PCR (Flu A&B, Covid) Nasopharyngeal Swab     Status: None   Collection Time: 12/11/21  2:28 PM   Specimen: Nasopharyngeal Swab; Nasopharyngeal(NP) swabs in vial transport medium  Result Value Ref Range Status   SARS Coronavirus 2 by RT PCR NEGATIVE NEGATIVE Final    Comment: (NOTE) SARS-CoV-2 target nucleic acids are NOT DETECTED.  The SARS-CoV-2 RNA is generally detectable in upper respiratory specimens during the acute phase  of infection. The lowest concentration of SARS-CoV-2 viral copies this assay can detect is 138 copies/mL. A negative result does not preclude SARS-Cov-2 infection and should not be used as the sole basis for treatment or other patient management decisions. A negative result may occur with  improper specimen collection/handling, submission of specimen other than nasopharyngeal swab, presence of viral mutation(s) within the areas targeted by this assay, and inadequate number of viral copies(<138 copies/mL). A negative result must be combined with clinical observations, patient history, and epidemiological information. The expected result is Negative.  Fact Sheet for Patients:  BloggerCourse.com  Fact Sheet for Healthcare Providers:  SeriousBroker.it  This test is no t yet approved or cleared by the [place] FDA and  has been authorized for detection and/or diagnosis of SARS-CoV-2 by FDA under an Emergency Use Authorization (EUA). This EUA will remain  in effect (meaning this test can be used) for the duration of the COVID-19 declaration under Section 564(b)(1) of the Act, 21 U.S.C.section 360bbb-3(b)(1), unless the authorization is terminated  or revoked sooner.       Influenza A by PCR NEGATIVE NEGATIVE Final   Influenza B by PCR NEGATIVE NEGATIVE Final     Comment: (NOTE) The Xpert Xpress SARS-CoV-2/FLU/RSV plus assay is intended as an aid in the diagnosis of influenza from Nasopharyngeal swab specimens and should not be used as a sole basis for treatment. Nasal washings and aspirates are unacceptable for Xpert Xpress SARS-CoV-2/FLU/RSV testing.  Fact Sheet for Patients: BloggerCourse.com  Fact Sheet for Healthcare Providers: SeriousBroker.it  This test is not yet approved or cleared by the [place] FDA and has been authorized for detection and/or diagnosis of SARS-CoV-2 by FDA under an Emergency Use Authorization (EUA). This EUA will remain in effect (meaning this test can be used) for the duration of the COVID-19 declaration under Section 564(b)(1) of the Act, 21 U.S.C. section 360bbb-3(b)(1), unless the authorization is terminated or revoked.  Performed at [facility], [address], [place], [place] [address]     Procedures and diagnostic studies:  DG Sacrum/Coccyx  Result Date: 12/11/2021 CLINICAL DATA:  Sacrococcygeal pain after a fall today. EXAM: SACRUM AND COCCYX - 2+ VIEW COMPARISON:  CT abdomen and pelvis 08/31/2021 FINDINGS: There are minimally displaced right superior and inferior pubic rami fractures. No acute displaced sacrococcygeal fracture is identified, with assessment limited by osteopenia and overlying stool. Bilateral proximal femoral ORIF is noted. Sequelae of prior bowel surgery are noted. IMPRESSION: Acute right superior and inferior pubic rami fractures. Electronically Signed   By: Sebastian Ache M.D.   On: 12/11/2021 12:50               LOS: 0 days   Ripken Rekowski  [facility]   Pager on [email or web address]. If 7PM-7AM, please contact night-coverage at [email or web address]     12/13/2021, 12:16 PM

## 2021-12-13 NOTE — TOC Initial Note (Signed)
Transition of Care [facility]) - Initial/Assessment Note    Patient Details  Name: Kristine Roberts MRN: [id number] Date of Birth: 08-Apr-1956  Transition of Care [facility]) CM/SW Contact:    Hetty Ely, RN Phone Number: 12/13/2021, 3:00 PM  Clinical Narrative:  TOCRN spoke with patient at bedside about discharge plan. Patient voices need to go to SNF due to having no assistance at home and unable to care for self. PT to evaluate, will start the bed search process if indicated.                        Patient Goals and CMS Choice        Expected Discharge Plan and Services                                                Prior Living Arrangements/Services                       Activities of Daily Living Home Assistive Devices/Equipment: None ADL Screening (condition at time of admission) Patient's cognitive ability adequate to safely complete daily activities?: No Is the patient deaf or have difficulty hearing?: No Does the patient have difficulty seeing, even when wearing glasses/contacts?: Yes Does the patient have difficulty concentrating, remembering, or making decisions?: No Patient able to express need for assistance with ADLs?: Yes Does the patient have difficulty dressing or bathing?: Yes Independently performs ADLs?: Yes (appropriate for developmental age) Does the patient have difficulty walking or climbing stairs?: Yes Weakness of Legs: Right Weakness of Arms/Hands: None  Permission Sought/Granted                  Emotional Assessment              Admission diagnosis:  Closed fracture of multiple pubic rami, right, initial encounter (HCC) [S32.591A] Closed fracture of multiple rami of right pubis, initial encounter (HCC) [S32.591A] Patient Active Problem List   Diagnosis Date Noted   Closed fracture of multiple pubic rami, right, initial encounter (HCC) 12/11/2021   Failure to thrive in adult 07/24/2020   Gastritis and  gastroduodenitis    Hiatal hernia    H/O Billroth I operation    Anastomotic stricture of gastrojejunostomy    Non-intractable vomiting    Community acquired pneumonia of left upper lobe of lung 06/25/2020   Closed left hip fracture, initial encounter (HCC) 06/25/2020   RBBB 06/25/2020   Dehydration 08/22/2019   Nausea 08/20/2019   Abnormal liver function 08/20/2019   Hyponatremia 08/20/2019   Closed right hip fracture (HCC) 07/08/2018   Cellulitis of both lower extremities 07/08/2018   Gastric ulcer 01/11/2018   Hypokalemia 12/26/2017   Cachexia (HCC) 12/26/2017   Hypotension 12/26/2017   Tobacco abuse 12/26/2017   ETOH abuse 12/26/2017   Anemia 12/26/2017   Pressure injury of skin 12/26/2017   Protein-calorie malnutrition, severe 03/05/2016   Gastric outlet obstruction 03/03/2016   COPD (chronic obstructive pulmonary disease) (HCC) 03/03/2016   Dysphagia 02/03/2016   PCP:  Kirstie Peri, MD Pharmacy:   [facility] [address] [place] - [address] [place] [address] Phone: [phone] Fax: [phone]     Social Determinants of Health (SDOH) Interventions    Readmission Risk Interventions Readmission Risk Prevention Plan 07/01/2020 08/31/2019  Transportation Screening Complete Complete  PCP or Specialist Appt within 5-7 Days - Not Complete  Not Complete comments - SNF MD will follow up  PCP or Specialist Appt within 3-5 Days Complete -  Home Care Screening - Not Complete  Home Care Screening Not Completed Comments - Going to SNF  Medication Review (RN CM) - Complete  HRI or Home Care Consult Complete -  Social Work Consult for Recovery Care Planning/Counseling Complete -  Palliative Care Screening Complete -  Medication Review Oceanographer(RN Care Manager) Complete -  Some recent data might be hidden

## 2021-12-13 NOTE — NC FL2 (Signed)
[place] MEDICAID FL2 LEVEL OF CARE SCREENING TOOL     IDENTIFICATION  Patient Name: Kristine Roberts Birthdate: Nov 13, 1956 Sex: female Admission Date (Current Location): 12/11/2021  [facility] [place]   Facility and Address:  [facility],  [address], [place] [id number]      Provider Number: [id number]  Attending Physician Name and Address:  Lurene ShadowAyiku, Bernard, MD  Relative Name and Phone Number:  Smith,Jeffrey (Significant other)   [phone]    Current Level of Care: SNF Recommended Level of Care: Skilled Nursing Facility Prior Approval Number:    Date Approved/Denied:   PASRR Number: [id number] [id number]  Discharge Plan: SNF    Current Diagnoses: Patient Active Problem List   Diagnosis Date Noted   Closed fracture of multiple pubic rami, right, initial encounter (HCC) 12/11/2021   Failure to thrive in adult 07/24/2020   Gastritis and gastroduodenitis    Hiatal hernia    H/O Billroth I operation    Anastomotic stricture of gastrojejunostomy    Non-intractable vomiting    Community acquired pneumonia of left upper lobe of lung 06/25/2020   Closed left hip fracture, initial encounter (HCC) 06/25/2020   RBBB 06/25/2020   Dehydration 08/22/2019   Nausea 08/20/2019   Abnormal liver function 08/20/2019   Hyponatremia 08/20/2019   Closed right hip fracture (HCC) 07/08/2018   Cellulitis of both lower extremities 07/08/2018   Gastric ulcer 01/11/2018   Hypokalemia 12/26/2017   Cachexia (HCC) 12/26/2017   Hypotension 12/26/2017   Tobacco abuse 12/26/2017   ETOH abuse 12/26/2017   Anemia 12/26/2017   Pressure injury of skin 12/26/2017   Protein-calorie malnutrition, severe 03/05/2016   Gastric outlet obstruction 03/03/2016   COPD (chronic obstructive pulmonary disease) (HCC) 03/03/2016   Dysphagia 02/03/2016    Orientation RESPIRATION BLADDER Height & Weight     Self, Time, Situation, Place  Normal External catheter Weight:    Height:     BEHAVIORAL SYMPTOMS/MOOD NEUROLOGICAL BOWEL NUTRITION STATUS        Diet  AMBULATORY STATUS COMMUNICATION OF NEEDS Skin   Limited Assist Verbally Normal                       Personal Care Assistance Level of Assistance  Bathing, Feeding, Dressing Bathing Assistance: Limited assistance Feeding assistance: Independent Dressing Assistance: Limited assistance     Functional Limitations Info  Sight, Hearing, Speech Sight Info: Impaired (Wears glasses) Hearing Info: Adequate Speech Info: Adequate    SPECIAL CARE FACTORS FREQUENCY  PT (By licensed PT), OT (By licensed OT)     PT Frequency: 5X WEEK OT Frequency: 5X WEEK            Contractures Contractures Info: Present (Bilateral closed hip fracture)    Additional Factors Info  Code Status Code Status Info: FULL             Current Medications (12/13/2021):  This is the current hospital active medication list Current Facility-Administered Medications  Medication Dose Route Frequency Provider Last Rate Last Admin   acetaminophen (TYLENOL) tablet 650 mg  650 mg Oral Q6H PRN Lurene ShadowAyiku, Bernard, MD       Or   acetaminophen (TYLENOL) suppository 650 mg  650 mg Rectal Q6H PRN Lurene ShadowAyiku, Bernard, MD       enoxaparin (LOVENOX) injection 40 mg  40 mg Subcutaneous Q24H Lurene ShadowAyiku, Bernard, MD   40 mg at 12/12/21 1743   mometasone-formoterol (DULERA) 100-5 MCG/ACT inhaler 2 puff  2 puff  Inhalation BID Lurene Shadow, MD   2 puff at 12/13/21 1019   morphine 2 MG/ML injection 2 mg  2 mg Intravenous Q4H PRN Lurene Shadow, MD   2 mg at 12/13/21 1225   nicotine (NICODERM CQ - dosed in mg/24 hours) patch 21 mg  21 mg Transdermal Daily Lurene Shadow, MD   21 mg at 12/13/21 0839   ondansetron (ZOFRAN) injection 4 mg  4 mg Intravenous Q6H PRN Lurene Shadow, MD   4 mg at 12/12/21 1617   oxyCODONE (Oxy IR/ROXICODONE) immediate release tablet 5 mg  5 mg Oral Q6H PRN Lurene Shadow, MD   5 mg at 12/13/21 0842   pantoprazole (PROTONIX)  EC tablet 40 mg  40 mg Oral Daily Lurene Shadow, MD   40 mg at 12/13/21 5997   polyethylene glycol (MIRALAX / GLYCOLAX) packet 17 g  17 g Oral Daily PRN Lurene Shadow, MD         Discharge Medications: Please see discharge summary for a list of discharge medications.  Relevant Imaging Results:  Relevant Lab Results:   Additional Information SS# [id number]  Hetty Ely, RN

## 2021-12-14 DIAGNOSIS — R1314 Dysphagia, pharyngoesophageal phase: Secondary | ICD-10-CM | POA: Diagnosis not present

## 2021-12-14 DIAGNOSIS — M6281 Muscle weakness (generalized): Secondary | ICD-10-CM | POA: Diagnosis not present

## 2021-12-14 DIAGNOSIS — R339 Retention of urine, unspecified: Secondary | ICD-10-CM | POA: Diagnosis present

## 2021-12-14 DIAGNOSIS — W010XXA Fall on same level from slipping, tripping and stumbling without subsequent striking against object, initial encounter: Secondary | ICD-10-CM | POA: Diagnosis not present

## 2021-12-14 DIAGNOSIS — R262 Difficulty in walking, not elsewhere classified: Secondary | ICD-10-CM | POA: Diagnosis not present

## 2021-12-14 DIAGNOSIS — Z803 Family history of malignant neoplasm of breast: Secondary | ICD-10-CM | POA: Diagnosis not present

## 2021-12-14 DIAGNOSIS — Y92512 Supermarket, store or market as the place of occurrence of the external cause: Secondary | ICD-10-CM | POA: Diagnosis not present

## 2021-12-14 DIAGNOSIS — S32591A Other specified fracture of right pubis, initial encounter for closed fracture: Secondary | ICD-10-CM | POA: Diagnosis not present

## 2021-12-14 DIAGNOSIS — L89892 Pressure ulcer of other site, stage 2: Secondary | ICD-10-CM | POA: Diagnosis present

## 2021-12-14 DIAGNOSIS — L89151 Pressure ulcer of sacral region, stage 1: Secondary | ICD-10-CM | POA: Diagnosis not present

## 2021-12-14 DIAGNOSIS — Z20822 Contact with and (suspected) exposure to covid-19: Secondary | ICD-10-CM | POA: Diagnosis not present

## 2021-12-14 DIAGNOSIS — Z79899 Other long term (current) drug therapy: Secondary | ICD-10-CM | POA: Diagnosis not present

## 2021-12-14 DIAGNOSIS — J449 Chronic obstructive pulmonary disease, unspecified: Secondary | ICD-10-CM | POA: Diagnosis not present

## 2021-12-14 DIAGNOSIS — M810 Age-related osteoporosis without current pathological fracture: Secondary | ICD-10-CM | POA: Diagnosis not present

## 2021-12-14 DIAGNOSIS — K219 Gastro-esophageal reflux disease without esophagitis: Secondary | ICD-10-CM | POA: Diagnosis not present

## 2021-12-14 DIAGNOSIS — R5381 Other malaise: Secondary | ICD-10-CM | POA: Diagnosis not present

## 2021-12-14 DIAGNOSIS — Z808 Family history of malignant neoplasm of other organs or systems: Secondary | ICD-10-CM | POA: Diagnosis not present

## 2021-12-14 DIAGNOSIS — N319 Neuromuscular dysfunction of bladder, unspecified: Secondary | ICD-10-CM | POA: Diagnosis not present

## 2021-12-14 DIAGNOSIS — S32591D Other specified fracture of right pubis, subsequent encounter for fracture with routine healing: Secondary | ICD-10-CM | POA: Diagnosis not present

## 2021-12-14 DIAGNOSIS — R1310 Dysphagia, unspecified: Secondary | ICD-10-CM | POA: Diagnosis not present

## 2021-12-14 DIAGNOSIS — K449 Diaphragmatic hernia without obstruction or gangrene: Secondary | ICD-10-CM | POA: Diagnosis not present

## 2021-12-14 DIAGNOSIS — Z96643 Presence of artificial hip joint, bilateral: Secondary | ICD-10-CM | POA: Diagnosis present

## 2021-12-14 DIAGNOSIS — F17210 Nicotine dependence, cigarettes, uncomplicated: Secondary | ICD-10-CM | POA: Diagnosis present

## 2021-12-14 DIAGNOSIS — Z8711 Personal history of peptic ulcer disease: Secondary | ICD-10-CM | POA: Diagnosis not present

## 2021-12-14 DIAGNOSIS — E46 Unspecified protein-calorie malnutrition: Secondary | ICD-10-CM | POA: Diagnosis not present

## 2021-12-14 DIAGNOSIS — R69 Illness, unspecified: Secondary | ICD-10-CM | POA: Diagnosis not present

## 2021-12-14 DIAGNOSIS — R279 Unspecified lack of coordination: Secondary | ICD-10-CM | POA: Diagnosis not present

## 2021-12-14 MED ORDER — MOMETASONE FURO-FORMOTEROL FUM 100-5 MCG/ACT IN AERO: 2.0000 | INHALATION_SPRAY | Freq: Two times a day (BID) | RESPIRATORY_TRACT | Status: DC

## 2021-12-14 MED ORDER — COVID-19 MRNA VAC-TRIS(PFIZER) 30 MCG/0.3ML IM SUSP
0.3000 mL | Freq: Once | INTRAMUSCULAR | Status: DC
  Filled 2021-12-14: qty 0.3

## 2021-12-14 MED ORDER — COVID-19MRNA BIVAL VAC MODERNA 50 MCG/0.5ML IM SUSP
0.5000 mL | Freq: Once | INTRAMUSCULAR | Status: AC
  Administered 2021-12-14: 0.5 mL via INTRAMUSCULAR
  Filled 2021-12-14: qty 0.5

## 2021-12-14 MED ORDER — ACETAMINOPHEN 325 MG PO TABS: 650.0000 mg | ORAL_TABLET | Freq: Four times a day (QID) | ORAL | Status: AC | PRN

## 2021-12-14 MED ORDER — OXYCODONE HCL 5 MG PO TABS: 5.0000 mg | ORAL_TABLET | Freq: Three times a day (TID) | ORAL | 0 refills | Status: DC | PRN

## 2021-12-14 NOTE — Discharge Summary (Addendum)
Physician Discharge Summary  Kristine Roberts TMH:[id number] DOB: 07-31-1956 DOA: 12/11/2021  PCP: Monico Blitz, MD  Admit date: 12/11/2021 Discharge date: 12/14/2021  Discharge disposition: SNF   Recommendations for Outpatient Follow-Up:   Follow-up with physician at the nursing home within 3 days of discharge. Follow-up with PCP after discharge from the nursing home.   Discharge Diagnosis:   Principal Problem:   Closed fracture of multiple pubic rami, right, initial encounter [facility])    Discharge Condition: Stable.  Diet recommendation:  Diet Order             Diet general           Diet regular Room service appropriate? Yes; Fluid consistency: Thin  Diet effective now                     Code Status: Full Code     Hospital Course:   Kristine Roberts is a 66 y.o. female with medical history significant for COPD, dysphagia, dysrhythmia, gastric outlet obstruction, osteoporosis, hiatal hernia, peptic ulcer disease, bilateral hip fracture s/p ORIF, who presented to the hospital with low back pain after a mechanical fall at home.  She said she just got new glasses and she is not used to wearing them.  This caused her to lose her balance and fall.  She said her gait is usually unsteady anyway because of previous hip problems.   She was found to have right pubic rami fracture.  She was admitted for pain control and physical therapy.  She was treated with analgesics.  She also has elevated vitamin B12 level (> 7,500).  She takes monthly vitamin B12 injections and daily vitamin B12 tablets.  Because of elevated vitamin B12 level, she has been advised to discontinue vitamin B12 treatment and follow-up with PCP for repeat vitamin B12 levels and further recommendations.  She was evaluated by PT and OT who recommended further rehabilitation at the skilled nursing facility.  Her back pain is better and she is deemed stable for discharge to SNF today.  Discharge plan was  discussed with the patient and her significant other at the bedside.  Of note, patient required in and out urethral catheterization x1 for urinary retention.  She says she has no history of urinary retention or problems passing urine.  She thinks this is temporary because she has been laying down most of the time.  She also said it is hard for her to pass urine on her own with the Purewick in between her legs.  She believes she would be okay without any Foley catheter.   Discharge Exam:    Vitals:   12/13/21 2100 12/13/21 2102 12/14/21 0537 12/14/21 0704  BP: (!) 96/54 114/70 112/68   Pulse: (!) 59 (!) 57 62   Resp: 20 20 17    Temp: 98 F (36.7 C) 98 F (36.7 C) 98.1 F (36.7 C)   TempSrc: Oral Oral Oral   SpO2: 98% 98% 97% 98%     GEN: NAD SKIN: Warm and dry EYES: EOMI, no pallor or icterus ENT: MMM CV: RRR PULM: CTA B ABD: soft, scaphoid abdomen, NT, +BS CNS: AAO x 3, non focal EXT: No edema or tenderness MSK: Right lower back/right hip tenderness   The results of significant diagnostics from this hospitalization (including imaging, microbiology, ancillary and laboratory) are listed below for reference.     Procedures and Diagnostic Studies:   DG Sacrum/Coccyx  Result Date: 12/11/2021 CLINICAL DATA:  Sacrococcygeal pain after a fall today. EXAM: SACRUM AND COCCYX - 2+ VIEW COMPARISON:  CT abdomen and pelvis 08/31/2021 FINDINGS: There are minimally displaced right superior and inferior pubic rami fractures. No acute displaced sacrococcygeal fracture is identified, with assessment limited by osteopenia and overlying stool. Bilateral proximal femoral ORIF is noted. Sequelae of prior bowel surgery are noted. IMPRESSION: Acute right superior and inferior pubic rami fractures. Electronically Signed   By: Logan Bores M.D.   On: 12/11/2021 12:50     Labs:   Basic Metabolic Panel: Recent Labs  Lab 12/11/21 1343 12/12/21 0509  NA 137 140  K 3.6 3.8  CL 104 107  CO2 21*  24  GLUCOSE 103* 78  BUN 23 20  CREATININE 0.68 0.71  CALCIUM 8.2* 8.2*   GFR CrCl cannot be calculated (Unknown ideal weight.). Liver Function Tests: No results for input(s): AST, ALT, ALKPHOS, BILITOT, PROT, ALBUMIN in the last 168 hours. No results for input(s): LIPASE, AMYLASE in the last 168 hours. No results for input(s): AMMONIA in the last 168 hours. Coagulation profile No results for input(s): INR, PROTIME in the last 168 hours.  CBC: Recent Labs  Lab 12/11/21 1343 12/12/21 0509  WBC 12.7* 6.6  HGB 12.8 11.9*  HCT 39.9 36.8  MCV 97.3 95.8  PLT 385 331   Cardiac Enzymes: No results for input(s): CKTOTAL, CKMB, CKMBINDEX, TROPONINI in the last 168 hours. BNP: Invalid input(s): POCBNP CBG: No results for input(s): GLUCAP in the last 168 hours. D-Dimer No results for input(s): DDIMER in the last 72 hours. Hgb A1c No results for input(s): HGBA1C in the last 72 hours. Lipid Profile No results for input(s): CHOL, HDL, LDLCALC, TRIG, CHOLHDL, LDLDIRECT in the last 72 hours. Thyroid function studies No results for input(s): TSH, T4TOTAL, T3FREE, THYROIDAB in the last 72 hours.  Invalid input(s): FREET3 Anemia work up [employer]    12/12/21 0822  [id number] >7,500*   Microbiology Recent Results (from the past 240 hour(s))  Resp Panel by RT-PCR (Flu A&B, Covid) Nasopharyngeal Swab     Status: None   Collection Time: 12/11/21  2:28 PM   Specimen: Nasopharyngeal Swab; Nasopharyngeal(NP) swabs in vial transport medium  Result Value Ref Range Status   SARS Coronavirus 2 by RT PCR NEGATIVE NEGATIVE Final    Comment: (NOTE) SARS-CoV-2 target nucleic acids are NOT DETECTED.  The SARS-CoV-2 RNA is generally detectable in upper respiratory specimens during the acute phase of infection. The lowest concentration of SARS-CoV-2 viral copies this assay can detect is 138 copies/mL. A negative result does not preclude SARS-Cov-2 infection and should not be used as the  sole basis for treatment or other patient management decisions. A negative result may occur with  improper specimen collection/handling, submission of specimen other than nasopharyngeal swab, presence of viral mutation(s) within the areas targeted by this assay, and inadequate number of viral copies(<138 copies/mL). A negative result must be combined with clinical observations, patient history, and epidemiological information. The expected result is Negative.  Fact Sheet for Patients:  EntrepreneurPulse.com.au  Fact Sheet for Healthcare Providers:  IncredibleEmployment.be  This test is no t yet approved or cleared by the [place] FDA and  has been authorized for detection and/or diagnosis of SARS-CoV-2 by FDA under an Emergency Use Authorization (EUA). This EUA will remain  in effect (meaning this test can be used) for the duration of the COVID-19 declaration under Section 564(b)(1) of the Act, 21 U.S.C.section 360bbb-3(b)(1), unless the authorization is terminated  or  revoked sooner.       Influenza A by PCR NEGATIVE NEGATIVE Final   Influenza B by PCR NEGATIVE NEGATIVE Final    Comment: (NOTE) The Xpert Xpress SARS-CoV-2/FLU/RSV plus assay is intended as an aid in the diagnosis of influenza from Nasopharyngeal swab specimens and should not be used as a sole basis for treatment. Nasal washings and aspirates are unacceptable for Xpert Xpress SARS-CoV-2/FLU/RSV testing.  Fact Sheet for Patients: EntrepreneurPulse.com.au  Fact Sheet for Healthcare Providers: IncredibleEmployment.be  This test is not yet approved or cleared by the [place] FDA and has been authorized for detection and/or diagnosis of SARS-CoV-2 by FDA under an Emergency Use Authorization (EUA). This EUA will remain in effect (meaning this test can be used) for the duration of the COVID-19 declaration under Section 564(b)(1) of the  Act, 21 U.S.C. section 360bbb-3(b)(1), unless the authorization is terminated or revoked.  Performed at [facility], [address], [place], [place] [address]      Discharge Instructions:   Discharge Instructions     Diet general   Complete by: As directed    Increase activity slowly   Complete by: As directed       Allergies as of 12/14/2021       Reactions   Penicillins Itching, Swelling, Other (See Comments)   Tongue swells Has patient had a PCN reaction causing immediate rash, facial/tongue/throat swelling, SOB or lightheadedness with hypotension: Yes, had a rash and tongue swelled up. No SOB and lightheadedness. Has patient had a PCN reaction causing severe rash involving mucus membranes or skin necrosis: No Has patient had a PCN reaction that required hospitalization: No Has patient had a PCN reaction occurring within the last 10 years: No If all of the above answers are "NO", then may procee        Medication List     STOP taking these medications    Cyanocobalamin 1000 MCG/ML Kit   Potassium 99 MG Tabs   Primatene Mist 0.125 MG/ACT Aero Generic drug: EPINEPHrine   vitamin B-12 1000 MCG tablet Commonly known as: CYANOCOBALAMIN       TAKE these medications    acetaminophen 325 MG tablet Commonly known as: TYLENOL Take 2 tablets (650 mg total) by mouth every 6 (six) hours as needed for up to 5 days for mild pain (or Fever >/= 101).   baclofen 10 MG tablet Commonly known as: LIORESAL Take 10 mg by mouth at bedtime.   CALTRATE 600 PLUS-VIT D PO Take 1 tablet by mouth daily.   dexlansoprazole 60 MG capsule Commonly known as: DEXILANT Take 1 capsule (60 mg total) by mouth daily.   famotidine 20 MG tablet Commonly known as: PEPCID Take 20 mg by mouth daily.   furosemide 20 MG tablet Commonly known as: LASIX Take 20 mg by mouth daily.   mometasone-formoterol 100-5 MCG/ACT Aero Commonly known as: DULERA Inhale 2 puffs into the lungs 2 (two)  times daily.   multivitamin with minerals Tabs tablet Take 1 tablet by mouth daily. Multivitamin for Women 50+   oxyCODONE 5 MG immediate release tablet Commonly known as: Oxy IR/ROXICODONE Take 1 tablet (5 mg total) by mouth every 8 (eight) hours as needed for moderate pain.   sertraline 25 MG tablet Commonly known as: ZOLOFT Take 25 mg by mouth daily.   traZODone 100 MG tablet Commonly known as: DESYREL Take 100 mg by mouth at bedtime.   VISINE OP Place 1 drop into both eyes every other day.  Contact information for after-discharge care     [facility] .   Service: Skilled Nursing Contact information: [address] [place] [phone]                       If you experience worsening of your admission symptoms, develop shortness of breath, life threatening emergency, suicidal or homicidal thoughts you must seek medical attention immediately by calling 911 or calling your MD immediately  if symptoms less severe.   You must read complete instructions/literature along with all the possible adverse reactions/side effects for all the medicines you take and that have been prescribed to you. Take any new medicines after you have completely understood and accept all the possible adverse reactions/side effects.    Please note   You were cared for by a hospitalist during your hospital stay. If you have any questions about your discharge medications or the care you received while you were in the hospital after you are discharged, you can call the unit and asked to speak with the hospitalist on call if the hospitalist that took care of you is not available. Once you are discharged, your primary care physician will handle any further medical issues. Please note that NO REFILLS for any discharge medications will be authorized once you are discharged, as it is imperative that you  return to your primary care physician (or establish a relationship with a primary care physician if you do not have one) for your aftercare needs so that they can reassess your need for medications and monitor your lab values.       Time coordinating discharge: 32 minutes  Signed:  Madina Galati  [facility] 12/14/2021, 12:05 PM   Pager on [email or web address]. If 7PM-7AM, please contact night-coverage at [email or web address]

## 2021-12-14 NOTE — Progress Notes (Signed)
Report called to [facility] at [facility]. Transportation will be here at 3p.

## 2021-12-14 NOTE — Evaluation (Signed)
Occupational Therapy Evaluation Patient Details Name: Kristine Roberts MRN: [id number] DOB: [birth year] Today's Date: 12/14/2021   History of Present Illness Kristine Roberts is a 65 y.o. female with medical history significant for COPD, dysphagia, dysrhythmia, gastric outlet obstruction, osteoporosis, hiatal hernia, peptic ulcer disease, bilateral hip fracture s/p ORIF, who presented to the hospital with low back pain after a mechanical fall at home.  She said she just got new glasses and she is not used to wearing them.  This caused her to lose her balance and fall.  She said her gait is usually unsteady anyway because of previous hip problems. Imaging reveals pubic rami fractures, ortho recommends conservative management, WBAT.   Clinical Impression   Pt agreeable to OT evaluation, significant other-Jeff, entered during session however pt asked Trey Paula to wait outside during session. Pt requiring significantly increased time for task completion due to pain, pain medication administered before session per pt report. Pt performing ADLs while seated, max assist for LB tasks. Pt reports Trey Paula is unable to provide the level of assistance she needs and she does not feel safe returning home yet, therefore is agreeable to SNF prior to returning home. Recommend SNF on discharge to improve safety and independence in ADLs and mobility tasks.       Recommendations for follow up therapy are one component of a multi-disciplinary discharge planning process, led by the attending physician.  Recommendations may be updated based on patient status, additional functional criteria and insurance authorization.   Follow Up Recommendations  Skilled nursing-short term rehab (<3 hours/day)    Assistance Recommended at Discharge Frequent or constant Supervision/Assistance  Patient can return home with the following A lot of help with walking and/or transfers;A lot of help with bathing/dressing/bathroom;Assistance with  cooking/housework;Assist for transportation;Help with stairs or ramp for entrance    Functional Status Assessment  Patient has had a recent decline in their functional status and demonstrates the ability to make significant improvements in function in a reasonable and predictable amount of time.  Equipment Recommendations  None recommended by OT    Recommendations for Other Services       Precautions / Restrictions Precautions Precautions: Fall Restrictions Weight Bearing Restrictions: Yes RLE Weight Bearing: Weight bearing as tolerated LLE Weight Bearing: Weight bearing as tolerated      Mobility Bed Mobility Overal bed mobility: Needs Assistance Bed Mobility: Supine to Sit     Supine to sit: Min guard;HOB elevated     General bed mobility comments: slow movement, increased time, however able to come to EOB independently    Transfers Overall transfer level: Needs assistance Equipment used: Rolling walker (2 wheels) Transfers: Sit to/from Stand;Bed to chair/wheelchair/BSC Sit to Stand: Min assist Stand pivot transfers: Min assist         General transfer comment: cues for sequencing of steps and placement of hands for stand to sit transfer; slow, labored transfers          ADL either performed or assessed with clinical judgement   ADL Overall ADL's : Needs assistance/impaired Eating/Feeding: Modified independent;Sitting;Bed level   Grooming: Set up;Sitting Grooming Details (indicate cue type and reason): Pt is unable to stand without BUE assist therefore cannot complete standing ADLs Upper Body Bathing: Moderate assistance;Sitting Upper Body Bathing Details (indicate cue type and reason): assist for back Lower Body Bathing: Maximal assistance;Sitting/lateral leans   Upper Body Dressing : Moderate assistance;Sitting Upper Body Dressing Details (indicate cue type and reason): assist for doffing soiled gown and donning clean  gown Lower Body Dressing: Maximal  assistance;Sitting/lateral leans Lower Body Dressing Details (indicate cue type and reason): Pt unable to bend forward to donn shoes due to pain, OT assist with donning slippers Toilet Transfer: Minimal assistance;Stand-pivot;Rolling walker (2 wheels) Toilet Transfer Details (indicate cue type and reason): simulated with bed to chair transfer, min assist and increased time           General ADL Comments: Pt requiring high level of assistance with ADLs due to pain, weakness, and decreased balance limiting safety and independence     Vision Baseline Vision/History: 0 No visual deficits Ability to See in Adequate Light: 0 Adequate Vision Assessment?: No apparent visual deficits            Pertinent Vitals/Pain Pain Assessment: 0-10 Pain Score: 7  Pain Location: with movement Pain Descriptors / Indicators: Sore;Sharp Pain Intervention(s): Limited activity within patient's tolerance;Monitored during session;Premedicated before session;Repositioned     Hand Dominance Right   Extremity/Trunk Assessment Upper Extremity Assessment Upper Extremity Assessment: Generalized weakness   Lower Extremity Assessment Lower Extremity Assessment: Defer to PT evaluation   Cervical / Trunk Assessment Cervical / Trunk Assessment: Kyphotic   Communication Communication Communication: No difficulties   Cognition Arousal/Alertness: Awake/alert Behavior During Therapy: WFL for tasks assessed/performed Overall Cognitive Status: Within Functional Limits for tasks assessed                                                  Home Living Family/patient expects to be discharged to:: Private residence Living Arrangements: Spouse/significant other Available Help at Discharge: Family;Available 24 hours/day Type of Home: House Home Access: Stairs to enter [employer] of Steps: 3 Entrance Stairs-Rails: Right;Left;Can reach both Home Layout: One level     Bathroom  Shower/Tub: Producer, television/film/video: Standard     Home Equipment: Agricultural consultant (2 wheels);Cane - quad;BSC/3in1;Shower seat;Grab bars - tub/shower          Prior Functioning/Environment Prior Level of Function : Independent/Modified Independent;History of Falls (last six months)             Mobility Comments: hiostory of falls with previous bilateral hip fractures ADLs Comments: independent PTA        OT Problem List: Decreased strength;Decreased activity tolerance;Impaired balance (sitting and/or standing);Decreased knowledge of use of DME or AE;Decreased safety awareness;Pain      OT Treatment/Interventions: Self-care/ADL training;Therapeutic exercise;DME and/or AE instruction;Therapeutic activities;Patient/family education    OT Goals(Current goals can be found in the care plan section) Acute Rehab OT Goals Patient Stated Goal: To go to rehab and get stronger OT Goal Formulation: With patient Time For Goal Achievement: 12/28/21 Potential to Achieve Goals: Good  OT Frequency: Min 1X/week          End of Session Equipment Utilized During Treatment: Gait belt;Rolling walker (2 wheels)  Activity Tolerance: Patient limited by pain Patient left: in bed;with call bell/phone within reach;with family/visitor present  OT Visit Diagnosis: Muscle weakness (generalized) (M62.81);Repeated falls (R29.6);Unsteadiness on feet (R26.81);History of falling (Z91.81);Pain Pain - Right/Left: Right Pain - part of body: Hip                Time: 5409-8119 OT Time Calculation (min): 30 min Charges:  OT General Charges $OT Visit: 1 Visit OT Evaluation $OT Eval Low Complexity: 1 Low    Ezra Sites, OTR/L  [phone]  12/14/2021, 9:39 AM

## 2021-12-14 NOTE — Plan of Care (Signed)
°  Problem: Acute Rehab OT Goals (only OT should resolve) Goal: Pt. Will Perform Eating Flowsheets (Taken 12/14/2021 0942) Pt Will Perform Eating:  with modified independence  sitting Goal: Pt. Will Perform Grooming Flowsheets (Taken 12/14/2021 0942) Pt Will Perform Grooming:  with min guard assist  standing Goal: Pt. Will Perform Lower Body Dressing Flowsheets (Taken 12/14/2021 0942) Pt Will Perform Lower Body Dressing:  with min assist  sitting/lateral leans  sit to/from stand Goal: Pt. Will Transfer To Toilet Flowsheets (Taken 12/14/2021 [phone]) Pt Will Transfer to Toilet:  with supervision  stand pivot transfer  regular height toilet  bedside commode Goal: Pt. Will Perform Toileting-Clothing Manipulation Flowsheets (Taken 12/14/2021 0942) Pt Will Perform Toileting - Clothing Manipulation and hygiene:  with supervision  sitting/lateral leans  sit to/from stand Goal: Pt/Caregiver Will Perform Home Exercise Program Flowsheets (Taken 12/14/2021 [phone]) Pt/caregiver will Perform Home Exercise Program:  Increased strength  Both right and left upper extremity  Independently  With written HEP provided

## 2021-12-14 NOTE — TOC Transition Note (Signed)
Transition of Care [facility]) - CM/SW Discharge Note   Patient Details  Name: Kristine Roberts MRN: [id number] Date of Birth: 1956/02/05  Transition of [facility] CM/SW Contact:  Elliot Gault, LCSW Phone Number: 12/14/2021, 1:52 PM   Clinical Narrative:     Pt stable for dc today per MD. Reviewed SNF bed offers with pt and she selected [facility]. Updated Jill Side at [facility] and they can take pt today under the [employer]. DC clinical sent electronically, RN to call report. W/C Zenaida Niece transport arranged for 3pm.   There are no other TOC needs for dc.  Final next level of care: Skilled Nursing Facility Barriers to Discharge: Barriers Resolved   Patient Goals and CMS Choice Patient states their goals for this hospitalization and ongoing recovery are:: get better CMS Medicare.gov Compare Post Acute Care list provided to:: Patient Choice offered to / list presented to : Patient  Discharge Placement   Existing PASRR number confirmed : 12/13/21          Patient chooses bed at: [facility] Patient to be transferred to facility by: [place] Name of family member notified: pt only Patient and family notified of of transfer: 12/14/21  Discharge Plan and Services                                     Social Determinants of Health (SDOH) Interventions     Readmission Risk Interventions Readmission Risk Prevention Plan 07/01/2020 08/31/2019  Transportation Screening Complete Complete  PCP or Specialist Appt within 5-7 Days - Not Complete  Not Complete comments - SNF MD will follow up  PCP or Specialist Appt within 3-5 Days Complete -  Home Care Screening - Not Complete  Home Care Screening Not Completed Comments - Going to SNF  Medication Review (RN CM) - Complete  HRI or Home Care Consult Complete -  Social Work Consult for Recovery Care Planning/Counseling Complete -  Palliative Care Screening Complete -  Medication Review Oceanographer) Complete -   Some recent data might be hidden

## 2021-12-17 DIAGNOSIS — S32591A Other specified fracture of right pubis, initial encounter for closed fracture: Secondary | ICD-10-CM | POA: Diagnosis not present

## 2021-12-17 DIAGNOSIS — R5381 Other malaise: Secondary | ICD-10-CM | POA: Diagnosis not present

## 2021-12-17 DIAGNOSIS — R262 Difficulty in walking, not elsewhere classified: Secondary | ICD-10-CM | POA: Diagnosis not present

## 2021-12-17 DIAGNOSIS — M6281 Muscle weakness (generalized): Secondary | ICD-10-CM | POA: Diagnosis not present

## 2021-12-21 DIAGNOSIS — M6281 Muscle weakness (generalized): Secondary | ICD-10-CM | POA: Diagnosis not present

## 2021-12-21 DIAGNOSIS — S32591D Other specified fracture of right pubis, subsequent encounter for fracture with routine healing: Secondary | ICD-10-CM | POA: Diagnosis not present

## 2021-12-21 DIAGNOSIS — R262 Difficulty in walking, not elsewhere classified: Secondary | ICD-10-CM | POA: Diagnosis not present

## 2021-12-22 DIAGNOSIS — S32591D Other specified fracture of right pubis, subsequent encounter for fracture with routine healing: Secondary | ICD-10-CM | POA: Diagnosis not present

## 2021-12-22 DIAGNOSIS — M6281 Muscle weakness (generalized): Secondary | ICD-10-CM | POA: Diagnosis not present

## 2021-12-22 DIAGNOSIS — R262 Difficulty in walking, not elsewhere classified: Secondary | ICD-10-CM | POA: Diagnosis not present

## 2021-12-23 DIAGNOSIS — S32591D Other specified fracture of right pubis, subsequent encounter for fracture with routine healing: Secondary | ICD-10-CM | POA: Diagnosis not present

## 2021-12-23 DIAGNOSIS — M6281 Muscle weakness (generalized): Secondary | ICD-10-CM | POA: Diagnosis not present

## 2021-12-23 DIAGNOSIS — R262 Difficulty in walking, not elsewhere classified: Secondary | ICD-10-CM | POA: Diagnosis not present

## 2021-12-24 DIAGNOSIS — E559 Vitamin D deficiency, unspecified: Secondary | ICD-10-CM | POA: Diagnosis not present

## 2021-12-24 DIAGNOSIS — R262 Difficulty in walking, not elsewhere classified: Secondary | ICD-10-CM | POA: Diagnosis not present

## 2021-12-24 DIAGNOSIS — R52 Pain, unspecified: Secondary | ICD-10-CM | POA: Diagnosis not present

## 2021-12-24 DIAGNOSIS — M6281 Muscle weakness (generalized): Secondary | ICD-10-CM | POA: Diagnosis not present

## 2021-12-24 DIAGNOSIS — R8290 Unspecified abnormal findings in urine: Secondary | ICD-10-CM | POA: Diagnosis not present

## 2021-12-24 DIAGNOSIS — S32591A Other specified fracture of right pubis, initial encounter for closed fracture: Secondary | ICD-10-CM | POA: Diagnosis not present

## 2021-12-24 DIAGNOSIS — S32591D Other specified fracture of right pubis, subsequent encounter for fracture with routine healing: Secondary | ICD-10-CM | POA: Diagnosis not present

## 2021-12-24 DIAGNOSIS — R1110 Vomiting, unspecified: Secondary | ICD-10-CM | POA: Diagnosis not present

## 2021-12-24 DIAGNOSIS — I10 Essential (primary) hypertension: Secondary | ICD-10-CM | POA: Diagnosis not present

## 2021-12-25 DIAGNOSIS — S32591D Other specified fracture of right pubis, subsequent encounter for fracture with routine healing: Secondary | ICD-10-CM | POA: Diagnosis not present

## 2021-12-25 DIAGNOSIS — M6281 Muscle weakness (generalized): Secondary | ICD-10-CM | POA: Diagnosis not present

## 2021-12-25 DIAGNOSIS — R262 Difficulty in walking, not elsewhere classified: Secondary | ICD-10-CM | POA: Diagnosis not present

## 2021-12-27 NOTE — Progress Notes (Signed)
H&P  Chief Complaint: History of pelvic fracture with urinary retention  History of Present Illness: Kristine Roberts is a 66 y.o. year old female without longstanding prior urologic history presents with a Foley catheter in place.  She underwent hospitalization for fractured pubis on the sixth of this month.  She was hospitalized for couple of days.  She is now on a walker.  During the hospitalization she was unable to void, as she has significant pain in that area.  Catheter was placed.  She has had no real issues with a catheter since that time and is here today for management.  She has no longstanding urinary complaints.  She typically has a good stream and empties well.  She does not currently have constipation.  She does not have a history of incontinence.  She does have osteoporosis.  She has had prior bilateral total hip arthroplasties.  Past Medical History:  Diagnosis Date   COPD (chronic obstructive pulmonary disease) (HCC)    Dysphagia    Dysrhythmia    Esophagitis    Gastric outlet obstruction    Gastric stenosis    GERD (gastroesophageal reflux disease)    Hiatal hernia    Osteoporosis    PUD (peptic ulcer disease)    Wounds, multiple     Past Surgical History:  Procedure Laterality Date   APPENDECTOMY     BALLOON DILATION N/A 03/05/2016   Procedure: BALLOON DILATION;  Surgeon: Rogene Houston, MD;  [facility];  Service: Endoscopy;  Laterality: N/A;  pyloric channel dialtaion   BALLOON DILATION N/A 07/14/2018   Procedure: BALLOON DILATION;  Surgeon: Jackquline Denmark, MD;  Location: [facility] ENDOSCOPY;  Service: Endoscopy;  Laterality: N/A;   BALLOON DILATION  10/03/2020   Procedure: BALLOON DILATION;  Surgeon: Montez Morita, Quillian Quince, MD;  [facility];  Service: Gastroenterology;;   Larrie Kass DILATION  12/19/2020   Procedure: [place];  Surgeon: Montez Morita, Quillian Quince, MD;  [facility];  Service: Gastroenterology;;   BIOPSY   07/14/2018   Procedure: BIOPSY;  Surgeon: Jackquline Denmark, MD;  Location: [facility] ENDOSCOPY;  Service: Endoscopy;;   BIOPSY  07/03/2020   Procedure: BIOPSY;  Surgeon: Lavena Bullion, DO;  Location: [place] ENDOSCOPY;  Service: Gastroenterology;;   CHOLECYSTECTOMY     COLONOSCOPY WITH PROPOFOL N/A 08/01/2020   Procedure: COLONOSCOPY WITH PROPOFOL;  Surgeon: Harvel Quale, MD;  [facility];  Service: Gastroenterology;  Laterality: N/A;  1130   ESOPHAGEAL DILATION N/A 03/03/2016   Procedure: ESOPHAGEAL DILATION;  Surgeon: Rogene Houston, MD;  [facility];  Service: Endoscopy;  Laterality: N/A;   ESOPHAGEAL DILATION N/A 05/07/2016   Procedure: ESOPHAGEAL DILATION;  Surgeon: Rogene Houston, MD;  [facility];  Service: Endoscopy;  Laterality: N/A;   ESOPHAGEAL DILATION  01/26/2018   Procedure: DILATION OF ANASTOMOTIC  STRICTURE;  Surgeon: Rogene Houston, MD;  [facility];  Service: Endoscopy;;   ESOPHAGEAL DILATION N/A 08/01/2020   Procedure: ESOPHAGEAL DILATION;  Surgeon: Harvel Quale, MD;  [facility];  Service: Gastroenterology;  Laterality: N/A;   ESOPHAGOGASTRODUODENOSCOPY N/A 03/03/2016   Procedure: ESOPHAGOGASTRODUODENOSCOPY (EGD);  Surgeon: Rogene Houston, MD;  [facility];  Service: Endoscopy;  Laterality: N/A;  2:00   ESOPHAGOGASTRODUODENOSCOPY N/A 05/07/2016   Procedure: ESOPHAGOGASTRODUODENOSCOPY (EGD);  Surgeon: Rogene Houston, MD;  [facility];  Service: Endoscopy;  Laterality: N/A;  855 - moved to 6/2 @ 10:15 -  Ann notified pt   ESOPHAGOGASTRODUODENOSCOPY N/A 12/28/2017   Procedure: ESOPHAGOGASTRODUODENOSCOPY (EGD) with stricture dilation;  Surgeon: Rogene Houston, MD;  [facility];  Service: Endoscopy;  Laterality: N/A;   ESOPHAGOGASTRODUODENOSCOPY N/A 01/26/2018   Procedure: ESOPHAGOGASTRODUODENOSCOPY (EGD);  Surgeon: Rogene Houston, MD;  [facility];  Service:  Endoscopy;  Laterality: N/A;  255   ESOPHAGOGASTRODUODENOSCOPY N/A 07/14/2018   Procedure: ESOPHAGOGASTRODUODENOSCOPY (EGD);  Surgeon: Jackquline Denmark, MD;  Location: [facility] ENDOSCOPY;  Service: Endoscopy;  Laterality: N/A;   ESOPHAGOGASTRODUODENOSCOPY (EGD) WITH PROPOFOL N/A 03/05/2016   Procedure: ESOPHAGOGASTRODUODENOSCOPY (EGD) WITH PROPOFOL Anastomotic stricture dilation ;  Surgeon: Rogene Houston, MD;  [facility];  Service: Endoscopy;  Laterality: N/A;  to be done in OR under fluoro   ESOPHAGOGASTRODUODENOSCOPY (EGD) WITH PROPOFOL N/A 08/22/2019   Procedure: ESOPHAGOGASTRODUODENOSCOPY (EGD) WITH PROPOFOL With anastomotic stricture dilation.;  Surgeon: Rogene Houston, MD;  [facility];  Service: Endoscopy;  Laterality: N/A;   ESOPHAGOGASTRODUODENOSCOPY (EGD) WITH PROPOFOL N/A 08/24/2019   Procedure: ESOPHAGOGASTRODUODENOSCOPY (EGD) WITH PROPOFOL with stricture dilation;  Surgeon: Rogene Houston, MD;  [facility];  Service: Endoscopy;  Laterality: N/A;   ESOPHAGOGASTRODUODENOSCOPY (EGD) WITH PROPOFOL N/A 07/03/2020   Procedure: ESOPHAGOGASTRODUODENOSCOPY (EGD) WITH PROPOFOL;  Surgeon: Lavena Bullion, DO;  Location: [place];  Service: Gastroenterology;  Laterality: N/A;   ESOPHAGOGASTRODUODENOSCOPY (EGD) WITH PROPOFOL N/A 08/01/2020   Procedure: ESOPHAGOGASTRODUODENOSCOPY (EGD) WITH PROPOFOL;  Surgeon: Harvel Quale, MD;  [facility];  Service: Gastroenterology;  Laterality: N/A;   ESOPHAGOGASTRODUODENOSCOPY (EGD) WITH PROPOFOL N/A 08/29/2020   Procedure: ESOPHAGOGASTRODUODENOSCOPY (EGD) WITH PROPOFOL;  Surgeon: Harvel Quale, MD;  [facility];  Service: Gastroenterology;  Laterality: N/A;  10:00   ESOPHAGOGASTRODUODENOSCOPY (EGD) WITH PROPOFOL N/A 10/03/2020   Procedure: ESOPHAGOGASTRODUODENOSCOPY (EGD) WITH PROPOFOL;  Surgeon: Harvel Quale, MD;  [facility];  Service: Gastroenterology;   Laterality: N/A;  200   ESOPHAGOGASTRODUODENOSCOPY (EGD) WITH PROPOFOL N/A 12/19/2020   Procedure: ESOPHAGOGASTRODUODENOSCOPY (EGD) WITH PROPOFOL;  Surgeon: Harvel Quale, MD;  [facility];  Service: Gastroenterology;  Laterality: N/A;  [place] BODY REMOVAL  07/03/2020   Procedure: FOREIGN BODY REMOVAL;  Surgeon: Lavena Bullion, DO;  Location: [place] ENDOSCOPY;  Service: Gastroenterology;;   HIP SURGERY Right 09/2020   HIP SURGERY Left 07/2020   INTRAMEDULLARY (IM) NAIL INTERTROCHANTERIC Right 07/09/2018   Procedure: INTRAMEDULLARY (IM) NAIL INTERTROCHANTRIC;  Surgeon: Nicholes Stairs, MD;  Location: [place];  Service: Orthopedics;  Laterality: Right;   INTRAMEDULLARY (IM) NAIL INTERTROCHANTERIC Left 06/27/2020   Procedure: INTRAMEDULLARY (IM) NAIL INTERTROCHANTRIC;  Surgeon: Nicholes Stairs, MD;  Location: [place];  Service: Orthopedics;  Laterality: Left;   KENALOG INJECTION  07/03/2020   Procedure: KENALOG INJECTION;  Surgeon: Lavena Bullion, DO;  Location: [facility];  Service: Gastroenterology;;   STOMACH SURGERY     TOTAL HIP ARTHROPLASTY Bilateral     Home Medications:  (Not in a hospital admission)   Allergies:  Allergies  Allergen Reactions   Penicillins Itching, Swelling and Other (See Comments)    Tongue swells Has patient had a PCN reaction causing immediate rash, facial/tongue/throat swelling, SOB or lightheadedness with hypotension: Yes, had a rash and tongue swelled up. No SOB and lightheadedness. Has patient had a PCN reaction causing severe rash involving mucus membranes or skin necrosis: No Has patient had a PCN reaction that required hospitalization: No Has patient had a PCN reaction occurring  within the last 10 years: No If all of the above answers are "NO", then may procee    Family History  Problem Relation Age of Onset   Alzheimer's disease Mother    COPD Mother    Throat cancer Father    Breast cancer Sister      Social History:  reports that she has been smoking cigarettes. She has a 30.00 pack-year smoking history. She has never used smokeless tobacco. She reports that she does not currently use alcohol after a past usage of about 12.0 standard drinks per week. She reports that she does not use drugs.  ROS: A complete review of systems was performed.  All systems are negative except for pertinent findings as noted.  Physical Exam:  Vital signs in last 24 hours: @VSRANGES @ General:  Alert and oriented, No acute distress.  Quite thin.  Using a walker HEENT: Normocephalic, atraumatic Neck: No JVD or lymphadenopathy Cardiovascular: Regular rate  Extremities: No edema Neurologic: Grossly intact  I have reviewed prior pt notes  I have reviewed notes from skilled nursing facility, hospital  I have independently reviewed prior imaging--pelvic films reviewed.  Superior and inferior right pubic rami fractures, minimally displaced   Impression/Assessment:  History of pelvic fracture with urinary retention, now voiding well  Plan:  1.  I will cover her catheterization with 3 days of sulfa  2.  I will have her come back in 2 to 3 weeks to meet with Sharee Pimple to check post void scan.  Lillette Boxer Muslima Toppins 12/27/2021, 3:39 PM  Lillette Boxer. Graycee Greeson MD

## 2021-12-28 DIAGNOSIS — R5381 Other malaise: Secondary | ICD-10-CM | POA: Diagnosis not present

## 2021-12-28 DIAGNOSIS — M6281 Muscle weakness (generalized): Secondary | ICD-10-CM | POA: Diagnosis not present

## 2021-12-28 DIAGNOSIS — S32591A Other specified fracture of right pubis, initial encounter for closed fracture: Secondary | ICD-10-CM | POA: Diagnosis not present

## 2021-12-28 DIAGNOSIS — S32591D Other specified fracture of right pubis, subsequent encounter for fracture with routine healing: Secondary | ICD-10-CM | POA: Diagnosis not present

## 2021-12-28 DIAGNOSIS — I10 Essential (primary) hypertension: Secondary | ICD-10-CM | POA: Diagnosis not present

## 2021-12-28 DIAGNOSIS — R262 Difficulty in walking, not elsewhere classified: Secondary | ICD-10-CM | POA: Diagnosis not present

## 2021-12-29 ENCOUNTER — Encounter: Payer: Self-pay | Admitting: Urology

## 2021-12-29 ENCOUNTER — Other Ambulatory Visit: Payer: Self-pay

## 2021-12-29 ENCOUNTER — Ambulatory Visit (INDEPENDENT_AMBULATORY_CARE_PROVIDER_SITE_OTHER): Payer: Medicare HMO | Admitting: Urology

## 2021-12-29 VITALS — BP 94/58 | HR 55 | Wt 75.0 lb

## 2021-12-29 DIAGNOSIS — R339 Retention of urine, unspecified: Secondary | ICD-10-CM

## 2021-12-29 DIAGNOSIS — Z8781 Personal history of (healed) traumatic fracture: Secondary | ICD-10-CM | POA: Diagnosis not present

## 2021-12-29 MED ORDER — SULFAMETHOXAZOLE-TRIMETHOPRIM 800-160 MG PO TABS: 1.0000 | ORAL_TABLET | Freq: Two times a day (BID) | ORAL | 0 refills | Status: DC

## 2021-12-29 NOTE — Progress Notes (Signed)
Urological Symptom Review  Patient is experiencing the following symptoms: Get up at night to urinate   Review of Systems  Gastrointestinal (upper)  : Vomiting Indigestion/heartburn  Gastrointestinal (lower) : Negative for lower GI symptoms  Constitutional : Weight loss Fatigue  Skin: Negative for skin symptoms  Eyes: Negative for eye symptoms  Ear/Nose/Throat : Sinus problems  Hematologic/Lymphatic: Negative for Hematologic/Lymphatic symptoms  Cardiovascular : Negative for cardiovascular symptoms  Respiratory : Shortness of breath  Endocrine: Negative for endocrine symptoms  Musculoskeletal: Back pain Joint pain  Neurological: Dizziness  Psychologic: Depression Anxiety

## 2021-12-29 NOTE — Progress Notes (Signed)
Fill and Pull Catheter Removal  Patient is present today for a catheter removal.  Patient was cleaned and prepped in a sterile fashion of sterile water/ saline was instilled into the bladder when the patient felt the urge to urinate. 70ml of water was then drained from the balloon.  A 16FR foley cath was removed from the bladder no complications were noted .  Patient as then given some time to void on their own.  Patient can void  on their own after some time.  Patient tolerated well.  Performed by: Anaiya Wisinski LPN  Follow up/ Additional notes: Per MD note

## 2021-12-31 DIAGNOSIS — M549 Dorsalgia, unspecified: Secondary | ICD-10-CM | POA: Diagnosis not present

## 2021-12-31 DIAGNOSIS — R000 Tachycardia, unspecified: Secondary | ICD-10-CM | POA: Diagnosis not present

## 2021-12-31 DIAGNOSIS — R0689 Other abnormalities of breathing: Secondary | ICD-10-CM | POA: Diagnosis not present

## 2021-12-31 DIAGNOSIS — Z743 Need for continuous supervision: Secondary | ICD-10-CM | POA: Diagnosis not present

## 2021-12-31 DIAGNOSIS — R1111 Vomiting without nausea: Secondary | ICD-10-CM | POA: Diagnosis not present

## 2022-01-01 DIAGNOSIS — I700 Atherosclerosis of aorta: Secondary | ICD-10-CM | POA: Diagnosis not present

## 2022-01-01 DIAGNOSIS — Z9181 History of falling: Secondary | ICD-10-CM | POA: Diagnosis not present

## 2022-01-01 DIAGNOSIS — S32591A Other specified fracture of right pubis, initial encounter for closed fracture: Secondary | ICD-10-CM | POA: Diagnosis not present

## 2022-01-01 DIAGNOSIS — Z79899 Other long term (current) drug therapy: Secondary | ICD-10-CM | POA: Diagnosis not present

## 2022-01-01 DIAGNOSIS — M545 Low back pain, unspecified: Secondary | ICD-10-CM | POA: Diagnosis not present

## 2022-01-01 DIAGNOSIS — S32591D Other specified fracture of right pubis, subsequent encounter for fracture with routine healing: Secondary | ICD-10-CM | POA: Diagnosis not present

## 2022-01-01 DIAGNOSIS — R69 Illness, unspecified: Secondary | ICD-10-CM | POA: Diagnosis not present

## 2022-01-01 DIAGNOSIS — Z20822 Contact with and (suspected) exposure to covid-19: Secondary | ICD-10-CM | POA: Diagnosis not present

## 2022-01-01 DIAGNOSIS — R6251 Failure to thrive (child): Secondary | ICD-10-CM | POA: Diagnosis not present

## 2022-01-01 DIAGNOSIS — R262 Difficulty in walking, not elsewhere classified: Secondary | ICD-10-CM | POA: Diagnosis not present

## 2022-01-01 DIAGNOSIS — S32501A Unspecified fracture of right pubis, initial encounter for closed fracture: Secondary | ICD-10-CM | POA: Diagnosis not present

## 2022-01-01 DIAGNOSIS — R109 Unspecified abdominal pain: Secondary | ICD-10-CM | POA: Diagnosis not present

## 2022-01-01 DIAGNOSIS — J449 Chronic obstructive pulmonary disease, unspecified: Secondary | ICD-10-CM | POA: Diagnosis not present

## 2022-01-01 DIAGNOSIS — R1310 Dysphagia, unspecified: Secondary | ICD-10-CM | POA: Diagnosis not present

## 2022-01-01 DIAGNOSIS — X58XXXA Exposure to other specified factors, initial encounter: Secondary | ICD-10-CM | POA: Diagnosis not present

## 2022-01-01 DIAGNOSIS — Z681 Body mass index (BMI) 19 or less, adult: Secondary | ICD-10-CM | POA: Diagnosis not present

## 2022-01-01 DIAGNOSIS — F502 Bulimia nervosa: Secondary | ICD-10-CM | POA: Diagnosis not present

## 2022-01-01 DIAGNOSIS — R2689 Other abnormalities of gait and mobility: Secondary | ICD-10-CM | POA: Diagnosis not present

## 2022-01-01 DIAGNOSIS — I499 Cardiac arrhythmia, unspecified: Secondary | ICD-10-CM | POA: Diagnosis not present

## 2022-01-01 DIAGNOSIS — W19XXXD Unspecified fall, subsequent encounter: Secondary | ICD-10-CM | POA: Diagnosis not present

## 2022-01-01 DIAGNOSIS — F17210 Nicotine dependence, cigarettes, uncomplicated: Secondary | ICD-10-CM | POA: Diagnosis not present

## 2022-01-01 DIAGNOSIS — Z7409 Other reduced mobility: Secondary | ICD-10-CM | POA: Diagnosis not present

## 2022-01-01 DIAGNOSIS — R627 Adult failure to thrive: Secondary | ICD-10-CM | POA: Diagnosis not present

## 2022-01-01 DIAGNOSIS — R52 Pain, unspecified: Secondary | ICD-10-CM | POA: Diagnosis not present

## 2022-01-01 DIAGNOSIS — Z880 Allergy status to penicillin: Secondary | ICD-10-CM | POA: Diagnosis not present

## 2022-01-02 DIAGNOSIS — M549 Dorsalgia, unspecified: Secondary | ICD-10-CM | POA: Diagnosis not present

## 2022-01-02 DIAGNOSIS — Z79899 Other long term (current) drug therapy: Secondary | ICD-10-CM | POA: Diagnosis not present

## 2022-01-02 DIAGNOSIS — G8929 Other chronic pain: Secondary | ICD-10-CM | POA: Diagnosis not present

## 2022-01-02 DIAGNOSIS — S32591D Other specified fracture of right pubis, subsequent encounter for fracture with routine healing: Secondary | ICD-10-CM | POA: Diagnosis not present

## 2022-01-02 DIAGNOSIS — J449 Chronic obstructive pulmonary disease, unspecified: Secondary | ICD-10-CM | POA: Diagnosis not present

## 2022-01-02 DIAGNOSIS — R69 Illness, unspecified: Secondary | ICD-10-CM | POA: Diagnosis not present

## 2022-01-03 DIAGNOSIS — G8929 Other chronic pain: Secondary | ICD-10-CM | POA: Diagnosis not present

## 2022-01-03 DIAGNOSIS — J449 Chronic obstructive pulmonary disease, unspecified: Secondary | ICD-10-CM | POA: Diagnosis not present

## 2022-01-03 DIAGNOSIS — M549 Dorsalgia, unspecified: Secondary | ICD-10-CM | POA: Diagnosis not present

## 2022-01-03 DIAGNOSIS — R69 Illness, unspecified: Secondary | ICD-10-CM | POA: Diagnosis not present

## 2022-01-03 DIAGNOSIS — Z79899 Other long term (current) drug therapy: Secondary | ICD-10-CM | POA: Diagnosis not present

## 2022-01-03 DIAGNOSIS — E440 Moderate protein-calorie malnutrition: Secondary | ICD-10-CM | POA: Diagnosis not present

## 2022-01-03 DIAGNOSIS — S32591D Other specified fracture of right pubis, subsequent encounter for fracture with routine healing: Secondary | ICD-10-CM | POA: Diagnosis not present

## 2022-01-04 DIAGNOSIS — S32591A Other specified fracture of right pubis, initial encounter for closed fracture: Secondary | ICD-10-CM | POA: Diagnosis not present

## 2022-01-04 DIAGNOSIS — R52 Pain, unspecified: Secondary | ICD-10-CM | POA: Diagnosis not present

## 2022-01-04 DIAGNOSIS — R6251 Failure to thrive (child): Secondary | ICD-10-CM | POA: Diagnosis not present

## 2022-01-04 DIAGNOSIS — R69 Illness, unspecified: Secondary | ICD-10-CM | POA: Diagnosis not present

## 2022-01-13 ENCOUNTER — Ambulatory Visit: Payer: Medicare HMO | Admitting: Physician Assistant

## 2022-01-14 DIAGNOSIS — M06049 Rheumatoid arthritis without rheumatoid factor, unspecified hand: Secondary | ICD-10-CM | POA: Diagnosis not present

## 2022-01-14 DIAGNOSIS — I700 Atherosclerosis of aorta: Secondary | ICD-10-CM | POA: Diagnosis not present

## 2022-01-14 DIAGNOSIS — F17210 Nicotine dependence, cigarettes, uncomplicated: Secondary | ICD-10-CM | POA: Diagnosis not present

## 2022-01-14 DIAGNOSIS — Z299 Encounter for prophylactic measures, unspecified: Secondary | ICD-10-CM | POA: Diagnosis not present

## 2022-01-14 DIAGNOSIS — F1020 Alcohol dependence, uncomplicated: Secondary | ICD-10-CM | POA: Diagnosis not present

## 2022-01-14 DIAGNOSIS — R69 Illness, unspecified: Secondary | ICD-10-CM | POA: Diagnosis not present

## 2022-01-14 DIAGNOSIS — J449 Chronic obstructive pulmonary disease, unspecified: Secondary | ICD-10-CM | POA: Diagnosis not present

## 2022-01-14 DIAGNOSIS — Z681 Body mass index (BMI) 19 or less, adult: Secondary | ICD-10-CM | POA: Diagnosis not present

## 2022-02-11 DIAGNOSIS — Z299 Encounter for prophylactic measures, unspecified: Secondary | ICD-10-CM | POA: Diagnosis not present

## 2022-02-11 DIAGNOSIS — Z681 Body mass index (BMI) 19 or less, adult: Secondary | ICD-10-CM | POA: Diagnosis not present

## 2022-02-11 DIAGNOSIS — S72001A Fracture of unspecified part of neck of right femur, initial encounter for closed fracture: Secondary | ICD-10-CM | POA: Diagnosis not present

## 2022-02-11 DIAGNOSIS — M549 Dorsalgia, unspecified: Secondary | ICD-10-CM | POA: Diagnosis not present

## 2022-02-11 DIAGNOSIS — R69 Illness, unspecified: Secondary | ICD-10-CM | POA: Diagnosis not present

## 2022-02-11 DIAGNOSIS — Z0000 Encounter for general adult medical examination without abnormal findings: Secondary | ICD-10-CM | POA: Diagnosis not present

## 2022-02-14 ENCOUNTER — Inpatient Hospital Stay (HOSPITAL_COMMUNITY)
Admission: EM | Admit: 2022-02-14 | Discharge: 2022-02-16 | DRG: 689 | Disposition: A | Discharge status: Home Health | Payer: Medicare HMO | Attending: Internal Medicine | Admitting: Internal Medicine

## 2022-02-14 ENCOUNTER — Emergency Department (HOSPITAL_COMMUNITY): Payer: Medicare HMO

## 2022-02-14 ENCOUNTER — Encounter (HOSPITAL_COMMUNITY): Payer: Self-pay | Admitting: Emergency Medicine

## 2022-02-14 ENCOUNTER — Inpatient Hospital Stay (HOSPITAL_COMMUNITY): Payer: Medicare HMO

## 2022-02-14 ENCOUNTER — Other Ambulatory Visit: Payer: Self-pay

## 2022-02-14 DIAGNOSIS — F17210 Nicotine dependence, cigarettes, uncomplicated: Secondary | ICD-10-CM | POA: Diagnosis present

## 2022-02-14 DIAGNOSIS — Z681 Body mass index (BMI) 19 or less, adult: Secondary | ICD-10-CM

## 2022-02-14 DIAGNOSIS — R4182 Altered mental status, unspecified: Secondary | ICD-10-CM | POA: Diagnosis not present

## 2022-02-14 DIAGNOSIS — Z716 Tobacco abuse counseling: Secondary | ICD-10-CM

## 2022-02-14 DIAGNOSIS — W19XXXA Unspecified fall, initial encounter: Secondary | ICD-10-CM | POA: Diagnosis not present

## 2022-02-14 DIAGNOSIS — R001 Bradycardia, unspecified: Secondary | ICD-10-CM

## 2022-02-14 DIAGNOSIS — G8929 Other chronic pain: Secondary | ICD-10-CM | POA: Diagnosis present

## 2022-02-14 DIAGNOSIS — F32A Depression, unspecified: Secondary | ICD-10-CM

## 2022-02-14 DIAGNOSIS — Z7189 Other specified counseling: Secondary | ICD-10-CM | POA: Diagnosis not present

## 2022-02-14 DIAGNOSIS — E876 Hypokalemia: Secondary | ICD-10-CM | POA: Diagnosis not present

## 2022-02-14 DIAGNOSIS — J439 Emphysema, unspecified: Secondary | ICD-10-CM

## 2022-02-14 DIAGNOSIS — R404 Transient alteration of awareness: Secondary | ICD-10-CM | POA: Diagnosis not present

## 2022-02-14 DIAGNOSIS — Z825 Family history of asthma and other chronic lower respiratory diseases: Secondary | ICD-10-CM | POA: Diagnosis not present

## 2022-02-14 DIAGNOSIS — M2578 Osteophyte, vertebrae: Secondary | ICD-10-CM | POA: Diagnosis not present

## 2022-02-14 DIAGNOSIS — K219 Gastro-esophageal reflux disease without esophagitis: Secondary | ICD-10-CM | POA: Diagnosis present

## 2022-02-14 DIAGNOSIS — B9620 Unspecified Escherichia coli [E. coli] as the cause of diseases classified elsewhere: Secondary | ICD-10-CM | POA: Diagnosis present

## 2022-02-14 DIAGNOSIS — R400 Somnolence: Secondary | ICD-10-CM

## 2022-02-14 DIAGNOSIS — K449 Diaphragmatic hernia without obstruction or gangrene: Secondary | ICD-10-CM | POA: Diagnosis present

## 2022-02-14 DIAGNOSIS — L89151 Pressure ulcer of sacral region, stage 1: Secondary | ICD-10-CM | POA: Diagnosis present

## 2022-02-14 DIAGNOSIS — M549 Dorsalgia, unspecified: Secondary | ICD-10-CM | POA: Diagnosis not present

## 2022-02-14 DIAGNOSIS — R55 Syncope and collapse: Secondary | ICD-10-CM | POA: Diagnosis not present

## 2022-02-14 DIAGNOSIS — Z880 Allergy status to penicillin: Secondary | ICD-10-CM

## 2022-02-14 DIAGNOSIS — S0990XA Unspecified injury of head, initial encounter: Secondary | ICD-10-CM | POA: Diagnosis not present

## 2022-02-14 DIAGNOSIS — M810 Age-related osteoporosis without current pathological fracture: Secondary | ICD-10-CM | POA: Diagnosis present

## 2022-02-14 DIAGNOSIS — I700 Atherosclerosis of aorta: Secondary | ICD-10-CM | POA: Diagnosis not present

## 2022-02-14 DIAGNOSIS — F1010 Alcohol abuse, uncomplicated: Secondary | ICD-10-CM | POA: Diagnosis present

## 2022-02-14 DIAGNOSIS — Z79891 Long term (current) use of opiate analgesic: Secondary | ICD-10-CM | POA: Diagnosis not present

## 2022-02-14 DIAGNOSIS — E860 Dehydration: Secondary | ICD-10-CM | POA: Diagnosis present

## 2022-02-14 DIAGNOSIS — J449 Chronic obstructive pulmonary disease, unspecified: Secondary | ICD-10-CM | POA: Diagnosis not present

## 2022-02-14 DIAGNOSIS — I672 Cerebral atherosclerosis: Secondary | ICD-10-CM | POA: Diagnosis not present

## 2022-02-14 DIAGNOSIS — Z20822 Contact with and (suspected) exposure to covid-19: Secondary | ICD-10-CM | POA: Diagnosis not present

## 2022-02-14 DIAGNOSIS — G9341 Metabolic encephalopathy: Secondary | ICD-10-CM | POA: Diagnosis present

## 2022-02-14 DIAGNOSIS — N390 Urinary tract infection, site not specified: Principal | ICD-10-CM | POA: Diagnosis present

## 2022-02-14 DIAGNOSIS — E43 Unspecified severe protein-calorie malnutrition: Secondary | ICD-10-CM | POA: Diagnosis present

## 2022-02-14 DIAGNOSIS — J8410 Pulmonary fibrosis, unspecified: Secondary | ICD-10-CM | POA: Diagnosis not present

## 2022-02-14 DIAGNOSIS — S299XXA Unspecified injury of thorax, initial encounter: Secondary | ICD-10-CM | POA: Diagnosis not present

## 2022-02-14 DIAGNOSIS — S32511A Fracture of superior rim of right pubis, initial encounter for closed fracture: Secondary | ICD-10-CM | POA: Diagnosis not present

## 2022-02-14 DIAGNOSIS — M419 Scoliosis, unspecified: Secondary | ICD-10-CM | POA: Diagnosis not present

## 2022-02-14 DIAGNOSIS — Z79899 Other long term (current) drug therapy: Secondary | ICD-10-CM

## 2022-02-14 DIAGNOSIS — R4020 Unspecified coma: Secondary | ICD-10-CM | POA: Diagnosis not present

## 2022-02-14 DIAGNOSIS — R69 Illness, unspecified: Secondary | ICD-10-CM | POA: Diagnosis not present

## 2022-02-14 DIAGNOSIS — Z96643 Presence of artificial hip joint, bilateral: Secondary | ICD-10-CM | POA: Diagnosis present

## 2022-02-14 DIAGNOSIS — Z743 Need for continuous supervision: Secondary | ICD-10-CM | POA: Diagnosis not present

## 2022-02-14 DIAGNOSIS — L89892 Pressure ulcer of other site, stage 2: Secondary | ICD-10-CM | POA: Diagnosis present

## 2022-02-14 LAB — I-STAT VENOUS BLOOD GAS, ED
Acid-Base Excess: 0 mmol/L (ref 0.0–2.0)
Bicarbonate: 26.2 mmol/L (ref 20.0–28.0)
Calcium, Ion: 1.12 mmol/L — ABNORMAL LOW (ref 1.15–1.40)
HCT: 36 % (ref 36.0–46.0)
Hemoglobin: 12.2 g/dL (ref 12.0–15.0)
O2 Saturation: 46 %
Potassium: 3 mmol/L — ABNORMAL LOW (ref 3.5–5.1)
Sodium: 139 mmol/L (ref 135–145)
TCO2: 28 mmol/L (ref 22–32)
pCO2, Ven: 47.6 mmHg (ref 44–60)
pH, Ven: 7.349 (ref 7.25–7.43)
pO2, Ven: 27 mmHg — CL (ref 32–45)

## 2022-02-14 LAB — URINALYSIS, ROUTINE W REFLEX MICROSCOPIC
Bilirubin Urine: NEGATIVE
Glucose, UA: NEGATIVE mg/dL
Ketones, ur: NEGATIVE mg/dL
Nitrite: NEGATIVE
Protein, ur: NEGATIVE mg/dL
Specific Gravity, Urine: 1.005 (ref 1.005–1.030)
WBC, UA: >50 WBC/hpf — ABNORMAL HIGH (ref 0–5)
pH: 5 (ref 5.0–8.0)

## 2022-02-14 LAB — CBG MONITORING, ED: Glucose-Capillary: 85 mg/dL (ref 70–99)

## 2022-02-14 LAB — COMPREHENSIVE METABOLIC PANEL
ALT: 8 U/L (ref 0–44)
AST: 18 U/L (ref 15–41)
Albumin: 2.7 g/dL — ABNORMAL LOW (ref 3.5–5.0)
Alkaline Phosphatase: 89 U/L (ref 38–126)
Anion gap: 9 (ref 5–15)
BUN: 12 mg/dL (ref 8–23)
CO2: 25 mmol/L (ref 22–32)
Calcium: 8.4 mg/dL — ABNORMAL LOW (ref 8.9–10.3)
Chloride: 105 mmol/L (ref 98–111)
Creatinine, Ser: 0.64 mg/dL (ref 0.44–1.00)
GFR, Estimated: >60 mL/min (ref >60)
Glucose, Bld: 89 mg/dL (ref 70–99)
Potassium: 2.9 mmol/L — ABNORMAL LOW (ref 3.5–5.1)
Sodium: 139 mmol/L (ref 135–145)
Total Bilirubin: 0.1 mg/dL — ABNORMAL LOW (ref 0.3–1.2)
Total Protein: 5.8 g/dL — ABNORMAL LOW (ref 6.5–8.1)

## 2022-02-14 LAB — CBC WITH DIFFERENTIAL/PLATELET
Abs Immature Granulocytes: 0.03 10*3/uL (ref 0.00–0.07)
Basophils Absolute: 0 10*3/uL (ref 0.0–0.1)
Basophils Relative: 1 %
Eosinophils Absolute: 0 10*3/uL (ref 0.0–0.5)
Eosinophils Relative: 0 %
HCT: 37.4 % (ref 36.0–46.0)
Hemoglobin: 11.1 g/dL — ABNORMAL LOW (ref 12.0–15.0)
Immature Granulocytes: 0 %
Lymphocytes Relative: 15 %
Lymphs Abs: 1 10*3/uL (ref 0.7–4.0)
MCH: 28 pg (ref 26.0–34.0)
MCHC: 29.7 g/dL — ABNORMAL LOW (ref 30.0–36.0)
MCV: 94.4 fL (ref 80.0–100.0)
Monocytes Absolute: 0.7 10*3/uL (ref 0.1–1.0)
Monocytes Relative: 11 %
Neutro Abs: 5.1 10*3/uL (ref 1.7–7.7)
Neutrophils Relative %: 73 %
Platelets: 321 10*3/uL (ref 150–400)
RBC: 3.96 MIL/uL (ref 3.87–5.11)
RDW: 17.7 % — ABNORMAL HIGH (ref 11.5–15.5)
WBC: 7 10*3/uL (ref 4.0–10.5)
nRBC: 0 % (ref 0.0–0.2)

## 2022-02-14 LAB — BASIC METABOLIC PANEL
Anion gap: 9 (ref 5–15)
BUN: 6 mg/dL — ABNORMAL LOW (ref 8–23)
CO2: 25 mmol/L (ref 22–32)
Calcium: 8.3 mg/dL — ABNORMAL LOW (ref 8.9–10.3)
Chloride: 107 mmol/L (ref 98–111)
Creatinine, Ser: 0.48 mg/dL (ref 0.44–1.00)
GFR, Estimated: >60 mL/min (ref >60)
Glucose, Bld: 82 mg/dL (ref 70–99)
Potassium: 3.3 mmol/L — ABNORMAL LOW (ref 3.5–5.1)
Sodium: 141 mmol/L (ref 135–145)

## 2022-02-14 LAB — RAPID URINE DRUG SCREEN, HOSP PERFORMED
Amphetamines: NOT DETECTED
Barbiturates: NOT DETECTED
Benzodiazepines: NOT DETECTED
Cocaine: NOT DETECTED
Opiates: NOT DETECTED
Tetrahydrocannabinol: POSITIVE — AB

## 2022-02-14 LAB — TROPONIN I (HIGH SENSITIVITY)
Troponin I (High Sensitivity): 49 ng/L — ABNORMAL HIGH (ref <18)
Troponin I (High Sensitivity): 43 ng/L — ABNORMAL HIGH (ref <18)

## 2022-02-14 LAB — MAGNESIUM: Magnesium: 1.8 mg/dL (ref 1.7–2.4)

## 2022-02-14 LAB — ETHANOL: Alcohol, Ethyl (B): <10 mg/dL (ref <10)

## 2022-02-14 LAB — I-STAT CHEM 8, ED
BUN: 13 mg/dL (ref 8–23)
Calcium, Ion: 1.1 mmol/L — ABNORMAL LOW (ref 1.15–1.40)
Chloride: 105 mmol/L (ref 98–111)
Creatinine, Ser: 0.5 mg/dL (ref 0.44–1.00)
Glucose, Bld: 81 mg/dL (ref 70–99)
HCT: 37 % (ref 36.0–46.0)
Hemoglobin: 12.6 g/dL (ref 12.0–15.0)
Potassium: 3 mmol/L — ABNORMAL LOW (ref 3.5–5.1)
Sodium: 140 mmol/L (ref 135–145)
TCO2: 26 mmol/L (ref 22–32)

## 2022-02-14 LAB — RESP PANEL BY RT-PCR (FLU A&B, COVID) ARPGX2
Influenza A by PCR: NEGATIVE
Influenza B by PCR: NEGATIVE
SARS Coronavirus 2 by RT PCR: NEGATIVE

## 2022-02-14 LAB — ACETAMINOPHEN LEVEL: Acetaminophen (Tylenol), Serum: <10 ug/mL — ABNORMAL LOW (ref 10–30)

## 2022-02-14 LAB — PHOSPHORUS: Phosphorus: 2.7 mg/dL (ref 2.5–4.6)

## 2022-02-14 LAB — TSH: TSH: 5.125 u[IU]/mL — ABNORMAL HIGH (ref 0.350–4.500)

## 2022-02-14 MED ORDER — NALOXONE HCL 0.4 MG/ML IJ SOLN
INTRAMUSCULAR | Status: AC
  Administered 2022-02-14: 0.1 mg
  Filled 2022-02-14: qty 1

## 2022-02-14 MED ORDER — ONDANSETRON HCL 4 MG/2ML IJ SOLN: 4.0000 mg | Freq: Four times a day (QID) | INTRAMUSCULAR | Status: DC | PRN

## 2022-02-14 MED ORDER — LORAZEPAM 2 MG/ML IJ SOLN
0.5000 mg | Freq: Once | INTRAMUSCULAR | Status: AC
Start: 2022-02-14 — End: 2022-02-14
  Administered 2022-02-14: 0.5 mg via INTRAVENOUS
  Filled 2022-02-14: qty 1

## 2022-02-14 MED ORDER — ONDANSETRON HCL 4 MG PO TABS: 4.0000 mg | ORAL_TABLET | Freq: Four times a day (QID) | ORAL | Status: DC | PRN

## 2022-02-14 MED ORDER — SODIUM CHLORIDE 0.9 % IV SOLN
1.0000 g | INTRAVENOUS | Status: DC
  Administered 2022-02-15 – 2022-02-16 (×2): 1 g via INTRAVENOUS
  Filled 2022-02-14 (×2): qty 10

## 2022-02-14 MED ORDER — SODIUM CHLORIDE 0.9 % IV SOLN: INTRAVENOUS | Status: DC

## 2022-02-14 MED ORDER — MAGNESIUM HYDROXIDE 400 MG/5ML PO SUSP
30.0000 mL | Freq: Every day | ORAL | Status: DC | PRN
  Filled 2022-02-14: qty 30

## 2022-02-14 MED ORDER — POTASSIUM CHLORIDE 20 MEQ PO PACK: 40.0000 meq | PACK | Freq: Once | ORAL | Status: DC

## 2022-02-14 MED ORDER — POTASSIUM CHLORIDE 10 MEQ/100ML IV SOLN
10.0000 meq | INTRAVENOUS | Status: AC
  Administered 2022-02-14 (×4): 10 meq via INTRAVENOUS
  Filled 2022-02-14 (×4): qty 100

## 2022-02-14 MED ORDER — FAMOTIDINE 20 MG PO TABS
20.0000 mg | ORAL_TABLET | Freq: Every day | ORAL | Status: DC
  Administered 2022-02-15 – 2022-02-16 (×2): 20 mg via ORAL
  Filled 2022-02-14 (×3): qty 1

## 2022-02-14 MED ORDER — FUROSEMIDE 20 MG PO TABS
20.0000 mg | ORAL_TABLET | Freq: Every day | ORAL | Status: DC
  Filled 2022-02-14: qty 1

## 2022-02-14 MED ORDER — SODIUM CHLORIDE 0.9 % IV BOLUS
500.0000 mL | Freq: Once | INTRAVENOUS | Status: AC
  Administered 2022-02-14: 500 mL via INTRAVENOUS

## 2022-02-14 MED ORDER — ADULT MULTIVITAMIN W/MINERALS CH
1.0000 | ORAL_TABLET | Freq: Every day | ORAL | Status: DC
  Administered 2022-02-15 – 2022-02-16 (×2): 1 via ORAL
  Filled 2022-02-14 (×3): qty 1

## 2022-02-14 MED ORDER — ENOXAPARIN SODIUM 40 MG/0.4ML IJ SOSY: 40.0000 mg | PREFILLED_SYRINGE | INTRAMUSCULAR | Status: DC

## 2022-02-14 MED ORDER — POTASSIUM CHLORIDE 20 MEQ PO PACK
40.0000 meq | PACK | Freq: Once | ORAL | Status: DC
Start: 2022-02-14 — End: 2022-02-14
  Filled 2022-02-14: qty 2

## 2022-02-14 MED ORDER — ACETAMINOPHEN 325 MG PO TABS
650.0000 mg | ORAL_TABLET | Freq: Four times a day (QID) | ORAL | Status: DC | PRN
  Administered 2022-02-15 – 2022-02-16 (×2): 650 mg via ORAL
  Filled 2022-02-14 (×2): qty 2

## 2022-02-14 MED ORDER — TRAZODONE HCL 50 MG PO TABS
25.0000 mg | ORAL_TABLET | Freq: Every evening | ORAL | Status: DC | PRN
  Administered 2022-02-15: 25 mg via ORAL
  Filled 2022-02-14: qty 1

## 2022-02-14 MED ORDER — ENOXAPARIN SODIUM 300 MG/3ML IJ SOLN
20.0000 mg | INTRAMUSCULAR | Status: DC
  Administered 2022-02-15 – 2022-02-16 (×2): 20 mg via SUBCUTANEOUS
  Filled 2022-02-14 (×4): qty 0.2

## 2022-02-14 MED ORDER — SODIUM CHLORIDE 0.9 % IV SOLN
1.0000 g | Freq: Once | INTRAVENOUS | Status: AC
  Administered 2022-02-14: 1 g via INTRAVENOUS
  Filled 2022-02-14: qty 10

## 2022-02-14 MED ORDER — ACETAMINOPHEN 650 MG RE SUPP: 650.0000 mg | Freq: Four times a day (QID) | RECTAL | Status: DC | PRN

## 2022-02-14 NOTE — Progress Notes (Signed)
New Admission Note:  ? ?Arrival Method: stretcher  ?Mental Orientation: Lethargic and sleepy  ?Telemetry: Box 22  ?Assessment: Completed ?Skin: Intact, scratches on legs  ?IV:  ?Pain: Sleeping  ?Tubes: none  ?Safety Measures: Safety Fall Prevention Plan has been given, discussed and signed ?Admission: Completed ?[facility] Orientation: Patient has been orientated to the room, unit and staff.  ?Family: none  ? ?Orders have been reviewed and implemented. Will continue to monitor the patient. Call light has been placed within reach and bed alarm has been activated.  ? ?Jermall Isaacson RN ?[facility] ?Phone: [phone]  ?

## 2022-02-14 NOTE — ED Notes (Signed)
Upon entering room RN asked pt birthday and pt just raised shoulders.  ? ?RN asked pt if her birthday was [birth year] and pt shook head yes.  ? ?RN asked do pt stay at home with husband. Pt says, " I stay with my boyfriend." ? ?RN asked pt do she know where she is at. Pt does not respond just looks at [employer].  ? ?RN asked pt if she is thirsty. Pt says, "Yes." RN provided water to pt but pt will not drink. ?

## 2022-02-14 NOTE — ED Notes (Signed)
RN notified Kathlen Mody MD that pt hasn't peed since RN arrival 0700 ?

## 2022-02-14 NOTE — ED Notes (Signed)
RN was reconnecting pt to get vital signs and noticed bright red blood in pt right ear. Notified Kathlen Mody MD ?

## 2022-02-14 NOTE — TOC Initial Note (Addendum)
Transition of Care (TOC) - Initial/Assessment Note  ? ? ?Patient Details  ?Name: Kristine Roberts ?MRN: [id number] ?Date of Birth: 06-02-1956 ? ?Transition of Care (TOC) CM/SW Contact:    ?Lockie Pares, RN ?Phone Number: ?02/14/2022, 1:38 PM ? ?Clinical Narrative:                 ? ?Presented to ED after found  down on the floor, husband tripped over her. [facility] with [facility]. Patient not answering many questions currently and will not eat. Narcan was given as she is on opiates for chronic pain. Some improvement in mentation. No acute abnormalities in CT of head. Does have questionable UTI no nitrate  culture pending. MRI pending. ? ?CM will follow for needs, recommendations, PT consulted, and transitions of care ? ? ?  ?Barriers to Discharge: Continued Medical Work up ? ? ?Patient Goals and CMS Choice ?  ?  ?  ? ?Expected Discharge Plan and Services ?  ?  ?  ?  ?Living arrangements for the past 2 months: Single Family Home ?                ?  ?  ?  ?  ?  ?  ?  ?  ?  ?  ? ?Prior Living Arrangements/Services ?Living arrangements for the past 2 months: Single Family Home ?Lives with:: Spouse ?Patient language and need for interpreter reviewed:: Yes ?       ?Need for Family Participation in Patient Care: Yes (Comment) ?Care giver support system in place?: Yes (comment) ?  ?Criminal Activity/Legal Involvement Pertinent to Current Situation/Hospitalization: No - Comment as needed ? ?Activities of Daily Living ?  ?  ? ?Permission Sought/Granted ?  ?  ?   ?   ?   ?   ? ?Emotional Assessment ?  ?Attitude/Demeanor/Rapport: Inconsistent ?Affect (typically observed): Guarded ?Orientation: : Fluctuating Orientation (Suspected and/or reported Sundowners) ?Alcohol / Substance Use: Not Applicable ?Psych Involvement: No (comment) ? ?Admission diagnosis:  Altered mental status [R41.82] ?Patient Active Problem List  ? Diagnosis Date Noted  ? Altered mental status 02/14/2022  ? Acute lower UTI 02/14/2022  ? GERD  without esophagitis 02/14/2022  ? Depression 02/14/2022  ? Sinus bradycardia 02/14/2022  ? Closed fracture of multiple pubic rami, right, initial encounter (HCC) 12/11/2021  ? Failure to thrive in adult 07/24/2020  ? Gastritis and gastroduodenitis   ? Hiatal hernia   ? H/O [place] I operation   ? Anastomotic stricture of gastrojejunostomy   ? Non-intractable vomiting   ? Community acquired pneumonia of left upper lobe of lung 06/25/2020  ? Closed left hip fracture, initial encounter (HCC) 06/25/2020  ? RBBB 06/25/2020  ? Dehydration 08/22/2019  ? Nausea 08/20/2019  ? Abnormal liver function 08/20/2019  ? Hyponatremia 08/20/2019  ? Closed right hip fracture (HCC) 07/08/2018  ? Cellulitis of both lower extremities 07/08/2018  ? Gastric ulcer 01/11/2018  ? Hypokalemia 12/26/2017  ? Cachexia (HCC) 12/26/2017  ? Hypotension 12/26/2017  ? Tobacco abuse 12/26/2017  ? ETOH abuse 12/26/2017  ? Anemia 12/26/2017  ? Pressure injury of skin 12/26/2017  ? Protein-calorie malnutrition, severe 03/05/2016  ? Gastric outlet obstruction 03/03/2016  ? COPD (chronic obstructive pulmonary disease) (HCC) 03/03/2016  ? Dysphagia 02/03/2016  ? ?PCP:  Kirstie Peri, MD ?Pharmacy:   ?[facility] [address], [place] - [address] ?[address] ?[place] [address] ?Phone: [phone] Fax: [phone] ? ? ? ? ?Social  Determinants of Health (SDOH) Interventions ?  ? ?Readmission Risk Interventions ?Readmission Risk Prevention Plan 07/01/2020 08/31/2019  ?Transportation Screening Complete Complete  ?PCP or Specialist Appt within 5-7 Days - Not Complete  ?Not Complete comments - SNF MD will follow up  ?PCP or Specialist Appt within 3-5 Days Complete -  ?Home Care Screening - Not Complete  ?Home Care Screening Not Completed Comments - Going to SNF  ?Medication Review (RN CM) - Complete  ?HRI or Home Care Consult Complete -  ?Social Work Consult for Recovery Care Planning/Counseling Complete -  ?Palliative Care Screening Complete -  ?Medication  Review Oceanographer(RN Care Manager) Complete -  ?Some recent data might be hidden  ? ? ? ?

## 2022-02-14 NOTE — ED Provider Notes (Signed)
[facility] Provider Note   CSN: [id number] Arrival date & time: 02/14/22  [id number]     History  Chief Complaint  Patient presents with   Marletta LorFall    Kristine Roberts is a 66 y.o. female.  The history is provided by the EMS personnel and medical records.  Fall Kristine BrunsMarie P Roberts is a 66 y.o. female who presents to the Emergency Department complaining of fall. Level V caveat due to AMS.  She presents to the ED for evaluation of injuries following a fall with AMS.  She was last seen well at 7pm by SO when he went to bed.  When he woke at 1am to use the bathroom she was found on the ground unresponsive.       Home Medications Prior to Admission medications   Medication Sig Start Date End Date Taking? Authorizing Provider  baclofen (LIORESAL) 10 MG tablet Take 10 mg by mouth at bedtime.    [provider]  famotidine (PEPCID) 20 MG tablet Take 20 mg by mouth daily.    [provider]  furosemide (LASIX) 20 MG tablet Take 20 mg by mouth daily. 09/09/21   [provider]  Multiple Vitamin (MULTIVITAMIN WITH MINERALS) TABS tablet Take 1 tablet by mouth daily. Multivitamin for Women 50+    [provider]  oxyCODONE (OXY IR/ROXICODONE) 5 MG immediate release tablet Take 1 tablet (5 mg total) by mouth every 8 (eight) hours as needed for moderate pain. 12/14/21   Lurene ShadowAyiku, Bernard, MD  sertraline (ZOLOFT) 25 MG tablet Take 25 mg by mouth daily. 08/21/21   [provider]  sulfamethoxazole-trimethoprim (BACTRIM DS) 800-160 MG tablet Take 1 tablet by mouth 2 (two) times daily. 12/29/21   Marcine Matarahlstedt, Stephen, MD  Tetrahydrozoline HCl (VISINE OP) Place 1 drop into both eyes every other day.     [provider]  traZODone (DESYREL) 100 MG tablet Take 100 mg by mouth at bedtime.    [provider]      Allergies    Penicillins    Review of Systems   Review of Systems  All other systems reviewed and are  negative.  Physical Exam Updated Vital Signs BP (!) 128/102    Pulse (!) 54    Temp (!) 96.4 F (35.8 C) (Rectal)    Resp (!) 24    SpO2 100%  Physical Exam Vitals and nursing note reviewed.  Constitutional:      Appearance: She is well-developed.     Comments: unresponsive  HENT:     Head: Normocephalic and atraumatic.  Cardiovascular:     Rate and Rhythm: Regular rhythm. Bradycardia present.     Heart sounds: No murmur heard. Pulmonary:     Effort: Pulmonary effort is normal. No respiratory distress.     Comments: Decreased air movement bilaterally Abdominal:     Palpations: Abdomen is soft.     Tenderness: There is no abdominal tenderness. There is no guarding or rebound.  Musculoskeletal:        General: No tenderness.  Skin:    General: Skin is warm and dry.  Neurological:     Comments: Opens eyes to painful stimuli.  Moans.  MAE to painful stimuli  Psychiatric:     Comments: Unable to assess.      ED Results / Procedures / Treatments   Labs (all labs ordered are listed, but only abnormal results are displayed) Labs Reviewed  COMPREHENSIVE METABOLIC PANEL - Abnormal; Notable for  the following components:      Result Value   Potassium 2.9 (*)    Calcium 8.4 (*)    Total Protein 5.8 (*)    Albumin 2.7 (*)    Total Bilirubin 0.1 (*)    All other components within normal limits  CBC WITH DIFFERENTIAL/PLATELET - Abnormal; Notable for the following components:   Hemoglobin 11.1 (*)    MCHC 29.7 (*)    RDW 17.7 (*)    All other components within normal limits  ACETAMINOPHEN LEVEL - Abnormal; Notable for the following components:   Acetaminophen (Tylenol), Serum <10 (*)    All other components within normal limits  URINALYSIS, ROUTINE W REFLEX MICROSCOPIC - Abnormal; Notable for the following components:   APPearance CLOUDY (*)    Hgb urine dipstick MODERATE (*)    Leukocytes,Ua LARGE (*)    WBC, UA >50 (*)    Bacteria, UA FEW (*)    Non Squamous Epithelial 0-5  (*)    All other components within normal limits  I-STAT VENOUS BLOOD GAS, ED - Abnormal; Notable for the following components:   pO2, Ven 27 (*)    Potassium 3.0 (*)    Calcium, Ion 1.12 (*)    All other components within normal limits  I-STAT CHEM 8, ED - Abnormal; Notable for the following components:   Potassium 3.0 (*)    Calcium, Ion 1.10 (*)    All other components within normal limits  TROPONIN I (HIGH SENSITIVITY) - Abnormal; Notable for the following components:   Troponin I (High Sensitivity) 49 (*)    All other components within normal limits  TROPONIN I (HIGH SENSITIVITY) - Abnormal; Notable for the following components:   Troponin I (High Sensitivity) 43 (*)    All other components within normal limits  RESP PANEL BY RT-PCR (FLU A&B, COVID) ARPGX2  URINE CULTURE  ETHANOL  MAGNESIUM  CBG MONITORING, ED    EKG EKG Interpretation  Date/Time:  Sunday February 14 2022 01:27:08 EST Ventricular Rate:  43 PR Interval:  144 QRS Duration: 147 QT Interval:  506 QTC Calculation: 428 R Axis:   151 Text Interpretation: Sinus bradycardia Right bundle branch block Confirmed by Tilden Fossa [id number]) on 02/14/2022 1:40:23 AM  Radiology CT Head Wo Contrast  Result Date: 02/14/2022 CLINICAL DATA:  Minor head trauma.  Patient was found down. EXAM: CT HEAD WITHOUT CONTRAST TECHNIQUE: Contiguous axial images were obtained from the base of the skull through the vertex without intravenous contrast. RADIATION DOSE REDUCTION: This exam was performed according to the departmental dose-optimization program which includes automated exposure control, adjustment of the mA and/or kV according to patient size and/or use of iterative reconstruction technique. COMPARISON:  08/31/2021 FINDINGS: Brain: No evidence of acute infarction, hemorrhage, hydrocephalus, extra-axial collection or mass lesion/mass effect. Mild cerebral atrophy. Vascular: Moderate intracranial arterial vascular calcification.  Skull: Calvarium appears intact. Sinuses/Orbits: Paranasal sinuses and mastoid air cells are clear. Other: None. IMPRESSION: No acute intracranial abnormalities.  Mild chronic cerebral atrophy. Electronically Signed   By: Burman Nieves M.D.   On: 02/14/2022 03:10   CT Cervical Spine Wo Contrast  Result Date: 02/14/2022 CLINICAL DATA:  Neck trauma.  Patient found down. EXAM: CT CERVICAL SPINE WITHOUT CONTRAST TECHNIQUE: Multidetector CT imaging of the cervical spine was performed without intravenous contrast. Multiplanar CT image reconstructions were also generated. RADIATION DOSE REDUCTION: This exam was performed according to the departmental dose-optimization program which includes automated exposure control, adjustment of the mA and/or kV according  to patient size and/or use of iterative reconstruction technique. COMPARISON:  None. FINDINGS: Alignment: Normal alignment. Skull base and vertebrae: Skull base appears intact. No vertebral compression deformities. No focal bone lesion or bone destruction. Soft tissues and spinal canal: No prevertebral soft tissue swelling. No abnormal paraspinal soft tissue mass or infiltration. Vascular calcifications. Disc levels: Degenerative changes with disc space narrowing and endplate osteophyte formation most prominent at C5-6 level. Upper chest: Emphysematous changes in the lung apices. Visualized esophagus is dilated and gas filled, possibly dysmotility or distal stricture. Other: None. IMPRESSION: Normal alignment. Degenerative changes. No acute displaced fractures identified. Incidental note of a dilated esophagus. Indeterminate etiology. Electronically Signed   By: Burman Nieves M.D.   On: 02/14/2022 03:13   DG Pelvis Portable  Result Date: 02/14/2022 CLINICAL DATA:  Altered mental status and fall. EXAM: PORTABLE PELVIS 1-2 VIEWS COMPARISON:  CT 01/01/2022 FINDINGS: Comminuted fractures of the right superior and inferior pubic rami, unchanged since prior CT.  Old fracture deformity of the inter trochanteric right hip post internal fixation, also unchanged. Internal fixation also of the left hip. No acute displaced fractures are identified. SI joints and symphysis pubis appear intact. IMPRESSION: No acute bony abnormalities. Old fracture deformities of the right pubic rami and bilateral hips. Electronically Signed   By: Burman Nieves M.D.   On: 02/14/2022 01:53   DG Chest Port 1 View  Result Date: 02/14/2022 CLINICAL DATA:  Altered mentation, fall injury. EXAM: PORTABLE CHEST 1 VIEW COMPARISON:  CT 08/31/2021 FINDINGS: The cardiac size is normal. There is aortic atherosclerosis and tortuosity with stable mediastinum. The lungs are clear of infiltrates. There are calcified granulomas in the left lower lobe. No pleural effusion is seen. Mild thoracic dextroscoliosis and osteopenia. Multiple chronic fractures of the posterior ribcage are again shown. Multiple overlying monitor wires. IMPRESSION: No evidence of acute chest process or interval changes. Aortic atherosclerosis. Electronically Signed   By: Almira Bar M.D.   On: 02/14/2022 01:58    Procedures Procedures    Medications Ordered in ED Medications  furosemide (LASIX) tablet 20 mg (has no administration in time range)  famotidine (PEPCID) tablet 20 mg (has no administration in time range)  multivitamin with minerals tablet 1 tablet (has no administration in time range)  0.9 %  sodium chloride infusion ( Intravenous New Bag/Given 02/14/22 0610)  acetaminophen (TYLENOL) tablet 650 mg (has no administration in time range)    Or  acetaminophen (TYLENOL) suppository 650 mg (has no administration in time range)  traZODone (DESYREL) tablet 25 mg (has no administration in time range)  magnesium hydroxide (MILK OF MAGNESIA) suspension 30 mL (has no administration in time range)  ondansetron (ZOFRAN) tablet 4 mg (has no administration in time range)    Or  ondansetron (ZOFRAN) injection 4 mg (has no  administration in time range)  cefTRIAXone (ROCEPHIN) 1 g in sodium chloride 0.9 % 100 mL IVPB (has no administration in time range)  enoxaparin (LOVENOX) 100 mg/mL injection 20 mg (has no administration in time range)  potassium chloride (KLOR-CON) packet 40 mEq (has no administration in time range)  potassium chloride (KLOR-CON) packet 40 mEq (has no administration in time range)  naloxone (NARCAN) 0.4 MG/ML injection (0.1 mg  Given 02/14/22 0126)  sodium chloride 0.9 % bolus 500 mL (0 mLs Intravenous Stopped 02/14/22 0342)  cefTRIAXone (ROCEPHIN) 1 g in sodium chloride 0.9 % 100 mL IVPB (0 g Intravenous Stopped 02/14/22 0725)    ED Course/ Medical Decision Making/ A&P  Medical Decision Making Amount and/or Complexity of Data Reviewed Labs: ordered. Radiology: ordered.  Risk Decision regarding hospitalization.   Patient here for evaluation after being found down on the ground.  Patient minimally responsive on ED presentation.  She was treated with Narcan with improvement of her mental status but she was not able to fully articulate what occurred.  Her heart rate did improve from 40s to 50s after Narcan.  EKG with sinus bradycardia.  She is observed for many hours in the emergency department.  Her mental status did gradually improved to the point that she was able to states she slipped.  She does continue to be confused and drowsy.  UA is concerning for UTI, will send culture and start on antibiotics.  Given her persistent bradycardia, confusion and UTI plan to admit for further work-up.  Medicine consulted for admission.  Patient updated findings of studies and she is in agreement with plan.        Final Clinical Impression(s) / ED Diagnoses Final diagnoses:  Fall, initial encounter  Somnolence  Acute UTI    Rx / DC Orders ED Discharge Orders     None         Tilden Fossa, MD 02/14/22 [phone]

## 2022-02-14 NOTE — Assessment & Plan Note (Signed)
-   Potassium will be repleted and magnesium level will be checked. ?

## 2022-02-14 NOTE — ED Notes (Signed)
Patient transported to MRI 

## 2022-02-14 NOTE — ED Notes (Signed)
Bladder scan by NT revealed 620 ml. MD placed order for in/out ?

## 2022-02-14 NOTE — Assessment & Plan Note (Addendum)
-   The patient had unresponsiveness and possibly syncope. ?- The patient be admitted to a medical telemetry bed. ?- We will follow neurochecks every 4 hours for 24 hours. ?- This is likely multifactorial.  Differential diagnoses would include opiate accidental unintentional overdose and UTI and possibly both as well as marked sinus bradycardia. ?- We will avoid sedation. ?- We will need to rule out TIA/evolving CVA. ?- She will be placed on as needed Narcan I will continue her on IV Rocephin for UTI. ?- We will check orthostatics. ?- We will monitor her for arrhythmias. ?

## 2022-02-14 NOTE — ED Notes (Signed)
Lunch tray at the bedside 

## 2022-02-14 NOTE — ED Triage Notes (Addendum)
Pt BIB [facility] from home, pt LSN 7pm tonight, he got up to use the bathroom and found on the floor. Unknown how long she has been there. Per EMS, pt alert to verbal, left pupil larger than the right, but equal on arrival to ED. Recent hx pelvis fx. HR 40s. ?

## 2022-02-14 NOTE — ED Notes (Incomplete)
Pt

## 2022-02-14 NOTE — ED Notes (Signed)
Pt stated she wanted to eat breakfast. When RN attempted to feed pt, pt declined and closed her eyes. ?

## 2022-02-14 NOTE — ED Notes (Signed)
Pt more responsive after narcan.  

## 2022-02-14 NOTE — Assessment & Plan Note (Signed)
-   We will continue H2 blocker therapy. 

## 2022-02-14 NOTE — H&P (Signed)
[place]   PATIENT NAME: Kristine Roberts    MR#:  [id number]  DATE OF BIRTH:  1956-09-19  DATE OF ADMISSION:  02/14/2022  PRIMARY CARE PHYSICIAN: Kirstie Peri, MD   Patient is coming from: Home  REQUESTING/REFERRING PHYSICIAN: Tilden Fossa, MD   CHIEF COMPLAINT:   Chief Complaint  Patient presents with   Fall    HISTORY OF PRESENT ILLNESS:  Kristine Roberts is a 66 y.o. Caucasian female with medical history significant for COPD, chronic pain, GERD, PUD and hiatal hernia, who presented to the ER with acute onset of fall.  The patient was last seen normal at her baseline around 7 PM by which time her husband put to bed.  When he woke up at 8 AM to go to the bathroom he tripped over her.  She was found on the ground unresponsive.  She was given 0.4 mg of Narcan by EMS with some help.  She was still very somnolent during the interview but arousable.  She was still confused and arousable alert and oriented x2 to place and person.  No chest pain or dyspnea or palpitation, no cough or wheezing hemoptysis were reported.  No nausea or vomiting or abdominal pain.  She was overall a very poor historian and she was very somnolent and unarousable she would easily fall asleep.  ED Course: When she came to the ER temperature was 95.9 blood pressure 160/31 with a heart rate of 45 and later 50.  It has been as low as 32.  Labs revealed a possible UA for UTI.  Her VBG showed pH 7.349 and HCO3 26.2 CMP was markable for hypokalemia of 2.9 and albumin of 2.7 with total protein of 5.8 and prealbumin of 0.1 with unremarkable CMP.  High-sensitivity troponin I was 49 and later 43 EKG showed hemoglobin 11.1 and hematocrit 37.4 compared to 12.2 and 36 earlier.  Blood glucose was 81 later 89. EKG as reviewed by me : EKG showed sinus bradycardia with a rate of 43 with right bundle branch block. Imaging: Ultrasound showed aortic atherosclerosis with no acute cardiopulmonary disease.  Pelvic x-ray showed  old fracture deformities of the right  rami and bilateral hips.  Head CT without contrast showed mild chronic cerebral atrophy with no acute intracranial normalities.  C-spine CT showed normal alignment with degenerative changes with no acute displaced fractures identified.  Indeterminate dilated esophagus was noted.  The patient was given 500 mill IV normal saline bolus and 1 g of IV Rocephin.  She will be admitted to a medical telemetry bed for further evaluation and management. PAST MEDICAL HISTORY:   Past Medical History:  Diagnosis Date   COPD (chronic obstructive pulmonary disease) (HCC)    Dysphagia    Dysrhythmia    Esophagitis    Gastric outlet obstruction    Gastric stenosis    GERD (gastroesophageal reflux disease)    Hiatal hernia    Osteoporosis    PUD (peptic ulcer disease)    Wounds, multiple     PAST SURGICAL HISTORY:   Past Surgical History:  Procedure Laterality Date   APPENDECTOMY     BALLOON DILATION N/A 03/05/2016   Procedure: BALLOON DILATION;  Surgeon: Malissa Hippo, MD;  [facility];  Service: Endoscopy;  Laterality: N/A;  pyloric channel dialtaion   BALLOON DILATION N/A 07/14/2018   Procedure: BALLOON DILATION;  Surgeon: Lynann Bologna, MD;  Location[facility]COPY;  Service: Endoscopy;  Laterality: N/A;   BALLOON  DILATION  10/03/2020   Procedure: BALLOON DILATION;  Surgeon: Marguerita Merlesastaneda Mayorga, Reuel Boomaniel, MD;  [facility];  Service: Gastroenterology;;   Marvis RepressBALLOON DILATION  12/19/2020   Procedure: Rubye BeachBALLOON DILATION;  Surgeon: Marguerita Merlesastaneda Mayorga, Reuel Boomaniel, MD;  [facility];  Service: Gastroenterology;;   BIOPSY  07/14/2018   Procedure: BIOPSY;  Surgeon: Lynann BolognaGupta, Rajesh, MD;  [facility];  Service: Endoscopy;;   BIOPSY  07/03/2020   Procedure: BIOPSY;  Surgeon: Shellia Cleverlyirigliano, Vito V, DO;  Location: [facility];  Service: Gastroenterology;;   CHOLECYSTECTOMY     COLONOSCOPY WITH PROPOFOL N/A 08/01/2020   Procedure: COLONOSCOPY WITH  PROPOFOL;  Surgeon: Dolores Frameastaneda Mayorga, Daniel, MD;  [facility];  Service: Gastroenterology;  Laterality: N/A;  1130   ESOPHAGEAL DILATION N/A 03/03/2016   Procedure: ESOPHAGEAL DILATION;  Surgeon: Malissa HippoNajeeb U Rehman, MD;  [facility];  Service: Endoscopy;  Laterality: N/A;   ESOPHAGEAL DILATION N/A 05/07/2016   Procedure: ESOPHAGEAL DILATION;  Surgeon: Malissa HippoNajeeb U Rehman, MD;  [facility];  Service: Endoscopy;  Laterality: N/A;   ESOPHAGEAL DILATION  01/26/2018   Procedure: DILATION OF ANASTOMOTIC  STRICTURE;  Surgeon: Malissa Hippoehman, Najeeb U, MD;  [facility];  Service: Endoscopy;;   ESOPHAGEAL DILATION N/A 08/01/2020   Procedure: ESOPHAGEAL DILATION;  Surgeon: Dolores Frameastaneda Mayorga, Daniel, MD;  [facility];  Service: Gastroenterology;  Laterality: N/A;   ESOPHAGOGASTRODUODENOSCOPY N/A 03/03/2016   Procedure: ESOPHAGOGASTRODUODENOSCOPY (EGD);  Surgeon: Malissa HippoNajeeb U Rehman, MD;  [facility];  Service: Endoscopy;  Laterality: N/A;  2:00   ESOPHAGOGASTRODUODENOSCOPY N/A 05/07/2016   Procedure: ESOPHAGOGASTRODUODENOSCOPY (EGD);  Surgeon: Malissa HippoNajeeb U Rehman, MD;  [facility];  Service: Endoscopy;  Laterality: N/A;  855 - moved to 6/2 @ 10:15 - Ann notified pt   ESOPHAGOGASTRODUODENOSCOPY N/A 12/28/2017   Procedure: ESOPHAGOGASTRODUODENOSCOPY (EGD) with stricture dilation;  Surgeon: Malissa Hippoehman, Najeeb U, MD;  [facility];  Service: Endoscopy;  Laterality: N/A;   ESOPHAGOGASTRODUODENOSCOPY N/A 01/26/2018   Procedure: ESOPHAGOGASTRODUODENOSCOPY (EGD);  Surgeon: Malissa Hippoehman, Najeeb U, MD;  [facility];  Service: Endoscopy;  Laterality: N/A;  255   ESOPHAGOGASTRODUODENOSCOPY N/A 07/14/2018   Procedure: ESOPHAGOGASTRODUODENOSCOPY (EGD);  Surgeon: Lynann BolognaGupta, Rajesh, MD;  [facility];  Service: Endoscopy;  Laterality: N/A;   ESOPHAGOGASTRODUODENOSCOPY (EGD) WITH PROPOFOL N/A 03/05/2016   Procedure: ESOPHAGOGASTRODUODENOSCOPY (EGD) WITH  PROPOFOL Anastomotic stricture dilation ;  Surgeon: Malissa HippoNajeeb U Rehman, MD;  [facility];  Service: Endoscopy;  Laterality: N/A;  to be done in OR under fluoro   ESOPHAGOGASTRODUODENOSCOPY (EGD) WITH PROPOFOL N/A 08/22/2019   Procedure: ESOPHAGOGASTRODUODENOSCOPY (EGD) WITH PROPOFOL With anastomotic stricture dilation.;  Surgeon: Malissa Hippoehman, Najeeb U, MD;  [facility];  Service: Endoscopy;  Laterality: N/A;   ESOPHAGOGASTRODUODENOSCOPY (EGD) WITH PROPOFOL N/A 08/24/2019   Procedure: ESOPHAGOGASTRODUODENOSCOPY (EGD) WITH PROPOFOL with stricture dilation;  Surgeon: Malissa Hippoehman, Najeeb U, MD;  [facility];  Service: Endoscopy;  Laterality: N/A;   ESOPHAGOGASTRODUODENOSCOPY (EGD) WITH PROPOFOL N/A 07/03/2020   Procedure: ESOPHAGOGASTRODUODENOSCOPY (EGD) WITH PROPOFOL;  Surgeon: Shellia Cleverlyirigliano, Vito V, DO;  [facility]: [facility];  Service: Gastroenterology;  Laterality: N/A;   ESOPHAGOGASTRODUODENOSCOPY (EGD) WITH PROPOFOL N/A 08/01/2020   Procedure: ESOPHAGOGASTRODUODENOSCOPY (EGD) WITH PROPOFOL;  Surgeon: Dolores Frameastaneda Mayorga, Daniel, MD;  [facility];  Service: Gastroenterology;  Laterality: N/A;   ESOPHAGOGASTRODUODENOSCOPY (EGD) WITH PROPOFOL N/A 08/29/2020   Procedure: ESOPHAGOGASTRODUODENOSCOPY (EGD) WITH PROPOFOL;  Surgeon: Dolores Frameastaneda Mayorga, Daniel, MD;  [facility];  Service: Gastroenterology;  Laterality: N/A;  10:00   ESOPHAGOGASTRODUODENOSCOPY (EGD) WITH PROPOFOL N/A 10/03/2020   Procedure: ESOPHAGOGASTRODUODENOSCOPY (EGD) WITH PROPOFOL;  Surgeon: Dolores Frame, MD;  [facility];  Service: Gastroenterology;  Laterality: N/A;  200   ESOPHAGOGASTRODUODENOSCOPY (EGD) WITH PROPOFOL N/A 12/19/2020   Procedure: ESOPHAGOGASTRODUODENOSCOPY (EGD) WITH PROPOFOL;  Surgeon: Dolores Frame, MD;  [facility];  Service: Gastroenterology;  Laterality: N/A;  830   FOREIGN BODY REMOVAL  07/03/2020   Procedure: FOREIGN BODY  REMOVAL;  Surgeon: Shellia Cleverly, DO;  Location: [facility];  Service: Gastroenterology;;   HIP SURGERY Right 09/2020   HIP SURGERY Left 07/2020   INTRAMEDULLARY (IM) NAIL INTERTROCHANTERIC Right 07/09/2018   Procedure: INTRAMEDULLARY (IM) NAIL INTERTROCHANTRIC;  Surgeon: Yolonda Kida, MD;  Location: [facility];  Service: Orthopedics;  Laterality: Right;   INTRAMEDULLARY (IM) NAIL INTERTROCHANTERIC Left 06/27/2020   Procedure: INTRAMEDULLARY (IM) NAIL INTERTROCHANTRIC;  Surgeon: Yolonda Kida, MD;  [facility];  Service: Orthopedics;  Laterality: Left;   KENALOG INJECTION  07/03/2020   Procedure: KENALOG INJECTION;  Surgeon: Shellia Cleverly, DO;  Location: [facility];  Service: Gastroenterology;;   STOMACH SURGERY     TOTAL HIP ARTHROPLASTY Bilateral     SOCIAL HISTORY:   Social History   Tobacco Use   Smoking status: Every Day    Packs/day: 1.50    Years: 20.00    Pack years: 30.00    Types: Cigarettes   Smokeless tobacco: Never  Substance Use Topics   Alcohol use: Not Currently    Alcohol/week: 12.0 standard drinks    Types: 12 Cans of beer per week    Comment: weekly    FAMILY HISTORY:   Family History  Problem Relation Age of Onset   Alzheimer's disease Mother    COPD Mother    Throat cancer Father    Breast cancer Sister     DRUG ALLERGIES:   Allergies  Allergen Reactions   Penicillins Itching, Swelling and Other (See Comments)    Tongue swells Has patient had a PCN reaction causing immediate rash, facial/tongue/throat swelling, SOB or lightheadedness with hypotension: Yes, had a rash and tongue swelled up. No SOB and lightheadedness. Has patient had a PCN reaction causing severe rash involving mucus membranes or skin necrosis: No Has patient had a PCN reaction that required hospitalization: No Has patient had a PCN reaction occurring within the last 10 years: No If all of the above answers are "NO", then may procee    REVIEW OF  SYSTEMS:   ROS As per history of present illness, otherwise unobtainable due to the patient's altered mental status.  MEDICATIONS AT HOME:   Prior to Admission medications   Medication Sig Start Date End Date Taking? Authorizing Provider  baclofen (LIORESAL) 10 MG tablet Take 10 mg by mouth at bedtime.    [provider]  famotidine (PEPCID) 20 MG tablet Take 20 mg by mouth daily.    [provider]  furosemide (LASIX) 20 MG tablet Take 20 mg by mouth daily. 09/09/21   [provider]  Multiple Vitamin (MULTIVITAMIN WITH MINERALS) TABS tablet Take 1 tablet by mouth daily. Multivitamin for Women 50+    [provider]  oxyCODONE (OXY IR/ROXICODONE) 5 MG immediate release tablet Take 1 tablet (5 mg total) by mouth every 8 (eight) hours as needed for moderate pain. 12/14/21   Lurene Shadow, MD  sertraline (ZOLOFT) 25 MG tablet Take 25 mg by mouth daily. 08/21/21  [provider]  sulfamethoxazole-trimethoprim (BACTRIM DS) 800-160 MG tablet Take 1 tablet by mouth 2 (two) times daily. 12/29/21   Marcine Matar, MD  Tetrahydrozoline HCl (VISINE OP) Place 1 drop into both eyes every other day.     [provider]  traZODone (DESYREL) 100 MG tablet Take 100 mg by mouth at bedtime.    [provider]      VITAL SIGNS:  Blood pressure 106/61, pulse (!) 43, temperature (!) 96.4 F (35.8 C), temperature source Rectal, resp. rate (!) 9, SpO2 99 %.  PHYSICAL EXAMINATION:  Physical Exam  GENERAL:  66 y.o.-year-old Caucasian Caucasian female patient lying in the bed with no acute distress.  She is fairly somnolent but arousable. EYES: Pupils equal, round, reactive to light and accommodation. No scleral icterus. Extraocular muscles intact.  HEENT: Head atraumatic, normocephalic. Oropharynx and nasopharynx clear.  NECK:  Supple, no jugular venous distention. No thyroid enlargement, no tenderness.  LUNGS: Normal breath sounds bilaterally,  no wheezing, rales,rhonchi or crepitation. No use of accessory muscles of respiration.  CARDIOVASCULAR: Regular bradycardia rate and rhythm, S1, S2 normal. No murmurs, rubs, or gallops.  ABDOMEN: Soft, nondistended, nontender. Bowel sounds present. No organomegaly or mass.  EXTREMITIES: No pedal edema, cyanosis, or clubbing.  NEUROLOGIC: Cranial nerves II through XII are intact. Muscle strength 5/5 in all extremities. Sensation intact. Gait not checked.  PSYCHIATRIC: The patient is alert and oriented x 2 to place and person but not to time.  No good eye contact. SKIN: No obvious rash, lesion, or ulcer.   LABORATORY PANEL:   CBC Recent Labs  Lab 02/14/22 0133  WBC 7.0  HGB 11.1*  HCT 37.4  PLT 321   ------------------------------------------------------------------------------------------------------------------  Chemistries  Recent Labs  Lab 02/14/22 0133  NA 139  K 2.9*  CL 105  CO2 25  GLUCOSE 89  BUN 12  CREATININE 0.64  CALCIUM 8.4*  AST 18  ALT 8  ALKPHOS 89  BILITOT 0.1*   ------------------------------------------------------------------------------------------------------------------  Cardiac Enzymes No results for input(s): TROPONINI in the last 168 hours. ------------------------------------------------------------------------------------------------------------------  RADIOLOGY:  CT Head Wo Contrast  Result Date: 02/14/2022 CLINICAL DATA:  Minor head trauma.  Patient was found down. EXAM: CT HEAD WITHOUT CONTRAST TECHNIQUE: Contiguous axial images were obtained from the base of the skull through the vertex without intravenous contrast. RADIATION DOSE REDUCTION: This exam was performed according to the departmental dose-optimization program which includes automated exposure control, adjustment of the mA and/or kV according to patient size and/or use of iterative reconstruction technique. COMPARISON:  08/31/2021 FINDINGS: Brain: No evidence of acute infarction,  hemorrhage, hydrocephalus, extra-axial collection or mass lesion/mass effect. Mild cerebral atrophy. Vascular: Moderate intracranial arterial vascular calcification. Skull: Calvarium appears intact. Sinuses/Orbits: Paranasal sinuses and mastoid air cells are clear. Other: None. IMPRESSION: No acute intracranial abnormalities.  Mild chronic cerebral atrophy. Electronically Signed   By: Burman Nieves M.D.   On: 02/14/2022 03:10   CT Cervical Spine Wo Contrast  Result Date: 02/14/2022 CLINICAL DATA:  Neck trauma.  Patient found down. EXAM: CT CERVICAL SPINE WITHOUT CONTRAST TECHNIQUE: Multidetector CT imaging of the cervical spine was performed without intravenous contrast. Multiplanar CT image reconstructions were also generated. RADIATION DOSE REDUCTION: This exam was performed according to the departmental dose-optimization program which includes automated exposure control, adjustment of the mA and/or kV according to patient size and/or use of iterative reconstruction technique. COMPARISON:  None. FINDINGS: Alignment: Normal alignment. Skull base and vertebrae: Skull base appears intact. No vertebral compression  deformities. No focal bone lesion or bone destruction. Soft tissues and spinal canal: No prevertebral soft tissue swelling. No abnormal paraspinal soft tissue mass or infiltration. Vascular calcifications. Disc levels: Degenerative changes with disc space narrowing and endplate osteophyte formation most prominent at C5-6 level. Upper chest: Emphysematous changes in the lung apices. Visualized esophagus is dilated and gas filled, possibly dysmotility or distal stricture. Other: None. IMPRESSION: Normal alignment. Degenerative changes. No acute displaced fractures identified. Incidental note of a dilated esophagus. Indeterminate etiology. Electronically Signed   By: Burman Nieves M.D.   On: 02/14/2022 03:13   DG Pelvis Portable  Result Date: 02/14/2022 CLINICAL DATA:  Altered mental status and  fall. EXAM: PORTABLE PELVIS 1-2 VIEWS COMPARISON:  CT 01/01/2022 FINDINGS: Comminuted fractures of the right superior and inferior pubic rami, unchanged since prior CT. Old fracture deformity of the inter trochanteric right hip post internal fixation, also unchanged. Internal fixation also of the left hip. No acute displaced fractures are identified. SI joints and symphysis pubis appear intact. IMPRESSION: No acute bony abnormalities. Old fracture deformities of the right pubic rami and bilateral hips. Electronically Signed   By: Burman Nieves M.D.   On: 02/14/2022 01:53   DG Chest Port 1 View  Result Date: 02/14/2022 CLINICAL DATA:  Altered mentation, fall injury. EXAM: PORTABLE CHEST 1 VIEW COMPARISON:  CT 08/31/2021 FINDINGS: The cardiac size is normal. There is aortic atherosclerosis and tortuosity with stable mediastinum. The lungs are clear of infiltrates. There are calcified granulomas in the left lower lobe. No pleural effusion is seen. Mild thoracic dextroscoliosis and osteopenia. Multiple chronic fractures of the posterior ribcage are again shown. Multiple overlying monitor wires. IMPRESSION: No evidence of acute chest process or interval changes. Aortic atherosclerosis. Electronically Signed   By: Almira Bar M.D.   On: 02/14/2022 01:58      IMPRESSION AND PLAN:  Assessment and Plan: * Altered mental status - The patient had unresponsiveness and possibly syncope. - The patient be admitted to a medical telemetry bed. - We will follow neurochecks every 4 hours for 24 hours. - This is likely multifactorial.  Differential diagnoses would include opiate accidental unintentional overdose and UTI and possibly both as well as marked sinus bradycardia. - We will avoid sedation. - We will need to rule out TIA/evolving CVA. - She will be placed on as needed Narcan I will continue her on IV Rocephin for UTI. - We will check orthostatics. - We will monitor her for arrhythmias.  Acute lower  UTI - This is likely the main culprit for her altered status. - She will be placed on IV Rocephin and will follow urine culture and sensitivity.  Sinus bradycardia - This could be contributing to her altered mental status and syncope. - She was placed on as needed IV atropine. - 2D echo for further assessment of her unresponsiveness and possible syncope will be obtained as well as cardiology consult. - I discussed her case with [facility] fellow Dr. Welton Flakes.  GERD without esophagitis - We will continue H2 blocker therapy.  Hypokalemia - Potassium will be repleted and magnesium level will be checked.  Depression - We will briefly hold off Zoloft and avoid sedatives.  COPD (chronic obstructive pulmonary disease) (HCC) - We will continue Dulera and as needed albuterol.    DVT prophylaxis: Lovenox.  Advanced Care Planning:  Code Status: full code.  Family Communication:  The plan of care was discussed in details with the patient (and family). I answered all questions. The  patient agreed to proceed with the above mentioned plan. Further management will depend upon hospital course. Disposition Plan: Back to previous home environment Consults called: Cardiology. All the records are reviewed and case discussed with ED provider.  Status is: Inpatient   At the time of the admission, it appears that the appropriate admission status for this patient is inpatient.  This is judged to be reasonable and necessary in order to provide the required intensity of service to ensure the patient's safety given the presenting symptoms, physical exam findings and initial radiographic and laboratory data in the context of comorbid conditions.  The patient requires inpatient status due to high intensity of service, high risk of further deterioration and high frequency of surveillance required.  I certify that at the time of admission, it is my clinical judgment that the patient will require inpatient hospital care  extending more than 2 midnights.                            Dispo: The patient is from: Home              Anticipated d/c is to: Home              Patient currently is not medically stable to d/c.              Difficult to place patient: No  Hannah Beat M.D on 02/14/2022 at 6:27 AM  [facility]   From 7 PM-7 AM, contact night-coverage [email or web address]  CC: Primary care physician; Kirstie Peri, MD

## 2022-02-14 NOTE — ED Notes (Signed)
Pt is laying in bed looking around. ?

## 2022-02-14 NOTE — Progress Notes (Signed)
Kristine Roberts is a 66 y.o. Caucasian female with medical history significant for COPD, chronic pain, GERD, PUD and hiatal hernia, who presented to the ER with acute onset of fall and acute encephalopathy probably secondary to UTI.  ? ?So far work up for stroke has been negative.  ?She was started on IV antibiotics , with IV rocephin.  ?Continue to monitor.  ? ? ?Hosie Poisson, MD ? ? ?

## 2022-02-14 NOTE — ED Notes (Signed)
Pt nephew called but pt would not respond. RN asked if she want to speak and pt shook head no. ?

## 2022-02-14 NOTE — ED Notes (Signed)
Placed pt charger in pt belonging bag ?

## 2022-02-14 NOTE — ED Notes (Signed)
0.1mg  narcan IV given per verbal order Dr. Madilyn Hookees. ?

## 2022-02-14 NOTE — Assessment & Plan Note (Signed)
-   We will continue Dulera and as needed albuterol. ?

## 2022-02-14 NOTE — ED Notes (Signed)
RN informed the pt she was going MRI. Pt was able to tell me her name and dob. She wasn't able to state where she was but pt seemed more alert.  ?

## 2022-02-14 NOTE — ED Notes (Addendum)
RN cleaned pt ear and will notify IP RN to keep an eye on the ear to see if it continues to bleed.  ? ?While cleaning the pt ear RN didn't notice any external injury. When wiping the blood from ear it seemed to have clotted some and wiped out smoothly. ?

## 2022-02-14 NOTE — Assessment & Plan Note (Addendum)
-   This is likely the main culprit for her altered status. ?- She will be placed on IV Rocephin and will follow urine culture and sensitivity. ?

## 2022-02-14 NOTE — Assessment & Plan Note (Addendum)
-   This could be contributing to her altered mental status and syncope. ?- She was placed on as needed IV atropine. ?- 2D echo for further assessment of her unresponsiveness and possible syncope will be obtained as well as cardiology consult. ?- I discussed her case with [facility] fellow Dr. Welton Flakes. ?

## 2022-02-14 NOTE — Assessment & Plan Note (Addendum)
-   We will briefly hold off Zoloft and avoid sedatives. ?

## 2022-02-14 NOTE — ED Notes (Signed)
RN and NT in/out cath 400 ml  ?

## 2022-02-14 NOTE — Evaluation (Addendum)
Clinical/Bedside Swallow Evaluation Patient Details  Name: Kristine Roberts MRN: [id number] Date of Birth: October 17, 1956  Today's Date: 02/14/2022 Time: SLP Start Time (ACUTE ONLY): 25 SLP Stop Time (ACUTE ONLY): 1508 SLP Time Calculation (min) (ACUTE ONLY): 12 min  Past Medical History:  Past Medical History:  Diagnosis Date   COPD (chronic obstructive pulmonary disease) (HCC)    Dysphagia    Dysrhythmia    Esophagitis    Gastric outlet obstruction    Gastric stenosis    GERD (gastroesophageal reflux disease)    Hiatal hernia    Osteoporosis    PUD (peptic ulcer disease)    Wounds, multiple    Past Surgical History:  Past Surgical History:  Procedure Laterality Date   APPENDECTOMY     BALLOON DILATION N/A 03/05/2016   Procedure: BALLOON DILATION;  Surgeon: Rogene Houston, MD;  [facility];  Service: Endoscopy;  Laterality: N/A;  pyloric channel dialtaion   BALLOON DILATION N/A 07/14/2018   Procedure: BALLOON DILATION;  Surgeon: Jackquline Denmark, MD;  Location: [facility] ENDOSCOPY;  Service: Endoscopy;  Laterality: N/A;   BALLOON DILATION  10/03/2020   Procedure: BALLOON DILATION;  Surgeon: Montez Morita, Quillian Quince, MD;  [facility];  Service: Gastroenterology;;   Larrie Kass DILATION  12/19/2020   Procedure: [place];  Surgeon: Montez Morita, Quillian Quince, MD;  [facility];  Service: Gastroenterology;;   BIOPSY  07/14/2018   Procedure: BIOPSY;  Surgeon: Jackquline Denmark, MD;  Location: [facility] ENDOSCOPY;  Service: Endoscopy;;   BIOPSY  07/03/2020   Procedure: BIOPSY;  Surgeon: Lavena Bullion, DO;  Location: [place] ENDOSCOPY;  Service: Gastroenterology;;   CHOLECYSTECTOMY     COLONOSCOPY WITH PROPOFOL N/A 08/01/2020   Procedure: COLONOSCOPY WITH PROPOFOL;  Surgeon: Harvel Quale, MD;  [facility];  Service: Gastroenterology;  Laterality: N/A;  1130   ESOPHAGEAL DILATION N/A 03/03/2016   Procedure: ESOPHAGEAL DILATION;  Surgeon: Rogene Houston, MD;  [facility];  Service: Endoscopy;  Laterality: N/A;   ESOPHAGEAL DILATION N/A 05/07/2016   Procedure: ESOPHAGEAL DILATION;  Surgeon: Rogene Houston, MD;  [facility];  Service: Endoscopy;  Laterality: N/A;   ESOPHAGEAL DILATION  01/26/2018   Procedure: DILATION OF ANASTOMOTIC  STRICTURE;  Surgeon: Rogene Houston, MD;  [facility];  Service: Endoscopy;;   ESOPHAGEAL DILATION N/A 08/01/2020   Procedure: ESOPHAGEAL DILATION;  Surgeon: Harvel Quale, MD;  [facility];  Service: Gastroenterology;  Laterality: N/A;   ESOPHAGOGASTRODUODENOSCOPY N/A 03/03/2016   Procedure: ESOPHAGOGASTRODUODENOSCOPY (EGD);  Surgeon: Rogene Houston, MD;  [facility];  Service: Endoscopy;  Laterality: N/A;  2:00   ESOPHAGOGASTRODUODENOSCOPY N/A 05/07/2016   Procedure: ESOPHAGOGASTRODUODENOSCOPY (EGD);  Surgeon: Rogene Houston, MD;  [facility];  Service: Endoscopy;  Laterality: N/A;  855 - moved to 6/2 @ 10:15 - Ann notified pt   ESOPHAGOGASTRODUODENOSCOPY N/A 12/28/2017   Procedure: ESOPHAGOGASTRODUODENOSCOPY (EGD) with stricture dilation;  Surgeon: Rogene Houston, MD;  [facility];  Service: Endoscopy;  Laterality: N/A;   ESOPHAGOGASTRODUODENOSCOPY N/A 01/26/2018   Procedure: ESOPHAGOGASTRODUODENOSCOPY (EGD);  Surgeon: Rogene Houston, MD;  [facility];  Service: Endoscopy;  Laterality: N/A;  255   ESOPHAGOGASTRODUODENOSCOPY N/A 07/14/2018   Procedure: ESOPHAGOGASTRODUODENOSCOPY (EGD);  Surgeon: Jackquline Denmark, MD;  Location: [facility] ENDOSCOPY;  Service: Endoscopy;  Laterality: N/A;   ESOPHAGOGASTRODUODENOSCOPY (EGD) WITH PROPOFOL N/A 03/05/2016   Procedure: ESOPHAGOGASTRODUODENOSCOPY (EGD) WITH PROPOFOL Anastomotic  stricture dilation ;  Surgeon: Rogene Houston, MD;  [facility];  Service: Endoscopy;  Laterality: N/A;  to be done in OR under fluoro   ESOPHAGOGASTRODUODENOSCOPY (EGD) WITH PROPOFOL  N/A 08/22/2019   Procedure: ESOPHAGOGASTRODUODENOSCOPY (EGD) WITH PROPOFOL With anastomotic stricture dilation.;  Surgeon: Rogene Houston, MD;  [facility];  Service: Endoscopy;  Laterality: N/A;   ESOPHAGOGASTRODUODENOSCOPY (EGD) WITH PROPOFOL N/A 08/24/2019   Procedure: ESOPHAGOGASTRODUODENOSCOPY (EGD) WITH PROPOFOL with stricture dilation;  Surgeon: Rogene Houston, MD;  [facility];  Service: Endoscopy;  Laterality: N/A;   ESOPHAGOGASTRODUODENOSCOPY (EGD) WITH PROPOFOL N/A 07/03/2020   Procedure: ESOPHAGOGASTRODUODENOSCOPY (EGD) WITH PROPOFOL;  Surgeon: Lavena Bullion, DO;  Location: [place];  Service: Gastroenterology;  Laterality: N/A;   ESOPHAGOGASTRODUODENOSCOPY (EGD) WITH PROPOFOL N/A 08/01/2020   Procedure: ESOPHAGOGASTRODUODENOSCOPY (EGD) WITH PROPOFOL;  Surgeon: Harvel Quale, MD;  [facility];  Service: Gastroenterology;  Laterality: N/A;   ESOPHAGOGASTRODUODENOSCOPY (EGD) WITH PROPOFOL N/A 08/29/2020   Procedure: ESOPHAGOGASTRODUODENOSCOPY (EGD) WITH PROPOFOL;  Surgeon: Harvel Quale, MD;  [facility];  Service: Gastroenterology;  Laterality: N/A;  10:00   ESOPHAGOGASTRODUODENOSCOPY (EGD) WITH PROPOFOL N/A 10/03/2020   Procedure: ESOPHAGOGASTRODUODENOSCOPY (EGD) WITH PROPOFOL;  Surgeon: Harvel Quale, MD;  [facility];  Service: Gastroenterology;  Laterality: N/A;  200   ESOPHAGOGASTRODUODENOSCOPY (EGD) WITH PROPOFOL N/A 12/19/2020   Procedure: ESOPHAGOGASTRODUODENOSCOPY (EGD) WITH PROPOFOL;  Surgeon: Harvel Quale, MD;  [facility];  Service: Gastroenterology;  Laterality: N/A;  [place] BODY REMOVAL  07/03/2020   Procedure: FOREIGN BODY REMOVAL;  Surgeon: Lavena Bullion, DO;  Location: [place] ENDOSCOPY;  Service: Gastroenterology;;   HIP SURGERY Right 09/2020   HIP SURGERY Left 07/2020   INTRAMEDULLARY (IM) NAIL INTERTROCHANTERIC Right 07/09/2018    Procedure: INTRAMEDULLARY (IM) NAIL INTERTROCHANTRIC;  Surgeon: Nicholes Stairs, MD;  Location: [place];  Service: Orthopedics;  Laterality: Right;   INTRAMEDULLARY (IM) NAIL INTERTROCHANTERIC Left 06/27/2020   Procedure: INTRAMEDULLARY (IM) NAIL INTERTROCHANTRIC;  Surgeon: Nicholes Stairs, MD;  Location: [place];  Service: Orthopedics;  Laterality: Left;   KENALOG INJECTION  07/03/2020   Procedure: KENALOG INJECTION;  Surgeon: Lavena Bullion, DO;  Location: [facility];  Service: Gastroenterology;;   STOMACH SURGERY     TOTAL HIP ARTHROPLASTY Bilateral    HPI:  Pt is a 66 y.o. female who who presented to the ED with due to fall and AMS. CT head: No acute intracranial abnormalities. CXR negative for acute chest process.  PMH: COPD, chronic pain, GERD, PUD and hiatal hernia.    Assessment / Plan / Recommendation  Clinical Impression  Pt was seen in the ED for bedside swallow evaluation. She was lethargic during the evaluation and the impact of this on her performance is considered. Pt did not follow the necessary commands for completion of a thorough oral mechanism exam. Pt was edentulous. She demonstrated oral holding, reduced bolus awareness, prolonged bolus manipulation, and absent mastication. With purees, at least three attempts at A-P transport were necessary prior to swallowing and pt spat out more advanced solid boluses. Anterior spillage was frequently noted with thin liquids. No s/sx of aspiration were noted with purees and no pharyngeal swallow was demonstrated with any other boluses. An NPO status is recommended at this time with allowance of ice chips and provision of critical meds crushed if pt is adequately alert. Results and recommendations were discussed with Larene Beach, RN and Dr. Karleen Hampshire who  are both in agreement. SLP will follow to assess improvement in swallow function. SLP Visit Diagnosis: Dysphagia, unspecified (R13.10)    Aspiration Risk  Moderate aspiration risk;Risk  for inadequate nutrition/hydration    Diet Recommendation NPO except meds (pt may have ice chips after oral care if adequately alert)   Medication Administration: Crushed with puree    Other  Recommendations Oral Care Recommendations: Oral care QID    Recommendations for follow up therapy are one component of a multi-disciplinary discharge planning process, led by the attending physician.  Recommendations may be updated based on patient status, additional functional criteria and insurance authorization.  Follow up Recommendations Other (comment) (TBD)      Assistance Recommended at Discharge Frequent or constant Supervision/Assistance  Functional Status Assessment Patient has had a recent decline in their functional status and demonstrates the ability to make significant improvements in function in a reasonable and predictable amount of time.  Frequency and Duration min 2x/week  2 weeks       Prognosis Prognosis for Safe Diet Advancement: Good Barriers to Reach Goals: Cognitive deficits      Swallow Study   General Date of Onset: 02/14/22 HPI: Pt is a 66 y.o. female who who presented to the ED with due to fall and AMS. CT head: No acute intracranial abnormalities. CXR negative for acute chest process.  PMH: COPD, chronic pain, GERD, PUD and hiatal hernia. Type of Study: Bedside Swallow Evaluation Previous Swallow Assessment: none Diet Prior to this Study: Regular;Thin liquids Temperature Spikes Noted: No Respiratory Status: Room air History of Recent Intubation: No Behavior/Cognition: Cooperative;Pleasant mood;Lethargic/Drowsy;Requires cueing;Doesn't follow directions Oral Cavity Assessment: Within Functional Limits Oral Care Completed by SLP: No Oral Cavity - Dentition: Edentulous Self-Feeding Abilities: Total assist Patient Positioning: Upright in bed;Postural control adequate for testing Baseline Vocal Quality: Low vocal intensity Volitional Cough: Cognitively unable to  elicit Volitional Swallow: Unable to elicit    Oral/Motor/Sensory Function Overall Oral Motor/Sensory Function: Within functional limits   Ice Chips Ice chips: Impaired Presentation: Spoon Oral Phase Impairments: Poor awareness of bolus   Thin Liquid Thin Liquid: Impaired Presentation: Cup;Straw Oral Phase Impairments: Reduced labial seal;Poor awareness of bolus Oral Phase Functional Implications: Oral holding;Left anterior spillage;Right anterior spillage    Nectar Thick Nectar Thick Liquid: Not tested   Honey Thick Honey Thick Liquid: Not tested   Puree Puree: Impaired Presentation: Spoon Oral Phase Impairments: Poor awareness of bolus;Reduced lingual movement/coordination Oral Phase Functional Implications: Oral holding;Prolonged oral transit   Solid     Solid: Impaired Presentation: Spoon Oral Phase Impairments: Impaired mastication;Poor awareness of bolus Oral Phase Functional Implications:  (pt pushed boluses out of mouth with lingual thrust)     Stryder Poitra I. Hardin Negus, [place], [place] Office number [phone] Pager [place] 02/14/2022,4:05 PM

## 2022-02-15 ENCOUNTER — Inpatient Hospital Stay (HOSPITAL_COMMUNITY): Payer: Medicare HMO

## 2022-02-15 DIAGNOSIS — R4182 Altered mental status, unspecified: Secondary | ICD-10-CM

## 2022-02-15 DIAGNOSIS — R55 Syncope and collapse: Secondary | ICD-10-CM

## 2022-02-15 DIAGNOSIS — Z7189 Other specified counseling: Secondary | ICD-10-CM

## 2022-02-15 LAB — ECHOCARDIOGRAM COMPLETE
AR max vel: 1.48 cm2
AV Area VTI: 1.72 cm2
AV Area mean vel: 1.47 cm2
AV Mean grad: 4 mmHg
AV Peak grad: 7.4 mmHg
Ao pk vel: 1.36 m/s
Area-P 1/2: 2.99 cm2
Calc EF: 69.5 %
MV M vel: 6.67 m/s
MV Peak grad: 178 mmHg
S' Lateral: 2.2 cm
Single Plane A2C EF: 63.4 %
Single Plane A4C EF: 78.4 %
Weight: 1199.3 oz

## 2022-02-15 LAB — BASIC METABOLIC PANEL
Anion gap: 10 (ref 5–15)
BUN: 8 mg/dL (ref 8–23)
CO2: 23 mmol/L (ref 22–32)
Calcium: 8.2 mg/dL — ABNORMAL LOW (ref 8.9–10.3)
Chloride: 109 mmol/L (ref 98–111)
Creatinine, Ser: 0.5 mg/dL (ref 0.44–1.00)
GFR, Estimated: >60 mL/min (ref >60)
Glucose, Bld: 69 mg/dL — ABNORMAL LOW (ref 70–99)
Potassium: 3.8 mmol/L (ref 3.5–5.1)
Sodium: 142 mmol/L (ref 135–145)

## 2022-02-15 LAB — CBC
HCT: 35.5 % — ABNORMAL LOW (ref 36.0–46.0)
Hemoglobin: 11 g/dL — ABNORMAL LOW (ref 12.0–15.0)
MCH: 28.8 pg (ref 26.0–34.0)
MCHC: 31 g/dL (ref 30.0–36.0)
MCV: 92.9 fL (ref 80.0–100.0)
Platelets: 170 10*3/uL (ref 150–400)
RBC: 3.82 MIL/uL — ABNORMAL LOW (ref 3.87–5.11)
RDW: 18.3 % — ABNORMAL HIGH (ref 11.5–15.5)
WBC: 4.6 10*3/uL (ref 4.0–10.5)
nRBC: 0 % (ref 0.0–0.2)

## 2022-02-15 LAB — MAGNESIUM: Magnesium: 1.7 mg/dL (ref 1.7–2.4)

## 2022-02-15 MED ORDER — ENSURE ENLIVE PO LIQD
237.0000 mL | Freq: Three times a day (TID) | ORAL | Status: DC
  Administered 2022-02-15 – 2022-02-16 (×3): 237 mL via ORAL

## 2022-02-15 MED ORDER — MUSCLE RUB 10-15 % EX CREA
TOPICAL_CREAM | CUTANEOUS | Status: DC | PRN
  Administered 2022-02-15: 1 via TOPICAL
  Filled 2022-02-15: qty 85

## 2022-02-15 MED ORDER — NICOTINE 14 MG/24HR TD PT24
14.0000 mg | MEDICATED_PATCH | Freq: Every day | TRANSDERMAL | Status: DC
  Administered 2022-02-15 – 2022-02-16 (×2): 14 mg via TRANSDERMAL
  Filled 2022-02-15 (×2): qty 1

## 2022-02-15 NOTE — Plan of Care (Signed)
  Problem: Clinical Measurements: Goal: Diagnostic test results will improve Outcome: Progressing   Problem: Nutrition: Goal: Adequate nutrition will be maintained Outcome: Progressing   

## 2022-02-15 NOTE — Progress Notes (Signed)
Physical Therapy Evaluation ?Patient Details ?Name: Kristine BrunsMarie P Kue ?MRN: [id number] ?DOB: 03-May-1956 ?Today's Date: 02/15/2022 ? ?History of Present Illness ? Pt adm with acute onset of fall and acute encephalopathy probably secondary to UTI.  So far work up for stroke has been negative .PMH - COPD, chronic pain, GERD, PUD and hiatal hernia  ?Clinical Impression ? Pt presents to PT with decreased mobility due to decreased strength, decreased balance, and decreased functional activity tolerance. Pt has support at home and recommend HHPT at DC.  ?   ?   ? ?Recommendations for follow up therapy are one component of a multi-disciplinary discharge planning process, led by the attending physician.  Recommendations may be updated based on patient status, additional functional criteria and insurance authorization. ? ?Follow Up Recommendations Home health PT ? ?  ?Assistance Recommended at Discharge Intermittent Supervision/Assistance  ?Patient can return home with the following ? Help with stairs or ramp for entrance;Assist for transportation;Direct supervision/assist for financial management;Direct supervision/assist for medications management ? ?  ?Equipment Recommendations None recommended by PT  ?Recommendations for Other Services ?    ?  ?Functional Status Assessment Patient has had a recent decline in their functional status and demonstrates the ability to make significant improvements in function in a reasonable and predictable amount of time.  ? ?  ?Precautions / Restrictions Precautions ?Precautions: Fall ?Restrictions ?Weight Bearing Restrictions: No  ? ?  ? ?Mobility ? Bed Mobility ?Overal bed mobility: Needs Assistance ?Bed Mobility: Supine to Sit, Sit to Supine ?  ?  ?Supine to sit: Supervision ?Sit to supine: Min assist ?  ?General bed mobility comments: Assist to get legs back up into the bed ?  ? ?Transfers ?Overall transfer level: Needs assistance ?Equipment used: Rolling walker (2 wheels) ?Transfers: Sit  to/from Stand ?Sit to Stand: Min guard ?  ?  ?  ?  ?  ?General transfer comment: Assist for safety ?  ? ?Ambulation/Gait ?Ambulation/Gait assistance: Min guard ?Gait Distance (Feet): 200 Feet ?Assistive device: Rolling walker (2 wheels) ?Gait Pattern/deviations: Step-through pattern, Decreased stride length ?Gait velocity: adequate ?Gait velocity interpretation: >2.62 ft/sec, indicative of community ambulatory ?  ?General Gait Details: Assist for safety ? ?Stairs ?  ?  ?  ?  ?  ? ?Wheelchair Mobility ?  ? ?Modified Rankin (Stroke Patients Only) ?  ? ?  ? ?Balance Overall balance assessment: Needs assistance ?Sitting-balance support: No upper extremity supported ?Sitting balance-Leahy Scale: Good ?  ?  ?Standing balance support: During functional activity, Bilateral upper extremity supported ?Standing balance-Leahy Scale: Poor ?Standing balance comment: walker and supervision for static standing ?  ?  ?  ?  ?  ?  ?  ?  ?  ?  ?  ?   ? ? ? ?Pertinent Vitals/Pain Pain Assessment ?Faces Pain Scale: No hurt  ? ? ?Home Living Family/patient expects to be discharged to:: Private residence ?Living Arrangements: Spouse/significant other (boyfriend) ?Available Help at Discharge: Family;Available 24 hours/day ?Type of Home: House ?Home Access: Stairs to enter ?Entrance Stairs-Rails: Right;Left;Can reach both ?Entrance Stairs-Number of Steps: 3 ?  ?Home Layout: One level ?Home Equipment: Agricultural consultantolling Walker (2 wheels);Cane - quad;BSC/3in1;Shower seat;Grab bars - tub/shower ?   ?  ?Prior Function Prior Level of Function : Independent/Modified Independent;History of Falls (last six months) ?  ?  ?  ?  ?  ?  ?Mobility Comments: hiostory of falls with previous bilateral hip fractures ?ADLs Comments: independent PTA ?  ? ? ?Hand Dominance  ? Dominant  Hand: Right ? ?  ?Extremity/Trunk Assessment  ? Upper Extremity Assessment ?Upper Extremity Assessment: Defer to OT evaluation ?  ? ?Lower Extremity Assessment ?Lower Extremity Assessment:  Generalized weakness ?  ? ?Cervical / Trunk Assessment ?Cervical / Trunk Assessment: Kyphotic  ?Communication  ? Communication: No difficulties  ?Cognition Arousal/Alertness: Awake/alert ?Behavior During Therapy: [facility] for tasks assessed/performed ?Overall Cognitive Status: Impaired/Different from baseline ?Area of Impairment: Memory, Safety/judgement, Awareness ?  ?  ?  ?  ?  ?  ?  ?  ?  ?  ?Memory: Decreased short-term memory ?  ?Safety/Judgement: Decreased awareness of safety ?Awareness: Emergent ?  ?  ?  ?  ? ?  ?General Comments   ? ?  ?Exercises    ? ?Assessment/Plan  ?  ?PT Assessment Patient needs continued PT services  ?PT Problem List Decreased strength;Decreased activity tolerance;Decreased balance;Decreased mobility;Decreased safety awareness ? ?   ?  ?PT Treatment Interventions Gait training;DME instruction;Functional mobility training;Therapeutic activities;Therapeutic exercise;Balance training;Patient/family education   ? ?PT Goals (Current goals can be found in the Care Plan section)  ?Acute Rehab PT Goals ?Patient Stated Goal: go home ?PT Goal Formulation: With patient ?Time For Goal Achievement: 03/01/22 ?Potential to Achieve Goals: Good ? ?  ?Frequency Min 3X/week ?  ? ? ?Co-evaluation   ?  ?  ?  ?  ? ? ?  ?AM-PAC PT "6 Clicks" Mobility  ?Outcome Measure Help needed turning from your back to your side while in a flat bed without using bedrails?: None ?Help needed moving from lying on your back to sitting on the side of a flat bed without using bedrails?: A Little ?Help needed moving to and from a bed to a chair (including a wheelchair)?: A Little ?Help needed standing up from a chair using your arms (e.g., wheelchair or bedside chair)?: A Little ?Help needed to walk in hospital room?: A Little ?Help needed climbing 3-5 steps with a railing? : A Little ?6 Click Score: 19 ? ?  ?End of Session   ?Activity Tolerance: Patient tolerated treatment well ?Patient left: in bed;with call bell/phone within  reach;with bed alarm set ?Nurse Communication: Mobility status ?PT Visit Diagnosis: Unsteadiness on feet (R26.81);Other abnormalities of gait and mobility (R26.89);Muscle weakness (generalized) (M62.81) ?  ? ?Time: 8416-6063 ?PT Time Calculation (min) (ACUTE ONLY): 13 min ? ? ?Charges:   PT Evaluation ?$PT Eval Moderate Complexity: 1 Mod ?  ?  ?   ? ? ?[facility] PT ?Acute Rehabilitation Services ?Pager [phone] ?Office [phone] ? ? ?Angelina Ok [facility] ?02/15/2022, 2:21 PM ?

## 2022-02-15 NOTE — Evaluation (Signed)
Occupational Therapy Evaluation Patient Details Name: [name] MRN: [id number] DOB: 1956-08-25 Today's Date: 02/15/2022   History of Present Illness Kristine Roberts is a 66 y.o. Caucasian female with medical history significant for COPD, chronic pain, GERD, PUD and hiatal hernia, who presented to the ER with acute onset of fall and acute encephalopathy probably secondary to UTI.  So far work up for stroke has been negative.   Clinical Impression   Pt currently min assist overall for completion of selfcare and toileting tasks this am.  She exhibits confusion with decreased orientation as well as decreased awareness and problem solving deficits.  Per Minerva Areola from nursing, who had a discussion on the phone with the pt's sister, pt was not taking her medications properly and still smoking at home.  Per pt she lives with her significant other Trey Paula and unsure if he can provide 24 hr supervision/assist at discharge.  Feel she can progress, but from a cognitive standpoint she will need this for medication management, meals, financial management, and overall balance and safety.  If significant other cannot safely provide this, recommend SNF for follow-up.  Currently recommend acute OT services to help progress ADL function and balance at this time until pt is ready for discharge.       Recommendations for follow up therapy are one component of a multi-disciplinary discharge planning process, led by the attending physician.  Recommendations may be updated based on patient status, additional functional criteria and insurance authorization.   Follow Up Recommendations  Home health OT    Assistance Recommended at Discharge Frequent or constant Supervision/Assistance  Patient can return home with the following A little help with walking and/or transfers;A little help with bathing/dressing/bathroom;Assistance with cooking/housework;Direct supervision/assist for medications management;Direct  supervision/assist for financial management    Functional Status Assessment  Patient has had a recent decline in their functional status and demonstrates the ability to make significant improvements in function in a reasonable and predictable amount of time.  Equipment Recommendations  None recommended by OT    Recommendations for Other Services       Precautions / Restrictions Precautions Precautions: Fall Restrictions Weight Bearing Restrictions: No      Mobility Bed Mobility Overal bed mobility: Needs Assistance Bed Mobility: Supine to Sit     Supine to sit: Supervision          Transfers Overall transfer level: Needs assistance Equipment used: Rolling walker (2 wheels) Transfers: Sit to/from Stand Sit to Stand: Min guard           General transfer comment: Cueing to reach back to the surface when sitting.      Balance Overall balance assessment: Needs assistance Sitting-balance support: No upper extremity supported Sitting balance-Leahy Scale: Good     Standing balance support: During functional activity, Bilateral upper extremity supported Standing balance-Leahy Scale: Poor Standing balance comment: Use of RW needed for balance                           ADL either performed or assessed with clinical judgement   ADL Overall ADL's : Needs assistance/impaired Eating/Feeding: Set up;Bed level   Grooming: Wash/dry hands;Min guard;Standing   Upper Body Bathing: Set up;Sitting   Lower Body Bathing: Sit to/from stand;Minimal assistance Lower Body Bathing Details (indicate cue type and reason): simulated Upper Body Dressing : Set up;Sitting Upper Body Dressing Details (indicate cue type and reason): simulated Lower Body Dressing: Minimal assistance;Sit to/from stand Lower  Body Dressing Details (indicate cue type and reason): gripper socks Toilet Transfer: Minimal assistance;Ambulation;BSC/3in1;Rolling walker (2 wheels)   Toileting-  Clothing Manipulation and Hygiene: Sit to/from stand;Minimal assistance       Functional mobility during ADLs: Minimal assistance;Rolling walker (2 wheels) General ADL Comments: Pt able to complete sit to stand from the EOB and 3:1 with min guard.  Mod instructional cueing for bringing the walker all the way back to the surface before needing to sit down.     Vision Baseline Vision/History: 1 Wears glasses (does not have them presently) Ability to See in Adequate Light: 1 Impaired Patient Visual Report: No change from baseline Vision Assessment?: No apparent visual deficits (via gross assesment did not note any but will continue to eval further in treatment.)     Perception Perception Perception: Within Functional Limits   Praxis Praxis Praxis: Intact    Pertinent Vitals/Pain Pain Assessment Pain Assessment: Faces Faces Pain Scale: No hurt     Hand Dominance Right   Extremity/Trunk Assessment Upper Extremity Assessment Upper Extremity Assessment: Generalized weakness (AROM WFLS for bilaterall with strength at 3+/5 throughout)   Lower Extremity Assessment Lower Extremity Assessment: Defer to PT evaluation   Cervical / Trunk Assessment Cervical / Trunk Assessment: Kyphotic   Communication Communication Communication: No difficulties   Cognition Arousal/Alertness: Awake/alert Behavior During Therapy: WFL for tasks assessed/performed Overall Cognitive Status: Impaired/Different from baseline Area of Impairment: Orientation, Memory, Safety/judgement, Awareness, Problem solving                 Orientation Level: Place, Time, Situation, Disoriented to   Memory: Decreased short-term memory   Safety/Judgement: Decreased awareness of safety Awareness: Emergent   General Comments: Pt not oriented to place or time stating "1922" and not being able to state the day of the week.  She was confused on the phone vs call button and was trying to use the phone to turn the TV  on and make adjustments.  She also was given ice with a spoon to eat as she is NPO except for the ice chips.  She put the spoon to her mouth like a straw.                Home Living Family/patient expects to be discharged to:: Private residence Living Arrangements: Spouse/significant other (boyfriend) Available Help at Discharge: Family;Available 24 hours/day Type of Home: House Home Access: Stairs to enter [employer] of Steps: 3 Entrance Stairs-Rails: Right;Left;Can reach both Home Layout: One level     Bathroom Shower/Tub: Producer, television/film/video: Standard Bathroom Accessibility: Yes   Home Equipment: Agricultural consultant (2 wheels);Cane - quad;BSC/3in1;Shower seat;Grab bars - tub/shower          Prior Functioning/Environment Prior Level of Function : Independent/Modified Independent;History of Falls (last six months)             Mobility Comments: hiostory of falls with previous bilateral hip fractures          OT Problem List: Decreased strength;Decreased knowledge of use of DME or AE;Decreased activity tolerance;Decreased cognition;Impaired balance (sitting and/or standing);Decreased safety awareness      OT Treatment/Interventions: Self-care/ADL training;Therapeutic exercise;Patient/family education;Balance training;Therapeutic activities;DME and/or AE instruction;Cognitive remediation/compensation    OT Goals(Current goals can be found in the care plan section) Acute Rehab OT Goals Patient Stated Goal: Pt did not specifically state, but requests something to eat. OT Goal Formulation: With patient Time For Goal Achievement: 03/01/22 Potential to Achieve Goals: Good  OT Frequency: Min  2X/week       AM-PAC OT "6 Clicks" Daily Activity     Outcome Measure Help from another person eating meals?: None Help from another person taking care of personal grooming?: A Little Help from another person toileting, which includes using toliet, bedpan,  or urinal?: A Little Help from another person bathing (including washing, rinsing, drying)?: A Little Help from another person to put on and taking off regular upper body clothing?: A Little Help from another person to put on and taking off regular lower body clothing?: A Little 6 Click Score: 19   End of Session Equipment Utilized During Treatment: Gait belt;Rolling walker (2 wheels) Nurse Communication: Mobility status  Activity Tolerance: Patient tolerated treatment well Patient left: in bed;with call bell/phone within reach  OT Visit Diagnosis: Unsteadiness on feet (R26.81);Repeated falls (R29.6);Muscle weakness (generalized) (M62.81);History of falling (Z91.81)                Time: 1610-9604 OT Time Calculation (min): 45 min Charges:  OT General Charges $OT Visit: 1 Visit OT Evaluation $OT Eval Moderate Complexity: 1 Mod OT Treatments $Self Care/Home Management : 23-37 mins   Shannen Flansburg OTR/L 02/15/2022, 9:52 AM

## 2022-02-15 NOTE — Progress Notes (Signed)
? ?  Echocardiogram ?2D Echocardiogram has been performed. ? ?Kristine Roberts ?02/15/2022, 1:59 PM ?

## 2022-02-15 NOTE — Progress Notes (Signed)
Initial Nutrition Assessment ? ?DOCUMENTATION CODES:  ?Severe malnutrition in context of chronic illness, Underweight ? ?INTERVENTION:  ?-Ensure Enlive po TID, each supplement provides 350 kcal and 20 grams of protein. ?-MVI with minerals daily ? ?NUTRITION DIAGNOSIS:  ?Severe Malnutrition related to chronic illness as evidenced by severe muscle depletion, severe fat depletion. ? ?GOAL:  ?Patient will meet greater than or equal to 90% of their needs ? ?MONITOR:  ?PO intake, Supplement acceptance, Labs, Weight trends ? ?REASON FOR ASSESSMENT:  ?Consult ?Assessment of nutrition requirement/status ? ?ASSESSMENT:  ?Pt with PMH significant for GERD w/ esophagitis, COPD, depression, and tobacoo/EtOH abuse admitted with AMS. UDS positive for THC. ? ?Pt's mentation has improved, reports appetite is fair and pt is agreeable to ONS.  ?Weight history reviewed. No significant weight changes noted. Pt is underweight.  ? ?PO Intake: 0% x 1 recorded meal   ? ?UOP: 400ml x24 hours ?I/O: +1042ml since admit ? ?15Medications: pepcid, mvi with minerals, IV abx ?Labs: ionized calcium 1.10-1.12 (L) ?  ?NUTRITION - FOCUSED PHYSICAL EXAM: ?Flowsheet Row Most Recent Value  ?Orbital Region Severe depletion  ?Upper Arm Region Severe depletion  ?Thoracic and Lumbar Region Severe depletion  ?Buccal Region Severe depletion  ?Temple Region Severe depletion  ?Clavicle Bone Region Severe depletion  ?Clavicle and Acromion Bone Region Severe depletion  ?Scapular Bone Region Severe depletion  ?Dorsal Hand Severe depletion  ?Patellar Region Severe depletion  ?Anterior Thigh Region Severe depletion  ?Posterior Calf Region Severe depletion  ?Edema (RD Assessment) None  ?Hair Reviewed  ?Eyes Reviewed  ?Mouth Reviewed  ?Skin Reviewed  ?Nails Reviewed  ? ?Diet Order:   ?Diet Order   ? ?       ?  DIET DYS 3 Room service appropriate? Yes; Fluid consistency: Thin  Diet effective now       ?  ? ?  ?  ? ?  ? ?EDUCATION NEEDS:  ?No education needs have been  identified at this time ? ?Skin:  Skin Assessment: Reviewed RN Assessment ? ?Last BM:  PTA ? ?Height:  ?Ht Readings from Last 1 Encounters:  ?12/17/20 5\' 2"  (1.575 m)  ? ?Weight:  ?Wt Readings from Last 1 Encounters:  ?02/14/22 34 kg  ? ?BMI:  Body mass index is 13.71 kg/m?. ? ?Estimated Nutritional Needs:  ?Kcal:  1200-1400 ?Protein:  60-70 grams ?Fluid:  >1.2L/d ? ? ?Rae LipsAmanda A., MS, RD, LDN (she/her/hers) ?RD pager number and weekend/on-call pager number located in Amion. ?

## 2022-02-15 NOTE — Consult Note (Cosign Needed)
Consultation Note Date: 02/15/2022   Patient Name: Kristine Roberts  DOB: 22-Jan-1956  MRN: [id number]  Age / Sex: 66 y.o., female  PCP: Monico Blitz, MD Referring Physician: Hosie Poisson, MD  Reason for Consultation: Establishing goals of care  HPI/Patient Profile: 66 y.o. female  with past medical history of COPD, chronic pain, GERD, PUD, hiatal hernia, admitted on 02/14/2022 with altered mental status, found unresponsive at home after a fall.   Patient was given Narcan by EMS, still somnolent.  Work-up has shown UTI, pending echo.  PMT has been consulted for goals of care conversation.  Clinical Assessment and Goals of Care:  I have reviewed medical records including EPIC notes, labs and imaging, received report from RN, assessed the patient and then met at the bedside along with her significant other Kristine Roberts to discuss diagnosis prognosis, GOC, EOL wishes, disposition and options.  I introduced Palliative Medicine as specialized medical care for people living with serious illness. It focuses on providing relief from the symptoms and stress of a serious illness. The goal is to improve quality of life for both the patient and the family.  We discussed a brief life review of the patient and then focused on their current illness. The natural disease trajectory and expectations at EOL were discussed.  I attempted to elicit values and goals of care important to the patient.    Medical History Review and Understanding: We reviewed Kristine Roberts's current illness including UTI, altered mental status, bradycardia, possible opioid overdose.  She understands that her behaviors including drinking and smoking also play a role in her overall health.  She reports years of chronic nausea and vomiting.  Social History: Kristine Roberts has been with her significant other for 20 years and they live together.  She has 2 sisters.  She has no  children.  She has a dog that brings her great joy.  Functional and Nutritional State: [name] tells me she is able to bathe herself, dress herself, and walk without assistive device.  She recently went to SNF for rehab and progressed well after needing a walker for less than a week.  She describes her nutrition is poor, telling me she sometimes overeats and often vomits her food.  She tells me she does not clean around the home as much as she would like due to fatigue.  Palliative Symptoms: Nausea, vomiting  Advance Directives: A detailed discussion regarding advanced directives was had.  Kristine Roberts wishes to defer HCPOA documentation at this time.  Code Status: Concepts specific to code status, artifical feeding and hydration, and rehospitalization were considered and discussed.   Discussion: [name]'s short-term goal is to return home without vomiting.  Her long-term goal is to be able to help with cleaning around the home.  She is very interested in rehab, willing to go to SNF again for strengthening and conditioning.  She is somewhat anxious, tearful when sharing with her boyfriend Kristine Roberts that she wants to do better with her contributions at home.  She also tells him she does not  want to be yelled at when she is unable to clean.  She would want Kristine Roberts to be her surrogate decision maker but does not want to proceed with HCPOA documentation at this time.  She is not ready for hospice philosophy.  She is interested in making changes to her lifestyle to prolong her life.    The difference between aggressive medical intervention and comfort care was considered in light of the patient's goals of care. Hospice and Palliative Care services outpatient were explained and offered.   Discussed the importance of continued conversation with family and the medical providers regarding overall plan of care and treatment options, ensuring decisions are within the context of the patients values and GOCs.   Questions  and concerns were addressed.  Hard Choices booklet left for review. The family was encouraged to call with questions or concerns.  PMT will continue to support holistically.       SUMMARY OF RECOMMENDATIONS   -Full code/full scope treatment -Patient is willing and interested for SNF, life-prolonging interventions -Psychosocial and emotional support provided -Ongoing conversations and support from PMT   Prognosis:  Unable to determine  Discharge Planning: [place] for rehab with Palliative care service follow-up      Primary Diagnoses: Present on Admission:  Altered mental status  Hypokalemia  COPD (chronic obstructive pulmonary disease) ([place])   I have reviewed the medical record, interviewed the patient and family, and examined the patient. The following aspects are pertinent.  Past Medical History:  Diagnosis Date   COPD (chronic obstructive pulmonary disease) ([facility])    Dysphagia    Dysrhythmia    Esophagitis    Gastric outlet obstruction    Gastric stenosis    GERD (gastroesophageal reflux disease)    Hiatal hernia    Osteoporosis    PUD (peptic ulcer disease)    Wounds, multiple    Social History   Socioeconomic History   Marital status: Widowed    Spouse name: Not on file   Number of children: Not on file   Years of education: Not on file   Highest education level: Not on file  Occupational History   Not on file  Tobacco Use   Smoking status: Every Day    Packs/day: 1.50    Years: 20.00    Pack years: 30.00    Types: Cigarettes   Smokeless tobacco: Never  Vaping Use   Vaping Use: Never used  Substance and Sexual Activity   Alcohol use: Not Currently    Alcohol/week: 12.0 standard drinks    Types: 12 Cans of beer per week    Comment: weekly   Drug use: No   Sexual activity: Yes    Birth control/protection: None  Other Topics Concern   Not on file  Social History Narrative   Not on file   Social Determinants of Health    Financial Resource Strain: Not on file  Food Insecurity: Not on file  Transportation Needs: Not on file  Physical Activity: Not on file  Stress: Not on file  Social Connections: Not on file   Family History  Problem Relation Age of Onset   Alzheimer's disease Mother    COPD Mother    Throat cancer Father    Breast cancer Sister    Scheduled Meds:  enoxaparin (LOVENOX) injection  20 mg Subcutaneous Q24H   famotidine  20 mg Oral Daily   feeding supplement  237 mL Oral TID BM   multivitamin with minerals  1  tablet Oral Daily   nicotine  14 mg Transdermal Daily   Continuous Infusions:  sodium chloride 100 mL/hr at 02/15/22 1558   cefTRIAXone (ROCEPHIN)  IV 200 mL/hr at 02/15/22 1100   PRN Meds:.acetaminophen **OR** acetaminophen, magnesium hydroxide, ondansetron **OR** ondansetron (ZOFRAN) IV, traZODone Medications Prior to Admission:  Prior to Admission medications   Medication Sig Start Date End Date Taking? Authorizing Provider  baclofen (LIORESAL) 10 MG tablet Take 10 mg by mouth at bedtime.   Yes [provider]  Cyanocobalamin 1000 MCG/ML KIT Inject 1,000 mcg into the skin every 30 (thirty) days.   Yes [provider]  PROVENTIL HFA 108 (90 Base) MCG/ACT inhaler Inhale 1 puff into the lungs every 6 (six) hours as needed for wheezing or shortness of breath.   Yes [provider]  traZODone (DESYREL) 100 MG tablet Take 100 mg by mouth at bedtime.   Yes [provider]  famotidine (PEPCID) 20 MG tablet Take 20 mg by mouth daily.    [provider]  furosemide (LASIX) 20 MG tablet Take 20 mg by mouth daily. 09/09/21   [provider]  Multiple Vitamin (MULTIVITAMIN WITH MINERALS) TABS tablet Take 1 tablet by mouth daily. Multivitamin for Women 50+    [provider]  oxyCODONE (OXY IR/ROXICODONE) 5 MG immediate release tablet Take 1 tablet (5 mg total) by mouth every 8 (eight) hours as needed for moderate pain. 12/14/21    Jennye Boroughs, MD  sertraline (ZOLOFT) 25 MG tablet Take 25 mg by mouth daily. 08/21/21   [provider]  sulfamethoxazole-trimethoprim (BACTRIM DS) 800-160 MG tablet Take 1 tablet by mouth 2 (two) times daily. Patient not taking: Reported on 02/14/2022 12/29/21   Franchot Gallo, MD  Tetrahydrozoline HCl (VISINE OP) Place 1 drop into both eyes every other day.     [provider]   Allergies  Allergen Reactions   Penicillins Itching, Swelling, Rash and Other (See Comments)    Tongue swells Has patient had a PCN reaction causing immediate rash, facial/tongue/throat swelling, SOB or lightheadedness with hypotension: Yes, had a rash and tongue swelled up. No SOB and lightheadedness. Has patient had a PCN reaction causing severe rash involving mucus membranes or skin necrosis: No Has patient had a PCN reaction that required hospitalization: No Has patient had a PCN reaction occurring within the last 10 years: No If all of the above answers are "NO", then may procee   Review of Systems  Gastrointestinal:  Positive for nausea and vomiting.  All other systems reviewed and are negative.  Physical Exam Vitals and nursing note reviewed.  Constitutional:      General: She is not in acute distress.    Appearance: She is ill-appearing.  Cardiovascular:     Rate and Rhythm: Bradycardia present.  Pulmonary:     Effort: Pulmonary effort is normal. No respiratory distress.  Skin:    General: Skin is warm and dry.  Neurological:     Mental Status: She is alert.    Vital Signs: BP 122/86    Pulse (!) 54    Temp 98.7 F (37.1 C) (Oral)    Resp 18    Wt 34 kg    SpO2 97%    BMI 13.71 kg/m  Pain Scale: 0-10   Pain Score: 0-No pain   SpO2: SpO2: 97 % O2 Device:SpO2: 97 % O2 Flow Rate: .   IO: Intake/output summary:  Intake/Output Summary (Last 24 hours) at 02/15/2022 1939 Last data filed  at 02/15/2022 1900 Gross per 24 hour  Intake 956.39 ml  Output 0 ml  Net 956.39  ml    LBM:   Baseline Weight: Weight: 31.2 kg Most recent weight: Weight: 34 kg     Palliative Assessment/Data: 70%    Total time: I spent 79 minutes in the care of the patient today in the above activities and documenting the encounter.   Dorthy Cooler, PA-C Palliative Medicine Team Team phone # [phone]  Thank you for allowing the Palliative Medicine Team to assist in the care of this patient. Please utilize secure chat with additional questions, if there is no response within 30 minutes please call the above phone number.  Palliative Medicine Team providers are available by phone from 7am to 7pm daily and can be reached through the team cell phone.  Should this patient require assistance outside of these hours, please call the patient's attending physician.

## 2022-02-15 NOTE — Progress Notes (Signed)
Speech Language Pathology Treatment: Dysphagia  ?Patient Details ?Name: Kristine Roberts ?MRN: [id number] ?DOB: 12/21/1955 ?Today's Date: 02/15/2022 ?Time: 9390-3009 ?SLP Time Calculation (min) (ACUTE ONLY): 13 min ? ?Assessment / Plan / Recommendation ?Clinical Impression ? F/u after yesterday's assessment. Pt much more alert and interactive.  A dysphagia 3 diet was written for her. Per observation today, despite absence of her dentures, there are no oropharyngeal deficits that would prohibit her from eating regular solids. She demonstrated adequate mastication, the appearance of a brisk swallow, and no s/s of aspiration. We discussed her chronic issues with strictures s/p her partial gastrectomy; recurrent regurgitation and weight loss. Advanced her diet to regular solids so that she may select foods that are best suited to meet her needs. No further SLP f/u for swallowing is needed. Our service will sign off. ?  ?HPI HPI: Pt is a 66 y.o. female who who presented to the ED with due to fall and AMS. CT head: No acute intracranial abnormalities. CXR negative for acute chest process.  Dx UTI, dehydration. PMH: COPD, chronic pain, GERD, PUD and hiatal hernia. 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 ?  ?   ?SLP Plan ? All goals met ? ?  ?  ?Recommendations for follow up therapy are one component of a multi-disciplinary discharge planning process, led by the attending physician.  Recommendations may be updated based on patient status, additional functional criteria and insurance authorization. ?  ? ?Recommendations  ?Diet recommendations: Regular;Thin liquid ?Liquids provided via: Cup;Straw ?Medication Administration: Crushed with puree ?Supervision: Patient able to self feed ?Compensations: Follow solids with liquid  ?   ?    ?   ? ? ? ? Oral Care Recommendations: Oral care BID ?Follow  Up Recommendations: No SLP follow up ?SLP Visit Diagnosis: Dysphagia, unspecified (R13.10) ?Plan: All goals met ? ? ? ? ?  ? Everton Bertha L. Whitney Hillegass, MA CCC/SLP ?Acute Rehabilitation Services ?Office number [phone] ?Pager [phone] ? ? ? ?Juan Quam Laurice ? ?02/15/2022, 5:10 PM ?

## 2022-02-15 NOTE — Progress Notes (Signed)
[facility]                                                                               Kristine Roberts, is a 66 y.o. female, DOB - [birth year], ZOX:[id number] Admit date - 02/14/2022    Outpatient Primary MD for the patient is Kirstie PeriShah, Ashish, MD  LOS - 1  days    Brief summary   No notes on file   Assessment & Plan    Assessment and Plan: * Altered mental status acute metabolic encephalopathy from dehydration and UTI Resolved.  UDS is positive for tetrahydrocannabinol.  - CT and MRI negative for acute stroke.  Pt is alert and conversing.   Acute lower UTI On rocephin , urine cultures growing gram negative rods.   Sinus bradycardia - This could be contributing to her altered mental status and syncope. - appears to have improved. She is currently asymptomatic.  Echocardiogram ordered and pending.    GERD without esophagitis Stable.   Hypokalemia Replaced.    Depression - We will briefly hold off Zoloft and avoid sedatives.  COPD (chronic obstructive pulmonary disease) (HCC) No wheezing heard on exam.     Tobacco abuse:  On nicotine patch.  Cessation counseling given.     Alcohol abuse:  - no signs of withdrawal.    RN Pressure Injury Documentation: Pressure Injury 12/26/17 Stage I -  Intact skin with non-blanchable redness of a localized area usually over a bony prominence. (Active)  12/26/17 1506  Location: Sacrum  Location Orientation: Right  Staging: Stage I -  Intact skin with non-blanchable redness of a localized area usually over a bony prominence.  Wound Description (Comments):   Present on Admission: Yes     Pressure Injury 12/26/17 Stage II -  Partial thickness loss of dermis presenting as a shallow open ulcer with a red, pink wound bed without slough. (Active)  12/26/17 1507  Location: Anus  Location Orientation: Right  Staging: Stage II -  Partial thickness loss of dermis presenting as a shallow open ulcer with a red,  pink wound bed without slough.  Wound Description (Comments):   Present on Admission: Yes        Estimated body mass index is 13.71 kg/m as calculated from the following:   Height as of 12/17/20: 5\' 2"  (1.575 m).   Weight as of this encounter: 34 kg.  Code Status: full code.  DVT Prophylaxis:     Level of Care: Level of care: Telemetry Medical Family Communication: none at bedside.   Disposition Plan:     Remains inpatient appropriate:  work up for bradycardia and UTI.   Procedures:  ECHO.   Consultants:   Palliative care.   Antimicrobials:   Anti-infectives (From admission, onward)    Start     Dose/Rate Route Frequency Ordered Stop   02/15/22 1000  cefTRIAXone (ROCEPHIN) 1 g in sodium chloride 0.9 % 100 mL IVPB        1 g 200 mL/hr over 30 Minutes Intravenous Every 24 hours 02/14/22 0531     02/14/22 0515  cefTRIAXone (ROCEPHIN) 1 g in sodium chloride 0.9 % 100 mL IVPB  1 g 200 mL/hr over 30 Minutes Intravenous  Once 02/14/22 1610 02/14/22 0725        Medications  Scheduled Meds:  enoxaparin (LOVENOX) injection  20 mg Subcutaneous Q24H   famotidine  20 mg Oral Daily   multivitamin with minerals  1 tablet Oral Daily   nicotine  14 mg Transdermal Daily   Continuous Infusions:  sodium chloride Stopped (02/15/22 1045)   cefTRIAXone (ROCEPHIN)  IV 200 mL/hr at 02/15/22 1100   PRN Meds:.acetaminophen **OR** acetaminophen, magnesium hydroxide, ondansetron **OR** ondansetron (ZOFRAN) IV, traZODone    Subjective:   Kristine Roberts was seen and examined today.  Pt alert and answering all questions appropriately.   Objective:   Vitals:   02/14/22 1817 02/14/22 2032 02/15/22 0421 02/15/22 0503  BP:  123/76  125/90  Pulse:  (!) 55 69 71  Resp:  10  18  Temp:  98.1 F (36.7 C)  98.2 F (36.8 C)  TempSrc:  Axillary  Oral  SpO2:  100% 100% 97%  Weight: 34 kg 34 kg      Intake/Output Summary (Last 24 hours) at 02/15/2022 1218 Last data filed at  02/15/2022 1100 Gross per 24 hour  Intake 642.39 ml  Output 400 ml  Net 242.39 ml   Filed Weights   02/14/22 1749 02/14/22 1817 02/14/22 2032  Weight: 31.2 kg 34 kg 34 kg     Exam General exam: cachetic looking lady not in distress.  Respiratory system: Clear to auscultation. Respiratory effort normal. Cardiovascular system: S1 & S2 heard, RRR. No JVD, No pedal edema. Gastrointestinal system: Abdomen is nondistended, soft and nontender. Normal bowel sounds heard. Central nervous system: Alert and oriented to place and person, grossly non focal.  Extremities: Symmetric 5 x 5 power. Skin: No rashes, lesions or ulcers Psychiatry: mood is appropriate.   Data Reviewed:  I have personally reviewed following labs and imaging studies   CBC Lab Results  Component Value Date   WBC 4.6 02/15/2022   RBC 3.82 (L) 02/15/2022   HGB 11.0 (L) 02/15/2022   HCT 35.5 (L) 02/15/2022   MCV 92.9 02/15/2022   MCH 28.8 02/15/2022   PLT 170 02/15/2022   MCHC 31.0 02/15/2022   RDW 18.3 (H) 02/15/2022   LYMPHSABS 1.0 02/14/2022   MONOABS 0.7 02/14/2022   EOSABS 0.0 02/14/2022   BASOSABS 0.0 02/14/2022     Last metabolic panel Lab Results  Component Value Date   NA 142 02/15/2022   K 3.8 02/15/2022   CL 109 02/15/2022   CO2 23 02/15/2022   BUN 8 02/15/2022   CREATININE 0.50 02/15/2022   GLUCOSE 69 (L) 02/15/2022   GFRNONAA >60 02/15/2022   GFRAA >60 07/30/2020   CALCIUM 8.2 (L) 02/15/2022   PHOS 2.7 02/14/2022   PROT 5.8 (L) 02/14/2022   ALBUMIN 2.7 (L) 02/14/2022   BILITOT 0.1 (L) 02/14/2022   ALKPHOS 89 02/14/2022   AST 18 02/14/2022   ALT 8 02/14/2022   ANIONGAP 10 02/15/2022    CBG (last 3)  Recent Labs    02/14/22 0130  GLUCAP 85      Coagulation Profile: No results for input(s): INR, PROTIME in the last 168 hours.   Radiology Studies: CT Head Wo Contrast  Result Date: 02/14/2022 CLINICAL DATA:  Minor head trauma.  Patient was found down. EXAM: CT HEAD WITHOUT  CONTRAST TECHNIQUE: Contiguous axial images were obtained from the base of the skull through the vertex without intravenous contrast. RADIATION DOSE REDUCTION: This exam  was performed according to the departmental dose-optimization program which includes automated exposure control, adjustment of the mA and/or kV according to patient size and/or use of iterative reconstruction technique. COMPARISON:  08/31/2021 FINDINGS: Brain: No evidence of acute infarction, hemorrhage, hydrocephalus, extra-axial collection or mass lesion/mass effect. Mild cerebral atrophy. Vascular: Moderate intracranial arterial vascular calcification. Skull: Calvarium appears intact. Sinuses/Orbits: Paranasal sinuses and mastoid air cells are clear. Other: None. IMPRESSION: No acute intracranial abnormalities.  Mild chronic cerebral atrophy. Electronically Signed   By: Burman Nieves M.D.   On: 02/14/2022 03:10   CT Cervical Spine Wo Contrast  Result Date: 02/14/2022 CLINICAL DATA:  Neck trauma.  Patient found down. EXAM: CT CERVICAL SPINE WITHOUT CONTRAST TECHNIQUE: Multidetector CT imaging of the cervical spine was performed without intravenous contrast. Multiplanar CT image reconstructions were also generated. RADIATION DOSE REDUCTION: This exam was performed according to the departmental dose-optimization program which includes automated exposure control, adjustment of the mA and/or kV according to patient size and/or use of iterative reconstruction technique. COMPARISON:  None. FINDINGS: Alignment: Normal alignment. Skull base and vertebrae: Skull base appears intact. No vertebral compression deformities. No focal bone lesion or bone destruction. Soft tissues and spinal canal: No prevertebral soft tissue swelling. No abnormal paraspinal soft tissue mass or infiltration. Vascular calcifications. Disc levels: Degenerative changes with disc space narrowing and endplate osteophyte formation most prominent at C5-6 level. Upper chest:  Emphysematous changes in the lung apices. Visualized esophagus is dilated and gas filled, possibly dysmotility or distal stricture. Other: None. IMPRESSION: Normal alignment. Degenerative changes. No acute displaced fractures identified. Incidental note of a dilated esophagus. Indeterminate etiology. Electronically Signed   By: Burman Nieves M.D.   On: 02/14/2022 03:13   MR BRAIN WO CONTRAST  Result Date: 02/14/2022 CLINICAL DATA:  Mental status change, unknown cause.  Fall. EXAM: MRI HEAD WITHOUT CONTRAST TECHNIQUE: Multiplanar, multiecho pulse sequences of the brain and surrounding structures were obtained without intravenous contrast. COMPARISON:  Head CT 02/14/2022 FINDINGS: The study is mildly motion degraded. Brain: There is no evidence of an acute infarct, intracranial hemorrhage, mass, midline shift, or extra-axial fluid collection. Mild cerebral atrophy is within normal limits for age. No significant white matter disease is seen for age. Vascular: Major intracranial vascular flow voids are preserved. Skull and upper cervical spine: Unremarkable bone marrow signal para Sinuses/Orbits: Unremarkable orbits. Minimal mucosal thickening in the left maxillary sinus which is small. Clear mastoid air cells. Other: None. IMPRESSION: Unremarkable appearance of the brain for age. Electronically Signed   By: Sebastian Ache M.D.   On: 02/14/2022 17:20   DG Pelvis Portable  Result Date: 02/14/2022 CLINICAL DATA:  Altered mental status and fall. EXAM: PORTABLE PELVIS 1-2 VIEWS COMPARISON:  CT 01/01/2022 FINDINGS: Comminuted fractures of the right superior and inferior pubic rami, unchanged since prior CT. Old fracture deformity of the inter trochanteric right hip post internal fixation, also unchanged. Internal fixation also of the left hip. No acute displaced fractures are identified. SI joints and symphysis pubis appear intact. IMPRESSION: No acute bony abnormalities. Old fracture deformities of the right pubic  rami and bilateral hips. Electronically Signed   By: Burman Nieves M.D.   On: 02/14/2022 01:53   DG Chest Port 1 View  Result Date: 02/14/2022 CLINICAL DATA:  Altered mentation, fall injury. EXAM: PORTABLE CHEST 1 VIEW COMPARISON:  CT 08/31/2021 FINDINGS: The cardiac size is normal. There is aortic atherosclerosis and tortuosity with stable mediastinum. The lungs are clear of infiltrates. There are calcified granulomas  in the left lower lobe. No pleural effusion is seen. Mild thoracic dextroscoliosis and osteopenia. Multiple chronic fractures of the posterior ribcage are again shown. Multiple overlying monitor wires. IMPRESSION: No evidence of acute chest process or interval changes. Aortic atherosclerosis. Electronically Signed   By: Almira Bar M.D.   On: 02/14/2022 01:58       Kathlen Mody M.D. [facility] 02/15/2022, 12:18 PM  Available via Epic secure chat 7am-7pm After 7 pm, please refer to night coverage provider listed on amion.

## 2022-02-16 ENCOUNTER — Other Ambulatory Visit: Payer: Self-pay | Admitting: Cardiology

## 2022-02-16 DIAGNOSIS — I4510 Unspecified right bundle-branch block: Secondary | ICD-10-CM

## 2022-02-16 DIAGNOSIS — R001 Bradycardia, unspecified: Secondary | ICD-10-CM

## 2022-02-16 DIAGNOSIS — R4182 Altered mental status, unspecified: Secondary | ICD-10-CM | POA: Diagnosis not present

## 2022-02-16 DIAGNOSIS — K219 Gastro-esophageal reflux disease without esophagitis: Secondary | ICD-10-CM

## 2022-02-16 DIAGNOSIS — N390 Urinary tract infection, site not specified: Secondary | ICD-10-CM | POA: Diagnosis not present

## 2022-02-16 DIAGNOSIS — Z7189 Other specified counseling: Secondary | ICD-10-CM | POA: Diagnosis not present

## 2022-02-16 LAB — URINE CULTURE: Culture: 50000 — AB

## 2022-02-16 MED ORDER — CEPHALEXIN 500 MG PO CAPS
500.0000 mg | ORAL_CAPSULE | Freq: Two times a day (BID) | ORAL | Status: DC
  Administered 2022-02-16: 500 mg via ORAL
  Filled 2022-02-16: qty 1

## 2022-02-16 MED ORDER — ENSURE ENLIVE PO LIQD: 237.0000 mL | Freq: Three times a day (TID) | ORAL | 0 refills | Status: AC

## 2022-02-16 MED ORDER — HYDROCODONE-ACETAMINOPHEN 5-325 MG PO TABS
1.0000 | ORAL_TABLET | Freq: Once | ORAL | Status: AC
  Administered 2022-02-16: 1 via ORAL
  Filled 2022-02-16: qty 1

## 2022-02-16 MED ORDER — CEPHALEXIN 500 MG PO CAPS: 500.0000 mg | ORAL_CAPSULE | Freq: Two times a day (BID) | ORAL | 0 refills | Status: AC

## 2022-02-16 MED ORDER — NICOTINE 14 MG/24HR TD PT24: 14.0000 mg | MEDICATED_PATCH | Freq: Every day | TRANSDERMAL | 0 refills | Status: AC

## 2022-02-16 NOTE — Progress Notes (Signed)
Palliative Medicine Progress Note ? ? ?HPI/Patient Profile: 66 y.o. female with past medical history of COPD, chronic pain, GERD, PUD, hiatal hernia, admitted on 02/14/2022 with altered mental status, found unresponsive at home after a fall.  ?  ?Patient was given Narcan by EMS, still somnolent.  Work-up has shown UTI, pending echo.  PMT has been consulted for goals of care conversation. ? ?Subjective: ?Patient assessed at the bedside. She is dressed in her own clothing, reports that she is going home today. Her boyfriend Trey Paula is present at the bedside. ? ?Created space and opportunity for patient's thoughts and feelings on her current illness, following up on yesterday's goals of care conversation. She remains focused on continuing efforts to improve her health and prolong her life. She has no additional questions or concerns at this time.  ? ?Palliative care outpatient services were again explained and offered. Felicity tells me she will have home health support and it would be too overwhelming for her to have additional home visits at this time. Encouraged continued conversation with family and the medical providers regarding overall plan of care and treatment options, ensuring decisions are within the context of the patient?s values and GOCs. Encouraged patient to request palliative services from PCP if interested in the future. ? ?Physical Exam: ?General: elderly caucasian female in no acute distress ?Skin: warm and dry ?Respiratory: normal respiratory effort on room air ?Psych: normal mood and behavior ?Neuro: alert and oriented x3 ? ? ? ?Assessment: ?Altered mental status, resolved ?UTI ?COPD ?Goals of care conversation ? ?Recommendations/Plan: ?Full code/full scope treatment ?Patient declines outpatient palliative care referral ?PMT is available for acute needs ? ? ?Total time: ?I spent 40 minutes in the care of the patient today in the above activities and documenting the encounter. ? ? ? ?Richardson Dopp,  PA-C ?Palliative Medicine Team ?Team phone # [phone] ? ?Thank you for allowing the Palliative Medicine Team to assist in the care of this patient. Please utilize secure chat with additional questions, if there is no response within 30 minutes please call the above phone number. ? ?Palliative Medicine Team providers are available by phone from 7am to 7pm daily and can be reached through the team cell phone.  ?Should this patient require assistance outside of these hours, please call the patient's attending physician.  ? ? ?

## 2022-02-16 NOTE — Progress Notes (Signed)
Ordered 30 day event monitor for sinus bradycardia ?

## 2022-02-16 NOTE — TOC Transition Note (Signed)
Transition of Care (TOC) - CM/SW Discharge Note ? ? ?Patient Details  ?Name: [name] ?MRN: [id number] ?Date of Birth: January 09, 1956 ? ?Transition of Care (TOC) CM/SW Contact:  ?Tom-Johnson, Hershal Coria, RN ?Phone Number: ?02/16/2022, 12:12 PM ? ? ?Clinical Narrative:    ? ?Patient is scheduled for discharge today. Home health referral with [facility] per patient's request. CM spoke with Misty Stanley and acceptance voiced. Information on AVS. Sister to transport at discharge. No further TOC needs noted. ? ?Final next level of care: Home w Home Health Services ?Barriers to Discharge: Barriers Resolved ? ? ?Patient Goals and CMS Choice ?Patient states their goals for this hospitalization and ongoing recovery are:: To return home ?CMS Medicare.gov Compare Post Acute Care list provided to:: Patient ?Choice offered to / list presented to : Parent ? ?Discharge Placement ?  ?           ?  ?Patient to be transferred to facility by: Sister ?  ?  ? ?Discharge Plan and Services ?  ?  ?           ?DME Arranged: N/A ?DME Agency: NA ?  ?  ?  ?HH Arranged: PT, OT ?HH Agency: [facility] ?Date HH Agency Contacted: 02/16/22 ?Time HH Agency Contacted: 1205 ?Representative spoke with at [facility] Agency: Barbara Cower ? ?Social Determinants of Health (SDOH) Interventions ?  ? ? ?Readmission Risk Interventions ?Readmission Risk Prevention Plan 07/01/2020 08/31/2019  ?Transportation Screening Complete Complete  ?PCP or Specialist Appt within 5-7 Days - Not Complete  ?Not Complete comments - SNF MD will follow up  ?PCP or Specialist Appt within 3-5 Days Complete -  ?Home Care Screening - Not Complete  ?Home Care Screening Not Completed Comments - Going to SNF  ?Medication Review (RN CM) - Complete  ?HRI or Home Care Consult Complete -  ?Social Work Consult for Recovery Care Planning/Counseling Complete -  ?Palliative Care Screening Complete -  ?Medication Review Oceanographer) Complete -  ?Some recent data might be hidden   ? ? ? ? ? ?

## 2022-02-16 NOTE — Plan of Care (Signed)
  Problem: Education: Goal: Knowledge of General Education information will improve Description: Including pain rating scale, medication(s)/side effects and non-pharmacologic comfort measures Outcome: Adequate for Discharge   Problem: Health Behavior/Discharge Planning: Goal: Ability to manage health-related needs will improve Outcome: Adequate for Discharge   Problem: Clinical Measurements: Goal: Ability to maintain clinical measurements within normal limits will improve Outcome: Adequate for Discharge Goal: Will remain free from infection Outcome: Adequate for Discharge Goal: Diagnostic test results will improve Outcome: Adequate for Discharge Goal: Respiratory complications will improve Outcome: Adequate for Discharge Goal: Cardiovascular complication will be avoided Outcome: Adequate for Discharge   Problem: Activity: Goal: Risk for activity intolerance will decrease Outcome: Adequate for Discharge   Problem: Nutrition: Goal: Adequate nutrition will be maintained Outcome: Adequate for Discharge   Problem: Coping: Goal: Level of anxiety will decrease Outcome: Adequate for Discharge   Problem: Elimination: Goal: Will not experience complications related to bowel motility Outcome: Adequate for Discharge Goal: Will not experience complications related to urinary retention Outcome: Adequate for Discharge   Problem: Pain Managment: Goal: General experience of comfort will improve Outcome: Adequate for Discharge   Problem: Safety: Goal: Ability to remain free from injury will improve Outcome: Adequate for Discharge   Problem: Skin Integrity: Goal: Risk for impaired skin integrity will decrease Outcome: Adequate for Discharge   Problem: Urinary Elimination: Goal: Signs and symptoms of infection will decrease Outcome: Adequate for Discharge   

## 2022-02-16 NOTE — Plan of Care (Signed)
?  Problem: Health Behavior/Discharge Planning: ?Goal: Ability to manage health-related needs will improve ?Outcome: Progressing ?  ?Problem: Clinical Measurements: ?Goal: Ability to maintain clinical measurements within normal limits will improve ?Outcome: Progressing ?Goal: Will remain free from infection ?Outcome: Progressing ?Goal: Diagnostic test results will improve ?Outcome: Progressing ?Goal: Respiratory complications will improve ?Outcome: Progressing ?  ?Problem: Nutrition: ?Goal: Adequate nutrition will be maintained ?Outcome: Progressing ?  ?Problem: Coping: ?Goal: Level of anxiety will decrease ?Outcome: Progressing ?  ?Problem: Elimination: ?Goal: Will not experience complications related to bowel motility ?Outcome: Progressing ?Goal: Will not experience complications related to urinary retention ?Outcome: Progressing ?  ?Problem: Pain Managment: ?Goal: General experience of comfort will improve ?Outcome: Progressing ?  ?Problem: Safety: ?Goal: Ability to remain free from injury will improve ?Outcome: Progressing ?  ?Problem: Skin Integrity: ?Goal: Risk for impaired skin integrity will decrease ?Outcome: Progressing ?  ?Problem: Urinary Elimination: ?Goal: Signs and symptoms of infection will decrease ?Outcome: Progressing ?  ?

## 2022-02-16 NOTE — Discharge Summary (Signed)
?Physician Discharge Summary ?  ?Patient: Kristine Roberts MRN: [id number] DOB: [birth year]  ?Admit date:     02/14/2022  ?Discharge date: 02/16/22  ?Discharge Physician: Hosie Poisson  ? ?PCP: Monico Blitz, MD  ? ?Recommendations at discharge:  ?Please follow up with PCP in one week.  ?Please follow up with cardiology for Holter Monitor evaluation.  ?Please check cbc and BMP in one week.  ? ?Discharge Diagnoses: ?Principal Problem: ?  Altered mental status ?Active Problems: ?  Acute lower UTI ?  Sinus bradycardia ?  GERD without esophagitis ?  Hypokalemia ?  Depression ?  COPD (chronic obstructive pulmonary disease) ([place]) ?  Goals of care, counseling/discussion ? ? ? ?Hospital Course: ? ?Kristine Roberts is a 66 y.o. Caucasian female with medical history significant for COPD, chronic pain, GERD, PUD and hiatal hernia, who presented to the ER with acute onset of fall and acute encephalopathy probably secondary to UTI. ?Assessment and Plan: ? ?Altered mental status ?acute metabolic encephalopathy from dehydration and UTI ?Resolved.  ?UDS is positive for tetrahydrocannabinol.  ?- CT and MRI negative for acute stroke.  ?She is alert and oriented,  ?  ?Acute lower UTI ?On rocephin , urine cultures growing  e coli.  ?She was started on IV rocephin and transitioned to keflex to complete the course.  ?  ?Sinus bradycardia ?- This could be contributing to her altered mental status and syncope. ?- appears to have improved. ?She is currently asymptomatic.  ?Echocardiogram ordered and discussed the results with the patient.  ?It shows Left ventricular ejection fraction, by estimation, is 60 to 65%. The  ?left ventricle has normal function. The left ventricle has no regional  ?wall motion abnormalities. There is moderate left ventricular hypertrophy  ?of the basal-septal segment. Left  ?ventricular diastolic parameters are consistent with Grade II diastolic  ?dysfunction (pseudonormalization). ?  ?  ?GERD without  esophagitis ?Stable.  ?  ?Hypokalemia ?Replaced.  ?  ?  ?Depression ?- as per the patient and her sister, she is not on zoloft or baclofen.  ?  ?COPD (chronic obstructive pulmonary disease) ([place]) ?No wheezing heard on exam.  ?Resume bronchodilators as needed.  ?  ?  ?  ?Tobacco abuse:  ?On nicotine patch.  ?Cessation counseling given.  ?  ?  ?  ?Alcohol abuse:  ?- no signs of withdrawal.  ?  ?  ?  ?  ?Estimated body mass index is 13.71 kg/m? as calculated from the following: ?  Height as of 12/17/20: 5' 2"  (1.575 m). ?  Weight as of this encounter: 34 kg. ? ?  ? ? ?Consultants: palliative care ?Procedures performed: echo  ?Disposition: Home ?Diet recommendation:  ?Discharge Diet Orders (From admission, onward)  ? ?  Start     Ordered  ? 02/16/22 0000  Diet - low sodium heart healthy       ? 02/16/22 1132  ? ?  ?  ? ?  ? ?Regular diet ?DISCHARGE MEDICATION: ?Allergies as of 02/16/2022   ? ?   Reactions  ? Penicillins Itching, Swelling, Rash, Other (See Comments)  ? Tongue swells ?Has patient had a PCN reaction causing immediate rash, facial/tongue/throat swelling, SOB or lightheadedness with hypotension: Yes, had a rash and tongue swelled up. No SOB and lightheadedness. ?Has patient had a PCN reaction causing severe rash involving mucus membranes or skin necrosis: No ?Has patient had a PCN reaction that required hospitalization: No ?Has patient had a PCN reaction occurring within the last 10  years: No ?If all of the above answers are "NO", then may procee  ? ?  ? ?  ?Medication List  ?  ? ?STOP taking these medications   ? ?baclofen 10 MG tablet ?Commonly known as: LIORESAL ?  ?oxyCODONE 5 MG immediate release tablet ?Commonly known as: Oxy IR/ROXICODONE ?  ?sertraline 25 MG tablet ?Commonly known as: ZOLOFT ?  ?sulfamethoxazole-trimethoprim 800-160 MG tablet ?Commonly known as: BACTRIM DS ?  ?[place] OP ?  ? ?  ? ?TAKE these medications   ? ?cephALEXin 500 MG capsule ?Commonly known as: KEFLEX ?Take 1 capsule (500 mg  total) by mouth every 12 (twelve) hours for 5 days. ?  ?Cyanocobalamin 1000 MCG/ML Kit ?Inject 1,000 mcg into the skin every 30 (thirty) days. ?  ?famotidine 20 MG tablet ?Commonly known as: PEPCID ?Take 20 mg by mouth daily. ?  ?feeding supplement Liqd ?Take 237 mLs by mouth 3 (three) times daily between meals. ?  ?furosemide 20 MG tablet ?Commonly known as: LASIX ?Take 20 mg by mouth daily. ?  ?multivitamin with minerals Tabs tablet ?Take 1 tablet by mouth daily. Multivitamin for Women 50+ ?  ?nicotine 14 mg/24hr patch ?Commonly known as: NICODERM CQ - dosed in mg/24 hours ?Place 1 patch (14 mg total) onto the skin daily. ?Start taking on: February 17, 2022 ?  ?Proventil HFA 108 (90 Base) MCG/ACT inhaler ?Generic drug: albuterol ?Inhale 1 puff into the lungs every 6 (six) hours as needed for wheezing or shortness of breath. ?  ?traZODone 100 MG tablet ?Commonly known as: DESYREL ?Take 100 mg by mouth at bedtime. ?  ? ?  ? ? Follow-up Information   ? ? Monico Blitz, MD. Schedule an appointment as soon as possible for a visit in 1 week(s).   ?Specialty: Internal Medicine ?Contact information: ?[address] ?[place] [address] ?[phone] ? ? ?  ?  ? ?  ?  ? ?  ? ?Discharge Exam: ?Filed Weights  ? 02/14/22 1749 02/14/22 1817 02/14/22 2032  ?Weight: 31.2 kg 34 kg 34 kg  ? ?General exam: Appears calm and comfortable  ?Respiratory system: Clear to auscultation. Respiratory effort normal. ?Cardiovascular system: S1 & S2 heard, RRR. No JVD,  No pedal edema. ?Gastrointestinal system: Abdomen is nondistended, soft and nontender.  Normal bowel sounds heard. ?Central nervous system: Alert and oriented. No focal neurological deficits. ?Extremities: Symmetric 5 x 5 power. ?Skin: No rashes, lesions or ulcers ?Psychiatry:  Mood & affect appropriate.  ? ? ?Condition at discharge: fair ? ?The results of significant diagnostics from this hospitalization (including imaging, microbiology, ancillary and laboratory) are listed below for  reference.  ? ?Imaging Studies: ?CT Head Wo Contrast ? ?Result Date: 02/14/2022 ?CLINICAL DATA:  Minor head trauma.  Patient was found down. EXAM: CT HEAD WITHOUT CONTRAST TECHNIQUE: Contiguous axial images were obtained from the base of the skull through the vertex without intravenous contrast. RADIATION DOSE REDUCTION: This exam was performed according to the departmental dose-optimization program which includes automated exposure control, adjustment of the mA and/or kV according to patient size and/or use of iterative reconstruction technique. COMPARISON:  08/31/2021 FINDINGS: Brain: No evidence of acute infarction, hemorrhage, hydrocephalus, extra-axial collection or mass lesion/mass effect. Mild cerebral atrophy. Vascular: Moderate intracranial arterial vascular calcification. Skull: Calvarium appears intact. Sinuses/Orbits: Paranasal sinuses and mastoid air cells are clear. Other: None. IMPRESSION: No acute intracranial abnormalities.  Mild chronic cerebral atrophy. Electronically Signed   By: Lucienne Capers M.D.   On: 02/14/2022 03:10  ? ?  CT Cervical Spine Wo Contrast ? ?Result Date: 02/14/2022 ?CLINICAL DATA:  Neck trauma.  Patient found down. EXAM: CT CERVICAL SPINE WITHOUT CONTRAST TECHNIQUE: Multidetector CT imaging of the cervical spine was performed without intravenous contrast. Multiplanar CT image reconstructions were also generated. RADIATION DOSE REDUCTION: This exam was performed according to the departmental dose-optimization program which includes automated exposure control, adjustment of the mA and/or kV according to patient size and/or use of iterative reconstruction technique. COMPARISON:  None. FINDINGS: Alignment: Normal alignment. Skull base and vertebrae: Skull base appears intact. No vertebral compression deformities. No focal bone lesion or bone destruction. Soft tissues and spinal canal: No prevertebral soft tissue swelling. No abnormal paraspinal soft tissue mass or infiltration.  Vascular calcifications. Disc levels: Degenerative changes with disc space narrowing and endplate osteophyte formation most prominent at C5-6 level. Upper chest: Emphysematous changes in the lung apices. Visualized esophagus is

## 2022-02-16 NOTE — Progress Notes (Signed)
[facility] was notified that the pain management is not working notified via chat   ?

## 2022-02-17 ENCOUNTER — Encounter: Payer: Self-pay | Admitting: *Deleted

## 2022-02-21 DIAGNOSIS — G4710 Hypersomnia, unspecified: Secondary | ICD-10-CM | POA: Diagnosis not present

## 2022-02-21 DIAGNOSIS — F1090 Alcohol use, unspecified, uncomplicated: Secondary | ICD-10-CM | POA: Diagnosis not present

## 2022-02-21 DIAGNOSIS — R627 Adult failure to thrive: Secondary | ICD-10-CM | POA: Diagnosis not present

## 2022-02-21 DIAGNOSIS — R4020 Unspecified coma: Secondary | ICD-10-CM | POA: Diagnosis not present

## 2022-02-21 DIAGNOSIS — R0689 Other abnormalities of breathing: Secondary | ICD-10-CM | POA: Diagnosis not present

## 2022-02-21 DIAGNOSIS — E43 Unspecified severe protein-calorie malnutrition: Secondary | ICD-10-CM | POA: Diagnosis not present

## 2022-02-21 DIAGNOSIS — R110 Nausea: Secondary | ICD-10-CM | POA: Diagnosis not present

## 2022-02-21 DIAGNOSIS — Z880 Allergy status to penicillin: Secondary | ICD-10-CM | POA: Diagnosis not present

## 2022-02-21 DIAGNOSIS — D649 Anemia, unspecified: Secondary | ICD-10-CM | POA: Diagnosis not present

## 2022-02-21 DIAGNOSIS — Z720 Tobacco use: Secondary | ICD-10-CM | POA: Diagnosis not present

## 2022-02-21 DIAGNOSIS — R739 Hyperglycemia, unspecified: Secondary | ICD-10-CM | POA: Diagnosis not present

## 2022-02-21 DIAGNOSIS — Z743 Need for continuous supervision: Secondary | ICD-10-CM | POA: Diagnosis not present

## 2022-02-21 DIAGNOSIS — H1131 Conjunctival hemorrhage, right eye: Secondary | ICD-10-CM | POA: Diagnosis not present

## 2022-02-21 DIAGNOSIS — Z7951 Long term (current) use of inhaled steroids: Secondary | ICD-10-CM | POA: Diagnosis not present

## 2022-02-21 DIAGNOSIS — F17210 Nicotine dependence, cigarettes, uncomplicated: Secondary | ICD-10-CM | POA: Diagnosis not present

## 2022-02-21 DIAGNOSIS — R4182 Altered mental status, unspecified: Secondary | ICD-10-CM | POA: Diagnosis not present

## 2022-02-21 DIAGNOSIS — F502 Bulimia nervosa: Secondary | ICD-10-CM | POA: Diagnosis not present

## 2022-02-21 DIAGNOSIS — I4510 Unspecified right bundle-branch block: Secondary | ICD-10-CM | POA: Diagnosis not present

## 2022-02-21 DIAGNOSIS — E162 Hypoglycemia, unspecified: Secondary | ICD-10-CM | POA: Diagnosis not present

## 2022-02-21 DIAGNOSIS — Z681 Body mass index (BMI) 19 or less, adult: Secondary | ICD-10-CM | POA: Diagnosis not present

## 2022-02-21 DIAGNOSIS — J441 Chronic obstructive pulmonary disease with (acute) exacerbation: Secondary | ICD-10-CM | POA: Diagnosis not present

## 2022-02-21 DIAGNOSIS — I499 Cardiac arrhythmia, unspecified: Secondary | ICD-10-CM | POA: Diagnosis not present

## 2022-02-21 DIAGNOSIS — R1110 Vomiting, unspecified: Secondary | ICD-10-CM | POA: Diagnosis not present

## 2022-02-21 DIAGNOSIS — E876 Hypokalemia: Secondary | ICD-10-CM | POA: Diagnosis not present

## 2022-02-21 DIAGNOSIS — T50905A Adverse effect of unspecified drugs, medicaments and biological substances, initial encounter: Secondary | ICD-10-CM | POA: Diagnosis not present

## 2022-02-21 DIAGNOSIS — F19921 Other psychoactive substance use, unspecified with intoxication with delirium: Secondary | ICD-10-CM | POA: Diagnosis not present

## 2022-02-21 DIAGNOSIS — R69 Illness, unspecified: Secondary | ICD-10-CM | POA: Diagnosis not present

## 2022-02-21 DIAGNOSIS — Z20822 Contact with and (suspected) exposure to covid-19: Secondary | ICD-10-CM | POA: Diagnosis not present

## 2022-02-21 DIAGNOSIS — Z8659 Personal history of other mental and behavioral disorders: Secondary | ICD-10-CM | POA: Diagnosis not present

## 2022-02-28 DIAGNOSIS — R627 Adult failure to thrive: Secondary | ICD-10-CM | POA: Diagnosis not present

## 2022-02-28 DIAGNOSIS — G4710 Hypersomnia, unspecified: Secondary | ICD-10-CM | POA: Diagnosis not present

## 2022-02-28 DIAGNOSIS — R739 Hyperglycemia, unspecified: Secondary | ICD-10-CM | POA: Diagnosis not present

## 2022-02-28 DIAGNOSIS — R4182 Altered mental status, unspecified: Secondary | ICD-10-CM | POA: Diagnosis not present

## 2022-02-28 DIAGNOSIS — R69 Illness, unspecified: Secondary | ICD-10-CM | POA: Diagnosis not present

## 2022-02-28 DIAGNOSIS — H1131 Conjunctival hemorrhage, right eye: Secondary | ICD-10-CM | POA: Diagnosis not present

## 2022-02-28 DIAGNOSIS — E876 Hypokalemia: Secondary | ICD-10-CM | POA: Diagnosis not present

## 2022-02-28 DIAGNOSIS — E43 Unspecified severe protein-calorie malnutrition: Secondary | ICD-10-CM | POA: Diagnosis not present

## 2022-03-05 DIAGNOSIS — K2100 Gastro-esophageal reflux disease with esophagitis, without bleeding: Secondary | ICD-10-CM | POA: Diagnosis not present

## 2022-03-05 DIAGNOSIS — R001 Bradycardia, unspecified: Secondary | ICD-10-CM | POA: Diagnosis not present

## 2022-03-05 DIAGNOSIS — F419 Anxiety disorder, unspecified: Secondary | ICD-10-CM | POA: Diagnosis not present

## 2022-03-05 DIAGNOSIS — M800AXD Age-related osteoporosis with current pathological fracture, other site, subsequent encounter for fracture with routine healing: Secondary | ICD-10-CM | POA: Diagnosis not present

## 2022-03-05 DIAGNOSIS — Z9049 Acquired absence of other specified parts of digestive tract: Secondary | ICD-10-CM | POA: Diagnosis not present

## 2022-03-05 DIAGNOSIS — E876 Hypokalemia: Secondary | ICD-10-CM | POA: Diagnosis not present

## 2022-03-05 DIAGNOSIS — Z9181 History of falling: Secondary | ICD-10-CM | POA: Diagnosis not present

## 2022-03-05 DIAGNOSIS — R1310 Dysphagia, unspecified: Secondary | ICD-10-CM | POA: Diagnosis not present

## 2022-03-05 DIAGNOSIS — M0600 Rheumatoid arthritis without rheumatoid factor, unspecified site: Secondary | ICD-10-CM | POA: Diagnosis not present

## 2022-03-05 DIAGNOSIS — R627 Adult failure to thrive: Secondary | ICD-10-CM | POA: Diagnosis not present

## 2022-03-05 DIAGNOSIS — I4510 Unspecified right bundle-branch block: Secondary | ICD-10-CM | POA: Diagnosis not present

## 2022-03-05 DIAGNOSIS — D649 Anemia, unspecified: Secondary | ICD-10-CM | POA: Diagnosis not present

## 2022-03-05 DIAGNOSIS — F17210 Nicotine dependence, cigarettes, uncomplicated: Secondary | ICD-10-CM | POA: Diagnosis not present

## 2022-03-05 DIAGNOSIS — K449 Diaphragmatic hernia without obstruction or gangrene: Secondary | ICD-10-CM | POA: Diagnosis not present

## 2022-03-05 DIAGNOSIS — B9620 Unspecified Escherichia coli [E. coli] as the cause of diseases classified elsewhere: Secondary | ICD-10-CM | POA: Diagnosis not present

## 2022-03-05 DIAGNOSIS — N390 Urinary tract infection, site not specified: Secondary | ICD-10-CM | POA: Diagnosis not present

## 2022-03-05 DIAGNOSIS — E43 Unspecified severe protein-calorie malnutrition: Secondary | ICD-10-CM | POA: Diagnosis not present

## 2022-03-05 DIAGNOSIS — J449 Chronic obstructive pulmonary disease, unspecified: Secondary | ICD-10-CM | POA: Diagnosis not present

## 2022-03-05 DIAGNOSIS — Z8701 Personal history of pneumonia (recurrent): Secondary | ICD-10-CM | POA: Diagnosis not present

## 2022-03-05 DIAGNOSIS — R69 Illness, unspecified: Secondary | ICD-10-CM | POA: Diagnosis not present

## 2022-03-05 DIAGNOSIS — Z8711 Personal history of peptic ulcer disease: Secondary | ICD-10-CM | POA: Diagnosis not present

## 2022-03-05 DIAGNOSIS — G8929 Other chronic pain: Secondary | ICD-10-CM | POA: Diagnosis not present

## 2022-03-05 DIAGNOSIS — F32A Depression, unspecified: Secondary | ICD-10-CM | POA: Diagnosis not present

## 2022-03-08 DIAGNOSIS — E538 Deficiency of other specified B group vitamins: Secondary | ICD-10-CM | POA: Diagnosis not present

## 2022-03-08 DIAGNOSIS — J449 Chronic obstructive pulmonary disease, unspecified: Secondary | ICD-10-CM | POA: Diagnosis not present

## 2022-03-08 DIAGNOSIS — E43 Unspecified severe protein-calorie malnutrition: Secondary | ICD-10-CM | POA: Diagnosis not present

## 2022-03-08 DIAGNOSIS — Z681 Body mass index (BMI) 19 or less, adult: Secondary | ICD-10-CM | POA: Diagnosis not present

## 2022-03-08 DIAGNOSIS — R69 Illness, unspecified: Secondary | ICD-10-CM | POA: Diagnosis not present

## 2022-03-08 DIAGNOSIS — Z299 Encounter for prophylactic measures, unspecified: Secondary | ICD-10-CM | POA: Diagnosis not present

## 2022-03-08 DIAGNOSIS — F17210 Nicotine dependence, cigarettes, uncomplicated: Secondary | ICD-10-CM | POA: Diagnosis not present

## 2022-03-16 DIAGNOSIS — R69 Illness, unspecified: Secondary | ICD-10-CM | POA: Diagnosis not present

## 2022-03-16 DIAGNOSIS — E876 Hypokalemia: Secondary | ICD-10-CM | POA: Diagnosis not present

## 2022-03-16 DIAGNOSIS — N390 Urinary tract infection, site not specified: Secondary | ICD-10-CM | POA: Diagnosis not present

## 2022-03-16 DIAGNOSIS — R627 Adult failure to thrive: Secondary | ICD-10-CM | POA: Diagnosis not present

## 2022-03-26 DIAGNOSIS — M8440XA Pathological fracture, unspecified site, initial encounter for fracture: Secondary | ICD-10-CM | POA: Diagnosis not present

## 2022-03-27 DIAGNOSIS — R64 Cachexia: Secondary | ICD-10-CM | POA: Diagnosis not present

## 2022-03-27 DIAGNOSIS — Z9981 Dependence on supplemental oxygen: Secondary | ICD-10-CM | POA: Diagnosis not present

## 2022-03-27 DIAGNOSIS — Z20822 Contact with and (suspected) exposure to covid-19: Secondary | ICD-10-CM | POA: Diagnosis not present

## 2022-03-27 DIAGNOSIS — S3210XA Unspecified fracture of sacrum, initial encounter for closed fracture: Secondary | ICD-10-CM | POA: Diagnosis not present

## 2022-03-27 DIAGNOSIS — M50322 Other cervical disc degeneration at C5-C6 level: Secondary | ICD-10-CM | POA: Diagnosis not present

## 2022-03-27 DIAGNOSIS — J8489 Other specified interstitial pulmonary diseases: Secondary | ICD-10-CM | POA: Diagnosis not present

## 2022-03-27 DIAGNOSIS — J189 Pneumonia, unspecified organism: Secondary | ICD-10-CM | POA: Diagnosis not present

## 2022-03-27 DIAGNOSIS — R918 Other nonspecific abnormal finding of lung field: Secondary | ICD-10-CM | POA: Diagnosis not present

## 2022-03-27 DIAGNOSIS — Y832 Surgical operation with anastomosis, bypass or graft as the cause of abnormal reaction of the patient, or of later complication, without mention of misadventure at the time of the procedure: Secondary | ICD-10-CM | POA: Diagnosis not present

## 2022-03-27 DIAGNOSIS — R4182 Altered mental status, unspecified: Secondary | ICD-10-CM | POA: Diagnosis not present

## 2022-03-27 DIAGNOSIS — F502 Bulimia nervosa: Secondary | ICD-10-CM | POA: Diagnosis not present

## 2022-03-27 DIAGNOSIS — S32511D Fracture of superior rim of right pubis, subsequent encounter for fracture with routine healing: Secondary | ICD-10-CM | POA: Diagnosis not present

## 2022-03-27 DIAGNOSIS — I700 Atherosclerosis of aorta: Secondary | ICD-10-CM | POA: Diagnosis not present

## 2022-03-27 DIAGNOSIS — Z681 Body mass index (BMI) 19 or less, adult: Secondary | ICD-10-CM | POA: Diagnosis not present

## 2022-03-27 DIAGNOSIS — R69 Illness, unspecified: Secondary | ICD-10-CM | POA: Diagnosis not present

## 2022-03-27 DIAGNOSIS — D649 Anemia, unspecified: Secondary | ICD-10-CM | POA: Diagnosis not present

## 2022-03-27 DIAGNOSIS — E876 Hypokalemia: Secondary | ICD-10-CM | POA: Diagnosis not present

## 2022-03-27 DIAGNOSIS — Z792 Long term (current) use of antibiotics: Secondary | ICD-10-CM | POA: Diagnosis not present

## 2022-03-27 DIAGNOSIS — Z7951 Long term (current) use of inhaled steroids: Secondary | ICD-10-CM | POA: Diagnosis not present

## 2022-03-27 DIAGNOSIS — G9341 Metabolic encephalopathy: Secondary | ICD-10-CM | POA: Diagnosis not present

## 2022-03-27 DIAGNOSIS — E43 Unspecified severe protein-calorie malnutrition: Secondary | ICD-10-CM | POA: Diagnosis not present

## 2022-03-27 DIAGNOSIS — J449 Chronic obstructive pulmonary disease, unspecified: Secondary | ICD-10-CM | POA: Diagnosis not present

## 2022-03-27 DIAGNOSIS — R1111 Vomiting without nausea: Secondary | ICD-10-CM | POA: Diagnosis not present

## 2022-03-27 DIAGNOSIS — K219 Gastro-esophageal reflux disease without esophagitis: Secondary | ICD-10-CM | POA: Diagnosis not present

## 2022-03-27 DIAGNOSIS — R0902 Hypoxemia: Secondary | ICD-10-CM | POA: Diagnosis not present

## 2022-03-27 DIAGNOSIS — R531 Weakness: Secondary | ICD-10-CM | POA: Diagnosis not present

## 2022-03-27 DIAGNOSIS — R079 Chest pain, unspecified: Secondary | ICD-10-CM | POA: Diagnosis not present

## 2022-03-27 DIAGNOSIS — R404 Transient alteration of awareness: Secondary | ICD-10-CM | POA: Diagnosis not present

## 2022-03-27 DIAGNOSIS — R109 Unspecified abdominal pain: Secondary | ICD-10-CM | POA: Diagnosis not present

## 2022-03-27 DIAGNOSIS — R0600 Dyspnea, unspecified: Secondary | ICD-10-CM | POA: Diagnosis not present

## 2022-03-27 DIAGNOSIS — E162 Hypoglycemia, unspecified: Secondary | ICD-10-CM | POA: Diagnosis not present

## 2022-03-27 DIAGNOSIS — J690 Pneumonitis due to inhalation of food and vomit: Secondary | ICD-10-CM | POA: Diagnosis not present

## 2022-03-27 DIAGNOSIS — S32509D Unspecified fracture of unspecified pubis, subsequent encounter for fracture with routine healing: Secondary | ICD-10-CM | POA: Diagnosis not present

## 2022-03-27 DIAGNOSIS — K9189 Other postprocedural complications and disorders of digestive system: Secondary | ICD-10-CM | POA: Diagnosis not present

## 2022-03-27 DIAGNOSIS — Z743 Need for continuous supervision: Secondary | ICD-10-CM | POA: Diagnosis not present

## 2022-03-27 DIAGNOSIS — F17210 Nicotine dependence, cigarettes, uncomplicated: Secondary | ICD-10-CM | POA: Diagnosis not present

## 2022-03-27 DIAGNOSIS — I499 Cardiac arrhythmia, unspecified: Secondary | ICD-10-CM | POA: Diagnosis not present

## 2022-03-27 DIAGNOSIS — M069 Rheumatoid arthritis, unspecified: Secondary | ICD-10-CM | POA: Diagnosis not present

## 2022-03-27 DIAGNOSIS — R54 Age-related physical debility: Secondary | ICD-10-CM | POA: Diagnosis not present

## 2022-03-27 DIAGNOSIS — I447 Left bundle-branch block, unspecified: Secondary | ICD-10-CM | POA: Diagnosis not present

## 2022-03-27 DIAGNOSIS — R627 Adult failure to thrive: Secondary | ICD-10-CM | POA: Diagnosis not present

## 2022-03-28 DIAGNOSIS — J690 Pneumonitis due to inhalation of food and vomit: Secondary | ICD-10-CM | POA: Diagnosis not present

## 2022-03-28 DIAGNOSIS — E43 Unspecified severe protein-calorie malnutrition: Secondary | ICD-10-CM | POA: Diagnosis not present

## 2022-03-28 DIAGNOSIS — J449 Chronic obstructive pulmonary disease, unspecified: Secondary | ICD-10-CM | POA: Diagnosis not present

## 2022-03-28 DIAGNOSIS — Z681 Body mass index (BMI) 19 or less, adult: Secondary | ICD-10-CM | POA: Diagnosis not present

## 2022-03-28 DIAGNOSIS — E162 Hypoglycemia, unspecified: Secondary | ICD-10-CM | POA: Diagnosis not present

## 2022-03-28 DIAGNOSIS — Z792 Long term (current) use of antibiotics: Secondary | ICD-10-CM | POA: Diagnosis not present

## 2022-03-28 DIAGNOSIS — R4182 Altered mental status, unspecified: Secondary | ICD-10-CM | POA: Diagnosis not present

## 2022-03-29 DIAGNOSIS — J690 Pneumonitis due to inhalation of food and vomit: Secondary | ICD-10-CM | POA: Diagnosis not present

## 2022-03-29 DIAGNOSIS — J449 Chronic obstructive pulmonary disease, unspecified: Secondary | ICD-10-CM | POA: Diagnosis not present

## 2022-03-29 DIAGNOSIS — R69 Illness, unspecified: Secondary | ICD-10-CM | POA: Diagnosis not present

## 2022-03-29 DIAGNOSIS — Z681 Body mass index (BMI) 19 or less, adult: Secondary | ICD-10-CM | POA: Diagnosis not present

## 2022-03-29 DIAGNOSIS — R4182 Altered mental status, unspecified: Secondary | ICD-10-CM | POA: Diagnosis not present

## 2022-03-29 DIAGNOSIS — Z9981 Dependence on supplemental oxygen: Secondary | ICD-10-CM | POA: Diagnosis not present

## 2022-03-29 DIAGNOSIS — E43 Unspecified severe protein-calorie malnutrition: Secondary | ICD-10-CM | POA: Diagnosis not present

## 2022-03-29 DIAGNOSIS — E162 Hypoglycemia, unspecified: Secondary | ICD-10-CM | POA: Diagnosis not present

## 2022-03-30 DIAGNOSIS — R4182 Altered mental status, unspecified: Secondary | ICD-10-CM | POA: Diagnosis not present

## 2022-03-30 DIAGNOSIS — J449 Chronic obstructive pulmonary disease, unspecified: Secondary | ICD-10-CM | POA: Diagnosis not present

## 2022-03-30 DIAGNOSIS — Z681 Body mass index (BMI) 19 or less, adult: Secondary | ICD-10-CM | POA: Diagnosis not present

## 2022-03-30 DIAGNOSIS — Z792 Long term (current) use of antibiotics: Secondary | ICD-10-CM | POA: Diagnosis not present

## 2022-03-30 DIAGNOSIS — E162 Hypoglycemia, unspecified: Secondary | ICD-10-CM | POA: Diagnosis not present

## 2022-03-30 DIAGNOSIS — J690 Pneumonitis due to inhalation of food and vomit: Secondary | ICD-10-CM | POA: Diagnosis not present

## 2022-03-30 DIAGNOSIS — E43 Unspecified severe protein-calorie malnutrition: Secondary | ICD-10-CM | POA: Diagnosis not present

## 2022-03-30 DIAGNOSIS — Z9981 Dependence on supplemental oxygen: Secondary | ICD-10-CM | POA: Diagnosis not present

## 2022-03-31 ENCOUNTER — Ambulatory Visit: Payer: Medicare HMO | Admitting: Internal Medicine

## 2022-03-31 NOTE — Progress Notes (Deleted)
Cardiology Office Note:    Date:  03/31/2022   ID:  Kristine Roberts, DOB 28-Jan-1956, MRN [id number]  PCP:  Monico Blitz, MD   [facility] Providers Cardiologist:  None { Click to update primary MD,subspecialty MD or APP then REFRESH:1}    Referring MD: Monico Blitz, MD   No chief complaint on file. ***  History of Present Illness:    Kristine Roberts is a 66 y.o. female with a hx of COPD, smoker, GERD, referral for syncope  Kristine Roberts was seen in the hospital.  She had AMS and UTI. She was noted to have sinus bradycardia. Heart rates in the 40s with RBBB.  Patient had a 30 day event monitor that was not done. She had an echo that showed normal LV and RV function. No valve disease.   Past Medical History:  Diagnosis Date   COPD (chronic obstructive pulmonary disease) ([facility])    Dysphagia    Dysrhythmia    Esophagitis    Gastric outlet obstruction    Gastric stenosis    GERD (gastroesophageal reflux disease)    Hiatal hernia    Osteoporosis    PUD (peptic ulcer disease)    Wounds, multiple     Past Surgical History:  Procedure Laterality Date   APPENDECTOMY     BALLOON DILATION N/A 03/05/2016   Procedure: BALLOON DILATION;  Surgeon: Rogene Houston, MD;  [facility];  Service: Endoscopy;  Laterality: N/A;  pyloric channel dialtaion   BALLOON DILATION N/A 07/14/2018   Procedure: BALLOON DILATION;  Surgeon: Jackquline Denmark, MD;  Location: [facility];  Service: Endoscopy;  Laterality: N/A;   BALLOON DILATION  10/03/2020   Procedure: BALLOON DILATION;  Surgeon: Montez Morita, Quillian Quince, MD;  [facility];  Service: Gastroenterology;;   Larrie Kass DILATION  12/19/2020   Procedure: [place];  Surgeon: Montez Morita, Quillian Quince, MD;  [facility];  Service: Gastroenterology;;   BIOPSY  07/14/2018   Procedure: BIOPSY;  Surgeon: Jackquline Denmark, MD;  Location[facility];  Service: Endoscopy;;   BIOPSY  07/03/2020   Procedure: BIOPSY;   Surgeon: Lavena Bullion, DO;  Location: [place];  Service: Gastroenterology;;   CHOLECYSTECTOMY     COLONOSCOPY WITH PROPOFOL N/A 08/01/2020   Procedure: COLONOSCOPY WITH PROPOFOL;  Surgeon: Harvel Quale, MD;  [facility];  Service: Gastroenterology;  Laterality: N/A;  1130   ESOPHAGEAL DILATION N/A 03/03/2016   Procedure: ESOPHAGEAL DILATION;  Surgeon: Rogene Houston, MD;  [facility];  Service: Endoscopy;  Laterality: N/A;   ESOPHAGEAL DILATION N/A 05/07/2016   Procedure: ESOPHAGEAL DILATION;  Surgeon: Rogene Houston, MD;  [facility];  Service: Endoscopy;  Laterality: N/A;   ESOPHAGEAL DILATION  01/26/2018   Procedure: DILATION OF ANASTOMOTIC  STRICTURE;  Surgeon: Rogene Houston, MD;  [facility];  Service: Endoscopy;;   ESOPHAGEAL DILATION N/A 08/01/2020   Procedure: ESOPHAGEAL DILATION;  Surgeon: Harvel Quale, MD;  [facility];  Service: Gastroenterology;  Laterality: N/A;   ESOPHAGOGASTRODUODENOSCOPY N/A 03/03/2016   Procedure: ESOPHAGOGASTRODUODENOSCOPY (EGD);  Surgeon: Rogene Houston, MD;  [facility];  Service: Endoscopy;  Laterality: N/A;  2:00   ESOPHAGOGASTRODUODENOSCOPY N/A 05/07/2016   Procedure: ESOPHAGOGASTRODUODENOSCOPY (EGD);  Surgeon: Rogene Houston, MD;  [facility];  Service: Endoscopy;  Laterality: N/A;  855 - moved to 6/2 @ 10:15 - Ann notified pt   ESOPHAGOGASTRODUODENOSCOPY N/A 12/28/2017  Procedure: ESOPHAGOGASTRODUODENOSCOPY (EGD) with stricture dilation;  Surgeon: Rogene Houston, MD;  [facility];  Service: Endoscopy;  Laterality: N/A;   ESOPHAGOGASTRODUODENOSCOPY N/A 01/26/2018   Procedure: ESOPHAGOGASTRODUODENOSCOPY (EGD);  Surgeon: Rogene Houston, MD;  [facility];  Service: Endoscopy;  Laterality: N/A;  255   ESOPHAGOGASTRODUODENOSCOPY N/A 07/14/2018   Procedure: ESOPHAGOGASTRODUODENOSCOPY (EGD);  Surgeon: Jackquline Denmark, MD;   Location[facility];  Service: Endoscopy;  Laterality: N/A;   ESOPHAGOGASTRODUODENOSCOPY (EGD) WITH PROPOFOL N/A 03/05/2016   Procedure: ESOPHAGOGASTRODUODENOSCOPY (EGD) WITH PROPOFOL Anastomotic stricture dilation ;  Surgeon: Rogene Houston, MD;  [facility];  Service: Endoscopy;  Laterality: N/A;  to be done in OR under fluoro   ESOPHAGOGASTRODUODENOSCOPY (EGD) WITH PROPOFOL N/A 08/22/2019   Procedure: ESOPHAGOGASTRODUODENOSCOPY (EGD) WITH PROPOFOL With anastomotic stricture dilation.;  Surgeon: Rogene Houston, MD;  [facility];  Service: Endoscopy;  Laterality: N/A;   ESOPHAGOGASTRODUODENOSCOPY (EGD) WITH PROPOFOL N/A 08/24/2019   Procedure: ESOPHAGOGASTRODUODENOSCOPY (EGD) WITH PROPOFOL with stricture dilation;  Surgeon: Rogene Houston, MD;  [facility];  Service: Endoscopy;  Laterality: N/A;   ESOPHAGOGASTRODUODENOSCOPY (EGD) WITH PROPOFOL N/A 07/03/2020   Procedure: ESOPHAGOGASTRODUODENOSCOPY (EGD) WITH PROPOFOL;  Surgeon: Lavena Bullion, DO;  Location: [place];  Service: Gastroenterology;  Laterality: N/A;   ESOPHAGOGASTRODUODENOSCOPY (EGD) WITH PROPOFOL N/A 08/01/2020   Procedure: ESOPHAGOGASTRODUODENOSCOPY (EGD) WITH PROPOFOL;  Surgeon: Harvel Quale, MD;  [facility];  Service: Gastroenterology;  Laterality: N/A;   ESOPHAGOGASTRODUODENOSCOPY (EGD) WITH PROPOFOL N/A 08/29/2020   Procedure: ESOPHAGOGASTRODUODENOSCOPY (EGD) WITH PROPOFOL;  Surgeon: Harvel Quale, MD;  [facility];  Service: Gastroenterology;  Laterality: N/A;  10:00   ESOPHAGOGASTRODUODENOSCOPY (EGD) WITH PROPOFOL N/A 10/03/2020   Procedure: ESOPHAGOGASTRODUODENOSCOPY (EGD) WITH PROPOFOL;  Surgeon: Harvel Quale, MD;  [facility];  Service: Gastroenterology;  Laterality: N/A;  200   ESOPHAGOGASTRODUODENOSCOPY (EGD) WITH PROPOFOL N/A 12/19/2020   Procedure: ESOPHAGOGASTRODUODENOSCOPY (EGD) WITH PROPOFOL;   Surgeon: Harvel Quale, MD;  [facility];  Service: Gastroenterology;  Laterality: N/A;  [place] BODY REMOVAL  07/03/2020   Procedure: FOREIGN BODY REMOVAL;  Surgeon: Lavena Bullion, DO;  Location: [place] [facility];  Service: Gastroenterology;;   HIP SURGERY Right 09/2020   HIP SURGERY Left 07/2020   INTRAMEDULLARY (IM) NAIL INTERTROCHANTERIC Right 07/09/2018   Procedure: INTRAMEDULLARY (IM) NAIL INTERTROCHANTRIC;  Surgeon: Nicholes Stairs, MD;  Location: [place];  Service: Orthopedics;  Laterality: Right;   INTRAMEDULLARY (IM) NAIL INTERTROCHANTERIC Left 06/27/2020   Procedure: INTRAMEDULLARY (IM) NAIL INTERTROCHANTRIC;  Surgeon: Nicholes Stairs, MD;  Location: [place];  Service: Orthopedics;  Laterality: Left;   KENALOG INJECTION  07/03/2020   Procedure: KENALOG INJECTION;  Surgeon: Lavena Bullion, DO;  Location: [facility];  Service: Gastroenterology;;   STOMACH SURGERY     TOTAL HIP ARTHROPLASTY Bilateral     Current Medications: No outpatient medications have been marked as taking for the 03/31/22 encounter (Appointment) with Janina Mayo, MD.     Allergies:   Penicillins   Social History   Socioeconomic History   Marital status: Widowed    Spouse name: Not on file   Number of children: Not on file   Years of education: Not on file   Highest education level: Not on file  Occupational History   Not on file  Tobacco Use   Smoking status: Every Day    Packs/day: 1.50    Years: 20.00  Pack years: 30.00    Types: Cigarettes   Smokeless tobacco: Never  Vaping Use   Vaping Use: Never used  Substance and Sexual Activity   Alcohol use: Not Currently    Alcohol/week: 12.0 standard drinks    Types: 12 Cans of beer per week    Comment: weekly   Drug use: No   Sexual activity: Yes    Birth control/protection: None  Other Topics Concern   Not on file  Social History Narrative   Not on file   Social Determinants of Health    Financial Resource Strain: Not on file  Food Insecurity: Not on file  Transportation Needs: Not on file  Physical Activity: Not on file  Stress: Not on file  Social Connections: Not on file     Family History: The patient's ***family history includes Alzheimer's disease in her mother; Breast cancer in her sister; COPD in her mother; Throat cancer in her father.  ROS:   Please see the history of present illness.    *** All other systems reviewed and are negative.  EKGs/Labs/Other Studies Reviewed:    The following studies were reviewed today: ***  EKG:  EKG is *** ordered today.  The ekg ordered today demonstrates ***  Recent Labs: 02/14/2022: ALT 8; TSH 5.125 02/15/2022: BUN 8; Creatinine, Ser 0.50; Hemoglobin 11.0; Magnesium 1.7; Platelets 170; Potassium 3.8; Sodium 142  Recent Lipid Panel    Component Value Date/Time   TRIG 56 08/27/2019 0624     Risk Assessment/Calculations:   {Does this patient have ATRIAL FIBRILLATION?:[phone]}       Physical Exam:    VS:  There were no vitals taken for this visit.    Wt Readings from Last 3 Encounters:  02/14/22 74 lb 15.3 oz (34 kg)  12/29/21 75 lb (34 kg)  12/17/20 79 lb (35.8 kg)     GEN: *** Well nourished, well developed in no acute distress HEENT: Normal NECK: No JVD; No carotid bruits LYMPHATICS: No lymphadenopathy CARDIAC: ***RRR, no murmurs, rubs, gallops RESPIRATORY:  Clear to auscultation without rales, wheezing or rhonchi  ABDOMEN: Soft, non-tender, non-distended MUSCULOSKELETAL:  No edema; No deformity  SKIN: Warm and dry NEUROLOGIC:  Alert and oriented x 3 PSYCHIATRIC:  Normal affect   ASSESSMENT:   Syncope: no event monitor. She had bradycardia in the setting of AMS/UTI. Sinus bradycardia has ***. She reports no ***.  If she has recurrence of symptoms, she was told to let us know and can send PLAN:    In order of problems listed above:  ***      {Are you ordering a CV Procedure (e.g.  stress test, cath, DCCV, TEE, etc)?   Press F2        :[id number]    Medication Adjustments/Labs and Tests Ordered: Current medicines are reviewed at length with the patient today.  Concerns regarding medicines are outlined above.  No orders of the defined types were placed in this encounter.  No orders of the defined types were placed in this encounter.   There are no Patient Instructions on file for this visit.   Signed, Janina Mayo, MD  03/31/2022 7:51 AM    [facility]

## 2022-04-01 DIAGNOSIS — R4182 Altered mental status, unspecified: Secondary | ICD-10-CM | POA: Diagnosis not present

## 2022-04-01 DIAGNOSIS — R627 Adult failure to thrive: Secondary | ICD-10-CM | POA: Diagnosis not present

## 2022-04-01 DIAGNOSIS — R69 Illness, unspecified: Secondary | ICD-10-CM | POA: Diagnosis not present

## 2022-04-01 DIAGNOSIS — G9341 Metabolic encephalopathy: Secondary | ICD-10-CM | POA: Diagnosis not present

## 2022-04-01 DIAGNOSIS — E876 Hypokalemia: Secondary | ICD-10-CM | POA: Diagnosis not present

## 2022-04-01 DIAGNOSIS — Z8701 Personal history of pneumonia (recurrent): Secondary | ICD-10-CM | POA: Diagnosis not present

## 2022-04-01 DIAGNOSIS — E43 Unspecified severe protein-calorie malnutrition: Secondary | ICD-10-CM | POA: Diagnosis not present

## 2022-04-01 DIAGNOSIS — T17800D Unspecified foreign body in other parts of respiratory tract causing asphyxiation, subsequent encounter: Secondary | ICD-10-CM | POA: Diagnosis not present

## 2022-04-01 DIAGNOSIS — J449 Chronic obstructive pulmonary disease, unspecified: Secondary | ICD-10-CM | POA: Diagnosis not present

## 2022-04-01 DIAGNOSIS — E162 Hypoglycemia, unspecified: Secondary | ICD-10-CM | POA: Diagnosis not present

## 2022-04-05 ENCOUNTER — Ambulatory Visit (INDEPENDENT_AMBULATORY_CARE_PROVIDER_SITE_OTHER): Payer: Medicare HMO | Admitting: Gastroenterology

## 2022-04-05 DIAGNOSIS — Z299 Encounter for prophylactic measures, unspecified: Secondary | ICD-10-CM | POA: Diagnosis not present

## 2022-04-05 DIAGNOSIS — J449 Chronic obstructive pulmonary disease, unspecified: Secondary | ICD-10-CM | POA: Diagnosis not present

## 2022-04-05 DIAGNOSIS — R69 Illness, unspecified: Secondary | ICD-10-CM | POA: Diagnosis not present

## 2022-04-05 DIAGNOSIS — Z681 Body mass index (BMI) 19 or less, adult: Secondary | ICD-10-CM | POA: Diagnosis not present

## 2022-04-05 DIAGNOSIS — R609 Edema, unspecified: Secondary | ICD-10-CM | POA: Diagnosis not present

## 2022-04-07 DIAGNOSIS — M069 Rheumatoid arthritis, unspecified: Secondary | ICD-10-CM | POA: Diagnosis not present

## 2022-04-07 DIAGNOSIS — R69 Illness, unspecified: Secondary | ICD-10-CM | POA: Diagnosis not present

## 2022-04-07 DIAGNOSIS — R609 Edema, unspecified: Secondary | ICD-10-CM | POA: Diagnosis not present

## 2022-04-07 DIAGNOSIS — R627 Adult failure to thrive: Secondary | ICD-10-CM | POA: Diagnosis not present

## 2022-04-07 DIAGNOSIS — J439 Emphysema, unspecified: Secondary | ICD-10-CM | POA: Diagnosis not present

## 2022-04-07 DIAGNOSIS — R269 Unspecified abnormalities of gait and mobility: Secondary | ICD-10-CM | POA: Diagnosis not present

## 2022-04-07 DIAGNOSIS — J449 Chronic obstructive pulmonary disease, unspecified: Secondary | ICD-10-CM | POA: Diagnosis not present

## 2022-04-07 DIAGNOSIS — R0902 Hypoxemia: Secondary | ICD-10-CM | POA: Diagnosis not present

## 2022-04-07 DIAGNOSIS — L03115 Cellulitis of right lower limb: Secondary | ICD-10-CM | POA: Diagnosis not present

## 2022-04-07 DIAGNOSIS — R5383 Other fatigue: Secondary | ICD-10-CM | POA: Diagnosis not present

## 2022-04-07 DIAGNOSIS — F17200 Nicotine dependence, unspecified, uncomplicated: Secondary | ICD-10-CM | POA: Diagnosis not present

## 2022-04-07 DIAGNOSIS — F1090 Alcohol use, unspecified, uncomplicated: Secondary | ICD-10-CM | POA: Diagnosis not present

## 2022-04-07 DIAGNOSIS — E162 Hypoglycemia, unspecified: Secondary | ICD-10-CM | POA: Diagnosis not present

## 2022-04-07 DIAGNOSIS — J690 Pneumonitis due to inhalation of food and vomit: Secondary | ICD-10-CM | POA: Diagnosis not present

## 2022-04-07 DIAGNOSIS — Z880 Allergy status to penicillin: Secondary | ICD-10-CM | POA: Diagnosis not present

## 2022-04-07 DIAGNOSIS — G9341 Metabolic encephalopathy: Secondary | ICD-10-CM | POA: Diagnosis not present

## 2022-04-07 DIAGNOSIS — L0390 Cellulitis, unspecified: Secondary | ICD-10-CM | POA: Diagnosis not present

## 2022-04-07 DIAGNOSIS — Z9181 History of falling: Secondary | ICD-10-CM | POA: Diagnosis not present

## 2022-04-07 DIAGNOSIS — R634 Abnormal weight loss: Secondary | ICD-10-CM | POA: Diagnosis not present

## 2022-04-07 DIAGNOSIS — I10 Essential (primary) hypertension: Secondary | ICD-10-CM | POA: Diagnosis not present

## 2022-04-07 DIAGNOSIS — Z7951 Long term (current) use of inhaled steroids: Secondary | ICD-10-CM | POA: Diagnosis not present

## 2022-04-07 DIAGNOSIS — E876 Hypokalemia: Secondary | ICD-10-CM | POA: Diagnosis not present

## 2022-04-07 DIAGNOSIS — L03116 Cellulitis of left lower limb: Secondary | ICD-10-CM | POA: Diagnosis not present

## 2022-04-07 DIAGNOSIS — E46 Unspecified protein-calorie malnutrition: Secondary | ICD-10-CM | POA: Diagnosis not present

## 2022-04-07 DIAGNOSIS — Z743 Need for continuous supervision: Secondary | ICD-10-CM | POA: Diagnosis not present

## 2022-04-07 DIAGNOSIS — W19XXXD Unspecified fall, subsequent encounter: Secondary | ICD-10-CM | POA: Diagnosis not present

## 2022-04-07 DIAGNOSIS — M84350D Stress fracture, pelvis, subsequent encounter for fracture with routine healing: Secondary | ICD-10-CM | POA: Diagnosis not present

## 2022-04-07 DIAGNOSIS — M8438XD Stress fracture, other site, subsequent encounter for fracture with routine healing: Secondary | ICD-10-CM | POA: Diagnosis not present

## 2022-04-07 DIAGNOSIS — K219 Gastro-esophageal reflux disease without esophagitis: Secondary | ICD-10-CM | POA: Diagnosis not present

## 2022-04-07 DIAGNOSIS — R531 Weakness: Secondary | ICD-10-CM | POA: Diagnosis not present

## 2022-04-08 DIAGNOSIS — L03115 Cellulitis of right lower limb: Secondary | ICD-10-CM | POA: Diagnosis not present

## 2022-04-08 DIAGNOSIS — F17210 Nicotine dependence, cigarettes, uncomplicated: Secondary | ICD-10-CM | POA: Diagnosis not present

## 2022-04-08 DIAGNOSIS — R634 Abnormal weight loss: Secondary | ICD-10-CM | POA: Diagnosis not present

## 2022-04-08 DIAGNOSIS — R627 Adult failure to thrive: Secondary | ICD-10-CM | POA: Diagnosis not present

## 2022-04-08 DIAGNOSIS — J449 Chronic obstructive pulmonary disease, unspecified: Secondary | ICD-10-CM | POA: Diagnosis not present

## 2022-04-08 DIAGNOSIS — R609 Edema, unspecified: Secondary | ICD-10-CM | POA: Diagnosis not present

## 2022-04-08 DIAGNOSIS — E876 Hypokalemia: Secondary | ICD-10-CM | POA: Diagnosis not present

## 2022-04-08 DIAGNOSIS — M84350D Stress fracture, pelvis, subsequent encounter for fracture with routine healing: Secondary | ICD-10-CM | POA: Diagnosis not present

## 2022-04-08 DIAGNOSIS — G9341 Metabolic encephalopathy: Secondary | ICD-10-CM | POA: Diagnosis not present

## 2022-04-08 DIAGNOSIS — J690 Pneumonitis due to inhalation of food and vomit: Secondary | ICD-10-CM | POA: Diagnosis not present

## 2022-04-08 DIAGNOSIS — K219 Gastro-esophageal reflux disease without esophagitis: Secondary | ICD-10-CM | POA: Diagnosis not present

## 2022-04-08 DIAGNOSIS — L03116 Cellulitis of left lower limb: Secondary | ICD-10-CM | POA: Diagnosis not present

## 2022-04-08 DIAGNOSIS — F1090 Alcohol use, unspecified, uncomplicated: Secondary | ICD-10-CM | POA: Diagnosis not present

## 2022-04-08 DIAGNOSIS — E162 Hypoglycemia, unspecified: Secondary | ICD-10-CM | POA: Diagnosis not present

## 2022-04-08 DIAGNOSIS — M06049 Rheumatoid arthritis without rheumatoid factor, unspecified hand: Secondary | ICD-10-CM | POA: Diagnosis not present

## 2022-04-08 DIAGNOSIS — E46 Unspecified protein-calorie malnutrition: Secondary | ICD-10-CM | POA: Diagnosis not present

## 2022-04-08 DIAGNOSIS — Z681 Body mass index (BMI) 19 or less, adult: Secondary | ICD-10-CM | POA: Diagnosis not present

## 2022-04-08 DIAGNOSIS — M8438XD Stress fracture, other site, subsequent encounter for fracture with routine healing: Secondary | ICD-10-CM | POA: Diagnosis not present

## 2022-04-08 DIAGNOSIS — M069 Rheumatoid arthritis, unspecified: Secondary | ICD-10-CM | POA: Diagnosis not present

## 2022-04-08 DIAGNOSIS — Z7951 Long term (current) use of inhaled steroids: Secondary | ICD-10-CM | POA: Diagnosis not present

## 2022-04-08 DIAGNOSIS — Z299 Encounter for prophylactic measures, unspecified: Secondary | ICD-10-CM | POA: Diagnosis not present

## 2022-04-08 DIAGNOSIS — R69 Illness, unspecified: Secondary | ICD-10-CM | POA: Diagnosis not present

## 2022-04-08 DIAGNOSIS — J439 Emphysema, unspecified: Secondary | ICD-10-CM | POA: Diagnosis not present

## 2022-04-08 DIAGNOSIS — W19XXXD Unspecified fall, subsequent encounter: Secondary | ICD-10-CM | POA: Diagnosis not present

## 2022-04-08 DIAGNOSIS — F17200 Nicotine dependence, unspecified, uncomplicated: Secondary | ICD-10-CM | POA: Diagnosis not present

## 2022-04-12 DIAGNOSIS — L03116 Cellulitis of left lower limb: Secondary | ICD-10-CM | POA: Diagnosis not present

## 2022-04-12 DIAGNOSIS — J690 Pneumonitis due to inhalation of food and vomit: Secondary | ICD-10-CM | POA: Diagnosis not present

## 2022-04-12 DIAGNOSIS — E162 Hypoglycemia, unspecified: Secondary | ICD-10-CM | POA: Diagnosis not present

## 2022-04-12 DIAGNOSIS — F1090 Alcohol use, unspecified, uncomplicated: Secondary | ICD-10-CM | POA: Diagnosis not present

## 2022-04-12 DIAGNOSIS — J439 Emphysema, unspecified: Secondary | ICD-10-CM | POA: Diagnosis not present

## 2022-04-12 DIAGNOSIS — M8438XD Stress fracture, other site, subsequent encounter for fracture with routine healing: Secondary | ICD-10-CM | POA: Diagnosis not present

## 2022-04-12 DIAGNOSIS — M069 Rheumatoid arthritis, unspecified: Secondary | ICD-10-CM | POA: Diagnosis not present

## 2022-04-12 DIAGNOSIS — R627 Adult failure to thrive: Secondary | ICD-10-CM | POA: Diagnosis not present

## 2022-04-12 DIAGNOSIS — E46 Unspecified protein-calorie malnutrition: Secondary | ICD-10-CM | POA: Diagnosis not present

## 2022-04-12 DIAGNOSIS — L03115 Cellulitis of right lower limb: Secondary | ICD-10-CM | POA: Diagnosis not present

## 2022-04-12 DIAGNOSIS — M84350D Stress fracture, pelvis, subsequent encounter for fracture with routine healing: Secondary | ICD-10-CM | POA: Diagnosis not present

## 2022-04-12 DIAGNOSIS — G9341 Metabolic encephalopathy: Secondary | ICD-10-CM | POA: Diagnosis not present

## 2022-04-12 DIAGNOSIS — K219 Gastro-esophageal reflux disease without esophagitis: Secondary | ICD-10-CM | POA: Diagnosis not present

## 2022-04-12 DIAGNOSIS — R69 Illness, unspecified: Secondary | ICD-10-CM | POA: Diagnosis not present

## 2022-04-12 DIAGNOSIS — F17200 Nicotine dependence, unspecified, uncomplicated: Secondary | ICD-10-CM | POA: Diagnosis not present

## 2022-04-12 DIAGNOSIS — R634 Abnormal weight loss: Secondary | ICD-10-CM | POA: Diagnosis not present

## 2022-04-12 DIAGNOSIS — Z7951 Long term (current) use of inhaled steroids: Secondary | ICD-10-CM | POA: Diagnosis not present

## 2022-04-12 DIAGNOSIS — W19XXXD Unspecified fall, subsequent encounter: Secondary | ICD-10-CM | POA: Diagnosis not present

## 2022-04-12 DIAGNOSIS — E876 Hypokalemia: Secondary | ICD-10-CM | POA: Diagnosis not present

## 2022-04-15 DIAGNOSIS — L03116 Cellulitis of left lower limb: Secondary | ICD-10-CM | POA: Diagnosis not present

## 2022-04-15 DIAGNOSIS — F17200 Nicotine dependence, unspecified, uncomplicated: Secondary | ICD-10-CM | POA: Diagnosis not present

## 2022-04-15 DIAGNOSIS — J439 Emphysema, unspecified: Secondary | ICD-10-CM | POA: Diagnosis not present

## 2022-04-15 DIAGNOSIS — E46 Unspecified protein-calorie malnutrition: Secondary | ICD-10-CM | POA: Diagnosis not present

## 2022-04-15 DIAGNOSIS — M8438XD Stress fracture, other site, subsequent encounter for fracture with routine healing: Secondary | ICD-10-CM | POA: Diagnosis not present

## 2022-04-15 DIAGNOSIS — E876 Hypokalemia: Secondary | ICD-10-CM | POA: Diagnosis not present

## 2022-04-15 DIAGNOSIS — J690 Pneumonitis due to inhalation of food and vomit: Secondary | ICD-10-CM | POA: Diagnosis not present

## 2022-04-15 DIAGNOSIS — M069 Rheumatoid arthritis, unspecified: Secondary | ICD-10-CM | POA: Diagnosis not present

## 2022-04-15 DIAGNOSIS — Z7951 Long term (current) use of inhaled steroids: Secondary | ICD-10-CM | POA: Diagnosis not present

## 2022-04-15 DIAGNOSIS — R627 Adult failure to thrive: Secondary | ICD-10-CM | POA: Diagnosis not present

## 2022-04-15 DIAGNOSIS — W19XXXD Unspecified fall, subsequent encounter: Secondary | ICD-10-CM | POA: Diagnosis not present

## 2022-04-15 DIAGNOSIS — G9341 Metabolic encephalopathy: Secondary | ICD-10-CM | POA: Diagnosis not present

## 2022-04-15 DIAGNOSIS — F1090 Alcohol use, unspecified, uncomplicated: Secondary | ICD-10-CM | POA: Diagnosis not present

## 2022-04-15 DIAGNOSIS — R69 Illness, unspecified: Secondary | ICD-10-CM | POA: Diagnosis not present

## 2022-04-15 DIAGNOSIS — E162 Hypoglycemia, unspecified: Secondary | ICD-10-CM | POA: Diagnosis not present

## 2022-04-15 DIAGNOSIS — R634 Abnormal weight loss: Secondary | ICD-10-CM | POA: Diagnosis not present

## 2022-04-15 DIAGNOSIS — M84350D Stress fracture, pelvis, subsequent encounter for fracture with routine healing: Secondary | ICD-10-CM | POA: Diagnosis not present

## 2022-04-15 DIAGNOSIS — K219 Gastro-esophageal reflux disease without esophagitis: Secondary | ICD-10-CM | POA: Diagnosis not present

## 2022-04-15 DIAGNOSIS — L03115 Cellulitis of right lower limb: Secondary | ICD-10-CM | POA: Diagnosis not present

## 2022-04-19 DIAGNOSIS — Z8701 Personal history of pneumonia (recurrent): Secondary | ICD-10-CM | POA: Diagnosis not present

## 2022-04-19 DIAGNOSIS — Z87891 Personal history of nicotine dependence: Secondary | ICD-10-CM | POA: Diagnosis not present

## 2022-04-19 DIAGNOSIS — M0600 Rheumatoid arthritis without rheumatoid factor, unspecified site: Secondary | ICD-10-CM | POA: Diagnosis not present

## 2022-04-19 DIAGNOSIS — F17210 Nicotine dependence, cigarettes, uncomplicated: Secondary | ICD-10-CM | POA: Diagnosis not present

## 2022-04-19 DIAGNOSIS — E46 Unspecified protein-calorie malnutrition: Secondary | ICD-10-CM | POA: Diagnosis not present

## 2022-04-19 DIAGNOSIS — K7290 Hepatic failure, unspecified without coma: Secondary | ICD-10-CM | POA: Diagnosis not present

## 2022-04-19 DIAGNOSIS — L03115 Cellulitis of right lower limb: Secondary | ICD-10-CM | POA: Diagnosis not present

## 2022-04-19 DIAGNOSIS — M8448XA Pathological fracture, other site, initial encounter for fracture: Secondary | ICD-10-CM | POA: Diagnosis not present

## 2022-04-19 DIAGNOSIS — M810 Age-related osteoporosis without current pathological fracture: Secondary | ICD-10-CM | POA: Diagnosis not present

## 2022-04-19 DIAGNOSIS — J449 Chronic obstructive pulmonary disease, unspecified: Secondary | ICD-10-CM | POA: Diagnosis not present

## 2022-04-19 DIAGNOSIS — F1090 Alcohol use, unspecified, uncomplicated: Secondary | ICD-10-CM | POA: Diagnosis not present

## 2022-04-19 DIAGNOSIS — N179 Acute kidney failure, unspecified: Secondary | ICD-10-CM | POA: Diagnosis not present

## 2022-04-19 DIAGNOSIS — K219 Gastro-esophageal reflux disease without esophagitis: Secondary | ICD-10-CM | POA: Diagnosis not present

## 2022-04-19 DIAGNOSIS — L03116 Cellulitis of left lower limb: Secondary | ICD-10-CM | POA: Diagnosis not present

## 2022-04-19 DIAGNOSIS — R69 Illness, unspecified: Secondary | ICD-10-CM | POA: Diagnosis not present

## 2022-04-19 DIAGNOSIS — Z9089 Acquired absence of other organs: Secondary | ICD-10-CM | POA: Diagnosis not present

## 2022-04-19 DIAGNOSIS — Z9181 History of falling: Secondary | ICD-10-CM | POA: Diagnosis not present

## 2022-04-19 DIAGNOSIS — J690 Pneumonitis due to inhalation of food and vomit: Secondary | ICD-10-CM | POA: Diagnosis not present

## 2022-04-19 DIAGNOSIS — F32A Depression, unspecified: Secondary | ICD-10-CM | POA: Diagnosis not present

## 2022-04-20 DIAGNOSIS — L03116 Cellulitis of left lower limb: Secondary | ICD-10-CM | POA: Diagnosis not present

## 2022-04-20 DIAGNOSIS — M0600 Rheumatoid arthritis without rheumatoid factor, unspecified site: Secondary | ICD-10-CM | POA: Diagnosis not present

## 2022-04-20 DIAGNOSIS — F1090 Alcohol use, unspecified, uncomplicated: Secondary | ICD-10-CM | POA: Diagnosis not present

## 2022-04-20 DIAGNOSIS — Z8701 Personal history of pneumonia (recurrent): Secondary | ICD-10-CM | POA: Diagnosis not present

## 2022-04-20 DIAGNOSIS — E46 Unspecified protein-calorie malnutrition: Secondary | ICD-10-CM | POA: Diagnosis not present

## 2022-04-20 DIAGNOSIS — M810 Age-related osteoporosis without current pathological fracture: Secondary | ICD-10-CM | POA: Diagnosis not present

## 2022-04-20 DIAGNOSIS — J449 Chronic obstructive pulmonary disease, unspecified: Secondary | ICD-10-CM | POA: Diagnosis not present

## 2022-04-20 DIAGNOSIS — K219 Gastro-esophageal reflux disease without esophagitis: Secondary | ICD-10-CM | POA: Diagnosis not present

## 2022-04-20 DIAGNOSIS — J690 Pneumonitis due to inhalation of food and vomit: Secondary | ICD-10-CM | POA: Diagnosis not present

## 2022-04-20 DIAGNOSIS — F17210 Nicotine dependence, cigarettes, uncomplicated: Secondary | ICD-10-CM | POA: Diagnosis not present

## 2022-04-20 DIAGNOSIS — K7290 Hepatic failure, unspecified without coma: Secondary | ICD-10-CM | POA: Diagnosis not present

## 2022-04-20 DIAGNOSIS — R69 Illness, unspecified: Secondary | ICD-10-CM | POA: Diagnosis not present

## 2022-04-20 DIAGNOSIS — Z9089 Acquired absence of other organs: Secondary | ICD-10-CM | POA: Diagnosis not present

## 2022-04-20 DIAGNOSIS — F32A Depression, unspecified: Secondary | ICD-10-CM | POA: Diagnosis not present

## 2022-04-20 DIAGNOSIS — N179 Acute kidney failure, unspecified: Secondary | ICD-10-CM | POA: Diagnosis not present

## 2022-04-20 DIAGNOSIS — L03115 Cellulitis of right lower limb: Secondary | ICD-10-CM | POA: Diagnosis not present

## 2022-04-20 DIAGNOSIS — Z87891 Personal history of nicotine dependence: Secondary | ICD-10-CM | POA: Diagnosis not present

## 2022-04-20 DIAGNOSIS — Z9181 History of falling: Secondary | ICD-10-CM | POA: Diagnosis not present

## 2022-04-26 DIAGNOSIS — F17210 Nicotine dependence, cigarettes, uncomplicated: Secondary | ICD-10-CM | POA: Diagnosis not present

## 2022-04-26 DIAGNOSIS — R7309 Other abnormal glucose: Secondary | ICD-10-CM | POA: Diagnosis not present

## 2022-04-26 DIAGNOSIS — J449 Chronic obstructive pulmonary disease, unspecified: Secondary | ICD-10-CM | POA: Diagnosis not present

## 2022-04-26 DIAGNOSIS — Z681 Body mass index (BMI) 19 or less, adult: Secondary | ICD-10-CM | POA: Diagnosis not present

## 2022-04-26 DIAGNOSIS — Z299 Encounter for prophylactic measures, unspecified: Secondary | ICD-10-CM | POA: Diagnosis not present

## 2022-04-26 DIAGNOSIS — R69 Illness, unspecified: Secondary | ICD-10-CM | POA: Diagnosis not present

## 2022-04-26 DIAGNOSIS — F32A Depression, unspecified: Secondary | ICD-10-CM | POA: Diagnosis not present

## 2022-04-28 DIAGNOSIS — K219 Gastro-esophageal reflux disease without esophagitis: Secondary | ICD-10-CM | POA: Diagnosis not present

## 2022-04-28 DIAGNOSIS — R69 Illness, unspecified: Secondary | ICD-10-CM | POA: Diagnosis not present

## 2022-04-28 DIAGNOSIS — Z8701 Personal history of pneumonia (recurrent): Secondary | ICD-10-CM | POA: Diagnosis not present

## 2022-04-28 DIAGNOSIS — F1090 Alcohol use, unspecified, uncomplicated: Secondary | ICD-10-CM | POA: Diagnosis not present

## 2022-04-28 DIAGNOSIS — L03115 Cellulitis of right lower limb: Secondary | ICD-10-CM | POA: Diagnosis not present

## 2022-04-28 DIAGNOSIS — M0600 Rheumatoid arthritis without rheumatoid factor, unspecified site: Secondary | ICD-10-CM | POA: Diagnosis not present

## 2022-04-28 DIAGNOSIS — F17210 Nicotine dependence, cigarettes, uncomplicated: Secondary | ICD-10-CM | POA: Diagnosis not present

## 2022-04-28 DIAGNOSIS — J449 Chronic obstructive pulmonary disease, unspecified: Secondary | ICD-10-CM | POA: Diagnosis not present

## 2022-04-28 DIAGNOSIS — Z9181 History of falling: Secondary | ICD-10-CM | POA: Diagnosis not present

## 2022-04-28 DIAGNOSIS — J690 Pneumonitis due to inhalation of food and vomit: Secondary | ICD-10-CM | POA: Diagnosis not present

## 2022-04-28 DIAGNOSIS — Z87891 Personal history of nicotine dependence: Secondary | ICD-10-CM | POA: Diagnosis not present

## 2022-04-28 DIAGNOSIS — K7290 Hepatic failure, unspecified without coma: Secondary | ICD-10-CM | POA: Diagnosis not present

## 2022-04-28 DIAGNOSIS — F32A Depression, unspecified: Secondary | ICD-10-CM | POA: Diagnosis not present

## 2022-04-28 DIAGNOSIS — N179 Acute kidney failure, unspecified: Secondary | ICD-10-CM | POA: Diagnosis not present

## 2022-04-28 DIAGNOSIS — Z9089 Acquired absence of other organs: Secondary | ICD-10-CM | POA: Diagnosis not present

## 2022-04-28 DIAGNOSIS — L03116 Cellulitis of left lower limb: Secondary | ICD-10-CM | POA: Diagnosis not present

## 2022-04-28 DIAGNOSIS — M810 Age-related osteoporosis without current pathological fracture: Secondary | ICD-10-CM | POA: Diagnosis not present

## 2022-04-28 DIAGNOSIS — E46 Unspecified protein-calorie malnutrition: Secondary | ICD-10-CM | POA: Diagnosis not present

## 2022-04-29 DIAGNOSIS — Z87891 Personal history of nicotine dependence: Secondary | ICD-10-CM | POA: Diagnosis not present

## 2022-04-29 DIAGNOSIS — F32A Depression, unspecified: Secondary | ICD-10-CM | POA: Diagnosis not present

## 2022-04-29 DIAGNOSIS — M0600 Rheumatoid arthritis without rheumatoid factor, unspecified site: Secondary | ICD-10-CM | POA: Diagnosis not present

## 2022-04-29 DIAGNOSIS — Z9181 History of falling: Secondary | ICD-10-CM | POA: Diagnosis not present

## 2022-04-29 DIAGNOSIS — M810 Age-related osteoporosis without current pathological fracture: Secondary | ICD-10-CM | POA: Diagnosis not present

## 2022-04-29 DIAGNOSIS — Z9089 Acquired absence of other organs: Secondary | ICD-10-CM | POA: Diagnosis not present

## 2022-04-29 DIAGNOSIS — F17210 Nicotine dependence, cigarettes, uncomplicated: Secondary | ICD-10-CM | POA: Diagnosis not present

## 2022-04-29 DIAGNOSIS — K7290 Hepatic failure, unspecified without coma: Secondary | ICD-10-CM | POA: Diagnosis not present

## 2022-04-29 DIAGNOSIS — K219 Gastro-esophageal reflux disease without esophagitis: Secondary | ICD-10-CM | POA: Diagnosis not present

## 2022-04-29 DIAGNOSIS — E46 Unspecified protein-calorie malnutrition: Secondary | ICD-10-CM | POA: Diagnosis not present

## 2022-04-29 DIAGNOSIS — F1090 Alcohol use, unspecified, uncomplicated: Secondary | ICD-10-CM | POA: Diagnosis not present

## 2022-04-29 DIAGNOSIS — R69 Illness, unspecified: Secondary | ICD-10-CM | POA: Diagnosis not present

## 2022-04-29 DIAGNOSIS — J690 Pneumonitis due to inhalation of food and vomit: Secondary | ICD-10-CM | POA: Diagnosis not present

## 2022-04-29 DIAGNOSIS — N179 Acute kidney failure, unspecified: Secondary | ICD-10-CM | POA: Diagnosis not present

## 2022-04-29 DIAGNOSIS — L03116 Cellulitis of left lower limb: Secondary | ICD-10-CM | POA: Diagnosis not present

## 2022-04-29 DIAGNOSIS — Z8701 Personal history of pneumonia (recurrent): Secondary | ICD-10-CM | POA: Diagnosis not present

## 2022-04-29 DIAGNOSIS — L03115 Cellulitis of right lower limb: Secondary | ICD-10-CM | POA: Diagnosis not present

## 2022-04-29 DIAGNOSIS — J449 Chronic obstructive pulmonary disease, unspecified: Secondary | ICD-10-CM | POA: Diagnosis not present

## 2022-04-30 DIAGNOSIS — F1090 Alcohol use, unspecified, uncomplicated: Secondary | ICD-10-CM | POA: Diagnosis not present

## 2022-04-30 DIAGNOSIS — Z8701 Personal history of pneumonia (recurrent): Secondary | ICD-10-CM | POA: Diagnosis not present

## 2022-04-30 DIAGNOSIS — K219 Gastro-esophageal reflux disease without esophagitis: Secondary | ICD-10-CM | POA: Diagnosis not present

## 2022-04-30 DIAGNOSIS — J449 Chronic obstructive pulmonary disease, unspecified: Secondary | ICD-10-CM | POA: Diagnosis not present

## 2022-04-30 DIAGNOSIS — L03116 Cellulitis of left lower limb: Secondary | ICD-10-CM | POA: Diagnosis not present

## 2022-04-30 DIAGNOSIS — Z9181 History of falling: Secondary | ICD-10-CM | POA: Diagnosis not present

## 2022-04-30 DIAGNOSIS — F32A Depression, unspecified: Secondary | ICD-10-CM | POA: Diagnosis not present

## 2022-04-30 DIAGNOSIS — E46 Unspecified protein-calorie malnutrition: Secondary | ICD-10-CM | POA: Diagnosis not present

## 2022-04-30 DIAGNOSIS — Z9089 Acquired absence of other organs: Secondary | ICD-10-CM | POA: Diagnosis not present

## 2022-04-30 DIAGNOSIS — L03115 Cellulitis of right lower limb: Secondary | ICD-10-CM | POA: Diagnosis not present

## 2022-04-30 DIAGNOSIS — R69 Illness, unspecified: Secondary | ICD-10-CM | POA: Diagnosis not present

## 2022-04-30 DIAGNOSIS — K7290 Hepatic failure, unspecified without coma: Secondary | ICD-10-CM | POA: Diagnosis not present

## 2022-04-30 DIAGNOSIS — J690 Pneumonitis due to inhalation of food and vomit: Secondary | ICD-10-CM | POA: Diagnosis not present

## 2022-04-30 DIAGNOSIS — F17210 Nicotine dependence, cigarettes, uncomplicated: Secondary | ICD-10-CM | POA: Diagnosis not present

## 2022-04-30 DIAGNOSIS — Z87891 Personal history of nicotine dependence: Secondary | ICD-10-CM | POA: Diagnosis not present

## 2022-04-30 DIAGNOSIS — M0600 Rheumatoid arthritis without rheumatoid factor, unspecified site: Secondary | ICD-10-CM | POA: Diagnosis not present

## 2022-04-30 DIAGNOSIS — N179 Acute kidney failure, unspecified: Secondary | ICD-10-CM | POA: Diagnosis not present

## 2022-04-30 DIAGNOSIS — M810 Age-related osteoporosis without current pathological fracture: Secondary | ICD-10-CM | POA: Diagnosis not present

## 2022-05-04 DIAGNOSIS — E46 Unspecified protein-calorie malnutrition: Secondary | ICD-10-CM | POA: Diagnosis not present

## 2022-05-04 DIAGNOSIS — R69 Illness, unspecified: Secondary | ICD-10-CM | POA: Diagnosis not present

## 2022-05-04 DIAGNOSIS — L03115 Cellulitis of right lower limb: Secondary | ICD-10-CM | POA: Diagnosis not present

## 2022-05-04 DIAGNOSIS — L03116 Cellulitis of left lower limb: Secondary | ICD-10-CM | POA: Diagnosis not present

## 2022-05-13 DIAGNOSIS — F1090 Alcohol use, unspecified, uncomplicated: Secondary | ICD-10-CM | POA: Diagnosis not present

## 2022-05-13 DIAGNOSIS — Z9181 History of falling: Secondary | ICD-10-CM | POA: Diagnosis not present

## 2022-05-13 DIAGNOSIS — Z8701 Personal history of pneumonia (recurrent): Secondary | ICD-10-CM | POA: Diagnosis not present

## 2022-05-13 DIAGNOSIS — K7290 Hepatic failure, unspecified without coma: Secondary | ICD-10-CM | POA: Diagnosis not present

## 2022-05-13 DIAGNOSIS — M0600 Rheumatoid arthritis without rheumatoid factor, unspecified site: Secondary | ICD-10-CM | POA: Diagnosis not present

## 2022-05-13 DIAGNOSIS — R69 Illness, unspecified: Secondary | ICD-10-CM | POA: Diagnosis not present

## 2022-05-13 DIAGNOSIS — Z87891 Personal history of nicotine dependence: Secondary | ICD-10-CM | POA: Diagnosis not present

## 2022-05-13 DIAGNOSIS — L03116 Cellulitis of left lower limb: Secondary | ICD-10-CM | POA: Diagnosis not present

## 2022-05-13 DIAGNOSIS — F32A Depression, unspecified: Secondary | ICD-10-CM | POA: Diagnosis not present

## 2022-05-13 DIAGNOSIS — M810 Age-related osteoporosis without current pathological fracture: Secondary | ICD-10-CM | POA: Diagnosis not present

## 2022-05-13 DIAGNOSIS — Z9089 Acquired absence of other organs: Secondary | ICD-10-CM | POA: Diagnosis not present

## 2022-05-13 DIAGNOSIS — F17210 Nicotine dependence, cigarettes, uncomplicated: Secondary | ICD-10-CM | POA: Diagnosis not present

## 2022-05-13 DIAGNOSIS — K219 Gastro-esophageal reflux disease without esophagitis: Secondary | ICD-10-CM | POA: Diagnosis not present

## 2022-05-13 DIAGNOSIS — J449 Chronic obstructive pulmonary disease, unspecified: Secondary | ICD-10-CM | POA: Diagnosis not present

## 2022-05-13 DIAGNOSIS — E46 Unspecified protein-calorie malnutrition: Secondary | ICD-10-CM | POA: Diagnosis not present

## 2022-05-13 DIAGNOSIS — N179 Acute kidney failure, unspecified: Secondary | ICD-10-CM | POA: Diagnosis not present

## 2022-05-13 DIAGNOSIS — J690 Pneumonitis due to inhalation of food and vomit: Secondary | ICD-10-CM | POA: Diagnosis not present

## 2022-05-13 DIAGNOSIS — L03115 Cellulitis of right lower limb: Secondary | ICD-10-CM | POA: Diagnosis not present

## 2022-05-27 DIAGNOSIS — Z7189 Other specified counseling: Secondary | ICD-10-CM | POA: Diagnosis not present

## 2022-05-27 DIAGNOSIS — Z713 Dietary counseling and surveillance: Secondary | ICD-10-CM | POA: Diagnosis not present

## 2022-05-27 DIAGNOSIS — Z0000 Encounter for general adult medical examination without abnormal findings: Secondary | ICD-10-CM | POA: Diagnosis not present

## 2022-05-27 DIAGNOSIS — R69 Illness, unspecified: Secondary | ICD-10-CM | POA: Diagnosis not present

## 2022-05-27 DIAGNOSIS — R5383 Other fatigue: Secondary | ICD-10-CM | POA: Diagnosis not present

## 2022-05-27 DIAGNOSIS — Z681 Body mass index (BMI) 19 or less, adult: Secondary | ICD-10-CM | POA: Diagnosis not present

## 2022-05-27 DIAGNOSIS — Z299 Encounter for prophylactic measures, unspecified: Secondary | ICD-10-CM | POA: Diagnosis not present

## 2022-05-27 DIAGNOSIS — E7800 Pure hypercholesterolemia, unspecified: Secondary | ICD-10-CM | POA: Diagnosis not present

## 2022-05-27 DIAGNOSIS — Z1339 Encounter for screening examination for other mental health and behavioral disorders: Secondary | ICD-10-CM | POA: Diagnosis not present

## 2022-05-27 DIAGNOSIS — F17210 Nicotine dependence, cigarettes, uncomplicated: Secondary | ICD-10-CM | POA: Diagnosis not present

## 2022-05-27 DIAGNOSIS — Z1331 Encounter for screening for depression: Secondary | ICD-10-CM | POA: Diagnosis not present

## 2022-05-27 DIAGNOSIS — Z79899 Other long term (current) drug therapy: Secondary | ICD-10-CM | POA: Diagnosis not present

## 2022-08-05 DIAGNOSIS — R69 Illness, unspecified: Secondary | ICD-10-CM | POA: Diagnosis not present

## 2022-08-05 DIAGNOSIS — J449 Chronic obstructive pulmonary disease, unspecified: Secondary | ICD-10-CM | POA: Diagnosis not present

## 2022-08-27 DIAGNOSIS — E538 Deficiency of other specified B group vitamins: Secondary | ICD-10-CM | POA: Diagnosis not present

## 2022-08-27 DIAGNOSIS — J449 Chronic obstructive pulmonary disease, unspecified: Secondary | ICD-10-CM | POA: Diagnosis not present

## 2022-08-27 DIAGNOSIS — Z299 Encounter for prophylactic measures, unspecified: Secondary | ICD-10-CM | POA: Diagnosis not present

## 2022-08-27 DIAGNOSIS — R69 Illness, unspecified: Secondary | ICD-10-CM | POA: Diagnosis not present

## 2022-08-27 DIAGNOSIS — Z681 Body mass index (BMI) 19 or less, adult: Secondary | ICD-10-CM | POA: Diagnosis not present

## 2022-08-27 DIAGNOSIS — F17210 Nicotine dependence, cigarettes, uncomplicated: Secondary | ICD-10-CM | POA: Diagnosis not present

## 2022-08-27 DIAGNOSIS — M549 Dorsalgia, unspecified: Secondary | ICD-10-CM | POA: Diagnosis not present

## 2022-08-31 DIAGNOSIS — J449 Chronic obstructive pulmonary disease, unspecified: Secondary | ICD-10-CM | POA: Diagnosis not present

## 2022-08-31 DIAGNOSIS — E538 Deficiency of other specified B group vitamins: Secondary | ICD-10-CM | POA: Diagnosis not present

## 2022-09-21 ENCOUNTER — Other Ambulatory Visit: Payer: Self-pay

## 2022-09-21 ENCOUNTER — Encounter (HOSPITAL_COMMUNITY): Payer: Self-pay

## 2022-09-21 DIAGNOSIS — F17210 Nicotine dependence, cigarettes, uncomplicated: Secondary | ICD-10-CM | POA: Insufficient documentation

## 2022-09-21 DIAGNOSIS — R911 Solitary pulmonary nodule: Secondary | ICD-10-CM | POA: Diagnosis not present

## 2022-09-21 DIAGNOSIS — R627 Adult failure to thrive: Secondary | ICD-10-CM | POA: Diagnosis not present

## 2022-09-21 DIAGNOSIS — R531 Weakness: Secondary | ICD-10-CM | POA: Diagnosis not present

## 2022-09-21 DIAGNOSIS — J441 Chronic obstructive pulmonary disease with (acute) exacerbation: Secondary | ICD-10-CM | POA: Insufficient documentation

## 2022-09-21 DIAGNOSIS — R0600 Dyspnea, unspecified: Secondary | ICD-10-CM | POA: Diagnosis not present

## 2022-09-21 DIAGNOSIS — I10 Essential (primary) hypertension: Secondary | ICD-10-CM | POA: Diagnosis not present

## 2022-09-21 DIAGNOSIS — Z96643 Presence of artificial hip joint, bilateral: Secondary | ICD-10-CM | POA: Diagnosis not present

## 2022-09-21 DIAGNOSIS — J449 Chronic obstructive pulmonary disease, unspecified: Secondary | ICD-10-CM | POA: Diagnosis not present

## 2022-09-21 DIAGNOSIS — R410 Disorientation, unspecified: Secondary | ICD-10-CM | POA: Diagnosis not present

## 2022-09-21 DIAGNOSIS — K5730 Diverticulosis of large intestine without perforation or abscess without bleeding: Secondary | ICD-10-CM | POA: Diagnosis not present

## 2022-09-21 DIAGNOSIS — R69 Illness, unspecified: Secondary | ICD-10-CM | POA: Diagnosis not present

## 2022-09-21 LAB — COMPREHENSIVE METABOLIC PANEL
ALT: 16 U/L (ref 0–44)
AST: 22 U/L (ref 15–41)
Albumin: 3.5 g/dL (ref 3.5–5.0)
Alkaline Phosphatase: 74 U/L (ref 38–126)
Anion gap: 10 (ref 5–15)
BUN: 19 mg/dL (ref 8–23)
CO2: 21 mmol/L — ABNORMAL LOW (ref 22–32)
Calcium: 8.6 mg/dL — ABNORMAL LOW (ref 8.9–10.3)
Chloride: 107 mmol/L (ref 98–111)
Creatinine, Ser: 0.64 mg/dL (ref 0.44–1.00)
GFR, Estimated: >60 mL/min (ref >60)
Glucose, Bld: 99 mg/dL (ref 70–99)
Potassium: 4.3 mmol/L (ref 3.5–5.1)
Sodium: 138 mmol/L (ref 135–145)
Total Bilirubin: 0.3 mg/dL (ref 0.3–1.2)
Total Protein: 7.4 g/dL (ref 6.5–8.1)

## 2022-09-21 LAB — CBC
HCT: 35.7 % — ABNORMAL LOW (ref 36.0–46.0)
Hemoglobin: 11 g/dL — ABNORMAL LOW (ref 12.0–15.0)
MCH: 27 pg (ref 26.0–34.0)
MCHC: 30.8 g/dL (ref 30.0–36.0)
MCV: 87.7 fL (ref 80.0–100.0)
Platelets: 277 10*3/uL (ref 150–400)
RBC: 4.07 MIL/uL (ref 3.87–5.11)
RDW: 19.9 % — ABNORMAL HIGH (ref 11.5–15.5)
WBC: 5.7 10*3/uL (ref 4.0–10.5)
nRBC: 0 % (ref 0.0–0.2)

## 2022-09-21 LAB — LIPASE, BLOOD: Lipase: 21 U/L (ref 11–51)

## 2022-09-21 NOTE — ED Triage Notes (Signed)
Pt reports weakness. Pt not able to eat due to nv. Hx of throat stretching. Over the last week patient more confused. Less sleeping recently

## 2022-09-22 ENCOUNTER — Emergency Department (HOSPITAL_COMMUNITY): Payer: Medicare HMO

## 2022-09-22 ENCOUNTER — Emergency Department (HOSPITAL_COMMUNITY)
Admission: EM | Admit: 2022-09-22 | Discharge: 2022-09-22 | Disposition: A | Discharge status: Home / Self Care | Payer: Medicare HMO | Attending: Emergency Medicine | Admitting: Emergency Medicine

## 2022-09-22 DIAGNOSIS — K5730 Diverticulosis of large intestine without perforation or abscess without bleeding: Secondary | ICD-10-CM | POA: Diagnosis not present

## 2022-09-22 DIAGNOSIS — R911 Solitary pulmonary nodule: Secondary | ICD-10-CM | POA: Diagnosis not present

## 2022-09-22 DIAGNOSIS — R0600 Dyspnea, unspecified: Secondary | ICD-10-CM | POA: Diagnosis not present

## 2022-09-22 DIAGNOSIS — J449 Chronic obstructive pulmonary disease, unspecified: Secondary | ICD-10-CM | POA: Diagnosis not present

## 2022-09-22 DIAGNOSIS — R627 Adult failure to thrive: Secondary | ICD-10-CM

## 2022-09-22 DIAGNOSIS — R410 Disorientation, unspecified: Secondary | ICD-10-CM | POA: Diagnosis not present

## 2022-09-22 LAB — URINALYSIS, ROUTINE W REFLEX MICROSCOPIC
Bilirubin Urine: NEGATIVE
Glucose, UA: NEGATIVE mg/dL
Hgb urine dipstick: NEGATIVE
Ketones, ur: 5 mg/dL — AB
Leukocytes,Ua: NEGATIVE
Nitrite: NEGATIVE
Protein, ur: NEGATIVE mg/dL
Specific Gravity, Urine: 1.013 (ref 1.005–1.030)
pH: 5 (ref 5.0–8.0)

## 2022-09-22 LAB — BLOOD GAS, VENOUS
Acid-base deficit: 1.6 mmol/L (ref 0.0–2.0)
Bicarbonate: 22.9 mmol/L (ref 20.0–28.0)
Drawn by: 53361
FIO2: 21 %
O2 Saturation: 77.6 %
Patient temperature: 36.4
pCO2, Ven: 36 mmHg — ABNORMAL LOW (ref 44–60)
pH, Ven: 7.41 (ref 7.25–7.43)
pO2, Ven: 44 mmHg (ref 32–45)

## 2022-09-22 LAB — CBG MONITORING, ED: Glucose-Capillary: 72 mg/dL (ref 70–99)

## 2022-09-22 NOTE — Evaluation (Signed)
Physical Therapy Evaluation Patient Details Name: Kristine Roberts MRN: [id number] DOB: [birth year] Today's Date: 09/22/2022  History of Present Illness  Kristine Roberts is a 66 y.o. year-old female with a history of COPD presenting to the ED with chief complaint of weakness.     Progressive weakness, trouble swallowing solid food over the past several weeks.  Unintentional weight loss over the past few years.  Shortness of breath as well, trouble getting around the house, cannot get to the bathroom without help.  No chest pain, no abdominal pain, no fever.   Clinical Impression  Patient functioning near baseline for functional mobility and gait other than requiring use of RW for gait training due to having to lean on nearby objects for support when taking steps without AD.  Patient demonstrates good return for ambulating in room/hallways using RW, transferring to/from commode in bathroom, standing at sink and washing hands without use of an AD with no loss of balance.  Patient c/o difficulty swallowing which appears to be source of weakness and may benefit from speech therapy (SLP) consult - RN notified.  Plan:  Patient discharged from physical therapy to care of nursing for ambulation daily as tolerated for length of stay.         Recommendations for follow up therapy are one component of a multi-disciplinary discharge planning process, led by the attending physician.  Recommendations may be updated based on patient status, additional functional criteria and insurance authorization.  Follow Up Recommendations Home health PT      Assistance Recommended at Discharge Set up Supervision/Assistance  Patient can return home with the following  A little help with walking and/or transfers;A little help with bathing/dressing/bathroom;Assistance with cooking/housework;Help with stairs or ramp for entrance    Equipment Recommendations None recommended by PT  Recommendations for Other Services  Speech  consult    Functional Status Assessment Patient has had a recent decline in their functional status and demonstrates the ability to make significant improvements in function in a reasonable and predictable amount of time.     Precautions / Restrictions Precautions Precautions: Fall Restrictions Weight Bearing Restrictions: No      Mobility  Bed Mobility Overal bed mobility: Modified Independent             General bed mobility comments: slightly labored movement    Transfers Overall transfer level: Modified independent Equipment used: Rolling walker (2 wheels), None               General transfer comment: patient preferred to use RW due to slight unsteadiness    Ambulation/Gait Ambulation/Gait assistance: Modified independent (Device/Increase time) Gait Distance (Feet): 150 Feet Assistive device: Rolling walker (2 wheels) Gait Pattern/deviations: Decreased step length - right, Decreased step length - left, Decreased stride length Gait velocity: slightly decreased     General Gait Details: tendency to lean on nearby objects for support when not using RW, demonstrates good return for ambulating in rom and hallways using RW without loss of balance  Stairs            Wheelchair Mobility    Modified Rankin (Stroke Patients Only)       Balance Overall balance assessment: Needs assistance Sitting-balance support: Feet supported, No upper extremity supported Sitting balance-Leahy Scale: Good Sitting balance - Comments: seated at EOB   Standing balance support: During functional activity, No upper extremity supported Standing balance-Leahy Scale: Fair Standing balance comment: fair/good using RW  Pertinent Vitals/Pain Pain Assessment Pain Assessment: No/denies pain    Home Living Family/patient expects to be discharged to:: Private residence Living Arrangements: Spouse/significant other (boyfriend) Available  Help at Discharge: Family;Available 24 hours/day Type of Home: House Home Access: Stairs to enter Entrance Stairs-Rails: Right;Left;Can reach both Entrance Stairs-Number of Steps: 3   Home Layout: One level Home Equipment: Conservation officer, nature (2 wheels);Cane - quad;BSC/3in1;Shower seat;Grab bars - tub/shower      Prior Function Prior Level of Function : Independent/Modified Independent             Mobility Comments: Community ambulator without AD, does not drive ADLs Comments: Independent household ADLs, assisted for community ADLs     Hand Dominance        Extremity/Trunk Assessment   Upper Extremity Assessment Upper Extremity Assessment: Overall WFL for tasks assessed    Lower Extremity Assessment Lower Extremity Assessment: Generalized weakness    Cervical / Trunk Assessment Cervical / Trunk Assessment: Normal  Communication   Communication: No difficulties  Cognition Arousal/Alertness: Awake/alert Behavior During Therapy: WFL for tasks assessed/performed Overall Cognitive Status: Within Functional Limits for tasks assessed                                          General Comments      Exercises     Assessment/Plan    PT Assessment All further PT needs can be met in the next venue of care  PT Problem List Decreased strength;Decreased activity tolerance;Decreased mobility;Decreased balance       PT Treatment Interventions      PT Goals (Current goals can be found in the Care Plan section)  Acute Rehab PT Goals Patient Stated Goal: return home with boyfriend to assist PT Goal Formulation: With patient Time For Goal Achievement: 09/22/22 Potential to Achieve Goals: Good    Frequency       Co-evaluation               AM-PAC PT "6 Clicks" Mobility  Outcome Measure Help needed turning from your back to your side while in a flat bed without using bedrails?: None Help needed moving from lying on your back to sitting on the side  of a flat bed without using bedrails?: None Help needed moving to and from a bed to a chair (including a wheelchair)?: None Help needed standing up from a chair using your arms (e.g., wheelchair or bedside chair)?: None Help needed to walk in hospital room?: A Little Help needed climbing 3-5 steps with a railing? : A Little 6 Click Score: 22    End of Session   Activity Tolerance: Patient tolerated treatment well;Patient limited by fatigue Patient left: in chair;with call bell/phone within reach Nurse Communication: Mobility status PT Visit Diagnosis: Unsteadiness on feet (R26.81);Other abnormalities of gait and mobility (R26.89);Muscle weakness (generalized) (M62.81)    Time: 1245-8099 PT Time Calculation (min) (ACUTE ONLY): 24 min   Charges:   PT Evaluation $PT Eval Moderate Complexity: 1 Mod PT Treatments $Therapeutic Activity: 23-37 mins        11:28 AM, 09/22/22 Lonell Grandchild, MPT Physical Therapist with [facility] [phone] office [phone] mobile phone

## 2022-09-22 NOTE — Discharge Instructions (Addendum)
Follow up with your family md this week.   Call your GI doctor or Dr. Jenetta Downer to be seen this week for your swallowing problems

## 2022-09-22 NOTE — Progress Notes (Addendum)
Transition of Care [facility]) - Emergency Department Mini Assessment   Patient Details  Name: Kristine Roberts MRN: [id number] Date of Birth: [birth year]  Transition of Care [place]) CM/SW Contact:    Kimber Relic, LCSW Phone Number: 09/22/2022, 11:37 AM   Clinical Narrative: Pt reported to hospital c/o weakness and unable to eat due to N/V.  TOC consulted for [facility] PT. This CSW contacted the pt to inquire about any particular [place] agencies. The pt states "No, I don't need any home health." This CSW confirmed that the pt did not want to be set up with [facility] and the pt responded "No, I don't want it. The physical therapist saw me walk and everything. I've had home health before but I just rather do it myself." The pt did mention that she has been vomiting after eating and this CSW asked the pt if this was shared with the doctor. The pt stated it was and that she was told to see another doctor that she's seen before. This CSW secure chatted Dr. Roderic Palau to inform him of this information. Dr. Roderic Palau shared that the pt is to see a GI doctor. This CSW has contacted the pt back to inform her. The pt states that the doctor is there now speaking with her. No other TOC needs at this time. TOC signing off.    ED Mini Assessment: What brought you to the Emergency Department? : weakness/unable to eat due to N/V  Barriers to Discharge: No Barriers Identified     Means of departure: Not know       Patient Contact and Communications Key Contact 1: Patient   Spoke with: Patient Contact Date: 09/22/22,   Contact time: 7 Contact Phone Number: [phone] Call outcome: Pt declined [place]  Patient states their goals for this hospitalization and ongoing recovery are:: Return home.      Admission diagnosis:  Possible dehydration Patient Active Problem List   Diagnosis Date Noted   Goals of care, counseling/discussion    Altered mental status 02/14/2022   Acute lower UTI 02/14/2022   GERD without  esophagitis 02/14/2022   Depression 02/14/2022   Sinus bradycardia 02/14/2022   Closed fracture of multiple pubic rami, right, initial encounter ([place]) 12/11/2021   Failure to thrive in adult 07/24/2020   Gastritis and gastroduodenitis    Hiatal hernia    H/O [place] I operation    Anastomotic stricture of gastrojejunostomy    Non-intractable vomiting    Community acquired pneumonia of left upper lobe of lung 06/25/2020   Closed left hip fracture, initial encounter ([place]) 06/25/2020   RBBB 06/25/2020   Dehydration 08/22/2019   Nausea 08/20/2019   Abnormal liver function 08/20/2019   Hyponatremia 08/20/2019   Closed right hip fracture ([place]) 07/08/2018   Cellulitis of both lower extremities 07/08/2018   Gastric ulcer 01/11/2018   Hypokalemia 12/26/2017   Cachexia ([place]) 12/26/2017   Hypotension 12/26/2017   Tobacco abuse 12/26/2017   ETOH abuse 12/26/2017   Anemia 12/26/2017   Pressure injury of skin 12/26/2017   Protein-calorie malnutrition, severe 03/05/2016   Gastric outlet obstruction 03/03/2016   COPD (chronic obstructive pulmonary disease) ([place]) 03/03/2016   Dysphagia 02/03/2016   PCP:  Monico Blitz, MD Pharmacy:   [facility] [address] [place] - [address] [place] [address] Phone: [phone] Fax: [phone]

## 2022-09-22 NOTE — ED Provider Notes (Signed)
[facility] Emergency Department Provider Note MRN:  [id number]  Arrival date & time: 09/22/22     Chief Complaint   Weakness History of Present Illness   Kristine Roberts is a 66 y.o. year-old female with a history of COPD presenting to the ED with chief complaint of weakness.  Progressive weakness, trouble swallowing solid food over the past several weeks.  Unintentional weight loss over the past few years.  Shortness of breath as well, trouble getting around the house, cannot get to the bathroom without help.  No chest pain, no abdominal pain, no fever.  Review of Systems  A thorough review of systems was obtained and all systems are negative except as noted in the HPI and PMH.   Patient's Health History    Past Medical History:  Diagnosis Date   COPD (chronic obstructive pulmonary disease) (HCC)    Dysphagia    Dysrhythmia    Esophagitis    Gastric outlet obstruction    Gastric stenosis    GERD (gastroesophageal reflux disease)    Hiatal hernia    Osteoporosis    PUD (peptic ulcer disease)    Wounds, multiple     Past Surgical History:  Procedure Laterality Date   APPENDECTOMY     BALLOON DILATION N/A 03/05/2016   Procedure: BALLOON DILATION;  Surgeon: Rogene Houston, MD;  [facility];  Service: Endoscopy;  Laterality: N/A;  pyloric channel dialtaion   BALLOON DILATION N/A 07/14/2018   Procedure: BALLOON DILATION;  Surgeon: Jackquline Denmark, MD;  Location: [facility] ENDOSCOPY;  Service: Endoscopy;  Laterality: N/A;   BALLOON DILATION  10/03/2020   Procedure: BALLOON DILATION;  Surgeon: Montez Morita, Quillian Quince, MD;  [facility];  Service: Gastroenterology;;   Larrie Kass DILATION  12/19/2020   Procedure: [place];  Surgeon: Montez Morita, Quillian Quince, MD;  [facility];  Service: Gastroenterology;;   BIOPSY  07/14/2018   Procedure: BIOPSY;  Surgeon: Jackquline Denmark, MD;  Location: [facility] ENDOSCOPY;  Service: Endoscopy;;    BIOPSY  07/03/2020   Procedure: BIOPSY;  Surgeon: Lavena Bullion, DO;  Location: [facility] ENDOSCOPY;  Service: Gastroenterology;;   CHOLECYSTECTOMY     COLONOSCOPY WITH PROPOFOL N/A 08/01/2020   Procedure: COLONOSCOPY WITH PROPOFOL;  Surgeon: Harvel Quale, MD;  [facility];  Service: Gastroenterology;  Laterality: N/A;  1130   ESOPHAGEAL DILATION N/A 03/03/2016   Procedure: ESOPHAGEAL DILATION;  Surgeon: Rogene Houston, MD;  [facility];  Service: Endoscopy;  Laterality: N/A;   ESOPHAGEAL DILATION N/A 05/07/2016   Procedure: ESOPHAGEAL DILATION;  Surgeon: Rogene Houston, MD;  [facility];  Service: Endoscopy;  Laterality: N/A;   ESOPHAGEAL DILATION  01/26/2018   Procedure: DILATION OF ANASTOMOTIC  STRICTURE;  Surgeon: Rogene Houston, MD;  [facility];  Service: Endoscopy;;   ESOPHAGEAL DILATION N/A 08/01/2020   Procedure: ESOPHAGEAL DILATION;  Surgeon: Harvel Quale, MD;  [facility];  Service: Gastroenterology;  Laterality: N/A;   ESOPHAGOGASTRODUODENOSCOPY N/A 03/03/2016   Procedure: ESOPHAGOGASTRODUODENOSCOPY (EGD);  Surgeon: Rogene Houston, MD;  [facility];  Service: Endoscopy;  Laterality: N/A;  2:00   ESOPHAGOGASTRODUODENOSCOPY N/A 05/07/2016   Procedure: ESOPHAGOGASTRODUODENOSCOPY (EGD);  Surgeon: Rogene Houston, MD;  [facility];  Service: Endoscopy;  Laterality: N/A;  855 - moved to 6/2 @ 10:15 - Ann notified pt   ESOPHAGOGASTRODUODENOSCOPY N/A 12/28/2017   Procedure: ESOPHAGOGASTRODUODENOSCOPY (EGD) with stricture dilation;  Surgeon: Rogene Houston, MD;  [facility];  Service: Endoscopy;  Laterality: N/A;   ESOPHAGOGASTRODUODENOSCOPY N/A 01/26/2018   Procedure: ESOPHAGOGASTRODUODENOSCOPY (EGD);  Surgeon: Rogene Houston, MD;  [facility];  Service: Endoscopy;  Laterality: N/A;  255   ESOPHAGOGASTRODUODENOSCOPY N/A 07/14/2018   Procedure:  ESOPHAGOGASTRODUODENOSCOPY (EGD);  Surgeon: Jackquline Denmark, MD;  Location: [facility] ENDOSCOPY;  Service: Endoscopy;  Laterality: N/A;   ESOPHAGOGASTRODUODENOSCOPY (EGD) WITH PROPOFOL N/A 03/05/2016   Procedure: ESOPHAGOGASTRODUODENOSCOPY (EGD) WITH PROPOFOL Anastomotic stricture dilation ;  Surgeon: Rogene Houston, MD;  [facility];  Service: Endoscopy;  Laterality: N/A;  to be done in OR under fluoro   ESOPHAGOGASTRODUODENOSCOPY (EGD) WITH PROPOFOL N/A 08/22/2019   Procedure: ESOPHAGOGASTRODUODENOSCOPY (EGD) WITH PROPOFOL With anastomotic stricture dilation.;  Surgeon: Rogene Houston, MD;  [facility];  Service: Endoscopy;  Laterality: N/A;   ESOPHAGOGASTRODUODENOSCOPY (EGD) WITH PROPOFOL N/A 08/24/2019   Procedure: ESOPHAGOGASTRODUODENOSCOPY (EGD) WITH PROPOFOL with stricture dilation;  Surgeon: Rogene Houston, MD;  [facility];  Service: Endoscopy;  Laterality: N/A;   ESOPHAGOGASTRODUODENOSCOPY (EGD) WITH PROPOFOL N/A 07/03/2020   Procedure: ESOPHAGOGASTRODUODENOSCOPY (EGD) WITH PROPOFOL;  Surgeon: Lavena Bullion, DO;  Location: [place];  Service: Gastroenterology;  Laterality: N/A;   ESOPHAGOGASTRODUODENOSCOPY (EGD) WITH PROPOFOL N/A 08/01/2020   Procedure: ESOPHAGOGASTRODUODENOSCOPY (EGD) WITH PROPOFOL;  Surgeon: Harvel Quale, MD;  [facility];  Service: Gastroenterology;  Laterality: N/A;   ESOPHAGOGASTRODUODENOSCOPY (EGD) WITH PROPOFOL N/A 08/29/2020   Procedure: ESOPHAGOGASTRODUODENOSCOPY (EGD) WITH PROPOFOL;  Surgeon: Harvel Quale, MD;  [facility];  Service: Gastroenterology;  Laterality: N/A;  10:00   ESOPHAGOGASTRODUODENOSCOPY (EGD) WITH PROPOFOL N/A 10/03/2020   Procedure: ESOPHAGOGASTRODUODENOSCOPY (EGD) WITH PROPOFOL;  Surgeon: Harvel Quale, MD;  [facility];  Service: Gastroenterology;  Laterality: N/A;  200   ESOPHAGOGASTRODUODENOSCOPY (EGD) WITH PROPOFOL N/A 12/19/2020    Procedure: ESOPHAGOGASTRODUODENOSCOPY (EGD) WITH PROPOFOL;  Surgeon: Harvel Quale, MD;  [facility];  Service: Gastroenterology;  Laterality: N/A;  [place] BODY REMOVAL  07/03/2020   Procedure: FOREIGN BODY REMOVAL;  Surgeon: Lavena Bullion, DO;  Location: [facility] ENDOSCOPY;  Service: Gastroenterology;;   HIP SURGERY Right 09/2020   HIP SURGERY Left 07/2020   INTRAMEDULLARY (IM) NAIL INTERTROCHANTERIC Right 07/09/2018   Procedure: INTRAMEDULLARY (IM) NAIL INTERTROCHANTRIC;  Surgeon: Nicholes Stairs, MD;  Location: [place];  Service: Orthopedics;  Laterality: Right;   INTRAMEDULLARY (IM) NAIL INTERTROCHANTERIC Left 06/27/2020   Procedure: INTRAMEDULLARY (IM) NAIL INTERTROCHANTRIC;  Surgeon: Nicholes Stairs, MD;  Location: [place];  Service: Orthopedics;  Laterality: Left;   KENALOG INJECTION  07/03/2020   Procedure: KENALOG INJECTION;  Surgeon: Lavena Bullion, DO;  Location: [facility];  Service: Gastroenterology;;   STOMACH SURGERY     TOTAL HIP ARTHROPLASTY Bilateral     Family History  Problem Relation Age of Onset   Alzheimer's disease Mother    COPD Mother    Throat cancer Father    Breast cancer Sister     Social History   Socioeconomic History   Marital status: Widowed    Spouse name: Not on file   Number of children: Not on file   Years of education: Not on file   Highest education level: Not on file  Occupational History   Not on file  Tobacco Use   Smoking status: Every Day    Packs/day: 1.50    Years: 20.00    Total  pack years: 30.00    Types: Cigarettes   Smokeless tobacco: Never  Vaping Use   Vaping Use: Never used  Substance and Sexual Activity   Alcohol use: Not Currently    Alcohol/week: 12.0 standard drinks of alcohol    Types: 12 Cans of beer per week    Comment: weekly   Drug use: No   Sexual activity: Yes    Birth control/protection: None  Other Topics Concern   Not on file  Social History Narrative   Not on  file   Social Determinants of Health   Financial Resource Strain: Not on file  Food Insecurity: Not on file  Transportation Needs: Not on file  Physical Activity: Not on file  Stress: Not on file  Social Connections: Not on file  Intimate Partner Violence: Not on file     Physical Exam   Vitals:   09/22/22 0530 09/22/22 0600  BP: (!) 119/59 116/62  Pulse: 61 (!) 48  Resp: (!) 26 (!) 9  Temp:    SpO2: 100% 100%    CONSTITUTIONAL: Chronically ill-appearing, NAD, cachectic NEURO/PSYCH: Somnolent but wakes to voice, oriented x 3, no focal deficits EYES:  eyes equal and reactive ENT/NECK:  no LAD, no JVD CARDIO: Regular rate, well-perfused, normal S1 and S2 PULM: Right-sided wheezing GI/GU:  non-distended, non-tender MSK/SPINE:  No gross deformities, no edema SKIN:  no rash, atraumatic   *Additional and/or pertinent findings included in MDM below  Diagnostic and Interventional Summary    EKG Interpretation  Date/Time:  Wednesday September 22 2022 03:04:01 EDT Ventricular Rate:  55 PR Interval:  139 QRS Duration: 121 QT Interval:  446 QTC Calculation: 427 R Axis:   107 Text Interpretation: Sinus rhythm Right bundle branch block Baseline wander in lead(s) V2 Confirmed by Gerlene Fee [phone]) on 09/22/2022 6:32:14 AM       Labs Reviewed  COMPREHENSIVE METABOLIC PANEL - Abnormal; Notable for the following components:      Result Value   CO2 21 (*)    Calcium 8.6 (*)    All other components within normal limits  CBC - Abnormal; Notable for the following components:   Hemoglobin 11.0 (*)    HCT 35.7 (*)    RDW 19.9 (*)    All other components within normal limits  URINALYSIS, ROUTINE W REFLEX MICROSCOPIC - Abnormal; Notable for the following components:   Ketones, ur 5 (*)    All other components within normal limits  BLOOD GAS, VENOUS - Abnormal; Notable for the following components:   pCO2, Ven 36 (*)    All other components within normal limits  LIPASE,  BLOOD  CBG MONITORING, ED    DG Chest Port 1 View  Final Result    CT HEAD WO CONTRAST (5MM)  Final Result    CT CHEST ABDOMEN PELVIS WO CONTRAST  Final Result      Medications - No data to display   Procedures  /  Critical Care Procedures  ED Course and Medical Decision Making  Initial Impression and Ddx Differential diagnosis includes COPD exacerbation, hypercarbia, electrolyte disturbance, dehydration, esophageal stricture, underlying malignancy  Past medical/surgical history that increases complexity of ED encounter: COPD  Interpretation of Diagnostics I personally reviewed the EKG and my interpretation is as follows: Bundle branch, sinus rhythm, no significant change from prior  Labs are reassuring with no significant blood count or electrolyte disturbance, CT imaging is without significant abnormalities, overall improved lung aeration, no signs of underlying malignancy  Patient  Reassessment and Ultimate Disposition/Management     Patient continues to have reassuring vital signs, wakes easily.  There is no indication for further testing or admission but both patient and patient's husband are very concerned with a discharge home.  She is unable to go to the bathroom without assistance, she is a fall risk.  Will consult physical therapy and TOC for recommendations.  Signed out to default provider.  Patient management required discussion with the following services or consulting groups:  Case Management/Social Work  Complexity of Problems Addressed Acute illness or injury that poses threat of life of bodily function  Additional Data Reviewed and Analyzed Further history obtained from: Further history from spouse/family member  Additional Factors Impacting ED Encounter Risk Consideration of hospitalization  Barth Kirks. Sedonia Small, MD [place] [email or web address]  Final Clinical Impressions(s) / ED Diagnoses     ICD-10-CM    1. Failure to thrive in adult  R62.7       ED Discharge Orders     None        Discharge Instructions Discussed with and Provided to Patient:   Discharge Instructions   None      Maudie Flakes, MD 09/22/22 [phone]

## 2022-09-24 DIAGNOSIS — G4710 Hypersomnia, unspecified: Secondary | ICD-10-CM | POA: Diagnosis not present

## 2022-09-24 DIAGNOSIS — Z299 Encounter for prophylactic measures, unspecified: Secondary | ICD-10-CM | POA: Diagnosis not present

## 2022-09-24 DIAGNOSIS — G4730 Sleep apnea, unspecified: Secondary | ICD-10-CM | POA: Diagnosis not present

## 2022-09-24 DIAGNOSIS — G4700 Insomnia, unspecified: Secondary | ICD-10-CM | POA: Diagnosis not present

## 2022-09-24 DIAGNOSIS — E538 Deficiency of other specified B group vitamins: Secondary | ICD-10-CM | POA: Diagnosis not present

## 2022-09-24 DIAGNOSIS — H6690 Otitis media, unspecified, unspecified ear: Secondary | ICD-10-CM | POA: Diagnosis not present

## 2022-09-24 DIAGNOSIS — Z681 Body mass index (BMI) 19 or less, adult: Secondary | ICD-10-CM | POA: Diagnosis not present

## 2022-09-24 DIAGNOSIS — R69 Illness, unspecified: Secondary | ICD-10-CM | POA: Diagnosis not present

## 2022-09-25 ENCOUNTER — Encounter: Payer: Self-pay | Admitting: Gastroenterology

## 2022-09-25 NOTE — Progress Notes (Unsigned)
Referring Provider: Monico Blitz, MD Primary Care Physician:  Monico Blitz, MD Primary GI Physician: Dr. Jenetta Downer  No chief complaint on file.   HPI:   Kristine Roberts is a 66 y.o. 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   She was admitted in April 2023 to [facility] with aspiration pneumonitis.  During this admission, also noted severe protein calorie malnutrition, failure to thrive, and history of jejunostomy stenosis.  Possible PEG tube placement was discussed with Dr. Rocco Serene who recommended GI referral based on previous findings in the chart, may need balloon dilation.  Recently seen at [facility] emergency department 09/22/2022 for failure to thrive, presenting with weakness.  Also reported trouble swallowing food over the past several weeks, unintentional weight loss over the last few years.  Shortness of breath trouble.  Hemoglobin mildly low at 11.0 which was stable.  No other significant laboratory abnormalities.  CT head with no acute findings.  CT chest abdomen and pelvis without contrast with extensive multivessel coronary artery calcification, COPD, near complete resolution of previous multifocal pulmonary infiltrate, no acute intra-abdominal pathology.  She was discharged home and advised to follow-up with GI.  Patient has been seen by GI since her last EGD in January 2022.  Today:      Wt Readings from Last 10 Encounters:  09/21/22 72 lb (32.7 kg)  02/14/22 74 lb 15.3 oz (34 kg)  12/29/21 75 lb (34 kg)  12/17/20 79 lb (35.8 kg)  09/30/20 78 lb (35.4 kg)  08/29/20 72 lb 1.5 oz (32.7 kg)  08/01/20 72 lb 1.5 oz (32.7 kg)  07/30/20 72 lb (32.7 kg)  07/24/20 72 lb 6.4 oz (32.8 kg)  07/03/20 (!) 65 lb (29.5 kg)      Last EGD  12/19/2020: Grade B esophagitis, stenosed Billroth II gastrojejunostomy with congestion, edema and ulceration s/p dilation.  Recommended repeat EGD in 4-6 weeks for retreatment.  No solid food for 3 days prior, continue Dexilant 30 mg twice daily.  Colonoscopy 08/01/2020: Diverticulosis in sigmoid and descending colon.  Repeat in 10 years.   Past Medical History:  Diagnosis Date   COPD (chronic obstructive pulmonary disease) (HCC)    Dysphagia    Dysrhythmia    Esophagitis    Gastric outlet obstruction    Gastric stenosis    GERD (gastroesophageal reflux disease)    Hiatal hernia    Osteoporosis    PUD (peptic ulcer disease)    Wounds, multiple     Past Surgical History:  Procedure Laterality Date   APPENDECTOMY     BALLOON DILATION N/A 03/05/2016   Procedure: BALLOON DILATION;  Surgeon: Rogene Houston, MD;  [facility];  Service: Endoscopy;  Laterality: N/A;  pyloric channel dialtaion   BALLOON DILATION N/A 07/14/2018   Procedure: BALLOON DILATION;  Surgeon: Jackquline Denmark, MD;  Location: [facility] ENDOSCOPY;  Service: Endoscopy;  Laterality: N/A;   BALLOON DILATION  10/03/2020   Procedure: BALLOON DILATION;  Surgeon: Montez Morita, Quillian Quince, MD;  [facility];  Service: Gastroenterology;;   Larrie Kass DILATION  12/19/2020   Procedure: [place];  Surgeon: Montez Morita, Quillian Quince, MD;  [facility];  Service: Gastroenterology;;   BIOPSY  07/14/2018   Procedure: BIOPSY;  Surgeon: Jackquline Denmark, MD;  Location: [facility] ENDOSCOPY;  Service: Endoscopy;;  BIOPSY  07/03/2020   Procedure: BIOPSY;  Surgeon: Lavena Bullion, DO;  Location: [place] ENDOSCOPY;  Service: Gastroenterology;;   CHOLECYSTECTOMY     COLONOSCOPY WITH PROPOFOL N/A 08/01/2020   Procedure: COLONOSCOPY WITH PROPOFOL;  Surgeon: Harvel Quale, MD;  [facility];  Service: Gastroenterology;  Laterality: N/A;  1130   ESOPHAGEAL DILATION N/A 03/03/2016   Procedure: ESOPHAGEAL  DILATION;  Surgeon: Rogene Houston, MD;  [facility];  Service: Endoscopy;  Laterality: N/A;   ESOPHAGEAL DILATION N/A 05/07/2016   Procedure: ESOPHAGEAL DILATION;  Surgeon: Rogene Houston, MD;  [facility];  Service: Endoscopy;  Laterality: N/A;   ESOPHAGEAL DILATION  01/26/2018   Procedure: DILATION OF ANASTOMOTIC  STRICTURE;  Surgeon: Rogene Houston, MD;  [facility];  Service: Endoscopy;;   ESOPHAGEAL DILATION N/A 08/01/2020   Procedure: ESOPHAGEAL DILATION;  Surgeon: Harvel Quale, MD;  [facility];  Service: Gastroenterology;  Laterality: N/A;   ESOPHAGOGASTRODUODENOSCOPY N/A 03/03/2016   Procedure: ESOPHAGOGASTRODUODENOSCOPY (EGD);  Surgeon: Rogene Houston, MD;  [facility];  Service: Endoscopy;  Laterality: N/A;  2:00   ESOPHAGOGASTRODUODENOSCOPY N/A 05/07/2016   Procedure: ESOPHAGOGASTRODUODENOSCOPY (EGD);  Surgeon: Rogene Houston, MD;  [facility];  Service: Endoscopy;  Laterality: N/A;  855 - moved to 6/2 @ 10:15 - Ann notified pt   ESOPHAGOGASTRODUODENOSCOPY N/A 12/28/2017   Procedure: ESOPHAGOGASTRODUODENOSCOPY (EGD) with stricture dilation;  Surgeon: Rogene Houston, MD;  [facility];  Service: Endoscopy;  Laterality: N/A;   ESOPHAGOGASTRODUODENOSCOPY N/A 01/26/2018   Procedure: ESOPHAGOGASTRODUODENOSCOPY (EGD);  Surgeon: Rogene Houston, MD;  [facility];  Service: Endoscopy;  Laterality: N/A;  255   ESOPHAGOGASTRODUODENOSCOPY N/A 07/14/2018   Procedure: ESOPHAGOGASTRODUODENOSCOPY (EGD);  Surgeon: Jackquline Denmark, MD;  Location: [facility] ENDOSCOPY;  Service: Endoscopy;  Laterality: N/A;   ESOPHAGOGASTRODUODENOSCOPY (EGD) WITH PROPOFOL N/A 03/05/2016   Procedure: ESOPHAGOGASTRODUODENOSCOPY (EGD) WITH PROPOFOL Anastomotic stricture dilation ;  Surgeon: Rogene Houston, MD;  [facility];  Service: Endoscopy;  Laterality: N/A;  to be done in OR under fluoro    ESOPHAGOGASTRODUODENOSCOPY (EGD) WITH PROPOFOL N/A 08/22/2019   Procedure: ESOPHAGOGASTRODUODENOSCOPY (EGD) WITH PROPOFOL With anastomotic stricture dilation.;  Surgeon: Rogene Houston, MD;  [facility];  Service: Endoscopy;  Laterality: N/A;   ESOPHAGOGASTRODUODENOSCOPY (EGD) WITH PROPOFOL N/A 08/24/2019   Procedure: ESOPHAGOGASTRODUODENOSCOPY (EGD) WITH PROPOFOL with stricture dilation;  Surgeon: Rogene Houston, MD;  [facility];  Service: Endoscopy;  Laterality: N/A;   ESOPHAGOGASTRODUODENOSCOPY (EGD) WITH PROPOFOL N/A 07/03/2020   Procedure: ESOPHAGOGASTRODUODENOSCOPY (EGD) WITH PROPOFOL;  Surgeon: Lavena Bullion, DO;  Location: [place];  Service: Gastroenterology;  Laterality: N/A;   ESOPHAGOGASTRODUODENOSCOPY (EGD) WITH PROPOFOL N/A 08/01/2020   Procedure: ESOPHAGOGASTRODUODENOSCOPY (EGD) WITH PROPOFOL;  Surgeon: Harvel Quale, MD;  [facility];  Service: Gastroenterology;  Laterality: N/A;   ESOPHAGOGASTRODUODENOSCOPY (EGD) WITH PROPOFOL N/A 08/29/2020   Procedure: ESOPHAGOGASTRODUODENOSCOPY (EGD) WITH PROPOFOL;  Surgeon: Harvel Quale, MD;  [facility];  Service: Gastroenterology;  Laterality: N/A;  10:00   ESOPHAGOGASTRODUODENOSCOPY (EGD) WITH PROPOFOL N/A 10/03/2020   Procedure: ESOPHAGOGASTRODUODENOSCOPY (EGD) WITH PROPOFOL;  Surgeon: Harvel Quale, MD;  [facility];  Service: Gastroenterology;  Laterality: N/A;  200   ESOPHAGOGASTRODUODENOSCOPY (EGD) WITH PROPOFOL N/A 12/19/2020   Procedure: ESOPHAGOGASTRODUODENOSCOPY (EGD) WITH PROPOFOL;  Surgeon: Harvel Quale, MD;  [facility];  Service: Gastroenterology;  Laterality:  N/A;  [place] REMOVAL  07/03/2020   Procedure: FOREIGN BODY REMOVAL;  Surgeon: Lavena Bullion, DO;  Location: [place] ENDOSCOPY;  Service: Gastroenterology;;   HIP SURGERY Right 09/2020   HIP SURGERY Left 07/2020   INTRAMEDULLARY (IM)  NAIL INTERTROCHANTERIC Right 07/09/2018   Procedure: INTRAMEDULLARY (IM) NAIL INTERTROCHANTRIC;  Surgeon: Nicholes Stairs, MD;  Location: [place];  Service: Orthopedics;  Laterality: Right;   INTRAMEDULLARY (IM) NAIL INTERTROCHANTERIC Left 06/27/2020   Procedure: INTRAMEDULLARY (IM) NAIL INTERTROCHANTRIC;  Surgeon: Nicholes Stairs, MD;  Location: [place];  Service: Orthopedics;  Laterality: Left;   KENALOG INJECTION  07/03/2020   Procedure: KENALOG INJECTION;  Surgeon: Lavena Bullion, DO;  Location: [facility];  Service: Gastroenterology;;   STOMACH SURGERY     TOTAL HIP ARTHROPLASTY Bilateral     Current Outpatient Medications  Medication Sig Dispense Refill   baclofen (LIORESAL) 10 MG tablet Take 10 mg by mouth daily.     Cyanocobalamin 1000 MCG/ML KIT Inject 1,000 mcg into the skin every 30 (thirty) days.     famotidine (PEPCID) 20 MG tablet Take 20 mg by mouth daily.     furosemide (LASIX) 20 MG tablet Take 20 mg by mouth daily. (Patient not taking: Reported on 09/22/2022)     meloxicam (MOBIC) 7.5 MG tablet Take 7.5 mg by mouth daily.     Multiple Vitamin (MULTIVITAMIN WITH MINERALS) TABS tablet Take 1 tablet by mouth daily. Multivitamin for Women 50+     nicotine (NICODERM CQ - DOSED IN MG/24 HOURS) 14 mg/24hr patch Place 1 patch (14 mg total) onto the skin daily. (Patient not taking: Reported on 09/22/2022) 28 patch 0   PROVENTIL HFA 108 (90 Base) MCG/ACT inhaler Inhale 1 puff into the lungs every 6 (six) hours as needed for wheezing or shortness of breath.     traZODone (DESYREL) 100 MG tablet Take 100 mg by mouth at bedtime. (Patient not taking: Reported on 09/22/2022)     No current facility-administered medications for this visit.    Allergies as of 09/27/2022 - Review Complete 09/22/2022  Allergen Reaction Noted   Penicillins Itching, Swelling, Rash, and Other (See Comments) 02/03/2016    Family History  Problem Relation Age of Onset   Alzheimer's disease Mother     COPD Mother    Throat cancer Father    Breast cancer Sister     Social History   Socioeconomic History   Marital status: Widowed    Spouse name: Not on file   Number of children: Not on file   Years of education: Not on file   Highest education level: Not on file  Occupational History   Not on file  Tobacco Use   Smoking status: Every Day    Packs/day: 1.50    Years: 20.00    Total pack years: 30.00    Types: Cigarettes   Smokeless tobacco: Never  Vaping Use   Vaping Use: Never used  Substance and Sexual Activity   Alcohol use: Not Currently    Alcohol/week: 12.0 standard drinks of alcohol    Types: 12 Cans of beer per week    Comment: weekly   Drug use: No   Sexual activity: Yes    Birth control/protection: None  Other Topics Concern   Not on file  Social History Narrative   Not on file   Social Determinants of Health   Financial Resource Strain: Not on file  Food Insecurity: Not on file  Transportation  Needs: Not on file  Physical Activity: Not on file  Stress: Not on file  Social Connections: Not on file    Review of Systems: Gen: Denies fever, chills, cold or flulike symptoms, presyncope, syncope. CV: Denies chest pain, palpitations. Resp: Denies dyspnea, cough.  GI: See HPI Heme: See HPI  Physical Exam: There were no vitals taken for this visit. General:   Alert and oriented. No distress noted. Pleasant and cooperative.  Head:  Normocephalic and atraumatic. Eyes:  Conjuctiva clear without scleral icterus. Heart:  S1, S2 present without murmurs appreciated. Lungs:  Clear to auscultation bilaterally. No wheezes, rales, or rhonchi. No distress.  Abdomen:  +BS, soft, non-tender and non-distended. No rebound or guarding. No HSM or masses noted. Msk:  Symmetrical without gross deformities. Normal posture. Extremities:  Without edema. Neurologic:  Alert and  oriented x4 Psych:  Normal mood and affect.    Assessment:     Plan:   ***   Aliene Altes, PA-C [facility] 09/27/2022

## 2022-09-25 NOTE — H&P (View-Only) (Signed)
Referring Provider: Monico Blitz, MD Primary Care Physician:  Monico Blitz, MD Primary GI Physician: Dr. Jenetta Downer  Chief Complaint  Patient presents with   Follow-up    Food feels like it is stuck in throat. Makes herself vomit     HPI:   Kristine UNCAPHER is a 66 y.o. 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   She was admitted in April 2023 to [facility] with aspiration pneumonitis.  During this admission, also noted severe protein calorie malnutrition, failure to thrive, and history of jejunostomy stenosis.  Possible PEG tube placement was discussed with Dr. Rocco Serene who recommended GI referral based on previous findings in the chart, may need balloon dilation.  Recently seen at [facility] emergency department 09/22/2022 for failure to thrive, presenting with weakness.  Also reported trouble swallowing food over the past several weeks, unintentional weight loss over the last few years.  Shortness of breath trouble.  Hemoglobin mildly low at 11.0 which was stable.  No other significant laboratory abnormalities.  CT head with no acute findings.  CT chest abdomen and pelvis without contrast with extensive multivessel coronary artery calcification, COPD, near complete resolution of previous multifocal pulmonary infiltrate, no acute intra-abdominal pathology.  She was discharged home and advised to follow-up with GI.  Patient has not been seen by GI since her last EGD in January 2022.  Today:  Sugars are dropping as she is unable to eat well.  When she swallows, she feels foods aren't going down. Sitting in the mid esophagus. Reports these symptoms have been going on for years. When the foods get stuck, she panics and makes herself vomit. This occurs when solid foods, soft foods, and  liquids like boost at times. Water goes down fine. Pills are going down fine.   Reports history of eating disorder. Was going to get help when she lived in [place] a few years ago, and hasn't seen anyone for this.  Has heartburn daily. Taking famotidine some days but this doesn't help. Also takes tums. Hasn't been on Dexilant/PPI for at least a year.   No abdominal pain. Occasional nausea, but this is chronic and intermittent. No brbpr or melena. Bowels moving well.   NSAIDs: Taking a couple BC powders daily for arthritis. Also taking meloxicam.  Tobacco: Smokes about 1 pack a day. Has chantix at home, but hasn't started this.   Wt Readings from Last 6 Encounters:  09/27/22 80 lb 9.6 oz (36.6 kg)  09/21/22 72 lb (32.7 kg)  02/14/22 74 lb 15.3 oz (34 kg)  12/29/21 75 lb (34 kg)  12/17/20 79 lb (35.8 kg)  09/30/20 78 lb (35.4 kg)     Last EGD 12/19/2020: Grade B esophagitis, stenosed Billroth II gastrojejunostomy with congestion, edema and ulceration s/p dilation.  Recommended repeat EGD in 4-6 weeks for retreatment.  No solid food for 3 days prior, continue Dexilant 30 mg twice daily.  Colonoscopy 08/01/2020: Diverticulosis in sigmoid and descending colon.  Repeat in 10 years.   Past Medical History:  Diagnosis Date   COPD (chronic obstructive pulmonary disease) (HCC)    Dysphagia    Dysrhythmia    Esophagitis    Gastric outlet obstruction    Gastric stenosis    GERD (gastroesophageal reflux disease)    Grade II diastolic dysfunction    Hiatal hernia  Osteoporosis    PUD (peptic ulcer disease)    Wounds, multiple     Past Surgical History:  Procedure Laterality Date   APPENDECTOMY     BALLOON DILATION N/A 03/05/2016   Procedure: BALLOON DILATION;  Surgeon: Rogene Houston, MD;  [facility];  Service: Endoscopy;  Laterality: N/A;  pyloric channel dialtaion   BALLOON DILATION N/A 07/14/2018   Procedure: BALLOON DILATION;  Surgeon: Jackquline Denmark, MD;   Location: [facility] ENDOSCOPY;  Service: Endoscopy;  Laterality: N/A;   BALLOON DILATION  10/03/2020   Procedure: BALLOON DILATION;  Surgeon: Montez Morita, Quillian Quince, MD;  [facility];  Service: Gastroenterology;;   Larrie Kass DILATION  12/19/2020   Procedure: [place];  Surgeon: Montez Morita, Quillian Quince, MD;  [facility];  Service: Gastroenterology;;   BIOPSY  07/14/2018   Procedure: BIOPSY;  Surgeon: Jackquline Denmark, MD;  Location: [facility] ENDOSCOPY;  Service: Endoscopy;;   BIOPSY  07/03/2020   Procedure: BIOPSY;  Surgeon: Lavena Bullion, DO;  Location: [place] ENDOSCOPY;  Service: Gastroenterology;;   CHOLECYSTECTOMY     COLONOSCOPY WITH PROPOFOL N/A 08/01/2020   Surgeon: Montez Morita, Quillian Quince, MD;    Diverticulosis in sigmoid and descending colon.  Repeat in 10 years.   ESOPHAGEAL DILATION N/A 03/03/2016   Procedure: ESOPHAGEAL DILATION;  Surgeon: Rogene Houston, MD;  [facility];  Service: Endoscopy;  Laterality: N/A;   ESOPHAGEAL DILATION N/A 05/07/2016   Procedure: ESOPHAGEAL DILATION;  Surgeon: Rogene Houston, MD;  [facility];  Service: Endoscopy;  Laterality: N/A;   ESOPHAGEAL DILATION  01/26/2018   Procedure: DILATION OF ANASTOMOTIC  STRICTURE;  Surgeon: Rogene Houston, MD;  [facility];  Service: Endoscopy;;   ESOPHAGEAL DILATION N/A 08/01/2020   Procedure: ESOPHAGEAL DILATION;  Surgeon: Harvel Quale, MD;  [facility];  Service: Gastroenterology;  Laterality: N/A;   ESOPHAGOGASTRODUODENOSCOPY N/A 03/03/2016   Procedure: ESOPHAGOGASTRODUODENOSCOPY (EGD);  Surgeon: Rogene Houston, MD;  [facility];  Service: Endoscopy;  Laterality: N/A;  2:00   ESOPHAGOGASTRODUODENOSCOPY N/A 05/07/2016   Procedure: ESOPHAGOGASTRODUODENOSCOPY (EGD);  Surgeon: Rogene Houston, MD;  [facility];  Service: Endoscopy;  Laterality: N/A;  855 - moved to 6/2 @ 10:15 - Ann notified pt    ESOPHAGOGASTRODUODENOSCOPY N/A 12/28/2017   Procedure: ESOPHAGOGASTRODUODENOSCOPY (EGD) with stricture dilation;  Surgeon: Rogene Houston, MD;  [facility];  Service: Endoscopy;  Laterality: N/A;   ESOPHAGOGASTRODUODENOSCOPY N/A 01/26/2018   Procedure: ESOPHAGOGASTRODUODENOSCOPY (EGD);  Surgeon: Rogene Houston, MD;  [facility];  Service: Endoscopy;  Laterality: N/A;  255   ESOPHAGOGASTRODUODENOSCOPY N/A 07/14/2018   Procedure: ESOPHAGOGASTRODUODENOSCOPY (EGD);  Surgeon: Jackquline Denmark, MD;  Location: [facility] ENDOSCOPY;  Service: Endoscopy;  Laterality: N/A;   ESOPHAGOGASTRODUODENOSCOPY (EGD) WITH PROPOFOL N/A 03/05/2016   Procedure: ESOPHAGOGASTRODUODENOSCOPY (EGD) WITH PROPOFOL Anastomotic stricture dilation ;  Surgeon: Rogene Houston, MD;  [facility];  Service: Endoscopy;  Laterality: N/A;  to be done in OR under fluoro   ESOPHAGOGASTRODUODENOSCOPY (EGD) WITH PROPOFOL N/A 08/22/2019   Procedure: ESOPHAGOGASTRODUODENOSCOPY (EGD) WITH PROPOFOL With anastomotic stricture dilation.;  Surgeon: Rogene Houston, MD;  [facility];  Service: Endoscopy;  Laterality: N/A;   ESOPHAGOGASTRODUODENOSCOPY (EGD) WITH PROPOFOL N/A 08/24/2019   Procedure: ESOPHAGOGASTRODUODENOSCOPY (EGD) WITH PROPOFOL with stricture dilation;  Surgeon: Rogene Houston, MD;  [facility];  Service: Endoscopy;  Laterality: N/A;   ESOPHAGOGASTRODUODENOSCOPY (EGD) WITH PROPOFOL  N/A 07/03/2020   Procedure: ESOPHAGOGASTRODUODENOSCOPY (EGD) WITH PROPOFOL;  Surgeon: Lavena Bullion, DO;  Location: [place];  Service: Gastroenterology;  Laterality: N/A;   ESOPHAGOGASTRODUODENOSCOPY (EGD) WITH PROPOFOL N/A 08/01/2020   Surgeon: Montez Morita, Quillian Quince, MD;  Grade C esophagitis, stenosed Billroth I gastro duodenostomy with gastric diverticula near the outlet, clean-based ulcer adjacent s/p dilation.   ESOPHAGOGASTRODUODENOSCOPY (EGD) WITH PROPOFOL N/A 08/29/2020   Surgeon:  Montez Morita, Quillian Quince, MD;  Grade B esophagitis without bleeding, large amount of food in the stomach, stenosed Billroth II gastrojejunostomy.   ESOPHAGOGASTRODUODENOSCOPY (EGD) WITH PROPOFOL N/A 10/03/2020   Surgeon: Montez Morita, Grade C esophagitis, stenosed Billroth I gastroduodenostomy with congestion, edema, stenosis s/p dilation.   ESOPHAGOGASTRODUODENOSCOPY (EGD) WITH PROPOFOL N/A 12/19/2020   Surgeon: Montez Morita, Quillian Quince, MD;   Grade B esophagitis, stenosed Billroth II gastrojejunostomy with congestion, edema and ulceration s/p dilation.   FOREIGN BODY REMOVAL  07/03/2020   Procedure: FOREIGN BODY REMOVAL;  Surgeon: Lavena Bullion, DO;  Location: [place] ENDOSCOPY;  Service: Gastroenterology;;   HIP SURGERY Right 09/2020   HIP SURGERY Left 07/2020   INTRAMEDULLARY (IM) NAIL INTERTROCHANTERIC Right 07/09/2018   Procedure: INTRAMEDULLARY (IM) NAIL INTERTROCHANTRIC;  Surgeon: Nicholes Stairs, MD;  Location: [place];  Service: Orthopedics;  Laterality: Right;   INTRAMEDULLARY (IM) NAIL INTERTROCHANTERIC Left 06/27/2020   Procedure: INTRAMEDULLARY (IM) NAIL INTERTROCHANTRIC;  Surgeon: Nicholes Stairs, MD;  Location: [place];  Service: Orthopedics;  Laterality: Left;   KENALOG INJECTION  07/03/2020   Procedure: KENALOG INJECTION;  Surgeon: Lavena Bullion, DO;  Location: [facility] ENDOSCOPY;  Service: Gastroenterology;;   STOMACH SURGERY     TOTAL HIP ARTHROPLASTY Bilateral     Current Outpatient Medications  Medication Sig Dispense Refill   azithromycin (ZITHROMAX) 250 MG tablet Take by mouth.     baclofen (LIORESAL) 10 MG tablet Take 10 mg by mouth daily.     Cyanocobalamin 1000 MCG/ML KIT Inject 1,000 mcg into the skin every 30 (thirty) days.     famotidine (PEPCID) 20 MG tablet Take 20 mg by mouth daily.     furosemide (LASIX) 20 MG tablet Take 40 mg by mouth daily. When she has swelling.     meloxicam (MOBIC) 7.5 MG tablet Take 7.5 mg by mouth daily.     Multiple  Vitamin (MULTIVITAMIN WITH MINERALS) TABS tablet Take 1 tablet by mouth daily. Multivitamin for Women 50+     pantoprazole (PROTONIX) 40 MG tablet Take 1 tablet (40 mg total) by mouth 2 (two) times daily. 60 tablet 3   PROVENTIL HFA 108 (90 Base) MCG/ACT inhaler Inhale 1 puff into the lungs every 6 (six) hours as needed for wheezing or shortness of breath.     traZODone (DESYREL) 100 MG tablet Take 100 mg by mouth at bedtime.     nicotine (NICODERM CQ - DOSED IN MG/24 HOURS) 14 mg/24hr patch Place 1 patch (14 mg total) onto the skin daily. (Patient not taking: Reported on 09/22/2022) 28 patch 0   No current facility-administered medications for this visit.    Allergies as of 09/27/2022 - Review Complete 09/27/2022  Allergen Reaction Noted   Penicillins Itching, Swelling, Rash, and Other (See Comments) 02/03/2016    Family History  Problem Relation Age of Onset   Alzheimer's disease Mother    COPD Mother    Throat cancer Father    Breast cancer Sister    Colon cancer Neg Hx    Gastric cancer Neg Hx     Social  History   Socioeconomic History   Marital status: Widowed    Spouse name: Not on file   Number of children: Not on file   Years of education: Not on file   Highest education level: Not on file  Occupational History   Not on file  Tobacco Use   Smoking status: Every Day    Packs/day: 1.50    Years: 20.00    Total pack years: 30.00    Types: Cigarettes   Smokeless tobacco: Never  Vaping Use   Vaping Use: Never used  Substance and Sexual Activity   Alcohol use: Not Currently    Alcohol/week: 12.0 standard drinks of alcohol    Types: 12 Cans of beer per week    Comment: 1 beer a week.   Drug use: No   Sexual activity: Yes    Birth control/protection: None  Other Topics Concern   Not on file  Social History Narrative   Not on file   Social Determinants of Health   Financial Resource Strain: Not on file  Food Insecurity: Not on file  Transportation Needs: Not  on file  Physical Activity: Not on file  Stress: Not on file  Social Connections: Not on file    Review of Systems: Gen: Denies fever, chills, cold or flulike symptoms, presyncope, syncope. CV: Denies chest pain, palpitations. Resp: Denies dyspnea, cough.  GI: See HPI Heme: See HPI  Physical Exam: BP 107/60 (BP Location: Right Arm, Patient Position: Sitting, Cuff Size: Small)   Pulse (!) 56   Temp 97.6 F (36.4 C) (Temporal)   Ht 5' 2"  (1.575 m)   Wt 80 lb 9.6 oz (36.6 kg)   SpO2 97%   BMI 14.74 kg/m  General:   Alert and oriented. No distress noted. Pleasant and cooperative. Thin/underweight.  Head:  Normocephalic and atraumatic. Eyes:  Conjuctiva clear without scleral icterus. Heart:  S1, S2 present without murmurs appreciated. Lungs:  Clear to auscultation bilaterally. No wheezes, rales, or rhonchi. No distress.  Abdomen:  +BS, soft, non-tender and non-distended. No rebound or guarding. No HSM or masses noted. Msk:  Symmetrical without gross deformities. Normal posture. Extremities:  Without edema. Neurologic:  Alert and  oriented x4 Psych:  Normal mood and affect.    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  Dysphagia/GERD: Few year history of dysphagia with solid/soft foods and intermittently liquids getting stuck in her mid chest.  She will self-induced vomiting thereafter which makes her feel better.  Because she is not able to eat well, she reports her sugars have been dropping, but it appears that her weight has been stable. Also with chronic GERD that is uncontrolled as she is been off PPI for at least a year.  Denies abdominal pain, BRBPR, melena.  Taking a couple of BC powders daily and also recently started  meloxicam.  Smokes about 1 pack a day. Last EGD January 2022 with Grade B esophagitis, stenosed Billroth II gastrojejunostomy with congestion, edema and ulceration s/p dilation.  Recommended repeat EGD in 4-6 weeks for retreatment.  No solid food for 3 days prior. Repeat EGD was not completed.   I suspect dysphagia is likely secondary to uncontrolled GERD with esophagitis/esophageal stricture, unable to rule out malignancy.  She likely also has persistent anastomotic ulceration in the setting  of ongoing NSAID use, tobacco use, and lack of PPI.  We will restart PPI twice daily and plan for EGD as soon as possible.   Plan:  Proceed with upper endoscopy +/- esophageal/anastomotic dilation as needed with propofol by Dr. Jenetta Downer in near future. The risks, benefits, and alternatives have been discussed with the patient in detail. The patient states understanding and desires to proceed.  ASA 3 No solid foods 3 day prior to EGD as Dr. Jenetta Downer previously recommended due to chronic anastomotic stricture.  Start pantoprazole 40 mg twice daily. Recommended liquid diet for now as she is unable to tolerate solid foods.  Recommended at least 3 protein shakes daily and also drinking liquids that contain electrolytes and sugar such as Gatorade as she reports her sugars have been dropping. Recommended proceeding to the emergency room if she is unable to tolerate liquids. Counseled on the importance of strict NSAID avoidance.   Advised to use Tylenol as needed for pain.  No more than 2000 mg of Tylenol per 24 hours. Counseled on the importance of smoking cessation. Follow-up after EGD.   Aliene Altes, PA-C [facility]astroenter[facility] 09/27/2022  I have reviewed the note and agree with the APP's assessment as described in this progress note.  Very unfortunate case. Patient very well known to me. History of NSAID and high dose aspirin use despite history of gastric ulcers requiring surgical approach. Likely  is presenting recurrent anastomotic stricture.  Maylon Peppers, MD Gastroenterology and Hepatology [facility]astroenter[facility]

## 2022-09-27 ENCOUNTER — Ambulatory Visit (INDEPENDENT_AMBULATORY_CARE_PROVIDER_SITE_OTHER): Payer: Medicare HMO | Admitting: Gastroenterology

## 2022-09-27 ENCOUNTER — Encounter (INDEPENDENT_AMBULATORY_CARE_PROVIDER_SITE_OTHER): Payer: Self-pay

## 2022-09-27 ENCOUNTER — Other Ambulatory Visit (INDEPENDENT_AMBULATORY_CARE_PROVIDER_SITE_OTHER): Payer: Self-pay

## 2022-09-27 ENCOUNTER — Encounter: Payer: Self-pay | Admitting: Gastroenterology

## 2022-09-27 VITALS — BP 107/60 | HR 56 | Temp 97.6°F | Ht 62.0 in | Wt 80.6 lb

## 2022-09-27 DIAGNOSIS — Z8711 Personal history of peptic ulcer disease: Secondary | ICD-10-CM | POA: Diagnosis not present

## 2022-09-27 DIAGNOSIS — K219 Gastro-esophageal reflux disease without esophagitis: Secondary | ICD-10-CM

## 2022-09-27 DIAGNOSIS — R1310 Dysphagia, unspecified: Secondary | ICD-10-CM

## 2022-09-27 MED ORDER — PANTOPRAZOLE SODIUM 40 MG PO TBEC: 40.0000 mg | DELAYED_RELEASE_TABLET | Freq: Two times a day (BID) | ORAL | 3 refills | Status: DC

## 2022-09-27 NOTE — Patient Instructions (Addendum)
Start pantoprazole 40 mg twice daily 30 minutes before breakfast and dinner.  We will arrange for you to have an upper endoscopy with possible dilation in the near future with Dr. Jenetta Downer. No solid foods 3 days prior to your upper endoscopy.  Stop taking BC powders and meloxicam. Avoid all NSAID products which includes BC powders, meloxicam, ibuprofen, Aleve, Advil, naproxen, and anything that says "NSAID" on the package.  You may use Tylenol as needed for pain.  No more than 3000 mg of Tylenol per 24 hours.  As we discussed, is also very important that you work towards complete smoking cessation.  Stick with a liquid diet for now.  Drink 3 protein shakes daily. Looks for protein shakes that have at least 25-30 g of protein. You can also makes your own if you would like with milk. Try drinking liquids that contain electrolytes and sugar such as Gatorade.   If you are unable to tolerate liquids, you need to proceed to the emergency room.   We will see you back after your procedure.  It was nice to meet you today!  Aliene Altes, PA-C [facility]

## 2022-09-28 ENCOUNTER — Encounter (INDEPENDENT_AMBULATORY_CARE_PROVIDER_SITE_OTHER): Payer: Self-pay

## 2022-10-05 DIAGNOSIS — Z23 Encounter for immunization: Secondary | ICD-10-CM | POA: Diagnosis not present

## 2022-10-05 DIAGNOSIS — H6693 Otitis media, unspecified, bilateral: Secondary | ICD-10-CM | POA: Diagnosis not present

## 2022-10-05 DIAGNOSIS — Z299 Encounter for prophylactic measures, unspecified: Secondary | ICD-10-CM | POA: Diagnosis not present

## 2022-10-05 DIAGNOSIS — Z681 Body mass index (BMI) 19 or less, adult: Secondary | ICD-10-CM | POA: Diagnosis not present

## 2022-10-05 DIAGNOSIS — Z713 Dietary counseling and surveillance: Secondary | ICD-10-CM | POA: Diagnosis not present

## 2022-10-05 DIAGNOSIS — R0981 Nasal congestion: Secondary | ICD-10-CM | POA: Diagnosis not present

## 2022-10-08 ENCOUNTER — Encounter (HOSPITAL_COMMUNITY): Payer: Self-pay

## 2022-10-08 ENCOUNTER — Encounter (HOSPITAL_COMMUNITY)
Admission: RE | Admit: 2022-10-08 | Discharge: 2022-10-08 | Disposition: A | Discharge status: Home / Self Care | Payer: Medicare HMO | Source: Ambulatory Visit | Attending: Gastroenterology | Admitting: Gastroenterology

## 2022-10-08 HISTORY — DX: Unspecified osteoarthritis, unspecified site: M19.90

## 2022-10-08 HISTORY — DX: Anemia, unspecified: D64.9

## 2022-10-08 HISTORY — DX: Depression, unspecified: F32.A

## 2022-10-12 ENCOUNTER — Ambulatory Visit (HOSPITAL_BASED_OUTPATIENT_CLINIC_OR_DEPARTMENT_OTHER): Payer: Medicare HMO | Admitting: Anesthesiology

## 2022-10-12 ENCOUNTER — Telehealth (INDEPENDENT_AMBULATORY_CARE_PROVIDER_SITE_OTHER): Payer: Self-pay | Admitting: *Deleted

## 2022-10-12 ENCOUNTER — Other Ambulatory Visit: Payer: Self-pay

## 2022-10-12 ENCOUNTER — Encounter (INDEPENDENT_AMBULATORY_CARE_PROVIDER_SITE_OTHER): Payer: Self-pay | Admitting: *Deleted

## 2022-10-12 ENCOUNTER — Encounter (HOSPITAL_COMMUNITY)
Admission: RE | Disposition: A | Discharge status: Home / Self Care | Payer: Self-pay | Source: Ambulatory Visit | Attending: Gastroenterology

## 2022-10-12 ENCOUNTER — Ambulatory Visit (HOSPITAL_COMMUNITY)
Admission: RE | Admit: 2022-10-12 | Discharge: 2022-10-12 | Disposition: A | Discharge status: Home / Self Care | Payer: Medicare HMO | Source: Ambulatory Visit | Attending: Gastroenterology | Admitting: Gastroenterology

## 2022-10-12 ENCOUNTER — Ambulatory Visit (HOSPITAL_COMMUNITY): Payer: Medicare HMO | Admitting: Anesthesiology

## 2022-10-12 ENCOUNTER — Encounter (HOSPITAL_COMMUNITY): Payer: Self-pay | Admitting: Gastroenterology

## 2022-10-12 DIAGNOSIS — K209 Esophagitis, unspecified without bleeding: Secondary | ICD-10-CM | POA: Diagnosis not present

## 2022-10-12 DIAGNOSIS — J189 Pneumonia, unspecified organism: Secondary | ICD-10-CM | POA: Diagnosis not present

## 2022-10-12 DIAGNOSIS — M1990 Unspecified osteoarthritis, unspecified site: Secondary | ICD-10-CM | POA: Diagnosis not present

## 2022-10-12 DIAGNOSIS — Z79899 Other long term (current) drug therapy: Secondary | ICD-10-CM | POA: Insufficient documentation

## 2022-10-12 DIAGNOSIS — R112 Nausea with vomiting, unspecified: Secondary | ICD-10-CM | POA: Diagnosis not present

## 2022-10-12 DIAGNOSIS — E43 Unspecified severe protein-calorie malnutrition: Secondary | ICD-10-CM | POA: Diagnosis not present

## 2022-10-12 DIAGNOSIS — Z903 Acquired absence of stomach [part of]: Secondary | ICD-10-CM | POA: Diagnosis not present

## 2022-10-12 DIAGNOSIS — R634 Abnormal weight loss: Secondary | ICD-10-CM | POA: Diagnosis not present

## 2022-10-12 DIAGNOSIS — K289 Gastrojejunal ulcer, unspecified as acute or chronic, without hemorrhage or perforation: Secondary | ICD-10-CM | POA: Diagnosis not present

## 2022-10-12 DIAGNOSIS — Z681 Body mass index (BMI) 19 or less, adult: Secondary | ICD-10-CM | POA: Diagnosis not present

## 2022-10-12 DIAGNOSIS — K2100 Gastro-esophageal reflux disease with esophagitis, without bleeding: Secondary | ICD-10-CM | POA: Diagnosis not present

## 2022-10-12 DIAGNOSIS — F17210 Nicotine dependence, cigarettes, uncomplicated: Secondary | ICD-10-CM | POA: Diagnosis not present

## 2022-10-12 DIAGNOSIS — R1310 Dysphagia, unspecified: Secondary | ICD-10-CM

## 2022-10-12 DIAGNOSIS — J449 Chronic obstructive pulmonary disease, unspecified: Secondary | ICD-10-CM

## 2022-10-12 DIAGNOSIS — K219 Gastro-esophageal reflux disease without esophagitis: Secondary | ICD-10-CM | POA: Diagnosis not present

## 2022-10-12 DIAGNOSIS — Z980 Intestinal bypass and anastomosis status: Secondary | ICD-10-CM | POA: Diagnosis not present

## 2022-10-12 DIAGNOSIS — R69 Illness, unspecified: Secondary | ICD-10-CM | POA: Diagnosis not present

## 2022-10-12 HISTORY — PX: ESOPHAGOGASTRODUODENOSCOPY (EGD) WITH PROPOFOL: SHX5813

## 2022-10-12 HISTORY — PX: ESOPHAGEAL DILATION: SHX303

## 2022-10-12 SURGERY — ESOPHAGOGASTRODUODENOSCOPY (EGD) WITH PROPOFOL
Anesthesia: General

## 2022-10-12 MED ORDER — LIDOCAINE HCL 1 % IJ SOLN
INTRAMUSCULAR | Status: DC | PRN
  Administered 2022-10-12: 50 mg via INTRADERMAL

## 2022-10-12 MED ORDER — LIDOCAINE VISCOUS HCL 2 % MT SOLN
OROMUCOSAL | Status: AC
  Filled 2022-10-12: qty 15

## 2022-10-12 MED ORDER — PROPOFOL 10 MG/ML IV BOLUS
INTRAVENOUS | Status: DC | PRN
  Administered 2022-10-12: 25 mg via INTRAVENOUS
  Administered 2022-10-12: 75 mg via INTRAVENOUS

## 2022-10-12 MED ORDER — LACTATED RINGERS IV SOLN: INTRAVENOUS | Status: DC

## 2022-10-12 MED ORDER — LIDOCAINE VISCOUS HCL 2 % MT SOLN
10.0000 mL | Freq: Once | OROMUCOSAL | Status: AC
  Administered 2022-10-12: 10 mL via OROMUCOSAL

## 2022-10-12 MED ORDER — SUCRALFATE 1 GM/10ML PO SUSP: 1.0000 g | Freq: Four times a day (QID) | ORAL | 1 refills | Status: AC

## 2022-10-12 NOTE — Anesthesia Preprocedure Evaluation (Signed)
Anesthesia Evaluation  Patient identified by MRN, date of birth, ID band Patient awake    Reviewed: Allergy & Precautions, H&P , NPO status , Patient's Chart, lab work & pertinent test results  Airway Mallampati: II  TM Distance: >3 FB Neck ROM: Full    Dental  (+) Edentulous Upper, Edentulous Lower   Pulmonary pneumonia, COPD,  COPD inhaler, Current Smoker   Pulmonary exam normal breath sounds clear to auscultation       Cardiovascular Normal cardiovascular exam+ dysrhythmias  Rhythm:Regular Rate:Normal  1. Left ventricular ejection fraction, by estimation, is 60 to 65%. The  left ventricle has normal function. The left ventricle has no regional  wall motion abnormalities. There is moderate left ventricular hypertrophy  of the basal-septal segment. Left  ventricular diastolic parameters are consistent with Grade II diastolic  dysfunction (pseudonormalization).   2. Right ventricular systolic function is normal. The right ventricular  size is normal. There is normal pulmonary artery systolic pressure. The  estimated right ventricular systolic pressure is 83.3 mmHg.   3. Left atrial size was mild to moderately dilated.   4. The mitral valve is normal in structure. Trivial mitral valve  regurgitation. No evidence of mitral stenosis.   5. The aortic valve is normal in structure. Aortic valve regurgitation is  trivial. No aortic stenosis is present.   6. The inferior vena cava is normal in size with greater than 50%  respiratory variability, suggesting right atrial pressure of 3 mmHg.     Neuro/Psych  PSYCHIATRIC DISORDERS  Depression     Neuromuscular disease    GI/Hepatic hiatal hernia, PUD,GERD  Medicated and Poorly Controlled,,(+)     substance abuse  alcohol use  Endo/Other  negative endocrine ROS    Renal/GU negative Renal ROS  negative genitourinary   Musculoskeletal  (+) Arthritis , Osteoarthritis,     Abdominal   Peds negative pediatric ROS (+)  Hematology  (+) Blood dyscrasia, anemia   Anesthesia Other Findings   Reproductive/Obstetrics negative OB ROS                             Anesthesia Physical Anesthesia Plan  ASA: 3  Anesthesia Plan: General   Post-op Pain Management: Minimal or no pain anticipated   Induction: Intravenous  PONV Risk Score and Plan: Propofol infusion  Airway Management Planned: Nasal Cannula and Natural Airway  Additional Equipment:   Intra-op Plan:   Post-operative Plan:   Informed Consent: I have reviewed the patients History and Physical, chart, labs and discussed the procedure including the risks, benefits and alternatives for the proposed anesthesia with the patient or authorized representative who has indicated his/her understanding and acceptance.       Plan Discussed with: CRNA and Surgeon  Anesthesia Plan Comments:        Anesthesia Quick Evaluation

## 2022-10-12 NOTE — Interval H&P Note (Signed)
History and Physical Interval Note:  10/12/2022 9:50 AM  Kristine Roberts  has presented today for surgery, with the diagnosis of Dysphagia GERD.  The various methods of treatment have been discussed with the patient and family. After consideration of risks, benefits and other options for treatment, the patient has consented to  Procedure(s) with comments: ESOPHAGOGASTRODUODENOSCOPY (EGD) WITH PROPOFOL (N/A) - 1045 ASA 3 ESOPHAGEAL DILATION (N/A) as a surgical intervention.  The patient's history has been reviewed, patient examined, no change in status, stable for surgery.  I have reviewed the patient's chart and labs.  Questions were answered to the patient's satisfaction.     Maylon Peppers [place]

## 2022-10-12 NOTE — Anesthesia Postprocedure Evaluation (Signed)
Anesthesia Post Note  Patient: Kristine Roberts  Procedure(s) Performed: ESOPHAGOGASTRODUODENOSCOPY (EGD) WITH PROPOFOL ESOPHAGEAL DILATION  Patient location during evaluation: Short Stay Anesthesia Type: General Level of consciousness: awake and alert Pain management: pain level controlled Vital Signs Assessment: post-procedure vital signs reviewed and stable Respiratory status: spontaneous breathing Cardiovascular status: blood pressure returned to baseline and stable Postop Assessment: no apparent nausea or vomiting Anesthetic complications: no   No notable events documented.   Last Vitals:  Vitals:   10/12/22 0930 10/12/22 1141  BP: 113/61 106/77  Pulse: (!) 46 (!) 57  Resp: 16 15  Temp: (!) 36.3 C 36.4 C  SpO2: 99% 100%    Last Pain:  Vitals:   10/12/22 1141  TempSrc: Oral  PainSc: 0-No pain                 Analynn Daum

## 2022-10-12 NOTE — Transfer of Care (Addendum)
Immediate Anesthesia Transfer of Care Note  Patient: Kristine Roberts  Procedure(s) Performed: ESOPHAGOGASTRODUODENOSCOPY (EGD) WITH PROPOFOL ESOPHAGEAL DILATION  Patient Location: Short Stay  Anesthesia Type:General  Level of Consciousness: awake  Airway & Oxygen Therapy: Patient Spontanous Breathing  Post-op Assessment: Report given to RN  Post vital signs: Reviewed  Last Vitals:  Vitals Value Taken Time  BP 106/77   Temp 36.4   Pulse 57   Resp 16   SpO2 100     Last Pain:  Vitals:   10/12/22 1124  TempSrc:   PainSc: 0-No pain         Complications: No notable events documented.

## 2022-10-12 NOTE — Telephone Encounter (Signed)
-----   Message from Harvel Quale, MD sent at 10/12/2022 11:44 AM EST ----- Hi Maynor Mwangi/Tammy,   Can you please schedule an Egd in next available ? Dx: gastrojejunal ulcer and stenosis. Room: 3 under general anesthesia  Thanks,  Maylon Peppers, MD Gastroenterology and Hepatology [facility]astroenter[facility]

## 2022-10-12 NOTE — Discharge Instructions (Signed)
You are being discharged to home.  Resume previous diet. try to eat smaller meals (5-6 times a day) and chew food thoroughly. Repeat upper endoscopy in next available for evaluation under general anesthesia. PLEASE DO NOT EAT SOLID FOOD 5 DAYS PRIOR TO YOUR NEXT EGD. - Continue pantoprazole 40 mg BID. - Start liquid Carafate 1 g every 6 hours for one month - Smoking cessation is very important - Extremely important to avoid Goody powders, BC powders or other NSAIDs like meloxicam.

## 2022-10-12 NOTE — Op Note (Signed)
[facility] Patient Name: Kristine Roberts Procedure Date: 10/12/2022 11:16 AM MRN: [id number] Date of Birth: Mar 05, 1956 Attending MD: Maylon Peppers , , [id number] CSN: [id number] Age: 66 Admit Type: Outpatient Procedure:                Upper GI endoscopy Indications:              Nausea with vomiting, Weight loss Providers:                Maylon Peppers, Lambert Mody, Everardo Pacific Referring MD:              Medicines:                Monitored Anesthesia Care Complications:            No immediate complications. Estimated Blood Loss:     Estimated blood loss: none. Procedure:                Pre-Anesthesia Assessment:                           - Prior to the procedure, a History and Physical                            was performed, and patient medications, allergies                            and sensitivities were reviewed. The patient's                            tolerance of previous anesthesia was reviewed.                           - The risks and benefits of the procedure and the                            sedation options and risks were discussed with the                            patient. All questions were answered and informed                            consent was obtained.                           - ASA Grade Assessment: IV - A patient with severe                            systemic disease that is a constant threat to life.                           After obtaining informed consent, the endoscope was                            passed under  direct vision. Throughout the                            procedure, the patient's blood pressure, pulse, and                            oxygen saturations were monitored continuously. The                            GIF-H190 ([id number]) scope was introduced through the                            mouth, and advanced to the afferent jejunal loop.                            The upper GI endoscopy  was performed with                            difficulty due to presence of food. The patient                            tolerated the procedure well. Scope In: 11:29:06 AM Scope Out: 11:30:38 AM Total Procedure Duration: 0 hours 1 minute 32 seconds  Findings:      LA Grade A (one or more mucosal breaks less than 5 mm, not extending       between tops of 2 mucosal folds) esophagitis with no bleeding was found       in the lower third of the esophagus.      A large amount of food (residue) was found in the gastric body.      Evidence of a patent Billroth II gastrojejunostomy was found. It was       unclear if there was some degree of stenosis given the amount of food       located in the stomach. The gastrojejunal anastomosis was characterized       by possible ulceration. This was traversed.      The examined jejunum was normal. Impression:               - LA Grade A esophagitis with no bleeding.                           - A large amount of food (residue) in the stomach.                           - Patent Billroth II gastrojejunostomy was found,                            characterized by ulceration.                           - Normal examined jejunum.                           - No specimens collected. Moderate Sedation:      Per Anesthesia Care Recommendation:           -  Discharge patient to home (ambulatory).                           - Resume previous diet. try to eat smaller meals                            (5-6 times a day) and chew food thoroughly.                           - Repeat upper endoscopy in next available for                            evaluation under general anesthesia. PLEASE DO NOT                            EAT SOLID FOOD 5 DAYS PRIOR TO YOUR NEXT EGD.                           - Continue pantoprazole 40 mg BID.                           - Start liquid Carafate 1 g every 6 hours for one                            month                           - Smoking  cessation is very important                           - Extremely important to avoid Goody powders, BC                            powders or other NSAIDs like meloxicam. Procedure Code(s):        --- Professional ---                           [id number], Esophagogastroduodenoscopy, flexible,                            transoral; diagnostic, including collection of                            specimen(s) by brushing or washing, when performed                            (separate procedure) Diagnosis Code(s):        --- Professional ---                           K20.90, Esophagitis, unspecified without bleeding                           Z98.0, Intestinal bypass and anastomosis status  R11.2, Nausea with vomiting, unspecified                           R63.4, Abnormal weight loss CPT copyright 2022 American Medical Association. All rights reserved. The codes documented in this report are preliminary and upon coder review may  be revised to meet current compliance requirements. Maylon Peppers, MD Maylon Peppers,  10/12/2022 11:43:52 AM This report has been signed electronically. Number of Addenda: 0

## 2022-10-12 NOTE — Telephone Encounter (Signed)
Called pt. Offered few November appt's but she wanted a morning appt. Scheduled for 12/21. Aware will send instructions/pre-op appt.

## 2022-10-15 DIAGNOSIS — M549 Dorsalgia, unspecified: Secondary | ICD-10-CM | POA: Diagnosis not present

## 2022-10-15 DIAGNOSIS — R69 Illness, unspecified: Secondary | ICD-10-CM | POA: Diagnosis not present

## 2022-10-15 DIAGNOSIS — F17210 Nicotine dependence, cigarettes, uncomplicated: Secondary | ICD-10-CM | POA: Diagnosis not present

## 2022-10-15 DIAGNOSIS — Z299 Encounter for prophylactic measures, unspecified: Secondary | ICD-10-CM | POA: Diagnosis not present

## 2022-10-15 DIAGNOSIS — J449 Chronic obstructive pulmonary disease, unspecified: Secondary | ICD-10-CM | POA: Diagnosis not present

## 2022-10-15 DIAGNOSIS — G4701 Insomnia due to medical condition: Secondary | ICD-10-CM | POA: Diagnosis not present

## 2022-10-15 DIAGNOSIS — Z681 Body mass index (BMI) 19 or less, adult: Secondary | ICD-10-CM | POA: Diagnosis not present

## 2022-10-19 ENCOUNTER — Encounter (HOSPITAL_COMMUNITY): Payer: Self-pay | Admitting: Gastroenterology

## 2022-11-10 ENCOUNTER — Telehealth: Payer: Self-pay | Admitting: *Deleted

## 2022-11-10 NOTE — Telephone Encounter (Signed)
Received VM from pt to call her. Called pt back and LMOVM to call back

## 2022-11-22 DIAGNOSIS — Z743 Need for continuous supervision: Secondary | ICD-10-CM | POA: Diagnosis not present

## 2022-11-22 DIAGNOSIS — Y900 Blood alcohol level of less than 20 mg/100 ml: Secondary | ICD-10-CM | POA: Diagnosis not present

## 2022-11-22 DIAGNOSIS — M810 Age-related osteoporosis without current pathological fracture: Secondary | ICD-10-CM | POA: Diagnosis not present

## 2022-11-22 DIAGNOSIS — J449 Chronic obstructive pulmonary disease, unspecified: Secondary | ICD-10-CM | POA: Diagnosis not present

## 2022-11-22 DIAGNOSIS — K219 Gastro-esophageal reflux disease without esophagitis: Secondary | ICD-10-CM | POA: Diagnosis not present

## 2022-11-22 DIAGNOSIS — Z9049 Acquired absence of other specified parts of digestive tract: Secondary | ICD-10-CM | POA: Diagnosis not present

## 2022-11-22 DIAGNOSIS — R531 Weakness: Secondary | ICD-10-CM | POA: Diagnosis not present

## 2022-11-22 DIAGNOSIS — Z681 Body mass index (BMI) 19 or less, adult: Secondary | ICD-10-CM | POA: Diagnosis not present

## 2022-11-22 DIAGNOSIS — I4510 Unspecified right bundle-branch block: Secondary | ICD-10-CM | POA: Diagnosis not present

## 2022-11-22 DIAGNOSIS — F10231 Alcohol dependence with withdrawal delirium: Secondary | ICD-10-CM | POA: Diagnosis not present

## 2022-11-22 DIAGNOSIS — Z880 Allergy status to penicillin: Secondary | ICD-10-CM | POA: Diagnosis not present

## 2022-11-22 DIAGNOSIS — F10239 Alcohol dependence with withdrawal, unspecified: Secondary | ICD-10-CM | POA: Diagnosis not present

## 2022-11-22 DIAGNOSIS — F17210 Nicotine dependence, cigarettes, uncomplicated: Secondary | ICD-10-CM | POA: Diagnosis not present

## 2022-11-22 DIAGNOSIS — R410 Disorientation, unspecified: Secondary | ICD-10-CM | POA: Diagnosis not present

## 2022-11-22 DIAGNOSIS — R4182 Altered mental status, unspecified: Secondary | ICD-10-CM | POA: Diagnosis not present

## 2022-11-22 DIAGNOSIS — Z7951 Long term (current) use of inhaled steroids: Secondary | ICD-10-CM | POA: Diagnosis not present

## 2022-11-22 DIAGNOSIS — Z79899 Other long term (current) drug therapy: Secondary | ICD-10-CM | POA: Diagnosis not present

## 2022-11-22 DIAGNOSIS — R0602 Shortness of breath: Secondary | ICD-10-CM | POA: Diagnosis not present

## 2022-11-22 DIAGNOSIS — R69 Illness, unspecified: Secondary | ICD-10-CM | POA: Diagnosis not present

## 2022-11-22 DIAGNOSIS — E43 Unspecified severe protein-calorie malnutrition: Secondary | ICD-10-CM | POA: Diagnosis not present

## 2022-11-22 DIAGNOSIS — E876 Hypokalemia: Secondary | ICD-10-CM | POA: Diagnosis not present

## 2022-11-22 DIAGNOSIS — R404 Transient alteration of awareness: Secondary | ICD-10-CM | POA: Diagnosis not present

## 2022-11-22 DIAGNOSIS — M0600 Rheumatoid arthritis without rheumatoid factor, unspecified site: Secondary | ICD-10-CM | POA: Diagnosis not present

## 2022-11-22 DIAGNOSIS — F419 Anxiety disorder, unspecified: Secondary | ICD-10-CM | POA: Diagnosis not present

## 2022-11-22 DIAGNOSIS — Z96643 Presence of artificial hip joint, bilateral: Secondary | ICD-10-CM | POA: Diagnosis not present

## 2022-11-23 ENCOUNTER — Telehealth (INDEPENDENT_AMBULATORY_CARE_PROVIDER_SITE_OTHER): Payer: Self-pay | Admitting: *Deleted

## 2022-11-23 ENCOUNTER — Encounter (HOSPITAL_COMMUNITY): Payer: Self-pay

## 2022-11-23 ENCOUNTER — Encounter (HOSPITAL_COMMUNITY)
Admission: RE | Admit: 2022-11-23 | Discharge: 2022-11-23 | Disposition: A | Discharge status: Home / Self Care | Payer: MEDICAID | Source: Ambulatory Visit | Attending: Gastroenterology | Admitting: Gastroenterology

## 2022-11-23 NOTE — Telephone Encounter (Signed)
Received message pt was no show for pre-op appt today. Procedure scheduled for Thursday. Looking in chart she is currently admitted at [facility] as of 11/22/22. Dr. Levon Hedger, should we see her back prior to rescheduling?

## 2022-11-23 NOTE — Telephone Encounter (Signed)
Hi Mindy, Yes, needs to be seen in the office Thanks

## 2022-11-23 NOTE — Pre-Procedure Instructions (Signed)
Attempted pre-op phonecall. Left VM for her to call us back. 

## 2022-11-23 NOTE — Patient Instructions (Signed)
Kristine Roberts  11/23/2022     @PREFPERIOPPHARMACY @   Your procedure is scheduled on  11/25/2022.   Report to 11/27/2022 at  1015  A.M.   Call this number if you have problems the morning of surgery:  [phone]  If you experience any cold or flu symptoms such as cough, fever, chills, shortness of breath, etc. between now and your scheduled surgery, please notify [phone] at the above number.   Remember:  Follow the diet instructions given to you by the office.        Use your inhaler and bring your rescue inhaler with you.    Take these medicines the morning of surgery with A SIP OF WATER        baclofen, famotidine, pantoprazole.      Do not wear jewelry, make-up or nail polish.  Do not wear lotions, powders, or perfumes, or deodorant.  Do not shave 48 hours prior to surgery.  Men may shave face and neck.  Do not bring valuables to the hospital.  [facility] is not responsible for any belongings or valuables.  Contacts, dentures or bridgework may not be worn into surgery.  Leave your suitcase in the car.  After surgery it may be brought to your room.  For patients admitted to the hospital, discharge time will be determined by your treatment team.  Patients discharged the day of surgery will not be allowed to drive home and must have someone with them for 24 hours.    Special instructions:   DO NOT smoke tobacco or vape for 24 hours before your procedure.  Please read over the following fact sheets that you were given. Anesthesia Post-op Instructions and Care and Recovery After Surgery      Upper Endoscopy, Adult, Care After After the procedure, it is common to have a sore throat. It is also common to have: Mild stomach pain or discomfort. Bloating. Nausea. Follow these instructions at home: The instructions below may help you care for yourself at home. Your health care provider may give you more instructions. If you have questions, ask your health care  provider. If you were given a sedative during the procedure, it can affect you for several hours. Do not drive or operate machinery until your health care provider says that it is safe. If you will be going home right after the procedure, plan to have a responsible adult: Take you home from the hospital or clinic. You will not be allowed to drive. Care for you for the time you are told. Follow instructions from your health care provider about what you may eat and drink. Return to your normal activities as told by your health care provider. Ask your health care provider what activities are safe for you. Take over-the-counter and prescription medicines only as told by your health care provider. Contact a health care provider if you: Have a sore throat that lasts longer than one day. Have trouble swallowing. Have a fever. Get help right away if you: Vomit blood or your vomit looks like coffee grounds. Have bloody, black, or tarry stools. Have a very bad sore throat or you cannot swallow. Have difficulty breathing or very bad pain in your chest or abdomen. These symptoms may be an emergency. Get help right away. Call 911. Do not wait to see if the symptoms will go away. Do not drive yourself to the hospital. Summary After the procedure, it is common to have a  sore throat, mild stomach discomfort, bloating, and nausea. If you were given a sedative during the procedure, it can affect you for several hours. Do not drive until your health care provider says that it is safe. Follow instructions from your health care provider about what you may eat and drink. Return to your normal activities as told by your health care provider. This information is not intended to replace advice given to you by your health care provider. Make sure you discuss any questions you have with your health care provider. Document Revised: 03/03/2022 Document Reviewed: 03/03/2022 Elsevier Patient Education  2023 Elsevier  Inc. Monitored Anesthesia Care, Care After The following information offers guidance on how to care for yourself after your procedure. Your health care provider may also give you more specific instructions. If you have problems or questions, contact your health care provider. What can I expect after the procedure? After the procedure, it is common to have: Tiredness. Little or no memory about what happened during or after the procedure. Impaired judgment when it comes to making decisions. Nausea or vomiting. Some trouble with balance. Follow these instructions at home: For the time period you were told by your health care provider:  Rest. Do not participate in activities where you could fall or become injured. Do not drive or use machinery. Do not drink alcohol. Do not take sleeping pills or medicines that cause drowsiness. Do not make important decisions or sign legal documents. Do not take care of children on your own. Medicines Take over-the-counter and prescription medicines only as told by your health care provider. If you were prescribed antibiotics, take them as told by your health care provider. Do not stop using the antibiotic even if you start to feel better. Eating and drinking Follow instructions from your health care provider about what you may eat and drink. Drink enough fluid to keep your urine pale yellow. If you vomit: Drink clear fluids slowly and in small amounts as you are able. Clear fluids include water, ice chips, low-calorie sports drinks, and fruit juice that has water added to it (diluted fruit juice). Eat light and bland foods in small amounts as you are able. These foods include bananas, applesauce, rice, lean meats, toast, and crackers. General instructions  Have a responsible adult stay with you for the time you are told. It is important to have someone help care for you until you are awake and alert. If you have sleep apnea, surgery and some medicines  can increase your risk for breathing problems. Follow instructions from your health care provider about wearing your sleep device: When you are sleeping. This includes during daytime naps. While taking prescription pain medicines, sleeping medicines, or medicines that make you drowsy. Do not use any products that contain nicotine or tobacco. These products include cigarettes, chewing tobacco, and vaping devices, such as e-cigarettes. If you need help quitting, ask your health care provider. Contact a health care provider if: You feel nauseous or vomit every time you eat or drink. You feel light-headed. You are still sleepy or having trouble with balance after 24 hours. You get a rash. You have a fever. You have redness or swelling around the IV site. Get help right away if: You have trouble breathing. You have new confusion after you get home. These symptoms may be an emergency. Get help right away. Call 911. Do not wait to see if the symptoms will go away. Do not drive yourself to the hospital. This information is not  intended to replace advice given to you by your health care provider. Make sure you discuss any questions you have with your health care provider. Document Revised: 04/19/2022 Document Reviewed: 04/19/2022 Elsevier Patient Education  2023 [employer].

## 2022-11-25 ENCOUNTER — Ambulatory Visit (HOSPITAL_COMMUNITY): Admission: RE | Admit: 2022-11-25 | Payer: Medicare HMO | Source: Ambulatory Visit | Admitting: Gastroenterology

## 2022-11-25 ENCOUNTER — Encounter (HOSPITAL_COMMUNITY): Admission: RE | Payer: Self-pay | Source: Ambulatory Visit

## 2022-11-25 DIAGNOSIS — E876 Hypokalemia: Secondary | ICD-10-CM | POA: Diagnosis not present

## 2022-11-25 DIAGNOSIS — R69 Illness, unspecified: Secondary | ICD-10-CM | POA: Diagnosis not present

## 2022-11-25 DIAGNOSIS — E43 Unspecified severe protein-calorie malnutrition: Secondary | ICD-10-CM | POA: Diagnosis not present

## 2022-11-25 SURGERY — ESOPHAGOGASTRODUODENOSCOPY (EGD) WITH PROPOFOL
Anesthesia: Monitor Anesthesia Care

## 2022-11-25 NOTE — Telephone Encounter (Signed)
Can you please schedule appointment

## 2022-12-01 DIAGNOSIS — Z743 Need for continuous supervision: Secondary | ICD-10-CM | POA: Diagnosis not present

## 2022-12-01 DIAGNOSIS — R262 Difficulty in walking, not elsewhere classified: Secondary | ICD-10-CM | POA: Diagnosis not present

## 2022-12-01 DIAGNOSIS — R52 Pain, unspecified: Secondary | ICD-10-CM | POA: Diagnosis not present

## 2022-12-01 DIAGNOSIS — R059 Cough, unspecified: Secondary | ICD-10-CM | POA: Diagnosis not present

## 2022-12-01 DIAGNOSIS — Z79899 Other long term (current) drug therapy: Secondary | ICD-10-CM | POA: Diagnosis not present

## 2022-12-01 DIAGNOSIS — Z880 Allergy status to penicillin: Secondary | ICD-10-CM | POA: Diagnosis not present

## 2022-12-01 DIAGNOSIS — R531 Weakness: Secondary | ICD-10-CM | POA: Diagnosis not present

## 2022-12-01 DIAGNOSIS — Z20822 Contact with and (suspected) exposure to covid-19: Secondary | ICD-10-CM | POA: Diagnosis not present

## 2022-12-01 DIAGNOSIS — M810 Age-related osteoporosis without current pathological fracture: Secondary | ICD-10-CM | POA: Diagnosis not present

## 2022-12-01 DIAGNOSIS — Z7951 Long term (current) use of inhaled steroids: Secondary | ICD-10-CM | POA: Diagnosis not present

## 2022-12-01 DIAGNOSIS — R0981 Nasal congestion: Secondary | ICD-10-CM | POA: Diagnosis not present

## 2022-12-01 DIAGNOSIS — J441 Chronic obstructive pulmonary disease with (acute) exacerbation: Secondary | ICD-10-CM | POA: Diagnosis not present

## 2022-12-01 DIAGNOSIS — J449 Chronic obstructive pulmonary disease, unspecified: Secondary | ICD-10-CM | POA: Diagnosis not present

## 2022-12-01 DIAGNOSIS — J439 Emphysema, unspecified: Secondary | ICD-10-CM | POA: Diagnosis not present

## 2022-12-01 DIAGNOSIS — K219 Gastro-esophageal reflux disease without esophagitis: Secondary | ICD-10-CM | POA: Diagnosis not present

## 2022-12-01 DIAGNOSIS — R69 Illness, unspecified: Secondary | ICD-10-CM | POA: Diagnosis not present

## 2022-12-03 DIAGNOSIS — Z299 Encounter for prophylactic measures, unspecified: Secondary | ICD-10-CM | POA: Diagnosis not present

## 2022-12-03 DIAGNOSIS — Z681 Body mass index (BMI) 19 or less, adult: Secondary | ICD-10-CM | POA: Diagnosis not present

## 2022-12-03 DIAGNOSIS — R69 Illness, unspecified: Secondary | ICD-10-CM | POA: Diagnosis not present

## 2022-12-03 DIAGNOSIS — F17210 Nicotine dependence, cigarettes, uncomplicated: Secondary | ICD-10-CM | POA: Diagnosis not present

## 2022-12-03 DIAGNOSIS — M06049 Rheumatoid arthritis without rheumatoid factor, unspecified hand: Secondary | ICD-10-CM | POA: Diagnosis not present

## 2022-12-27 DIAGNOSIS — R609 Edema, unspecified: Secondary | ICD-10-CM | POA: Diagnosis not present

## 2022-12-27 DIAGNOSIS — I700 Atherosclerosis of aorta: Secondary | ICD-10-CM | POA: Diagnosis not present

## 2022-12-27 DIAGNOSIS — Z299 Encounter for prophylactic measures, unspecified: Secondary | ICD-10-CM | POA: Diagnosis not present

## 2022-12-27 DIAGNOSIS — L0390 Cellulitis, unspecified: Secondary | ICD-10-CM | POA: Diagnosis not present

## 2022-12-27 DIAGNOSIS — L0291 Cutaneous abscess, unspecified: Secondary | ICD-10-CM | POA: Diagnosis not present

## 2023-01-03 DIAGNOSIS — L0291 Cutaneous abscess, unspecified: Secondary | ICD-10-CM | POA: Diagnosis not present

## 2023-01-03 DIAGNOSIS — R69 Illness, unspecified: Secondary | ICD-10-CM | POA: Diagnosis not present

## 2023-01-03 DIAGNOSIS — J449 Chronic obstructive pulmonary disease, unspecified: Secondary | ICD-10-CM | POA: Diagnosis not present

## 2023-01-03 DIAGNOSIS — Z299 Encounter for prophylactic measures, unspecified: Secondary | ICD-10-CM | POA: Diagnosis not present

## 2023-01-03 DIAGNOSIS — L0390 Cellulitis, unspecified: Secondary | ICD-10-CM | POA: Diagnosis not present

## 2023-01-04 DIAGNOSIS — Z681 Body mass index (BMI) 19 or less, adult: Secondary | ICD-10-CM | POA: Diagnosis not present

## 2023-01-04 DIAGNOSIS — J449 Chronic obstructive pulmonary disease, unspecified: Secondary | ICD-10-CM | POA: Diagnosis not present

## 2023-01-04 DIAGNOSIS — R69 Illness, unspecified: Secondary | ICD-10-CM | POA: Diagnosis not present

## 2023-01-04 DIAGNOSIS — R5383 Other fatigue: Secondary | ICD-10-CM | POA: Diagnosis not present

## 2023-01-04 DIAGNOSIS — F17210 Nicotine dependence, cigarettes, uncomplicated: Secondary | ICD-10-CM | POA: Diagnosis not present

## 2023-01-04 DIAGNOSIS — Z299 Encounter for prophylactic measures, unspecified: Secondary | ICD-10-CM | POA: Diagnosis not present

## 2023-01-05 DIAGNOSIS — L97212 Non-pressure chronic ulcer of right calf with fat layer exposed: Secondary | ICD-10-CM | POA: Diagnosis not present

## 2023-01-05 DIAGNOSIS — L97222 Non-pressure chronic ulcer of left calf with fat layer exposed: Secondary | ICD-10-CM | POA: Diagnosis not present

## 2023-01-05 DIAGNOSIS — I83022 Varicose veins of left lower extremity with ulcer of calf: Secondary | ICD-10-CM | POA: Diagnosis not present

## 2023-01-05 DIAGNOSIS — I83012 Varicose veins of right lower extremity with ulcer of calf: Secondary | ICD-10-CM | POA: Diagnosis not present

## 2023-01-06 DIAGNOSIS — L97202 Non-pressure chronic ulcer of unspecified calf with fat layer exposed: Secondary | ICD-10-CM | POA: Diagnosis not present

## 2023-01-06 DIAGNOSIS — I83002 Varicose veins of unspecified lower extremity with ulcer of calf: Secondary | ICD-10-CM | POA: Diagnosis not present

## 2023-01-07 DIAGNOSIS — L97229 Non-pressure chronic ulcer of left calf with unspecified severity: Secondary | ICD-10-CM | POA: Diagnosis not present

## 2023-01-07 DIAGNOSIS — I83018 Varicose veins of right lower extremity with ulcer other part of lower leg: Secondary | ICD-10-CM | POA: Diagnosis not present

## 2023-01-07 DIAGNOSIS — L97929 Non-pressure chronic ulcer of unspecified part of left lower leg with unspecified severity: Secondary | ICD-10-CM | POA: Diagnosis not present

## 2023-01-07 DIAGNOSIS — J449 Chronic obstructive pulmonary disease, unspecified: Secondary | ICD-10-CM | POA: Diagnosis not present

## 2023-01-07 DIAGNOSIS — R69 Illness, unspecified: Secondary | ICD-10-CM | POA: Diagnosis not present

## 2023-01-07 DIAGNOSIS — I872 Venous insufficiency (chronic) (peripheral): Secondary | ICD-10-CM | POA: Diagnosis not present

## 2023-01-07 DIAGNOSIS — Z888 Allergy status to other drugs, medicaments and biological substances status: Secondary | ICD-10-CM | POA: Diagnosis not present

## 2023-01-07 DIAGNOSIS — M810 Age-related osteoporosis without current pathological fracture: Secondary | ICD-10-CM | POA: Diagnosis not present

## 2023-01-07 DIAGNOSIS — K219 Gastro-esophageal reflux disease without esophagitis: Secondary | ICD-10-CM | POA: Diagnosis not present

## 2023-01-07 DIAGNOSIS — I83022 Varicose veins of left lower extremity with ulcer of calf: Secondary | ICD-10-CM | POA: Diagnosis not present

## 2023-01-07 DIAGNOSIS — M0600 Rheumatoid arthritis without rheumatoid factor, unspecified site: Secondary | ICD-10-CM | POA: Diagnosis not present

## 2023-01-07 DIAGNOSIS — L97919 Non-pressure chronic ulcer of unspecified part of right lower leg with unspecified severity: Secondary | ICD-10-CM | POA: Diagnosis not present

## 2023-01-07 DIAGNOSIS — I83028 Varicose veins of left lower extremity with ulcer other part of lower leg: Secondary | ICD-10-CM | POA: Diagnosis not present

## 2023-01-07 DIAGNOSIS — Z7951 Long term (current) use of inhaled steroids: Secondary | ICD-10-CM | POA: Diagnosis not present

## 2023-01-07 DIAGNOSIS — L97212 Non-pressure chronic ulcer of right calf with fat layer exposed: Secondary | ICD-10-CM | POA: Diagnosis not present

## 2023-01-07 DIAGNOSIS — Z880 Allergy status to penicillin: Secondary | ICD-10-CM | POA: Diagnosis not present

## 2023-01-07 DIAGNOSIS — I83012 Varicose veins of right lower extremity with ulcer of calf: Secondary | ICD-10-CM | POA: Diagnosis not present

## 2023-01-07 DIAGNOSIS — Z79899 Other long term (current) drug therapy: Secondary | ICD-10-CM | POA: Diagnosis not present

## 2023-01-08 DIAGNOSIS — Z4800 Encounter for change or removal of nonsurgical wound dressing: Secondary | ICD-10-CM | POA: Diagnosis not present

## 2023-01-09 DIAGNOSIS — Z4800 Encounter for change or removal of nonsurgical wound dressing: Secondary | ICD-10-CM | POA: Diagnosis not present

## 2023-01-10 DIAGNOSIS — I10 Essential (primary) hypertension: Secondary | ICD-10-CM | POA: Diagnosis not present

## 2023-01-10 DIAGNOSIS — F502 Bulimia nervosa: Secondary | ICD-10-CM | POA: Diagnosis not present

## 2023-01-10 DIAGNOSIS — F32A Depression, unspecified: Secondary | ICD-10-CM | POA: Diagnosis not present

## 2023-01-10 DIAGNOSIS — I83012 Varicose veins of right lower extremity with ulcer of calf: Secondary | ICD-10-CM | POA: Diagnosis not present

## 2023-01-10 DIAGNOSIS — M810 Age-related osteoporosis without current pathological fracture: Secondary | ICD-10-CM | POA: Diagnosis not present

## 2023-01-10 DIAGNOSIS — R69 Illness, unspecified: Secondary | ICD-10-CM | POA: Diagnosis not present

## 2023-01-10 DIAGNOSIS — E876 Hypokalemia: Secondary | ICD-10-CM | POA: Diagnosis not present

## 2023-01-10 DIAGNOSIS — Z791 Long term (current) use of non-steroidal anti-inflammatories (NSAID): Secondary | ICD-10-CM | POA: Diagnosis not present

## 2023-01-10 DIAGNOSIS — I83022 Varicose veins of left lower extremity with ulcer of calf: Secondary | ICD-10-CM | POA: Diagnosis not present

## 2023-01-10 DIAGNOSIS — J449 Chronic obstructive pulmonary disease, unspecified: Secondary | ICD-10-CM | POA: Diagnosis not present

## 2023-01-10 DIAGNOSIS — E860 Dehydration: Secondary | ICD-10-CM | POA: Diagnosis not present

## 2023-01-10 DIAGNOSIS — E43 Unspecified severe protein-calorie malnutrition: Secondary | ICD-10-CM | POA: Diagnosis not present

## 2023-01-10 DIAGNOSIS — M0600 Rheumatoid arthritis without rheumatoid factor, unspecified site: Secondary | ICD-10-CM | POA: Diagnosis not present

## 2023-01-10 DIAGNOSIS — L97222 Non-pressure chronic ulcer of left calf with fat layer exposed: Secondary | ICD-10-CM | POA: Diagnosis not present

## 2023-01-10 DIAGNOSIS — L97212 Non-pressure chronic ulcer of right calf with fat layer exposed: Secondary | ICD-10-CM | POA: Diagnosis not present

## 2023-01-12 DIAGNOSIS — I83022 Varicose veins of left lower extremity with ulcer of calf: Secondary | ICD-10-CM | POA: Diagnosis not present

## 2023-01-12 DIAGNOSIS — F502 Bulimia nervosa: Secondary | ICD-10-CM | POA: Diagnosis not present

## 2023-01-12 DIAGNOSIS — L97222 Non-pressure chronic ulcer of left calf with fat layer exposed: Secondary | ICD-10-CM | POA: Diagnosis not present

## 2023-01-12 DIAGNOSIS — Z791 Long term (current) use of non-steroidal anti-inflammatories (NSAID): Secondary | ICD-10-CM | POA: Diagnosis not present

## 2023-01-12 DIAGNOSIS — M0600 Rheumatoid arthritis without rheumatoid factor, unspecified site: Secondary | ICD-10-CM | POA: Diagnosis not present

## 2023-01-12 DIAGNOSIS — M810 Age-related osteoporosis without current pathological fracture: Secondary | ICD-10-CM | POA: Diagnosis not present

## 2023-01-12 DIAGNOSIS — E860 Dehydration: Secondary | ICD-10-CM | POA: Diagnosis not present

## 2023-01-12 DIAGNOSIS — R69 Illness, unspecified: Secondary | ICD-10-CM | POA: Diagnosis not present

## 2023-01-12 DIAGNOSIS — L97212 Non-pressure chronic ulcer of right calf with fat layer exposed: Secondary | ICD-10-CM | POA: Diagnosis not present

## 2023-01-12 DIAGNOSIS — E876 Hypokalemia: Secondary | ICD-10-CM | POA: Diagnosis not present

## 2023-01-12 DIAGNOSIS — I83012 Varicose veins of right lower extremity with ulcer of calf: Secondary | ICD-10-CM | POA: Diagnosis not present

## 2023-01-12 DIAGNOSIS — E43 Unspecified severe protein-calorie malnutrition: Secondary | ICD-10-CM | POA: Diagnosis not present

## 2023-01-12 DIAGNOSIS — F32A Depression, unspecified: Secondary | ICD-10-CM | POA: Diagnosis not present

## 2023-01-12 DIAGNOSIS — J449 Chronic obstructive pulmonary disease, unspecified: Secondary | ICD-10-CM | POA: Diagnosis not present

## 2023-01-14 DIAGNOSIS — E876 Hypokalemia: Secondary | ICD-10-CM | POA: Diagnosis not present

## 2023-01-14 DIAGNOSIS — I83012 Varicose veins of right lower extremity with ulcer of calf: Secondary | ICD-10-CM | POA: Diagnosis not present

## 2023-01-14 DIAGNOSIS — F32A Depression, unspecified: Secondary | ICD-10-CM | POA: Diagnosis not present

## 2023-01-14 DIAGNOSIS — L97202 Non-pressure chronic ulcer of unspecified calf with fat layer exposed: Secondary | ICD-10-CM | POA: Diagnosis not present

## 2023-01-14 DIAGNOSIS — R600 Localized edema: Secondary | ICD-10-CM | POA: Diagnosis not present

## 2023-01-14 DIAGNOSIS — R69 Illness, unspecified: Secondary | ICD-10-CM | POA: Diagnosis not present

## 2023-01-14 DIAGNOSIS — E43 Unspecified severe protein-calorie malnutrition: Secondary | ICD-10-CM | POA: Diagnosis not present

## 2023-01-14 DIAGNOSIS — Z791 Long term (current) use of non-steroidal anti-inflammatories (NSAID): Secondary | ICD-10-CM | POA: Diagnosis not present

## 2023-01-14 DIAGNOSIS — I83002 Varicose veins of unspecified lower extremity with ulcer of calf: Secondary | ICD-10-CM | POA: Diagnosis not present

## 2023-01-14 DIAGNOSIS — L97212 Non-pressure chronic ulcer of right calf with fat layer exposed: Secondary | ICD-10-CM | POA: Diagnosis not present

## 2023-01-14 DIAGNOSIS — F502 Bulimia nervosa: Secondary | ICD-10-CM | POA: Diagnosis not present

## 2023-01-14 DIAGNOSIS — M0600 Rheumatoid arthritis without rheumatoid factor, unspecified site: Secondary | ICD-10-CM | POA: Diagnosis not present

## 2023-01-14 DIAGNOSIS — E860 Dehydration: Secondary | ICD-10-CM | POA: Diagnosis not present

## 2023-01-14 DIAGNOSIS — M810 Age-related osteoporosis without current pathological fracture: Secondary | ICD-10-CM | POA: Diagnosis not present

## 2023-01-14 DIAGNOSIS — L97222 Non-pressure chronic ulcer of left calf with fat layer exposed: Secondary | ICD-10-CM | POA: Diagnosis not present

## 2023-01-14 DIAGNOSIS — I83022 Varicose veins of left lower extremity with ulcer of calf: Secondary | ICD-10-CM | POA: Diagnosis not present

## 2023-01-14 DIAGNOSIS — J449 Chronic obstructive pulmonary disease, unspecified: Secondary | ICD-10-CM | POA: Diagnosis not present

## 2023-01-17 DIAGNOSIS — L97202 Non-pressure chronic ulcer of unspecified calf with fat layer exposed: Secondary | ICD-10-CM | POA: Diagnosis not present

## 2023-01-17 DIAGNOSIS — R69 Illness, unspecified: Secondary | ICD-10-CM | POA: Diagnosis not present

## 2023-01-17 DIAGNOSIS — I83022 Varicose veins of left lower extremity with ulcer of calf: Secondary | ICD-10-CM | POA: Diagnosis not present

## 2023-01-17 DIAGNOSIS — L97222 Non-pressure chronic ulcer of left calf with fat layer exposed: Secondary | ICD-10-CM | POA: Diagnosis not present

## 2023-01-17 DIAGNOSIS — Z791 Long term (current) use of non-steroidal anti-inflammatories (NSAID): Secondary | ICD-10-CM | POA: Diagnosis not present

## 2023-01-17 DIAGNOSIS — E876 Hypokalemia: Secondary | ICD-10-CM | POA: Diagnosis not present

## 2023-01-17 DIAGNOSIS — F32A Depression, unspecified: Secondary | ICD-10-CM | POA: Diagnosis not present

## 2023-01-17 DIAGNOSIS — I83002 Varicose veins of unspecified lower extremity with ulcer of calf: Secondary | ICD-10-CM | POA: Diagnosis not present

## 2023-01-17 DIAGNOSIS — L97212 Non-pressure chronic ulcer of right calf with fat layer exposed: Secondary | ICD-10-CM | POA: Diagnosis not present

## 2023-01-17 DIAGNOSIS — L97909 Non-pressure chronic ulcer of unspecified part of unspecified lower leg with unspecified severity: Secondary | ICD-10-CM | POA: Diagnosis not present

## 2023-01-17 DIAGNOSIS — I83012 Varicose veins of right lower extremity with ulcer of calf: Secondary | ICD-10-CM | POA: Diagnosis not present

## 2023-01-17 DIAGNOSIS — J449 Chronic obstructive pulmonary disease, unspecified: Secondary | ICD-10-CM | POA: Diagnosis not present

## 2023-01-17 DIAGNOSIS — M0600 Rheumatoid arthritis without rheumatoid factor, unspecified site: Secondary | ICD-10-CM | POA: Diagnosis not present

## 2023-01-17 DIAGNOSIS — F502 Bulimia nervosa: Secondary | ICD-10-CM | POA: Diagnosis not present

## 2023-01-17 DIAGNOSIS — E860 Dehydration: Secondary | ICD-10-CM | POA: Diagnosis not present

## 2023-01-17 DIAGNOSIS — E43 Unspecified severe protein-calorie malnutrition: Secondary | ICD-10-CM | POA: Diagnosis not present

## 2023-01-17 DIAGNOSIS — M810 Age-related osteoporosis without current pathological fracture: Secondary | ICD-10-CM | POA: Diagnosis not present

## 2023-01-18 DIAGNOSIS — J449 Chronic obstructive pulmonary disease, unspecified: Secondary | ICD-10-CM | POA: Diagnosis not present

## 2023-01-18 DIAGNOSIS — Z880 Allergy status to penicillin: Secondary | ICD-10-CM | POA: Diagnosis not present

## 2023-01-18 DIAGNOSIS — K219 Gastro-esophageal reflux disease without esophagitis: Secondary | ICD-10-CM | POA: Diagnosis not present

## 2023-01-18 DIAGNOSIS — Z96643 Presence of artificial hip joint, bilateral: Secondary | ICD-10-CM | POA: Diagnosis not present

## 2023-01-18 DIAGNOSIS — T148XXA Other injury of unspecified body region, initial encounter: Secondary | ICD-10-CM | POA: Diagnosis not present

## 2023-01-18 DIAGNOSIS — R69 Illness, unspecified: Secondary | ICD-10-CM | POA: Diagnosis not present

## 2023-01-18 DIAGNOSIS — L97212 Non-pressure chronic ulcer of right calf with fat layer exposed: Secondary | ICD-10-CM | POA: Diagnosis not present

## 2023-01-18 DIAGNOSIS — Z7951 Long term (current) use of inhaled steroids: Secondary | ICD-10-CM | POA: Diagnosis not present

## 2023-01-18 DIAGNOSIS — Z9049 Acquired absence of other specified parts of digestive tract: Secondary | ICD-10-CM | POA: Diagnosis not present

## 2023-01-18 DIAGNOSIS — I83022 Varicose veins of left lower extremity with ulcer of calf: Secondary | ICD-10-CM | POA: Diagnosis not present

## 2023-01-18 DIAGNOSIS — L97222 Non-pressure chronic ulcer of left calf with fat layer exposed: Secondary | ICD-10-CM | POA: Diagnosis not present

## 2023-01-18 DIAGNOSIS — F17210 Nicotine dependence, cigarettes, uncomplicated: Secondary | ICD-10-CM | POA: Diagnosis not present

## 2023-01-18 DIAGNOSIS — M0600 Rheumatoid arthritis without rheumatoid factor, unspecified site: Secondary | ICD-10-CM | POA: Diagnosis not present

## 2023-01-18 DIAGNOSIS — I83012 Varicose veins of right lower extremity with ulcer of calf: Secondary | ICD-10-CM | POA: Diagnosis not present

## 2023-01-18 DIAGNOSIS — T8189XA Other complications of procedures, not elsewhere classified, initial encounter: Secondary | ICD-10-CM | POA: Diagnosis not present

## 2023-01-18 DIAGNOSIS — Y838 Other surgical procedures as the cause of abnormal reaction of the patient, or of later complication, without mention of misadventure at the time of the procedure: Secondary | ICD-10-CM | POA: Diagnosis not present

## 2023-01-19 DIAGNOSIS — R69 Illness, unspecified: Secondary | ICD-10-CM | POA: Diagnosis not present

## 2023-01-19 DIAGNOSIS — I83022 Varicose veins of left lower extremity with ulcer of calf: Secondary | ICD-10-CM | POA: Diagnosis not present

## 2023-01-19 DIAGNOSIS — I83012 Varicose veins of right lower extremity with ulcer of calf: Secondary | ICD-10-CM | POA: Diagnosis not present

## 2023-01-19 DIAGNOSIS — E860 Dehydration: Secondary | ICD-10-CM | POA: Diagnosis not present

## 2023-01-19 DIAGNOSIS — E43 Unspecified severe protein-calorie malnutrition: Secondary | ICD-10-CM | POA: Diagnosis not present

## 2023-01-19 DIAGNOSIS — L97212 Non-pressure chronic ulcer of right calf with fat layer exposed: Secondary | ICD-10-CM | POA: Diagnosis not present

## 2023-01-19 DIAGNOSIS — M810 Age-related osteoporosis without current pathological fracture: Secondary | ICD-10-CM | POA: Diagnosis not present

## 2023-01-19 DIAGNOSIS — F502 Bulimia nervosa: Secondary | ICD-10-CM | POA: Diagnosis not present

## 2023-01-19 DIAGNOSIS — F32A Depression, unspecified: Secondary | ICD-10-CM | POA: Diagnosis not present

## 2023-01-19 DIAGNOSIS — Z791 Long term (current) use of non-steroidal anti-inflammatories (NSAID): Secondary | ICD-10-CM | POA: Diagnosis not present

## 2023-01-19 DIAGNOSIS — L97222 Non-pressure chronic ulcer of left calf with fat layer exposed: Secondary | ICD-10-CM | POA: Diagnosis not present

## 2023-01-19 DIAGNOSIS — M0600 Rheumatoid arthritis without rheumatoid factor, unspecified site: Secondary | ICD-10-CM | POA: Diagnosis not present

## 2023-01-19 DIAGNOSIS — E876 Hypokalemia: Secondary | ICD-10-CM | POA: Diagnosis not present

## 2023-01-19 DIAGNOSIS — J449 Chronic obstructive pulmonary disease, unspecified: Secondary | ICD-10-CM | POA: Diagnosis not present

## 2023-01-21 DIAGNOSIS — M0600 Rheumatoid arthritis without rheumatoid factor, unspecified site: Secondary | ICD-10-CM | POA: Diagnosis not present

## 2023-01-21 DIAGNOSIS — Z791 Long term (current) use of non-steroidal anti-inflammatories (NSAID): Secondary | ICD-10-CM | POA: Diagnosis not present

## 2023-01-21 DIAGNOSIS — F502 Bulimia nervosa: Secondary | ICD-10-CM | POA: Diagnosis not present

## 2023-01-21 DIAGNOSIS — F32A Depression, unspecified: Secondary | ICD-10-CM | POA: Diagnosis not present

## 2023-01-21 DIAGNOSIS — I83012 Varicose veins of right lower extremity with ulcer of calf: Secondary | ICD-10-CM | POA: Diagnosis not present

## 2023-01-21 DIAGNOSIS — L97212 Non-pressure chronic ulcer of right calf with fat layer exposed: Secondary | ICD-10-CM | POA: Diagnosis not present

## 2023-01-21 DIAGNOSIS — E860 Dehydration: Secondary | ICD-10-CM | POA: Diagnosis not present

## 2023-01-21 DIAGNOSIS — E43 Unspecified severe protein-calorie malnutrition: Secondary | ICD-10-CM | POA: Diagnosis not present

## 2023-01-21 DIAGNOSIS — L97222 Non-pressure chronic ulcer of left calf with fat layer exposed: Secondary | ICD-10-CM | POA: Diagnosis not present

## 2023-01-21 DIAGNOSIS — M810 Age-related osteoporosis without current pathological fracture: Secondary | ICD-10-CM | POA: Diagnosis not present

## 2023-01-21 DIAGNOSIS — J449 Chronic obstructive pulmonary disease, unspecified: Secondary | ICD-10-CM | POA: Diagnosis not present

## 2023-01-21 DIAGNOSIS — E876 Hypokalemia: Secondary | ICD-10-CM | POA: Diagnosis not present

## 2023-01-21 DIAGNOSIS — I83022 Varicose veins of left lower extremity with ulcer of calf: Secondary | ICD-10-CM | POA: Diagnosis not present

## 2023-01-21 DIAGNOSIS — R69 Illness, unspecified: Secondary | ICD-10-CM | POA: Diagnosis not present

## 2023-01-24 DIAGNOSIS — L97222 Non-pressure chronic ulcer of left calf with fat layer exposed: Secondary | ICD-10-CM | POA: Diagnosis not present

## 2023-01-24 DIAGNOSIS — I83022 Varicose veins of left lower extremity with ulcer of calf: Secondary | ICD-10-CM | POA: Diagnosis not present

## 2023-01-24 DIAGNOSIS — E876 Hypokalemia: Secondary | ICD-10-CM | POA: Diagnosis not present

## 2023-01-24 DIAGNOSIS — M810 Age-related osteoporosis without current pathological fracture: Secondary | ICD-10-CM | POA: Diagnosis not present

## 2023-01-24 DIAGNOSIS — L97212 Non-pressure chronic ulcer of right calf with fat layer exposed: Secondary | ICD-10-CM | POA: Diagnosis not present

## 2023-01-24 DIAGNOSIS — F32A Depression, unspecified: Secondary | ICD-10-CM | POA: Diagnosis not present

## 2023-01-24 DIAGNOSIS — I83012 Varicose veins of right lower extremity with ulcer of calf: Secondary | ICD-10-CM | POA: Diagnosis not present

## 2023-01-24 DIAGNOSIS — F502 Bulimia nervosa: Secondary | ICD-10-CM | POA: Diagnosis not present

## 2023-01-24 DIAGNOSIS — Z791 Long term (current) use of non-steroidal anti-inflammatories (NSAID): Secondary | ICD-10-CM | POA: Diagnosis not present

## 2023-01-24 DIAGNOSIS — M0600 Rheumatoid arthritis without rheumatoid factor, unspecified site: Secondary | ICD-10-CM | POA: Diagnosis not present

## 2023-01-24 DIAGNOSIS — J449 Chronic obstructive pulmonary disease, unspecified: Secondary | ICD-10-CM | POA: Diagnosis not present

## 2023-01-24 DIAGNOSIS — E43 Unspecified severe protein-calorie malnutrition: Secondary | ICD-10-CM | POA: Diagnosis not present

## 2023-01-24 DIAGNOSIS — E860 Dehydration: Secondary | ICD-10-CM | POA: Diagnosis not present

## 2023-01-24 DIAGNOSIS — R69 Illness, unspecified: Secondary | ICD-10-CM | POA: Diagnosis not present

## 2023-01-26 DIAGNOSIS — M810 Age-related osteoporosis without current pathological fracture: Secondary | ICD-10-CM | POA: Diagnosis not present

## 2023-01-26 DIAGNOSIS — E876 Hypokalemia: Secondary | ICD-10-CM | POA: Diagnosis not present

## 2023-01-26 DIAGNOSIS — E43 Unspecified severe protein-calorie malnutrition: Secondary | ICD-10-CM | POA: Diagnosis not present

## 2023-01-26 DIAGNOSIS — R69 Illness, unspecified: Secondary | ICD-10-CM | POA: Diagnosis not present

## 2023-01-26 DIAGNOSIS — J449 Chronic obstructive pulmonary disease, unspecified: Secondary | ICD-10-CM | POA: Diagnosis not present

## 2023-01-26 DIAGNOSIS — L97222 Non-pressure chronic ulcer of left calf with fat layer exposed: Secondary | ICD-10-CM | POA: Diagnosis not present

## 2023-01-26 DIAGNOSIS — Z791 Long term (current) use of non-steroidal anti-inflammatories (NSAID): Secondary | ICD-10-CM | POA: Diagnosis not present

## 2023-01-26 DIAGNOSIS — F502 Bulimia nervosa: Secondary | ICD-10-CM | POA: Diagnosis not present

## 2023-01-26 DIAGNOSIS — I83012 Varicose veins of right lower extremity with ulcer of calf: Secondary | ICD-10-CM | POA: Diagnosis not present

## 2023-01-26 DIAGNOSIS — E860 Dehydration: Secondary | ICD-10-CM | POA: Diagnosis not present

## 2023-01-26 DIAGNOSIS — F32A Depression, unspecified: Secondary | ICD-10-CM | POA: Diagnosis not present

## 2023-01-26 DIAGNOSIS — M0600 Rheumatoid arthritis without rheumatoid factor, unspecified site: Secondary | ICD-10-CM | POA: Diagnosis not present

## 2023-01-26 DIAGNOSIS — L97212 Non-pressure chronic ulcer of right calf with fat layer exposed: Secondary | ICD-10-CM | POA: Diagnosis not present

## 2023-01-26 DIAGNOSIS — I83022 Varicose veins of left lower extremity with ulcer of calf: Secondary | ICD-10-CM | POA: Diagnosis not present

## 2023-01-28 DIAGNOSIS — R69 Illness, unspecified: Secondary | ICD-10-CM | POA: Diagnosis not present

## 2023-01-28 DIAGNOSIS — F502 Bulimia nervosa: Secondary | ICD-10-CM | POA: Diagnosis not present

## 2023-01-28 DIAGNOSIS — E43 Unspecified severe protein-calorie malnutrition: Secondary | ICD-10-CM | POA: Diagnosis not present

## 2023-01-28 DIAGNOSIS — L97212 Non-pressure chronic ulcer of right calf with fat layer exposed: Secondary | ICD-10-CM | POA: Diagnosis not present

## 2023-01-28 DIAGNOSIS — Z791 Long term (current) use of non-steroidal anti-inflammatories (NSAID): Secondary | ICD-10-CM | POA: Diagnosis not present

## 2023-01-28 DIAGNOSIS — M0600 Rheumatoid arthritis without rheumatoid factor, unspecified site: Secondary | ICD-10-CM | POA: Diagnosis not present

## 2023-01-28 DIAGNOSIS — J449 Chronic obstructive pulmonary disease, unspecified: Secondary | ICD-10-CM | POA: Diagnosis not present

## 2023-01-28 DIAGNOSIS — L97222 Non-pressure chronic ulcer of left calf with fat layer exposed: Secondary | ICD-10-CM | POA: Diagnosis not present

## 2023-01-28 DIAGNOSIS — I83012 Varicose veins of right lower extremity with ulcer of calf: Secondary | ICD-10-CM | POA: Diagnosis not present

## 2023-01-28 DIAGNOSIS — F32A Depression, unspecified: Secondary | ICD-10-CM | POA: Diagnosis not present

## 2023-01-28 DIAGNOSIS — E876 Hypokalemia: Secondary | ICD-10-CM | POA: Diagnosis not present

## 2023-01-28 DIAGNOSIS — M810 Age-related osteoporosis without current pathological fracture: Secondary | ICD-10-CM | POA: Diagnosis not present

## 2023-01-28 DIAGNOSIS — I83022 Varicose veins of left lower extremity with ulcer of calf: Secondary | ICD-10-CM | POA: Diagnosis not present

## 2023-01-28 DIAGNOSIS — E860 Dehydration: Secondary | ICD-10-CM | POA: Diagnosis not present

## 2023-01-31 DIAGNOSIS — E860 Dehydration: Secondary | ICD-10-CM | POA: Diagnosis not present

## 2023-01-31 DIAGNOSIS — L97222 Non-pressure chronic ulcer of left calf with fat layer exposed: Secondary | ICD-10-CM | POA: Diagnosis not present

## 2023-01-31 DIAGNOSIS — I83012 Varicose veins of right lower extremity with ulcer of calf: Secondary | ICD-10-CM | POA: Diagnosis not present

## 2023-01-31 DIAGNOSIS — F32A Depression, unspecified: Secondary | ICD-10-CM | POA: Diagnosis not present

## 2023-01-31 DIAGNOSIS — M810 Age-related osteoporosis without current pathological fracture: Secondary | ICD-10-CM | POA: Diagnosis not present

## 2023-01-31 DIAGNOSIS — E43 Unspecified severe protein-calorie malnutrition: Secondary | ICD-10-CM | POA: Diagnosis not present

## 2023-01-31 DIAGNOSIS — I83022 Varicose veins of left lower extremity with ulcer of calf: Secondary | ICD-10-CM | POA: Diagnosis not present

## 2023-01-31 DIAGNOSIS — L97212 Non-pressure chronic ulcer of right calf with fat layer exposed: Secondary | ICD-10-CM | POA: Diagnosis not present

## 2023-01-31 DIAGNOSIS — J449 Chronic obstructive pulmonary disease, unspecified: Secondary | ICD-10-CM | POA: Diagnosis not present

## 2023-01-31 DIAGNOSIS — F502 Bulimia nervosa: Secondary | ICD-10-CM | POA: Diagnosis not present

## 2023-01-31 DIAGNOSIS — M0600 Rheumatoid arthritis without rheumatoid factor, unspecified site: Secondary | ICD-10-CM | POA: Diagnosis not present

## 2023-01-31 DIAGNOSIS — E876 Hypokalemia: Secondary | ICD-10-CM | POA: Diagnosis not present

## 2023-01-31 DIAGNOSIS — R69 Illness, unspecified: Secondary | ICD-10-CM | POA: Diagnosis not present

## 2023-01-31 DIAGNOSIS — Z791 Long term (current) use of non-steroidal anti-inflammatories (NSAID): Secondary | ICD-10-CM | POA: Diagnosis not present

## 2023-02-02 DIAGNOSIS — M0600 Rheumatoid arthritis without rheumatoid factor, unspecified site: Secondary | ICD-10-CM | POA: Diagnosis not present

## 2023-02-02 DIAGNOSIS — E860 Dehydration: Secondary | ICD-10-CM | POA: Diagnosis not present

## 2023-02-02 DIAGNOSIS — R69 Illness, unspecified: Secondary | ICD-10-CM | POA: Diagnosis not present

## 2023-02-02 DIAGNOSIS — E876 Hypokalemia: Secondary | ICD-10-CM | POA: Diagnosis not present

## 2023-02-02 DIAGNOSIS — E43 Unspecified severe protein-calorie malnutrition: Secondary | ICD-10-CM | POA: Diagnosis not present

## 2023-02-02 DIAGNOSIS — L97222 Non-pressure chronic ulcer of left calf with fat layer exposed: Secondary | ICD-10-CM | POA: Diagnosis not present

## 2023-02-02 DIAGNOSIS — F502 Bulimia nervosa: Secondary | ICD-10-CM | POA: Diagnosis not present

## 2023-02-02 DIAGNOSIS — M810 Age-related osteoporosis without current pathological fracture: Secondary | ICD-10-CM | POA: Diagnosis not present

## 2023-02-02 DIAGNOSIS — I83022 Varicose veins of left lower extremity with ulcer of calf: Secondary | ICD-10-CM | POA: Diagnosis not present

## 2023-02-02 DIAGNOSIS — I83012 Varicose veins of right lower extremity with ulcer of calf: Secondary | ICD-10-CM | POA: Diagnosis not present

## 2023-02-02 DIAGNOSIS — L97212 Non-pressure chronic ulcer of right calf with fat layer exposed: Secondary | ICD-10-CM | POA: Diagnosis not present

## 2023-02-02 DIAGNOSIS — F32A Depression, unspecified: Secondary | ICD-10-CM | POA: Diagnosis not present

## 2023-02-02 DIAGNOSIS — J449 Chronic obstructive pulmonary disease, unspecified: Secondary | ICD-10-CM | POA: Diagnosis not present

## 2023-02-02 DIAGNOSIS — Z791 Long term (current) use of non-steroidal anti-inflammatories (NSAID): Secondary | ICD-10-CM | POA: Diagnosis not present

## 2023-02-03 DIAGNOSIS — J449 Chronic obstructive pulmonary disease, unspecified: Secondary | ICD-10-CM | POA: Diagnosis not present

## 2023-02-03 DIAGNOSIS — R69 Illness, unspecified: Secondary | ICD-10-CM | POA: Diagnosis not present

## 2023-02-07 DIAGNOSIS — F32A Depression, unspecified: Secondary | ICD-10-CM | POA: Diagnosis not present

## 2023-02-07 DIAGNOSIS — E43 Unspecified severe protein-calorie malnutrition: Secondary | ICD-10-CM | POA: Diagnosis not present

## 2023-02-07 DIAGNOSIS — E860 Dehydration: Secondary | ICD-10-CM | POA: Diagnosis not present

## 2023-02-07 DIAGNOSIS — F502 Bulimia nervosa: Secondary | ICD-10-CM | POA: Diagnosis not present

## 2023-02-07 DIAGNOSIS — L97222 Non-pressure chronic ulcer of left calf with fat layer exposed: Secondary | ICD-10-CM | POA: Diagnosis not present

## 2023-02-07 DIAGNOSIS — M0600 Rheumatoid arthritis without rheumatoid factor, unspecified site: Secondary | ICD-10-CM | POA: Diagnosis not present

## 2023-02-07 DIAGNOSIS — I83012 Varicose veins of right lower extremity with ulcer of calf: Secondary | ICD-10-CM | POA: Diagnosis not present

## 2023-02-07 DIAGNOSIS — Z791 Long term (current) use of non-steroidal anti-inflammatories (NSAID): Secondary | ICD-10-CM | POA: Diagnosis not present

## 2023-02-07 DIAGNOSIS — M810 Age-related osteoporosis without current pathological fracture: Secondary | ICD-10-CM | POA: Diagnosis not present

## 2023-02-07 DIAGNOSIS — R69 Illness, unspecified: Secondary | ICD-10-CM | POA: Diagnosis not present

## 2023-02-07 DIAGNOSIS — E876 Hypokalemia: Secondary | ICD-10-CM | POA: Diagnosis not present

## 2023-02-07 DIAGNOSIS — J449 Chronic obstructive pulmonary disease, unspecified: Secondary | ICD-10-CM | POA: Diagnosis not present

## 2023-02-07 DIAGNOSIS — L97212 Non-pressure chronic ulcer of right calf with fat layer exposed: Secondary | ICD-10-CM | POA: Diagnosis not present

## 2023-02-07 DIAGNOSIS — I83022 Varicose veins of left lower extremity with ulcer of calf: Secondary | ICD-10-CM | POA: Diagnosis not present

## 2023-02-08 DIAGNOSIS — F17210 Nicotine dependence, cigarettes, uncomplicated: Secondary | ICD-10-CM | POA: Diagnosis not present

## 2023-02-08 DIAGNOSIS — Z880 Allergy status to penicillin: Secondary | ICD-10-CM | POA: Diagnosis not present

## 2023-02-08 DIAGNOSIS — M0600 Rheumatoid arthritis without rheumatoid factor, unspecified site: Secondary | ICD-10-CM | POA: Diagnosis not present

## 2023-02-08 DIAGNOSIS — Z9049 Acquired absence of other specified parts of digestive tract: Secondary | ICD-10-CM | POA: Diagnosis not present

## 2023-02-08 DIAGNOSIS — Z7951 Long term (current) use of inhaled steroids: Secondary | ICD-10-CM | POA: Diagnosis not present

## 2023-02-08 DIAGNOSIS — L97222 Non-pressure chronic ulcer of left calf with fat layer exposed: Secondary | ICD-10-CM | POA: Diagnosis not present

## 2023-02-08 DIAGNOSIS — J449 Chronic obstructive pulmonary disease, unspecified: Secondary | ICD-10-CM | POA: Diagnosis not present

## 2023-02-08 DIAGNOSIS — K219 Gastro-esophageal reflux disease without esophagitis: Secondary | ICD-10-CM | POA: Diagnosis not present

## 2023-02-08 DIAGNOSIS — Y838 Other surgical procedures as the cause of abnormal reaction of the patient, or of later complication, without mention of misadventure at the time of the procedure: Secondary | ICD-10-CM | POA: Diagnosis not present

## 2023-02-08 DIAGNOSIS — T148XXA Other injury of unspecified body region, initial encounter: Secondary | ICD-10-CM | POA: Diagnosis not present

## 2023-02-08 DIAGNOSIS — I83012 Varicose veins of right lower extremity with ulcer of calf: Secondary | ICD-10-CM | POA: Diagnosis not present

## 2023-02-08 DIAGNOSIS — L97212 Non-pressure chronic ulcer of right calf with fat layer exposed: Secondary | ICD-10-CM | POA: Diagnosis not present

## 2023-02-08 DIAGNOSIS — R69 Illness, unspecified: Secondary | ICD-10-CM | POA: Diagnosis not present

## 2023-02-08 DIAGNOSIS — Z96643 Presence of artificial hip joint, bilateral: Secondary | ICD-10-CM | POA: Diagnosis not present

## 2023-02-08 DIAGNOSIS — I83022 Varicose veins of left lower extremity with ulcer of calf: Secondary | ICD-10-CM | POA: Diagnosis not present

## 2023-02-08 DIAGNOSIS — T8189XA Other complications of procedures, not elsewhere classified, initial encounter: Secondary | ICD-10-CM | POA: Diagnosis not present

## 2023-02-09 DIAGNOSIS — F502 Bulimia nervosa: Secondary | ICD-10-CM | POA: Diagnosis not present

## 2023-02-09 DIAGNOSIS — I83012 Varicose veins of right lower extremity with ulcer of calf: Secondary | ICD-10-CM | POA: Diagnosis not present

## 2023-02-09 DIAGNOSIS — R69 Illness, unspecified: Secondary | ICD-10-CM | POA: Diagnosis not present

## 2023-02-09 DIAGNOSIS — Z791 Long term (current) use of non-steroidal anti-inflammatories (NSAID): Secondary | ICD-10-CM | POA: Diagnosis not present

## 2023-02-09 DIAGNOSIS — E876 Hypokalemia: Secondary | ICD-10-CM | POA: Diagnosis not present

## 2023-02-09 DIAGNOSIS — M0600 Rheumatoid arthritis without rheumatoid factor, unspecified site: Secondary | ICD-10-CM | POA: Diagnosis not present

## 2023-02-09 DIAGNOSIS — M810 Age-related osteoporosis without current pathological fracture: Secondary | ICD-10-CM | POA: Diagnosis not present

## 2023-02-09 DIAGNOSIS — L97222 Non-pressure chronic ulcer of left calf with fat layer exposed: Secondary | ICD-10-CM | POA: Diagnosis not present

## 2023-02-09 DIAGNOSIS — E43 Unspecified severe protein-calorie malnutrition: Secondary | ICD-10-CM | POA: Diagnosis not present

## 2023-02-09 DIAGNOSIS — J449 Chronic obstructive pulmonary disease, unspecified: Secondary | ICD-10-CM | POA: Diagnosis not present

## 2023-02-09 DIAGNOSIS — F32A Depression, unspecified: Secondary | ICD-10-CM | POA: Diagnosis not present

## 2023-02-09 DIAGNOSIS — I83022 Varicose veins of left lower extremity with ulcer of calf: Secondary | ICD-10-CM | POA: Diagnosis not present

## 2023-02-09 DIAGNOSIS — E860 Dehydration: Secondary | ICD-10-CM | POA: Diagnosis not present

## 2023-02-09 DIAGNOSIS — L97212 Non-pressure chronic ulcer of right calf with fat layer exposed: Secondary | ICD-10-CM | POA: Diagnosis not present

## 2023-02-11 DIAGNOSIS — I83022 Varicose veins of left lower extremity with ulcer of calf: Secondary | ICD-10-CM | POA: Diagnosis not present

## 2023-02-11 DIAGNOSIS — F32A Depression, unspecified: Secondary | ICD-10-CM | POA: Diagnosis not present

## 2023-02-11 DIAGNOSIS — F502 Bulimia nervosa: Secondary | ICD-10-CM | POA: Diagnosis not present

## 2023-02-11 DIAGNOSIS — L97212 Non-pressure chronic ulcer of right calf with fat layer exposed: Secondary | ICD-10-CM | POA: Diagnosis not present

## 2023-02-11 DIAGNOSIS — Z791 Long term (current) use of non-steroidal anti-inflammatories (NSAID): Secondary | ICD-10-CM | POA: Diagnosis not present

## 2023-02-11 DIAGNOSIS — J449 Chronic obstructive pulmonary disease, unspecified: Secondary | ICD-10-CM | POA: Diagnosis not present

## 2023-02-11 DIAGNOSIS — E860 Dehydration: Secondary | ICD-10-CM | POA: Diagnosis not present

## 2023-02-11 DIAGNOSIS — R69 Illness, unspecified: Secondary | ICD-10-CM | POA: Diagnosis not present

## 2023-02-11 DIAGNOSIS — M0600 Rheumatoid arthritis without rheumatoid factor, unspecified site: Secondary | ICD-10-CM | POA: Diagnosis not present

## 2023-02-11 DIAGNOSIS — E43 Unspecified severe protein-calorie malnutrition: Secondary | ICD-10-CM | POA: Diagnosis not present

## 2023-02-11 DIAGNOSIS — E876 Hypokalemia: Secondary | ICD-10-CM | POA: Diagnosis not present

## 2023-02-11 DIAGNOSIS — L97222 Non-pressure chronic ulcer of left calf with fat layer exposed: Secondary | ICD-10-CM | POA: Diagnosis not present

## 2023-02-11 DIAGNOSIS — M810 Age-related osteoporosis without current pathological fracture: Secondary | ICD-10-CM | POA: Diagnosis not present

## 2023-02-11 DIAGNOSIS — I83012 Varicose veins of right lower extremity with ulcer of calf: Secondary | ICD-10-CM | POA: Diagnosis not present

## 2023-02-14 DIAGNOSIS — L97921 Non-pressure chronic ulcer of unspecified part of left lower leg limited to breakdown of skin: Secondary | ICD-10-CM | POA: Diagnosis not present

## 2023-02-14 DIAGNOSIS — L97222 Non-pressure chronic ulcer of left calf with fat layer exposed: Secondary | ICD-10-CM | POA: Diagnosis not present

## 2023-02-14 DIAGNOSIS — R5383 Other fatigue: Secondary | ICD-10-CM | POA: Diagnosis not present

## 2023-02-14 DIAGNOSIS — I83229 Varicose veins of left lower extremity with both ulcer of unspecified site and inflammation: Secondary | ICD-10-CM | POA: Diagnosis not present

## 2023-02-14 DIAGNOSIS — E876 Hypokalemia: Secondary | ICD-10-CM | POA: Diagnosis not present

## 2023-02-14 DIAGNOSIS — M810 Age-related osteoporosis without current pathological fracture: Secondary | ICD-10-CM | POA: Diagnosis not present

## 2023-02-14 DIAGNOSIS — M06049 Rheumatoid arthritis without rheumatoid factor, unspecified hand: Secondary | ICD-10-CM | POA: Diagnosis not present

## 2023-02-14 DIAGNOSIS — I83012 Varicose veins of right lower extremity with ulcer of calf: Secondary | ICD-10-CM | POA: Diagnosis not present

## 2023-02-14 DIAGNOSIS — E43 Unspecified severe protein-calorie malnutrition: Secondary | ICD-10-CM | POA: Diagnosis not present

## 2023-02-14 DIAGNOSIS — F32A Depression, unspecified: Secondary | ICD-10-CM | POA: Diagnosis not present

## 2023-02-14 DIAGNOSIS — M0600 Rheumatoid arthritis without rheumatoid factor, unspecified site: Secondary | ICD-10-CM | POA: Diagnosis not present

## 2023-02-14 DIAGNOSIS — Z791 Long term (current) use of non-steroidal anti-inflammatories (NSAID): Secondary | ICD-10-CM | POA: Diagnosis not present

## 2023-02-14 DIAGNOSIS — I83022 Varicose veins of left lower extremity with ulcer of calf: Secondary | ICD-10-CM | POA: Diagnosis not present

## 2023-02-14 DIAGNOSIS — R69 Illness, unspecified: Secondary | ICD-10-CM | POA: Diagnosis not present

## 2023-02-14 DIAGNOSIS — Z299 Encounter for prophylactic measures, unspecified: Secondary | ICD-10-CM | POA: Diagnosis not present

## 2023-02-14 DIAGNOSIS — F502 Bulimia nervosa: Secondary | ICD-10-CM | POA: Diagnosis not present

## 2023-02-14 DIAGNOSIS — E860 Dehydration: Secondary | ICD-10-CM | POA: Diagnosis not present

## 2023-02-14 DIAGNOSIS — J449 Chronic obstructive pulmonary disease, unspecified: Secondary | ICD-10-CM | POA: Diagnosis not present

## 2023-02-14 DIAGNOSIS — L97212 Non-pressure chronic ulcer of right calf with fat layer exposed: Secondary | ICD-10-CM | POA: Diagnosis not present

## 2023-02-14 DIAGNOSIS — Z0000 Encounter for general adult medical examination without abnormal findings: Secondary | ICD-10-CM | POA: Diagnosis not present

## 2023-02-15 ENCOUNTER — Ambulatory Visit (INDEPENDENT_AMBULATORY_CARE_PROVIDER_SITE_OTHER): Payer: Medicare HMO | Admitting: Gastroenterology

## 2023-02-15 DIAGNOSIS — E7800 Pure hypercholesterolemia, unspecified: Secondary | ICD-10-CM | POA: Diagnosis not present

## 2023-02-15 DIAGNOSIS — Z79899 Other long term (current) drug therapy: Secondary | ICD-10-CM | POA: Diagnosis not present

## 2023-02-15 DIAGNOSIS — R5383 Other fatigue: Secondary | ICD-10-CM | POA: Diagnosis not present

## 2023-02-16 DIAGNOSIS — F502 Bulimia nervosa: Secondary | ICD-10-CM | POA: Diagnosis not present

## 2023-02-16 DIAGNOSIS — L97222 Non-pressure chronic ulcer of left calf with fat layer exposed: Secondary | ICD-10-CM | POA: Diagnosis not present

## 2023-02-16 DIAGNOSIS — E876 Hypokalemia: Secondary | ICD-10-CM | POA: Diagnosis not present

## 2023-02-16 DIAGNOSIS — I83022 Varicose veins of left lower extremity with ulcer of calf: Secondary | ICD-10-CM | POA: Diagnosis not present

## 2023-02-16 DIAGNOSIS — M810 Age-related osteoporosis without current pathological fracture: Secondary | ICD-10-CM | POA: Diagnosis not present

## 2023-02-16 DIAGNOSIS — F32A Depression, unspecified: Secondary | ICD-10-CM | POA: Diagnosis not present

## 2023-02-16 DIAGNOSIS — J449 Chronic obstructive pulmonary disease, unspecified: Secondary | ICD-10-CM | POA: Diagnosis not present

## 2023-02-16 DIAGNOSIS — L97212 Non-pressure chronic ulcer of right calf with fat layer exposed: Secondary | ICD-10-CM | POA: Diagnosis not present

## 2023-02-16 DIAGNOSIS — E43 Unspecified severe protein-calorie malnutrition: Secondary | ICD-10-CM | POA: Diagnosis not present

## 2023-02-16 DIAGNOSIS — M0600 Rheumatoid arthritis without rheumatoid factor, unspecified site: Secondary | ICD-10-CM | POA: Diagnosis not present

## 2023-02-16 DIAGNOSIS — R69 Illness, unspecified: Secondary | ICD-10-CM | POA: Diagnosis not present

## 2023-02-16 DIAGNOSIS — I83012 Varicose veins of right lower extremity with ulcer of calf: Secondary | ICD-10-CM | POA: Diagnosis not present

## 2023-02-16 DIAGNOSIS — Z791 Long term (current) use of non-steroidal anti-inflammatories (NSAID): Secondary | ICD-10-CM | POA: Diagnosis not present

## 2023-02-16 DIAGNOSIS — E860 Dehydration: Secondary | ICD-10-CM | POA: Diagnosis not present

## 2023-02-21 DIAGNOSIS — I83229 Varicose veins of left lower extremity with both ulcer of unspecified site and inflammation: Secondary | ICD-10-CM | POA: Diagnosis not present

## 2023-02-21 DIAGNOSIS — E43 Unspecified severe protein-calorie malnutrition: Secondary | ICD-10-CM | POA: Diagnosis not present

## 2023-02-21 DIAGNOSIS — Z299 Encounter for prophylactic measures, unspecified: Secondary | ICD-10-CM | POA: Diagnosis not present

## 2023-02-21 DIAGNOSIS — M06049 Rheumatoid arthritis without rheumatoid factor, unspecified hand: Secondary | ICD-10-CM | POA: Diagnosis not present

## 2023-02-21 DIAGNOSIS — L97921 Non-pressure chronic ulcer of unspecified part of left lower leg limited to breakdown of skin: Secondary | ICD-10-CM | POA: Diagnosis not present

## 2023-02-22 DIAGNOSIS — L97222 Non-pressure chronic ulcer of left calf with fat layer exposed: Secondary | ICD-10-CM | POA: Diagnosis not present

## 2023-02-22 DIAGNOSIS — I83012 Varicose veins of right lower extremity with ulcer of calf: Secondary | ICD-10-CM | POA: Diagnosis not present

## 2023-02-22 DIAGNOSIS — F502 Bulimia nervosa: Secondary | ICD-10-CM | POA: Diagnosis not present

## 2023-02-22 DIAGNOSIS — M810 Age-related osteoporosis without current pathological fracture: Secondary | ICD-10-CM | POA: Diagnosis not present

## 2023-02-22 DIAGNOSIS — E876 Hypokalemia: Secondary | ICD-10-CM | POA: Diagnosis not present

## 2023-02-22 DIAGNOSIS — I83022 Varicose veins of left lower extremity with ulcer of calf: Secondary | ICD-10-CM | POA: Diagnosis not present

## 2023-02-22 DIAGNOSIS — J449 Chronic obstructive pulmonary disease, unspecified: Secondary | ICD-10-CM | POA: Diagnosis not present

## 2023-02-22 DIAGNOSIS — M0600 Rheumatoid arthritis without rheumatoid factor, unspecified site: Secondary | ICD-10-CM | POA: Diagnosis not present

## 2023-02-22 DIAGNOSIS — R69 Illness, unspecified: Secondary | ICD-10-CM | POA: Diagnosis not present

## 2023-02-22 DIAGNOSIS — E43 Unspecified severe protein-calorie malnutrition: Secondary | ICD-10-CM | POA: Diagnosis not present

## 2023-02-22 DIAGNOSIS — E860 Dehydration: Secondary | ICD-10-CM | POA: Diagnosis not present

## 2023-02-22 DIAGNOSIS — Z791 Long term (current) use of non-steroidal anti-inflammatories (NSAID): Secondary | ICD-10-CM | POA: Diagnosis not present

## 2023-02-22 DIAGNOSIS — F32A Depression, unspecified: Secondary | ICD-10-CM | POA: Diagnosis not present

## 2023-02-22 DIAGNOSIS — L97212 Non-pressure chronic ulcer of right calf with fat layer exposed: Secondary | ICD-10-CM | POA: Diagnosis not present

## 2023-03-01 DIAGNOSIS — R69 Illness, unspecified: Secondary | ICD-10-CM | POA: Diagnosis not present

## 2023-03-01 DIAGNOSIS — J449 Chronic obstructive pulmonary disease, unspecified: Secondary | ICD-10-CM | POA: Diagnosis not present

## 2023-03-01 DIAGNOSIS — L97222 Non-pressure chronic ulcer of left calf with fat layer exposed: Secondary | ICD-10-CM | POA: Diagnosis not present

## 2023-03-01 DIAGNOSIS — I83022 Varicose veins of left lower extremity with ulcer of calf: Secondary | ICD-10-CM | POA: Diagnosis not present

## 2023-03-03 DIAGNOSIS — I83012 Varicose veins of right lower extremity with ulcer of calf: Secondary | ICD-10-CM | POA: Diagnosis not present

## 2023-03-03 DIAGNOSIS — F502 Bulimia nervosa: Secondary | ICD-10-CM | POA: Diagnosis not present

## 2023-03-03 DIAGNOSIS — J449 Chronic obstructive pulmonary disease, unspecified: Secondary | ICD-10-CM | POA: Diagnosis not present

## 2023-03-03 DIAGNOSIS — M0600 Rheumatoid arthritis without rheumatoid factor, unspecified site: Secondary | ICD-10-CM | POA: Diagnosis not present

## 2023-03-03 DIAGNOSIS — F32A Depression, unspecified: Secondary | ICD-10-CM | POA: Diagnosis not present

## 2023-03-03 DIAGNOSIS — L97222 Non-pressure chronic ulcer of left calf with fat layer exposed: Secondary | ICD-10-CM | POA: Diagnosis not present

## 2023-03-03 DIAGNOSIS — M810 Age-related osteoporosis without current pathological fracture: Secondary | ICD-10-CM | POA: Diagnosis not present

## 2023-03-03 DIAGNOSIS — I83022 Varicose veins of left lower extremity with ulcer of calf: Secondary | ICD-10-CM | POA: Diagnosis not present

## 2023-03-03 DIAGNOSIS — R69 Illness, unspecified: Secondary | ICD-10-CM | POA: Diagnosis not present

## 2023-03-03 DIAGNOSIS — E43 Unspecified severe protein-calorie malnutrition: Secondary | ICD-10-CM | POA: Diagnosis not present

## 2023-03-03 DIAGNOSIS — L97212 Non-pressure chronic ulcer of right calf with fat layer exposed: Secondary | ICD-10-CM | POA: Diagnosis not present

## 2023-03-03 DIAGNOSIS — E860 Dehydration: Secondary | ICD-10-CM | POA: Diagnosis not present

## 2023-03-03 DIAGNOSIS — E876 Hypokalemia: Secondary | ICD-10-CM | POA: Diagnosis not present

## 2023-03-03 DIAGNOSIS — Z791 Long term (current) use of non-steroidal anti-inflammatories (NSAID): Secondary | ICD-10-CM | POA: Diagnosis not present

## 2023-03-08 DIAGNOSIS — E538 Deficiency of other specified B group vitamins: Secondary | ICD-10-CM | POA: Diagnosis not present

## 2023-03-08 DIAGNOSIS — J069 Acute upper respiratory infection, unspecified: Secondary | ICD-10-CM | POA: Diagnosis not present

## 2023-03-08 DIAGNOSIS — M25541 Pain in joints of right hand: Secondary | ICD-10-CM | POA: Diagnosis not present

## 2023-03-08 DIAGNOSIS — F17210 Nicotine dependence, cigarettes, uncomplicated: Secondary | ICD-10-CM | POA: Diagnosis not present

## 2023-03-08 DIAGNOSIS — Z299 Encounter for prophylactic measures, unspecified: Secondary | ICD-10-CM | POA: Diagnosis not present

## 2023-03-08 DIAGNOSIS — J449 Chronic obstructive pulmonary disease, unspecified: Secondary | ICD-10-CM | POA: Diagnosis not present

## 2023-03-08 DIAGNOSIS — R69 Illness, unspecified: Secondary | ICD-10-CM | POA: Diagnosis not present

## 2023-03-09 DIAGNOSIS — F32A Depression, unspecified: Secondary | ICD-10-CM | POA: Diagnosis not present

## 2023-03-09 DIAGNOSIS — E43 Unspecified severe protein-calorie malnutrition: Secondary | ICD-10-CM | POA: Diagnosis not present

## 2023-03-09 DIAGNOSIS — J449 Chronic obstructive pulmonary disease, unspecified: Secondary | ICD-10-CM | POA: Diagnosis not present

## 2023-03-09 DIAGNOSIS — I83022 Varicose veins of left lower extremity with ulcer of calf: Secondary | ICD-10-CM | POA: Diagnosis not present

## 2023-03-09 DIAGNOSIS — R69 Illness, unspecified: Secondary | ICD-10-CM | POA: Diagnosis not present

## 2023-03-09 DIAGNOSIS — E860 Dehydration: Secondary | ICD-10-CM | POA: Diagnosis not present

## 2023-03-09 DIAGNOSIS — M0600 Rheumatoid arthritis without rheumatoid factor, unspecified site: Secondary | ICD-10-CM | POA: Diagnosis not present

## 2023-03-09 DIAGNOSIS — L97212 Non-pressure chronic ulcer of right calf with fat layer exposed: Secondary | ICD-10-CM | POA: Diagnosis not present

## 2023-03-09 DIAGNOSIS — Z791 Long term (current) use of non-steroidal anti-inflammatories (NSAID): Secondary | ICD-10-CM | POA: Diagnosis not present

## 2023-03-09 DIAGNOSIS — M810 Age-related osteoporosis without current pathological fracture: Secondary | ICD-10-CM | POA: Diagnosis not present

## 2023-03-09 DIAGNOSIS — F502 Bulimia nervosa: Secondary | ICD-10-CM | POA: Diagnosis not present

## 2023-03-09 DIAGNOSIS — L97222 Non-pressure chronic ulcer of left calf with fat layer exposed: Secondary | ICD-10-CM | POA: Diagnosis not present

## 2023-03-09 DIAGNOSIS — E876 Hypokalemia: Secondary | ICD-10-CM | POA: Diagnosis not present

## 2023-03-09 DIAGNOSIS — I83012 Varicose veins of right lower extremity with ulcer of calf: Secondary | ICD-10-CM | POA: Diagnosis not present

## 2023-03-11 DIAGNOSIS — J449 Chronic obstructive pulmonary disease, unspecified: Secondary | ICD-10-CM | POA: Diagnosis not present

## 2023-03-11 DIAGNOSIS — E43 Unspecified severe protein-calorie malnutrition: Secondary | ICD-10-CM | POA: Diagnosis not present

## 2023-03-11 DIAGNOSIS — I83022 Varicose veins of left lower extremity with ulcer of calf: Secondary | ICD-10-CM | POA: Diagnosis not present

## 2023-03-11 DIAGNOSIS — F419 Anxiety disorder, unspecified: Secondary | ICD-10-CM | POA: Diagnosis not present

## 2023-03-11 DIAGNOSIS — F17210 Nicotine dependence, cigarettes, uncomplicated: Secondary | ICD-10-CM | POA: Diagnosis not present

## 2023-03-11 DIAGNOSIS — E860 Dehydration: Secondary | ICD-10-CM | POA: Diagnosis not present

## 2023-03-11 DIAGNOSIS — M810 Age-related osteoporosis without current pathological fracture: Secondary | ICD-10-CM | POA: Diagnosis not present

## 2023-03-11 DIAGNOSIS — E876 Hypokalemia: Secondary | ICD-10-CM | POA: Diagnosis not present

## 2023-03-11 DIAGNOSIS — F32A Depression, unspecified: Secondary | ICD-10-CM | POA: Diagnosis not present

## 2023-03-11 DIAGNOSIS — M0600 Rheumatoid arthritis without rheumatoid factor, unspecified site: Secondary | ICD-10-CM | POA: Diagnosis not present

## 2023-03-11 DIAGNOSIS — F502 Bulimia nervosa: Secondary | ICD-10-CM | POA: Diagnosis not present

## 2023-03-11 DIAGNOSIS — Z791 Long term (current) use of non-steroidal anti-inflammatories (NSAID): Secondary | ICD-10-CM | POA: Diagnosis not present

## 2023-03-11 DIAGNOSIS — L97222 Non-pressure chronic ulcer of left calf with fat layer exposed: Secondary | ICD-10-CM | POA: Diagnosis not present

## 2023-03-15 DIAGNOSIS — J449 Chronic obstructive pulmonary disease, unspecified: Secondary | ICD-10-CM | POA: Diagnosis not present

## 2023-03-15 DIAGNOSIS — I83022 Varicose veins of left lower extremity with ulcer of calf: Secondary | ICD-10-CM | POA: Diagnosis not present

## 2023-03-15 DIAGNOSIS — F32A Depression, unspecified: Secondary | ICD-10-CM | POA: Diagnosis not present

## 2023-03-15 DIAGNOSIS — M810 Age-related osteoporosis without current pathological fracture: Secondary | ICD-10-CM | POA: Diagnosis not present

## 2023-03-15 DIAGNOSIS — F419 Anxiety disorder, unspecified: Secondary | ICD-10-CM | POA: Diagnosis not present

## 2023-03-15 DIAGNOSIS — F17210 Nicotine dependence, cigarettes, uncomplicated: Secondary | ICD-10-CM | POA: Diagnosis not present

## 2023-03-15 DIAGNOSIS — E876 Hypokalemia: Secondary | ICD-10-CM | POA: Diagnosis not present

## 2023-03-15 DIAGNOSIS — L97222 Non-pressure chronic ulcer of left calf with fat layer exposed: Secondary | ICD-10-CM | POA: Diagnosis not present

## 2023-03-15 DIAGNOSIS — M0600 Rheumatoid arthritis without rheumatoid factor, unspecified site: Secondary | ICD-10-CM | POA: Diagnosis not present

## 2023-03-15 DIAGNOSIS — Z791 Long term (current) use of non-steroidal anti-inflammatories (NSAID): Secondary | ICD-10-CM | POA: Diagnosis not present

## 2023-03-15 DIAGNOSIS — E43 Unspecified severe protein-calorie malnutrition: Secondary | ICD-10-CM | POA: Diagnosis not present

## 2023-03-15 DIAGNOSIS — E860 Dehydration: Secondary | ICD-10-CM | POA: Diagnosis not present

## 2023-03-15 DIAGNOSIS — F502 Bulimia nervosa: Secondary | ICD-10-CM | POA: Diagnosis not present

## 2023-03-24 DIAGNOSIS — Z791 Long term (current) use of non-steroidal anti-inflammatories (NSAID): Secondary | ICD-10-CM | POA: Diagnosis not present

## 2023-03-24 DIAGNOSIS — E876 Hypokalemia: Secondary | ICD-10-CM | POA: Diagnosis not present

## 2023-03-24 DIAGNOSIS — F32A Depression, unspecified: Secondary | ICD-10-CM | POA: Diagnosis not present

## 2023-03-24 DIAGNOSIS — L97222 Non-pressure chronic ulcer of left calf with fat layer exposed: Secondary | ICD-10-CM | POA: Diagnosis not present

## 2023-03-24 DIAGNOSIS — M0600 Rheumatoid arthritis without rheumatoid factor, unspecified site: Secondary | ICD-10-CM | POA: Diagnosis not present

## 2023-03-24 DIAGNOSIS — M810 Age-related osteoporosis without current pathological fracture: Secondary | ICD-10-CM | POA: Diagnosis not present

## 2023-03-24 DIAGNOSIS — E860 Dehydration: Secondary | ICD-10-CM | POA: Diagnosis not present

## 2023-03-24 DIAGNOSIS — F502 Bulimia nervosa: Secondary | ICD-10-CM | POA: Diagnosis not present

## 2023-03-24 DIAGNOSIS — F419 Anxiety disorder, unspecified: Secondary | ICD-10-CM | POA: Diagnosis not present

## 2023-03-24 DIAGNOSIS — I83022 Varicose veins of left lower extremity with ulcer of calf: Secondary | ICD-10-CM | POA: Diagnosis not present

## 2023-03-24 DIAGNOSIS — F17210 Nicotine dependence, cigarettes, uncomplicated: Secondary | ICD-10-CM | POA: Diagnosis not present

## 2023-03-24 DIAGNOSIS — J449 Chronic obstructive pulmonary disease, unspecified: Secondary | ICD-10-CM | POA: Diagnosis not present

## 2023-03-24 DIAGNOSIS — E43 Unspecified severe protein-calorie malnutrition: Secondary | ICD-10-CM | POA: Diagnosis not present

## 2023-03-29 DIAGNOSIS — L97929 Non-pressure chronic ulcer of unspecified part of left lower leg with unspecified severity: Secondary | ICD-10-CM | POA: Diagnosis not present

## 2023-03-29 DIAGNOSIS — Z4800 Encounter for change or removal of nonsurgical wound dressing: Secondary | ICD-10-CM | POA: Diagnosis not present

## 2023-03-30 DIAGNOSIS — F17210 Nicotine dependence, cigarettes, uncomplicated: Secondary | ICD-10-CM | POA: Diagnosis not present

## 2023-03-30 DIAGNOSIS — E876 Hypokalemia: Secondary | ICD-10-CM | POA: Diagnosis not present

## 2023-03-30 DIAGNOSIS — L97222 Non-pressure chronic ulcer of left calf with fat layer exposed: Secondary | ICD-10-CM | POA: Diagnosis not present

## 2023-03-30 DIAGNOSIS — Z791 Long term (current) use of non-steroidal anti-inflammatories (NSAID): Secondary | ICD-10-CM | POA: Diagnosis not present

## 2023-03-30 DIAGNOSIS — M810 Age-related osteoporosis without current pathological fracture: Secondary | ICD-10-CM | POA: Diagnosis not present

## 2023-03-30 DIAGNOSIS — M0600 Rheumatoid arthritis without rheumatoid factor, unspecified site: Secondary | ICD-10-CM | POA: Diagnosis not present

## 2023-03-30 DIAGNOSIS — F32A Depression, unspecified: Secondary | ICD-10-CM | POA: Diagnosis not present

## 2023-03-30 DIAGNOSIS — F419 Anxiety disorder, unspecified: Secondary | ICD-10-CM | POA: Diagnosis not present

## 2023-03-30 DIAGNOSIS — E860 Dehydration: Secondary | ICD-10-CM | POA: Diagnosis not present

## 2023-03-30 DIAGNOSIS — F502 Bulimia nervosa: Secondary | ICD-10-CM | POA: Diagnosis not present

## 2023-03-30 DIAGNOSIS — I83022 Varicose veins of left lower extremity with ulcer of calf: Secondary | ICD-10-CM | POA: Diagnosis not present

## 2023-03-30 DIAGNOSIS — J449 Chronic obstructive pulmonary disease, unspecified: Secondary | ICD-10-CM | POA: Diagnosis not present

## 2023-03-30 DIAGNOSIS — E43 Unspecified severe protein-calorie malnutrition: Secondary | ICD-10-CM | POA: Diagnosis not present

## 2023-04-06 DIAGNOSIS — E860 Dehydration: Secondary | ICD-10-CM | POA: Diagnosis not present

## 2023-04-06 DIAGNOSIS — J449 Chronic obstructive pulmonary disease, unspecified: Secondary | ICD-10-CM | POA: Diagnosis not present

## 2023-04-06 DIAGNOSIS — F419 Anxiety disorder, unspecified: Secondary | ICD-10-CM | POA: Diagnosis not present

## 2023-04-06 DIAGNOSIS — L97222 Non-pressure chronic ulcer of left calf with fat layer exposed: Secondary | ICD-10-CM | POA: Diagnosis not present

## 2023-04-06 DIAGNOSIS — M0600 Rheumatoid arthritis without rheumatoid factor, unspecified site: Secondary | ICD-10-CM | POA: Diagnosis not present

## 2023-04-06 DIAGNOSIS — E43 Unspecified severe protein-calorie malnutrition: Secondary | ICD-10-CM | POA: Diagnosis not present

## 2023-04-06 DIAGNOSIS — F502 Bulimia nervosa: Secondary | ICD-10-CM | POA: Diagnosis not present

## 2023-04-06 DIAGNOSIS — E876 Hypokalemia: Secondary | ICD-10-CM | POA: Diagnosis not present

## 2023-04-06 DIAGNOSIS — Z791 Long term (current) use of non-steroidal anti-inflammatories (NSAID): Secondary | ICD-10-CM | POA: Diagnosis not present

## 2023-04-06 DIAGNOSIS — I83022 Varicose veins of left lower extremity with ulcer of calf: Secondary | ICD-10-CM | POA: Diagnosis not present

## 2023-04-06 DIAGNOSIS — F17210 Nicotine dependence, cigarettes, uncomplicated: Secondary | ICD-10-CM | POA: Diagnosis not present

## 2023-04-06 DIAGNOSIS — M810 Age-related osteoporosis without current pathological fracture: Secondary | ICD-10-CM | POA: Diagnosis not present

## 2023-04-06 DIAGNOSIS — F32A Depression, unspecified: Secondary | ICD-10-CM | POA: Diagnosis not present

## 2023-04-10 DIAGNOSIS — E876 Hypokalemia: Secondary | ICD-10-CM | POA: Diagnosis not present

## 2023-04-10 DIAGNOSIS — E860 Dehydration: Secondary | ICD-10-CM | POA: Diagnosis not present

## 2023-04-10 DIAGNOSIS — I83022 Varicose veins of left lower extremity with ulcer of calf: Secondary | ICD-10-CM | POA: Diagnosis not present

## 2023-04-10 DIAGNOSIS — E43 Unspecified severe protein-calorie malnutrition: Secondary | ICD-10-CM | POA: Diagnosis not present

## 2023-04-10 DIAGNOSIS — F502 Bulimia nervosa: Secondary | ICD-10-CM | POA: Diagnosis not present

## 2023-04-10 DIAGNOSIS — F32A Depression, unspecified: Secondary | ICD-10-CM | POA: Diagnosis not present

## 2023-04-10 DIAGNOSIS — F17210 Nicotine dependence, cigarettes, uncomplicated: Secondary | ICD-10-CM | POA: Diagnosis not present

## 2023-04-10 DIAGNOSIS — J449 Chronic obstructive pulmonary disease, unspecified: Secondary | ICD-10-CM | POA: Diagnosis not present

## 2023-04-10 DIAGNOSIS — M810 Age-related osteoporosis without current pathological fracture: Secondary | ICD-10-CM | POA: Diagnosis not present

## 2023-04-10 DIAGNOSIS — M0600 Rheumatoid arthritis without rheumatoid factor, unspecified site: Secondary | ICD-10-CM | POA: Diagnosis not present

## 2023-04-10 DIAGNOSIS — Z791 Long term (current) use of non-steroidal anti-inflammatories (NSAID): Secondary | ICD-10-CM | POA: Diagnosis not present

## 2023-04-10 DIAGNOSIS — F419 Anxiety disorder, unspecified: Secondary | ICD-10-CM | POA: Diagnosis not present

## 2023-04-10 DIAGNOSIS — L97222 Non-pressure chronic ulcer of left calf with fat layer exposed: Secondary | ICD-10-CM | POA: Diagnosis not present

## 2023-04-12 DIAGNOSIS — F419 Anxiety disorder, unspecified: Secondary | ICD-10-CM | POA: Diagnosis not present

## 2023-04-12 DIAGNOSIS — F32A Depression, unspecified: Secondary | ICD-10-CM | POA: Diagnosis not present

## 2023-04-12 DIAGNOSIS — E876 Hypokalemia: Secondary | ICD-10-CM | POA: Diagnosis not present

## 2023-04-12 DIAGNOSIS — M810 Age-related osteoporosis without current pathological fracture: Secondary | ICD-10-CM | POA: Diagnosis not present

## 2023-04-12 DIAGNOSIS — E860 Dehydration: Secondary | ICD-10-CM | POA: Diagnosis not present

## 2023-04-12 DIAGNOSIS — I83022 Varicose veins of left lower extremity with ulcer of calf: Secondary | ICD-10-CM | POA: Diagnosis not present

## 2023-04-12 DIAGNOSIS — E43 Unspecified severe protein-calorie malnutrition: Secondary | ICD-10-CM | POA: Diagnosis not present

## 2023-04-12 DIAGNOSIS — F17210 Nicotine dependence, cigarettes, uncomplicated: Secondary | ICD-10-CM | POA: Diagnosis not present

## 2023-04-12 DIAGNOSIS — F502 Bulimia nervosa: Secondary | ICD-10-CM | POA: Diagnosis not present

## 2023-04-12 DIAGNOSIS — M0600 Rheumatoid arthritis without rheumatoid factor, unspecified site: Secondary | ICD-10-CM | POA: Diagnosis not present

## 2023-04-12 DIAGNOSIS — L97222 Non-pressure chronic ulcer of left calf with fat layer exposed: Secondary | ICD-10-CM | POA: Diagnosis not present

## 2023-04-12 DIAGNOSIS — J449 Chronic obstructive pulmonary disease, unspecified: Secondary | ICD-10-CM | POA: Diagnosis not present

## 2023-04-12 DIAGNOSIS — Z791 Long term (current) use of non-steroidal anti-inflammatories (NSAID): Secondary | ICD-10-CM | POA: Diagnosis not present

## 2023-04-13 DIAGNOSIS — H2513 Age-related nuclear cataract, bilateral: Secondary | ICD-10-CM | POA: Diagnosis not present

## 2023-04-19 DIAGNOSIS — H04123 Dry eye syndrome of bilateral lacrimal glands: Secondary | ICD-10-CM | POA: Diagnosis not present

## 2023-04-19 DIAGNOSIS — H35363 Drusen (degenerative) of macula, bilateral: Secondary | ICD-10-CM | POA: Diagnosis not present

## 2023-04-19 DIAGNOSIS — H25813 Combined forms of age-related cataract, bilateral: Secondary | ICD-10-CM | POA: Diagnosis not present

## 2023-04-21 DIAGNOSIS — F17210 Nicotine dependence, cigarettes, uncomplicated: Secondary | ICD-10-CM | POA: Diagnosis not present

## 2023-04-21 DIAGNOSIS — L97222 Non-pressure chronic ulcer of left calf with fat layer exposed: Secondary | ICD-10-CM | POA: Diagnosis not present

## 2023-04-21 DIAGNOSIS — M0600 Rheumatoid arthritis without rheumatoid factor, unspecified site: Secondary | ICD-10-CM | POA: Diagnosis not present

## 2023-04-21 DIAGNOSIS — E860 Dehydration: Secondary | ICD-10-CM | POA: Diagnosis not present

## 2023-04-21 DIAGNOSIS — E876 Hypokalemia: Secondary | ICD-10-CM | POA: Diagnosis not present

## 2023-04-21 DIAGNOSIS — E43 Unspecified severe protein-calorie malnutrition: Secondary | ICD-10-CM | POA: Diagnosis not present

## 2023-04-21 DIAGNOSIS — I83022 Varicose veins of left lower extremity with ulcer of calf: Secondary | ICD-10-CM | POA: Diagnosis not present

## 2023-04-21 DIAGNOSIS — J449 Chronic obstructive pulmonary disease, unspecified: Secondary | ICD-10-CM | POA: Diagnosis not present

## 2023-04-21 DIAGNOSIS — Z791 Long term (current) use of non-steroidal anti-inflammatories (NSAID): Secondary | ICD-10-CM | POA: Diagnosis not present

## 2023-04-21 DIAGNOSIS — F32A Depression, unspecified: Secondary | ICD-10-CM | POA: Diagnosis not present

## 2023-04-21 DIAGNOSIS — F502 Bulimia nervosa: Secondary | ICD-10-CM | POA: Diagnosis not present

## 2023-04-21 DIAGNOSIS — M810 Age-related osteoporosis without current pathological fracture: Secondary | ICD-10-CM | POA: Diagnosis not present

## 2023-04-21 DIAGNOSIS — F419 Anxiety disorder, unspecified: Secondary | ICD-10-CM | POA: Diagnosis not present

## 2023-04-27 DIAGNOSIS — E876 Hypokalemia: Secondary | ICD-10-CM | POA: Diagnosis not present

## 2023-04-27 DIAGNOSIS — L97222 Non-pressure chronic ulcer of left calf with fat layer exposed: Secondary | ICD-10-CM | POA: Diagnosis not present

## 2023-04-27 DIAGNOSIS — I83022 Varicose veins of left lower extremity with ulcer of calf: Secondary | ICD-10-CM | POA: Diagnosis not present

## 2023-04-27 DIAGNOSIS — E860 Dehydration: Secondary | ICD-10-CM | POA: Diagnosis not present

## 2023-04-27 DIAGNOSIS — J449 Chronic obstructive pulmonary disease, unspecified: Secondary | ICD-10-CM | POA: Diagnosis not present

## 2023-04-27 DIAGNOSIS — M0600 Rheumatoid arthritis without rheumatoid factor, unspecified site: Secondary | ICD-10-CM | POA: Diagnosis not present

## 2023-04-27 DIAGNOSIS — Z791 Long term (current) use of non-steroidal anti-inflammatories (NSAID): Secondary | ICD-10-CM | POA: Diagnosis not present

## 2023-04-27 DIAGNOSIS — F419 Anxiety disorder, unspecified: Secondary | ICD-10-CM | POA: Diagnosis not present

## 2023-04-27 DIAGNOSIS — M810 Age-related osteoporosis without current pathological fracture: Secondary | ICD-10-CM | POA: Diagnosis not present

## 2023-04-27 DIAGNOSIS — E43 Unspecified severe protein-calorie malnutrition: Secondary | ICD-10-CM | POA: Diagnosis not present

## 2023-04-27 DIAGNOSIS — F17210 Nicotine dependence, cigarettes, uncomplicated: Secondary | ICD-10-CM | POA: Diagnosis not present

## 2023-04-27 DIAGNOSIS — F32A Depression, unspecified: Secondary | ICD-10-CM | POA: Diagnosis not present

## 2023-04-27 DIAGNOSIS — F502 Bulimia nervosa: Secondary | ICD-10-CM | POA: Diagnosis not present

## 2023-05-04 DIAGNOSIS — F502 Bulimia nervosa: Secondary | ICD-10-CM | POA: Diagnosis not present

## 2023-05-04 DIAGNOSIS — F32A Depression, unspecified: Secondary | ICD-10-CM | POA: Diagnosis not present

## 2023-05-04 DIAGNOSIS — Z791 Long term (current) use of non-steroidal anti-inflammatories (NSAID): Secondary | ICD-10-CM | POA: Diagnosis not present

## 2023-05-04 DIAGNOSIS — I83022 Varicose veins of left lower extremity with ulcer of calf: Secondary | ICD-10-CM | POA: Diagnosis not present

## 2023-05-04 DIAGNOSIS — L97222 Non-pressure chronic ulcer of left calf with fat layer exposed: Secondary | ICD-10-CM | POA: Diagnosis not present

## 2023-05-04 DIAGNOSIS — E876 Hypokalemia: Secondary | ICD-10-CM | POA: Diagnosis not present

## 2023-05-04 DIAGNOSIS — E860 Dehydration: Secondary | ICD-10-CM | POA: Diagnosis not present

## 2023-05-04 DIAGNOSIS — J449 Chronic obstructive pulmonary disease, unspecified: Secondary | ICD-10-CM | POA: Diagnosis not present

## 2023-05-04 DIAGNOSIS — F17210 Nicotine dependence, cigarettes, uncomplicated: Secondary | ICD-10-CM | POA: Diagnosis not present

## 2023-05-04 DIAGNOSIS — E43 Unspecified severe protein-calorie malnutrition: Secondary | ICD-10-CM | POA: Diagnosis not present

## 2023-05-04 DIAGNOSIS — M0600 Rheumatoid arthritis without rheumatoid factor, unspecified site: Secondary | ICD-10-CM | POA: Diagnosis not present

## 2023-05-04 DIAGNOSIS — M810 Age-related osteoporosis without current pathological fracture: Secondary | ICD-10-CM | POA: Diagnosis not present

## 2023-05-04 DIAGNOSIS — F419 Anxiety disorder, unspecified: Secondary | ICD-10-CM | POA: Diagnosis not present

## 2023-05-26 NOTE — H&P (Signed)
Surgical History & Physical  Patient Name: Kristine Roberts  DOB: [birth year]  Surgery: Cataract extraction with intraocular lens implant phacoemulsification; Right Eye Surgeon: Pecolia Ades MD Surgery Date: 06/03/2023 Pre-Op Date: 04/19/2023  HPI: A 76 Yr. old female patient present for cataract eval per Dr. Daphine Deutscher. 1. The patient complains of difficulty with all activities: reading fine print, watching TV, seeing road signs, and when driving due to glare from headlights or sun, which began 2 years ago. Both eyes are affected. The episode is constant. The condition's severity is worsening. This is negatively affecting the patient's quality of life and the patient is unable to function adequately in life with the current level of vision.  Medical History: Cataracts   Arthritis Lung Problems anxiety and depression disorder, insomnia, acid reflux   Review of Systems Psychiatry Anxiety, Depression All recorded systems are negative except as noted above.  Social Current every day smoker   Medication Visine,  Furosemide, Methotrexate, Pantoprazole, Sertraline, Trazodone  Sx/Procedures Broken hip repair - bilateral  Drug Allergies  Penicillin  History & Physical: Heent: cataracts NECK: supple without bruits LUNGS: lungs clear to auscultation CV: regular rate and rhythm Abdomen: soft and non-tender  Impression & Plan: Assessment: 1.  CATARACT AGE-RELATED COMBINED FORMS; Both Eyes (H25.813) 2.  Hyperopia ; Both Eyes (H52.03) 3.  DRY EYE SYNDROME/TEAR FILM INSUFFICIENCY; Both Eyes (H04.123) 4.  DRUSEN; Both Eyes (H35.363)  Plan: 1.  Cataracts are visually significant and account for the patient's complaints. Discussed all risks, benefits, procedures and recovery, including infection, loss of vision and eye, need for glasses after surgery or additional procedures. Patient understands changing glasses will not improve vision. Patient indicated understanding of procedure. All  questions answered. Patient desires to have surgery, recommend phacoemulsification with intraocular lens. Patient to have preliminary testing necessary (Argos/IOL Master, Mac OCT, TOPO) Educational materials provided.  Plan: - Proceed with cataract surgery OD, followed by OS - DIB00 lens with best distance target - no prior eye surgeries - ok with lying flat - Has drusen which may be affecting vision somewhat - no fuchs, no DM  2.  New glasses per Dr. Daphine Deutscher  3.  Findings, prognosis and treatment options reviewed. Begin basic treatment regimen; warm compresses, lid scrubs, 1000-2000mg  omega 3 fatty acids, and preserved artificial tears QID.  4.  Dry AMD, unclear if this is affecting vision currently. Discussed with patient today and will plan for repeat OCT Mac after surgery

## 2023-05-30 ENCOUNTER — Encounter (HOSPITAL_COMMUNITY)
Admission: RE | Admit: 2023-05-30 | Discharge: 2023-05-30 | Disposition: A | Discharge status: Home / Self Care | Payer: Medicare HMO | Source: Ambulatory Visit | Attending: Optometry | Admitting: Optometry

## 2023-06-03 ENCOUNTER — Ambulatory Visit (HOSPITAL_COMMUNITY): Payer: Medicare HMO | Admitting: Anesthesiology

## 2023-06-03 ENCOUNTER — Encounter (HOSPITAL_COMMUNITY)
Admission: RE | Disposition: A | Discharge status: Home / Self Care | Payer: Self-pay | Source: Home / Self Care | Attending: Optometry

## 2023-06-03 ENCOUNTER — Ambulatory Visit (HOSPITAL_COMMUNITY)
Admission: RE | Admit: 2023-06-03 | Discharge: 2023-06-03 | Disposition: A | Discharge status: Home / Self Care | Payer: Medicare HMO | Attending: Optometry | Admitting: Optometry

## 2023-06-03 ENCOUNTER — Ambulatory Visit (HOSPITAL_BASED_OUTPATIENT_CLINIC_OR_DEPARTMENT_OTHER): Payer: Medicare HMO | Admitting: Anesthesiology

## 2023-06-03 DIAGNOSIS — J449 Chronic obstructive pulmonary disease, unspecified: Secondary | ICD-10-CM | POA: Diagnosis not present

## 2023-06-03 DIAGNOSIS — H25811 Combined forms of age-related cataract, right eye: Secondary | ICD-10-CM

## 2023-06-03 DIAGNOSIS — I499 Cardiac arrhythmia, unspecified: Secondary | ICD-10-CM | POA: Diagnosis not present

## 2023-06-03 DIAGNOSIS — H5203 Hypermetropia, bilateral: Secondary | ICD-10-CM | POA: Diagnosis not present

## 2023-06-03 DIAGNOSIS — F17200 Nicotine dependence, unspecified, uncomplicated: Secondary | ICD-10-CM | POA: Diagnosis not present

## 2023-06-03 DIAGNOSIS — H35363 Drusen (degenerative) of macula, bilateral: Secondary | ICD-10-CM | POA: Insufficient documentation

## 2023-06-03 DIAGNOSIS — H2511 Age-related nuclear cataract, right eye: Secondary | ICD-10-CM | POA: Insufficient documentation

## 2023-06-03 DIAGNOSIS — F17210 Nicotine dependence, cigarettes, uncomplicated: Secondary | ICD-10-CM

## 2023-06-03 DIAGNOSIS — H04123 Dry eye syndrome of bilateral lacrimal glands: Secondary | ICD-10-CM | POA: Diagnosis not present

## 2023-06-03 HISTORY — PX: CATARACT EXTRACTION W/PHACO: SHX586

## 2023-06-03 SURGERY — PHACOEMULSIFICATION, CATARACT, WITH IOL INSERTION
Anesthesia: Monitor Anesthesia Care | Site: Eye | Laterality: Right

## 2023-06-03 MED ORDER — FENTANYL CITRATE (PF) 100 MCG/2ML IJ SOLN
INTRAMUSCULAR | Status: DC | PRN
  Administered 2023-06-03: 50 ug via INTRAVENOUS

## 2023-06-03 MED ORDER — PHENYLEPHRINE-KETOROLAC 1-0.3 % IO SOLN
INTRAOCULAR | Status: AC
  Filled 2023-06-03: qty 4

## 2023-06-03 MED ORDER — SODIUM CHLORIDE 0.9% FLUSH
INTRAVENOUS | Status: DC | PRN
  Administered 2023-06-03 (×2): 3 mL via INTRAVENOUS

## 2023-06-03 MED ORDER — POVIDONE-IODINE 5 % OP SOLN
OPHTHALMIC | Status: DC | PRN
  Administered 2023-06-03: 1 via OPHTHALMIC

## 2023-06-03 MED ORDER — TROPICAMIDE 1 % OP SOLN
1.0000 [drp] | OPHTHALMIC | Status: AC
  Administered 2023-06-03 (×3): 1 [drp] via OPHTHALMIC

## 2023-06-03 MED ORDER — NEOMYCIN-POLYMYXIN-DEXAMETH 3.5-10000-0.1 OP SUSP
OPHTHALMIC | Status: DC | PRN
  Administered 2023-06-03: 2 [drp] via OPHTHALMIC

## 2023-06-03 MED ORDER — BSS IO SOLN
INTRAOCULAR | Status: DC | PRN
  Administered 2023-06-03: 15 mL via INTRAOCULAR

## 2023-06-03 MED ORDER — MIDAZOLAM HCL 2 MG/2ML IJ SOLN
INTRAMUSCULAR | Status: AC
  Filled 2023-06-03: qty 2

## 2023-06-03 MED ORDER — STERILE WATER FOR IRRIGATION IR SOLN
Status: DC | PRN
  Administered 2023-06-03: 250 mL

## 2023-06-03 MED ORDER — TETRACAINE HCL 0.5 % OP SOLN
1.0000 [drp] | OPHTHALMIC | Status: AC
  Administered 2023-06-03 (×3): 1 [drp] via OPHTHALMIC

## 2023-06-03 MED ORDER — PHENYLEPHRINE HCL 2.5 % OP SOLN
1.0000 [drp] | OPHTHALMIC | Status: AC
  Administered 2023-06-03 (×3): 1 [drp] via OPHTHALMIC

## 2023-06-03 MED ORDER — MIDAZOLAM HCL 2 MG/2ML IJ SOLN
INTRAMUSCULAR | Status: DC | PRN
  Administered 2023-06-03 (×2): 1 mg via INTRAVENOUS

## 2023-06-03 MED ORDER — FENTANYL CITRATE (PF) 100 MCG/2ML IJ SOLN
INTRAMUSCULAR | Status: AC
  Filled 2023-06-03: qty 2

## 2023-06-03 MED ORDER — LIDOCAINE HCL (PF) 1 % IJ SOLN
INTRAMUSCULAR | Status: DC | PRN
  Administered 2023-06-03: 1 mL

## 2023-06-03 MED ORDER — PHENYLEPHRINE-KETOROLAC 1-0.3 % IO SOLN
INTRAOCULAR | Status: DC | PRN
  Administered 2023-06-03: 500 mL via OPHTHALMIC

## 2023-06-03 MED ORDER — SIGHTPATH DOSE#1 NA HYALUR & NA CHOND-NA HYALUR IO KIT
PACK | INTRAOCULAR | Status: DC | PRN
  Administered 2023-06-03: 1 via OPHTHALMIC

## 2023-06-03 MED ORDER — LIDOCAINE HCL 3.5 % OP GEL
1.0000 | Freq: Once | OPHTHALMIC | Status: AC
  Administered 2023-06-03: 1 via OPHTHALMIC

## 2023-06-03 SURGICAL SUPPLY — 15 items
CATARACT SUITE SIGHTPATH (MISCELLANEOUS) ×1 IMPLANT
CLOTH BEACON ORANGE TIMEOUT ST (SAFETY) ×1 IMPLANT
DRSG TEGADERM 4X4.75 (GAUZE/BANDAGES/DRESSINGS) ×1 IMPLANT
EYE SHIELD UNIVERSAL CLEAR (GAUZE/BANDAGES/DRESSINGS) IMPLANT
FEE CATARACT SUITE SIGHTPATH (MISCELLANEOUS) ×1 IMPLANT
GLOVE BIOGEL PI IND STRL 7.0 (GLOVE) ×2 IMPLANT
LENS IOL TECNIS EYHANCE 22.5 (Intraocular Lens) IMPLANT
NDL HYPO 18GX1.5 BLUNT FILL (NEEDLE) ×1 IMPLANT
NEEDLE HYPO 18GX1.5 BLUNT FILL (NEEDLE) ×1 IMPLANT
PAD ARMBOARD 7.5X6 YLW CONV (MISCELLANEOUS) ×1 IMPLANT
POSITIONER HEAD 8X9X4 ADT (SOFTGOODS) ×1 IMPLANT
RING MALYGIN 7.0 (MISCELLANEOUS) IMPLANT
SYR TB 1ML LL NO SAFETY (SYRINGE) ×1 IMPLANT
TAPE SURG TRANSPORE 1 IN (GAUZE/BANDAGES/DRESSINGS) IMPLANT
WATER STERILE IRR 250ML POUR (IV SOLUTION) ×1 IMPLANT

## 2023-06-03 NOTE — Anesthesia Preprocedure Evaluation (Signed)
Anesthesia Evaluation  Patient identified by MRN, date of birth, ID band Patient awake    Reviewed: Allergy & Precautions, H&P , NPO status , Patient's Chart, lab work & pertinent test results, reviewed documented beta blocker date and time   Airway Mallampati: II  TM Distance: >3 FB Neck ROM: full    Dental no notable dental hx.    Pulmonary neg pulmonary ROS, pneumonia, COPD, Current Smoker   Pulmonary exam normal breath sounds clear to auscultation       Cardiovascular Exercise Tolerance: Good negative cardio ROS + dysrhythmias  Rhythm:regular Rate:Normal     Neuro/Psych  PSYCHIATRIC DISORDERS  Depression     Neuromuscular disease negative neurological ROS  negative psych ROS   GI/Hepatic negative GI ROS, Neg liver ROS, hiatal hernia, PUD,GERD  ,,  Endo/Other  negative endocrine ROS    Renal/GU negative Renal ROS  negative genitourinary   Musculoskeletal   Abdominal   Peds  Hematology negative hematology ROS (+) Blood dyscrasia, anemia   Anesthesia Other Findings   Reproductive/Obstetrics negative OB ROS                             Anesthesia Physical Anesthesia Plan  ASA: 3  Anesthesia Plan: MAC   Post-op Pain Management:    Induction:   PONV Risk Score and Plan:   Airway Management Planned:   Additional Equipment:   Intra-op Plan:   Post-operative Plan:   Informed Consent: I have reviewed the patients History and Physical, chart, labs and discussed the procedure including the risks, benefits and alternatives for the proposed anesthesia with the patient or authorized representative who has indicated his/her understanding and acceptance.     Dental Advisory Given  Plan Discussed with: CRNA  Anesthesia Plan Comments:        Anesthesia Quick Evaluation

## 2023-06-03 NOTE — Discharge Instructions (Signed)
Please discharge patient when stable, will follow up today with Dr. Jadesola Poynter at the [facility] office immediately following discharge.  Leave shield in place until visit.  All paperwork with discharge instructions will be given at the office.  [facility] Address:  [address]  [place], [place] [address]  Dr. Mar Zettler's Phone: [phone]  

## 2023-06-03 NOTE — Interval H&P Note (Signed)
History and Physical Interval Note:  06/03/2023 7:26 AM  The H and P was reviewed and updated. The patient was examined.  No changes were found after exam.  The surgical eye was marked.  Kristine Roberts

## 2023-06-03 NOTE — Op Note (Signed)
Date of procedure: 06/03/23  Pre-operative diagnosis: Visually significant age-related nuclear cataract, Right Eye (H25.11)  Post-operative diagnosis: Visually significant age-related nuclear cataract, Right Eye  Procedure: Removal of cataract via phacoemulsification and insertion of intra-ocular lens J&J DIBOO +22.5D into the capsular bag of the Right Eye  Attending surgeon: Pecolia Ades, MD  Anesthesia: MAC, Topical Akten  Complications: None  Estimated Blood Loss: <14mL (minimal)  Specimens: None  Implants:  Implant Name Type Inv. Item Serial No. Manufacturer Lot No. LRB No. Used Action  LENS IOL TECNIS EYHANCE 22.5 - [id number] Intraocular Lens LENS IOL TECNIS EYHANCE 22.5  [facility] [id number] Right 1 Implanted    Indications:  Visually significant age-related cataract, Right Eye  Procedure:  The patient was seen and identified in the pre-operative area. The operative eye was identified and dilated.  The operative eye was marked.  Topical anesthesia was administered to the operative eye.     The patient was then to the operative suite and placed in the supine position.  A timeout was performed confirming the patient, procedure to be performed, and all other relevant information.   The patient's face was prepped and draped in the usual fashion for intra-ocular surgery.  A lid speculum was placed into the operative eye and the surgical microscope moved into place and focused.  A superotemporal paracentesis was created using a 20 gauge paracentesis blade.  BSS mixed with Omidria, followed by 1% lidocaine was injected into the anterior chamber.  Viscoelastic was injected into the anterior chamber.  A temporal clear-corneal main wound incision was created using a 2.49mm microkeratome.  A continuous curvilinear capsulorrhexis was initiated using an irrigating cystitome and completed using capsulorrhexis forceps.  Hydrodissection and hydrodeliniation were performed.  Viscoelastic was  injected into the anterior chamber.  A phacoemulsification handpiece and a chopper as a second instrument were used to remove the nucleus and epinucleus. The irrigation/aspiration handpiece was used to remove any remaining cortical material.   The capsular bag was reinflated with viscoelastic, checked, and found to be intact.  The intraocular lens was inserted into the capsular bag.  The irrigation/aspiration handpiece was used to remove any remaining viscoelastic.  The clear corneal wound and paracentesis wounds were then hydrated and checked with Weck-Cels to be watertight.  The lid-speculum and drape was removed, and the patient's face was cleaned with a wet and dry 4x4.  Maxitrol drops were instilled onto the eye. A clear shield was taped over the eye. The patient was taken to the post-operative care unit in good condition, having tolerated the procedure well.  Post-Op Instructions: The patient will follow up at [facility] for a same day post-operative evaluation and will receive all other orders and instructions.

## 2023-06-03 NOTE — Transfer of Care (Signed)
Immediate Anesthesia Transfer of Care Note  Patient: Kristine Roberts  Procedure(s) Performed: CATARACT EXTRACTION PHACO AND INTRAOCULAR LENS PLACEMENT (IOC) (Right: Eye)  Patient Location: Short Stay  Anesthesia Type:MAC  Level of Consciousness: awake and patient cooperative  Airway & Oxygen Therapy: Patient Spontanous Breathing  Post-op Assessment: Report given to RN and Post -op Vital signs reviewed and stable  Post vital signs: Reviewed and stable  Last Vitals:  Vitals Value Taken Time  BP 99/65 0824  Temp 98.6 0824  Pulse 57 0824  Resp 15 0824  SpO2 98 0824    Last Pain:  Vitals:   06/03/23 0721  PainSc: 0-No pain         Complications: No notable events documented.

## 2023-06-03 NOTE — Anesthesia Procedure Notes (Signed)
Date/Time: 06/03/2023 8:02 AM  Performed by: Franco Nones, CRNAPre-anesthesia Checklist: Patient identified, Emergency Drugs available, Suction available, Timeout performed and Patient being monitored Patient Re-evaluated:Patient Re-evaluated prior to induction Oxygen Delivery Method: Nasal Cannula

## 2023-06-06 NOTE — Anesthesia Postprocedure Evaluation (Signed)
Anesthesia Post Note  Patient: EEVEE WHITEHORSE  Procedure(s) Performed: CATARACT EXTRACTION PHACO AND INTRAOCULAR LENS PLACEMENT (IOC) (Right: Eye)  Patient location during evaluation: Phase II Anesthesia Type: MAC Level of consciousness: awake Pain management: pain level controlled Vital Signs Assessment: post-procedure vital signs reviewed and stable Respiratory status: spontaneous breathing and respiratory function stable Cardiovascular status: blood pressure returned to baseline and stable Postop Assessment: no headache and no apparent nausea or vomiting Anesthetic complications: no Comments: Late entry   No notable events documented.   Last Vitals:  Vitals:   06/03/23 0745 06/03/23 0823  BP: 127/79 99/65  Pulse: (!) 48 (!) 58  Resp: (!) 36 16  Temp:  37 C  SpO2: 99% 99%    Last Pain:  Vitals:   06/06/23 1023  TempSrc:   PainSc: 0-No pain                 Windell Norfolk

## 2023-06-07 ENCOUNTER — Encounter (HOSPITAL_COMMUNITY): Payer: Self-pay | Admitting: Optometry

## 2023-06-10 DIAGNOSIS — H2512 Age-related nuclear cataract, left eye: Secondary | ICD-10-CM | POA: Diagnosis not present

## 2023-06-13 NOTE — H&P (Addendum)
Surgical History & Physical  Patient Name: Kristine Roberts  DOB: [birth year]  Surgery: Cataract extraction with intraocular lens implant phacoemulsification; Left Eye Surgeon: Pecolia Ades MD Surgery Date: 06/17/2023 Pre-Op Date: 06/07/2023  HPI: A 38 Yr. old female patient present for 4 day post op OD. Patient is doing well, happy the surgery. Using Ciprofloxacin and Prednisolone QID OD. Difficulties reading fine print, recognizing peoples faces from a distance, poor night vision and glare problems during the day due to the cataract OS. This is negatively affecting the patient's quality of life and the patient is unable to function adequately in life with the current level of vision. Patient would like to proceed with cataract sx OS.  Medical History: Cataracts  Arthritis Lung Problems anxiety and depression disorder, insomnia, acid reflux  Review of Systems Psychiatry Anxiety, Depression All recorded systems are negative except as noted above.  Social Current every day smoker of Cigarettes   Medication Visine, Ciprofloxacin, Prednisolone acetate 1%,  Furosemide, Methotrexate, Pantoprazole, Sertraline, Trazodone  Sx/Procedures Phaco c IOL OD,  Broken hip repair - bilateral  Drug Allergies  Penicillin  History & Physical: Heent: cataract OS, PCL OD NECK: supple without bruits LUNGS: lungs clear to auscultation CV: regular rate and rhythm Abdomen: soft and non-tender  Impression & Plan: Assessment: 1.  CATARACT EXTRACTION STATUS; Right Eye (Z98.41) 2.  INTRAOCULAR LENS IOL ; Right Eye (Z96.1) 3.  CATARACT AGE-RELATED COMBINED FORMS; Left Eye (H25.812)  Plan: 1.  Cataracts are visually significant and account for the patient  2. See above   3.  Cataracts are visually significant and account for the patient's complaints. Discussed all risks, benefits, procedures and recovery, including infection, loss of vision and eye, need for glasses after surgery or additional  procedures. Patient understands changing glasses will not improve vision. Patient indicated understanding of procedure. All questions answered. Patient desires to have surgery, recommend phacoemulsification with intraocular lens. Patient to have preliminary testing necessary ([employer]os/IOL Master, Mac OCT, TOPO) Educational materials provided.  Plan: - Proceed with cataract surgery OS - DIB00 lens with best distance target - no prior eye surgeries - ok with lying flat - Has drusen which may be affecting vision somewhat - no fuchs, no DM

## 2023-06-14 ENCOUNTER — Encounter (HOSPITAL_COMMUNITY)
Admission: RE | Admit: 2023-06-14 | Discharge: 2023-06-14 | Disposition: A | Discharge status: Home / Self Care | Payer: Medicare HMO | Source: Ambulatory Visit | Attending: Optometry | Admitting: Optometry

## 2023-06-14 ENCOUNTER — Encounter (HOSPITAL_COMMUNITY): Payer: Self-pay

## 2023-06-17 ENCOUNTER — Ambulatory Visit (HOSPITAL_COMMUNITY): Payer: Medicare HMO | Admitting: Anesthesiology

## 2023-06-17 ENCOUNTER — Other Ambulatory Visit: Payer: Self-pay

## 2023-06-17 ENCOUNTER — Encounter (HOSPITAL_COMMUNITY): Payer: Self-pay | Admitting: Optometry

## 2023-06-17 ENCOUNTER — Encounter (HOSPITAL_COMMUNITY)
Admission: RE | Disposition: A | Discharge status: Home / Self Care | Payer: Self-pay | Source: Home / Self Care | Attending: Optometry

## 2023-06-17 ENCOUNTER — Ambulatory Visit (HOSPITAL_COMMUNITY)
Admission: RE | Admit: 2023-06-17 | Discharge: 2023-06-17 | Disposition: A | Discharge status: Home / Self Care | Payer: Medicare HMO | Attending: Optometry | Admitting: Optometry

## 2023-06-17 DIAGNOSIS — F17210 Nicotine dependence, cigarettes, uncomplicated: Secondary | ICD-10-CM | POA: Insufficient documentation

## 2023-06-17 DIAGNOSIS — Z9841 Cataract extraction status, right eye: Secondary | ICD-10-CM | POA: Diagnosis not present

## 2023-06-17 DIAGNOSIS — F32A Depression, unspecified: Secondary | ICD-10-CM

## 2023-06-17 DIAGNOSIS — Z961 Presence of intraocular lens: Secondary | ICD-10-CM | POA: Insufficient documentation

## 2023-06-17 DIAGNOSIS — H2512 Age-related nuclear cataract, left eye: Secondary | ICD-10-CM

## 2023-06-17 DIAGNOSIS — K449 Diaphragmatic hernia without obstruction or gangrene: Secondary | ICD-10-CM | POA: Diagnosis not present

## 2023-06-17 DIAGNOSIS — J449 Chronic obstructive pulmonary disease, unspecified: Secondary | ICD-10-CM | POA: Diagnosis not present

## 2023-06-17 DIAGNOSIS — H25812 Combined forms of age-related cataract, left eye: Secondary | ICD-10-CM | POA: Insufficient documentation

## 2023-06-17 DIAGNOSIS — K219 Gastro-esophageal reflux disease without esophagitis: Secondary | ICD-10-CM | POA: Insufficient documentation

## 2023-06-17 DIAGNOSIS — F17200 Nicotine dependence, unspecified, uncomplicated: Secondary | ICD-10-CM | POA: Diagnosis not present

## 2023-06-17 HISTORY — PX: CATARACT EXTRACTION W/PHACO: SHX586

## 2023-06-17 SURGERY — PHACOEMULSIFICATION, CATARACT, WITH IOL INSERTION
Anesthesia: Monitor Anesthesia Care | Site: Eye | Laterality: Left

## 2023-06-17 MED ORDER — MIDAZOLAM HCL 5 MG/5ML IJ SOLN
INTRAMUSCULAR | Status: DC | PRN
  Administered 2023-06-17: 1 mg via INTRAVENOUS

## 2023-06-17 MED ORDER — SIGHTPATH DOSE#1 NA HYALUR & NA CHOND-NA HYALUR IO KIT
PACK | INTRAOCULAR | Status: DC | PRN
  Administered 2023-06-17: 1 via OPHTHALMIC

## 2023-06-17 MED ORDER — MIDAZOLAM HCL 2 MG/2ML IJ SOLN
INTRAMUSCULAR | Status: AC
  Filled 2023-06-17: qty 2

## 2023-06-17 MED ORDER — LIDOCAINE HCL 3.5 % OP GEL
1.0000 | Freq: Once | OPHTHALMIC | Status: AC
  Administered 2023-06-17: 1 via OPHTHALMIC

## 2023-06-17 MED ORDER — LIDOCAINE HCL (PF) 1 % IJ SOLN
INTRAMUSCULAR | Status: DC | PRN
  Administered 2023-06-17: 1 mL

## 2023-06-17 MED ORDER — SODIUM CHLORIDE 0.9% FLUSH
INTRAVENOUS | Status: DC | PRN
  Administered 2023-06-17: 5 mL via INTRAVENOUS

## 2023-06-17 MED ORDER — LACTATED RINGERS IV SOLN: INTRAVENOUS | Status: DC

## 2023-06-17 MED ORDER — TROPICAMIDE 1 % OP SOLN
1.0000 [drp] | OPHTHALMIC | Status: AC | PRN
  Administered 2023-06-17 (×3): 1 [drp] via OPHTHALMIC

## 2023-06-17 MED ORDER — NEOMYCIN-POLYMYXIN-DEXAMETH 3.5-10000-0.1 OP SUSP
OPHTHALMIC | Status: DC | PRN
  Administered 2023-06-17: 2 [drp] via OPHTHALMIC

## 2023-06-17 MED ORDER — BSS IO SOLN
INTRAOCULAR | Status: DC | PRN
  Administered 2023-06-17: 15 mL via INTRAOCULAR

## 2023-06-17 MED ORDER — PHENYLEPHRINE HCL 2.5 % OP SOLN
1.0000 [drp] | OPHTHALMIC | Status: AC | PRN
  Administered 2023-06-17 (×3): 1 [drp] via OPHTHALMIC

## 2023-06-17 MED ORDER — STERILE WATER FOR IRRIGATION IR SOLN
Status: DC | PRN
  Administered 2023-06-17: 250 mL

## 2023-06-17 MED ORDER — TETRACAINE HCL 0.5 % OP SOLN
1.0000 [drp] | OPHTHALMIC | Status: AC | PRN
  Administered 2023-06-17 (×3): 1 [drp] via OPHTHALMIC

## 2023-06-17 MED ORDER — PHENYLEPHRINE-KETOROLAC 1-0.3 % IO SOLN
INTRAOCULAR | Status: DC | PRN
  Administered 2023-06-17: 500 mL via OPHTHALMIC

## 2023-06-17 MED ORDER — POVIDONE-IODINE 5 % OP SOLN
OPHTHALMIC | Status: DC | PRN
  Administered 2023-06-17: 1 via OPHTHALMIC

## 2023-06-17 SURGICAL SUPPLY — 14 items
CATARACT SUITE SIGHTPATH (MISCELLANEOUS) ×1 IMPLANT
CLOTH BEACON ORANGE TIMEOUT ST (SAFETY) ×1 IMPLANT
DRSG TEGADERM 4X4.75 (GAUZE/BANDAGES/DRESSINGS) ×1 IMPLANT
EYE SHIELD UNIVERSAL CLEAR (GAUZE/BANDAGES/DRESSINGS) IMPLANT
FEE CATARACT SUITE SIGHTPATH (MISCELLANEOUS) ×1 IMPLANT
GLOVE BIOGEL PI IND STRL 7.0 (GLOVE) ×2 IMPLANT
LENS IOL TECNIS EYHANCE 21.5 (Intraocular Lens) IMPLANT
NDL HYPO 18GX1.5 BLUNT FILL (NEEDLE) ×1 IMPLANT
NEEDLE HYPO 18GX1.5 BLUNT FILL (NEEDLE) ×1 IMPLANT
PAD ARMBOARD 7.5X6 YLW CONV (MISCELLANEOUS) ×1 IMPLANT
POSITIONER HEAD 8X9X4 ADT (SOFTGOODS) ×1 IMPLANT
SYR TB 1ML LL NO SAFETY (SYRINGE) ×1 IMPLANT
TAPE SURG TRANSPORE 1 IN (GAUZE/BANDAGES/DRESSINGS) IMPLANT
WATER STERILE IRR 250ML POUR (IV SOLUTION) ×1 IMPLANT

## 2023-06-17 NOTE — Transfer of Care (Signed)
Immediate Anesthesia Transfer of Care Note  Patient: Kristine Roberts  Procedure(s) Performed: CATARACT EXTRACTION PHACO AND INTRAOCULAR LENS PLACEMENT (IOC) (Left: Eye)  Patient Location: Short Stay  Anesthesia Type:MAC  Level of Consciousness: awake  Airway & Oxygen Therapy: Patient Spontanous Breathing  Post-op Assessment: Report given to RN  Post vital signs: Reviewed and stable  Last Vitals:  Vitals Value Taken Time  BP 112/65 06/17/23 1051  Temp 36.7 C 06/17/23 1051  Pulse 94 06/17/23 1051  Resp 16 06/17/23 1051  SpO2 100 % 06/17/23 1051    Last Pain:  Vitals:   06/17/23 1051  TempSrc:   PainSc: 0-No pain      Patients Stated Pain Goal: 4 (06/17/23 0948)  Complications: No notable events documented.

## 2023-06-17 NOTE — Discharge Instructions (Signed)
Please discharge patient when stable, will follow up today with Dr. Vipul Cafarelli at the [facility] [place] office immediately following discharge.  Leave shield in place until visit.  All paperwork with discharge instructions will be given at the office.  [facility] Address:  [address]  [place], [place] [address]  Dr. Taunja Brickner's Phone: [phone]  

## 2023-06-17 NOTE — H&P (Signed)
The H and P was reviewed and updated. The patient was examined.  No changes were found after exam.  The surgical eye was marked.  Performed on 06/03/23

## 2023-06-17 NOTE — Interval H&P Note (Signed)
History and Physical Interval Note:  06/17/2023 9:59 AM  The H and P was reviewed and updated. The patient was examined.  No changes were found after exam.  The surgical eye was marked.  Kristine Roberts

## 2023-06-17 NOTE — Anesthesia Postprocedure Evaluation (Signed)
Anesthesia Post Note  Patient: Kristine Roberts  Procedure(s) Performed: CATARACT EXTRACTION PHACO AND INTRAOCULAR LENS PLACEMENT (IOC) (Left: Eye)  Patient location during evaluation: Short Stay Anesthesia Type: MAC Level of consciousness: awake and alert Pain management: pain level controlled Vital Signs Assessment: post-procedure vital signs reviewed and stable Respiratory status: spontaneous breathing Cardiovascular status: blood pressure returned to baseline and stable Postop Assessment: no apparent nausea or vomiting Anesthetic complications: no   No notable events documented.   Last Vitals:  Vitals:   06/17/23 0948 06/17/23 1051  BP: 127/73 112/65  Pulse: (!) 47 94  Resp: (!) 32 16  Temp: 36.8 C 36.7 C  SpO2: 97% 100%    Last Pain:  Vitals:   06/17/23 1051  TempSrc:   PainSc: 0-No pain                 Keileigh Vahey

## 2023-06-17 NOTE — Op Note (Signed)
Date of procedure: 06/17/23  Pre-operative diagnosis: Visually significant age-related nuclear cataract, Left Eye (H25.12)  Post-operative diagnosis: Visually significant age-related nuclear cataract, Left Eye  Procedure: Removal of cataract via phacoemulsification and insertion of intra-ocular lens J&J DIB00 +21.5D into the capsular bag of the Left Eye  Attending surgeon: Ronal Fear, MD  Anesthesia: MAC, Topical Akten  Complications: None  Estimated Blood Loss: <67mL (minimal)  Specimens: None  Implants:  Implant Name Type Inv. Item Serial No. Manufacturer Lot No. LRB No. Used Action  LENS IOL TECNIS EYHANCE 21.5 - [id number] Intraocular Lens LENS IOL TECNIS EYHANCE 21.5 [id number] [employer]  Left 1 Implanted    Indications:  Visually significant age-related cataract, Left Eye  Procedure:  The patient was seen and identified in the pre-operative area. The operative eye was identified and dilated.  The operative eye was marked.  Topical anesthesia was administered to the operative eye.     The patient was then to the operative suite and placed in the supine position.  A timeout was performed confirming the patient, procedure to be performed, and all other relevant information.   The patient's face was prepped and draped in the usual fashion for intra-ocular surgery.  A lid speculum was placed into the operative eye and the surgical microscope moved into place and focused.  An inferotemporal paracentesis was created using a 20 gauge paracentesis blade.  BSS mixed with Omidria, followed by 1% lidocaine was injected into the anterior chamber.  Viscoelastic was injected into the anterior chamber.  A temporal clear-corneal main wound incision was created using a 2.31mm microkeratome.  A continuous curvilinear capsulorrhexis was initiated using an irrigating cystitome and completed using capsulorrhexis forceps.  Hydrodissection and hydrodeliniation were performed.  Viscoelastic was  injected into the anterior chamber.  A phacoemulsification handpiece and a chopper as a second instrument were used to remove the nucleus and epinucleus. The irrigation/aspiration handpiece was used to remove any remaining cortical material.   The capsular bag was reinflated with viscoelastic, checked, and found to be intact.  The intraocular lens was inserted into the capsular bag.  The irrigation/aspiration handpiece was used to remove any remaining viscoelastic.  The clear corneal wound and paracentesis wounds were then hydrated and checked with Weck-Cels to be watertight.  The lid-speculum and drape was removed, and the patient's face was cleaned with a wet and dry 4x4.  Maxitrol drops were instilled onto the eye. A clear shield was taped over the eye. The patient was taken to the post-operative care unit in good condition, having tolerated the procedure well.  Post-Op Instructions: The patient will follow up at [facility] for a same day post-operative evaluation and will receive all other orders and instructions.

## 2023-06-17 NOTE — Anesthesia Preprocedure Evaluation (Signed)
Anesthesia Evaluation  Patient identified by MRN, date of birth, ID band Patient awake    Reviewed: Allergy & Precautions, H&P , NPO status , Patient's Chart, lab work & pertinent test results  Airway Mallampati: II  TM Distance: >3 FB Neck ROM: Full    Dental  (+) Edentulous Upper, Edentulous Lower   Pulmonary pneumonia, COPD,  COPD inhaler, Current Smoker and Patient abstained from smoking.   Pulmonary exam normal breath sounds clear to auscultation       Cardiovascular Normal cardiovascular exam+ dysrhythmias  Rhythm:Regular Rate:Normal     Neuro/Psych  PSYCHIATRIC DISORDERS  Depression     Neuromuscular disease    GI/Hepatic Neg liver ROS, hiatal hernia, PUD,GERD  Medicated and Controlled,,  Endo/Other  negative endocrine ROS    Renal/GU negative Renal ROS  negative genitourinary   Musculoskeletal  (+) Arthritis , Osteoarthritis,    Abdominal   Peds negative pediatric ROS (+)  Hematology  (+) Blood dyscrasia, anemia   Anesthesia Other Findings   Reproductive/Obstetrics negative OB ROS                             Anesthesia Physical Anesthesia Plan  ASA: 3  Anesthesia Plan: MAC   Post-op Pain Management: Minimal or no pain anticipated   Induction: Intravenous  PONV Risk Score and Plan: 0 and Treatment may vary due to age or medical condition  Airway Management Planned: Nasal Cannula and Natural Airway  Additional Equipment:   Intra-op Plan:   Post-operative Plan:   Informed Consent: I have reviewed the patients History and Physical, chart, labs and discussed the procedure including the risks, benefits and alternatives for the proposed anesthesia with the patient or authorized representative who has indicated his/her understanding and acceptance.       Plan Discussed with: CRNA and Surgeon  Anesthesia Plan Comments:         Anesthesia Quick Evaluation

## 2023-06-22 ENCOUNTER — Encounter (HOSPITAL_COMMUNITY): Payer: Self-pay | Admitting: Optometry

## 2023-07-07 DIAGNOSIS — Z7189 Other specified counseling: Secondary | ICD-10-CM | POA: Diagnosis not present

## 2023-07-07 DIAGNOSIS — Z299 Encounter for prophylactic measures, unspecified: Secondary | ICD-10-CM | POA: Diagnosis not present

## 2023-07-07 DIAGNOSIS — M06049 Rheumatoid arthritis without rheumatoid factor, unspecified hand: Secondary | ICD-10-CM | POA: Diagnosis not present

## 2023-07-07 DIAGNOSIS — Z1331 Encounter for screening for depression: Secondary | ICD-10-CM | POA: Diagnosis not present

## 2023-07-07 DIAGNOSIS — Z1339 Encounter for screening examination for other mental health and behavioral disorders: Secondary | ICD-10-CM | POA: Diagnosis not present

## 2023-07-07 DIAGNOSIS — E538 Deficiency of other specified B group vitamins: Secondary | ICD-10-CM | POA: Diagnosis not present

## 2023-07-07 DIAGNOSIS — Z0000 Encounter for general adult medical examination without abnormal findings: Secondary | ICD-10-CM | POA: Diagnosis not present

## 2023-07-07 DIAGNOSIS — J449 Chronic obstructive pulmonary disease, unspecified: Secondary | ICD-10-CM | POA: Diagnosis not present

## 2023-07-07 DIAGNOSIS — I700 Atherosclerosis of aorta: Secondary | ICD-10-CM | POA: Diagnosis not present

## 2023-08-17 DIAGNOSIS — H524 Presbyopia: Secondary | ICD-10-CM | POA: Diagnosis not present

## 2024-04-12 DIAGNOSIS — E538 Deficiency of other specified B group vitamins: Secondary | ICD-10-CM | POA: Diagnosis not present

## 2024-09-12 DIAGNOSIS — Z79899 Other long term (current) drug therapy: Secondary | ICD-10-CM | POA: Diagnosis not present

## 2024-09-12 DIAGNOSIS — R5383 Other fatigue: Secondary | ICD-10-CM | POA: Diagnosis not present

## 2024-09-12 DIAGNOSIS — E7800 Pure hypercholesterolemia, unspecified: Secondary | ICD-10-CM | POA: Diagnosis not present

## 2024-10-10 ENCOUNTER — Encounter (INDEPENDENT_AMBULATORY_CARE_PROVIDER_SITE_OTHER): Payer: Self-pay | Admitting: Gastroenterology
